# Patient Record
Sex: Male | Born: 1950 | Race: Black or African American | Hispanic: No | Marital: Married | Smoking: Former smoker
Health system: Southern US, Community
[De-identification: ages and names within clinical notes are randomized; demographics above are authoritative.]

## PROBLEM LIST (undated history)

## (undated) DIAGNOSIS — E119 Type 2 diabetes mellitus without complications: Secondary | ICD-10-CM

## (undated) DIAGNOSIS — T148XXA Other injury of unspecified body region, initial encounter: Secondary | ICD-10-CM

## (undated) DIAGNOSIS — D649 Anemia, unspecified: Secondary | ICD-10-CM

## (undated) DIAGNOSIS — E440 Moderate protein-calorie malnutrition: Secondary | ICD-10-CM

## (undated) DIAGNOSIS — N189 Chronic kidney disease, unspecified: Secondary | ICD-10-CM

## (undated) DIAGNOSIS — R197 Diarrhea, unspecified: Secondary | ICD-10-CM

## (undated) DIAGNOSIS — K30 Functional dyspepsia: Secondary | ICD-10-CM

## (undated) DIAGNOSIS — C9000 Multiple myeloma not having achieved remission: Secondary | ICD-10-CM

## (undated) DIAGNOSIS — F1910 Other psychoactive substance abuse, uncomplicated: Secondary | ICD-10-CM

## (undated) DIAGNOSIS — M719 Bursopathy, unspecified: Secondary | ICD-10-CM

## (undated) DIAGNOSIS — R569 Unspecified convulsions: Secondary | ICD-10-CM

## (undated) DIAGNOSIS — T68XXXA Hypothermia, initial encounter: Secondary | ICD-10-CM

## (undated) DIAGNOSIS — N179 Acute kidney failure, unspecified: Secondary | ICD-10-CM

## (undated) DIAGNOSIS — B351 Tinea unguium: Secondary | ICD-10-CM

## (undated) DIAGNOSIS — E1151 Type 2 diabetes mellitus with diabetic peripheral angiopathy without gangrene: Secondary | ICD-10-CM

## (undated) DIAGNOSIS — N183 Chronic kidney disease, stage 3 (moderate): Secondary | ICD-10-CM

## (undated) DIAGNOSIS — Z89519 Acquired absence of unspecified leg below knee: Secondary | ICD-10-CM

## (undated) DIAGNOSIS — C801 Malignant (primary) neoplasm, unspecified: Secondary | ICD-10-CM

## (undated) DIAGNOSIS — G9340 Encephalopathy, unspecified: Secondary | ICD-10-CM

## (undated) DIAGNOSIS — I509 Heart failure, unspecified: Secondary | ICD-10-CM

## (undated) DIAGNOSIS — Z8619 Personal history of other infectious and parasitic diseases: Secondary | ICD-10-CM

## (undated) DIAGNOSIS — I10 Essential (primary) hypertension: Secondary | ICD-10-CM

## (undated) DIAGNOSIS — R809 Proteinuria, unspecified: Secondary | ICD-10-CM

## (undated) DIAGNOSIS — K5792 Diverticulitis of intestine, part unspecified, without perforation or abscess without bleeding: Secondary | ICD-10-CM

## (undated) DIAGNOSIS — Z87442 Personal history of urinary calculi: Secondary | ICD-10-CM

## (undated) DIAGNOSIS — G546 Phantom limb syndrome with pain: Secondary | ICD-10-CM

## (undated) DIAGNOSIS — Q8901 Asplenia (congenital): Secondary | ICD-10-CM

## (undated) DIAGNOSIS — R7989 Other specified abnormal findings of blood chemistry: Secondary | ICD-10-CM

## (undated) DIAGNOSIS — L089 Local infection of the skin and subcutaneous tissue, unspecified: Secondary | ICD-10-CM

## (undated) DIAGNOSIS — M1990 Unspecified osteoarthritis, unspecified site: Secondary | ICD-10-CM

## (undated) HISTORY — DX: Acute kidney failure, unspecified: N17.9

## (undated) HISTORY — DX: Essential (primary) hypertension: I10

## (undated) HISTORY — DX: Local infection of the skin and subcutaneous tissue, unspecified: L08.9

## (undated) HISTORY — DX: Encephalopathy, unspecified: G93.40

## (undated) HISTORY — DX: Diverticulitis of intestine, part unspecified, without perforation or abscess without bleeding: K57.92

## (undated) HISTORY — DX: Type 2 diabetes mellitus without complications: E11.9

## (undated) HISTORY — DX: Chronic kidney disease, unspecified: N18.9

## (undated) HISTORY — DX: Chronic kidney disease, stage 3 (moderate): N18.3

## (undated) HISTORY — PX: COLONOSCOPY W/ POLYPECTOMY: SHX1380

## (undated) HISTORY — DX: Personal history of other infectious and parasitic diseases: Z86.19

## (undated) HISTORY — DX: Tinea unguium: B35.1

## (undated) HISTORY — DX: Phantom limb syndrome with pain: G54.6

## (undated) HISTORY — DX: Proteinuria, unspecified: R80.9

## (undated) HISTORY — DX: Other specified abnormal findings of blood chemistry: R79.89

## (undated) HISTORY — DX: Type 2 diabetes mellitus with diabetic peripheral angiopathy without gangrene: E11.51

## (undated) HISTORY — DX: Acquired absence of unspecified leg below knee: Z89.519

## (undated) HISTORY — DX: Heart failure, unspecified: I50.9

## (undated) HISTORY — DX: Other injury of unspecified body region, initial encounter: T14.8XXA

## (undated) HISTORY — DX: Moderate protein-calorie malnutrition: E44.0

## (undated) HISTORY — DX: Hypothermia, initial encounter: T68.XXXA

## (undated) HISTORY — PX: SPLENECTOMY: SUR1306

## (undated) HISTORY — DX: Other psychoactive substance abuse, uncomplicated: F19.10

## (undated) HISTORY — DX: Anemia, unspecified: D64.9

---

## 1987-08-31 HISTORY — PX: COLON SURGERY: SHX602

## 1998-04-03 ENCOUNTER — Emergency Department (HOSPITAL_COMMUNITY): Admission: EM | Admit: 1998-04-03 | Discharge: 1998-04-03 | Payer: Self-pay

## 1998-04-11 ENCOUNTER — Encounter: Admission: RE | Admit: 1998-04-11 | Discharge: 1998-07-10 | Payer: Self-pay | Admitting: Endocrinology

## 1999-07-25 ENCOUNTER — Encounter: Payer: Self-pay | Admitting: Emergency Medicine

## 1999-07-25 ENCOUNTER — Emergency Department (HOSPITAL_COMMUNITY): Admission: EM | Admit: 1999-07-25 | Discharge: 1999-07-25 | Payer: Self-pay | Admitting: Emergency Medicine

## 2002-08-13 ENCOUNTER — Emergency Department (HOSPITAL_COMMUNITY): Admission: EM | Admit: 2002-08-13 | Discharge: 2002-08-13 | Payer: Self-pay | Admitting: Emergency Medicine

## 2005-03-09 ENCOUNTER — Inpatient Hospital Stay (HOSPITAL_COMMUNITY): Admission: EM | Admit: 2005-03-09 | Discharge: 2005-03-11 | Payer: Self-pay | Admitting: Emergency Medicine

## 2013-08-30 DIAGNOSIS — R569 Unspecified convulsions: Secondary | ICD-10-CM

## 2013-08-30 HISTORY — DX: Unspecified convulsions: R56.9

## 2013-09-26 ENCOUNTER — Inpatient Hospital Stay (HOSPITAL_COMMUNITY)
Admission: AD | Admit: 2013-09-26 | Discharge: 2013-10-09 | DRG: 853 | Disposition: A | Discharge status: Rehabilitation Facility | Payer: Medicaid Other | Source: Ambulatory Visit | Attending: Internal Medicine | Admitting: Internal Medicine

## 2013-09-26 ENCOUNTER — Ambulatory Visit: Payer: Self-pay | Admitting: Family Medicine

## 2013-09-26 VITALS — BP 112/58 | HR 105 | Temp 102.9°F | Resp 16 | Ht 73.0 in | Wt 206.0 lb

## 2013-09-26 DIAGNOSIS — E1159 Type 2 diabetes mellitus with other circulatory complications: Secondary | ICD-10-CM | POA: Diagnosis present

## 2013-09-26 DIAGNOSIS — G5790 Unspecified mononeuropathy of unspecified lower limb: Secondary | ICD-10-CM | POA: Diagnosis present

## 2013-09-26 DIAGNOSIS — E1142 Type 2 diabetes mellitus with diabetic polyneuropathy: Secondary | ICD-10-CM | POA: Diagnosis present

## 2013-09-26 DIAGNOSIS — L0291 Cutaneous abscess, unspecified: Secondary | ICD-10-CM

## 2013-09-26 DIAGNOSIS — M795 Residual foreign body in soft tissue: Secondary | ICD-10-CM | POA: Diagnosis present

## 2013-09-26 DIAGNOSIS — F17200 Nicotine dependence, unspecified, uncomplicated: Secondary | ICD-10-CM | POA: Diagnosis present

## 2013-09-26 DIAGNOSIS — IMO0002 Reserved for concepts with insufficient information to code with codable children: Secondary | ICD-10-CM

## 2013-09-26 DIAGNOSIS — M726 Necrotizing fasciitis: Secondary | ICD-10-CM | POA: Diagnosis present

## 2013-09-26 DIAGNOSIS — L03119 Cellulitis of unspecified part of limb: Secondary | ICD-10-CM

## 2013-09-26 DIAGNOSIS — E1165 Type 2 diabetes mellitus with hyperglycemia: Secondary | ICD-10-CM

## 2013-09-26 DIAGNOSIS — E1149 Type 2 diabetes mellitus with other diabetic neurological complication: Secondary | ICD-10-CM | POA: Diagnosis present

## 2013-09-26 DIAGNOSIS — L0390 Cellulitis, unspecified: Secondary | ICD-10-CM

## 2013-09-26 DIAGNOSIS — D638 Anemia in other chronic diseases classified elsewhere: Secondary | ICD-10-CM | POA: Diagnosis present

## 2013-09-26 DIAGNOSIS — E876 Hypokalemia: Secondary | ICD-10-CM

## 2013-09-26 DIAGNOSIS — A419 Sepsis, unspecified organism: Principal | ICD-10-CM

## 2013-09-26 DIAGNOSIS — L02612 Cutaneous abscess of left foot: Secondary | ICD-10-CM

## 2013-09-26 DIAGNOSIS — R509 Fever, unspecified: Secondary | ICD-10-CM

## 2013-09-26 DIAGNOSIS — D72829 Elevated white blood cell count, unspecified: Secondary | ICD-10-CM

## 2013-09-26 DIAGNOSIS — L02619 Cutaneous abscess of unspecified foot: Secondary | ICD-10-CM

## 2013-09-26 DIAGNOSIS — I10 Essential (primary) hypertension: Secondary | ICD-10-CM | POA: Diagnosis present

## 2013-09-26 DIAGNOSIS — E46 Unspecified protein-calorie malnutrition: Secondary | ICD-10-CM | POA: Diagnosis present

## 2013-09-26 DIAGNOSIS — D649 Anemia, unspecified: Secondary | ICD-10-CM

## 2013-09-26 DIAGNOSIS — Z8719 Personal history of other diseases of the digestive system: Secondary | ICD-10-CM

## 2013-09-26 DIAGNOSIS — Z1810 Retained metal fragments, unspecified: Secondary | ICD-10-CM

## 2013-09-26 DIAGNOSIS — E119 Type 2 diabetes mellitus without complications: Secondary | ICD-10-CM

## 2013-09-26 DIAGNOSIS — IMO0001 Reserved for inherently not codable concepts without codable children: Secondary | ICD-10-CM | POA: Diagnosis present

## 2013-09-26 DIAGNOSIS — Z79899 Other long term (current) drug therapy: Secondary | ICD-10-CM

## 2013-09-26 DIAGNOSIS — G5690 Unspecified mononeuropathy of unspecified upper limb: Secondary | ICD-10-CM | POA: Diagnosis present

## 2013-09-26 DIAGNOSIS — E118 Type 2 diabetes mellitus with unspecified complications: Secondary | ICD-10-CM

## 2013-09-26 LAB — POCT CBC
Granulocyte percent: 88.6 %G — AB (ref 37–80)
HCT, POC: 34.4 % — AB (ref 43.5–53.7)
Hemoglobin: 10.4 g/dL — AB (ref 14.1–18.1)
Lymph, poc: 1.8 (ref 0.6–3.4)
MCH, POC: 29.5 pg (ref 27–31.2)
MCHC: 30.2 g/dL — AB (ref 31.8–35.4)
MCV: 97.5 fL — AB (ref 80–97)
MID (cbc): 1.3 — AB (ref 0–0.9)
MPV: 8.8 fL (ref 0–99.8)
POC Granulocyte: 23.7 — AB (ref 2–6.9)
POC LYMPH PERCENT: 6.6 %L — AB (ref 10–50)
POC MID %: 4.8 %M (ref 0–12)
Platelet Count, POC: 409 10*3/uL (ref 142–424)
RBC: 3.53 M/uL — AB (ref 4.69–6.13)
RDW, POC: 12.8 %
WBC: 26.8 10*3/uL — AB (ref 4.6–10.2)

## 2013-09-26 LAB — BASIC METABOLIC PANEL
BUN: 14 mg/dL (ref 6–23)
CO2: 25 mEq/L (ref 19–32)
Calcium: 8.5 mg/dL (ref 8.4–10.5)
Chloride: 92 mEq/L — ABNORMAL LOW (ref 96–112)
Creatinine, Ser: 0.81 mg/dL (ref 0.50–1.35)
GFR calc Af Amer: >90 mL/min (ref >90)
GFR calc non Af Amer: >90 mL/min (ref >90)
Glucose, Bld: 336 mg/dL — ABNORMAL HIGH (ref 70–99)
Potassium: 3.4 mEq/L — ABNORMAL LOW (ref 3.7–5.3)
Sodium: 134 mEq/L — ABNORMAL LOW (ref 137–147)

## 2013-09-26 LAB — CBC
HCT: 33.3 % — ABNORMAL LOW (ref 39.0–52.0)
Hemoglobin: 11.3 g/dL — ABNORMAL LOW (ref 13.0–17.0)
MCH: 31 pg (ref 26.0–34.0)
MCHC: 33.9 g/dL (ref 30.0–36.0)
MCV: 91.2 fL (ref 78.0–100.0)
Platelets: 463 10*3/uL — ABNORMAL HIGH (ref 150–400)
RBC: 3.65 MIL/uL — ABNORMAL LOW (ref 4.22–5.81)
RDW: 13 % (ref 11.5–15.5)
WBC: 34.5 10*3/uL — ABNORMAL HIGH (ref 4.0–10.5)

## 2013-09-26 LAB — PROTIME-INR
INR: 1.09 (ref 0.00–1.49)
Prothrombin Time: 13.9 seconds (ref 11.6–15.2)

## 2013-09-26 LAB — LACTIC ACID, PLASMA: Lactic Acid, Venous: 1.4 mmol/L (ref 0.5–2.2)

## 2013-09-26 LAB — GLUCOSE, CAPILLARY: Glucose-Capillary: 310 mg/dL — ABNORMAL HIGH (ref 70–99)

## 2013-09-26 LAB — GLUCOSE, POCT (MANUAL RESULT ENTRY): POC Glucose: 359 mg/dl — AB (ref 70–99)

## 2013-09-26 MED ORDER — INSULIN ASPART 100 UNIT/ML ~~LOC~~ SOLN
0.0000 [IU] | Freq: Three times a day (TID) | SUBCUTANEOUS | Status: DC
  Administered 2013-09-27: 5 [IU] via SUBCUTANEOUS
  Administered 2013-09-27: 3 [IU] via SUBCUTANEOUS
  Administered 2013-09-27: 8 [IU] via SUBCUTANEOUS
  Administered 2013-09-28: 3 [IU] via SUBCUTANEOUS
  Administered 2013-09-28: 5 [IU] via SUBCUTANEOUS
  Administered 2013-09-28: 2 [IU] via SUBCUTANEOUS
  Administered 2013-09-29: 3 [IU] via SUBCUTANEOUS
  Administered 2013-09-29 – 2013-09-30 (×3): 5 [IU] via SUBCUTANEOUS
  Administered 2013-09-30: 3 [IU] via SUBCUTANEOUS
  Administered 2013-09-30: 8 [IU] via SUBCUTANEOUS
  Administered 2013-10-01: 2 [IU] via SUBCUTANEOUS
  Administered 2013-10-01: 5 [IU] via SUBCUTANEOUS
  Administered 2013-10-01: 2 [IU] via SUBCUTANEOUS
  Administered 2013-10-02: 3 [IU] via SUBCUTANEOUS
  Administered 2013-10-03 – 2013-10-08 (×6): 2 [IU] via SUBCUTANEOUS
  Administered 2013-10-08 – 2013-10-09 (×3): 3 [IU] via SUBCUTANEOUS

## 2013-09-26 MED ORDER — ALUM & MAG HYDROXIDE-SIMETH 200-200-20 MG/5ML PO SUSP
30.0000 mL | Freq: Four times a day (QID) | ORAL | Status: DC | PRN
  Administered 2013-10-06 – 2013-10-08 (×4): 30 mL via ORAL
  Filled 2013-09-26 (×4): qty 30

## 2013-09-26 MED ORDER — INSULIN GLARGINE 100 UNIT/ML ~~LOC~~ SOLN
20.0000 [IU] | Freq: Every day | SUBCUTANEOUS | Status: DC
Start: 2013-09-26 — End: 2013-09-29
  Administered 2013-09-26 – 2013-09-28 (×3): 20 [IU] via SUBCUTANEOUS
  Filled 2013-09-26 (×4): qty 0.2

## 2013-09-26 MED ORDER — BISACODYL 10 MG RE SUPP: 10.0000 mg | Freq: Every day | RECTAL | Status: DC | PRN

## 2013-09-26 MED ORDER — SODIUM CHLORIDE 0.9 % IV SOLN
INTRAVENOUS | Status: DC
  Administered 2013-09-26 – 2013-09-29 (×4): via INTRAVENOUS

## 2013-09-26 MED ORDER — ONDANSETRON HCL 4 MG PO TABS: 4.0000 mg | ORAL_TABLET | Freq: Four times a day (QID) | ORAL | Status: DC | PRN

## 2013-09-26 MED ORDER — PIPERACILLIN-TAZOBACTAM 3.375 G IVPB
3.3750 g | Freq: Three times a day (TID) | INTRAVENOUS | Status: DC
  Administered 2013-09-27 (×2): 3.375 g via INTRAVENOUS
  Filled 2013-09-26 (×3): qty 50

## 2013-09-26 MED ORDER — OXYCODONE HCL 5 MG PO TABS
5.0000 mg | ORAL_TABLET | ORAL | Status: DC | PRN
  Administered 2013-09-28 – 2013-10-09 (×38): 5 mg via ORAL
  Filled 2013-09-26 (×37): qty 1

## 2013-09-26 MED ORDER — ONDANSETRON HCL 4 MG/2ML IJ SOLN
4.0000 mg | Freq: Four times a day (QID) | INTRAMUSCULAR | Status: DC | PRN
  Administered 2013-09-29 – 2013-10-04 (×2): 4 mg via INTRAVENOUS
  Filled 2013-09-26 (×2): qty 2

## 2013-09-26 MED ORDER — ACETAMINOPHEN 650 MG RE SUPP: 650.0000 mg | Freq: Four times a day (QID) | RECTAL | Status: DC | PRN

## 2013-09-26 MED ORDER — ACETAMINOPHEN 325 MG PO TABS
650.0000 mg | ORAL_TABLET | Freq: Four times a day (QID) | ORAL | Status: DC | PRN
  Administered 2013-09-26 – 2013-09-28 (×2): 650 mg via ORAL
  Filled 2013-09-26 (×2): qty 2

## 2013-09-26 MED ORDER — INSULIN ASPART 100 UNIT/ML ~~LOC~~ SOLN
0.0000 [IU] | Freq: Every day | SUBCUTANEOUS | Status: DC
  Administered 2013-09-26: 4 [IU] via SUBCUTANEOUS
  Administered 2013-09-28: 3 [IU] via SUBCUTANEOUS

## 2013-09-26 MED ORDER — MORPHINE SULFATE 2 MG/ML IJ SOLN
2.0000 mg | INTRAMUSCULAR | Status: DC | PRN
  Administered 2013-09-27 – 2013-10-09 (×21): 2 mg via INTRAVENOUS
  Filled 2013-09-26 (×21): qty 1

## 2013-09-26 MED ORDER — ENOXAPARIN SODIUM 40 MG/0.4ML ~~LOC~~ SOLN
40.0000 mg | SUBCUTANEOUS | Status: DC
Start: 2013-09-27 — End: 2013-09-27
  Filled 2013-09-26: qty 0.4

## 2013-09-26 MED ORDER — HYDROMORPHONE HCL PF 1 MG/ML IJ SOLN: 1.0000 mg | Freq: Once | INTRAMUSCULAR | Status: DC
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
## 2013-09-26 NOTE — Patient Instructions (Addendum)
Go directly to [facility] for admission with the [facility] (Dr. Coralyn Pear), room 1506, on [address]. Follow the signs to Admissions. There is free valet parking, and they will bring a wheelchair if you let them know he needs one. Go to the admissions desk and tell them he is there for direct admission.

## 2013-09-26 NOTE — Progress Notes (Signed)
Case discussed w/ Dellis Filbert - agree that pt needs hospitalization for sepsis from cellulitis and uncontrolled DM.  However, is clinically stable so agree w/ transfer by private vehicle for direct admission - accepted to med surg bed by [facility].  Reviewed documentation and agree w/ assessment and plan. Delman Cheadle, MD MPH
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
## 2013-09-27 ENCOUNTER — Encounter (HOSPITAL_COMMUNITY): Payer: Self-pay | Admitting: *Deleted

## 2013-09-27 ENCOUNTER — Encounter (HOSPITAL_COMMUNITY)
Admission: AD | Disposition: A | Discharge status: Rehabilitation Facility | Payer: Self-pay | Source: Ambulatory Visit | Attending: Internal Medicine

## 2013-09-27 ENCOUNTER — Inpatient Hospital Stay (HOSPITAL_COMMUNITY): Payer: Medicaid Other

## 2013-09-27 ENCOUNTER — Encounter (HOSPITAL_COMMUNITY): Payer: Medicaid Other | Admitting: Anesthesiology

## 2013-09-27 ENCOUNTER — Inpatient Hospital Stay (HOSPITAL_COMMUNITY): Payer: Medicaid Other | Admitting: Anesthesiology

## 2013-09-27 DIAGNOSIS — E876 Hypokalemia: Secondary | ICD-10-CM | POA: Diagnosis present

## 2013-09-27 HISTORY — PX: I & D EXTREMITY: SHX5045

## 2013-09-27 LAB — HEMOGLOBIN A1C
Hgb A1c MFr Bld: 13.7 % — ABNORMAL HIGH (ref <5.7)
Mean Plasma Glucose: 346 mg/dL — ABNORMAL HIGH (ref <117)

## 2013-09-27 LAB — COMPREHENSIVE METABOLIC PANEL
ALT: 26 U/L (ref 0–53)
AST: 22 U/L (ref 0–37)
Albumin: 2.1 g/dL — ABNORMAL LOW (ref 3.5–5.2)
Alkaline Phosphatase: 182 U/L — ABNORMAL HIGH (ref 39–117)
BUN: 15 mg/dL (ref 6–23)
CO2: 26 mEq/L (ref 19–32)
Calcium: 8.5 mg/dL (ref 8.4–10.5)
Chloride: 93 mEq/L — ABNORMAL LOW (ref 96–112)
Creatinine, Ser: 0.94 mg/dL (ref 0.50–1.35)
GFR calc Af Amer: >90 mL/min (ref >90)
GFR calc non Af Amer: 88 mL/min — ABNORMAL LOW (ref >90)
Glucose, Bld: 310 mg/dL — ABNORMAL HIGH (ref 70–99)
Potassium: 3.3 mEq/L — ABNORMAL LOW (ref 3.7–5.3)
Sodium: 134 mEq/L — ABNORMAL LOW (ref 137–147)
Total Bilirubin: 0.5 mg/dL (ref 0.3–1.2)
Total Protein: 6.6 g/dL (ref 6.0–8.3)

## 2013-09-27 LAB — GLUCOSE, CAPILLARY
Glucose-Capillary: 281 mg/dL — ABNORMAL HIGH (ref 70–99)
Glucose-Capillary: 188 mg/dL — ABNORMAL HIGH (ref 70–99)
Glucose-Capillary: 207 mg/dL — ABNORMAL HIGH (ref 70–99)
Glucose-Capillary: 150 mg/dL — ABNORMAL HIGH (ref 70–99)

## 2013-09-27 LAB — GRAM STAIN: Gram Stain: NONE SEEN

## 2013-09-27 LAB — CBC
HCT: 33 % — ABNORMAL LOW (ref 39.0–52.0)
Hemoglobin: 11.1 g/dL — ABNORMAL LOW (ref 13.0–17.0)
MCH: 30.7 pg (ref 26.0–34.0)
MCHC: 33.6 g/dL (ref 30.0–36.0)
MCV: 91.4 fL (ref 78.0–100.0)
Platelets: 471 10*3/uL — ABNORMAL HIGH (ref 150–400)
RBC: 3.61 MIL/uL — ABNORMAL LOW (ref 4.22–5.81)
RDW: 13 % (ref 11.5–15.5)
WBC: 37.3 10*3/uL — ABNORMAL HIGH (ref 4.0–10.5)

## 2013-09-27 LAB — TSH: TSH: 0.901 u[IU]/mL (ref 0.350–4.500)

## 2013-09-27 LAB — SURGICAL PCR SCREEN
MRSA, PCR: NEGATIVE
Staphylococcus aureus: POSITIVE — AB

## 2013-09-27 SURGERY — IRRIGATION AND DEBRIDEMENT EXTREMITY
Anesthesia: General | Site: Foot | Laterality: Left

## 2013-09-27 MED ORDER — FENTANYL CITRATE 0.05 MG/ML IJ SOLN
INTRAMUSCULAR | Status: AC
  Filled 2013-09-27: qty 2

## 2013-09-27 MED ORDER — FENTANYL CITRATE 0.05 MG/ML IJ SOLN
INTRAMUSCULAR | Status: DC | PRN
  Administered 2013-09-27 (×3): 50 ug via INTRAVENOUS

## 2013-09-27 MED ORDER — CEFAZOLIN SODIUM-DEXTROSE 2-3 GM-% IV SOLR
INTRAVENOUS | Status: DC | PRN
  Administered 2013-09-27: 2 g via INTRAVENOUS

## 2013-09-27 MED ORDER — LIDOCAINE HCL (CARDIAC) 20 MG/ML IV SOLN
INTRAVENOUS | Status: AC
  Filled 2013-09-27: qty 5

## 2013-09-27 MED ORDER — PROPOFOL 10 MG/ML IV BOLUS
INTRAVENOUS | Status: AC
  Filled 2013-09-27: qty 20

## 2013-09-27 MED ORDER — VANCOMYCIN HCL 10 G IV SOLR
1250.0000 mg | Freq: Two times a day (BID) | INTRAVENOUS | Status: DC
  Administered 2013-09-27 – 2013-09-29 (×6): 1250 mg via INTRAVENOUS
  Filled 2013-09-27 (×7): qty 1250

## 2013-09-27 MED ORDER — ONDANSETRON HCL 4 MG/2ML IJ SOLN
INTRAMUSCULAR | Status: DC | PRN
  Administered 2013-09-27: 4 mg via INTRAVENOUS

## 2013-09-27 MED ORDER — PHENYLEPHRINE HCL 10 MG/ML IJ SOLN
INTRAMUSCULAR | Status: DC | PRN
  Administered 2013-09-27: 80 ug via INTRAVENOUS
  Administered 2013-09-27 (×2): 40 ug via INTRAVENOUS

## 2013-09-27 MED ORDER — CEFAZOLIN SODIUM-DEXTROSE 2-3 GM-% IV SOLR
INTRAVENOUS | Status: AC
  Filled 2013-09-27: qty 50

## 2013-09-27 MED ORDER — SUCCINYLCHOLINE CHLORIDE 20 MG/ML IJ SOLN
INTRAMUSCULAR | Status: DC | PRN
  Administered 2013-09-27: 100 mg via INTRAVENOUS

## 2013-09-27 MED ORDER — MIDAZOLAM HCL 2 MG/2ML IJ SOLN
INTRAMUSCULAR | Status: AC
  Filled 2013-09-27: qty 2

## 2013-09-27 MED ORDER — PROPOFOL 10 MG/ML IV BOLUS
INTRAVENOUS | Status: DC | PRN
  Administered 2013-09-27: 180 mg via INTRAVENOUS

## 2013-09-27 MED ORDER — LIDOCAINE HCL (CARDIAC) 20 MG/ML IV SOLN
INTRAVENOUS | Status: DC | PRN
  Administered 2013-09-27: 50 mg via INTRAVENOUS

## 2013-09-27 MED ORDER — DEXTROSE 5 % IV SOLN
2.0000 g | Freq: Three times a day (TID) | INTRAVENOUS | Status: DC
  Administered 2013-09-27 – 2013-10-03 (×16): 2 g via INTRAVENOUS
  Filled 2013-09-27 (×19): qty 2

## 2013-09-27 MED ORDER — ONDANSETRON HCL 4 MG/2ML IJ SOLN
INTRAMUSCULAR | Status: AC
  Filled 2013-09-27: qty 2

## 2013-09-27 MED ORDER — MIDAZOLAM HCL 5 MG/5ML IJ SOLN
INTRAMUSCULAR | Status: DC | PRN
  Administered 2013-09-27: 2 mg via INTRAVENOUS

## 2013-09-27 MED ORDER — 0.9 % SODIUM CHLORIDE (POUR BTL) OPTIME
TOPICAL | Status: DC | PRN
  Administered 2013-09-27: 1000 mL

## 2013-09-27 MED ORDER — FENTANYL CITRATE 0.05 MG/ML IJ SOLN
25.0000 ug | INTRAMUSCULAR | Status: DC | PRN
Start: 2013-09-27 — End: 2013-10-02
  Administered 2013-09-27 (×2): 25 ug via INTRAVENOUS
  Administered 2013-09-27 – 2013-09-28 (×2): 50 ug via INTRAVENOUS
  Filled 2013-09-27 (×2): qty 2

## 2013-09-27 MED ORDER — POTASSIUM CHLORIDE CRYS ER 20 MEQ PO TBCR
40.0000 meq | EXTENDED_RELEASE_TABLET | Freq: Once | ORAL | Status: AC
  Administered 2013-09-27: 40 meq via ORAL
  Filled 2013-09-27: qty 2

## 2013-09-27 MED ORDER — GADOBENATE DIMEGLUMINE 529 MG/ML IV SOLN
20.0000 mL | Freq: Once | INTRAVENOUS | Status: AC | PRN
  Administered 2013-09-27: 19 mL via INTRAVENOUS

## 2013-09-27 MED ORDER — INSULIN ASPART 100 UNIT/ML ~~LOC~~ SOLN
SUBCUTANEOUS | Status: AC
  Filled 2013-09-27: qty 1

## 2013-09-27 MED ORDER — BUPIVACAINE HCL (PF) 0.5 % IJ SOLN
INTRAMUSCULAR | Status: AC
  Filled 2013-09-27: qty 30

## 2013-09-27 MED ORDER — SODIUM CHLORIDE 0.9 % IR SOLN
Status: DC | PRN
  Administered 2013-09-27: 9000 mL

## 2013-09-27 MED ORDER — PROMETHAZINE HCL 25 MG/ML IJ SOLN: 6.2500 mg | INTRAMUSCULAR | Status: DC | PRN

## 2013-09-27 MED ORDER — PHENYLEPHRINE 40 MCG/ML (10ML) SYRINGE FOR IV PUSH (FOR BLOOD PRESSURE SUPPORT)
PREFILLED_SYRINGE | INTRAVENOUS | Status: AC
Start: 2013-09-27 — End: 2013-09-27
  Filled 2013-09-27: qty 10

## 2013-09-27 MED ORDER — INSULIN ASPART 100 UNIT/ML ~~LOC~~ SOLN
SUBCUTANEOUS | Status: AC
Start: 2013-09-27 — End: 2013-09-27
  Filled 2013-09-27: qty 1

## 2013-09-27 SURGICAL SUPPLY — 47 items
ADH SKN CLS APL DERMABOND .7 (GAUZE/BANDAGES/DRESSINGS)
BAG SPEC THK2 15X12 ZIP CLS (MISCELLANEOUS) ×1
BAG ZIPLOCK 12X15 (MISCELLANEOUS) ×2 IMPLANT
BANDAGE ELASTIC 6 VELCRO ST LF (GAUZE/BANDAGES/DRESSINGS) ×1 IMPLANT
BANDAGE ESMARK 6X9 LF (GAUZE/BANDAGES/DRESSINGS) ×1 IMPLANT
BNDG CMPR 9X6 STRL LF SNTH (GAUZE/BANDAGES/DRESSINGS) ×1
BNDG COHESIVE 6X5 TAN STRL LF (GAUZE/BANDAGES/DRESSINGS) ×1 IMPLANT
BNDG ESMARK 6X9 LF (GAUZE/BANDAGES/DRESSINGS) ×2
BNDG GAUZE ELAST 4 BULKY (GAUZE/BANDAGES/DRESSINGS) ×3 IMPLANT
CONT SPECI 4OZ STER CLIK (MISCELLANEOUS) ×1 IMPLANT
CUFF TOURN SGL QUICK 34 (TOURNIQUET CUFF) ×2
CUFF TRNQT CYL 34X4X40X1 (TOURNIQUET CUFF) ×1 IMPLANT
DERMABOND ADVANCED (GAUZE/BANDAGES/DRESSINGS)
DERMABOND ADVANCED .7 DNX12 (GAUZE/BANDAGES/DRESSINGS) ×1 IMPLANT
DRAPE EXTREMITY T 121X128X90 (DRAPE) ×2 IMPLANT
DRSG ADAPTIC 3X8 NADH LF (GAUZE/BANDAGES/DRESSINGS) ×1 IMPLANT
DRSG AQUACEL AG ADV 3.5X10 (GAUZE/BANDAGES/DRESSINGS) ×1 IMPLANT
DRSG TEGADERM 4X4.75 (GAUZE/BANDAGES/DRESSINGS) ×1 IMPLANT
DURAPREP 26ML APPLICATOR (WOUND CARE) ×2 IMPLANT
ELECT REM PT RETURN 9FT ADLT (ELECTROSURGICAL) ×2
ELECTRODE REM PT RTRN 9FT ADLT (ELECTROSURGICAL) ×1 IMPLANT
EVACUATOR 1/8 PVC DRAIN (DRAIN) ×1 IMPLANT
GAUZE SPONGE 2X2 8PLY STRL LF (GAUZE/BANDAGES/DRESSINGS) ×1 IMPLANT
GLOVE BIO SURGEON STRL SZ7.5 (GLOVE) ×3 IMPLANT
GLOVE BIOGEL PI IND STRL 8 (GLOVE) ×2 IMPLANT
GLOVE BIOGEL PI INDICATOR 8 (GLOVE) ×2
GLOVE ECLIPSE 8.0 STRL XLNG CF (GLOVE) ×2 IMPLANT
GOWN SPEC L3 XXLG W/TWL (GOWN DISPOSABLE) ×4 IMPLANT
GOWN STRL REUS W/TWL LRG LVL3 (GOWN DISPOSABLE) ×2 IMPLANT
HANDPIECE INTERPULSE COAX TIP (DISPOSABLE) ×2
KIT BASIN OR (CUSTOM PROCEDURE TRAY) ×2 IMPLANT
MANIFOLD NEPTUNE II (INSTRUMENTS) ×2 IMPLANT
PACK TOTAL JOINT (CUSTOM PROCEDURE TRAY) ×2 IMPLANT
PAD ABD 8X10 STRL (GAUZE/BANDAGES/DRESSINGS) ×3 IMPLANT
PADDING CAST COTTON 6X4 STRL (CAST SUPPLIES) ×1 IMPLANT
POSITIONER SURGICAL ARM (MISCELLANEOUS) ×2 IMPLANT
SET HNDPC FAN SPRY TIP SCT (DISPOSABLE) ×1 IMPLANT
SPONGE GAUZE 2X2 STER 10/PKG (GAUZE/BANDAGES/DRESSINGS)
SPONGE GAUZE 4X4 12PLY (GAUZE/BANDAGES/DRESSINGS) ×1 IMPLANT
STAPLER VISISTAT 35W (STAPLE) ×1 IMPLANT
SUT MNCRL AB 4-0 PS2 18 (SUTURE) ×2 IMPLANT
SUT VIC AB 1 CT1 36 (SUTURE) ×4 IMPLANT
SUT VIC AB 2-0 CT1 27 (SUTURE) ×6
SUT VIC AB 2-0 CT1 TAPERPNT 27 (SUTURE) ×3 IMPLANT
SWAB COLLECTION DEVICE MRSA (MISCELLANEOUS) ×2 IMPLANT
TOWEL OR 17X26 10 PK STRL BLUE (TOWEL DISPOSABLE) ×3 IMPLANT
TUBE ANAEROBIC SPECIMEN COL (MISCELLANEOUS) ×2 IMPLANT
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
## 2013-09-27 NOTE — Anesthesia Preprocedure Evaluation (Addendum)
Anesthesia Evaluation  Patient identified by MRN, date of birth, ID band Patient awake    Reviewed: Allergy & Precautions, H&P , NPO status , Patient's Chart, lab work & pertinent test results  Airway Mallampati: II TM Distance: >3 FB Neck ROM: Full    Dental no notable dental hx.    Pulmonary Current Smoker,  breath sounds clear to auscultation  Pulmonary exam normal       Cardiovascular negative cardio ROS  Rhythm:Regular Rate:Normal     Neuro/Psych negative neurological ROS  negative psych ROS   GI/Hepatic negative GI ROS, Neg liver ROS,   Endo/Other  diabetes, Poorly Controlled, Type 2, Insulin Dependent  Renal/GU negative Renal ROS  negative genitourinary   Musculoskeletal negative musculoskeletal ROS (+)   Abdominal   Peds negative pediatric ROS (+)  Hematology negative hematology ROS (+)   Anesthesia Other Findings   Reproductive/Obstetrics negative OB ROS                          Anesthesia Physical Anesthesia Plan  ASA: III and emergent  Anesthesia Plan: General   Post-op Pain Management:    Induction: Intravenous  Airway Management Planned: Oral ETT  Additional Equipment:   Intra-op Plan:   Post-operative Plan: Extubation in OR  Informed Consent: I have reviewed the patients History and Physical, chart, labs and discussed the procedure including the risks, benefits and alternatives for the proposed anesthesia with the patient or authorized representative who has indicated his/her understanding and acceptance.   Dental advisory given  Plan Discussed with: CRNA  Anesthesia Plan Comments:         Anesthesia Quick Evaluation

## 2013-09-27 NOTE — Progress Notes (Addendum)
[facility] PROGRESS NOTE  Mark [name] [id number] DOB: Jan 08, 1951 DOA: 09/26/2013 PCP: No PCP Per Patient   Brief narrative 63 y.o. male with a past medical history of poorly controlled type 2 diabetes mellitus, medication nonadherence, presents as a direct admit from the urgent care Center. He complained of a two-week history of left foot pain, swelling, erythema which has significantly worsened in the last 2-3 days. He reports associated subjective fevers, chills, malaise, and feeling quite ill. He was found to be febrile at the urgent care center having a temperature 102.7 with lab work showed a white count of 26.8 and a glucose of 359. Patient admitted for sepsis due to cellulitis of left foot.  Assessment/Plan: Sepsis due to Cellulitis of foot with abscess and concern for necrotizing fascitis Afebrile this am but wbc worsened to 37 k. On empiric abx IV vanco and zosyn ( switched to cefepime this am). Xray of foot showed foreign body between 2nd and third toe and large amount of gas tracking along the foot concerning for necrotizing fascitis. An MRI of  the foot was done which again showed cellulitis with multiple small abscesses and concern for necrotizing fascitis.  orthopedics consulted for I&D. Will keep him NPO  -IV hydration and pain control -check peripheral pulse with doppler   Uncontrolled DM A1C of 13.7 . reports not on any meds due to inability to afford. CM consulted. Will need insulin upon d/c. [facility] Added lantus and SSI.  Tobacco abuse counseled on cessation    Code Status: full  Family Communication:none Disposition Plan:pending managment   Consultants:  ortho  Procedures:  none  Antibiotics:  IV vanco and zosyn / cefepime ( 1/28>>)  HPI/Subjective: Reports foot pain better on pain meds  Objective: Filed Vitals:   09/27/13 0559  BP: 114/65  Pulse: 81  Temp: 99.5 F (37.5 C)  Resp: 17     Intake/Output Summary (Last 24 hours) at 09/27/13 1144 Last data filed at 09/27/13 0930  Gross per 24 hour  Intake      0 ml  Output    500 ml  Net   -500 ml   Filed Weights   09/26/13 2359  Weight: 93.441 kg (206 lb)    Exam:   General:  Elderly male lying in bed sleepy   HEENT: no pallor, moist oral mucosa  Chest: clear b/l, no added sounds  Abd: soft, NT, ND, BS+  Ext: swollen foot up to lower tibia, tender over distal metatarsal area. No ulceration. Left  tibial pulses not palpable clinically  CNS: AAOX3 Data Reviewed: Basic Metabolic Panel:  Recent Labs Lab 09/26/13 2315 09/27/13 0508  NA 134* 134*  K 3.4* 3.3*  CL 92* 93*  CO2 25 26  GLUCOSE 336* 310*  BUN 14 15  CREATININE 0.81 0.94  CALCIUM 8.5 8.5   Liver Function Tests:  Recent Labs Lab 09/27/13 0508  AST 22  ALT 26  ALKPHOS 182*  BILITOT 0.5  PROT 6.6  ALBUMIN 2.1*   No results found for this basename: LIPASE, AMYLASE,  in the last 168 hours No results found for this basename: AMMONIA,  in the last 168 hours CBC:  Recent Labs Lab 09/26/13 2315 09/27/13 0508  WBC 34.5* 37.3*  HGB 11.3* 11.1*  HCT 33.3* 33.0*  MCV 91.2 91.4  PLT 463* 471*   Cardiac Enzymes: No results found for this basename: CKTOTAL, CKMB, CKMBINDEX, TROPONINI,  in the last 168  hours BNP (last 3 results) No results found for this basename: PROBNP,  in the last 8760 hours CBG:  Recent Labs Lab 09/26/13 2317 09/27/13 0729  GLUCAP 310* 281*    No results found for this or any previous visit (from the past 240 hour(s)).   Studies: Mr Foot Left W Wo Contrast  09/27/2013   CLINICAL DATA:  Cellulitis and gangrene of the left foot. Question necrotizing fasciitis  EXAM: MRI OF THE LEFT FOREFOOT WITHOUT AND WITH CONTRAST  TECHNIQUE: Multiplanar, multisequence MR imaging was performed both before and after administration of intravenous contrast.  CONTRAST:  19 mL MULTIHANCE GADOBENATE DIMEGLUMINE 529 MG/ML IV  SOLN  COMPARISON:  Plain films left foot 09/27/2013 at 8:12 a.m.  FINDINGS: There is extensive artifact about the distal foot secondary to the radiopaque foreign body between the heads of the second and third metatarsals seen on plain films. Artifact limits evaluation of the second and third metatarsals and proximal phalanges of the second third toes. Given this limitation, no bone marrow signal abnormality to suggest osteomyelitis is identified.  There is extensive edema and enhancement about the foot consistent with cellulitis. Innumerable locules of gas are seen tracking in the soft tissues of the foot, worse medially, as seen on plain films. There is a large fluid collection in the plantar soft tissues with multiple lobules of gas. The collection measures 6.2 cm transverse by 2.3 cm craniocaudal by approximately 11 cm long. A second fluid collection in the deeper soft tissues appears centered in the abductor hallucis muscle belly and measures 6.2 cm long by up to 2.1 cm transverse by 1.4 cm craniocaudal. More distally there is a fluid collection between the first and second metatarsals measuring 1.8 cm transverse by 1.4 cm craniocaudal by approximately 2.1 cm long.  IMPRESSION: Intense cellulitis of the left foot with multiple abscesses identified. Innumerable nodules of gas within soft tissue are worrisome for necrotizing fasciitis.   Electronically Signed   By: Inge Rise M.D.   On: 09/27/2013 11:15   Dg Foot 2 Views Left  09/27/2013   CLINICAL DATA:  Osteomyelitis.  EXAM: LEFT FOOT - 2 VIEW  COMPARISON:  None.  FINDINGS: There is a radiopaque fragment which may represent a needle fragment or nail which projects over the plantar aspect of the second third metatarsal heads. This measures 11 mm in length and almost 2 mm in width. There is no focal osteolysis to suggest osteomyelitis however there is florid soft tissue swelling and gas tracking through the soft tissues of the foot, greater on the medial  side than lateral. The edema tracks into the distal leg, with infiltration of Kager's fat pad. Hallux valgus and first MTP joint osteoarthritis is mild. Midfoot osteoarthritis is present, most pronounced at the first and second tarsometatarsal junction.  IMPRESSION: 1. Radiopaque foreign body measuring 12 mm in length in the plantar aspect of the foot over the second third metatarsal heads. 2. Large amount of gas tracking along the foot with diffuse soft tissue swelling. The findings are concerning for necrotizing fasciitis. Prompt surgical consultation is recommended. Critical Value/emergent results were called by telephone at the time of interpretation on 09/27/2013 at 9:14 AM to Dr. Clementeen Graham , who verbally acknowledged these results.   Electronically Signed   By: Dereck Ligas M.D.   On: 09/27/2013 09:16    Scheduled Meds: . ceFEPime (MAXIPIME) IV  2 g Intravenous Q8H  .  HYDROmorphone (DILAUDID) injection  1 mg Intravenous Once  . insulin  aspart  0-15 Units Subcutaneous TID WC  . insulin aspart  0-5 Units Subcutaneous QHS  . insulin glargine  20 Units Subcutaneous QHS  . vancomycin  1,250 mg Intravenous Q12H   Continuous Infusions: . sodium chloride 125 mL/hr at 09/26/13 2332      Time spent: [place], [place]  [facility] Pager [phone] If 7PM-7AM, please contact night-coverage at [email or web address], password [facility] 09/27/2013, 11:44 AM  LOS: 1 day

## 2013-09-27 NOTE — Care Management Note (Unsigned)
    Page 1 of 1   10/09/2013     1:36:11 PM   CARE MANAGEMENT NOTE 10/09/2013  Patient:  Mark [name], Mark [name]   Account Number:  [id number]  Date Initiated:  09/27/2013  Documentation initiated by:  [facility]  Subjective/Objective Assessment:   63 year old male admitted with possible osteomyelitis of foot.     Action/Plan:   Needs assistance with medications.   Anticipated DC Date:  10/09/2013   Anticipated DC Plan:  IP REHAB FACILITY  In-house referral  Clinical Social Worker      DC Forensic scientist  CM consult  [facility]  Medication Assistance      Choice offered to / List presented to:             Status of service:  In process, will continue to follow Medicare Important Message given?  NA - LOS <3 / Initial given by admissions (If response is "NO", the following Medicare IM given date fields will be blank) Date Medicare IM given:   Date Additional Medicare IM given:    Discharge Disposition:    Per UR Regulation:  Reviewed for med. necessity/level of care/duration of stay  If discussed at [place] of Stay Meetings, dates discussed:    Comments:  10/09/13 Allene Dillon RN BSN Pt will be admitted to CIR today.

## 2013-09-27 NOTE — Progress Notes (Signed)
Inpatient Diabetes Program Recommendations  AACE/ADA: New Consensus Statement on Inpatient Glycemic Control (2013)  Target Ranges:  Prepandial:   less than 140 mg/dL      Peak postprandial:   less than 180 mg/dL (1-2 hours)      Critically ill patients:  140 - 180 mg/dL   Reason for Visit: Uncontrolled DM  63 year old male admitted with possible osteomyelitis of foot.   Diabetes history: Type 2 DM Outpatient Diabetes medications: None Current orders for Inpatient glycemic control: Lantus 20 units QHS, Novolog moderate tidwc and hs   Inpatient Diabetes Program Recommendations Insulin - Basal: Increase Lantus to 25 units QHS Insulin - Meal Coverage: Add Novolog 4 units tidwc for meal coverage insulin. Titrate until CBGs 180mg /dL HgbA1C: 13.7% - uncontrolled Outpatient Referral: OP Diabetes Education for uncontrolled DM - will order Please change Novolog to Q4 while NPO, then tidwc and hs  Note: Will begin diabetes education with videos, Living Well Book and referral for OP diabetes education.  Needs PCP to manage DM. Will probably need to change insulin at discharge to more affordable option such as Humulin NPH and Regular or 70/30.  Will follow. Thank you. Lorenda Peck, RD, LDN, CDE Inpatient Diabetes Coordinator [phone]

## 2013-09-27 NOTE — Progress Notes (Signed)
ANTIBIOTIC CONSULT NOTE - INITIAL  Pharmacy Consult for Cefepime, Vancomycin Indication: Cellulitis, r/o osteomyelitis, r/o necrotizing fasciitis  No Known Allergies  Patient Measurements: Height: 6\' 1"  (185.4 cm) Weight: 206 lb (93.441 kg) IBW/kg (Calculated) : 79.9  Vital Signs: Temp: 99.5 F (37.5 C) (01/29 0559) Temp src: Oral (01/29 0559) BP: 114/65 mmHg (01/29 0559) Pulse Rate: 81 (01/29 0559) Intake/Output from previous day:   Intake/Output from this shift:    Labs:  Recent Labs  09/26/13 2315 09/27/13 0508  WBC 34.5* 37.3*  HGB 11.3* 11.1*  PLT 463* 471*  CREATININE 0.81 0.94   Estimated Creatinine Clearance: 93.3 ml/min (by C-G formula based on Cr of 0.94). No results found for this basename: VANCOTROUGH, VANCOPEAK, VANCORANDOM, GENTTROUGH, GENTPEAK, GENTRANDOM, TOBRATROUGH, TOBRAPEAK, TOBRARND, AMIKACINPEAK, AMIKACINTROU, AMIKACIN,  in the last 72 hours   Microbiology: No results found for this or any previous visit (from the past 720 hour(s)).  Medical History: Past Medical History  Diagnosis Date  . Diabetes mellitus without complication   . Diverticulitis    Medications:  Scheduled:  . ceFEPime (MAXIPIME) IV  2 g Intravenous Q8H  . enoxaparin (LOVENOX) injection  40 mg Subcutaneous Q24H  .  HYDROmorphone (DILAUDID) injection  1 mg Intravenous Once  . insulin aspart  0-15 Units Subcutaneous TID WC  . insulin aspart  0-5 Units Subcutaneous QHS  . insulin glargine  20 Units Subcutaneous QHS  . vancomycin  1,250 mg Intravenous Q12H   Anti-infectives   Start     Dose/Rate Route Frequency Ordered Stop   09/27/13 1000  ceFEPIme (MAXIPIME) 2 g in dextrose 5 % 50 mL IVPB     2 g 100 mL/hr over 30 Minutes Intravenous Every 8 hours 09/27/13 0929     09/27/13 0015  vancomycin (VANCOCIN) 1,250 mg in sodium chloride 0.9 % 250 mL IVPB     1,250 mg 166.7 mL/hr over 90 Minutes Intravenous Every 12 hours 09/27/13 0001     09/26/13 2359   piperacillin-tazobactam (ZOSYN) IVPB 3.375 g  Status:  Discontinued     3.375 g 12.5 mL/hr over 240 Minutes Intravenous Every 8 hours 09/26/13 2252 09/27/13 0854     Assessment: 63 yo with poorly controlled DM2, presented from urgent care with 2 week history of left foot pain, swelling, erythema. Pt with fevers, chills and malaise. Zosyn per MD-now changed to Cefepime per Rx with Vancomycin per Rx for cellulitis, /r/o osteo and sepsis, r/o necrotizing fasciitis.  Foot Xray: osteo not definitive, but cannot rule out. Needs surgical consult for foreign body noted.  Poor DM control, poor circulation. Dosing Cefepime at higher dose q8hr.  Goal of Therapy:  Vancomycin trough level 15-20 mcg/ml abx dosing/schedule appropriate for renal function/ organisms  Plan:   Continue Vancomycin 1250mg  q12  Begin Cefepime 2gm q8h  Monitor cultures, clinical course  Minda Ditto PharmD Pager [phone] 09/27/2013, 9:42 AM

## 2013-09-27 NOTE — Transfer of Care (Signed)
Immediate Anesthesia Transfer of Care Note  Patient: Mark [name]  Procedure(s) Performed: Procedure(s): IRRIGATION AND DEBRIDEMENT EXTREMITY (Left)  Patient Location: PACU  Anesthesia Type:General  Level of Consciousness: awake, alert  and oriented  Airway & Oxygen Therapy: Patient Spontanous Breathing and Patient connected to face mask oxygen  Post-op Assessment: Report given to PACU RN and Post -op Vital signs reviewed and stable  Post vital signs: Reviewed and stable  Complications: No apparent anesthesia complications

## 2013-09-27 NOTE — Progress Notes (Signed)
ANTIBIOTIC CONSULT NOTE - INITIAL  Pharmacy Consult for Vancomycin Indication: Cellultis/R/o osteo/Sepsis  No Known Allergies  Patient Measurements: Height: Mark\' 1"  (185.4 cm) Weight: 206 lb (93.441 kg) IBW/kg (Calculated) : 79.9   Vital Signs: Temp: 99.3 F (37.4 C) (01/28 2127) Temp src: Oral (01/28 2127) BP: 124/69 mmHg (01/28 2127) Pulse Rate: 99 (01/28 2127) Intake/Output from previous day:   Intake/Output from this shift:    Labs:  Recent Labs  09/26/13 1931 09/26/13 2315  WBC 26.8* 34.5*  HGB 10.4* 11.3*  PLT  --  463*  CREATININE  --  0.81   Estimated Creatinine Clearance: 108.2 ml/min (by C-G formula based on Cr of 0.81). No results found for this basename: VANCOTROUGH, VANCOPEAK, VANCORANDOM, GENTTROUGH, GENTPEAK, GENTRANDOM, TOBRATROUGH, TOBRAPEAK, TOBRARND, AMIKACINPEAK, AMIKACINTROU, AMIKACIN,  in the last 72 hours   Microbiology: No results found for this or any previous visit (from the past 720 hour(s)).  Medical History: Past Medical History  Diagnosis Date  . Diabetes mellitus without complication   . Diverticulitis     Medications:  Scheduled:  . enoxaparin (LOVENOX) injection  40 mg Subcutaneous Q24H  .  HYDROmorphone (DILAUDID) injection  1 mg Intravenous Once  . insulin aspart  0-15 Units Subcutaneous TID WC  . insulin aspart  0-5 Units Subcutaneous QHS  . insulin glargine  20 Units Subcutaneous QHS  . piperacillin-tazobactam (ZOSYN)  IV  3.375 g Intravenous Q8H  . vancomycin  1,250 mg Intravenous Q12H   Infusions:  . sodium chloride 125 mL/hr at 09/26/13 2332   Assessment: 63 [name] with poorly controlled DM2, presents from urgent care with 2 week history of left foot pain, swelling, erythema.  Pt with fevers, chills and malaise.  Zosyn per MD and Vancomycin per Rx for cellulitis/r/o osteo and sepsis.  Goal of Therapy:  Vancomycin trough level 15-20 mcg/ml  Plan:   Vancomycin 1250mg  IV q12h  F/u SCr/levels/cultures as  needed  Lawana Pai R 09/27/2013,12:44 AM

## 2013-09-27 NOTE — Anesthesia Postprocedure Evaluation (Signed)
  Anesthesia Post-op Note  Patient: Mark [name]  Procedure(s) Performed: Procedure(s) (LRB): IRRIGATION AND DEBRIDEMENT EXTREMITY (Left)  Patient Location: PACU  Anesthesia Type: General  Level of Consciousness: awake and alert   Airway and Oxygen Therapy: Patient Spontanous Breathing  Post-op Pain: mild  Post-op Assessment: Post-op Vital signs reviewed, Patient's Cardiovascular Status Stable, Respiratory Function Stable, Patent Airway and No signs of Nausea or vomiting  Last Vitals:  Filed Vitals:   09/27/13 2000  BP: 131/74  Pulse: 83  Temp: 37.1 C  Resp: 18    Post-op Vital Signs: stable   Complications: No apparent anesthesia complications

## 2013-09-27 NOTE — Consult Note (Signed)
I have seen and examined Mark [name] and agree with the above note.  The infection is quite extensive in his left foot and needs surgery this evening.  I have spoken to the patient about this as well.

## 2013-09-27 NOTE — Brief Op Note (Signed)
09/26/2013 - 09/27/2013  6:48 PM  PATIENT:  Mark [name]  63 y.o. male  PRE-OPERATIVE DIAGNOSIS:  infected left foot  POST-OPERATIVE DIAGNOSIS:  infected left foot  PROCEDURE:  Procedure(s): IRRIGATION AND DEBRIDEMENT EXTREMITY (Left)  SURGEON:  Surgeon(s) and Role:    * Mcarthur Rossetti, MD - Primary  PHYSICIAN ASSISTANT: Benita Stabile, PA-C  ANESTHESIA:   general  EBL:  Total I/O In: -  Out: 1200 [Urine:1200]  BLOOD ADMINISTERED:none  DRAINS: none   LOCAL MEDICATIONS USED:  NONE  SPECIMEN:  No Specimen  DISPOSITION OF SPECIMEN:  N/A  COUNTS:  YES  TOURNIQUET:   Total Tourniquet Time Documented: Thigh (Left) - 21 minutes Total: Thigh (Left) - 21 minutes   DICTATION: .Other Dictation: Dictation Number [id number]  PLAN OF CARE: Admit to inpatient   PATIENT DISPOSITION:  PACU - hemodynamically stable.   Delay start of Pharmacological VTE agent (>24hrs) due to surgical blood loss or risk of bleeding: no

## 2013-09-28 ENCOUNTER — Encounter (HOSPITAL_COMMUNITY): Payer: Self-pay | Admitting: Orthopaedic Surgery

## 2013-09-28 DIAGNOSIS — L02619 Cutaneous abscess of unspecified foot: Secondary | ICD-10-CM

## 2013-09-28 DIAGNOSIS — L03119 Cellulitis of unspecified part of limb: Secondary | ICD-10-CM

## 2013-09-28 DIAGNOSIS — L02612 Cutaneous abscess of left foot: Secondary | ICD-10-CM | POA: Diagnosis present

## 2013-09-28 LAB — CBC WITH DIFFERENTIAL/PLATELET
Basophils Absolute: 0 10*3/uL (ref 0.0–0.1)
Basophils Relative: 0 % (ref 0–1)
Eosinophils Absolute: 0 10*3/uL (ref 0.0–0.7)
Eosinophils Relative: 0 % (ref 0–5)
HCT: 30.7 % — ABNORMAL LOW (ref 39.0–52.0)
Hemoglobin: 10.2 g/dL — ABNORMAL LOW (ref 13.0–17.0)
Lymphocytes Relative: 5 % — ABNORMAL LOW (ref 12–46)
Lymphs Abs: 1.6 10*3/uL (ref 0.7–4.0)
MCH: 30.4 pg (ref 26.0–34.0)
MCHC: 33.2 g/dL (ref 30.0–36.0)
MCV: 91.4 fL (ref 78.0–100.0)
Monocytes Absolute: 2.9 10*3/uL — ABNORMAL HIGH (ref 0.1–1.0)
Monocytes Relative: 9 % (ref 3–12)
Neutro Abs: 28.2 10*3/uL — ABNORMAL HIGH (ref 1.7–7.7)
Neutrophils Relative %: 86 % — ABNORMAL HIGH (ref 43–77)
Platelets: 522 10*3/uL — ABNORMAL HIGH (ref 150–400)
RBC: 3.36 MIL/uL — ABNORMAL LOW (ref 4.22–5.81)
RDW: 13.3 % (ref 11.5–15.5)
WBC: 32.7 10*3/uL — ABNORMAL HIGH (ref 4.0–10.5)

## 2013-09-28 LAB — GLUCOSE, CAPILLARY
Glucose-Capillary: 153 mg/dL — ABNORMAL HIGH (ref 70–99)
Glucose-Capillary: 146 mg/dL — ABNORMAL HIGH (ref 70–99)
Glucose-Capillary: 192 mg/dL — ABNORMAL HIGH (ref 70–99)
Glucose-Capillary: 210 mg/dL — ABNORMAL HIGH (ref 70–99)

## 2013-09-28 LAB — BASIC METABOLIC PANEL
BUN: 13 mg/dL (ref 6–23)
CO2: 26 mEq/L (ref 19–32)
Calcium: 8.1 mg/dL — ABNORMAL LOW (ref 8.4–10.5)
Chloride: 100 mEq/L (ref 96–112)
Creatinine, Ser: 0.84 mg/dL (ref 0.50–1.35)
GFR calc Af Amer: >90 mL/min (ref >90)
GFR calc non Af Amer: >90 mL/min (ref >90)
Glucose, Bld: 167 mg/dL — ABNORMAL HIGH (ref 70–99)
Potassium: 3.1 mEq/L — ABNORMAL LOW (ref 3.7–5.3)
Sodium: 139 mEq/L (ref 137–147)

## 2013-09-28 MED ORDER — LIVING WELL WITH DIABETES BOOK
Freq: Once | Status: AC
  Administered 2013-09-28: 11:00:00
  Filled 2013-09-28: qty 1

## 2013-09-28 MED ORDER — POTASSIUM CHLORIDE CRYS ER 20 MEQ PO TBCR
40.0000 meq | EXTENDED_RELEASE_TABLET | Freq: Once | ORAL | Status: AC
  Administered 2013-09-28: 40 meq via ORAL
  Filled 2013-09-28: qty 2

## 2013-09-28 MED ORDER — BACLOFEN 10 MG PO TABS
10.0000 mg | ORAL_TABLET | Freq: Three times a day (TID) | ORAL | Status: DC | PRN
  Administered 2013-10-07: 10 mg via ORAL
  Filled 2013-09-28: qty 1

## 2013-09-28 MED ORDER — BD GETTING STARTED TAKE HOME KIT: 1/2ML X 30G SYRINGES
1.0000 | Freq: Once | Status: DC
  Filled 2013-09-28: qty 1

## 2013-09-28 MED ORDER — ACETAMINOPHEN 500 MG PO TABS: 500.0000 mg | ORAL_TABLET | ORAL | Status: DC

## 2013-09-28 MED ORDER — SODIUM CHLORIDE 0.9 % IV BOLUS (SEPSIS)
500.0000 mL | Freq: Once | INTRAVENOUS | Status: AC
  Administered 2013-09-28: 500 mL via INTRAVENOUS

## 2013-09-28 MED ORDER — "BD GETTING STARTED TAKE HOME KIT: 1ML X 30 G SYRINGES, "
1.0000 | Freq: Once | Status: AC
  Administered 2013-09-28: 1
  Filled 2013-09-28: qty 1

## 2013-09-28 MED ORDER — ACETAMINOPHEN 500 MG PO TABS: 500.0000 mg | ORAL_TABLET | Freq: Once | ORAL | Status: AC | PRN

## 2013-09-28 NOTE — Op Note (Signed)
NAMEHIDEO, Mark [name] NO.:  [id number]  MEDICAL RECORD NO.:  [id number]  LOCATION:  [place]                         FACILITY:  [facility]  PHYSICIAN:  Lind Guest. Ninfa Linden, M.D.DATE OF BIRTH:  22-Feb-1951  DATE OF PROCEDURE:  09/27/2013 DATE OF DISCHARGE:                              OPERATIVE REPORT   PREOPERATIVE DIAGNOSIS:  Overwhelming infection and abscess, left foot.  POSTOPERATIVE DIAGNOSIS:  Overwhelming infection and abscess, left foot.  PROCEDURES:  Extensive irrigation and debridement of left foot. Multiple bursal sites with debridement of infected skin, soft tissue, and muscle.  FINDINGS:  Gross purulence throughout the medial and plantar aspect of the foot and a retained small foreign metal object in the foot.  Gram stain and cultures pending.  SURGEON:  Mcarthur Rossetti, M.D.  ASSISTANT:  Erskine Emery, PA-C.  ANESTHESIA:  General.  ANTIBIOTICS:  2 g IV Ancef after cultures obtained.  TOURNIQUET TIME:  Less than 1 hour.  COMPLICATIONS:  None.  INDICATIONS:  Mark [name] is a poorly controlled 63 year old diabetic who has had a 2-week history of left foot pain and swelling.  He was admitted to the emergency room last night for cellulitis, but had a high white blood cell count of over 30,000.  An x-ray was finally obtained this morning which showed air extensively throughout the soft tissues of his foot consistent with necrotizing fasciitis.  He had not had any type of open wound, but we can see there was a small metal object between his second and third metatarsals that could be the source of his infection but it was uncertain.  We did recommend an urgent irrigation and debridement, but at least an MRI before this to ascertain if there was any other nidus of infection.  The MRI also showed fasciitis and cellulitis with abscess and air throughout the foot.  He understands the urgent need for surgery on his foot.  I believe, this is  certainly a life-threatening or even a limb-threatening condition.  I have talked to him and his family about this.  PROCEDURE DESCRIPTION:  After informed consent was obtained and appropriate left foot was marked, he was brought to the operating room and placed supine on the operating table where general anesthesia was then obtained.  A nonsterile tourniquet was placed around his upper left thigh.  His left knee, shin, foot and ankle were prepped and draped with DuraPrep and sterile drapes.  A time-out was called to identify the correct patient and correct left foot.  We then made an incision along the medial border between the dorsal and plantar surface of his foot and found extensive gross purulence tracking all underneath the foot in the whole plantar area and some dorsally.  We then made a separate incision between the second and third metatarsals on the plantar aspect of his foot.  I did find a small piece of metal in his foot and I dissected with incision down to the heel and found further gross purulence.  I then made 2 incisions on the dorsum of his foot and likewise found infection.  I then irrigated 9 L of normal saline solution using pulsatile lavage throughout all the incisions that we  made in the foot. I then used a rongeur to remove necrotic soft tissue and fascia as well as some muscle and plantar fascial fibers on the bottom of his foot.  We then packed all wounds with damp Kerlix sponge and then dry Kerlix and then ABDs on top of this, as well as a Coban dressing.  We did let the tourniquet down prior to this and hemostasis was easily obtained.  He was awakened, extubated, and taken to the recovery room in stable condition.  All final counts were correct.  There were no complications noted.  Postoperatively, he will continue high dose antibiotics IV wise and will likely need several return trips to the operating room to get this under control.  I will probably order  hydrotherapy tomorrow with knowing the possibility that he could still end up with a below-knee amputation.  We will follow his labs closely as well.     Lind Guest. Ninfa Linden, M.D.     CYB/MEDQ  D:  09/27/2013  T:  09/28/2013  Job:  [id number]

## 2013-09-28 NOTE — Progress Notes (Signed)
[facility] PROGRESS NOTE  Mark [name] [id number] DOB: 1950/10/26 DOA: 09/26/2013 PCP: No PCP Per Patient   Brief narrative 63 y.o. male with a past medical history of poorly controlled type 2 diabetes mellitus, medication nonadherence, presents as a direct admit from the urgent care Center. He complained of a two-week history of left foot pain, swelling, erythema which has significantly worsened in the last 2-3 days. He reports associated subjective fevers, chills, malaise, and feeling quite ill. He was found to be febrile at the urgent care center having a temperature 102.7 with lab work showed a white count of 26.8 and a glucose of 359. Patient admitted for sepsis due to cellulitis of left foot.  Assessment/Plan: Sepsis due to Cellulitis of foot with abscess and concern for necrotizing fascitis -Patient presented with marked leukocytosis and fever - Xray of foot showed foreign body between 2nd and third toe and large amount of gas tracking along the foot concerning for necrotizing fascitis. An MRI of  the foot was done which again showed cellulitis with multiple small abscesses and concern for necrotizing fascitis.  orthopedics consulted and patient taken to OR on 1/29 for I&D. As per ortho he had gross purulence throughout the left foot along plantar aspect and some dorsally. Recommend hydrotherapy and further surgery over the weekend to salvage the foot. Follow abscess culture.  -IV hydration and pain control   Uncontrolled DM A1C of 13.7 . reports not on any meds due to inability to afford. CM consulted. Will need insulin upon d/c. [facility] Added lantus and SSI. Will adjust dose. -Emphasized on medication compliance  Tobacco abuse counseled on cessation  Hypokalemia Replenished  Code Status: full  Family Communication:none Disposition Plan:pending   Consultants:  ortho  Procedures:  none  Antibiotics:  IV  vanco and zosyn / cefepime ( 1/28>>)  HPI/Subjective: Patient seen and examined this morning. Denies pain over left foot  Objective: Filed Vitals:   09/28/13 1246  BP: 143/80  Pulse: 72  Temp: 98.9 F (37.2 C)  Resp: 18    Intake/Output Summary (Last 24 hours) at 09/28/13 1301 Last data filed at 09/28/13 0700  Gross per 24 hour  Intake    700 ml  Output    750 ml  Net    -50 ml   Filed Weights   09/26/13 2359  Weight: 93.441 kg (206 lb)    Exam:   General:  Elderly male lying in bed sleepy   HEENT: no pallor, moist oral mucosa  Chest: clear b/l, no added sounds  Abd: soft, NT, ND, BS+  Ext: Warm, dressing over her left foot  CNS: AAOX3 Data Reviewed: Basic Metabolic Panel:  Recent Labs Lab 09/26/13 2315 09/27/13 0508 09/28/13 0454  NA 134* 134* 139  K 3.4* 3.3* 3.1*  CL 92* 93* 100  CO2 25 26 26   GLUCOSE 336* 310* 167*  BUN 14 15 13   CREATININE 0.81 0.94 0.84  CALCIUM 8.5 8.5 8.1*   Liver Function Tests:  Recent Labs Lab 09/27/13 0508  AST 22  ALT 26  ALKPHOS 182*  BILITOT 0.5  PROT 6.6  ALBUMIN 2.1*   No results found for this basename: LIPASE, AMYLASE,  in the last 168 hours No results found for this basename: AMMONIA,  in the last 168 hours CBC:  Recent Labs Lab 09/26/13 2315 09/27/13 0508 09/28/13 0454  WBC 34.5* 37.3* 32.7*  NEUTROABS  --   --  28.2*  HGB 11.3* 11.1* 10.2*  HCT 33.3* 33.0* 30.7*  MCV 91.2 91.4 91.4  PLT 463* 471* 522*   Cardiac Enzymes: No results found for this basename: CKTOTAL, CKMB, CKMBINDEX, TROPONINI,  in the last 168 hours BNP (last 3 results) No results found for this basename: PROBNP,  in the last 8760 hours CBG:  Recent Labs Lab 09/27/13 1729 09/27/13 1902 09/27/13 2233 09/28/13 0710 09/28/13 1111  GLUCAP 207* 150* 153* 146* 192*    Recent Results (from the past 240 hour(s))  CULTURE, BLOOD (ROUTINE X 2)     Status: None   Collection Time    09/26/13 11:15 PM      Result Value  Range Status   Specimen Description BLOOD RIGHT ANTECUBITAL   Final   Special Requests BOTTLES DRAWN AEROBIC AND ANAEROBIC 5CC   Final   Culture  Setup Time     Final   Value: 09/27/2013 03:35     Performed at [facility]   Culture     Final   Value:        BLOOD CULTURE RECEIVED NO GROWTH TO DATE CULTURE WILL BE HELD FOR 5 DAYS BEFORE ISSUING A FINAL NEGATIVE REPORT     Performed at [facility]   Report Status PENDING   Incomplete  CULTURE, BLOOD (ROUTINE X 2)     Status: None   Collection Time    09/26/13 11:20 PM      Result Value Range Status   Specimen Description BLOOD RIGHT HAND   Final   Special Requests BOTTLES DRAWN AEROBIC AND ANAEROBIC 5CC   Final   Culture  Setup Time     Final   Value: 09/27/2013 03:34     Performed at [facility]   Culture     Final   Value:        BLOOD CULTURE RECEIVED NO GROWTH TO DATE CULTURE WILL BE HELD FOR 5 DAYS BEFORE ISSUING A FINAL NEGATIVE REPORT     Performed at [facility]   Report Status PENDING   Incomplete  SURGICAL PCR SCREEN     Status: Abnormal   Collection Time    09/27/13  4:17 PM      Result Value Range Status   MRSA, PCR NEGATIVE  NEGATIVE Final   Staphylococcus aureus POSITIVE (*) NEGATIVE Final   Comment:            The Xpert SA Assay (FDA     approved for NASAL specimens     in patients over 44 years of age),     is one component of     a comprehensive surveillance     program.  Test performance has     been validated by [employer] for patients greater     than or equal to 37 year old.     It is not intended     to diagnose infection nor to     guide or monitor treatment.  CULTURE, ROUTINE-ABSCESS     Status: None   Collection Time    09/27/13  6:05 PM      Result Value Range Status   Specimen Description ABSCESS LEFT FOOT   Final   Special Requests NONE   Final   Gram Stain     Final   Value: NO WBC SEEN     NO ORGANISMS SEEN     Performed by [facility] Mellody Drown RN  AT 2031 ON 09/27/13 BY HAMER N     Performed at [facility]   Culture PENDING   Incomplete   Report Status PENDING   Incomplete  ANAEROBIC CULTURE     Status: None   Collection Time    09/27/13  6:05 PM      Result Value Range Status   Specimen Description ABSCESS LEFT FOOT   Final   Special Requests NONE   Final   Gram Stain     Final   Value: NO WBC SEEN     NO ORGANISMS SEEN     Performed by [facility] V RN 2031 ON 09/27/13 BY HAMER N     Performed at [facility]   Culture     Final   Value: NO ANAEROBES ISOLATED; CULTURE IN PROGRESS FOR 5 DAYS     Performed at [facility]   Report Status PENDING   Incomplete  GRAM STAIN     Status: None   Collection Time    09/27/13  6:05 PM      Result Value Range Status   Specimen Description ABSCESS   Final   Special Requests LEFT FOOT   Final   Gram Stain     Final   Value: NO WBC SEEN     NO ORGANISMS SEEN     Gram Stain Report Called to,Read Back By and Verified With: [facility] RN 2031 [id number] HAMER,N   Report Status 09/27/2013 FINAL   Final     Studies: Mr Foot Left W Wo Contrast  09/27/2013   CLINICAL DATA:  Cellulitis and gangrene of the left foot. Question necrotizing fasciitis  EXAM: MRI OF THE LEFT FOREFOOT WITHOUT AND WITH CONTRAST  TECHNIQUE: Multiplanar, multisequence MR imaging was performed both before and after administration of intravenous contrast.  CONTRAST:  19 mL MULTIHANCE GADOBENATE DIMEGLUMINE 529 MG/ML IV SOLN  COMPARISON:  Plain films left foot 09/27/2013 at 8:12 a.m.  FINDINGS: There is extensive artifact about the distal foot secondary to the radiopaque foreign body between the heads of the second and third metatarsals seen on plain films. Artifact limits evaluation of the second and third metatarsals and proximal phalanges of the second third toes. Given this limitation, no bone marrow signal abnormality to suggest osteomyelitis is identified.   There is extensive edema and enhancement about the foot consistent with cellulitis. Innumerable locules of gas are seen tracking in the soft tissues of the foot, worse medially, as seen on plain films. There is a large fluid collection in the plantar soft tissues with multiple lobules of gas. The collection measures 6.2 cm transverse by 2.3 cm craniocaudal by approximately 11 cm long. A second fluid collection in the deeper soft tissues appears centered in the abductor hallucis muscle belly and measures 6.2 cm long by up to 2.1 cm transverse by 1.4 cm craniocaudal. More distally there is a fluid collection between the first and second metatarsals measuring 1.8 cm transverse by 1.4 cm craniocaudal by approximately 2.1 cm long.  IMPRESSION: Intense cellulitis of the left foot with multiple abscesses identified. Innumerable nodules of gas within soft tissue are worrisome for necrotizing fasciitis.   Electronically Signed   By: Inge Rise M.D.   On: 09/27/2013 11:15   Dg Foot 2 Views Left  09/27/2013   CLINICAL DATA:  Osteomyelitis.  EXAM: LEFT FOOT - 2 VIEW  COMPARISON:  None.  FINDINGS: There is a radiopaque fragment which may represent a needle  fragment or nail which projects over the plantar aspect of the second third metatarsal heads. This measures 11 mm in length and almost 2 mm in width. There is no focal osteolysis to suggest osteomyelitis however there is florid soft tissue swelling and gas tracking through the soft tissues of the foot, greater on the medial side than lateral. The edema tracks into the distal leg, with infiltration of Kager's fat pad. Hallux valgus and first MTP joint osteoarthritis is mild. Midfoot osteoarthritis is present, most pronounced at the first and second tarsometatarsal junction.  IMPRESSION: 1. Radiopaque foreign body measuring 12 mm in length in the plantar aspect of the foot over the second third metatarsal heads. 2. Large amount of gas tracking along the foot with  diffuse soft tissue swelling. The findings are concerning for necrotizing fasciitis. Prompt surgical consultation is recommended. Critical Value/emergent results were called by telephone at the time of interpretation on 09/27/2013 at 9:14 AM to Dr. Clementeen Graham , who verbally acknowledged these results.   Electronically Signed   By: Dereck Ligas M.D.   On: 09/27/2013 09:16    Scheduled Meds: . ceFEPime (MAXIPIME) IV  2 g Intravenous Q8H  . insulin aspart  0-15 Units Subcutaneous TID WC  . insulin aspart  0-5 Units Subcutaneous QHS  . insulin glargine  20 Units Subcutaneous QHS  . vancomycin  1,250 mg Intravenous Q12H   Continuous Infusions: . sodium chloride 125 mL/hr at 09/28/13 1208      Time spent: [place], [place]  [facility] Pager [phone] If 7PM-7AM, please contact night-coverage at [email or web address], password [facility] 09/28/2013, 1:01 PM  LOS: 2 days

## 2013-09-28 NOTE — Progress Notes (Signed)
Patient ID: Mark [name], male   DOB: Jan 07, 1951, 63 y.o.   MRN: [id number] Hydrotherapy performed this afternoon by the PT wound service.  Patient reports that his foot feels better.  He will hopefully be able to have more hydrotherapy over the weekend, at least once tomorrow prior to having more surgery.  Again, this is an attempt to hopefully salvage the foot.  If hydrotherapy can be performed at the bedside again tomorrow 09/29/13, then I can hold off on another foot surgery until a later time.

## 2013-09-28 NOTE — Progress Notes (Signed)
09/28/13 1300  Subjective Assessment  Subjective Do i need to be put out?  Patient and Family Stated Goals save my foot  Date of Onset 09/18/13  Prior Treatments I and D  Evaluation and Treatment  Evaluation and Treatment Procedures Explained to Patient/Family Yes  Evaluation and Treatment Procedures agreed to  Wound 09/28/13 Other (Comment) Foot Left s/p I and D Left foot 09/27/13  Date First Assessed/Time First Assessed: 09/28/13 1254   Wound Type: Other (Comment)  Location: Foot  Location Orientation: Left  Wound Description (Comments): s/p I and D Left foot 09/27/13  Present on Admission: (c)   Site / Wound Assessment (see note for details )  WOUND MEASUREMENTS  #1  Anterolateral-dorsum L foot 9.0 x1.4 x 0.7         100% slough  #2  Anteromedial-dorsum L foot 4.5 x 1.2 x 1.5         100% slough  #3 Medial foot incision 11.0 x 1.5 x 3.0         40% slough/40% granulation/20% tendon  #4 Plantar surface incision 14.0 x 0.7 x 4.0          50% slough  50% granulation                Wound Therapy - Assess/Plan/Recommendations  Wound Therapy - Clinical Statement pt will benefit from hydrotherapy to facilitate wound healing through cleansing, debridement, dressing changes and education  Wound Therapy - Functional Problem List decr mobility  Factors Delaying/Impairing Wound Healing Altered sensation;Multiple medical problems;Infection - systemic/local;Diabetes Mellitus  Hydrotherapy Plan Debridement;Dressing change;Patient/family education;Pulsatile lavage with suction  Wound Therapy - Frequency 6X / week  Wound Therapy - Follow Up Recommendations Home health RN;Wound Hardy will follow 6x/ wk for hydrotherapy  Wound Therapy Goals - Improve the function of patient's integumentary system by progressing the wound(s) through the phases of wound healing by:  Decrease Necrotic Tissue to 50 (all wounds)  Decrease Necrotic Tissue - Progress Goal set today  Increase  Granulation Tissue to 50 (all wounds)  Increase Granulation Tissue - Progress Goal set today  Decrease Length/Width/Depth by (cm) 0/0/.2  Decrease Length/Width/Depth - Progress Goal set today  Goals/treatment plan/discharge plan were made with and agreed upon by patient/family Yes  Time For Goal Achievement 2 weeks  Wound Therapy - Potential for Goals Fair

## 2013-09-28 NOTE — Progress Notes (Signed)
Patient ID: CROCKETT RIDDER, male   DOB: 08-24-1951, 63 y.o.   MRN: [id number] Mr. Greenwalt's left foot had abundant gross purulence through out the plantar aspect and some dorsally.  His WBC is still high.  He will need hydrotherapy this afternoon by the PT wound service and further surgery this weekend to hopefully clear the infection and salvage his foot.  He still may end up needing a BKA.

## 2013-09-29 LAB — GLUCOSE, CAPILLARY
Glucose-Capillary: 266 mg/dL — ABNORMAL HIGH (ref 70–99)
Glucose-Capillary: 253 mg/dL — ABNORMAL HIGH (ref 70–99)
Glucose-Capillary: 233 mg/dL — ABNORMAL HIGH (ref 70–99)
Glucose-Capillary: 242 mg/dL — ABNORMAL HIGH (ref 70–99)
Glucose-Capillary: 207 mg/dL — ABNORMAL HIGH (ref 70–99)

## 2013-09-29 LAB — CBC WITH DIFFERENTIAL/PLATELET
Basophils Absolute: 0 10*3/uL (ref 0.0–0.1)
Basophils Relative: 0 % (ref 0–1)
Eosinophils Absolute: 0.2 10*3/uL (ref 0.0–0.7)
Eosinophils Relative: 1 % (ref 0–5)
HCT: 31.1 % — ABNORMAL LOW (ref 39.0–52.0)
Hemoglobin: 10.2 g/dL — ABNORMAL LOW (ref 13.0–17.0)
Lymphocytes Relative: 12 % (ref 12–46)
Lymphs Abs: 2.5 10*3/uL (ref 0.7–4.0)
MCH: 30.4 pg (ref 26.0–34.0)
MCHC: 32.8 g/dL (ref 30.0–36.0)
MCV: 92.6 fL (ref 78.0–100.0)
Monocytes Absolute: 2.1 10*3/uL — ABNORMAL HIGH (ref 0.1–1.0)
Monocytes Relative: 10 % (ref 3–12)
Neutro Abs: 16.3 10*3/uL — ABNORMAL HIGH (ref 1.7–7.7)
Neutrophils Relative %: 77 % (ref 43–77)
Platelets: 545 10*3/uL — ABNORMAL HIGH (ref 150–400)
RBC: 3.36 MIL/uL — ABNORMAL LOW (ref 4.22–5.81)
RDW: 13.4 % (ref 11.5–15.5)
WBC Morphology: INCREASED
WBC: 21.1 10*3/uL — ABNORMAL HIGH (ref 4.0–10.5)

## 2013-09-29 LAB — BASIC METABOLIC PANEL
BUN: 15 mg/dL (ref 6–23)
CO2: 26 mEq/L (ref 19–32)
Calcium: 8 mg/dL — ABNORMAL LOW (ref 8.4–10.5)
Chloride: 99 mEq/L (ref 96–112)
Creatinine, Ser: 0.88 mg/dL (ref 0.50–1.35)
GFR calc Af Amer: >90 mL/min (ref >90)
GFR calc non Af Amer: >90 mL/min (ref >90)
Glucose, Bld: 257 mg/dL — ABNORMAL HIGH (ref 70–99)
Potassium: 3.6 mEq/L — ABNORMAL LOW (ref 3.7–5.3)
Sodium: 134 mEq/L — ABNORMAL LOW (ref 137–147)

## 2013-09-29 LAB — VANCOMYCIN, TROUGH: Vancomycin Tr: 12.7 ug/mL (ref 10.0–20.0)

## 2013-09-29 MED ORDER — INSULIN GLARGINE 100 UNIT/ML ~~LOC~~ SOLN
25.0000 [IU] | Freq: Every day | SUBCUTANEOUS | Status: DC
  Administered 2013-09-29: 25 [IU] via SUBCUTANEOUS
  Filled 2013-09-29 (×2): qty 0.25

## 2013-09-29 MED ORDER — INSULIN ASPART 100 UNIT/ML ~~LOC~~ SOLN
4.0000 [IU] | Freq: Three times a day (TID) | SUBCUTANEOUS | Status: DC
  Administered 2013-09-29 – 2013-09-30 (×4): 4 [IU] via SUBCUTANEOUS

## 2013-09-29 MED ORDER — VANCOMYCIN HCL 10 G IV SOLR
1500.0000 mg | Freq: Two times a day (BID) | INTRAVENOUS | Status: DC
  Administered 2013-09-29 – 2013-10-07 (×16): 1500 mg via INTRAVENOUS
  Filled 2013-09-29 (×19): qty 1500

## 2013-09-29 MED ORDER — POTASSIUM CHLORIDE CRYS ER 20 MEQ PO TBCR
40.0000 meq | EXTENDED_RELEASE_TABLET | Freq: Once | ORAL | Status: AC
  Administered 2013-09-29: 40 meq via ORAL
  Filled 2013-09-29: qty 2

## 2013-09-29 NOTE — Progress Notes (Signed)
ANTIBIOTIC CONSULT NOTE -follow up  Pharmacy Consult for Cefepime, Vancomycin Indication: Cellulitis, r/o osteomyelitis, r/o necrotizing fasciitis  No Known Allergies  Patient Measurements: Height: 6\' 1"  ([id number] cm) Weight: 206 lb (93.441 kg) IBW/kg (Calculated) : 79.9  Vital Signs: Temp: 99.7 F (37.6 C) (01/31 0559) Temp src: Oral (01/31 0559) BP: 135/76 mmHg (01/31 0559) Pulse Rate: 72 (01/31 0559) Intake/Output from previous day: 01/30 0701 - 01/31 0700 In: 4427.2 [P.O.:240; I.V.:4137.2; IV Piggyback:50] Out: 300 [Urine:300] Intake/Output from this shift:    Labs:  Recent Labs  09/27/13 0508 09/28/13 0454 09/29/13 0540  WBC 37.3* 32.7* 21.1*  HGB 11.1* 10.2* 10.2*  PLT 471* 522* 545*  CREATININE 0.94 0.84 0.88   Estimated Creatinine Clearance: 99.6 ml/min (by C-G formula based on Cr of 0.88).  Recent Labs  09/29/13 1109  Ross 12.7    Microbiology: Recent Results (from the past 720 hour(s))  CULTURE, BLOOD (ROUTINE X 2)     Status: None   Collection Time    09/26/13 11:15 PM      Result Value Range Status   Specimen Description BLOOD RIGHT ANTECUBITAL   Final   Special Requests BOTTLES DRAWN AEROBIC AND ANAEROBIC 5CC   Final   Culture  Setup Time     Final   Value: 09/27/2013 03:35     Performed at [facility]   Culture     Final   Value:        BLOOD CULTURE RECEIVED NO GROWTH TO DATE CULTURE WILL BE HELD FOR 5 DAYS BEFORE ISSUING A FINAL NEGATIVE REPORT     Performed at [facility]   Report Status PENDING   Incomplete  CULTURE, BLOOD (ROUTINE X 2)     Status: None   Collection Time    09/26/13 11:20 PM      Result Value Range Status   Specimen Description BLOOD RIGHT HAND   Final   Special Requests BOTTLES DRAWN AEROBIC AND ANAEROBIC 5CC   Final   Culture  Setup Time     Final   Value: 09/27/2013 03:34     Performed at [facility]   Culture     Final   Value:        BLOOD CULTURE RECEIVED NO GROWTH TO DATE  CULTURE WILL BE HELD FOR 5 DAYS BEFORE ISSUING A FINAL NEGATIVE REPORT     Performed at [facility]   Report Status PENDING   Incomplete  SURGICAL PCR SCREEN     Status: Abnormal   Collection Time    09/27/13  4:17 PM      Result Value Range Status   MRSA, PCR NEGATIVE  NEGATIVE Final   Staphylococcus aureus POSITIVE (*) NEGATIVE Final   Comment:            The Xpert SA Assay (FDA     approved for NASAL specimens     in patients over 98 years of age),     is one component of     a comprehensive surveillance     program.  Test performance has     been validated by [employer] for patients greater     than or equal to 32 year old.     It is not intended     to diagnose infection nor to     guide or monitor treatment.  CULTURE, ROUTINE-ABSCESS     Status: None   Collection Time  09/27/13  6:05 PM      Result Value Range Status   Specimen Description ABSCESS LEFT FOOT   Final   Special Requests NONE   Final   Gram Stain     Final   Value: NO WBC SEEN     NO ORGANISMS SEEN     Performed by [facility] V RN AT 2031 ON 09/27/13 BY HAMER N     Performed at [facility]   Culture     Final   Value: MODERATE STAPHYLOCOCCUS SPECIES (COAGULASE NEGATIVE)     Performed at [facility]   Report Status PENDING   Incomplete  ANAEROBIC CULTURE     Status: None   Collection Time    09/27/13  6:05 PM      Result Value Range Status   Specimen Description ABSCESS LEFT FOOT   Final   Special Requests NONE   Final   Gram Stain     Final   Value: NO WBC SEEN     NO ORGANISMS SEEN     Performed by [facility] V RN 2031 ON 09/27/13 BY HAMER N     Performed at [facility]   Culture     Final   Value: NO ANAEROBES ISOLATED; CULTURE IN PROGRESS FOR 5 DAYS     Performed at [facility]   Report Status PENDING   Incomplete  GRAM STAIN     Status: None   Collection Time    09/27/13  6:05 PM      Result  Value Range Status   Specimen Description ABSCESS   Final   Special Requests LEFT FOOT   Final   Gram Stain     Final   Value: NO WBC SEEN     NO ORGANISMS SEEN     Gram Stain Report Called to,Read Back By and Verified With: [facility] RN 2031 [id number] HAMER,N   Report Status 09/27/2013 FINAL   Final   Anti-infectives   Start     Dose/Rate Route Frequency Ordered Stop   09/27/13 1000  ceFEPIme (MAXIPIME) 2 g in dextrose 5 % 50 mL IVPB     2 g 100 mL/hr over 30 Minutes Intravenous Every 8 hours 09/27/13 0929     09/27/13 0015  vancomycin (VANCOCIN) 1,250 mg in sodium chloride 0.9 % 250 mL IVPB     1,250 mg 166.7 mL/hr over 90 Minutes Intravenous Every 12 hours 09/27/13 0001     09/26/13 2359  piperacillin-tazobactam (ZOSYN) IVPB 3.375 g  Status:  Discontinued     3.375 g 12.5 mL/hr over 240 Minutes Intravenous Every 8 hours 09/26/13 2252 09/27/13 0854     Assessment: 63 yo with poorly controlled DM2, presented from urgent care with 2 week history of left foot pain, swelling, erythema. Pt with fevers, chills and malaise. Zosyn per MD-now changed to Cefepime per Rx with Vancomycin per Rx for cellulitis, r/o osteo and sepsis, r/o necrotizing fasciitis.  Foot Xray: osteo not definitive, but cannot rule out.   1/30 I&D of foot wound, foreign body removed. Plan excision of 3rd toe likely this week per ortho.  Poor DM control, poor circulation. Dosing Cefepime at higher dose q8hr.  Vancomycin trough today 12.7 on 1250mg  q12  Goal of Therapy:  Vancomycin trough level 15-20 mcg/ml abx dosing/schedule appropriate for renal function/ organisms  Plan:   Increase Vancomycin to 1500mg  q12  Continue Cefepime 2gm q8h  Monitor  cultures, clinical course  Minda Ditto PharmD Pager [phone] 09/29/2013, 1:39 PM

## 2013-09-29 NOTE — Progress Notes (Addendum)
PT HYDROTHERAPY  TREATMENT   NOTE                  09/29/13 1300  Subjective Assessment  Subjective "You are a terror"  Patient and Family Stated Goals save my foot  Date of Onset 09/18/13  Prior Treatments I and D 1.29/15  Evaluation and Treatment  Evaluation and Treatment Procedures Explained to Patient/Family Yes   Spoke to Dr.Blackman regarding pt/wound status and goals; Will see pt on Sunday as this is an attempt to salvage the limb; Pt with necrotic 3rd toe at this time (Dr. Ninfa Linden is aware) and plans are for more surgery likely next week; Pt continues to have copious amounts of drainage, erythema and edema however his white count is down today; Discussed need for adequate nutrition during session today and verbalizes understanding but would like to have more control of his food choices; Will discuss with RN;  Overall wounds look slightly cleaner today.  Evaluation and Treatment Procedures agreed to  Wound 09/28/13 Other (Comment) Foot Left s/p I and D Left foot 09/27/13  Date First Assessed/Time First Assessed: 09/28/13 1254   Wound Type: Other (Comment)  Location: Foot  Location Orientation: Left  Wound Description (Comments): s/p I and D Left foot 09/27/13  Present on Admission: (c)   Site / Wound Assessment Granulation tissue;Black;Purple;Red;Yellow  % Wound base Red or Granulating (see note)    WOUND MEASUREMENTS (per eval on 09/28/13)  #1 Anterolateral-dorsum L foot 9.0 x1.4 x 0.7  100% slough  #2 Anteromedial-dorsum L foot 4.5 x 1.2 x 1.5  100% slough  #3 Medial foot incision 11.0 x 1.5 x 3.0  40% slough/40% granulation/20% tendon  #4 Plantar surface incision 14.0 x 0.7 x 4.0  50% slough 50% granulation             Closure None  Drainage Amount Copious  Drainage Description No odor;Serosanguineous (mild odor old dressing)  Treatment Cleansed;Debridement (Selective);Hydrotherapy (Pulse lavage);Packing (Saline gauze)  Dressing Type Moist to dry;Gauze (Comment)   Dressing Changed Changed  Hydrotherapy  Pulsed Lavage with Suction (psi) 4 psi  Pulsed Lavage with Suction - Normal Saline Used 1000 mL  Pulsed Lavage Tip Tip with splash shield  Pulsed lavage therapy - wound location Left foot  Selective Debridement  Selective Debridement - Location Left foot wounds  Selective Debridement - Tools Used Forceps;Scissors  Selective Debridement - Tissue Removed necrotic tissue, yellow slough inside wounds, macerated, necrosing skin over outside of foot  Wound Therapy - Assess/Plan/Recommendations  Wound Therapy - Clinical Statement pt will benefit from hydrotherapy to facilitate wound healing through cleansing, debridement, dressing changes and education  Wound Therapy - Functional Problem List decr mobility  Factors Delaying/Impairing Wound Healing Altered sensation;Multiple medical problems;Infection - systemic/local;Diabetes Mellitus  Hydrotherapy Plan Debridement;Dressing change;Patient/family education;Pulsatile lavage with suction  Wound Therapy - Frequency 6X / week (will also see Sunday)  Wound Therapy - Current Recommendations PT  Wound Therapy - Follow Up Recommendations Home health RN;Wound Deltana will follow 6x/ wk for hydrotherapy  Wound Therapy Goals - Improve the function of patient's integumentary system by progressing the wound(s) through the phases of wound healing by:  Decrease Necrotic Tissue to 50  Decrease Necrotic Tissue - Progress Progressing toward goal  Increase Granulation Tissue to 50  Increase Granulation Tissue - Progress Progressing toward goal  Decrease Length/Width/Depth by (cm) 0/0/.2  Decrease Length/Width/Depth - Progress Progressing toward goal  Time For Goal Achievement 2 weeks  Wound Therapy -  Potential for Goals Fair

## 2013-09-29 NOTE — Progress Notes (Signed)
[facility] PROGRESS NOTE  Mark [name] [id number] DOB: 1951-08-18 DOA: 09/26/2013 PCP: No PCP Per Patient   Brief narrative 63 y.o. male with a past medical history of poorly controlled type 2 diabetes mellitus, medication nonadherence, presents as a direct admit from the urgent care Center. He complained of a two-week history of left foot pain, swelling, erythema which has significantly worsened in the last 2-3 days. He reports associated subjective fevers, chills, malaise, and feeling quite ill. He was found to be febrile at the urgent care center having a temperature 102.7 with lab work showed a white count of 26.8 and a glucose of 359. Patient admitted for sepsis due to cellulitis of left foot.  Assessment/Plan: Sepsis due to Cellulitis of foot with abscess and concern for necrotizing fascitis -Patient presented with marked leukocytosis and fever - Xray of foot showed foreign body between 2nd and third toe and large amount of gas tracking along the foot concerning for necrotizing fascitis. An MRI of  the foot was done which again showed cellulitis with multiple small abscesses and concern for necrotizing fascitis.  orthopedics consulted and patient taken to OR on 1/29 for I&D. As per ortho he had gross purulence throughout the left foot along plantar aspect and some dorsally. Recommend hydrotherapy which was started on 1/30. Follow abscess culture. Further I&D per ortho  -IV hydration and pain control   Uncontrolled DM A1C of 13.7 . reports not on any meds due to inability to afford. CM consulted. Will need insulin upon d/c.  FSG still elevated will increase lantus and meal coverage. [facility]. Will discharge on BID novolog.  -Emphasized on medication compliance  Tobacco abuse counseled on cessation  Hypokalemia Replenished  Code Status: full  Family Communication:none Disposition  Plan:pending   Consultants:  ortho  Procedures:  none  Antibiotics:  IV vanco and zosyn / cefepime ( 1/28>>)  HPI/Subjective: Patient seen and examined this morning. Informs feeling better .  Objective: Filed Vitals:   09/29/13 0559  BP: 135/76  Pulse: 72  Temp: 99.7 F (37.6 C)  Resp: 18    Intake/Output Summary (Last 24 hours) at 09/29/13 1046 Last data filed at 09/29/13 0655  Gross per 24 hour  Intake 4427.23 ml  Output    300 ml  Net 4127.23 ml   Filed Weights   09/26/13 2359  Weight: 93.441 kg (206 lb)    Exam:   General:  Elderly male lying in bed in no acute distress   HEENT: no pallor, moist oral mucosa  Chest: clear b/l, no added sounds  Abd: soft, NT, ND, BS+  Ext: Warm, dressing over her left foot  CNS: AAOX3 Data Reviewed: Basic Metabolic Panel:  Recent Labs Lab 09/26/13 2315 09/27/13 0508 09/28/13 0454 09/29/13 0540  NA 134* 134* 139 134*  K 3.4* 3.3* 3.1* 3.6*  CL 92* 93* 100 99  CO2 25 26 26 26   GLUCOSE 336* 310* 167* 257*  BUN 14 15 13 15   CREATININE 0.81 0.94 0.84 0.88  CALCIUM 8.5 8.5 8.1* 8.0*   Liver Function Tests:  Recent Labs Lab 09/27/13 0508  AST 22  ALT 26  ALKPHOS 182*  BILITOT 0.5  PROT 6.6  ALBUMIN 2.1*   No results found for this basename: LIPASE, AMYLASE,  in the last 168 hours No results found for this basename: AMMONIA,  in the last 168 hours CBC:  Recent Labs Lab 09/26/13 2315 09/27/13 0508 09/28/13 0454 09/29/13  0540  WBC 34.5* 37.3* 32.7* 21.1*  NEUTROABS  --   --  28.2* 16.3*  HGB 11.3* 11.1* 10.2* 10.2*  HCT 33.3* 33.0* 30.7* 31.1*  MCV 91.2 91.4 91.4 92.6  PLT 463* 471* 522* 545*   Cardiac Enzymes: No results found for this basename: CKTOTAL, CKMB, CKMBINDEX, TROPONINI,  in the last 168 hours BNP (last 3 results) No results found for this basename: PROBNP,  in the last 8760 hours CBG:  Recent Labs Lab 09/28/13 1111 09/28/13 1630 09/28/13 2133 09/29/13 0245  09/29/13 0738  GLUCAP 192* 210* 266* 253* 233*    Recent Results (from the past 240 hour(s))  CULTURE, BLOOD (ROUTINE X 2)     Status: None   Collection Time    09/26/13 11:15 PM      Result Value Range Status   Specimen Description BLOOD RIGHT ANTECUBITAL   Final   Special Requests BOTTLES DRAWN AEROBIC AND ANAEROBIC 5CC   Final   Culture  Setup Time     Final   Value: 09/27/2013 03:35     Performed at [facility]   Culture     Final   Value:        BLOOD CULTURE RECEIVED NO GROWTH TO DATE CULTURE WILL BE HELD FOR 5 DAYS BEFORE ISSUING A FINAL NEGATIVE REPORT     Performed at [facility]   Report Status PENDING   Incomplete  CULTURE, BLOOD (ROUTINE X 2)     Status: None   Collection Time    09/26/13 11:20 PM      Result Value Range Status   Specimen Description BLOOD RIGHT HAND   Final   Special Requests BOTTLES DRAWN AEROBIC AND ANAEROBIC 5CC   Final   Culture  Setup Time     Final   Value: 09/27/2013 03:34     Performed at [facility]   Culture     Final   Value:        BLOOD CULTURE RECEIVED NO GROWTH TO DATE CULTURE WILL BE HELD FOR 5 DAYS BEFORE ISSUING A FINAL NEGATIVE REPORT     Performed at [facility]   Report Status PENDING   Incomplete  SURGICAL PCR SCREEN     Status: Abnormal   Collection Time    09/27/13  4:17 PM      Result Value Range Status   MRSA, PCR NEGATIVE  NEGATIVE Final   Staphylococcus aureus POSITIVE (*) NEGATIVE Final   Comment:            The Xpert SA Assay (FDA     approved for NASAL specimens     in patients over 76 years of age),     is one component of     a comprehensive surveillance     program.  Test performance has     been validated by [employer] for patients greater     than or equal to 51 year old.     It is not intended     to diagnose infection nor to     guide or monitor treatment.  CULTURE, ROUTINE-ABSCESS     Status: None   Collection Time    09/27/13  6:05 PM      Result Value  Range Status   Specimen Description ABSCESS LEFT FOOT   Final   Special Requests NONE   Final   Gram Stain     Final   Value: NO  WBC SEEN     NO ORGANISMS SEEN     Performed by [facility] [place] V RN AT 2031 ON 09/27/13 BY [facility] N     Performed at [facility]   Culture     Final   Value: MODERATE STAPHYLOCOCCUS SPECIES (COAGULASE NEGATIVE)     Performed at [facility]   Report Status PENDING   Incomplete  ANAEROBIC CULTURE     Status: None   Collection Time    09/27/13  6:05 PM      Result Value Range Status   Specimen Description ABSCESS LEFT FOOT   Final   Special Requests NONE   Final   Gram Stain     Final   Value: NO WBC SEEN     NO ORGANISMS SEEN     Performed by [facility] V RN 2031 ON 09/27/13 BY HAMER N     Performed at [facility]   Culture     Final   Value: NO ANAEROBES ISOLATED; CULTURE IN PROGRESS FOR 5 DAYS     Performed at [facility]   Report Status PENDING   Incomplete  GRAM STAIN     Status: None   Collection Time    09/27/13  6:05 PM      Result Value Range Status   Specimen Description ABSCESS   Final   Special Requests LEFT FOOT   Final   Gram Stain     Final   Value: NO WBC SEEN     NO ORGANISMS SEEN     Gram Stain Report Called to,Read Back By and Verified With: [facility] RN 2031 [id number] HAMER,N   Report Status 09/27/2013 FINAL   Final     Studies: No results found.  Scheduled Meds: . ceFEPime (MAXIPIME) IV  2 g Intravenous Q8H  . insulin aspart  0-15 Units Subcutaneous TID WC  . insulin aspart  0-5 Units Subcutaneous QHS  . insulin glargine  20 Units Subcutaneous QHS  . vancomycin  1,250 mg Intravenous Q12H   Continuous Infusions: . sodium chloride 125 mL/hr at 09/29/13 0655      Time spent: [place], Nickola Lenig  [facility] Pager [phone] If 7PM-7AM, please contact night-coverage at [email or web address], password [facility] 09/29/2013, 10:46 AM  LOS: 3  days

## 2013-09-29 NOTE — Progress Notes (Signed)
Patient ID: Mark [name], male   DOB: Apr 29, 1951, 63 y.o.   MRN: [id number] I spoke with PT wound therapy and they do plan to see Mark [name] today and tomorrow since this is a limb salvage situation.  His WBC is coming down.  He will still likely need further surgery to, at a minimum, remove his third toe.  I will liekly do this early next week.

## 2013-09-30 LAB — CULTURE, ROUTINE-ABSCESS: Gram Stain: NONE SEEN

## 2013-09-30 LAB — CBC WITH DIFFERENTIAL/PLATELET
Basophils Absolute: 0 10*3/uL (ref 0.0–0.1)
Basophils Relative: 0 % (ref 0–1)
Eosinophils Absolute: 0.3 10*3/uL (ref 0.0–0.7)
Eosinophils Relative: 2 % (ref 0–5)
HCT: 30.8 % — ABNORMAL LOW (ref 39.0–52.0)
Hemoglobin: 10.3 g/dL — ABNORMAL LOW (ref 13.0–17.0)
Lymphocytes Relative: 15 % (ref 12–46)
Lymphs Abs: 2.6 10*3/uL (ref 0.7–4.0)
MCH: 30.7 pg (ref 26.0–34.0)
MCHC: 33.4 g/dL (ref 30.0–36.0)
MCV: 91.7 fL (ref 78.0–100.0)
Monocytes Absolute: 2.2 10*3/uL — ABNORMAL HIGH (ref 0.1–1.0)
Monocytes Relative: 13 % — ABNORMAL HIGH (ref 3–12)
Neutro Abs: 12 10*3/uL — ABNORMAL HIGH (ref 1.7–7.7)
Neutrophils Relative %: 70 % (ref 43–77)
Platelets: 644 10*3/uL — ABNORMAL HIGH (ref 150–400)
RBC: 3.36 MIL/uL — ABNORMAL LOW (ref 4.22–5.81)
RDW: 13.4 % (ref 11.5–15.5)
WBC: 17.1 10*3/uL — ABNORMAL HIGH (ref 4.0–10.5)

## 2013-09-30 LAB — GLUCOSE, CAPILLARY
Glucose-Capillary: 170 mg/dL — ABNORMAL HIGH (ref 70–99)
Glucose-Capillary: 282 mg/dL — ABNORMAL HIGH (ref 70–99)
Glucose-Capillary: 238 mg/dL — ABNORMAL HIGH (ref 70–99)
Glucose-Capillary: 188 mg/dL — ABNORMAL HIGH (ref 70–99)
Glucose-Capillary: 157 mg/dL — ABNORMAL HIGH (ref 70–99)

## 2013-09-30 MED ORDER — LISINOPRIL 10 MG PO TABS
10.0000 mg | ORAL_TABLET | Freq: Every day | ORAL | Status: DC
  Administered 2013-09-30 – 2013-10-01 (×2): 10 mg via ORAL
  Filled 2013-09-30 (×2): qty 1

## 2013-09-30 MED ORDER — INSULIN GLARGINE 100 UNIT/ML ~~LOC~~ SOLN
32.0000 [IU] | Freq: Every day | SUBCUTANEOUS | Status: DC
  Administered 2013-09-30: 32 [IU] via SUBCUTANEOUS
  Filled 2013-09-30 (×2): qty 0.32

## 2013-09-30 MED ORDER — INSULIN ASPART 100 UNIT/ML ~~LOC~~ SOLN
7.0000 [IU] | Freq: Three times a day (TID) | SUBCUTANEOUS | Status: DC
  Administered 2013-09-30 – 2013-10-01 (×3): 7 [IU] via SUBCUTANEOUS

## 2013-09-30 NOTE — Progress Notes (Signed)
Pt was assisted to bathroom by RN using a front wheel walker. Pt asked for privacy and RN instructed pt to call when finished. RN left room to respond to a bed alarm of another pt. Pt's family at bedside. Two RNs heard loud noise from outside pt's room and rushed in to find pt on bathroom floor. Pt was found sitting on the bathroom floor in front of the toilet. Pt denied pain or injury as a result of the fall. Pt's vitals were taken, RN assessed patient for injury and no changes in pt condition were found, MD notified. Fall safety huddle was completed with pt and family at bedside. Pt was given fall safety plan and signed to confirm agreement. Pt states no questions or concerns in relation to fall at this time. Will continue to monitor pt.

## 2013-09-30 NOTE — Progress Notes (Addendum)
09/30/13 1200  Subjective Assessment  Subjective i need to get back in bed,how does my foot look?  Patient and Family Stated Goals save my foot  Date of Onset 09/18/13  Prior Treatments I and D 1.29/15  Evaluation and Treatment  Evaluation and Treatment Procedures Explained to Patient/Family Yes  Evaluation and Treatment Procedures agreed to  Wound 09/28/13 Other (Comment) Foot Left s/p I and D Left foot 09/27/13  Date First Assessed/Time First Assessed: 09/28/13 1254   Wound Type: Other (Comment)  Location: Foot  Location Orientation: Left  Wound Description (Comments): s/p I and D Left foot 09/27/13  Present on Admission: (c)   Site / Wound Assessment Granulation tissue;Black;Purple;Red;Yellow  % Wound base Red or Granulating (*see note*)   WOUND MEASUREMENTS (per eval on 09/28/13) Tissue percentages updated this date   #1 Anterolateral-dorsum L foot 9.0 x1.4 x 0.7  80% slough/15% granulation/5% tendon  #2 Anteromedial-dorsum L foot 4.5 x 1.2 x 1.5  90% slough/10%granulation  #3 Medial foot incision 11.0 x 1.5 x 3.0  40% slough/50% granulation/10% tendon   #4 Plantar surface incision 14.0 x 0.7 x 4.0  45% slough 55% granulation             Closure None  Drainage Amount Moderate  Drainage Description No odor;Serosanguineous  Treatment Cleansed;Debridement (Selective);Hydrotherapy (Pulse lavage);Packing (Saline gauze)  Dressing Type Moist to dry;Gauze (Comment) (ABD, kerlix ACE)  Dressing Changed Changed  Hydrotherapy  Pulsed Lavage with Suction (psi) 8 psi  Pulsed Lavage with Suction - Normal Saline Used 1000 mL  Pulsed Lavage Tip Tip with splash shield  Pulsed lavage therapy - wound location Left foot  Selective Debridement  Selective Debridement - Location Left foot wounds  Selective Debridement - Tools Used Forceps;Scissors  Selective Debridement - Tissue Removed necrotic tissue, yellow slough inside wounds, macerated, necrosing skin over outside of foot   Wound Therapy - Assess/Plan/Recommendations  Wound Therapy - Clinical Statement pt will benefit from hydrotherapy to facilitate wound healing through cleansing, debridement, dressing changes and education  Wound Therapy - Functional Problem List decr mobility  Factors Delaying/Impairing Wound Healing Altered sensation;Multiple medical problems;Infection - systemic/local;Diabetes Mellitus  Hydrotherapy Plan Debridement;Dressing change;Patient/family education;Pulsatile lavage with suction  Wound Therapy - Frequency 6X / week  Wound Therapy - Current Recommendations PT  Wound Therapy - Follow Up Recommendations Home health RN;Wound [place] will follow 6x/ wk for hydrotherapy  Wound Therapy Goals - Improve the function of patient's integumentary system by progressing the wound(s) through the phases of wound healing by:  Decrease Necrotic Tissue to 50  Decrease Necrotic Tissue - Progress Progressing toward goal  Increase Granulation Tissue to 50  Increase Granulation Tissue - Progress Progressing toward goal  Decrease Length/Width/Depth by (cm) 0/0/.2  Decrease Length/Width/Depth - Progress Progressing toward goal  Time For Goal Achievement 2 weeks  Wound Therapy - Potential for Goals Fair

## 2013-09-30 NOTE — Progress Notes (Signed)
[facility] PROGRESS NOTE  Mark [name] [id number] DOB: [birth year] DOA: 09/26/2013 PCP: No PCP Per Patient  Brief narrative  63 y.o. male with a past medical history of poorly controlled type 2 diabetes mellitus, medication nonadherence, presents as a direct admit from the urgent care Center. He complained of a two-week history of left foot pain, swelling, erythema which has significantly worsened in the last 2-3 days. He reports associated subjective fevers, chills, malaise, and feeling quite ill. He was found to be febrile at the urgent care center having a temperature 102.7 with lab work showed a white count of 26.8 and a glucose of 359. Patient admitted for sepsis due to cellulitis of left foot.   Assessment/Plan:  Sepsis due to Cellulitis of foot with abscess and concern for necrotizing fascitis  -Patient presented with marked leukocytosis and fever  - Xray of foot showed foreign body between 2nd and third toe and large amount of gas tracking along the foot concerning for necrotizing fascitis. An MRI of the foot was done which again showed cellulitis with multiple small abscesses and concern for necrotizing fascitis.  orthopedics consulted and patient taken to OR on 1/29 for I&D. As per ortho he had gross purulence throughout the left foot along plantar aspect and some dorsally. Recommend hydrotherapy which was started on 1/30. Blood and abscess culture negative. Further I&D per ortho  -IV hydration and pain control   Uncontrolled DM  A1C of 13.7 . reports not on any meds due to inability to afford. CM consulted. Will need insulin upon d/c.  FSG still elevated despite adjustment of insulin dose yesterday. Will increase the dose of lantus and aspart  further. [facility]. Will discharge on BID novolog.  -Emphasized on medication compliance.  Hypertension Will add lisinopril  Tobacco abuse  counseled on cessation   Hypokalemia   Replenished   Code Status: full   Family Communication:none   Disposition Plan:pending   Consultants:  Ortho  Procedures:  None   Antibiotics:  IV vanco and zosyn / cefepime ( 1/28>>)   HPI/Subjective:  Patient seen and examined this morning. Informs feeling better . Participating with PT   Objective: Filed Vitals:   09/30/13 0543  BP: 133/67  Pulse: 78  Temp: 98.1 F (36.7 C)  Resp: 18    Intake/Output Summary (Last 24 hours) at 09/30/13 1358 Last data filed at 09/30/13 0830  Gross per 24 hour  Intake 2004.7 ml  Output      0 ml  Net 2004.7 ml   Filed Weights   09/26/13 2359  Weight: 93.441 kg (206 lb)    Exam:  General: Elderly male lying in bed in no acute distress  HEENT: no pallor, moist oral mucosa  Chest: clear b/l, no added sounds  Abd: soft, NT, ND, BS+  Ext: Warm, dressing over  left foot  CNS: AAOX3   Data Reviewed: Basic Metabolic Panel:  Recent Labs Lab 09/26/13 2315 09/27/13 0508 09/28/13 0454 09/29/13 0540  NA 134* 134* 139 134*  K 3.4* 3.3* 3.1* 3.6*  CL 92* 93* 100 99  CO2 25 26 26 26   GLUCOSE 336* 310* 167* 257*  BUN 14 15 13 15   CREATININE 0.81 0.94 0.84 0.88  CALCIUM 8.5 8.5 8.1* 8.0*   Liver Function Tests:  Recent Labs Lab 09/27/13 0508  AST 22  ALT 26  ALKPHOS 182*  BILITOT 0.5  PROT 6.6  ALBUMIN 2.1*   No results found  for this basename: LIPASE, AMYLASE,  in the last 168 hours No results found for this basename: AMMONIA,  in the last 168 hours CBC:  Recent Labs Lab 09/26/13 2315 09/27/13 0508 09/28/13 0454 09/29/13 0540 09/30/13 0516  WBC 34.5* 37.3* 32.7* 21.1* 17.1*  NEUTROABS  --   --  28.2* 16.3* 12.0*  HGB 11.3* 11.1* 10.2* 10.2* 10.3*  HCT 33.3* 33.0* 30.7* 31.1* 30.8*  MCV 91.2 91.4 91.4 92.6 91.7  PLT 463* 471* 522* 545* 644*   Cardiac Enzymes: No results found for this basename: CKTOTAL, CKMB, CKMBINDEX, TROPONINI,  in the last 168 hours BNP (last 3 results) No results found  for this basename: PROBNP,  in the last 8760 hours CBG:  Recent Labs Lab 09/29/13 1150 09/29/13 1719 09/29/13 2050 09/30/13 0742 09/30/13 1232  GLUCAP 242* 207* 170* 282* 238*    Recent Results (from the past 240 hour(s))  CULTURE, BLOOD (ROUTINE X 2)     Status: None   Collection Time    09/26/13 11:15 PM      Result Value Range Status   Specimen Description BLOOD RIGHT ANTECUBITAL   Final   Special Requests BOTTLES DRAWN AEROBIC AND ANAEROBIC 5CC   Final   Culture  Setup Time     Final   Value: 09/27/2013 03:35     Performed at [facility]   Culture     Final   Value:        BLOOD CULTURE RECEIVED NO GROWTH TO DATE CULTURE WILL BE HELD FOR 5 DAYS BEFORE ISSUING A FINAL NEGATIVE REPORT     Performed at [facility]   Report Status PENDING   Incomplete  CULTURE, BLOOD (ROUTINE X 2)     Status: None   Collection Time    09/26/13 11:20 PM      Result Value Range Status   Specimen Description BLOOD RIGHT HAND   Final   Special Requests BOTTLES DRAWN AEROBIC AND ANAEROBIC 5CC   Final   Culture  Setup Time     Final   Value: 09/27/2013 03:34     Performed at [facility]   Culture     Final   Value:        BLOOD CULTURE RECEIVED NO GROWTH TO DATE CULTURE WILL BE HELD FOR 5 DAYS BEFORE ISSUING A FINAL NEGATIVE REPORT     Performed at [facility]   Report Status PENDING   Incomplete  SURGICAL PCR SCREEN     Status: Abnormal   Collection Time    09/27/13  4:17 PM      Result Value Range Status   MRSA, PCR NEGATIVE  NEGATIVE Final   Staphylococcus aureus POSITIVE (*) NEGATIVE Final   Comment:            The Xpert SA Assay (FDA     approved for NASAL specimens     in patients over 65 years of age),     is one component of     a comprehensive surveillance     program.  Test performance has     been validated by [employer] for patients greater     than or equal to 61 year old.     It is not intended     to diagnose infection nor to      guide or monitor treatment.  CULTURE, ROUTINE-ABSCESS     Status: None   Collection Time  09/27/13  6:05 PM      Result Value Range Status   Specimen Description ABSCESS LEFT FOOT   Final   Special Requests NONE   Final   Gram Stain     Final   Value: NO WBC SEEN     NO ORGANISMS SEEN     Performed by [facility] V RN AT 2031 ON 09/27/13 BY [facility] N     Performed at [facility]   Culture     Final   Value: MODERATE STAPHYLOCOCCUS SPECIES (COAGULASE NEGATIVE)     Performed at [facility]   Report Status 09/30/2013 FINAL   Final  ANAEROBIC CULTURE     Status: None   Collection Time    09/27/13  6:05 PM      Result Value Range Status   Specimen Description ABSCESS LEFT FOOT   Final   Special Requests NONE   Final   Gram Stain     Final   Value: NO WBC SEEN     NO ORGANISMS SEEN     Performed by [facility] V RN 2031 ON 09/27/13 BY HAMER N     Performed at [facility]   Culture     Final   Value: NO ANAEROBES ISOLATED; CULTURE IN PROGRESS FOR 5 DAYS     Performed at [facility]   Report Status PENDING   Incomplete  GRAM STAIN     Status: None   Collection Time    09/27/13  6:05 PM      Result Value Range Status   Specimen Description ABSCESS   Final   Special Requests LEFT FOOT   Final   Gram Stain     Final   Value: NO WBC SEEN     NO ORGANISMS SEEN     Gram Stain Report Called to,Read Back By and Verified With: [facility] RN 2031 [id number] HAMER,N   Report Status 09/27/2013 FINAL   Final     Studies: No results found.  Scheduled Meds: . ceFEPime (MAXIPIME) IV  2 g Intravenous Q8H  . insulin aspart  0-15 Units Subcutaneous TID WC  . insulin aspart  0-5 Units Subcutaneous QHS  . insulin aspart  4 Units Subcutaneous TID WC  . insulin glargine  25 Units Subcutaneous QHS  . vancomycin  1,500 mg Intravenous Q12H   Continuous Infusions: . sodium chloride 125 mL/hr at 09/30/13 0648       Time spent: 25 minutes    Aslynn Brunetti  [facility] Pager [phone]. If 7PM-7AM, please contact night-coverage at [email or web address], password [place] 09/30/2013, 1:58 PM  LOS: 4 days

## 2013-10-01 DIAGNOSIS — D638 Anemia in other chronic diseases classified elsewhere: Secondary | ICD-10-CM | POA: Diagnosis present

## 2013-10-01 DIAGNOSIS — D649 Anemia, unspecified: Secondary | ICD-10-CM

## 2013-10-01 HISTORY — DX: Anemia, unspecified: D64.9

## 2013-10-01 LAB — GLUCOSE, CAPILLARY
Glucose-Capillary: 216 mg/dL — ABNORMAL HIGH (ref 70–99)
Glucose-Capillary: 142 mg/dL — ABNORMAL HIGH (ref 70–99)
Glucose-Capillary: 141 mg/dL — ABNORMAL HIGH (ref 70–99)
Glucose-Capillary: 128 mg/dL — ABNORMAL HIGH (ref 70–99)

## 2013-10-01 LAB — CBC WITH DIFFERENTIAL/PLATELET
Basophils Absolute: 0 10*3/uL (ref 0.0–0.1)
Basophils Relative: 0 % (ref 0–1)
Eosinophils Absolute: 0.3 10*3/uL (ref 0.0–0.7)
Eosinophils Relative: 2 % (ref 0–5)
HCT: 30.7 % — ABNORMAL LOW (ref 39.0–52.0)
Hemoglobin: 10.1 g/dL — ABNORMAL LOW (ref 13.0–17.0)
Lymphocytes Relative: 13 % (ref 12–46)
Lymphs Abs: 2.2 10*3/uL (ref 0.7–4.0)
MCH: 30.1 pg (ref 26.0–34.0)
MCHC: 32.9 g/dL (ref 30.0–36.0)
MCV: 91.4 fL (ref 78.0–100.0)
Monocytes Absolute: 1.7 10*3/uL — ABNORMAL HIGH (ref 0.1–1.0)
Monocytes Relative: 10 % (ref 3–12)
Neutro Abs: 12.6 10*3/uL — ABNORMAL HIGH (ref 1.7–7.7)
Neutrophils Relative %: 75 % (ref 43–77)
Platelets: 653 10*3/uL — ABNORMAL HIGH (ref 150–400)
RBC: 3.36 MIL/uL — ABNORMAL LOW (ref 4.22–5.81)
RDW: 13.2 % (ref 11.5–15.5)
WBC: 16.8 10*3/uL — ABNORMAL HIGH (ref 4.0–10.5)

## 2013-10-01 LAB — IRON AND TIBC
Iron: 10 ug/dL — ABNORMAL LOW (ref 42–135)
Saturation Ratios: 7 % — ABNORMAL LOW (ref 20–55)
TIBC: 134 ug/dL — ABNORMAL LOW (ref 215–435)
UIBC: 124 ug/dL — ABNORMAL LOW (ref 125–400)

## 2013-10-01 LAB — VITAMIN B12: Vitamin B-12: 854 pg/mL (ref 211–911)

## 2013-10-01 MED ORDER — LISINOPRIL 20 MG PO TABS
20.0000 mg | ORAL_TABLET | Freq: Every day | ORAL | Status: DC
  Administered 2013-10-02 – 2013-10-09 (×8): 20 mg via ORAL
  Filled 2013-10-01 (×8): qty 1

## 2013-10-01 MED ORDER — INSULIN ASPART 100 UNIT/ML ~~LOC~~ SOLN
9.0000 [IU] | Freq: Three times a day (TID) | SUBCUTANEOUS | Status: DC
  Administered 2013-10-01 – 2013-10-04 (×7): 9 [IU] via SUBCUTANEOUS

## 2013-10-01 MED ORDER — INSULIN GLARGINE 100 UNIT/ML ~~LOC~~ SOLN
36.0000 [IU] | Freq: Every day | SUBCUTANEOUS | Status: DC
  Administered 2013-10-01 – 2013-10-02 (×2): 36 [IU] via SUBCUTANEOUS
  Filled 2013-10-01 (×3): qty 0.36

## 2013-10-01 MED ORDER — HYDRALAZINE HCL 20 MG/ML IJ SOLN
10.0000 mg | Freq: Four times a day (QID) | INTRAMUSCULAR | Status: DC | PRN
  Filled 2013-10-01: qty 0.5

## 2013-10-01 NOTE — Progress Notes (Signed)
Subjective: 4 Days Post-Op Procedure(s) (LRB): IRRIGATION AND DEBRIDEMENT EXTREMITY (Left) Patient reports pain as mild.  States foot overall feels better.  Objective: Vital signs in last 24 hours: Temp:  [98.2 F (36.8 C)-100.3 F (37.9 C)] 99 F (37.2 C) (02/02 0352) Pulse Rate:  [74-86] 74 (02/02 0352) Resp:  [16-18] 16 (02/02 0352) BP: (127-165)/(72-89) 127/72 mmHg (02/02 0352) SpO2:  [91 %-96 %] 94 % (02/02 0352)  Intake/Output from previous day: 02/01 0701 - 02/02 0700 In: 3045 [P.O.:1320; I.V.:1725] Out: -  Intake/Output this shift:     Recent Labs  09/29/13 0540 09/30/13 0516 10/01/13 0505  HGB 10.2* 10.3* 10.1*    Recent Labs  09/30/13 0516 10/01/13 0505  WBC 17.1* 16.8*  RBC 3.36* 3.36*  HCT 30.8* 30.7*  PLT 644* 653*    Recent Labs  09/29/13 0540  NA 134*  K 3.6*  CL 99  CO2 26  BUN 15  CREATININE 0.88  GLUCOSE 257*  CALCIUM 8.0*   No results found for this basename: LABPT, INR,  in the last 72 hours  Incision: dressing C/D/I Compartment soft Third toe dark necrotic appearing  Assessment/Plan: 4 Days Post-Op Procedure(s) (LRB): IRRIGATION AND DEBRIDEMENT EXTREMITY (Left) Bedside hydro therapy today  Patient will need to return to OR tomorrow for repeat I&D left fooot and amputation of Left third toe.  Erskine Emery 10/01/2013, 7:56 AM

## 2013-10-01 NOTE — Progress Notes (Signed)
09/30/13 2017  What Happened  Was fall witnessed? No  Was patient injured? No  Patient found on floor;in bathroom  Found by Staff-comment (two RNs heard fall from outside pt door and walked in)  Stated prior activity other (comment) (Pt was brought to bathroom by RN, told to call when finished)  Follow Up  MD notified Yes  Time MD notified 2030  Family notified Yes-comment (Family in room at time of fall)  Time family notified 2020  Additional tests No  Simple treatment Other (comment) (Pt assessed and denied injury/need for any further treament)  Progress note created (see row info) Yes  Fall Risk Assessment  Risk Factor Category (scoring not indicated) Fall has occurred during this admission (document High fall risk)

## 2013-10-01 NOTE — Progress Notes (Signed)
10/01/13 1400-1435   Subjective Assessment  Subjective i need to get back in bed,how does my foot look?  Patient and Family Stated Goals save my foot  Date of Onset 09/18/13  Prior Treatments I and D 1.29/15  Evaluation and Treatment  Evaluation and Treatment Procedures Explained to Patient/Family Yes  Evaluation and Treatment Procedures agreed to  Wound 09/28/13 Other (Comment) Foot Left s/p I and D Left foot 09/27/13  Date First Assessed/Time First Assessed: 09/28/13 1254   Wound Type: Other (Comment)  Location: Foot  Location Orientation: Left  Wound Description (Comments): s/p I and D Left foot 09/27/13  Present on Admission: (c)   Site / Wound Assessment Granulation tissue;Black;Purple;Red;Yellow  % Wound base Red or Granulating (*see note*)  Closure None  Drainage Amount Moderate  Drainage Description Serosanguineous;Odor  Treatment Cleansed  Dressing Type Moist to dry;Gauze (Comment) (ABD, kerlix ACE)  Dressing Changed Changed  Hydrotherapy  Pulsed Lavage with Suction (psi) 8 psi  Pulsed Lavage with Suction - Normal Saline Used 1000 mL  Pulsed Lavage Tip Tip with splash shield  Pulsed lavage therapy - wound location Left foot  Selective Debridement  Selective Debridement - Location Left foot wounds  Wound Therapy - Assess/Plan/Recommendations  Wound Therapy - Clinical Statement Wounds have foul odor today. Pt to return to OR tomorrow for Middle toe amputation and I & D. will need MD to clarify continuation of PLS post op.  Wound Therapy - Functional Problem List decr mobility  Factors Delaying/Impairing Wound Healing Altered sensation;Multiple medical problems;Infection - systemic/local;Diabetes Mellitus  Hydrotherapy Plan Debridement;Dressing change;Patient/family education;Pulsatile lavage with suction  Wound Therapy - Frequency 6X / week  Wound Therapy - Current Recommendations PT  Wound Therapy - Follow Up Recommendations Home health RN;Wound Lillie  will follow 6x/ wk for hydrotherapy  Wound Therapy Goals - Improve the function of patient's integumentary system by progressing the wound(s) through the phases of wound healing by:  Decrease Necrotic Tissue to 50  Decrease Necrotic Tissue - Progress Progressing toward goal  Increase Granulation Tissue to 50  Increase Granulation Tissue - Progress Progressing toward goal  Decrease Length/Width/Depth by (cm) 0/0/.2  Decrease Length/Width/Depth - Progress Progressing toward goal  Time For Goal Achievement 2 weeks  Wound Therapy - Potential for Goals Apolonio Schneiders PT [phone]

## 2013-10-01 NOTE — Progress Notes (Signed)
[facility] PROGRESS NOTE  Mark [name] [id number] DOB: [birth year] DOA: 09/26/2013 PCP: No PCP Per Patient  Brief narrative  63 y.o. male with a past medical history of poorly controlled type 2 diabetes mellitus, medication nonadherence, presents as a direct admit from the urgent care Center. He complained of a two-week history of left foot pain, swelling, erythema which has significantly worsened in the last 2-3 days. He reports associated subjective fevers, chills, malaise, and feeling quite ill. He was found to be febrile at the urgent care center having a temperature 102.7 with lab work showed a white count of 26.8 and a glucose of 359. Patient admitted for sepsis due to cellulitis of left foot.   Assessment/Plan:  Sepsis due to Cellulitis of foot with abscess and concern for necrotizing fascitis  -Patient presented with marked leukocytosis and fever  - Xray of foot showed foreign body between 2nd and third toe and large amount of gas tracking along the foot concerning for necrotizing fascitis. An MRI of the foot was done which again showed cellulitis with multiple small abscesses and concern for necrotizing fascitis.  orthopedics consulted and patient taken to OR on 1/29 for I&D. As per ortho he had gross purulence throughout the left foot along plantar aspect and some dorsally.  -hydrotherapy started on 1/30. Blood and abscess culture negative.  -Further I&D and amputation of left third toe planned for  tomorrow. -pain control   Uncontrolled DM  A1C of 13.7 . reports not on any meds due to inability to afford. CM consulted. Will need insulin upon d/c.  FSG better after increasing the dose of lantus and aspart further. Will adjust further today.[facility] Will discharge on BID novolog.  -Emphasized on medication compliance.   Hypertension  added lisinopril . Will increase dose. Add prn hydralazine    Tobacco abuse  counseled  on cessation    Hypokalemia  Replenished   Malnutrition  nutrition consulted   Code Status: full    Family Communication:none    Disposition Plan:pending    Consultants:  Ortho    Procedures:  None  Antibiotics:  IV vanco and zosyn / cefepime ( 1/28>>)  HPI/Subjective:  Patient seen and examined this morning. Slipped in the bathroom last night but did not sustain any injury   Objective: Filed Vitals:   10/01/13 0946  BP: 145/76  Pulse: 81  Temp: 98.9 F (37.2 C)  Resp: 17    Intake/Output Summary (Last 24 hours) at 10/01/13 1148 Last data filed at 10/01/13 1136  Gross per 24 hour  Intake   2685 ml  Output   1150 ml  Net   1535 ml   Filed Weights   09/26/13 2359  Weight: 93.441 kg (206 lb)    Exam:  General: Elderly male lying in bed in no acute distress  HEENT: no pallor, moist oral mucosa  Chest: clear b/l, no added sounds  Abd: soft, NT, ND, BS+  Ext: Warm, dressing over left foot  CNS: AAOX3   Data Reviewed: Basic Metabolic Panel:  Recent Labs Lab 09/26/13 2315 09/27/13 0508 09/28/13 0454 09/29/13 0540  NA 134* 134* 139 134*  K 3.4* 3.3* 3.1* 3.6*  CL 92* 93* 100 99  CO2 25 26 26 26   GLUCOSE 336* 310* 167* 257*  BUN 14 15 13 15   CREATININE 0.81 0.94 0.84 0.88  CALCIUM 8.5 8.5 8.1* 8.0*   Liver Function Tests:  Recent Labs Lab 09/27/13 [id number]  AST 22  ALT 26  ALKPHOS 182*  BILITOT 0.5  PROT 6.6  ALBUMIN 2.1*   No results found for this basename: LIPASE, AMYLASE,  in the last 168 hours No results found for this basename: AMMONIA,  in the last 168 hours CBC:  Recent Labs Lab 09/27/13 0508 09/28/13 0454 09/29/13 0540 09/30/13 0516 10/01/13 0505  WBC 37.3* 32.7* 21.1* 17.1* 16.8*  NEUTROABS  --  28.2* 16.3* 12.0* 12.6*  HGB 11.1* 10.2* 10.2* 10.3* 10.1*  HCT 33.0* 30.7* 31.1* 30.8* 30.7*  MCV 91.4 91.4 92.6 91.7 91.4  PLT 471* 522* 545* 644* 653*   Cardiac Enzymes: No results found for this basename:  CKTOTAL, CKMB, CKMBINDEX, TROPONINI,  in the last 168 hours BNP (last 3 results) No results found for this basename: PROBNP,  in the last 8760 hours CBG:  Recent Labs Lab 09/30/13 1232 09/30/13 1651 09/30/13 2023 10/01/13 0720 10/01/13 1126  GLUCAP 238* 188* 157* 216* 142*    Recent Results (from the past 240 hour(s))  CULTURE, BLOOD (ROUTINE X 2)     Status: None   Collection Time    09/26/13 11:15 PM      Result Value Range Status   Specimen Description BLOOD RIGHT ANTECUBITAL   Final   Special Requests BOTTLES DRAWN AEROBIC AND ANAEROBIC 5CC   Final   Culture  Setup Time     Final   Value: 09/27/2013 03:35     Performed at [facility]   Culture     Final   Value:        BLOOD CULTURE RECEIVED NO GROWTH TO DATE CULTURE WILL BE HELD FOR 5 DAYS BEFORE ISSUING A FINAL NEGATIVE REPORT     Performed at [facility]   Report Status PENDING   Incomplete  CULTURE, BLOOD (ROUTINE X 2)     Status: None   Collection Time    09/26/13 11:20 PM      Result Value Range Status   Specimen Description BLOOD RIGHT HAND   Final   Special Requests BOTTLES DRAWN AEROBIC AND ANAEROBIC 5CC   Final   Culture  Setup Time     Final   Value: 09/27/2013 03:34     Performed at [facility]   Culture     Final   Value:        BLOOD CULTURE RECEIVED NO GROWTH TO DATE CULTURE WILL BE HELD FOR 5 DAYS BEFORE ISSUING A FINAL NEGATIVE REPORT     Performed at [facility]   Report Status PENDING   Incomplete  SURGICAL PCR SCREEN     Status: Abnormal   Collection Time    09/27/13  4:17 PM      Result Value Range Status   MRSA, PCR NEGATIVE  NEGATIVE Final   Staphylococcus aureus POSITIVE (*) NEGATIVE Final   Comment:            The Xpert SA Assay (FDA     approved for NASAL specimens     in patients over 50 years of age),     is one component of     a comprehensive surveillance     program.  Test performance has     been validated by [employer] for patients  greater     than or equal to 61 year old.     It is not intended     to diagnose infection nor to  guide or monitor treatment.  CULTURE, ROUTINE-ABSCESS     Status: None   Collection Time    09/27/13  6:05 PM      Result Value Range Status   Specimen Description ABSCESS LEFT FOOT   Final   Special Requests NONE   Final   Gram Stain     Final   Value: NO WBC SEEN     NO ORGANISMS SEEN     Performed by [facility] [place] V RN AT 2031 ON 09/27/13 BY HAMER N     Performed at [facility]   Culture     Final   Value: MODERATE STAPHYLOCOCCUS SPECIES (COAGULASE NEGATIVE)     Performed at [facility]   Report Status 09/30/2013 FINAL   Final  ANAEROBIC CULTURE     Status: None   Collection Time    09/27/13  6:05 PM      Result Value Range Status   Specimen Description ABSCESS LEFT FOOT   Final   Special Requests NONE   Final   Gram Stain     Final   Value: NO WBC SEEN     NO ORGANISMS SEEN     Performed by [facility] V RN 2031 ON 09/27/13 BY HAMER N     Performed at [facility]   Culture     Final   Value: NO ANAEROBES ISOLATED; CULTURE IN PROGRESS FOR 5 DAYS     Performed at [facility]   Report Status PENDING   Incomplete  GRAM STAIN     Status: None   Collection Time    09/27/13  6:05 PM      Result Value Range Status   Specimen Description ABSCESS   Final   Special Requests LEFT FOOT   Final   Gram Stain     Final   Value: NO WBC SEEN     NO ORGANISMS SEEN     Gram Stain Report Called to,Read Back By and Verified With: [facility] RN 2031 [id number] HAMER,N   Report Status 09/27/2013 FINAL   Final     Studies: No results found.  Scheduled Meds: . ceFEPime (MAXIPIME) IV  2 g Intravenous Q8H  . insulin aspart  0-15 Units Subcutaneous TID WC  . insulin aspart  0-5 Units Subcutaneous QHS  . insulin aspart  7 Units Subcutaneous TID WC  . insulin glargine  32 Units Subcutaneous QHS  . lisinopril  10 mg  Oral Daily  . vancomycin  1,500 mg Intravenous Q12H   Continuous Infusions:     Time spent: 25 minutes    Shalissa Easterwood  [facility] Pager [phone]. If 7PM-7AM, please contact night-coverage at [email or web address], password [facility] 10/01/2013, 11:48 AM  LOS: 5 days

## 2013-10-01 NOTE — Progress Notes (Signed)
Nutrition Brief Note  Received consult for assessment of nutritional status. Attempted to see pt however pt was asleep. Per discussion with RN, pt had recently received pain medicine. Will follow up with pt tomorrow. Diabetic diet handouts left for pt to review, RN aware.   Mikey College MS, [place], [place] Pager [phone] After Hours Pager

## 2013-10-02 ENCOUNTER — Encounter (HOSPITAL_COMMUNITY)
Admission: AD | Disposition: A | Discharge status: Rehabilitation Facility | Payer: Self-pay | Source: Ambulatory Visit | Attending: Internal Medicine

## 2013-10-02 ENCOUNTER — Inpatient Hospital Stay (HOSPITAL_COMMUNITY): Payer: Medicaid Other | Admitting: Anesthesiology

## 2013-10-02 ENCOUNTER — Encounter (HOSPITAL_COMMUNITY): Payer: Self-pay | Admitting: Certified Registered"

## 2013-10-02 ENCOUNTER — Encounter (HOSPITAL_COMMUNITY): Payer: Medicaid Other | Admitting: Anesthesiology

## 2013-10-02 HISTORY — PX: I & D EXTREMITY: SHX5045

## 2013-10-02 HISTORY — PX: AMPUTATION: SHX166

## 2013-10-02 LAB — GLUCOSE, CAPILLARY
Glucose-Capillary: 181 mg/dL — ABNORMAL HIGH (ref 70–99)
Glucose-Capillary: 116 mg/dL — ABNORMAL HIGH (ref 70–99)
Glucose-Capillary: 122 mg/dL — ABNORMAL HIGH (ref 70–99)
Glucose-Capillary: 125 mg/dL — ABNORMAL HIGH (ref 70–99)
Glucose-Capillary: 124 mg/dL — ABNORMAL HIGH (ref 70–99)

## 2013-10-02 LAB — ANAEROBIC CULTURE: Gram Stain: NONE SEEN

## 2013-10-02 LAB — CREATININE, SERUM
Creatinine, Ser: 0.76 mg/dL (ref 0.50–1.35)
GFR calc Af Amer: >90 mL/min (ref >90)
GFR calc non Af Amer: >90 mL/min (ref >90)

## 2013-10-02 LAB — CBC
HCT: 31.6 % — ABNORMAL LOW (ref 39.0–52.0)
Hemoglobin: 10.6 g/dL — ABNORMAL LOW (ref 13.0–17.0)
MCH: 30.7 pg (ref 26.0–34.0)
MCHC: 33.5 g/dL (ref 30.0–36.0)
MCV: 91.6 fL (ref 78.0–100.0)
Platelets: 718 10*3/uL — ABNORMAL HIGH (ref 150–400)
RBC: 3.45 MIL/uL — ABNORMAL LOW (ref 4.22–5.81)
RDW: 13.3 % (ref 11.5–15.5)
WBC: 16.7 10*3/uL — ABNORMAL HIGH (ref 4.0–10.5)

## 2013-10-02 LAB — CLOSTRIDIUM DIFFICILE BY PCR: Toxigenic C. Difficile by PCR: NEGATIVE

## 2013-10-02 LAB — VANCOMYCIN, TROUGH: Vancomycin Tr: 16.2 ug/mL (ref 10.0–20.0)

## 2013-10-02 SURGERY — AMPUTATION DIGIT
Anesthesia: General | Site: Foot | Laterality: Left

## 2013-10-02 MED ORDER — SUCCINYLCHOLINE CHLORIDE 20 MG/ML IJ SOLN
INTRAMUSCULAR | Status: DC | PRN
  Administered 2013-10-02: 100 mg via INTRAVENOUS

## 2013-10-02 MED ORDER — ONDANSETRON HCL 4 MG/2ML IJ SOLN
INTRAMUSCULAR | Status: DC | PRN
  Administered 2013-10-02: 4 mg via INTRAVENOUS

## 2013-10-02 MED ORDER — EPHEDRINE SULFATE 50 MG/ML IJ SOLN
INTRAMUSCULAR | Status: AC
  Filled 2013-10-02: qty 1

## 2013-10-02 MED ORDER — SODIUM CHLORIDE 0.9 % IV SOLN: INTRAVENOUS | Status: DC

## 2013-10-02 MED ORDER — MIDAZOLAM HCL 2 MG/2ML IJ SOLN
INTRAMUSCULAR | Status: AC
  Filled 2013-10-02: qty 2

## 2013-10-02 MED ORDER — ROCURONIUM BROMIDE 100 MG/10ML IV SOLN
INTRAVENOUS | Status: AC
  Filled 2013-10-02: qty 1

## 2013-10-02 MED ORDER — PROPOFOL 10 MG/ML IV BOLUS
INTRAVENOUS | Status: DC | PRN
  Administered 2013-10-02: 150 mg via INTRAVENOUS

## 2013-10-02 MED ORDER — FENTANYL CITRATE 0.05 MG/ML IJ SOLN
INTRAMUSCULAR | Status: DC | PRN
  Administered 2013-10-02: 50 ug via INTRAVENOUS
  Administered 2013-10-02: 100 ug via INTRAVENOUS
  Administered 2013-10-02 (×2): 50 ug via INTRAVENOUS

## 2013-10-02 MED ORDER — SODIUM CHLORIDE 0.9 % IV SOLN
INTRAVENOUS | Status: DC | PRN
  Administered 2013-10-02: 20:00:00 via INTRAVENOUS

## 2013-10-02 MED ORDER — ONDANSETRON HCL 4 MG/2ML IJ SOLN
INTRAMUSCULAR | Status: AC
  Filled 2013-10-02: qty 2

## 2013-10-02 MED ORDER — HYDROMORPHONE HCL PF 1 MG/ML IJ SOLN: 0.2500 mg | INTRAMUSCULAR | Status: DC | PRN

## 2013-10-02 MED ORDER — 0.9 % SODIUM CHLORIDE (POUR BTL) OPTIME
TOPICAL | Status: DC | PRN
  Administered 2013-10-02: 1000 mL

## 2013-10-02 MED ORDER — SODIUM CHLORIDE 0.9 % IR SOLN
Status: DC | PRN
  Administered 2013-10-02: 3000 mL

## 2013-10-02 MED ORDER — EPHEDRINE SULFATE 50 MG/ML IJ SOLN
INTRAMUSCULAR | Status: DC | PRN
  Administered 2013-10-02: 10 mg via INTRAVENOUS

## 2013-10-02 MED ORDER — BUPIVACAINE HCL (PF) 0.5 % IJ SOLN
INTRAMUSCULAR | Status: AC
  Filled 2013-10-02: qty 30

## 2013-10-02 MED ORDER — PROPOFOL 10 MG/ML IV BOLUS
INTRAVENOUS | Status: AC
  Filled 2013-10-02: qty 20

## 2013-10-02 MED ORDER — FENTANYL CITRATE 0.05 MG/ML IJ SOLN
INTRAMUSCULAR | Status: AC
  Filled 2013-10-02: qty 5

## 2013-10-02 MED ORDER — MIDAZOLAM HCL 5 MG/5ML IJ SOLN
INTRAMUSCULAR | Status: DC | PRN
  Administered 2013-10-02: 2 mg via INTRAVENOUS

## 2013-10-02 MED ORDER — SODIUM CHLORIDE 0.9 % IV SOLN: INTRAVENOUS | Status: DC | PRN

## 2013-10-02 MED ORDER — FERROUS GLUCONATE 324 (38 FE) MG PO TABS
324.0000 mg | ORAL_TABLET | Freq: Two times a day (BID) | ORAL | Status: DC
  Administered 2013-10-03 – 2013-10-09 (×12): 324 mg via ORAL
  Filled 2013-10-02 (×17): qty 1

## 2013-10-02 MED ORDER — PROMETHAZINE HCL 25 MG/ML IJ SOLN
6.2500 mg | INTRAMUSCULAR | Status: DC | PRN
Start: 2013-10-02 — End: 2013-10-02

## 2013-10-02 MED ORDER — INFLUENZA VAC SPLIT QUAD 0.5 ML IM SUSP
0.5000 mL | INTRAMUSCULAR | Status: DC
  Filled 2013-10-02 (×2): qty 0.5

## 2013-10-02 SURGICAL SUPPLY — 17 items
BLADE OSCILLATING/SAGITTAL (BLADE) ×2
BLADE SW THK.38XMED LNG THN (BLADE) IMPLANT
BNDG COHESIVE 6X5 TAN STRL LF (GAUZE/BANDAGES/DRESSINGS) ×1 IMPLANT
BNDG GAUZE ELAST 4 BULKY (GAUZE/BANDAGES/DRESSINGS) ×2 IMPLANT
GAUZE XEROFORM 5X9 LF (GAUZE/BANDAGES/DRESSINGS) ×1 IMPLANT
GLOVE BIO SURGEON STRL SZ7.5 (GLOVE) ×3 IMPLANT
GLOVE BIOGEL PI IND STRL 8 (GLOVE) ×1 IMPLANT
GLOVE BIOGEL PI INDICATOR 8 (GLOVE) ×2
GLOVE ECLIPSE 8.0 STRL XLNG CF (GLOVE) ×3 IMPLANT
GOWN STRL REUS W/TWL XL LVL3 (GOWN DISPOSABLE) ×3 IMPLANT
PAD ABD 8X10 STRL (GAUZE/BANDAGES/DRESSINGS) ×2 IMPLANT
POSITIONER SURGICAL ARM (MISCELLANEOUS) ×2 IMPLANT
SPONGE GAUZE 4X4 12PLY (GAUZE/BANDAGES/DRESSINGS) ×1 IMPLANT
SPONGE LAP 18X18 X RAY DECT (DISPOSABLE) ×1 IMPLANT
SUT ETHILON 2 0 PS N (SUTURE) ×4 IMPLANT
SUT ETHILON 2 0 PSLX (SUTURE) ×3 IMPLANT
TOWEL OR 17X26 10 PK STRL BLUE (TOWEL DISPOSABLE) ×4 IMPLANT

## 2013-10-02 NOTE — Progress Notes (Addendum)
MEDICATION RELATED CONSULT NOTE - DM  Recommendations for Diabetes Treatment and Management Indication: need for DM teaching  No Known Allergies  Patient Measurements: Height: 6\' 1"  (185.4 cm) Weight: 206 lb (93.441 kg) IBW/kg (Calculated) : 79.9    Intake/Output from previous day: 02/02 0701 - 02/03 0700 In: 240 [P.O.:240] Out: 2050 [Urine:2050] Intake/Output from this shift: Total I/O In: 240 [P.O.:240] Out: 400 [Urine:400]  Labs:  Recent Labs  09/30/13 0516 10/01/13 0505 10/02/13 0545 10/02/13 0929  WBC 17.1* 16.8* 16.7*  --   HGB 10.3* 10.1* 10.6*  --   HCT 30.8* 30.7* 31.6*  --   PLT 644* 653* 718*  --   CREATININE  --   --   --  0.76   Estimated Creatinine Clearance: 109.6 ml/min (by C-G formula based on Cr of 0.76).  Medical History: Past Medical History  Diagnosis Date  . Diabetes mellitus without complication   . Diverticulitis    Medications:  Scheduled:  . ceFEPime (MAXIPIME) IV  2 g Intravenous Q8H  . ferrous gluconate  324 mg Oral BID WC  . [START ON 10/03/2013] influenza vac split quadrivalent PF  0.5 mL Intramuscular Tomorrow-1000  . insulin aspart  0-15 Units Subcutaneous TID WC  . insulin aspart  0-5 Units Subcutaneous QHS  . insulin aspart  9 Units Subcutaneous TID WC  . insulin glargine  36 Units Subcutaneous QHS  . lisinopril  20 mg Oral Daily  . vancomycin  1,500 mg Intravenous Q12H    Assessment: T2DM, A1c13.7%, uncontrolled d/t nonadherence to DM medications. SCr 0.76 [facility]mitted to [facility] d/t OM of L foot, c/o pain X 2 weeks. (I&D, amputation 3rd toe likely). Foreign body removed.  Pt reports previous tx with insulin therapy but d/c d/t inability to afford medications. Hospital tx with novolog 9 units TID + SSI and SSI at HS. Lantus 36 units q day with fasting glucose (10/02/13) of 181. Pt also receiving lisinopril for renal protection/hypertension.  Plan:  Recommend pt begin ASA 81 mg PO q day and atorvastatin 40 mg q day for  cardiovascular risk mitigation; Metformin 500 mg BID X 1-2 weeks, increase 500 mg q week to 1000 mg BID. Due to inability to pay for previous insulin, recommend NPH or R for insulin requirements, adjusting based on glycemic needs. Can also recommend for medication assistance program through county if still unable to afford medications. Reinforce smoking cessation necessity. Recommend pneumonia vaccine (PPSV23) d/t smoking status. Recommend HepB d/t DM. Recommend TG/fasting lipid panel, microalbumin and monofilament neuropathic foot exam. Counsel patient on diet/exercise. Recommend a SMBG meter and educate on proper use. Recommend a yearly eye exam, assist in acquiring ophth. And reinforce pt check feet daily.  Pharmacy to counsel patient on new medications, SMBG, foot exams, diet/exercise before d/c.  [name], PharmD Candidate 10/02/2013,11:26 AM  Agree with above assessment and counseling plan by PharmD candidate.  Minda Ditto PharmD Pager [phone] 10/02/2013, 1:19 PM   10/05/13 Addendum:  Pt reported adverse events related to metformin IR. Recommend metformin XR, which is on [facility]'s $4 list. Also recommend pravastatin 40 mg, which is available at 90d supply for $12 at [facility]. Also recommend continue current dose of lisinopril, which is available at [facility] for $4. Recommended OTC 81 mg ASA. Spoke with patient about [employer] Card through [place] and printed information through [place] to get Lantus and/or Novolog direct from manufacturer if he is unable to afford 70/30 from [place]. Counseled patient on  diet/exercise/medication triangle of diabetes and the impact on other areas of his health. Will return to discuss self foot exams.  [name], PharmD Candidate 3:22 PM

## 2013-10-02 NOTE — Anesthesia Preprocedure Evaluation (Addendum)
Anesthesia Evaluation  Patient identified by MRN, date of birth, ID band Patient awake    Reviewed: Allergy & Precautions, H&P , NPO status , Patient's Chart, lab work & pertinent test results  Airway Mallampati: II TM Distance: >3 FB Neck ROM: Full    Dental no notable dental hx.    Pulmonary Current Smoker,  breath sounds clear to auscultation  Pulmonary exam normal       Cardiovascular negative cardio ROS  Rhythm:Regular Rate:Normal     Neuro/Psych negative neurological ROS  negative psych ROS   GI/Hepatic Neg liver ROS, GERD-  Poorly Controlled,  Endo/Other  diabetes, Poorly Controlled, Insulin Dependent  Renal/GU negative Renal ROS  negative genitourinary   Musculoskeletal negative musculoskeletal ROS (+)   Abdominal   Peds negative pediatric ROS (+)  Hematology  (+) anemia ,   Anesthesia Other Findings   Reproductive/Obstetrics negative OB ROS                          Anesthesia Physical Anesthesia Plan  ASA: III  Anesthesia Plan: General   Post-op Pain Management:    Induction: Intravenous  Airway Management Planned: Oral ETT  Additional Equipment:   Intra-op Plan:   Post-operative Plan: Extubation in OR  Informed Consent: I have reviewed the patients History and Physical, chart, labs and discussed the procedure including the risks, benefits and alternatives for the proposed anesthesia with the patient or authorized representative who has indicated his/her understanding and acceptance.   Dental advisory given  Plan Discussed with: CRNA and Surgeon  Anesthesia Plan Comments:        Anesthesia Quick Evaluation

## 2013-10-02 NOTE — Progress Notes (Signed)
ANTIBIOTIC CONSULT NOTE - FOLLOW UP  Pharmacy Consult for Vancomycin, Cefepime Indication: sepsis 2/2 cellulitis of foot with abscess and concern for necrotizing fascitis  No Known Allergies  Patient Measurements: Height: 6\' 1"  (185.4 cm) Weight: 206 lb (93.441 kg) IBW/kg (Calculated) : 79.9  Vital Signs:   Intake/Output from previous day: 02/02 0701 - 02/03 0700 In: 240 [P.O.:240] Out: 2050 [Urine:2050] Intake/Output from this shift: Total I/O In: 240 [P.O.:240] Out: 400 [Urine:400]  Labs:  Recent Labs  09/30/13 0516 10/01/13 0505 10/02/13 0545 10/02/13 0929  WBC 17.1* 16.8* 16.7*  --   HGB 10.3* 10.1* 10.6*  --   PLT 644* 653* 718*  --   CREATININE  --   --   --  0.76   Estimated Creatinine Clearance: 109.6 ml/min (by C-G formula based on Cr of 0.76).  Recent Labs  09/29/13 1109 10/02/13 0929  VANCOTROUGH 12.7 16.2     Assessment: 69 yoM with PMH of poorly controlled T2DM, medication nonadherence, presented 1/28 as a direct admit from the urgent care center. He complained of a two-week history of left foot pain, swelling, erythema which has significantly worsened. Pt with evidence of sepsis 2/2 cellulitis, Vanc and Zosyn started. Xray of foot showed foreign body between 2nd and third toe and large amount of gas tracking along the foot concerning for necrotizing fascitis. An MRI of the foot was done which again showed cellulitis with multiple small abscesses and concern for necrotizing fascitis.   1/28 >> zosyn >> 1/29 1/ 29>> vancomycin >>  1/29 >> cefepime >>  Tmax: afeb WBCs: slowly improving to 16.7K Renal: SCr 0.88, CG 100, N 90  1/28 blood >> NGTD  1/29 abscess >> CoNS 2/2 C.diff PCR >> ordered   Dose changes/drug level info:  1/31 VT= 12.7 on 1250 q12h, incr to 1500 mg q12h  Today is D#5 of Vancomycin and Cefepime. Pt underwent I&D on 1/29: removal metal foreign body. Plan return to OR today for repeat I&D of L foot and amputation of L 3rd toe.    Vancomycin trough therapeutic today at 16.2. Scr 0.76/stable.   Goal of Therapy:  Vancomycin trough level 15-20 mcg/ml  Plan:   Continue Vancomycin 1500 mg IV q12h  Continue Cefepime 2g IV q8h  MD, please re-assess if anaerobic coverage is needed   Vanessa Iroquois, PharmD, BCPS Pager: [phone] 10:31 AM Pharmacy #: [id number]

## 2013-10-02 NOTE — Progress Notes (Signed)
Inpatient Diabetes Program Recommendations  AACE/ADA: New Consensus Statement on Inpatient Glycemic Control (2013)  Target Ranges:  Prepandial:   less than 140 mg/dL      Peak postprandial:   less than 180 mg/dL (1-2 hours)      Critically ill patients:  140 - 180 mg/dL   Reason for Visit: Glycemic control   Results for ANGELITO, VERDEJO (MRN [id number]) as of 10/02/2013 14:50  Ref. Range 10/01/2013 11:26 10/01/2013 16:26 10/01/2013 20:52 10/02/2013 07:25 10/02/2013 11:37  Glucose-Capillary Latest Range: 70-99 mg/dL 142 (H) 141 (H) 128 (H) 181 (H) 116 (H)   Blood sugars much improved. Will need to be discharged on affordable insulin. Recommend Humulin 70/30 26 units bid. (Cost is $24.88 at [facility]) Will need f/u with PCP to manage DM. Will also need prescription for glucose meter and strips.  Pt has given insulin injections in the past.  Recommend RN to allow pt to give his own insulin until discharge.  Will continue to follow. Thank you. Lorenda Peck, RD, LDN, CDE Inpatient Diabetes Coordinator [phone]

## 2013-10-02 NOTE — Anesthesia Postprocedure Evaluation (Signed)
  Anesthesia Post-op Note  Patient: Mark [name]  Procedure(s) Performed: Procedure(s) (LRB): Repeat irrigation and debridement left foot, left 3rd toe amputation (Left) IRRIGATION AND DEBRIDEMENT EXTREMITY (Left)  Patient Location: PACU  Anesthesia Type: General  Level of Consciousness: awake and alert   Airway and Oxygen Therapy: Patient Spontanous Breathing  Post-op Pain: mild  Post-op Assessment: Post-op Vital signs reviewed, Patient's Cardiovascular Status Stable, Respiratory Function Stable, Patent Airway and No signs of Nausea or vomiting  Last Vitals:  Filed Vitals:   10/02/13 2145  BP: 141/68  Pulse: 81  Temp: 36.9 C  Resp: 17    Post-op Vital Signs: stable   Complications: No apparent anesthesia complications

## 2013-10-02 NOTE — Progress Notes (Signed)
Instructed patient's visitors regarding protective equipment to wear while visiting.  Both visitors declined to wear gown and gloves stating they did not need to wear

## 2013-10-02 NOTE — Brief Op Note (Signed)
09/26/2013 - 10/02/2013  9:03 PM  PATIENT:  Mark [name]  63 y.o. male  PRE-OPERATIVE DIAGNOSIS:  Infection/abscess left foot  POST-OPERATIVE DIAGNOSIS:  Infection/abscess left foot  PROCEDURE:  Procedure(s): Repeat irrigation and debridement left foot, left 3rd toe amputation (Left) IRRIGATION AND DEBRIDEMENT EXTREMITY (Left)  SURGEON:  Surgeon(s) and Role:    * Mcarthur Rossetti, MD - Primary  PHYSICIAN ASSISTANT: Benita Stabile, PA-C  ANESTHESIA:   general  EBL:   50 cc  BLOOD ADMINISTERED:none  DRAINS: none   LOCAL MEDICATIONS USED:  NONE  SPECIMEN:  No Specimen  DISPOSITION OF SPECIMEN:  N/A  COUNTS:  YES  TOURNIQUET:  * No tourniquets in log *  DICTATION: .Other Dictation: Dictation Number [id number]  PLAN OF CARE: Admit to inpatient   PATIENT DISPOSITION:  PACU - hemodynamically stable.   Delay start of Pharmacological VTE agent (>24hrs) due to surgical blood loss or risk of bleeding: no

## 2013-10-02 NOTE — Transfer of Care (Signed)
Immediate Anesthesia Transfer of Care Note  Patient: Mark [name]  Procedure(s) Performed: Procedure(s) (LRB): Repeat irrigation and debridement left foot, left 3rd toe amputation (Left) IRRIGATION AND DEBRIDEMENT EXTREMITY (Left)  Patient Location: PACU  Anesthesia Type: General  Level of Consciousness: sedated, patient cooperative and responds to stimulation  Airway & Oxygen Therapy: Patient Spontanous Breathing and Patient connected to face mask oxgen  Post-op Assessment: Report given to PACU RN and Post -op Vital signs reviewed and stable  Post vital signs: Reviewed and stable  Complications: No apparent anesthesia complications

## 2013-10-02 NOTE — Progress Notes (Signed)
[facility] PROGRESS NOTE  Mark [name] [id number] DOB: [birth year] DOA: 09/26/2013 PCP: No PCP Per Patient   Brief narrative  63 y.o. male with a past medical history of poorly controlled type 2 diabetes mellitus, medication nonadherence, presents as a direct admit from the urgent care Center. He complained of a two-week history of left foot pain, swelling, erythema which has significantly worsened in the last 2-3 days. He reports associated subjective fevers, chills, malaise, and feeling quite ill. He was found to be febrile at the urgent care center having a temperature 102.7 with lab work showed a white count of 26.8 and a glucose of 359. Patient admitted for sepsis due to cellulitis and abscess of left foot.   Assessment/Plan:  Sepsis due to Cellulitis of foot with abscess and concern for necrotizing fascitis  -Patient presented with marked leukocytosis and fever  - Xray of foot showed foreign body between 2nd and third toe and large amount of gas tracking along the foot concerning for necrotizing fascitis. An MRI of the foot was done which again showed cellulitis with multiple small abscesses and concern for necrotizing fascitis.  orthopedics consulted and patient taken to OR on 1/29 for I&D. As per ortho he had gross purulence throughout the left foot along plantar aspect and some dorsally.  -hydrotherapy started on 1/30. Blood and abscess culture negative.  -Further I&D and amputation of necrotic left third toe  planned for today. Hopefully will be able to salvage his foot. -pain control   Uncontrolled DM  A1C of 13.7 . reports not on any meds due to inability to afford. CM consulted. Will need insulin upon d/c.  FSG better after increasing the dose of lantus and aspart further.  [facility] Will discharge on BID novolog.  -Emphasized on medication compliance.   Hypertension  added lisinopril and  prn hydralazine   Tobacco abuse   counseled on cessation   Hypokalemia  Replenished   Malnutrition  Added supplements  Code Status: full   Family Communication: Called  wife and updated  Disposition Plan: skilled  nursing facility if no further surgery required  Consultants:  Ortho    Procedures:  None    Antibiotics:  IV vanco and zosyn / cefepime ( 1/28>>) , completes 7 day of vanco and cefepime today. Will need to narrow it to clindamycin tomorrow following I&D later today. Will need at least 2 weeks of antibiotics if no further  surgery planned .  HPI/Subjective:  Patient seen and examined this morning. No overnight issues    Objective: Filed Vitals:   10/01/13 2057  BP: 132/68  Pulse: 84  Temp: 99.5 F (37.5 C)  Resp: 20    Intake/Output Summary (Last 24 hours) at 10/02/13 1246 Last data filed at 10/02/13 0720  Gross per 24 hour  Intake    480 ml  Output   1300 ml  Net   -820 ml   Filed Weights   09/26/13 2359  Weight: 93.441 kg (206 lb)    Exam:  General: Elderly male lying in bed in no acute distress  HEENT: no pallor, moist oral mucosa  Chest: clear b/l, no added sounds  Abd: soft, NT, ND, BS+  Ext: Warm, dressing over left foot , trace edema of left leg. CNS: AAOX3   Data Reviewed: Basic Metabolic Panel:  Recent Labs Lab 09/26/13 2315 09/27/13 0508 09/28/13 0454 09/29/13 0540 10/02/13 0929  NA 134* 134* 139 134*  --  K 3.4* 3.3* 3.1* 3.6*  --   CL 92* 93* 100 99  --   CO2 25 26 26 26   --   GLUCOSE 336* 310* 167* 257*  --   BUN 14 15 13 15   --   CREATININE 0.81 0.94 0.84 0.88 0.76  CALCIUM 8.5 8.5 8.1* 8.0*  --    Liver Function Tests:  Recent Labs Lab 09/27/13 0508  AST 22  ALT 26  ALKPHOS 182*  BILITOT 0.5  PROT 6.6  ALBUMIN 2.1*   No results found for this basename: LIPASE, AMYLASE,  in the last 168 hours No results found for this basename: AMMONIA,  in the last 168 hours CBC:  Recent Labs Lab 09/27/13 0508 09/28/13 0454 09/29/13 0540  09/30/13 0516 10/01/13 0505 10/02/13 0545  WBC 37.3* 32.7* 21.1* 17.1* 16.8* 16.7*  NEUTROABS  --  28.2* 16.3* 12.0* 12.6*  --   HGB 11.1* 10.2* 10.2* 10.3* 10.1* 10.6*  HCT 33.0* 30.7* 31.1* 30.8* 30.7* 31.6*  MCV 91.4 91.4 92.6 91.7 91.4 91.6  PLT 471* 522* 545* 644* 653* 718*   Cardiac Enzymes: No results found for this basename: CKTOTAL, CKMB, CKMBINDEX, TROPONINI,  in the last 168 hours BNP (last 3 results) No results found for this basename: PROBNP,  in the last 8760 hours CBG:  Recent Labs Lab 10/01/13 1126 10/01/13 1626 10/01/13 2052 10/02/13 0725 10/02/13 1137  GLUCAP 142* 141* 128* 181* 116*    Recent Results (from the past 240 hour(s))  CULTURE, BLOOD (ROUTINE X 2)     Status: None   Collection Time    09/26/13 11:15 PM      Result Value Range Status   Specimen Description BLOOD RIGHT ANTECUBITAL   Final   Special Requests BOTTLES DRAWN AEROBIC AND ANAEROBIC 5CC   Final   Culture  Setup Time     Final   Value: 09/27/2013 03:35     Performed at [facility]   Culture     Final   Value:        BLOOD CULTURE RECEIVED NO GROWTH TO DATE CULTURE WILL BE HELD FOR 5 DAYS BEFORE ISSUING A FINAL NEGATIVE REPORT     Performed at [facility]   Report Status PENDING   Incomplete  CULTURE, BLOOD (ROUTINE X 2)     Status: None   Collection Time    09/26/13 11:20 PM      Result Value Range Status   Specimen Description BLOOD RIGHT HAND   Final   Special Requests BOTTLES DRAWN AEROBIC AND ANAEROBIC 5CC   Final   Culture  Setup Time     Final   Value: 09/27/2013 03:34     Performed at [facility]   Culture     Final   Value:        BLOOD CULTURE RECEIVED NO GROWTH TO DATE CULTURE WILL BE HELD FOR 5 DAYS BEFORE ISSUING A FINAL NEGATIVE REPORT     Performed at [facility]   Report Status PENDING   Incomplete  SURGICAL PCR SCREEN     Status: Abnormal   Collection Time    09/27/13  4:17 PM      Result Value Range Status   MRSA, PCR  NEGATIVE  NEGATIVE Final   Staphylococcus aureus POSITIVE (*) NEGATIVE Final   Comment:            The Xpert SA Assay (FDA     approved for NASAL specimens  in patients over 38 years of age),     is one component of     a comprehensive surveillance     program.  Test performance has     been validated by [employer] for patients greater     than or equal to 102 year old.     It is not intended     to diagnose infection nor to     guide or monitor treatment.  CULTURE, ROUTINE-ABSCESS     Status: None   Collection Time    09/27/13  6:05 PM      Result Value Range Status   Specimen Description ABSCESS LEFT FOOT   Final   Special Requests NONE   Final   Gram Stain     Final   Value: NO WBC SEEN     NO ORGANISMS SEEN     Performed by [facility] V RN AT 2031 ON 09/27/13 BY HAMER N     Performed at [facility]   Culture     Final   Value: MODERATE STAPHYLOCOCCUS SPECIES (COAGULASE NEGATIVE)     Performed at [facility]   Report Status 09/30/2013 FINAL   Final  ANAEROBIC CULTURE     Status: None   Collection Time    09/27/13  6:05 PM      Result Value Range Status   Specimen Description ABSCESS LEFT FOOT   Final   Special Requests NONE   Final   Gram Stain     Final   Value: NO WBC SEEN     NO ORGANISMS SEEN     Performed by [facility] V RN 2031 ON 09/27/13 BY HAMER N     Performed at [facility]   Culture     Final   Value: NO ANAEROBES ISOLATED; CULTURE IN PROGRESS FOR 5 DAYS     Performed at [facility]   Report Status PENDING   Incomplete  GRAM STAIN     Status: None   Collection Time    09/27/13  6:05 PM      Result Value Range Status   Specimen Description ABSCESS   Final   Special Requests LEFT FOOT   Final   Gram Stain     Final   Value: NO WBC SEEN     NO ORGANISMS SEEN     Gram Stain Report Called to,Read Back By and Verified With: [facility] RN 2031 [id number] HAMER,N   Report  Status 09/27/2013 FINAL   Final  CLOSTRIDIUM DIFFICILE BY PCR     Status: None   Collection Time    10/02/13  8:43 AM      Result Value Range Status   C difficile by pcr NEGATIVE  NEGATIVE Final   Comment: Performed at [facility]     Studies: No results found.  Scheduled Meds: . ceFEPime (MAXIPIME) IV  2 g Intravenous Q8H  . ferrous gluconate  324 mg Oral BID WC  . [START ON 10/03/2013] influenza vac split quadrivalent PF  0.5 mL Intramuscular Tomorrow-1000  . insulin aspart  0-15 Units Subcutaneous TID WC  . insulin aspart  0-5 Units Subcutaneous QHS  . insulin aspart  9 Units Subcutaneous TID WC  . insulin glargine  36 Units Subcutaneous QHS  . lisinopril  20 mg Oral Daily  . vancomycin  1,500 mg Intravenous Q12H   Continuous Infusions:  Time spent: 25 minutes    Louellen Molder  [facility] Pager [phone] If 7PM-7AM, please contact night-coverage at [email or web address], password [email or web address] 10/02/2013, 12:46 PM  LOS: 6 days

## 2013-10-02 NOTE — Preoperative (Signed)
Beta Blockers   Reason not to administer Beta Blockers:Not Applicable 

## 2013-10-02 NOTE — Progress Notes (Signed)
CSW to follow for possible snf placement following patients surgery.  Mark [name] C. [place] MSW, Mark [name]

## 2013-10-02 NOTE — Progress Notes (Signed)
Patient ID: Mark [name], male   DOB: [birth year], 63 y.o.   MRN: [id number] We plan on proceeding to the OR this evening for a repeat I&D of his left foot.  His left foot 3rd toe is necrotic and will need to be removed.  Hopefully, a good assessment can be made at that time as to the viability and potential for salvage of his foot.  He understands this fully.

## 2013-10-02 NOTE — Progress Notes (Signed)
Pt aaox3.  Pt has no complaints at this time.  Pt calm and cooperative.  No change in [atient assessment.
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
## 2013-10-03 ENCOUNTER — Encounter (HOSPITAL_COMMUNITY): Payer: Self-pay | Admitting: Orthopaedic Surgery

## 2013-10-03 DIAGNOSIS — D649 Anemia, unspecified: Secondary | ICD-10-CM

## 2013-10-03 LAB — CBC
HCT: 33.2 % — ABNORMAL LOW (ref 39.0–52.0)
Hemoglobin: 11 g/dL — ABNORMAL LOW (ref 13.0–17.0)
MCH: 30.7 pg (ref 26.0–34.0)
MCHC: 33.1 g/dL (ref 30.0–36.0)
MCV: 92.7 fL (ref 78.0–100.0)
Platelets: 742 10*3/uL — ABNORMAL HIGH (ref 150–400)
RBC: 3.58 MIL/uL — ABNORMAL LOW (ref 4.22–5.81)
RDW: 13.3 % (ref 11.5–15.5)
WBC: 20.1 10*3/uL — ABNORMAL HIGH (ref 4.0–10.5)

## 2013-10-03 LAB — CULTURE, BLOOD (ROUTINE X 2)
Culture: NO GROWTH
Culture: NO GROWTH

## 2013-10-03 LAB — GLUCOSE, CAPILLARY
Glucose-Capillary: 138 mg/dL — ABNORMAL HIGH (ref 70–99)
Glucose-Capillary: 108 mg/dL — ABNORMAL HIGH (ref 70–99)
Glucose-Capillary: 92 mg/dL (ref 70–99)
Glucose-Capillary: 96 mg/dL (ref 70–99)
Glucose-Capillary: 152 mg/dL — ABNORMAL HIGH (ref 70–99)
Glucose-Capillary: 159 mg/dL — ABNORMAL HIGH (ref 70–99)

## 2013-10-03 MED ORDER — PIPERACILLIN-TAZOBACTAM 3.375 G IVPB
3.3750 g | Freq: Three times a day (TID) | INTRAVENOUS | Status: DC
  Administered 2013-10-03 – 2013-10-09 (×19): 3.375 g via INTRAVENOUS
  Filled 2013-10-03 (×20): qty 50

## 2013-10-03 MED ORDER — POTASSIUM CHLORIDE CRYS ER 20 MEQ PO TBCR
40.0000 meq | EXTENDED_RELEASE_TABLET | Freq: Once | ORAL | Status: AC
  Administered 2013-10-03: 40 meq via ORAL
  Filled 2013-10-03: qty 2

## 2013-10-03 MED ORDER — ENOXAPARIN SODIUM 40 MG/0.4ML ~~LOC~~ SOLN
40.0000 mg | SUBCUTANEOUS | Status: DC
  Administered 2013-10-03 – 2013-10-08 (×6): 40 mg via SUBCUTANEOUS
  Filled 2013-10-03 (×7): qty 0.4

## 2013-10-03 MED ORDER — INSULIN GLARGINE 100 UNIT/ML ~~LOC~~ SOLN
40.0000 [IU] | Freq: Every day | SUBCUTANEOUS | Status: DC
  Administered 2013-10-03 – 2013-10-06 (×4): 40 [IU] via SUBCUTANEOUS
  Filled 2013-10-03 (×5): qty 0.4

## 2013-10-03 NOTE — Op Note (Signed)
NAMESHARIF, WOLVERTON NO.:  [id number]  MEDICAL RECORD NO.:  [id number]  LOCATION:  [place]                         FACILITY:  [facility]  PHYSICIAN:  Lind Guest. Ninfa Linden, M.D.DATE OF BIRTH:  Aug 12, 1951  DATE OF PROCEDURE:  10/02/2013 DATE OF DISCHARGE:                              OPERATIVE REPORT   PREOPERATIVE DIAGNOSIS:  Left foot infection with necrotizing fasciitis and necrotic third toe status post irrigation and debridement x1, status post multiple days of hydrotherapy.  POSTOPERATIVE DIAGNOSIS:  Left foot infection with necrotizing fasciitis and necrotic third toe status post irrigation and debridement x1, status post multiple days of hydrotherapy.  PROCEDURE: 1. Repeat irrigation and debridement of left foot including removal of     necrotic skin, soft tissue, fascia, and muscle. 2. 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  SURGEON:  Lind Guest. Ninfa Linden, M.D.  ASSISTANT:  Erskine Emery, P.A.  ANESTHESIA:  General.  BLOOD LOSS:  Less than 50 mL.  COMPLICATIONS:  None.  INDICATIONS:  [name] is a 63 year old gentleman with overwhelming infection involving his left foot and necrotizing fasciitis.  He is a diabetic who has had poor control.  We took him to the operating room last Thursday evening for extensive irrigation and debridement of multiple sites of his foot.  He has then had hydrotherapy every day since then.  His white cell count peripherally has been finally coming down.  He is presenting for his second irrigation and debridement with removal of the second due to the severe necrosis of that second toe.  We are still attempting a limb salvage to salvage this foot, and he wishes for that to and would definitely not consent to an amputation right now.  PROCEDURE DESCRIPTION:  After informed consent was obtained, appropriate left foot was marked.  He was brought to the operating room, placed supine on the  operative table, general anesthesia was then obtained. His left shin, foot, and ankle were prepped and draped with Betadine paint.  Time-out was called and he was identified as correct patient, correct left foot.  We then assessed the wounds and still found areas of necrosis.  I was able to use a knife and remove sharply the third toe and then used an oscillating saw to amputate the third ray through the mid metatarsal.  We then used a #10 blade to remove necrotic skin, fascia, and muscle throughout the plantar aspect and medial aspect in the arch of his foot.  We then used 3 L of normal saline solution to lavage to the foot wounds.  Next, we were able to actually close the dorsal wounds on his foot and the middle plantar wound, the medial wound still remains open, and an area of about 5 cm x 7 cm.  We were then able to clean this foot and place a Xeroform over this and well-padded sterile dressing.  He was awakened, extubated, and taken to the recovery room in stable condition.  All final counts were correct.  There were no complications noted.  Postoperatively, we will resume hydrotherapy in about 48 hours to his left foot with continued attempts to salvage with hopefully being able to place a  skin graft on his foot by the end of the week.     Lind Guest. Ninfa Linden, M.D.     CYB/MEDQ  D:  10/02/2013  T:  10/03/2013  Job:  [id number]

## 2013-10-03 NOTE — Progress Notes (Addendum)
Patient ID: Mark [name], male   DOB: 1950-09-14, 63 y.o.   MRN: [id number]  [facility] PROGRESS NOTE  Mark [name] [id number] DOB: 11-Jan-1951 DOA: 09/26/2013 PCP: No PCP Per Patient  Brief narrative: 63 y.o. male with a past medical history of poorly controlled type 2 diabetes mellitus, medication nonadherence, presents as a direct admit from the urgent care Center. He complained of a two-week history of left foot pain, swelling, erythema which has significantly worsened in the last 2-3 days prior to this admission. He reported associated subjective fevers, chills, malaise. He was found to be febrile at the urgent care center having a temperature 102.7 with lab work showed a white count of 26.8 and a glucose of 359. Patient admitted for sepsis due to cellulitis and abscess of left foot.   Assessment/Plan:  Sepsis due to Cellulitis of foot with abscess and concern for necrotizing fascitis  - Patient presented with marked leukocytosis and fever  - Xray of foot concerning for necrotizing fascitis, an MRI c/w cellulitis and small abscesses, concern for necrotizing fascitis.  - ortho following, pt is status post I&D (done 01/29), hydrotherapy started on 1/30. Blood and abscess culture negative.  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 reports not on any meds due to inability to afford. CM consulted. Will need insulin upon d/c.  - FSG better after increasing the dose of lantus and aspart further.  - [facility] Will discharge on BID novolog.  - Emphasize on medication  compliance.  Leukocytosis - secondary to cellulitis and now post op demargination and stress reaction - continue ABX as noted below and repeat CBC in AM Anemia of chronic disease - no signs of active bleeding - CBC in AM Hypertension  - added lisinopril and prn hydralazine  Tobacco abuse  - counseled on cessation  Hypokalemia  - still low, will continue to supplement and repeat BMP in AM Malnutrition  - Added supplements   Code Status: full  Family Communication: Pt at bedside  Disposition Plan: skilled nursing facility when medically ready   Consultants:  Ortho  Procedures:   02/03 --> Repeat irrigation and debridement of left foot including removal of necrotic skin, soft tissue, fascia, and muscle. 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 SURGEON: Dr. Lind Guest. Blackman Antibiotics:  IV vanco and zosyn / cefepime ( 1/28>>), completed 7 day of vanco and cefepime   Change Maxipime to Zosyn 02/04 -->   HPI/Subjective: No events overnight.   Objective: Filed Vitals:   10/02/13 2145 10/02/13 2200 10/03/13 0552 10/03/13 0917  BP: 141/68 148/80 158/68   Pulse: 81 83 84   Temp: 98.4 F (36.9 C) 98.3 F (36.8 C) 99.8 F (37.7 C)   TempSrc:   Oral   Resp: 17 18 18    Height:      Weight:      SpO2: 95% 94% 97% 95%    Intake/Output Summary (Last 24 hours) at 10/03/13 1233 Last data filed at 10/03/13 0841  Gross per 24 hour  Intake  960 ml  Output   2550 ml  Net  -1590 ml    Exam:   General:  Pt is alert, follows commands appropriately, not in acute distress  Cardiovascular: Regular rate and rhythm, S1/S2, no murmurs, no rubs, no gallops  Respiratory: Clear to auscultation bilaterally, no wheezing, no crackles, no rhonchi  Abdomen: Soft, non tender, non distended, bowel sounds present, no guarding  Neuro: Grossly nonfocal  Data Reviewed: Basic Metabolic Panel:  Recent Labs Lab 09/26/13 2315 09/27/13 0508 09/28/13 0454  09/29/13 0540 10/02/13 0929  NA 134* 134* 139 134*  --   K 3.4* 3.3* 3.1* 3.6*  --   CL 92* 93* 100 99  --   CO2 25 26 26 26   --   GLUCOSE 336* 310* 167* 257*  --   BUN 14 15 13 15   --   CREATININE 0.81 0.94 0.84 0.88 0.76  CALCIUM 8.5 8.5 8.1* 8.0*  --    Liver Function Tests:  Recent Labs Lab 09/27/13 0508  AST 22  ALT 26  ALKPHOS 182*  BILITOT 0.5  PROT 6.6  ALBUMIN 2.1*   CBC:  Recent Labs Lab 09/27/13 0508 09/28/13 0454 09/29/13 0540 09/30/13 0516 10/01/13 0505 10/02/13 0545 10/03/13 0520  WBC 37.3* 32.7* 21.1* 17.1* 16.8* 16.7* 20.1*  NEUTROABS  --  28.2* 16.3* 12.0* 12.6*  --   --   HGB 11.1* 10.2* 10.2* 10.3* 10.1* 10.6* 11.0*  HCT 33.0* 30.7* 31.1* 30.8* 30.7* 31.6* 33.2*  MCV 91.4 91.4 92.6 91.7 91.4 91.6 92.7  PLT 471* 522* 545* 644* 653* 718* 742*   CBG:  Recent Labs Lab 10/02/13 1639 10/02/13 2110 10/02/13 2229 10/03/13 0733 10/03/13 1125  GLUCAP 122* 125* 124* 138* 108*    Recent Results (from the past 240 hour(s))  CULTURE, BLOOD (ROUTINE X 2)     Status: None   Collection Time    09/26/13 11:15 PM      Result Value Range Status   Specimen Description BLOOD RIGHT ANTECUBITAL   Final   Special Requests BOTTLES DRAWN AEROBIC AND ANAEROBIC 5CC   Final   Culture  Setup Time     Final   Value: 09/27/2013 03:35     Performed at [place]     Final   Value: NO GROWTH 5 DAYS     Performed at [facility]   Report Status 10/03/2013 FINAL   Final  CULTURE, BLOOD (ROUTINE X 2)     Status: None   Collection Time    09/26/13 11:20 PM      Result Value Range Status   Specimen Description BLOOD RIGHT HAND   Final   Special Requests BOTTLES DRAWN AEROBIC AND ANAEROBIC 5CC   Final   Culture  Setup Time     Final   Value: 09/27/2013 03:34     Performed at [place]     Final   Value: NO GROWTH 5 DAYS     Performed at [facility]   Report Status 10/03/2013 FINAL   Final  SURGICAL  PCR SCREEN     Status: Abnormal   Collection Time    09/27/13  4:17 PM      Result Value Range Status   MRSA, PCR NEGATIVE  NEGATIVE Final   Staphylococcus aureus POSITIVE (*) NEGATIVE Final   Comment:            The Xpert SA Assay (FDA  approved for NASAL specimens     in patients over 54 years of age),     is one component of     a comprehensive surveillance     program.  Test performance has     been validated by [employer] for patients greater     than or equal to 70 year old.     It is not intended     to diagnose infection nor to     guide or monitor treatment.  CULTURE, ROUTINE-ABSCESS     Status: None   Collection Time    09/27/13  6:05 PM      Result Value Range Status   Specimen Description ABSCESS LEFT FOOT   Final   Special Requests NONE   Final   Gram Stain     Final   Value: NO WBC SEEN     NO ORGANISMS SEEN     Performed by [facility] V RN AT 2031 ON 09/27/13 BY HAMER N     Performed at [facility]   Culture     Final   Value: MODERATE STAPHYLOCOCCUS SPECIES (COAGULASE NEGATIVE)     Performed at [facility]   Report Status 09/30/2013 FINAL   Final  ANAEROBIC CULTURE     Status: None   Collection Time    09/27/13  6:05 PM      Result Value Range Status   Specimen Description ABSCESS LEFT FOOT   Final   Special Requests NONE   Final   Gram Stain     Final   Value: NO WBC SEEN     NO ORGANISMS SEEN     Performed by [facility] V RN 2031 ON 09/27/13 BY HAMER N     Performed at [facility]   Culture     Final   Value: NO ANAEROBES ISOLATED     Performed at [facility]   Report Status 10/02/2013 FINAL   Final  GRAM STAIN     Status: None   Collection Time    09/27/13  6:05 PM      Result Value Range Status   Specimen Description ABSCESS   Final   Special Requests LEFT FOOT   Final   Gram Stain     Final   Value: NO WBC SEEN     NO ORGANISMS SEEN     Gram Stain  Report Called to,Read Back By and Verified With: [facility] RN 2031 [id number] HAMER,N   Report Status 09/27/2013 FINAL   Final  CLOSTRIDIUM DIFFICILE BY PCR     Status: None   Collection Time    10/02/13  8:43 AM      Result Value Range Status   C difficile by pcr NEGATIVE  NEGATIVE Final   Comment: Performed at [facility]     Scheduled Meds: . ferrous gluconate  324 mg Oral BID WC  . influenza vac split quadrivalent PF  0.5 mL Intramuscular Tomorrow-1000  . insulin aspart  0-15 Units Subcutaneous TID WC  . insulin aspart  0-5 Units Subcutaneous QHS  . insulin aspart  9 Units Subcutaneous TID WC  . insulin glargine  36 Units Subcutaneous QHS  . lisinopril  20 mg Oral Daily  . piperacillin-tazobactam (ZOSYN)  IV  3.375 g Intravenous Q8H  . vancomycin  1,500 mg Intravenous Q12H   Continuous Infusions:    Faye Ramsay, MD  [place] Pager [phone]  If 7PM-7AM, please contact night-coverage [email or web address] Password [email or web address] 10/03/2013, 12:33 PM   LOS: 7 days

## 2013-10-03 NOTE — Progress Notes (Signed)
Patient ID: Mark [name], male   DOB: February 06, 1951, 63 y.o.   MRN: [id number] Was able to remove his necrotic 3rd toe last evening and repeat and extensive I&D on his left foot.  Will leave dressing on his foot today, then have hydrotherapy at the bedside tomorrow 2/5.  After that, I plan on taking him back to the OR this Friday for another I&D in a further attempt to salvage his foot.  Will possibly skin graft the medial foot wound at that point.

## 2013-10-04 LAB — GLUCOSE, CAPILLARY
Glucose-Capillary: 122 mg/dL — ABNORMAL HIGH (ref 70–99)
Glucose-Capillary: 99 mg/dL (ref 70–99)
Glucose-Capillary: 123 mg/dL — ABNORMAL HIGH (ref 70–99)
Glucose-Capillary: 151 mg/dL — ABNORMAL HIGH (ref 70–99)
Glucose-Capillary: 119 mg/dL — ABNORMAL HIGH (ref 70–99)

## 2013-10-04 LAB — BASIC METABOLIC PANEL
BUN: 12 mg/dL (ref 6–23)
CO2: 29 mEq/L (ref 19–32)
Calcium: 8.2 mg/dL — ABNORMAL LOW (ref 8.4–10.5)
Chloride: 100 mEq/L (ref 96–112)
Creatinine, Ser: 0.87 mg/dL (ref 0.50–1.35)
GFR calc Af Amer: >90 mL/min (ref >90)
GFR calc non Af Amer: >90 mL/min (ref >90)
Glucose, Bld: 166 mg/dL — ABNORMAL HIGH (ref 70–99)
Potassium: 4.1 mEq/L (ref 3.7–5.3)
Sodium: 137 mEq/L (ref 137–147)

## 2013-10-04 LAB — CBC
HCT: 28.8 % — ABNORMAL LOW (ref 39.0–52.0)
Hemoglobin: 9.5 g/dL — ABNORMAL LOW (ref 13.0–17.0)
MCH: 30.5 pg (ref 26.0–34.0)
MCHC: 33 g/dL (ref 30.0–36.0)
MCV: 92.6 fL (ref 78.0–100.0)
Platelets: 789 10*3/uL — ABNORMAL HIGH (ref 150–400)
RBC: 3.11 MIL/uL — ABNORMAL LOW (ref 4.22–5.81)
RDW: 13.6 % (ref 11.5–15.5)
WBC: 16.1 10*3/uL — ABNORMAL HIGH (ref 4.0–10.5)

## 2013-10-04 MED ORDER — CLINDAMYCIN HCL 300 MG PO CAPS
300.0000 mg | ORAL_CAPSULE | Freq: Four times a day (QID) | ORAL | Status: DC
  Administered 2013-10-04 – 2013-10-09 (×19): 300 mg via ORAL
  Filled 2013-10-04 (×23): qty 1

## 2013-10-04 NOTE — Progress Notes (Signed)
Patient ID: Mark [name], male   DOB: Dec 15, 1950, 63 y.o.   MRN: [id number]  [facility] PROGRESS NOTE  Mark [name] [id number] DOB: 05-18-1951 DOA: 09/26/2013 PCP: No PCP Per Patient  Brief narrative:  63 y.o. male with a past medical history of poorly controlled type 2 diabetes mellitus, medication nonadherence, presents as a direct admit from the urgent care Center. He complained of a two-week history of left foot pain, swelling, erythema which has significantly worsened in the last 2-3 days prior to this admission. He reported associated subjective fevers, chills, malaise. He was found to be febrile at the urgent care center having a temperature 102.7 with lab work showed a white count of 26.8 and a glucose of 359. Patient admitted for sepsis due to cellulitis and abscess of left foot.   Assessment/Plan:  Sepsis due to Cellulitis of foot with abscess and concern for necrotizing fascitis  - Patient presented with marked leukocytosis and fever  - Xray of foot concerning for necrotizing fascitis, an MRI c/w cellulitis and small abscesses, concern for necrotizing fascitis.  - ortho following, pt is status post I&D (done 01/29), hydrotherapy started on 1/30. 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 reports not on any meds due to inability to afford. CM consulted. Will need insulin upon d/c.  - FSG 90 - 110, reasonable control  - [facility] - Emphasize on medication compliance.  Leukocytosis  - secondary to cellulitis and  now post op demargination and stress reaction  - continue ABX as noted below and repeat CBC in AM  Anemia of chronic disease  - drop in Hg over the past 24 hours, no signs of active bleeding  - CBC in AM  Hypertension  - continue lisinopril and prn hydralazine  - may need scheduled hydralazine if SBP persistently > 150  Tobacco abuse  - counseled on cessation  Hypokalemia  - continue to supplement and repeat BMP in AM  Malnutrition  - Added supplements   Code Status: full  Family Communication: Pt at bedside  Disposition Plan: skilled nursing facility when medically ready   Consultants:  Ortho  Procedures:  02/03 --> Repeat irrigation and debridement of left foot including removal of necrotic skin, soft tissue, fascia, and muscle. 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 SURGEON: Dr. Lind Guest. Blackman Antibiotics:  IV vanco and zosyn / cefepime ( 1/28>>), completed 7 day of vanco and cefepime  Change Maxipime to Zosyn 02/04 -->  Clindamycin 02/05 -->  HPI/Subjective: No events overnight.   Objective: Filed Vitals:   10/03/13 1340 10/03/13 2130 10/04/13 0518 10/04/13 1357  BP: 129/63 112/61 121/70 169/77  Pulse: 80 81 68 82  Temp: 100.2 F (37.9 C) 99.1 F (37.3 C) 98.9 F (37.2 C) 98.2 F (36.8 C)  TempSrc: Oral Oral Oral Oral  Resp: 16 18 18 18   Height:      Weight:      SpO2: 93% 93% 97% 94%    Intake/Output Summary (Last 24  hours) at 10/04/13 1446 Last data filed at 10/04/13 1358  Gross per 24 hour  Intake    650 ml  Output   1251 ml  Net   -601 ml    Exam:   General:  Pt is alert, follows commands appropriately, not in acute distress  Cardiovascular: Regular rate and rhythm, S1/S2, no murmurs, no rubs, no gallops  Respiratory: Clear to auscultation bilaterally, no wheezing, no crackles, no rhonchi  Abdomen: Soft, non tender, non distended, bowel sounds present, no guarding  Neuro: Grossly nonfocal  Data  Reviewed: Basic Metabolic Panel:  Recent Labs Lab 09/28/13 0454 09/29/13 0540 10/02/13 0929 10/04/13 0510  NA 139 134*  --  137  K 3.1* 3.6*  --  4.1  CL 100 99  --  100  CO2 26 26  --  29  GLUCOSE 167* 257*  --  166*  BUN 13 15  --  12  CREATININE 0.84 0.88 0.76 0.87  CALCIUM 8.1* 8.0*  --  8.2*   CBC:  Recent Labs Lab 09/28/13 0454 09/29/13 0540 09/30/13 0516 10/01/13 0505 10/02/13 0545 10/03/13 0520 10/04/13 0510  WBC 32.7* 21.1* 17.1* 16.8* 16.7* 20.1* 16.1*  NEUTROABS 28.2* 16.3* 12.0* 12.6*  --   --   --   HGB 10.2* 10.2* 10.3* 10.1* 10.6* 11.0* 9.5*  HCT 30.7* 31.1* 30.8* 30.7* 31.6* 33.2* 28.8*  MCV 91.4 92.6 91.7 91.4 91.6 92.7 92.6  PLT 522* 545* 644* 653* 718* 742* 789*   CBG:  Recent Labs Lab 10/03/13 1637 10/03/13 1854 10/03/13 2119 10/04/13 0737 10/04/13 1111  GLUCAP 96 152* 159* 122* 99    Recent Results (from the past 240 hour(s))  CULTURE, BLOOD (ROUTINE X 2)     Status: None   Collection Time    09/26/13 11:15 PM      Result Value Range Status   Specimen Description BLOOD RIGHT ANTECUBITAL   Final   Special Requests BOTTLES DRAWN AEROBIC AND ANAEROBIC 5CC   Final   Culture  Setup Time     Final   Value: 09/27/2013 03:35     Performed at [place]     Final   Value: NO GROWTH 5 DAYS     Performed at [facility]   Report Status 10/03/2013 FINAL   Final  CULTURE, BLOOD (ROUTINE X 2)     Status: None   Collection Time    09/26/13 11:20 PM      Result Value Range Status   Specimen Description BLOOD RIGHT HAND   Final   Special Requests BOTTLES DRAWN AEROBIC AND ANAEROBIC 5CC   Final   Culture  Setup Time     Final   Value: 09/27/2013 03:34     Performed at [facility]   Culture     Final   Value: NO GROWTH 5 DAYS     Performed at [facility]   Report Status 10/03/2013 FINAL   Final  SURGICAL PCR SCREEN     Status: Abnormal   Collection Time    09/27/13  4:17 PM      Result Value  Range Status   MRSA, PCR NEGATIVE  NEGATIVE Final   Staphylococcus aureus POSITIVE (*) NEGATIVE Final   Comment:            The Xpert SA Assay (FDA     approved for NASAL specimens     in patients over 69 years of age),  is one component of     a comprehensive surveillance     program.  Test performance has     been validated by [facility] for patients greater     than or equal to 17 year old.     It is not intended     to diagnose infection nor to     guide or monitor treatment.  CULTURE, ROUTINE-ABSCESS     Status: None   Collection Time    09/27/13  6:05 PM      Result Value Range Status   Specimen Description ABSCESS LEFT FOOT   Final   Special Requests NONE   Final   Gram Stain     Final   Value: NO WBC SEEN     NO ORGANISMS SEEN     Performed by [facility] V RN AT 2031 ON 09/27/13 BY HAMER N     Performed at [facility]   Culture     Final   Value: MODERATE STAPHYLOCOCCUS SPECIES (COAGULASE NEGATIVE)     Performed at [facility]   Report Status 09/30/2013 FINAL   Final  ANAEROBIC CULTURE     Status: None   Collection Time    09/27/13  6:05 PM      Result Value Range Status   Specimen Description ABSCESS LEFT FOOT   Final   Special Requests NONE   Final   Gram Stain     Final   Value: NO WBC SEEN     NO ORGANISMS SEEN     Performed by [facility] V RN 2031 ON 09/27/13 BY HAMER N     Performed at [facility]   Culture     Final   Value: NO ANAEROBES ISOLATED     Performed at [facility]   Report Status 10/02/2013 FINAL   Final  GRAM STAIN     Status: None   Collection Time    09/27/13  6:05 PM      Result Value Range Status   Specimen Description ABSCESS   Final   Special Requests LEFT FOOT   Final   Gram Stain     Final   Value: NO WBC SEEN     NO ORGANISMS SEEN     Gram Stain Report Called to,Read Back By and Verified With: [facility] RN 2031 [id number] HAMER,N   Report  Status 09/27/2013 FINAL   Final  CLOSTRIDIUM DIFFICILE BY PCR     Status: None   Collection Time    10/02/13  8:43 AM      Result Value Range Status   C difficile by pcr NEGATIVE  NEGATIVE Final   Comment: Performed at [facility]     Scheduled Meds: . clindamycin  300 mg Oral Q6H  . enoxaparin (LOVENOX) injection  40 mg Subcutaneous Q24H  . ferrous gluconate  324 mg Oral BID WC  . influenza vac split quadrivalent PF  0.5 mL Intramuscular Tomorrow-1000  . insulin aspart  0-15 Units Subcutaneous TID WC  . insulin aspart  0-5 Units Subcutaneous QHS  . insulin aspart  9 Units Subcutaneous TID WC  . insulin glargine  40 Units Subcutaneous QHS  . lisinopril  20 mg Oral Daily  . piperacillin-tazobactam (ZOSYN)  IV  3.375 g Intravenous Q8H  . vancomycin  1,500 mg Intravenous Q12H   Continuous Infusions:   Faye Ramsay, MD  [facility]  Pager [phone]  If 7PM-7AM, please contact night-coverage [email or web address] Password [email or web address] 10/04/2013, 2:46 PM   LOS: 8 days

## 2013-10-04 NOTE — Progress Notes (Signed)
HYDROTHERAPY TREATMENT. 10/04/13 1421 [id number]  Subjective Assessment   Subjective i  AM GOUNG TO SURGERY TOMORROW.  Patient and Family Stated Goals save my foot  Prior Treatments repeat I/D, amputation of 3rd toe , closure of dorsal and proximal plantar wounds on 10/02/13 by Dr. Ninfa Linden  Evaluation and Treatment  Evaluation and Treatment Procedures Explained to Patient/Family Yes  Evaluation and Treatment Procedures agreed to  [REMOVED] Wound 09/28/13 Other (Comment) Foot Left s/p I and D Left foot 09/27/13  Final Assessment Date/Final Assessment Time: 10/03/13 0850  Date First Assessed/Time First Assessed: 09/28/13 1254   Wound Type: Other (Comment)  Location: Foot  Location Orientation: Left  Wound Description (Comments): s/p I and D Left foot 09/27/13  Pre  Site / Wound Assessment Friable;Granulation tissue;Pale  % Wound base Red or Granulating 25% (remaining open woun is plantar surface)  % Wound base Yellow 65%  % Wound base Black 10% (tendon)  Drainage Amount Moderate (dressing is 2 days from surgery)  Drainage Description Serosanguineous;Other (Comment) (Bloody)  Treatment Cleansed;Hydrotherapy (Pulse lavage);Packing (Saline gauze);Other (Comment)  Dressing Type Gauze (Comment);Impregnated gauze (petrolatum);Moist to moist (xeroform to all areas of sutures and friable periwound)  Dressing Changed New  Hydrotherapy  Pulsed Lavage with Suction (psi) 8 psi (8)  Pulsed Lavage with Suction - Normal Saline Used 500 mL  Pulsed Lavage Tip Tip with splash shield  Pulsed lavage therapy - wound location L foot  Wound Therapy - Assess/Plan/Recommendations  Wound Therapy - Clinical Statement Pt's wounds have changed after I and D on 10/02/13 with closure of dorsal wound , 3rd toe amputation and closure of plantar wounds , leaving one area on plantar surface with tendons exposed. Xeroform placed over sutures and exposed tendon, moist  gauze placed in remaining opened area.. Plans for Iand D  10/05/13 and possible skin graft. Will check back on 2/7 fro need for PLS to continue.  Wound Therapy - Functional Problem List decreased sensation  Wound Therapy - Frequency 6X / week  Wound Therapy - Current Recommendations PT  Wound Therapy - Follow Up Recommendations Home health RN (HHPT)  Wound Plan check on after next surgery.  Wound Therapy Goals - Improve the function of patient's integumentary system by progressing the wound(s) through the phases of wound healing by:  Decrease Necrotic Tissue to 50  Decrease Necrotic Tissue - Progress Progressing toward goal  Increase Granulation Tissue to 50  Increase Granulation Tissue - Progress Progressing toward goal  Decrease Length/Width/Depth by (cm) (NA as pt has been closed and may have a graft.)  Time For Goal Achievement 2 weeks  Wound Therapy - Potential for Goals Apolonio Schneiders PT [phone]

## 2013-10-04 NOTE — Progress Notes (Signed)
Patient ID: Mark [name], male   DOB: 28-Mar-1951, 63 y.o.   MRN: [id number] Will have PT wound service perform hydrotherapy on his left foot today.  I anticipate returning to the OR tomorrow afternoon for a repeat I&D and then possible skin grafting of his medial foot wound.

## 2013-10-05 ENCOUNTER — Encounter (HOSPITAL_COMMUNITY): Payer: Medicaid Other | Admitting: Registered Nurse

## 2013-10-05 ENCOUNTER — Encounter (HOSPITAL_COMMUNITY)
Admission: AD | Disposition: A | Discharge status: Rehabilitation Facility | Payer: Self-pay | Source: Ambulatory Visit | Attending: Internal Medicine

## 2013-10-05 ENCOUNTER — Encounter (HOSPITAL_COMMUNITY): Payer: Self-pay | Admitting: Registered Nurse

## 2013-10-05 ENCOUNTER — Inpatient Hospital Stay (HOSPITAL_COMMUNITY): Payer: Medicaid Other | Admitting: Registered Nurse

## 2013-10-05 HISTORY — PX: I & D EXTREMITY: SHX5045

## 2013-10-05 HISTORY — PX: SKIN SPLIT GRAFT: SHX444

## 2013-10-05 HISTORY — PX: APPLICATION OF WOUND VAC: SHX5189

## 2013-10-05 LAB — GLUCOSE, CAPILLARY
Glucose-Capillary: 68 mg/dL — ABNORMAL LOW (ref 70–99)
Glucose-Capillary: 95 mg/dL (ref 70–99)
Glucose-Capillary: 87 mg/dL (ref 70–99)
Glucose-Capillary: 63 mg/dL — ABNORMAL LOW (ref 70–99)
Glucose-Capillary: 79 mg/dL (ref 70–99)
Glucose-Capillary: 83 mg/dL (ref 70–99)
Glucose-Capillary: 84 mg/dL (ref 70–99)
Glucose-Capillary: 104 mg/dL — ABNORMAL HIGH (ref 70–99)
Glucose-Capillary: 90 mg/dL (ref 70–99)
Glucose-Capillary: 87 mg/dL (ref 70–99)
Glucose-Capillary: 154 mg/dL — ABNORMAL HIGH (ref 70–99)

## 2013-10-05 LAB — BASIC METABOLIC PANEL
BUN: 11 mg/dL (ref 6–23)
CO2: 30 mEq/L (ref 19–32)
Calcium: 8.4 mg/dL (ref 8.4–10.5)
Chloride: 100 mEq/L (ref 96–112)
Creatinine, Ser: 0.89 mg/dL (ref 0.50–1.35)
GFR calc Af Amer: >90 mL/min (ref >90)
GFR calc non Af Amer: 90 mL/min — ABNORMAL LOW (ref >90)
Glucose, Bld: 77 mg/dL (ref 70–99)
Potassium: 4 mEq/L (ref 3.7–5.3)
Sodium: 138 mEq/L (ref 137–147)

## 2013-10-05 LAB — CBC
HCT: 29.9 % — ABNORMAL LOW (ref 39.0–52.0)
Hemoglobin: 10 g/dL — ABNORMAL LOW (ref 13.0–17.0)
MCH: 30.7 pg (ref 26.0–34.0)
MCHC: 33.4 g/dL (ref 30.0–36.0)
MCV: 91.7 fL (ref 78.0–100.0)
Platelets: 853 10*3/uL — ABNORMAL HIGH (ref 150–400)
RBC: 3.26 MIL/uL — ABNORMAL LOW (ref 4.22–5.81)
RDW: 13.4 % (ref 11.5–15.5)
WBC: 13.4 10*3/uL — ABNORMAL HIGH (ref 4.0–10.5)

## 2013-10-05 SURGERY — IRRIGATION AND DEBRIDEMENT EXTREMITY
Anesthesia: General | Laterality: Left

## 2013-10-05 MED ORDER — SODIUM CHLORIDE 0.9 % IR SOLN
Status: DC | PRN
  Administered 2013-10-05: 1000 mL

## 2013-10-05 MED ORDER — PROPOFOL 10 MG/ML IV BOLUS
INTRAVENOUS | Status: AC
  Filled 2013-10-05: qty 20

## 2013-10-05 MED ORDER — FENTANYL CITRATE 0.05 MG/ML IJ SOLN
25.0000 ug | INTRAMUSCULAR | Status: DC | PRN
  Administered 2013-10-05: 25 ug via INTRAVENOUS
  Administered 2013-10-05: 50 ug via INTRAVENOUS
  Administered 2013-10-05: 25 ug via INTRAVENOUS

## 2013-10-05 MED ORDER — PROPOFOL 10 MG/ML IV BOLUS
INTRAVENOUS | Status: DC | PRN
  Administered 2013-10-05: 180 mg via INTRAVENOUS

## 2013-10-05 MED ORDER — ONDANSETRON HCL 4 MG/2ML IJ SOLN
INTRAMUSCULAR | Status: DC | PRN
  Administered 2013-10-05: 4 mg via INTRAVENOUS

## 2013-10-05 MED ORDER — MINERAL OIL LIGHT 100 % EX OIL
TOPICAL_OIL | CUTANEOUS | Status: AC
  Filled 2013-10-05: qty 25

## 2013-10-05 MED ORDER — MEPERIDINE HCL 50 MG/ML IJ SOLN
6.2500 mg | INTRAMUSCULAR | Status: DC | PRN
Start: 2013-10-05 — End: 2013-10-05

## 2013-10-05 MED ORDER — FENTANYL CITRATE 0.05 MG/ML IJ SOLN: 25.0000 ug | INTRAMUSCULAR | Status: DC | PRN

## 2013-10-05 MED ORDER — EPHEDRINE SULFATE 50 MG/ML IJ SOLN
INTRAMUSCULAR | Status: DC | PRN
  Administered 2013-10-05: 10 mg via INTRAVENOUS
  Administered 2013-10-05 (×3): 5 mg via INTRAVENOUS

## 2013-10-05 MED ORDER — LACTATED RINGERS IV SOLN: INTRAVENOUS | Status: DC

## 2013-10-05 MED ORDER — PHENYLEPHRINE HCL 10 MG/ML IJ SOLN
INTRAMUSCULAR | Status: DC | PRN
  Administered 2013-10-05 (×3): 40 ug via INTRAVENOUS

## 2013-10-05 MED ORDER — FENTANYL CITRATE 0.05 MG/ML IJ SOLN
INTRAMUSCULAR | Status: AC
  Filled 2013-10-05: qty 5

## 2013-10-05 MED ORDER — MIDAZOLAM HCL 5 MG/5ML IJ SOLN
INTRAMUSCULAR | Status: DC | PRN
  Administered 2013-10-05: 2 mg via INTRAVENOUS

## 2013-10-05 MED ORDER — PROMETHAZINE HCL 25 MG/ML IJ SOLN: 6.2500 mg | INTRAMUSCULAR | Status: DC | PRN

## 2013-10-05 MED ORDER — LIDOCAINE HCL (CARDIAC) 20 MG/ML IV SOLN
INTRAVENOUS | Status: AC
  Filled 2013-10-05: qty 5

## 2013-10-05 MED ORDER — EPINEPHRINE HCL 1 MG/ML IJ SOLN
INTRAMUSCULAR | Status: AC
  Filled 2013-10-05: qty 1

## 2013-10-05 MED ORDER — DEXTROSE 50 % IV SOLN
INTRAVENOUS | Status: AC
  Administered 2013-10-05: 06:00:00
  Filled 2013-10-05: qty 50

## 2013-10-05 MED ORDER — MEPERIDINE HCL 50 MG/ML IJ SOLN: 6.2500 mg | INTRAMUSCULAR | Status: DC | PRN

## 2013-10-05 MED ORDER — FENTANYL CITRATE 0.05 MG/ML IJ SOLN
INTRAMUSCULAR | Status: DC | PRN
  Administered 2013-10-05 (×2): 50 ug via INTRAVENOUS

## 2013-10-05 MED ORDER — LIDOCAINE HCL (CARDIAC) 20 MG/ML IV SOLN
INTRAVENOUS | Status: DC | PRN
  Administered 2013-10-05: 80 mg via INTRAVENOUS

## 2013-10-05 MED ORDER — LACTATED RINGERS IV SOLN
INTRAVENOUS | Status: DC
  Administered 2013-10-05: 12:00:00 via INTRAVENOUS

## 2013-10-05 MED ORDER — DEXTROSE 50 % IV SOLN
INTRAVENOUS | Status: AC
  Filled 2013-10-05: qty 50

## 2013-10-05 MED ORDER — DEXTROSE 50 % IV SOLN: 1.0000 | Freq: Once | INTRAVENOUS | Status: DC

## 2013-10-05 MED ORDER — FENTANYL CITRATE 0.05 MG/ML IJ SOLN
INTRAMUSCULAR | Status: AC
  Filled 2013-10-05: qty 2

## 2013-10-05 MED ORDER — PHENYLEPHRINE 40 MCG/ML (10ML) SYRINGE FOR IV PUSH (FOR BLOOD PRESSURE SUPPORT)
PREFILLED_SYRINGE | INTRAVENOUS | Status: AC
  Filled 2013-10-05: qty 10

## 2013-10-05 MED ORDER — ONDANSETRON HCL 4 MG/2ML IJ SOLN
INTRAMUSCULAR | Status: AC
Start: 2013-10-05 — End: 2013-10-05
  Filled 2013-10-05: qty 2

## 2013-10-05 MED ORDER — INSULIN ASPART 100 UNIT/ML ~~LOC~~ SOLN
3.0000 [IU] | Freq: Three times a day (TID) | SUBCUTANEOUS | Status: DC
  Administered 2013-10-06 – 2013-10-08 (×7): 3 [IU] via SUBCUTANEOUS
  Administered 2013-10-08: 18:00:00 via SUBCUTANEOUS
  Administered 2013-10-09 (×2): 3 [IU] via SUBCUTANEOUS

## 2013-10-05 MED ORDER — SUCCINYLCHOLINE CHLORIDE 20 MG/ML IJ SOLN
INTRAMUSCULAR | Status: DC | PRN
  Administered 2013-10-05: 100 mg via INTRAVENOUS

## 2013-10-05 MED ORDER — MIDAZOLAM HCL 2 MG/2ML IJ SOLN
INTRAMUSCULAR | Status: AC
Start: 2013-10-05 — End: 2013-10-05
  Filled 2013-10-05: qty 2

## 2013-10-05 MED ORDER — SODIUM CHLORIDE 0.9 % IR SOLN
Status: DC | PRN
  Administered 2013-10-05: 3000 mL

## 2013-10-05 MED ORDER — EPINEPHRINE HCL 1 MG/ML IJ SOLN
INTRAMUSCULAR | Status: DC | PRN
  Administered 2013-10-05: 2 mg

## 2013-10-05 MED ORDER — DEXTROSE 50 % IV SOLN
25.0000 mL | Freq: Once | INTRAVENOUS | Status: AC | PRN
  Administered 2013-10-05: 25 mL via INTRAVENOUS

## 2013-10-05 MED ORDER — MINERAL OIL LIGHT 100 % EX OIL
TOPICAL_OIL | CUTANEOUS | Status: DC | PRN
  Administered 2013-10-05 (×2): 1 via TOPICAL

## 2013-10-05 MED ORDER — DEXTROSE 50 % IV SOLN
INTRAVENOUS | Status: DC | PRN
  Administered 2013-10-05: 25 mL via INTRAVENOUS

## 2013-10-05 SURGICAL SUPPLY — 34 items
BANDAGE ELASTIC 4 VELCRO ST LF (GAUZE/BANDAGES/DRESSINGS) ×1 IMPLANT
BANDAGE ELASTIC 6 VELCRO ST LF (GAUZE/BANDAGES/DRESSINGS) ×1 IMPLANT
BLADE DERMATOME SS (BLADE) ×2 IMPLANT
BNDG GAUZE ELAST 4 BULKY (GAUZE/BANDAGES/DRESSINGS) ×2 IMPLANT
DEPRESSOR TONGUE BLADE STERILE (MISCELLANEOUS) ×4 IMPLANT
DERMACARRIERS GRAFT 1 TO 1.5 (DISPOSABLE)
DRSG ADAPTIC 3X8 NADH LF (GAUZE/BANDAGES/DRESSINGS) ×2 IMPLANT
DRSG VAC ATS MED SENSATRAC (GAUZE/BANDAGES/DRESSINGS) ×1 IMPLANT
ELECT REM PT RETURN 9FT ADLT (ELECTROSURGICAL)
ELECTRODE REM PT RTRN 9FT ADLT (ELECTROSURGICAL) IMPLANT
GAUZE XEROFORM 5X9 LF (GAUZE/BANDAGES/DRESSINGS) IMPLANT
GLOVE BIO SURGEON STRL SZ7.5 (GLOVE) ×2 IMPLANT
GLOVE BIOGEL PI IND STRL 8 (GLOVE) ×1 IMPLANT
GLOVE BIOGEL PI INDICATOR 8 (GLOVE) ×1
GLOVE ECLIPSE 8.0 STRL XLNG CF (GLOVE) ×2 IMPLANT
GOWN STRL REUS W/TWL XL LVL3 (GOWN DISPOSABLE) ×2 IMPLANT
GRAFT DERMACARRIERS 1 TO 1.5 (DISPOSABLE) IMPLANT
HANDPIECE INTERPULSE COAX TIP (DISPOSABLE)
KIT BASIN OR (CUSTOM PROCEDURE TRAY) ×2 IMPLANT
MANIFOLD NEPTUNE II (INSTRUMENTS) ×2 IMPLANT
PACK LOWER EXTREMITY WL (CUSTOM PROCEDURE TRAY) ×2 IMPLANT
PAD ABD 8X10 STRL (GAUZE/BANDAGES/DRESSINGS) ×2 IMPLANT
PADDING CAST COTTON 6X4 STRL (CAST SUPPLIES) IMPLANT
POSITIONER SURGICAL ARM (MISCELLANEOUS) ×2 IMPLANT
SET HNDPC FAN SPRY TIP SCT (DISPOSABLE) IMPLANT
SPONGE GAUZE 4X4 12PLY (GAUZE/BANDAGES/DRESSINGS) ×3 IMPLANT
SPONGE LAP 18X18 X RAY DECT (DISPOSABLE) ×6 IMPLANT
STAPLER VISISTAT 35W (STAPLE) ×2 IMPLANT
SUT ETHILON 2 0 PS N (SUTURE) ×1 IMPLANT
SUT MNCRL AB 4-0 PS2 18 (SUTURE) ×4 IMPLANT
TOWEL OR 17X26 10 PK STRL BLUE (TOWEL DISPOSABLE) ×6 IMPLANT
TUBING CONNECTING 10 (TUBING) IMPLANT
UNDERPAD 30X30 INCONTINENT (UNDERPADS AND DIAPERS) ×2 IMPLANT
YANKAUER SUCT BULB TIP NO VENT (SUCTIONS) IMPLANT

## 2013-10-05 NOTE — Progress Notes (Signed)
Patient ID: RYN SKUBAL, male   DOB: March 06, 1951, 63 y.o.   MRN: [id number]  [facility] PROGRESS NOTE  MERCEDES GOODLOW [id number] DOB: 14-Feb-1951 DOA: 09/26/2013 PCP: No PCP Per Patient  Brief narrative:  63 y.o. male with a past medical history of poorly controlled type 2 diabetes mellitus, medication nonadherence, presents as a direct admit from the urgent care Center. He complained of a two-week history of left foot pain, swelling, erythema which has significantly worsened in the last 2-3 days prior to this admission. He reported associated subjective fevers, chills, malaise. He was found to be febrile at the urgent care center having a temperature 102.7 with lab work showed a white count of 26.8 and a glucose of 359. Patient admitted for sepsis due to cellulitis and abscess of left foot.   Assessment/Plan:  Sepsis due to Cellulitis of foot with abscess and concern for necrotizing fascitis  - Patient presented with marked leukocytosis and fever  - Xray of foot concerning for necrotizing fascitis, an MRI c/w cellulitis and small abscesses, concern for necrotizing fascitis.  - ortho following, pt is status post I&D (done 01/29), hydrotherapy started on 1/30. 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 reports not on any meds due to inability to afford. CM consulted. Will need insulin upon d/c.  - FSG on soft side, resume lantus once pt start regular diet  - Emphasize on medication compliance.  Leukocytosis  - secondary to cellulitis and now post op demargination and  stress reaction  - continue ABX as noted below and repeat CBC in AM  Anemia of chronic disease  - drop in Hg over the past 24 hours, no signs of active bleeding  - CBC in AM  Hypertension  - continue lisinopril and prn hydralazine  - may need scheduled hydralazine if SBP persistently > 150  Tobacco abuse  - counseled on cessation  Hypokalemia  - continue to supplement and repeat BMP in AM  Malnutrition  - Added supplements   Code Status: full  Family Communication: Pt at bedside  Disposition Plan: skilled nursing facility when medically ready   Consultants:  Ortho  Procedures:  02/03 --> Repeat irrigation and debridement of left foot including removal of necrotic skin, soft tissue, fascia, and muscle. 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 SURGEON: Dr. Lind Guest. Blackman Antibiotics:  IV vanco and zosyn / cefepime ( 1/28>>), completed 7 day of vanco and cefepime  Change Maxipime to Zosyn 02/04 -->  Clindamycin 02/05 -->   HPI/Subjective: No events overnight.   Objective: Filed Vitals:   10/04/13 0518 10/04/13 1357 10/04/13 2129 10/05/13 0553  BP: 121/70 169/77 134/68 123/67  Pulse: 68 82 88 82  Temp: 98.9 F (37.2 C) 98.2 F (36.8 C) 98.7 F (37.1 C) 98.3 F (36.8 C)  TempSrc: Oral Oral Oral Oral  Resp: 18 18 20 18   Height:      Weight:      SpO2: 97% 94% 95% 92%    Intake/Output Summary (Last 24 hours) at 10/05/13  [id number] Last data filed at 10/05/13 0900  Gross per 24 hour  Intake    360 ml  Output   2811 ml  Net  -2451 ml    Exam:   General:  Pt is alert, follows commands appropriately, not in acute distress  Cardiovascular: Regular rate and rhythm, S1/S2, no murmurs, no rubs, no gallops  Respiratory: Clear to auscultation bilaterally, no wheezing, no crackles, no rhonchi  Abdomen: Soft, non tender, non distended, bowel sounds present, no guarding  Extremities: No edema, pulses DP and PT palpable bilaterally  Data  Reviewed: Basic Metabolic Panel:  Recent Labs Lab 09/29/13 0540 10/02/13 0929 10/04/13 0510 10/05/13 0455  NA 134*  --  137 138  K 3.6*  --  4.1 4.0  CL 99  --  100 100  CO2 26  --  29 30  GLUCOSE 257*  --  166* 77  BUN 15  --  12 11  CREATININE 0.88 0.76 0.87 0.89  CALCIUM 8.0*  --  8.2* 8.4   CBC:  Recent Labs Lab 09/29/13 0540 09/30/13 0516 10/01/13 0505 10/02/13 0545 10/03/13 0520 10/04/13 0510 10/05/13 0455  WBC 21.1* 17.1* 16.8* 16.7* 20.1* 16.1* 13.4*  NEUTROABS 16.3* 12.0* 12.6*  --   --   --   --   HGB 10.2* 10.3* 10.1* 10.6* 11.0* 9.5* 10.0*  HCT 31.1* 30.8* 30.7* 31.6* 33.2* 28.8* 29.9*  MCV 92.6 91.7 91.4 91.6 92.7 92.6 91.7  PLT 545* 644* 653* 718* 742* 789* 853*   CBG:  Recent Labs Lab 10/04/13 1926 10/04/13 2204 10/05/13 0530 10/05/13 0602 10/05/13 0737  GLUCAP 151* 119* 68* 95 87    Recent Results (from the past 240 hour(s))  CULTURE, BLOOD (ROUTINE X 2)     Status: None   Collection Time    09/26/13 11:15 PM      Result Value Range Status   Specimen Description BLOOD RIGHT ANTECUBITAL   Final   Special Requests BOTTLES DRAWN AEROBIC AND ANAEROBIC 5CC   Final   Culture  Setup Time     Final   Value: 09/27/2013 03:35     Performed at [place]     Final   Value: NO GROWTH 5 DAYS     Performed at [facility]   Report Status 10/03/2013 FINAL   Final  CULTURE, BLOOD (ROUTINE X 2)     Status: None   Collection Time    09/26/13 11:20 PM      Result Value Range Status   Specimen Description BLOOD RIGHT HAND   Final   Special Requests BOTTLES DRAWN AEROBIC AND ANAEROBIC 5CC   Final   Culture  Setup Time     Final   Value: 09/27/2013 03:34     Performed at [place]     Final   Value: NO GROWTH 5 DAYS     Performed at [facility]   Report Status 10/03/2013 FINAL   Final  SURGICAL PCR SCREEN     Status: Abnormal   Collection Time    09/27/13  4:17 PM      Result Value Range  Status   MRSA, PCR NEGATIVE  NEGATIVE Final   Staphylococcus aureus POSITIVE (*) NEGATIVE Final   Comment:            The Xpert SA Assay (FDA     approved for NASAL specimens     in patients over 21 years  of age),     is one component of     a comprehensive surveillance     program.  Test performance has     been validated by [employer] for patients greater     than or equal to 72 year old.     It is not intended     to diagnose infection nor to     guide or monitor treatment.  CULTURE, ROUTINE-ABSCESS     Status: None   Collection Time    09/27/13  6:05 PM      Result Value Range Status   Specimen Description ABSCESS LEFT FOOT   Final   Special Requests NONE   Final   Gram Stain     Final   Value: NO WBC SEEN     NO ORGANISMS SEEN     Performed by [facility] V RN AT 2031 ON 09/27/13 BY HAMER N     Performed at [facility]   Culture     Final   Value: MODERATE STAPHYLOCOCCUS SPECIES (COAGULASE NEGATIVE)     Performed at [facility]   Report Status 09/30/2013 FINAL   Final  ANAEROBIC CULTURE     Status: None   Collection Time    09/27/13  6:05 PM      Result Value Range Status   Specimen Description ABSCESS LEFT FOOT   Final   Special Requests NONE   Final   Gram Stain     Final   Value: NO WBC SEEN     NO ORGANISMS SEEN     Performed by [facility] V RN 2031 ON 09/27/13 BY HAMER N     Performed at [facility]   Culture     Final   Value: NO ANAEROBES ISOLATED     Performed at [facility]   Report Status 10/02/2013 FINAL   Final  GRAM STAIN     Status: None   Collection Time    09/27/13  6:05 PM      Result Value Range Status   Specimen Description ABSCESS   Final   Special Requests LEFT FOOT   Final   Gram Stain     Final   Value: NO WBC SEEN     NO ORGANISMS SEEN     Gram Stain Report Called to,Read Back By and Verified With: [facility] RN 2031 [id number] HAMER,N   Report Status  09/27/2013 FINAL   Final  CLOSTRIDIUM DIFFICILE BY PCR     Status: None   Collection Time    10/02/13  8:43 AM      Result Value Range Status   C difficile by pcr NEGATIVE  NEGATIVE Final   Comment: Performed at [facility]     Scheduled Meds: . clindamycin  300 mg Oral Q6H  . enoxaparin (LOVENOX) injection  40 mg Subcutaneous Q24H  . ferrous gluconate  324 mg Oral BID WC  . influenza vac split quadrivalent PF  0.5 mL Intramuscular Tomorrow-1000  . insulin aspart  0-15 Units Subcutaneous TID WC  . insulin aspart  0-5 Units Subcutaneous QHS  . insulin aspart  9 Units Subcutaneous TID WC  . insulin glargine  40 Units Subcutaneous QHS  . lisinopril  20 mg Oral Daily  . piperacillin-tazobactam (ZOSYN)  IV  3.375 g Intravenous Q8H  . vancomycin  1,500 mg Intravenous Q12H   Continuous Infusions:  Faye Ramsay, MD  [facility] Pager [phone]  If 7PM-7AM, please contact night-coverage [email or web address] Password [email or web address] 10/05/2013, 9:33 AM   LOS: 9 days

## 2013-10-05 NOTE — Progress Notes (Signed)
Pt arrived from PACU post debridement of left foot with skin graft, wound VAC to lt foot in place.  Alert and oriented, no distress , will continue with current plan of care.

## 2013-10-05 NOTE — Transfer of Care (Signed)
Immediate Anesthesia Transfer of Care Note  Patient: JARI VANSLOOTEN  Procedure(s) Performed: Procedure(s): REPEAT IRRIGATION AND DEBRIDEMENT LEFT FOOT, SPLIT THICKNESS SKIN GRAFT (Left) SKIN GRAFT SPLIT THICKNESS (Left)  Patient Location: PACU  Anesthesia Type:General  Level of Consciousness: awake, alert , oriented and patient cooperative  Airway & Oxygen Therapy: Patient Spontanous Breathing and Patient connected to face mask oxygen  Post-op Assessment: Report given to PACU RN, Post -op Vital signs reviewed and stable and Patient moving all extremities  Post vital signs: Reviewed and stable  Complications: No apparent anesthesia complications

## 2013-10-05 NOTE — Anesthesia Postprocedure Evaluation (Signed)
  Anesthesia Post-op Note  Patient: Mark [name]  Procedure(s) Performed: Procedure(s) (LRB): REPEAT IRRIGATION AND DEBRIDEMENT LEFT FOOT, SPLIT THICKNESS SKIN GRAFT (Left) SKIN GRAFT SPLIT THICKNESS (Left)  Patient Location: PACU  Anesthesia Type: General  Level of Consciousness: awake and alert   Airway and Oxygen Therapy: Patient Spontanous Breathing  Post-op Pain: mild  Post-op Assessment: Post-op Vital signs reviewed, Patient's Cardiovascular Status Stable, Respiratory Function Stable, Patent Airway and No signs of Nausea or vomiting  Last Vitals:  Filed Vitals:   10/05/13 1500  BP: 149/80  Pulse: 77  Temp:   Resp: 16    Post-op Vital Signs: stable   Complications: No apparent anesthesia complications

## 2013-10-05 NOTE — Preoperative (Signed)
Beta Blockers   Reason not to administer Beta Blockers:Not Applicable 

## 2013-10-05 NOTE — Progress Notes (Signed)
CSW continuing to follow for assessment after surgery.  Minyon Billiter C. [place] MSW, [place]

## 2013-10-05 NOTE — Progress Notes (Signed)
Hypoglycemic Event  CBG:62  Treatment: D50 IV 25 mL  Symptoms: None  Follow-up CBG: Time:1240 CBG Result:OR  Possible Reasons for Event: Inadequate meal intake  Comments/MD notified:[place]    Mark [name]  Remember to initiate Hypoglycemia Order Set & complete

## 2013-10-05 NOTE — Anesthesia Preprocedure Evaluation (Addendum)
Anesthesia Evaluation  Patient identified by MRN, date of birth, ID band Patient awake    Reviewed: Allergy & Precautions, H&P , NPO status , Patient's Chart, lab work & pertinent test results  Airway Mallampati: II TM Distance: >3 FB Neck ROM: Full    Dental no notable dental hx.    Pulmonary Current Smoker,  breath sounds clear to auscultation  Pulmonary exam normal       Cardiovascular negative cardio ROS  Rhythm:Regular Rate:Normal     Neuro/Psych negative neurological ROS  negative psych ROS   GI/Hepatic Neg liver ROS, GERD-  Poorly Controlled,  Endo/Other  diabetes, Poorly Controlled, Insulin Dependent  Renal/GU negative Renal ROS  negative genitourinary   Musculoskeletal negative musculoskeletal ROS (+)   Abdominal   Peds negative pediatric ROS (+)  Hematology  (+) anemia ,   Anesthesia Other Findings   Reproductive/Obstetrics negative OB ROS                          Anesthesia Physical  Anesthesia Plan  ASA: III  Anesthesia Plan: General   Post-op Pain Management:    Induction: Intravenous  Airway Management Planned: Oral ETT  Additional Equipment:   Intra-op Plan:   Post-operative Plan: Extubation in OR  Informed Consent: I have reviewed the patients History and Physical, chart, labs and discussed the procedure including the risks, benefits and alternatives for the proposed anesthesia with the patient or authorized representative who has indicated his/her understanding and acceptance.   Dental advisory given  Plan Discussed with: CRNA and Surgeon  Anesthesia Plan Comments:         Anesthesia Quick Evaluation

## 2013-10-05 NOTE — Progress Notes (Signed)
ANTIBIOTIC CONSULT NOTE - FOLLOW UP  Pharmacy Consult for vancomycin, Zosyn Indication: sepsis secondary to cellulitis of foot with abscess and concern for necrotizing fasciitis  No Known Allergies  Patient Measurements: Height: 6\' 1"  (185.4 cm) Weight: 206 lb (93.441 kg) IBW/kg (Calculated) : 79.9  Vital Signs: Temp: 98.3 F (36.8 C) (02/06 0553) Temp src: Oral (02/06 0553) BP: 123/67 mmHg (02/06 0553) Pulse Rate: 82 (02/06 0553) Intake/Output from previous day: 02/05 0701 - 02/06 0700 In: 720 [P.O.:720] Out: 2812 [Urine:2810; Stool:2] Intake/Output from this shift:    Labs:  Recent Labs  10/03/13 0520 10/04/13 0510 10/05/13 0455  WBC 20.1* 16.1* 13.4*  HGB 11.0* 9.5* 10.0*  PLT 742* 789* 853*  CREATININE  --  0.87 0.89   Estimated Creatinine Clearance: 97.3 ml/min (by C-G formula based on Cr of 0.89).   Assessment: 89 yoM with PMH of poorly controlled T2DM, medication nonadherence, presented 1/28 as a direct admit from the urgent care center. He complained of a two-week history of left foot pain, swelling, erythema which has significantly worsened. Pt with evidence of sepsis seondary cellulitis, empiric antibiotics started. Xray of foot showed foreign body between 2nd and 3rd toe and large amount of gas tracking along the foot concerning for necrotizing fascitis. An MRI of the foot was done which again showed cellulitis with multiple small abscesses and concern for necrotizing fascitis.   1/28 >> Zosyn >> 1/29 1/ 29>> vancomycin >>  1/29 >> cefepime >> 2/4 2/4 >>   Zosyn 2/5 >>   clindamycin  Cultures: 1/28 blood >> No growth, FINAL  1/29 abscess >> coagulase-negative Staph 2/2 C.diff PCR >> negative    Dose changes/drug level info:  1/31 Vancomycin trough = 12.7 on 1250mg  q12h, increased to 1500 mg q12h 2/3   Vancomycin trough = 16.2 on 1500 mg q12h  Now on D#7 vancomycin (1500mg  q12h), D#3 Zosyn (3.375 grams IV q8h, each dose over 4 hours), D#2  clindamycin 300 mg PO q6h.    Plans for I&D in OR this PM noted.  WBC improving SCr stable.  Goal of Therapy:  Vancomycin trough level 15-20 mcg/ml Appropriate dosing of antibiotics; eradication of infection  Plan:   Continue Vancomycin 1500 mg IV q12h  Continue Zosyn 3.375 grams IV q8h  Continue clindamycin as ordered by MD (300 mg PO q6h)  Will recheck vancomycin trough and serum creatinine on 10/08/13 unless clinical course requires checking sooner.  Clayburn Pert, PharmD, BCPS Pager: [phone] 10/05/2013  10:12 AM

## 2013-10-05 NOTE — Brief Op Note (Signed)
09/26/2013 - 10/05/2013  2:20 PM  PATIENT:  Mark [name]  63 y.o. male  PRE-OPERATIVE DIAGNOSIS:  Infection, abscess left foot  POST-OPERATIVE DIAGNOSIS:  Infection, abscess left foot  PROCEDURE:  Procedure(s): REPEAT IRRIGATION AND DEBRIDEMENT LEFT FOOT, SPLIT THICKNESS SKIN GRAFT (Left) SKIN GRAFT SPLIT THICKNESS (Left)  SURGEON:  Surgeon(s) and Role:    * Mcarthur Rossetti, MD - Primary  PHYSICIAN ASSISTANT: Benita Stabile, PA-c  ANESTHESIA:   general  EBL:  Total I/O In: 1000 [I.V.:1000] Out: 675 [Urine:675]  BLOOD ADMINISTERED:none  DRAINS: none   LOCAL MEDICATIONS USED:  NONE  SPECIMEN:  No Specimen  DISPOSITION OF SPECIMEN:  N/A  COUNTS:  YES  TOURNIQUET:  * No tourniquets in log *  DICTATION: .Other Dictation: Dictation Number [id number]  PLAN OF CARE: Admit to inpatient   PATIENT DISPOSITION:  PACU - hemodynamically stable.   Delay start of Pharmacological VTE agent (>24hrs) due to surgical blood loss or risk of bleeding: no

## 2013-10-05 NOTE — Progress Notes (Signed)
Blood sugar 68, 37ml 50% dextrose given, rechecked 15 minutes later blood sugar 95

## 2013-10-05 NOTE — Progress Notes (Signed)
Patient ID: Mark [name], male   DOB: 22-Dec-1950, 63 y.o.   MRN: [id number] Plan on proceeding to the OR today (early afternoon)  For a repeat I&D of his left foot and hopefully a skin grafting.

## 2013-10-05 NOTE — Progress Notes (Signed)
Inpatient Diabetes Program Recommendations  AACE/ADA: New Consensus Statement on Inpatient Glycemic Control (2013)  Target Ranges:  Prepandial:   less than 140 mg/dL      Peak postprandial:   less than 180 mg/dL (1-2 hours)      Critically ill patients:  140 - 180 mg/dL   Reason for Visit: Hypoglycemia  In surgery for I&D of L foot and skin graphing. Was NPO last night. Had hypoglycemia this am.  Diabetes history: Type 2 DM Outpatient Diabetes medications: None Current orders for Inpatient glycemic control: Lantus 40 QHS, Novolog 9 units tidwc and Novolog moderate tidwc and hs  Inpatient Diabetes Program Recommendations Insulin - Basal: Decrease Lantus to 32 units QHS Insulin - Meal Coverage: Add Novolog 4 units tidwc for meal coverage insulin HgbA1C: 13.7% - uncontrolled Outpatient Referral: OP Diabetes Education for uncontrolled DM - will order  Note: Will need to be discharged on affordable insulin.  Recommend Humulin 70/30 26 units bid. (Cost is $24.88 at [facility])  Will need f/u with PCP to manage DM.  Will also need prescription for glucose meter and strips.  Will continue to follow. Thank you. Lorenda Peck, RD, LDN, CDE Inpatient Diabetes Coordinator [phone]

## 2013-10-06 LAB — BASIC METABOLIC PANEL
BUN: 10 mg/dL (ref 6–23)
CO2: 31 mEq/L (ref 19–32)
Calcium: 8.3 mg/dL — ABNORMAL LOW (ref 8.4–10.5)
Chloride: 100 mEq/L (ref 96–112)
Creatinine, Ser: 1.05 mg/dL (ref 0.50–1.35)
GFR calc Af Amer: 86 mL/min — ABNORMAL LOW (ref >90)
GFR calc non Af Amer: 74 mL/min — ABNORMAL LOW (ref >90)
Glucose, Bld: 137 mg/dL — ABNORMAL HIGH (ref 70–99)
Potassium: 4.1 mEq/L (ref 3.7–5.3)
Sodium: 139 mEq/L (ref 137–147)

## 2013-10-06 LAB — CBC
HCT: 29.7 % — ABNORMAL LOW (ref 39.0–52.0)
Hemoglobin: 9.6 g/dL — ABNORMAL LOW (ref 13.0–17.0)
MCH: 30.3 pg (ref 26.0–34.0)
MCHC: 32.3 g/dL (ref 30.0–36.0)
MCV: 93.7 fL (ref 78.0–100.0)
Platelets: 871 10*3/uL — ABNORMAL HIGH (ref 150–400)
RBC: 3.17 MIL/uL — ABNORMAL LOW (ref 4.22–5.81)
RDW: 13.4 % (ref 11.5–15.5)
WBC: 15.6 10*3/uL — ABNORMAL HIGH (ref 4.0–10.5)

## 2013-10-06 LAB — GLUCOSE, CAPILLARY
Glucose-Capillary: 117 mg/dL — ABNORMAL HIGH (ref 70–99)
Glucose-Capillary: 102 mg/dL — ABNORMAL HIGH (ref 70–99)
Glucose-Capillary: 114 mg/dL — ABNORMAL HIGH (ref 70–99)
Glucose-Capillary: 104 mg/dL — ABNORMAL HIGH (ref 70–99)

## 2013-10-06 NOTE — Progress Notes (Signed)
Clinical Social Work Department BRIEF PSYCHOSOCIAL ASSESSMENT 10/06/2013  Patient:  Mark [name], Mark [name]     Account Number:  [id number]     Admit date:  09/26/2013  Clinical Social Worker:  Levie Heritage  Date/Time:  10/06/2013 01:15 PM  Referred by:  Physician  Date Referred:  10/06/2013 Referred for  SNF Placement   Other Referral:   Interview type:  Patient Other interview type:    PSYCHOSOCIAL DATA Living Status:  FAMILY Admitted from facility:   Level of care:   Primary support name:  Lige Lakeman Primary support relationship to patient:  SPOUSE Degree of support available:   strong    CURRENT CONCERNS Current Concerns  Post-Acute Placement   Other Concerns:    SOCIAL WORK ASSESSMENT / PLAN Met with Pt to discuss d/c plans.    Pt stated that he's unsure of his d/c plans, as he thought that he would need SNF but MD's note says home with [facility].    Pt stated that he is agreeable to SNF, should PT and MD feel it necessary.    Pt asked that CSW wait until Monday to begin SNF search, as he wants to see how his wound is doing and wants to confer with MD on best d/c plan.    Weekday CSW to meet with Pt on Monday to solidify d/c plans.    CSW and Pt discussed Pt applying for disability.  CSW informed Pt that he has to apply on-line or in-person at the [employer] on [place].  Pt voiced an understanding.  CSW thanked Pt for his time.   Assessment/plan status:  Psychosocial Support/Ongoing Assessment of Needs Other assessment/ plan:   Information/referral to community resources:    PATIENT'S/FAMILY'S RESPONSE TO PLAN OF CARE: Pt was calm, cooperative and very pleasant.  Pt voiced an understanding of his potential need for SNF and stated that he'd be agreeable to this if medical professionals felt it necessary.    Pt thanked CSW for time and assistance.   Bernita Raisin, [place] Work [phone]

## 2013-10-06 NOTE — Progress Notes (Signed)
Patient ID: Mark [name], male   DOB: Jun 06, 1951, 63 y.o.   MRN: [id number] [facility] PROGRESS NOTE  Mark [name] [id number] DOB: 09-12-1950 DOA: 09/26/2013 PCP: No PCP Per Patient  Brief narrative:  63 y.o. male with a past medical history of poorly controlled type 2 diabetes mellitus, medication nonadherence, presents as a direct admit from the urgent care Center. He complained of a two-week history of left foot pain, swelling, erythema which has significantly worsened in the last 2-3 days prior to this admission. He reported associated subjective fevers, chills, malaise. He was found to be febrile at the urgent care center having a temperature 102.7 with lab work showed a white count of 26.8 and a glucose of 359. Patient admitted for sepsis due to cellulitis and abscess of left foot.   Assessment/Plan:  Sepsis due to Cellulitis of foot with abscess and concern for necrotizing fascitis  - Patient presented with marked leukocytosis and fever  - Xray of foot concerning for necrotizing fascitis, an MRI c/w cellulitis and small abscesses, concern for necrotizing fascitis.  - ortho following, pt is status post I&D (done 01/29), hydrotherapy started on 1/30. 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 - continue analgesia and broad spectrum ABX  Uncontrolled DM with complications of neuropathy and anemia of chronic disease  - A1C of 13.7 . reports not on any meds due to inability to afford. CM consulted. Will need insulin upon d/c.  - FSG on soft side, resume lantus  once pt start regular diet  - Emphasize on medication compliance.  Leukocytosis  - secondary to cellulitis and now post op demargination and stress reaction  - continue ABX as noted below and repeat CBC in AM  Anemia of chronic disease  - drop in Hg over the past 24 hours, no signs of active bleeding  - CBC in AM  Hypertension  - continue lisinopril and prn hydralazine  - may need scheduled hydralazine if SBP persistently > 150  Tobacco abuse  - counseled on cessation  Hypokalemia  - continue to supplement and repeat BMP in AM  Malnutrition  - Added supplements   Code Status: full  Family Communication: Pt at bedside  Disposition Plan: skilled nursing facility when medically ready   Consultants:  Ortho  Procedures:  02/03 --> Repeat irrigation and debridement of left foot including removal of necrotic skin, soft tissue, fascia, and muscle. 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 SURGEON: Dr. Lind Guest. Blackman Antibiotics:  IV vanco and zosyn / cefepime ( 1/28>>), completed 7 day of vanco and cefepime  Change Maxipime to Zosyn 02/04 -->  Clindamycin 02/05 -->   HPI/Subjective: No events overnight.   Objective: Filed Vitals:   10/05/13 2126 10/06/13 0542 10/06/13 1127 10/06/13 1349  BP: 150/82 117/66 118/68 111/62  Pulse: 101 89  84  Temp: 99.9 F (37.7 C) 99.2 F (37.3 C)  99.4 F (37.4 C)  TempSrc: Oral Oral  Oral  Resp: 18 20  18   Height:      Weight:  SpO2: 93% 93%  94%    Intake/Output Summary (Last 24 hours) at 10/06/13 1400 Last data filed at 10/06/13 1200  Gross per 24 hour  Intake   1840 ml  Output   2360 ml  Net   -520 ml    Exam:   General:  Pt is alert, follows commands appropriately, not in acute distress  Cardiovascular: Regular rate and rhythm, S1/S2, no murmurs, no rubs, no gallops  Respiratory: Clear to auscultation bilaterally, no wheezing, no crackles, no rhonchi  Abdomen: Soft, non tender, non  distended, bowel sounds present, no guarding  Data Reviewed: Basic Metabolic Panel:  Recent Labs Lab 10/02/13 0929 10/04/13 0510 10/05/13 0455 10/06/13 0520  NA  --  137 138 139  K  --  4.1 4.0 4.1  CL  --  100 100 100  CO2  --  29 30 31   GLUCOSE  --  166* 77 137*  BUN  --  12 11 10   CREATININE 0.76 0.87 0.89 1.05  CALCIUM  --  8.2* 8.4 8.3*   CBC:  Recent Labs Lab 09/30/13 0516 10/01/13 0505 10/02/13 0545 10/03/13 0520 10/04/13 0510 10/05/13 0455 10/06/13 0520  WBC 17.1* 16.8* 16.7* 20.1* 16.1* 13.4* 15.6*  NEUTROABS 12.0* 12.6*  --   --   --   --   --   HGB 10.3* 10.1* 10.6* 11.0* 9.5* 10.0* 9.6*  HCT 30.8* 30.7* 31.6* 33.2* 28.8* 29.9* 29.7*  MCV 91.7 91.4 91.6 92.7 92.6 91.7 93.7  PLT 644* 653* 718* 742* 789* 853* 871*   CBG:  Recent Labs Lab 10/05/13 1425 10/05/13 1544 10/05/13 2148 10/06/13 0736 10/06/13 1123  GLUCAP 90 87 154* 117* 102*    Recent Results (from the past 240 hour(s))  CULTURE, BLOOD (ROUTINE X 2)     Status: None   Collection Time    09/26/13 11:15 PM      Result Value Range Status   Specimen Description BLOOD RIGHT ANTECUBITAL   Final   Special Requests BOTTLES DRAWN AEROBIC AND ANAEROBIC 5CC   Final   Culture  Setup Time     Final   Value: 09/27/2013 03:35     Performed at [facility]     Final   Value: NO GROWTH 5 DAYS     Performed at [facility]   Report Status 10/03/2013 FINAL   Final  CULTURE, BLOOD (ROUTINE X 2)     Status: None   Collection Time    09/26/13 11:20 PM      Result Value Range Status   Specimen Description BLOOD RIGHT HAND   Final   Special Requests BOTTLES DRAWN AEROBIC AND ANAEROBIC 5CC   Final   Culture  Setup Time     Final   Value: 09/27/2013 03:34     Performed at [facility]   Culture     Final   Value: NO GROWTH 5 DAYS     Performed at [facility]   Report Status 10/03/2013 FINAL   Final  SURGICAL PCR SCREEN     Status: Abnormal   Collection  Time    09/27/13  4:17 PM      Result Value Range Status   MRSA, PCR NEGATIVE  NEGATIVE Final   Staphylococcus aureus POSITIVE (*) NEGATIVE Final   Comment:            The Xpert SA Assay (FDA     approved for NASAL specimens  in patients over 49 years of age),     is one component of     a comprehensive surveillance     program.  Test performance has     been validated by [employer] for patients greater     than or equal to 71 year old.     It is not intended     to diagnose infection nor to     guide or monitor treatment.  CULTURE, ROUTINE-ABSCESS     Status: None   Collection Time    09/27/13  6:05 PM      Result Value Range Status   Specimen Description ABSCESS LEFT FOOT   Final   Special Requests NONE   Final   Gram Stain     Final   Value: NO WBC SEEN     NO ORGANISMS SEEN     Performed by [facility] V RN AT 2031 ON 09/27/13 BY HAMER N     Performed at [facility]   Culture     Final   Value: MODERATE STAPHYLOCOCCUS SPECIES (COAGULASE NEGATIVE)     Performed at [facility]   Report Status 09/30/2013 FINAL   Final  ANAEROBIC CULTURE     Status: None   Collection Time    09/27/13  6:05 PM      Result Value Range Status   Specimen Description ABSCESS LEFT FOOT   Final   Special Requests NONE   Final   Gram Stain     Final   Value: NO WBC SEEN     NO ORGANISMS SEEN     Performed by [facility] V RN 2031 ON 09/27/13 BY HAMER N     Performed at [facility]   Culture     Final   Value: NO ANAEROBES ISOLATED     Performed at [facility]   Report Status 10/02/2013 FINAL   Final  GRAM STAIN     Status: None   Collection Time    09/27/13  6:05 PM      Result Value Range Status   Specimen Description ABSCESS   Final   Special Requests LEFT FOOT   Final   Gram Stain     Final   Value: NO WBC SEEN     NO ORGANISMS SEEN     Gram Stain Report Called to,Read Back By and Verified With:  [facility] RN 2031 [id number] HAMER,N   Report Status 09/27/2013 FINAL   Final  CLOSTRIDIUM DIFFICILE BY PCR     Status: None   Collection Time    10/02/13  8:43 AM      Result Value Range Status   C difficile by pcr NEGATIVE  NEGATIVE Final   Comment: Performed at [facility]     Scheduled Meds: . clindamycin  300 mg Oral Q6H  . enoxaparin (LOVENOX) injection  40 mg Subcutaneous Q24H  . ferrous gluconate  324 mg Oral BID WC  . influenza vac split quadrivalent PF  0.5 mL Intramuscular Tomorrow-1000  . insulin aspart  0-15 Units Subcutaneous TID WC  . insulin aspart  0-5 Units Subcutaneous QHS  . insulin aspart  3 Units Subcutaneous TID WC  . insulin glargine  40 Units Subcutaneous QHS  . lisinopril  20 mg Oral Daily  . piperacillin-tazobactam (ZOSYN)  IV  3.375 g Intravenous Q8H  . vancomycin  1,500 mg Intravenous Q12H  Continuous Infusions:    Faye Ramsay, MD  [facility] Pager [phone]  If 7PM-7AM, please contact night-coverage [email or web address] Password [email or web address] 10/06/2013, 2:00 PM   LOS: 10 days

## 2013-10-06 NOTE — Progress Notes (Signed)
Patient ID: Mark [name], male   DOB: 11/09/1950, 63 y.o.   MRN: [id number]  Was able to place a skin graft on his left foot yesterday.  Will keep VAC on his left foot over the graft until Monday afternoon to allow the graft to mature.  I will remove the VAC myself at that point and can start dressing changes then with the goal of discharging to home by Tuesday on oral antibiotics, home health nursing from dressing supplies, and PT for mobility with non-weight bearing on his left foot for the next 4 weeks.

## 2013-10-07 LAB — BASIC METABOLIC PANEL
BUN: 10 mg/dL (ref 6–23)
CO2: 31 mEq/L (ref 19–32)
Calcium: 8.3 mg/dL — ABNORMAL LOW (ref 8.4–10.5)
Chloride: 100 mEq/L (ref 96–112)
Creatinine, Ser: 1.2 mg/dL (ref 0.50–1.35)
GFR calc Af Amer: 73 mL/min — ABNORMAL LOW (ref >90)
GFR calc non Af Amer: 63 mL/min — ABNORMAL LOW (ref >90)
Glucose, Bld: 149 mg/dL — ABNORMAL HIGH (ref 70–99)
Potassium: 4 mEq/L (ref 3.7–5.3)
Sodium: 140 mEq/L (ref 137–147)

## 2013-10-07 LAB — GLUCOSE, CAPILLARY
Glucose-Capillary: 67 mg/dL — ABNORMAL LOW (ref 70–99)
Glucose-Capillary: 70 mg/dL (ref 70–99)
Glucose-Capillary: 102 mg/dL — ABNORMAL HIGH (ref 70–99)
Glucose-Capillary: 139 mg/dL — ABNORMAL HIGH (ref 70–99)
Glucose-Capillary: 85 mg/dL (ref 70–99)
Glucose-Capillary: 128 mg/dL — ABNORMAL HIGH (ref 70–99)
Glucose-Capillary: 145 mg/dL — ABNORMAL HIGH (ref 70–99)

## 2013-10-07 LAB — CBC
HCT: 29.6 % — ABNORMAL LOW (ref 39.0–52.0)
Hemoglobin: 9.5 g/dL — ABNORMAL LOW (ref 13.0–17.0)
MCH: 30.1 pg (ref 26.0–34.0)
MCHC: 32.1 g/dL (ref 30.0–36.0)
MCV: 93.7 fL (ref 78.0–100.0)
Platelets: 950 10*3/uL (ref 150–400)
RBC: 3.16 MIL/uL — ABNORMAL LOW (ref 4.22–5.81)
RDW: 13.4 % (ref 11.5–15.5)
WBC: 15.9 10*3/uL — ABNORMAL HIGH (ref 4.0–10.5)

## 2013-10-07 LAB — VANCOMYCIN, TROUGH: Vancomycin Tr: 22.2 ug/mL — ABNORMAL HIGH (ref 10.0–20.0)

## 2013-10-07 MED ORDER — INSULIN GLARGINE 100 UNIT/ML ~~LOC~~ SOLN
15.0000 [IU] | Freq: Once | SUBCUTANEOUS | Status: AC
  Administered 2013-10-07: 15 [IU] via SUBCUTANEOUS
  Filled 2013-10-07: qty 0.15

## 2013-10-07 NOTE — Evaluation (Addendum)
Physical Therapy Evaluation Patient Details Name: Mark [name] MRN: [id number] DOB: 1951-05-22 Today's Date: 10/07/2013 Time: [id number] PT Time Calculation (min): 32 min  PT Assessment / Plan / Recommendation History of Present Illness  Pt admitted 09/26/13 with infection of L foot. Pt has H/O DM. Pt has had several  surgical I&D's, most recent  for L 3rd toe amputaion, wound closures, skin graft and wound VAC. for wound closure  Clinical Impression  Pt is very deconditioned. Much difficulty ambulating x 12',  R LEG is weak  And has potential to buckle. Pt could benefit from Post acute rehab at [facility] CIR vs SNF. If DC to home, pt may need to consider WC level until he gets strong enough for safe ambulation with AD and NWB. Pt will benefit from OT consult . Pt will benefit from PT to address problems listed to return to a functional independent level.    PT Assessment  Patient needs continued PT services    Follow Up Recommendations  CIR    Does the patient have the potential to tolerate intense rehabilitation      Barriers to Discharge Decreased caregiver support      Equipment Recommendations  Rolling walker with 5" wheels;Wheelchair (measurements PT) (Knee walker may be anoptio if pt has balance.)    Recommendations for Other Services OT consult;Rehab consult   Frequency Min 6X/week    Precautions / Restrictions Precautions Precautions: Fall Precaution Comments: wound VAC Restrictions LLE Weight Bearing: Non weight bearing   Pertinent Vitals/Pain C/o dizziness. BP after walking 155/77 HR 90 sats 99%a      Mobility  Bed Mobility Overal bed mobility: Needs Assistance Bed Mobility: Supine to Sit Supine to sit: Min assist General bed mobility comments: extra  time  for mobilizing to edge of bed, difficulty getting scooted to edge, noted edema of thighs and scrotum. Transfers Overall transfer level: Needs assistance Equipment used: Rolling walker (2 wheeled) Transfers:  Sit to/from Stand Sit to Stand: Mod assist;+2 safety/equipment;From elevated surface General transfer comment: cues fot UE use. safe NWB on LLE Ambulation/Gait Ambulation/Gait assistance: +2 safety/equipment;Mod assist Ambulation Distance (Feet): 12 Feet Assistive device: Rolling walker (2 wheeled) Gait Pattern/deviations: Step-to pattern General Gait Details: extra time rquired due to pt's deconditioned state. Pt stopped and stood to rest UE's. Pt was able to maintain NWB during ambulation  Discussed with pt about inspecting his feet daily with a mirror and wearing supportive shoes   Exercises     PT Diagnosis: Difficulty walking;Generalized weakness;Acute pain  PT Problem List: Decreased strength;Decreased activity tolerance;Decreased balance;Decreased mobility;Decreased knowledge of use of DME;Decreased knowledge of precautions;Decreased safety awareness;Impaired sensation PT Treatment Interventions: DME instruction;Gait training;Stair training;Functional mobility training;Therapeutic activities;Therapeutic exercise;Patient/family education;Wheelchair mobility training     PT Goals(Current goals can be found in the care plan section) Acute Rehab PT Goals Patient Stated Goal: I want to walk with crutches PT Goal Formulation: With patient Time For Goal Achievement: 10/21/13 Potential to Achieve Goals: Good  Visit Information  Last PT Received On: 10/07/13 Assistance Needed: +2 History of Present Illness: Pt admitted 09/26/13 with infection of L foot. Pt has H/O DM. Pt has had several  surgical I&D's, most recent  for L 3rd toe amputaion, wound closures, skin graft and wound VAC. for wound closure       Prior Functioning  Home Living Family/patient expects to be discharged to:: Inpatient rehab Living Arrangements: Spouse/significant other Available Help at Discharge: Family Type of Home: House Home Access: Stairs to enter  Entrance Stairs-Number of Steps: 1 Entrance  Stairs-Rails: None Home Layout: One level Home Equipment: None Prior Function Level of Independence: Independent Communication Communication: No difficulties    Cognition  Cognition Arousal/Alertness: Awake/alert Behavior During Therapy: WFL for tasks assessed/performed Overall Cognitive Status: Within Functional Limits for tasks assessed    Extremity/Trunk Assessment Upper Extremity Assessment Upper Extremity Assessment: Generalized weakness Lower Extremity Assessment Lower Extremity Assessment: Generalized weakness Cervical / Trunk Assessment Cervical / Trunk Assessment: Normal   Balance Balance Overall balance assessment: Needs assistance Sitting-balance support: Bilateral upper extremity supported;Feet supported Sitting balance-Leahy Scale: Fair Standing balance support: Bilateral upper extremity supported Standing balance-Leahy Scale: Fair Standing balance comment: at RW  End of Session PT - End of Session Equipment Utilized During Treatment: Gait belt Activity Tolerance: Patient limited by fatigue Patient left: in chair;with call bell/phone within reach Nurse Communication: Mobility status  GP     Claretha Cooper 10/07/2013, 12:11 PM

## 2013-10-07 NOTE — Progress Notes (Signed)
Patient ID: Mark [name], male   DOB: July 14, 1951, 63 y.o.   MRN: [id number]  [facility] PROGRESS NOTE  [name] [id number] DOB: [birth year] DOA: 09/26/2013 PCP: No PCP Per Patient  Brief narrative:  63 y.o. male with a past medical history of poorly controlled type 2 diabetes mellitus, medication nonadherence, presents as a direct admit from the urgent care Center. He complained of a two-week history of left foot pain, swelling, erythema which has significantly worsened in the last 2-3 days prior to this admission. He reported associated subjective fevers, chills, malaise. He was found to be febrile at the urgent care center having a temperature 102.7 with lab work showed a white count of 26.8 and a glucose of 359. Patient admitted for sepsis due to cellulitis and abscess of left foot.   Assessment/Plan:  Sepsis due to Cellulitis of foot with abscess and concern for necrotizing fascitis  - admission Xray of foot concerning for necrotizing fascitis, an MRI c/w cellulitis and small abscesses, concern for necrotizing fascitis.  - ortho following, pt is status post I&D (done 01/29), hydrotherapy started on 1/30. 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  - continue analgesia and broad spectrum ABX, Vancomycin day #9, Zosyn day #5, will ask ID for assistance with ABX management   Low grade fever - Tmax 99.5 F over the past 24 hours - possibly related to principal problem and post op - pt is on broad spectrum ABX already as noted above  - monitor vitals   Uncontrolled DM with complications of neuropathy and anemia of chronic disease  - A1C of 13.7 . reports not on any meds due to inability to afford. CM consulted. Will need insulin upon d/c.  - continue Lantus  - Emphasize on medication compliance.  Leukocytosis  - secondary to cellulitis and now post op demargination and stress reaction  - continue ABX as noted below and repeat CBC in AM  Anemia of chronic disease  - drop in Hg over the past 24 hours, no signs of active bleeding  - CBC in AM  Hypertension  - continue lisinopril and prn hydralazine  - reasonable BP control inpatient  Tobacco abuse  - counseled on cessation  Hypokalemia  - WNL this AM Malnutrition  - Added supplements   Code Status: full  Family Communication: Pt at bedside  Disposition Plan: skilled nursing facility when medically ready   Consultants:  Ortho  Procedures:  02/03 --> Repeat irrigation and debridement of left foot including removal of necrotic skin, soft tissue, fascia, and muscle. 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 SURGEON: Dr. Lind Guest. Blackman Antibiotics:  Vancomycin 01/31 --> Change Maxipime to Zosyn 02/04 -->  Clindamycin 02/05 -->   HPI/Subjective: No events overnight.   Objective: Filed Vitals:   10/06/13 1127 10/06/13 1349 10/06/13 2133 10/07/13 0555  BP: 118/68 111/62 138/78 131/73  Pulse:  84 91 89  Temp:  99.4 F (37.4 C) 99.7 F (37.6 C) 99.5 F (37.5 C)  TempSrc:  Oral Oral Oral  Resp:  18 18 18   Height:      Weight:      SpO2:  94% 92% 92%    Intake/Output Summary (Last 24 hours) at 10/07/13 1122 Last data filed at 10/07/13 [id number]  Gross per 24 hour  Intake    650 ml  Output   3850 ml  Net  -3200 ml    Exam:   General:  Pt is alert, follows commands appropriately, not in acute distress  Cardiovascular: Regular rate and rhythm, S1/S2, no murmurs, no rubs, no gallops  Respiratory: Clear to auscultation bilaterally, no  wheezing, no crackles, no rhonchi  Abdomen: Soft, non tender, non distended, bowel sounds present, no guarding  Data Reviewed: Basic Metabolic Panel:  Recent Labs Lab 10/02/13 0929 10/04/13 0510 10/05/13 0455 10/06/13 0520 10/07/13 0540  NA  --  137 138 139 140  K  --  4.1 4.0 4.1 4.0  CL  --  100 100 100 100  CO2  --  29 30 31 31   GLUCOSE  --  166* 77 137* 149*  BUN  --  12 11 10 10   CREATININE 0.76 0.87 0.89 1.05 1.20  CALCIUM  --  8.2* 8.4 8.3* 8.3*   CBC:  Recent Labs Lab 10/01/13 0505  10/03/13 0520 10/04/13 0510 10/05/13 0455 10/06/13 0520 10/07/13 0540  WBC 16.8*  < > 20.1* 16.1* 13.4* 15.6* 15.9*  NEUTROABS 12.6*  --   --   --   --   --   --   HGB 10.1*  < > 11.0* 9.5* 10.0* 9.6* 9.5*  HCT 30.7*  < > 33.2* 28.8* 29.9* 29.7* 29.6*  MCV 91.4  < > 92.7 92.6 91.7 93.7 93.7  PLT 653*  < > 742* 789* 853* 871* 950*  < > = values in this interval not displayed.  CBG:  Recent Labs Lab 10/06/13 2130 10/07/13 0250 10/07/13 0316 10/07/13 0350 10/07/13 0725  GLUCAP 104* 70 67* 102* 139*    Recent Results (from the past 240 hour(s))  SURGICAL PCR SCREEN     Status: Abnormal   Collection Time    09/27/13  4:17 PM      Result Value Range Status   MRSA, PCR NEGATIVE  NEGATIVE Final   Staphylococcus aureus POSITIVE (*) NEGATIVE Final   Comment:            The Xpert SA Assay (FDA     approved for NASAL specimens     in patients over 57 years of age),     is one component of     a comprehensive surveillance     program.  Test performance has     been validated by [employer] for patients greater     than or equal to 47 year old.     It is not intended     to diagnose infection nor to     guide or monitor treatment.  CULTURE, ROUTINE-ABSCESS     Status: None   Collection Time    09/27/13  6:05 PM      Result Value Range Status   Specimen Description ABSCESS LEFT FOOT   Final   Special Requests NONE   Final   Gram Stain     Final   Value: NO WBC  SEEN     NO ORGANISMS SEEN     Performed by [facility] V RN AT 2031 ON 09/27/13 BY HAMER N  Performed at [facility]     Final   Value: MODERATE STAPHYLOCOCCUS SPECIES (COAGULASE NEGATIVE)     Performed at [facility]   Report Status 09/30/2013 FINAL   Final  ANAEROBIC CULTURE     Status: None   Collection Time    09/27/13  6:05 PM      Result Value Range Status   Specimen Description ABSCESS LEFT FOOT   Final   Special Requests NONE   Final   Gram Stain     Final   Value: NO WBC SEEN     NO ORGANISMS SEEN     Performed by [facility] V RN 2031 ON 09/27/13 BY HAMER N     Performed at [facility]   Culture     Final   Value: NO ANAEROBES ISOLATED     Performed at [facility]   Report Status 10/02/2013 FINAL   Final  GRAM STAIN     Status: None   Collection Time    09/27/13  6:05 PM      Result Value Range Status   Specimen Description ABSCESS   Final   Special Requests LEFT FOOT   Final   Gram Stain     Final   Value: NO WBC SEEN     NO ORGANISMS SEEN     Gram Stain Report Called to,Read Back By and Verified With: [facility] RN 2031 [id number] HAMER,N   Report Status 09/27/2013 FINAL   Final  CLOSTRIDIUM DIFFICILE BY PCR     Status: None   Collection Time    10/02/13  8:43 AM      Result Value Range Status   C difficile by pcr NEGATIVE  NEGATIVE Final   Comment: Performed at [facility]     Scheduled Meds: . clindamycin  300 mg Oral Q6H  . enoxaparin (LOVENOX) injection  40 mg Subcutaneous Q24H  . ferrous gluconate  324 mg Oral BID WC  . influenza vac split quadrivalent PF  0.5 mL Intramuscular Tomorrow-1000  . insulin aspart  0-15 Units Subcutaneous TID WC  . insulin aspart  0-5 Units Subcutaneous QHS  . insulin aspart  3 Units Subcutaneous TID WC  . insulin glargine  40 Units Subcutaneous QHS  . lisinopril  20 mg Oral Daily  . piperacillin-tazobactam (ZOSYN)  IV  3.375  g Intravenous Q8H  . vancomycin  1,500 mg Intravenous Q12H   Continuous Infusions:    Faye Ramsay, MD  [facility] Pager [phone]  If 7PM-7AM, please contact night-coverage [email or web address] Password [email or web address] 10/07/2013, 11:22 AM   LOS: 11 days

## 2013-10-07 NOTE — Progress Notes (Signed)
ANTIBIOTIC CONSULT NOTE - FOLLOW UP  Pharmacy Consult for vancomycin, Zosyn Indication: sepsis secondary to cellulitis of foot with abscess and concern for necrotizing fasciitis  No Known Allergies  Patient Measurements: Height: 6\' 1"  (185.4 cm) Weight: 206 lb (93.441 kg) IBW/kg (Calculated) : 79.9  Vital Signs: Temp: 99.5 F (37.5 C) (02/08 0555) Temp src: Oral (02/08 0555) BP: 131/73 mmHg (02/08 0555) Pulse Rate: 89 (02/08 0555) Intake/Output from previous day: 02/07 0701 - 02/08 0700 In: 1030 [P.O.:480; IV Piggyback:550] Out: 3250 [Urine:3250] Intake/Output from this shift: Total I/O In: 120 [P.O.:120] Out: 600 [Urine:600]  Labs:  Recent Labs  10/05/13 0455 10/06/13 0520 10/07/13 0540  WBC 13.4* 15.6* 15.9*  HGB 10.0* 9.6* 9.5*  PLT 853* 871* 950*  CREATININE 0.89 1.05 1.20   Estimated Creatinine Clearance: 72.1 ml/min (by C-G formula based on Cr of 1.2).   Assessment: 52 yoM with PMH of poorly controlled T2DM, medication nonadherence, presented 1/28 as a direct admit from the urgent care center. He complained of a two-week history of left foot pain, swelling, erythema which has significantly worsened. Pt with evidence of sepsis seondary cellulitis, empiric antibiotics started. Xray of foot showed foreign body between 2nd and 3rd toe and large amount of gas tracking along the foot concerning for necrotizing fascitis. An MRI of the foot was done which again showed cellulitis with multiple small abscesses and concern for necrotizing fascitis. S/p I&D on 1/29, 2/3 and 2/6 with amputation of necrtoci 3rd toe on 2/3 and placement of split thickness skin graft 2/6  1/28 >> Zosyn >> 1/29 1/29 >> vancomycin >>  1/29 >> cefepime >> 2/4 2/4 >>   Zosyn >> 2/5 >>   Clindamycin >>  Cultures: 1/28 blood >> No growth, FINAL  1/29 abscess >> coagulase-negative Staph 2/2 C.diff PCR >> negative    Dose changes/drug level info:  1/31 Vancomycin trough = 12.7 on 1250mg  q12h,  increased to 1500 mg q12h 2/3   Vancomycin trough = 16.2 on 1500 mg q12h  Now on D#11 vancomycin (1500mg  q12h), D#5 Zosyn (3.375 grams IV q8h, each dose over 4 hours), D#4 clindamycin 300 mg PO q6h.     WBC holding steady at 15  SCr increasing last 2 days   Goal of Therapy:  Vancomycin trough level 15-20 mcg/ml Appropriate dosing of antibiotics; eradication of infection  Plan:  1) Continue vancomycin 1500mg  q12 for now 2) Will, however, check another vancomycin trough due to increasing SCr over last couple of days to evaluate for possible accumulation 3) Continue current Zosyn dosing   Adrian Saran, PharmD, BCPS Pager [phone] 10/07/2013 10:43 AM

## 2013-10-07 NOTE — Progress Notes (Signed)
Pt stable dressing dry - mobilizing more daily - pt has set own goals

## 2013-10-08 ENCOUNTER — Encounter (HOSPITAL_COMMUNITY): Payer: Self-pay | Admitting: Orthopaedic Surgery

## 2013-10-08 LAB — CBC
HCT: 29.4 % — ABNORMAL LOW (ref 39.0–52.0)
Hemoglobin: 9.4 g/dL — ABNORMAL LOW (ref 13.0–17.0)
MCH: 29.9 pg (ref 26.0–34.0)
MCHC: 32 g/dL (ref 30.0–36.0)
MCV: 93.6 fL (ref 78.0–100.0)
Platelets: 924 10*3/uL (ref 150–400)
RBC: 3.14 MIL/uL — ABNORMAL LOW (ref 4.22–5.81)
RDW: 13.4 % (ref 11.5–15.5)
WBC: 11.8 10*3/uL — ABNORMAL HIGH (ref 4.0–10.5)

## 2013-10-08 LAB — BASIC METABOLIC PANEL
BUN: 12 mg/dL (ref 6–23)
CO2: 31 mEq/L (ref 19–32)
Calcium: 8.2 mg/dL — ABNORMAL LOW (ref 8.4–10.5)
Chloride: 101 mEq/L (ref 96–112)
Creatinine, Ser: 1.21 mg/dL (ref 0.50–1.35)
GFR calc Af Amer: 72 mL/min — ABNORMAL LOW (ref >90)
GFR calc non Af Amer: 62 mL/min — ABNORMAL LOW (ref >90)
Glucose, Bld: 110 mg/dL — ABNORMAL HIGH (ref 70–99)
Potassium: 3.9 mEq/L (ref 3.7–5.3)
Sodium: 139 mEq/L (ref 137–147)

## 2013-10-08 LAB — GLUCOSE, CAPILLARY
Glucose-Capillary: 68 mg/dL — ABNORMAL LOW (ref 70–99)
Glucose-Capillary: 157 mg/dL — ABNORMAL HIGH (ref 70–99)
Glucose-Capillary: 148 mg/dL — ABNORMAL HIGH (ref 70–99)
Glucose-Capillary: 200 mg/dL — ABNORMAL HIGH (ref 70–99)

## 2013-10-08 LAB — PATHOLOGIST SMEAR REVIEW

## 2013-10-08 MED ORDER — VANCOMYCIN HCL 10 G IV SOLR
1250.0000 mg | Freq: Two times a day (BID) | INTRAVENOUS | Status: DC
  Administered 2013-10-08 – 2013-10-09 (×3): 1250 mg via INTRAVENOUS
  Filled 2013-10-08 (×4): qty 1250

## 2013-10-08 NOTE — Progress Notes (Signed)
Dressing changed to Lt. Thigh as ordered. Old ,dry bloody drainage noted on 4x4 over xeroform. Unable to remove without removing the xeroform and would have to moisten with normal saline to remove both, so left on. Sterile 4x4 applied over the old 4x4, ABD applied, then kerlex and ace bandage. Lt. Elevated on 2 pillows. Pt able to lift lt. Leg off the bed. Both dressing remain c/d.

## 2013-10-08 NOTE — Discharge Instructions (Signed)
No weight on your left foot until further notice. Do not get your foot wet. New xeroform and dry dressing daily over your left foot wounds and skin graft. Keep blood sugars under good control. No smoking. Keep xeroform in place over left thigh wound.

## 2013-10-08 NOTE — Op Note (Signed)
NAMEWILFORD, STRAYER NO.:  [id number]  MEDICAL RECORD NO.:  [id number]  LOCATION:                                 FACILITY:  PHYSICIAN:  Lind Guest. Ninfa Linden, M.D.DATE OF BIRTH:  [birth year]  DATE OF PROCEDURE:  10/05/2013 DATE OF DISCHARGE:                              OPERATIVE REPORT   PREOPERATIVE DIAGNOSIS:  Left foot wound status post multiple irrigation, debridements, and hydrotherapy status post overwhelming infection with necrotizing fasciitis.  POSTOPERATIVE DIAGNOSIS:  Left foot wound status post multiple irrigation, debridements, and hydrotherapy status post overwhelming infection with necrotizing fasciitis.  PROCEDURE: 1. Repeat irrigation and debridement of left foot with removal of     necrotic skin, soft tissue, fascia, and muscle. 2. Placement of split-thickness skin graft measuring 5 cm x 7 cm to     left foot wound from left thigh donor site. 3. Placement of decubitus vac sponge over split-thickness skin graft,     left foot.  SURGEON:  Lind Guest. Ninfa Linden, M.D.  ASSISTANT:  Erskine Emery, P.A.  ANESTHESIA:  General.  BLOOD LOSS:  Minimal.  COMPLICATIONS:  None.  INDICATIONS:  Mark [name] is a 63 year old gentleman who came into the hospital just over a week ago with an overwhelming infection involving his left foot.  He is a poorly-controlled diabetic as well and has a white blood cell count in the 37,000 range as well as very poorly- controlled diabetes.  He had area in the soft tissue of his foot suspicious for necrotizing fasciitis.  He was taken to the operating room, and multiple incisions were made around this foot dorsally and on the plantar aspect and we found gross infection all tracking to the foot consistent with necrotizing fasciitis.  We had to remove a large amount of skin on the arch of his foot and had hydrotherapy performed multiple times as well as another repeat irrigation, debridement in the  operative setting earlier this week.  He is now presenting having stabilized the infection and his white blood cell count has started to normalize, his blood glucose is under better control and I felt that this time is appropriate to try to salvage the foot and place a skin graft.  I have talked to him and his wife about this in detail.  He still has intact bony structure of his foot and we will have nonweightbearing, but he prefer this alternative prior to recommending a below-knee amputation.  PROCEDURE DESCRIPTION:  After informed consent was obtained, appropriate left foot was marked.  He was brought to the operating room, placed supine on the operative table.  General anesthesia was then obtained.  A bump was placed under his left hip to internally rotate the leg.  His leg was prepped and draped from the upper thigh down the foot with Hibiclens and sterile drapes.  Time-out was called and he was identified as correct patient, correct left foot.  We then used pulsatile lavage over the medial foot wound that measured 5 cm x 7 cm, it was also about 1 cm deep.  After we had lavaged the wound with 3 L normal saline solution, we used scissors, knife, and rongeur to remove skin,  soft tissue, fascia, and muscle that was necrotic.  Once we got a good bleeding base of tissue, we decided to proceed with a skin graft.  We then placed mineral oil on the left thigh and then used a dermatome with a 3-inch blade set at 0.14 mm thickness and I was able to harvest nicer skin graft from the thigh.  We placed an epinephrine-soaked sponge on the thigh wound.  We then took the split-thickness skin graft and ran it to a 1 to 1-1/2 mesher.  This then allowed [place] to place the skin graft over the plantar medial foot wound.  We then sewed this into place with interrupted 4-0 Monocryl sutures.  Once this was done, we placed Adaptic over the skin graft and then a VAC sponge.  We set this to suction and got a  good seal.  Following this, we took the epinephrine-soaked sponge off the donor site on the left thigh, placed Xeroform and a well-padded sterile dressing.  He was then awakened, extubated, and taken to the recovery room in stable condition.  All final counts were correct. There were no complications noted.  Of note, Carney Bern, PA-C assisted during the entire case and his assistance was crucial in harvesting and skin graft to help fix this.     Lind Guest. Ninfa Linden, M.D.     CYB/MEDQ  D:  10/05/2013  T:  10/06/2013  Job:  [id number]

## 2013-10-08 NOTE — Progress Notes (Signed)
Patient ID: Mark [name], male   DOB: 03/10/1951, 63 y.o.   MRN: [id number] [facility] PROGRESS NOTE  LEKEITH SHUTE [id number] DOB: 01-20-1951 DOA: 09/26/2013 PCP: No PCP Per Patient  Brief narrative:  63 y.o. male with a past medical history of poorly controlled type 2 diabetes mellitus, medication nonadherence, presents as a direct admit from the urgent care Center. He complained of a two-week history of left foot pain, swelling, erythema which has significantly worsened in the last 2-3 days prior to this admission. He reported associated subjective fevers, chills, malaise. He was found to be febrile at the urgent care center having a temperature 102.7 with lab work showed a white count of 26.8 and a glucose of 359. Patient admitted for sepsis due to cellulitis and abscess of left foot.   Assessment/Plan:  Sepsis due to Cellulitis of foot with abscess and concern for necrotizing fascitis  - admission Xray of foot concerning for necrotizing fascitis, an MRI c/w cellulitis and small abscesses, concern for necrotizing fascitis.  - ortho following, pt is status post I&D (done 01/29), hydrotherapy started on 1/30. Blood and abscess culture final report pending  - pt is s/p repeat I&D of left foot including removal of necrotic skin, soft tissue, fascia, and muscle, amputation of left foot third toe through proximal metatarsal (02/03)  - status post irrigation and debridement left foot, split thickness skin graft (10/05/2013)  - VAC removed 02/09 and wound looks good per ortho, plan on d/c to inpatient rehab, placement pending  - non-weight bearing on his left foot for the next 4 weeks.  - continue analgesia and broad spectrum ABX, Vancomycin day #10, Zosyn day #6 - will change to oral doxycycline on discharge  Low grade fever  - afebrile over 24 hours  - possibly related to principal problem and post op  - now resolved  Uncontrolled DM with complications of neuropathy and anemia of  chronic disease  - A1C of 13.7 . reports not on any meds due to inability to afford. CM consulted. Will need insulin upon d/c.  - continue Lantus  - Emphasize on medication compliance.  Leukocytosis  - secondary to cellulitis and now post op demargination and stress reaction  - continue ABX as noted below - WBC trending down  Anemia of chronic disease  - no signs of active bleeding, overall stable   - CBC in AM  Hypertension  - continue lisinopril and prn hydralazine  - reasonable BP control inpatient  Tobacco abuse  - counseled on cessation  Hypokalemia  - WNL this AM  Malnutrition  - Added supplements   Code Status: full  Family Communication: Pt at bedside  Disposition Plan: CIR requested   Consultants:  Ortho  Procedures:  02/03 --> Repeat irrigation and debridement of left foot including removal of necrotic skin, soft tissue, fascia, and muscle. 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 SURGEON: Dr. Lind Guest. Blackman Antibiotics:  Vancomycin 01/31 -->  Change Maxipime to Zosyn 02/04 -->  Clindamycin 02/05 -->   HPI/Subjective: No events overnight.   Objective: Filed Vitals:   10/07/13 1500 10/07/13 2143 10/08/13 0602 10/08/13 0959  BP: 138/78 121/68 145/81 145/81  Pulse: 71 96 77   Temp: 99.2 F (37.3 C) 99.2 F (37.3 C) 99.2 F (37.3 C)   TempSrc: Oral Oral Oral   Resp: 18 18 18    Height:      Weight:      SpO2: 97% 93% 94%  Intake/Output Summary (Last 24 hours) at 10/08/13 1410 Last data filed at 10/08/13 1250  Gross per 24 hour  Intake   1710 ml  Output   1440 ml  Net    270 ml    Exam:   General:  Pt is alert, follows commands appropriately, not in acute distress  Cardiovascular: Regular rate and rhythm, S1/S2, no murmurs, no rubs, no gallops  Respiratory: Clear to auscultation bilaterally, no wheezing, no crackles, no rhonchi  Abdomen: Soft, non tender, non distended, bowel sounds present, no  guarding   Data Reviewed: Basic Metabolic Panel:  Recent Labs Lab 10/04/13 0510 10/05/13 0455 10/06/13 0520 10/07/13 0540 10/08/13 0427  NA 137 138 139 140 139  K 4.1 4.0 4.1 4.0 3.9  CL 100 100 100 100 101  CO2 29 30 31 31 31   GLUCOSE 166* 77 137* 149* 110*  BUN 12 11 10 10 12   CREATININE 0.87 0.89 1.05 1.20 1.21  CALCIUM 8.2* 8.4 8.3* 8.3* 8.2*   CBC:  Recent Labs Lab 10/04/13 0510 10/05/13 0455 10/06/13 0520 10/07/13 0540 10/08/13 0427  WBC 16.1* 13.4* 15.6* 15.9* 11.8*  HGB 9.5* 10.0* 9.6* 9.5* 9.4*  HCT 28.8* 29.9* 29.7* 29.6* 29.4*  MCV 92.6 91.7 93.7 93.7 93.6  PLT 789* 853* 871* 950* 924*   CBG:  Recent Labs Lab 10/07/13 1152 10/07/13 1620 10/07/13 2140 10/08/13 0724 10/08/13 1140  GLUCAP 85 128* 145* 68* 157*    Recent Results (from the past 240 hour(s))  CLOSTRIDIUM DIFFICILE BY PCR     Status: None   Collection Time    10/02/13  8:43 AM      Result Value Range Status   C difficile by pcr NEGATIVE  NEGATIVE Final   Comment: Performed at [facility]     Scheduled Meds: . clindamycin  300 mg Oral Q6H  . enoxaparin (LOVENOX) injection  40 mg Subcutaneous Q24H  . ferrous gluconate  324 mg Oral BID WC  . insulin aspart  0-15 Units Subcutaneous TID WC  . insulin aspart  0-5 Units Subcutaneous QHS  . insulin aspart  3 Units Subcutaneous TID WC  . lisinopril  20 mg Oral Daily  . piperacillin-tazobactam (ZOSYN)  IV  3.375 g Intravenous Q8H  . vancomycin  1,250 mg Intravenous Q12H   Continuous Infusions:    Faye Ramsay, MD  [facility] Pager [phone]  If 7PM-7AM, please contact night-coverage [email or web address] Password [email or web address] 10/08/2013, 2:10 PM   LOS: 12 days

## 2013-10-08 NOTE — Progress Notes (Signed)
Physical Therapy Treatment Patient Details Name: MUKUL KRAHMER MRN: [id number] DOB: 1950/09/15 Today's Date: 10/08/2013 Time: [id number] PT Time Calculation (min): 30 min  PT Assessment / Plan / Recommendation  History of Present Illness Pt admitted 09/26/13 with infection of L foot. Pt has H/O DM. Pt has had several  surgical I&D's, most recent  for L 3rd toe amputaion, wound closures, skin graft and wound VAC. for wound closure   PT Comments   Assisted pt OOB to amb in hallway.  Follow Up Recommendations  CIR     Does the patient have the potential to tolerate intense rehabilitation     Barriers to Discharge        Equipment Recommendations  Rolling walker with 5" wheels;Wheelchair (measurements PT)    Recommendations for Other Services    Frequency Min 6X/week   Progress towards PT Goals Progress towards PT goals: Progressing toward goals  Plan      Precautions / Restrictions Precautions Precautions: Fall Precaution Comments: wound VAC Restrictions Weight Bearing Restrictions: Yes LLE Weight Bearing: Non weight bearing    Pertinent Vitals/Pain No c/o pain    Mobility  Bed Mobility Overal bed mobility: Needs Assistance Bed Mobility: Supine to Sit Supine to sit: Min assist General bed mobility comments: extra  time  for mobilizing to edge of bed, difficulty getting scooted to edge, noted edema of thighs and scrotum. Transfers Overall transfer level: Needs assistance Equipment used: Rolling walker (2 wheeled) Transfers: Sit to/from Stand Sit to Stand: Mod assist;+2 safety/equipment;From elevated surface General transfer comment: cues fot UE use. safe NWB on LLE Ambulation/Gait Ambulation/Gait assistance: +2 safety/equipment;+2 physical assistance Ambulation Distance (Feet): 45 Feet Assistive device: Rolling walker (2 wheeled) Gait Pattern/deviations: Step-to pattern General Gait Details: extra time rquired due to pt's deconditioned state. Pt stopped and stood to  rest UE's. Pt was able to maintain NWB during ambulation     PT Goals (current goals can now be found in the care plan section)    Visit Information  Last PT Received On: 10/08/13 History of Present Illness: Pt admitted 09/26/13 with infection of L foot. Pt has H/O DM. Pt has had several  surgical I&D's, most recent  for L 3rd toe amputaion, wound closures, skin graft and wound VAC. for wound closure    Subjective Data      Cognition       Balance     End of Session PT - End of Session Equipment Utilized During Treatment: Gait belt Activity Tolerance: Patient limited by fatigue Patient left: in chair;with call bell/phone within reach Nurse Communication: Mobility status   Rica Koyanagi  PTA [facility]  Acute  Rehab Pager      [phone]

## 2013-10-08 NOTE — Progress Notes (Signed)
ANTIBIOTIC CONSULT NOTE - FOLLOW UP  Pharmacy Consult for Vancomycin Indication: sepsis secondary to cellulitis of foot with abscess and concern for necrotizing fasciitis   No Known Allergies  Patient Measurements: Height: 6\' 1"  (185.4 cm) Weight: 206 lb (93.441 kg) IBW/kg (Calculated) : 79.9 Adjusted Body Weight:   Vital Signs: Temp: 99.2 F (37.3 C) (02/08 2143) Temp src: Oral (02/08 2143) BP: 121/68 mmHg (02/08 2143) Pulse Rate: 96 (02/08 2143) Intake/Output from previous day: 02/08 0701 - 02/09 0700 In: 600 [P.O.:600] Out: 1350 [Urine:1350] Intake/Output from this shift:    Labs:  Recent Labs  10/05/13 0455 10/06/13 0520 10/07/13 0540  WBC 13.4* 15.6* 15.9*  HGB 10.0* 9.6* 9.5*  PLT 853* 871* 950*  CREATININE 0.89 1.05 1.20   Estimated Creatinine Clearance: 72.1 ml/min (by C-G formula based on Cr of 1.2).  Recent Labs  10/07/13 2252  VANCOTROUGH 22.2*     Microbiology: Recent Results (from the past 720 hour(s))  CULTURE, BLOOD (ROUTINE X 2)     Status: None   Collection Time    09/26/13 11:15 PM      Result Value Range Status   Specimen Description BLOOD RIGHT ANTECUBITAL   Final   Special Requests BOTTLES DRAWN AEROBIC AND ANAEROBIC 5CC   Final   Culture  Setup Time     Final   Value: 09/27/2013 03:35     Performed at [facility]   Culture     Final   Value: NO GROWTH 5 DAYS     Performed at [facility]   Report Status 10/03/2013 FINAL   Final  CULTURE, BLOOD (ROUTINE X 2)     Status: None   Collection Time    09/26/13 11:20 PM      Result Value Range Status   Specimen Description BLOOD RIGHT HAND   Final   Special Requests BOTTLES DRAWN AEROBIC AND ANAEROBIC 5CC   Final   Culture  Setup Time     Final   Value: 09/27/2013 03:34     Performed at [facility]   Culture     Final   Value: NO GROWTH 5 DAYS     Performed at [facility]   Report Status 10/03/2013 FINAL   Final  SURGICAL PCR SCREEN     Status:  Abnormal   Collection Time    09/27/13  4:17 PM      Result Value Range Status   MRSA, PCR NEGATIVE  NEGATIVE Final   Staphylococcus aureus POSITIVE (*) NEGATIVE Final   Comment:            The Xpert SA Assay (FDA     approved for NASAL specimens     in patients over 10 years of age),     is one component of     a comprehensive surveillance     program.  Test performance has     been validated by [employer] for patients greater     than or equal to 28 year old.     It is not intended     to diagnose infection nor to     guide or monitor treatment.  CULTURE, ROUTINE-ABSCESS     Status: None   Collection Time    09/27/13  6:05 PM      Result Value Range Status   Specimen Description ABSCESS LEFT FOOT   Final   Special Requests NONE   Final   Gram  Stain     Final   Value: NO WBC SEEN     NO ORGANISMS SEEN     Performed by [facility] V RN AT 2031 ON 09/27/13 BY HAMER N     Performed at [facility]   Culture     Final   Value: MODERATE STAPHYLOCOCCUS SPECIES (COAGULASE NEGATIVE)     Performed at [facility]   Report Status 09/30/2013 FINAL   Final  ANAEROBIC CULTURE     Status: None   Collection Time    09/27/13  6:05 PM      Result Value Range Status   Specimen Description ABSCESS LEFT FOOT   Final   Special Requests NONE   Final   Gram Stain     Final   Value: NO WBC SEEN     NO ORGANISMS SEEN     Performed by [facility] V RN 2031 ON 09/27/13 BY HAMER N     Performed at [facility]   Culture     Final   Value: NO ANAEROBES ISOLATED     Performed at [facility]   Report Status 10/02/2013 FINAL   Final  GRAM STAIN     Status: None   Collection Time    09/27/13  6:05 PM      Result Value Range Status   Specimen Description ABSCESS   Final   Special Requests LEFT FOOT   Final   Gram Stain     Final   Value: NO WBC SEEN     NO ORGANISMS SEEN     Gram Stain Report Called to,Read Back  By and Verified With: [facility] RN 2031 [id number] HAMER,N   Report Status 09/27/2013 FINAL   Final  CLOSTRIDIUM DIFFICILE BY PCR     Status: None   Collection Time    10/02/13  8:43 AM      Result Value Range Status   C difficile by pcr NEGATIVE  NEGATIVE Final   Comment: Performed at [place]   Start     Dose/Rate Route Frequency Ordered Stop   10/08/13 0600  vancomycin (VANCOCIN) 1,250 mg in sodium chloride 0.9 % 250 mL IVPB     1,250 mg 166.7 mL/hr over 90 Minutes Intravenous Every 12 hours 10/08/13 0242     10/04/13 1600  clindamycin (CLEOCIN) capsule 300 mg     300 mg Oral 4 times per day 10/04/13 1433     10/03/13 1000  piperacillin-tazobactam (ZOSYN) IVPB 3.375 g     3.375 g 12.5 mL/hr over 240 Minutes Intravenous Every 8 hours 10/03/13 0929     09/29/13 2200  vancomycin (VANCOCIN) 1,500 mg in sodium chloride 0.9 % 500 mL IVPB  Status:  Discontinued     1,500 mg 250 mL/hr over 120 Minutes Intravenous Every 12 hours 09/29/13 1344 10/07/13 2334   09/27/13 1000  ceFEPIme (MAXIPIME) 2 g in dextrose 5 % 50 mL IVPB  Status:  Discontinued     2 g 100 mL/hr over 30 Minutes Intravenous Every 8 hours 09/27/13 0929 10/03/13 0928   09/27/13 0015  vancomycin (VANCOCIN) 1,250 mg in sodium chloride 0.9 % 250 mL IVPB  Status:  Discontinued     1,250 mg 166.7 mL/hr over 90 Minutes Intravenous Every 12 hours 09/27/13 0001 09/29/13 1344   09/26/13 2359  piperacillin-tazobactam (ZOSYN) IVPB 3.375 g  Status:  Discontinued  3.375 g 12.5 mL/hr over 240 Minutes Intravenous Every 8 hours 09/26/13 2252 09/27/13 0854      Assessment: Patient with vancomycin level above goal.    Goal of Therapy:  Vancomycin trough level 15-20 mcg/ml  Plan:  Measure antibiotic drug levels at steady state Follow up culture results Change vancomycin to 1250mg  iv q12hr, next dose at [place], Shea [facility] [place] 10/08/2013,2:48 AM

## 2013-10-08 NOTE — Progress Notes (Signed)
Patient ID: Mark [name], male   DOB: 04-Aug-1951, 63 y.o.   MRN: [id number] WBC continues to come down.  Skin graft looks good left foot.  VAC removed and new dressing placed.  Can now go home from ortho standpoint on oral doxycycline for 2 weeks as well as daily dressing changes.  He will remain non-weight bearing on his left foot.

## 2013-10-08 NOTE — Progress Notes (Signed)
Rehab Admissions Coordinator Note:  Patient was screened by Ardelia Wrede L for appropriateness for an Inpatient Acute Rehab Consult.  At this time, we are recommending Inpatient Rehab consult.  Ercil Cassis L 10/08/2013, 9:15 AM  I can be reached at [phone].

## 2013-10-09 ENCOUNTER — Inpatient Hospital Stay (HOSPITAL_COMMUNITY)
Admission: RE | Admit: 2013-10-09 | Discharge: 2013-10-17 | DRG: 945 | Disposition: A | Discharge status: Home Health | Payer: Medicaid Other | Source: Intra-hospital | Attending: Physical Medicine & Rehabilitation | Admitting: Physical Medicine & Rehabilitation

## 2013-10-09 DIAGNOSIS — B49 Unspecified mycosis: Secondary | ICD-10-CM | POA: Diagnosis present

## 2013-10-09 DIAGNOSIS — L98499 Non-pressure chronic ulcer of skin of other sites with unspecified severity: Secondary | ICD-10-CM

## 2013-10-09 DIAGNOSIS — S98139A Complete traumatic amputation of one unspecified lesser toe, initial encounter: Secondary | ICD-10-CM

## 2013-10-09 DIAGNOSIS — M726 Necrotizing fasciitis: Secondary | ICD-10-CM | POA: Diagnosis present

## 2013-10-09 DIAGNOSIS — I10 Essential (primary) hypertension: Secondary | ICD-10-CM | POA: Diagnosis present

## 2013-10-09 DIAGNOSIS — F17200 Nicotine dependence, unspecified, uncomplicated: Secondary | ICD-10-CM | POA: Diagnosis present

## 2013-10-09 DIAGNOSIS — E1142 Type 2 diabetes mellitus with diabetic polyneuropathy: Secondary | ICD-10-CM

## 2013-10-09 DIAGNOSIS — E1149 Type 2 diabetes mellitus with other diabetic neurological complication: Secondary | ICD-10-CM | POA: Diagnosis present

## 2013-10-09 DIAGNOSIS — Z5189 Encounter for other specified aftercare: Principal | ICD-10-CM

## 2013-10-09 DIAGNOSIS — I739 Peripheral vascular disease, unspecified: Secondary | ICD-10-CM

## 2013-10-09 DIAGNOSIS — D649 Anemia, unspecified: Secondary | ICD-10-CM | POA: Diagnosis present

## 2013-10-09 LAB — GLUCOSE, CAPILLARY
Glucose-Capillary: 156 mg/dL — ABNORMAL HIGH (ref 70–99)
Glucose-Capillary: 176 mg/dL — ABNORMAL HIGH (ref 70–99)
Glucose-Capillary: 185 mg/dL — ABNORMAL HIGH (ref 70–99)
Glucose-Capillary: 168 mg/dL — ABNORMAL HIGH (ref 70–99)

## 2013-10-09 LAB — CBC
HCT: 28.7 % — ABNORMAL LOW (ref 39.0–52.0)
HCT: 28.5 % — ABNORMAL LOW (ref 39.0–52.0)
Hemoglobin: 9.3 g/dL — ABNORMAL LOW (ref 13.0–17.0)
Hemoglobin: 9.4 g/dL — ABNORMAL LOW (ref 13.0–17.0)
MCH: 30.2 pg (ref 26.0–34.0)
MCH: 30.4 pg (ref 26.0–34.0)
MCHC: 32.4 g/dL (ref 30.0–36.0)
MCHC: 33 g/dL (ref 30.0–36.0)
MCV: 93.2 fL (ref 78.0–100.0)
MCV: 92.2 fL (ref 78.0–100.0)
Platelets: 944 10*3/uL (ref 150–400)
Platelets: 869 10*3/uL — ABNORMAL HIGH (ref 150–400)
RBC: 3.08 MIL/uL — ABNORMAL LOW (ref 4.22–5.81)
RBC: 3.09 MIL/uL — ABNORMAL LOW (ref 4.22–5.81)
RDW: 13.5 % (ref 11.5–15.5)
RDW: 13.3 % (ref 11.5–15.5)
WBC: 10.3 10*3/uL (ref 4.0–10.5)
WBC: 9.8 10*3/uL (ref 4.0–10.5)

## 2013-10-09 LAB — BASIC METABOLIC PANEL
BUN: 14 mg/dL (ref 6–23)
CO2: 29 mEq/L (ref 19–32)
Calcium: 8.3 mg/dL — ABNORMAL LOW (ref 8.4–10.5)
Chloride: 100 mEq/L (ref 96–112)
Creatinine, Ser: 1.27 mg/dL (ref 0.50–1.35)
GFR calc Af Amer: 68 mL/min — ABNORMAL LOW (ref >90)
GFR calc non Af Amer: 59 mL/min — ABNORMAL LOW (ref >90)
Glucose, Bld: 198 mg/dL — ABNORMAL HIGH (ref 70–99)
Potassium: 4.1 mEq/L (ref 3.7–5.3)
Sodium: 137 mEq/L (ref 137–147)

## 2013-10-09 LAB — CREATININE, SERUM
Creatinine, Ser: 1.38 mg/dL — ABNORMAL HIGH (ref 0.50–1.35)
GFR calc Af Amer: 62 mL/min — ABNORMAL LOW (ref >90)
GFR calc non Af Amer: 53 mL/min — ABNORMAL LOW (ref >90)

## 2013-10-09 MED ORDER — GLUCERNA SHAKE PO LIQD
237.0000 mL | Freq: Three times a day (TID) | ORAL | Status: DC
  Administered 2013-10-09: 237 mL via ORAL
  Filled 2013-10-09 (×2): qty 237

## 2013-10-09 MED ORDER — ACETAMINOPHEN 650 MG RE SUPP: 650.0000 mg | Freq: Four times a day (QID) | RECTAL | Status: DC | PRN

## 2013-10-09 MED ORDER — OXYCODONE HCL 5 MG PO TABS
5.0000 mg | ORAL_TABLET | ORAL | Status: DC | PRN
  Administered 2013-10-09 – 2013-10-17 (×19): 5 mg via ORAL
  Filled 2013-10-09 (×20): qty 1

## 2013-10-09 MED ORDER — ACETAMINOPHEN 325 MG PO TABS
650.0000 mg | ORAL_TABLET | Freq: Four times a day (QID) | ORAL | Status: DC | PRN
  Administered 2013-10-09: 650 mg via ORAL
  Filled 2013-10-09 (×2): qty 2

## 2013-10-09 MED ORDER — INSULIN ASPART 100 UNIT/ML ~~LOC~~ SOLN
0.0000 [IU] | Freq: Three times a day (TID) | SUBCUTANEOUS | Status: DC
  Administered 2013-10-09 – 2013-10-11 (×6): 3 [IU] via SUBCUTANEOUS
  Administered 2013-10-11 – 2013-10-12 (×3): 2 [IU] via SUBCUTANEOUS
  Administered 2013-10-12 – 2013-10-14 (×6): 3 [IU] via SUBCUTANEOUS
  Administered 2013-10-14: 2 [IU] via SUBCUTANEOUS
  Administered 2013-10-15 – 2013-10-17 (×5): 5 [IU] via SUBCUTANEOUS

## 2013-10-09 MED ORDER — PNEUMOCOCCAL VAC POLYVALENT 25 MCG/0.5ML IJ INJ
0.5000 mL | INJECTION | INTRAMUSCULAR | Status: AC
Start: 2013-10-10 — End: 2013-10-10
  Administered 2013-10-10: 0.5 mL via INTRAMUSCULAR
  Filled 2013-10-09: qty 0.5

## 2013-10-09 MED ORDER — BACLOFEN 10 MG PO TABS: 10.0000 mg | ORAL_TABLET | Freq: Three times a day (TID) | ORAL | Status: DC | PRN

## 2013-10-09 MED ORDER — INSULIN ASPART 100 UNIT/ML ~~LOC~~ SOLN
3.0000 [IU] | Freq: Three times a day (TID) | SUBCUTANEOUS | Status: DC
  Administered 2013-10-09 – 2013-10-17 (×24): 3 [IU] via SUBCUTANEOUS

## 2013-10-09 MED ORDER — FERROUS GLUCONATE 324 (38 FE) MG PO TABS: 324.0000 mg | ORAL_TABLET | Freq: Two times a day (BID) | ORAL | Status: DC

## 2013-10-09 MED ORDER — GLUCERNA SHAKE PO LIQD
237.0000 mL | Freq: Three times a day (TID) | ORAL | Status: DC
  Administered 2013-10-09 – 2013-10-15 (×11): 237 mL via ORAL

## 2013-10-09 MED ORDER — INSULIN ASPART 100 UNIT/ML ~~LOC~~ SOLN: 3.0000 [IU] | Freq: Three times a day (TID) | SUBCUTANEOUS | Status: DC

## 2013-10-09 MED ORDER — DOXYCYCLINE HYCLATE 100 MG PO TABS
100.0000 mg | ORAL_TABLET | Freq: Two times a day (BID) | ORAL | Status: DC
  Administered 2013-10-09 – 2013-10-17 (×16): 100 mg via ORAL
  Filled 2013-10-09 (×18): qty 1

## 2013-10-09 MED ORDER — BISACODYL 10 MG RE SUPP: 10.0000 mg | Freq: Every day | RECTAL | Status: DC | PRN

## 2013-10-09 MED ORDER — ONDANSETRON HCL 4 MG PO TABS: 4.0000 mg | ORAL_TABLET | Freq: Four times a day (QID) | ORAL | Status: DC | PRN

## 2013-10-09 MED ORDER — LISINOPRIL 20 MG PO TABS: 20.0000 mg | ORAL_TABLET | Freq: Every day | ORAL | Status: DC

## 2013-10-09 MED ORDER — INSULIN GLARGINE 100 UNIT/ML ~~LOC~~ SOLN: 10.0000 [IU] | Freq: Every day | SUBCUTANEOUS | Status: DC

## 2013-10-09 MED ORDER — BACLOFEN 10 MG PO TABS
10.0000 mg | ORAL_TABLET | Freq: Three times a day (TID) | ORAL | Status: DC | PRN
  Administered 2013-10-16: 10 mg via ORAL
  Filled 2013-10-09 (×2): qty 1

## 2013-10-09 MED ORDER — ALUM & MAG HYDROXIDE-SIMETH 200-200-20 MG/5ML PO SUSP: 30.0000 mL | Freq: Four times a day (QID) | ORAL | Status: DC | PRN

## 2013-10-09 MED ORDER — INFLUENZA VAC SPLIT QUAD 0.5 ML IM SUSP
0.5000 mL | INTRAMUSCULAR | Status: AC
  Administered 2013-10-10: 0.5 mL via INTRAMUSCULAR
  Filled 2013-10-09: qty 0.5

## 2013-10-09 MED ORDER — SORBITOL 70 % SOLN: 30.0000 mL | Freq: Every day | Status: DC | PRN

## 2013-10-09 MED ORDER — ENOXAPARIN SODIUM 40 MG/0.4ML ~~LOC~~ SOLN
40.0000 mg | SUBCUTANEOUS | Status: DC
Start: 2013-10-09 — End: 2013-10-09

## 2013-10-09 MED ORDER — FERROUS GLUCONATE 324 (38 FE) MG PO TABS
324.0000 mg | ORAL_TABLET | Freq: Two times a day (BID) | ORAL | Status: DC
  Administered 2013-10-10 – 2013-10-17 (×15): 324 mg via ORAL
  Filled 2013-10-09 (×19): qty 1

## 2013-10-09 MED ORDER — LISINOPRIL 20 MG PO TABS
20.0000 mg | ORAL_TABLET | Freq: Every day | ORAL | Status: DC
  Administered 2013-10-10 – 2013-10-17 (×8): 20 mg via ORAL
  Filled 2013-10-09 (×9): qty 1

## 2013-10-09 MED ORDER — DOXYCYCLINE HYCLATE 100 MG PO TABS: 100.0000 mg | ORAL_TABLET | Freq: Two times a day (BID) | ORAL | Status: DC

## 2013-10-09 MED ORDER — OXYCODONE HCL 5 MG PO TABS: 5.0000 mg | ORAL_TABLET | ORAL | Status: DC | PRN

## 2013-10-09 MED ORDER — ENOXAPARIN SODIUM 40 MG/0.4ML ~~LOC~~ SOLN
40.0000 mg | SUBCUTANEOUS | Status: DC
  Administered 2013-10-09 – 2013-10-16 (×8): 40 mg via SUBCUTANEOUS
  Filled 2013-10-09 (×9): qty 0.4

## 2013-10-09 MED ORDER — ONDANSETRON HCL 4 MG/2ML IJ SOLN: 4.0000 mg | Freq: Four times a day (QID) | INTRAMUSCULAR | Status: DC | PRN

## 2013-10-09 NOTE — Progress Notes (Signed)
Physical Therapy Treatment Patient Details Name: Mark [name] MRN: [id number] DOB: Jan 26, 1951 Today's Date: 10/09/2013 Time: [id number] PT Time Calculation (min): 29 min  PT Assessment / Plan / Recommendation  History of Present Illness Pt admitted 09/26/13 with infection of L foot. Pt has H/O DM. Pt has had several  surgical I&D's, most recent  for L 3rd toe amputaion, wound closures, skin graft and wound VAC. for wound closure   PT Comments   Assisted pt OOb to amb to BR for a BM then amb back to bed.  Pt c/o increased fatigue and pain.  Follow Up Recommendations  CIR     Does the patient have the potential to tolerate intense rehabilitation     Barriers to Discharge        Equipment Recommendations       Recommendations for Other Services    Frequency Min 6X/week   Progress towards PT Goals Progress towards PT goals: Progressing toward goals  Plan      Precautions / Restrictions Precautions Precautions: Fall Precaution Comments: wound VAC Restrictions Weight Bearing Restrictions: Yes LLE Weight Bearing: Non weight bearing   Pertinent Vitals/Pain    Mobility  Bed Mobility Overal bed mobility: Needs Assistance Bed Mobility: Supine to Sit;Sit to Supine Supine to sit: Min assist Sit to supine: Min assist General bed mobility comments: extra  time  for mobilizing to edge of bed, difficulty getting scooted to edge, noted edema of thighs and scrotum. Transfers Overall transfer level: Needs assistance Equipment used: Rolling walker (2 wheeled) Transfers: Sit to/from Stand Sit to Stand: Mod assist;+2 safety/equipment;From elevated surface General transfer comment: cues fot UE use. safe NWB on LLE.  Assisted on/off bed and on/off bed. Ambulation/Gait Ambulation/Gait assistance: +2 physical assistance;+2 safety/equipment Ambulation Distance (Feet): 24 Feet (to and from bathroom) Assistive device: Rolling walker (2 wheeled) Gait Pattern/deviations: Step-to pattern Gait  velocity: decreased General Gait Details: assisted to and bathroom. C/O increased weakness and fatigue.      PT Goals (current goals can now be found in the care plan section)    Visit Information  Last PT Received On: 10/09/13 Assistance Needed: +2 History of Present Illness: Pt admitted 09/26/13 with infection of L foot. Pt has H/O DM. Pt has had several  surgical I&D's, most recent  for L 3rd toe amputaion, wound closures, skin graft and wound VAC. for wound closure    Subjective Data      Cognition       Balance     End of Session PT - End of Session Equipment Utilized During Treatment: Gait belt Activity Tolerance: Patient limited by fatigue Patient left: in bed;with call bell/phone within reach   Rica Koyanagi  PTA [facility]  Acute  Rehab Pager      [phone]

## 2013-10-09 NOTE — Discharge Summary (Signed)
Physician Discharge Summary  Mark [name] [id number] DOB: 06/08/1951 DOA: 09/26/2013  PCP: No PCP Per Patient  Admit date: 09/26/2013 Discharge date: 10/09/2013  Recommendations for Outpatient Follow-up:  1. Pt will need to follow up with PCP in 2-3 weeks post discharge 2. Please obtain BMP to evaluate electrolytes and kidney function 3. Please also check CBC to evaluate Hg and Hct levels 4. Pt discharged on doxycycline to complete therapy for 2 more weeks post discharge   Discharge Diagnoses: Cellulitis and left foot abscess  Active Problems:   Cellulitis   Sepsis   Type II or unspecified type diabetes mellitus with unspecified complication, uncontrolled   Leukocytosis, unspecified   Hypokalemia   Foot abscess, left   Anemia  Discharge Condition: Stable  Diet recommendation: Heart healthy diet discussed in details   Brief narrative:  63 y.o. male with a past medical history of poorly controlled type 2 diabetes mellitus, medication nonadherence, presents as a direct admit from the urgent care Center. He complained of a two-week history of left foot pain, swelling, erythema which has significantly worsened in the last 2-3 days prior to this admission. He reported associated subjective fevers, chills, malaise. He was found to be febrile at the urgent care center having a temperature 102.7 with lab work showed a white count of 26.8 and a glucose of 359. Patient admitted for sepsis due to cellulitis and abscess of left foot.   Assessment/Plan:  Sepsis due to Cellulitis of foot with abscess and concern for necrotizing fascitis  - admission Xray of foot concerning for necrotizing fascitis, an MRI c/w cellulitis and small abscesses, concern for necrotizing fascitis.  - ortho following, pt is status post I&D (done 01/29), hydrotherapy started on 1/30. 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  - continue analgesia and broad spectrum ABX, Vancomycin day #11, Zosyn day #7 - will change to oral doxycycline on discharge and pt will need to complete 2 more weeks post discharge  Low grade fever  - afebrile over 24 hours  - possibly related to principal problem and post op  - now resolved  Uncontrolled DM with complications of neuropathy and anemia of chronic disease  - A1C of 13.7 . reports not on any meds due to inability to afford. CM consulted. Will need insulin upon d/c.  - continue Lantus with novolog meal coverage  - Emphasize on medication compliance.  Leukocytosis  - secondary to cellulitis and now post op demargination and stress reaction  - continue ABX as noted below  - WBC trending down and is WNL this AM Anemia of chronic disease  - no signs of active bleeding, overall stable  Hypertension  - continue lisinopril and prn hydralazine  - reasonable BP control inpatient  Tobacco abuse  - counseled on cessation  Hypokalemia  - WNL this AM  Malnutrition  - Added supplements inpatient   Code Status: full  Family Communication: Pt at bedside   Consultants:  Ortho  Procedures:  02/03 --> Repeat irrigation and debridement of left foot including removal of  necrotic skin, soft tissue, fascia, and muscle. 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 SURGEON: Dr. Lind Guest. [place] Antibiotics:  Vancomycin 01/31 --> 2/10  Change Maxipime to Zosyn 02/04 --> 2/10 Clindamycin 02/05 --> 2/10 Doxycycline 2/10 --> 2 more weeks post discharge    Discharge Exam: Filed Vitals:   10/09/13 1013  BP: 126/74   Pulse: 73  Temp: 98.8 F (37.1 C)  Resp: 16   Filed Vitals:   10/08/13 2107 10/09/13 0115 10/09/13 0609 10/09/13 1013  BP: 105/66 134/76 145/84 126/74  Pulse: 87 81 69 73  Temp: 98.7 F (37.1 C)  98.8 F (37.1 C) 98.8 F (37.1 C)  TempSrc: Oral  Oral Oral  Resp: 18  17 16   Height:      Weight:      SpO2: 95% 95% 94% 95%    General: Pt is alert, follows commands appropriately, not in acute distress Cardiovascular: Regular rate and rhythm, S1/S2 +, no murmurs, no rubs, no gallops Respiratory: Clear to auscultation bilaterally, no wheezing, no crackles, no rhonchi Abdominal: Soft, non tender, non distended, bowel sounds +, no guarding Extremities: no edema, no cyanosis, pulses palpable bilaterally DP and PT Neuro: Grossly nonfocal  Discharge Instructions  Discharge Orders   Future Orders Complete By Expires   Diet - low sodium heart healthy  As directed    Increase activity slowly  As directed        Medication List         acetaminophen 500 MG tablet  Commonly known as:  TYLENOL  Take 500 mg by mouth every 6 (six) hours as needed.     baclofen 10 MG tablet  Commonly known as:  LIORESAL  Take 1 tablet (10 mg total) by mouth 3 (three) times daily as needed for muscle spasms (hiccups).     bisacodyl 10 MG suppository  Commonly known as:  DULCOLAX  Place 1 suppository (10 mg total) rectally daily as needed for moderate constipation.     doxycycline 100 MG tablet  Commonly known as:  VIBRA-TABS  Take 1 tablet (100 mg total) by mouth 2 (two) times daily.     ferrous gluconate 324 MG tablet  Commonly known as:  FERGON  Take 1 tablet (324 mg total) by mouth 2 (two) times daily with a meal.     ibuprofen 200 MG tablet  Commonly known as:  ADVIL,MOTRIN  Take 200 mg by mouth every 6 (six) hours as needed.     insulin aspart 100 UNIT/ML injection  Commonly known as:  novoLOG  Inject 3 Units into the skin 3 (three) times daily with meals.     insulin glargine 100  UNIT/ML injection  Commonly known as:  LANTUS  Inject 0.1 mLs (10 Units total) into the skin at bedtime.     lisinopril 20 MG tablet  Commonly known as:  PRINIVIL,ZESTRIL  Take 1 tablet (20 mg total) by mouth daily.     oxyCODONE 5 MG immediate release tablet  Commonly known as:  Oxy IR/ROXICODONE  Take 1 tablet (5 mg total) by mouth every 4 (four) hours as needed for moderate pain.           Follow-up Information   Follow up with Mcarthur Rossetti, MD. Schedule an appointment as soon as possible for a visit in 2 weeks.   Specialty:  Orthopedic Surgery   Contact information:   [address] [place] [address] [phone]       Schedule  an appointment as soon as possible for a visit with Faye Ramsay, MD.   Specialty:  Internal Medicine   Contact information:   [address] [place] [address] [phone]        The results of significant diagnostics from this hospitalization (including imaging, microbiology, ancillary and laboratory) are listed below for reference.     Microbiology: Recent Results (from the past 240 hour(s))  CLOSTRIDIUM DIFFICILE BY PCR     Status: None   Collection Time    10/02/13  8:43 AM      Result Value Range Status   C difficile by pcr NEGATIVE  NEGATIVE Final   Comment: Performed at [facility]: Basic Metabolic Panel:  Recent Labs Lab 10/05/13 0455 10/06/13 0520 10/07/13 0540 10/08/13 0427 10/09/13 0510  NA 138 139 140 139 137  K 4.0 4.1 4.0 3.9 4.1  CL 100 100 100 101 100  CO2 30 31 31 31 29   GLUCOSE 77 137* 149* 110* 198*  BUN 11 10 10 12 14   CREATININE 0.89 1.05 1.20 1.21 1.27  CALCIUM 8.4 8.3* 8.3* 8.2* 8.3*   CBC:  Recent Labs Lab 10/05/13 0455 10/06/13 0520 10/07/13 0540 10/08/13 0427 10/09/13 0510  WBC 13.4* 15.6* 15.9* 11.8* 10.3  HGB 10.0* 9.6* 9.5* 9.4* 9.3*  HCT 29.9* 29.7* 29.6* 29.4* 28.7*  MCV 91.7 93.7 93.7 93.6 93.2  PLT 853* 871* 950* 924* 944*   CBG:  Recent Labs Lab 10/08/13 1140 10/08/13 1640 10/08/13 2037 10/09/13 0758 10/09/13 1223  GLUCAP 157* 148* 200* 156* 176*     SIGNED: Time coordinating discharge: Over 30 minutes  Faye Ramsay, MD  [facility] 10/09/2013, 1:52 PM Pager [phone]  If 7PM-7AM, please contact night-coverage [email or web address] Password [email or web address]
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
## 2013-10-09 NOTE — Progress Notes (Signed)
Rehab admissions - I met with patient.  Patient would like to admit to acute inpatient rehab today.  Bed available and can admit to acute inpatient rehab today.  Call me for questions.  #[phone]

## 2013-10-09 NOTE — PMR Pre-admission (Signed)
PMR Admission Coordinator Pre-Admission Assessment  Patient: Mark [name] is an 63 y.o., male MRN: [id number] DOB: Jan 27, 1951 Height: 6\' 1"  (185.4 cm) Weight: 93.441 kg (206 lb)              Insurance Information Self pay  Medicaid Application Date:        Case Manager:   Disability Application Date:        Case Worker:    Emergency Facilities manager Information   Name Relation Home Work [place] [phone]  [phone]     Current Medical History  Patient Admitting Diagnosis: Necrotizing fasciitis left foot/leg, s/p toe amps and STSG. Pt NWB LLE.   History of Present Illness: A 63 y.o. 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 Admitted 09/26/2013 with two-week history of left foot pain swelling and erythema. Patient reported low-grade fever 102.7 as well as chills. Noted white count 26,800 as well as glucose 359. MRI of left foot and 92 showed intense cellulitis of left foot with multiple abscesses identified. Innumerable nodules of gas within soft tissues worrisome for necrotizing fasciitis. Patient placed on broad-spectrum antibiotics. Orthopedic services Dr. Ninfa Linden consulted patient underwent extensive irrigation and debridement of left foot as well as amputation left foot third toe through the proximal metatarsal 10/03/2013 followed by a repeat irrigation and debridement with split thickness skin graft 10/05/2013.VAC placed later removed 10/08/2013 nonweightbearing left foot. Subcutaneous Lovenox for DVT prophylaxis. Pain management as directed. Presently maintained on Zosyn vancomycin with recommendations for orthopedic services to be discharged on oral doxycycline x2 weeks. Physical therapy evaluation completed 10/07/2013 with recommendations for physical medicine rehabilitation consult to consider inpatient rehabilitation services.    Past Medical History  Past  Medical History  Diagnosis Date  . Diabetes mellitus without complication   . Diverticulitis     Family History  family history is not on file.  Prior Rehab/Hospitalizations:  None   Current Medications  Current facility-administered medications:acetaminophen (TYLENOL) suppository 650 mg, 650 mg, Rectal, Q6H PRN, Kelvin Cellar, MD;  acetaminophen (TYLENOL) tablet 650 mg, 650 mg, Oral, Q6H PRN, Kelvin Cellar, MD, 650 mg at 09/28/13 0335;  alum & mag hydroxide-simeth (MAALOX/MYLANTA) 200-200-20 MG/5ML suspension 30 mL, 30 mL, Oral, Q6H PRN, Kelvin Cellar, MD, 30 mL at 10/08/13 [id number] baclofen (LIORESAL) tablet 10 mg, 10 mg, Oral, TID PRN, Ritta Slot, NP, 10 mg at 10/07/13 0818;  bisacodyl (DULCOLAX) suppository 10 mg, 10 mg, Rectal, Daily PRN, Kelvin Cellar, MD;  clindamycin (CLEOCIN) capsule 300 mg, 300 mg, Oral, Q6H, Theodis Blaze, MD, 300 mg at 10/09/13 1121;  enoxaparin (LOVENOX) injection 40 mg, 40 mg, Subcutaneous, Q24H, Theodis Blaze, MD, 40 mg at 10/08/13 1630 feeding supplement (GLUCERNA SHAKE) (GLUCERNA SHAKE) liquid 237 mL, 237 mL, Oral, TID BM, Christie Beckers, RD;  ferrous gluconate (FERGON) tablet 324 mg, 324 mg, Oral, BID WC, Nishant Dhungel, MD, 324 mg at 10/09/13 0811;  hydrALAZINE (APRESOLINE) injection 10 mg, 10 mg, Intravenous, Q6H PRN, Nishant Dhungel, MD;  insulin aspart (novoLOG) injection 0-15 Units, 0-15 Units, Subcutaneous, TID WC, Kelvin Cellar, MD, 3 Units at 10/09/13 1241 insulin aspart (novoLOG) injection 0-5 Units, 0-5 Units, Subcutaneous, QHS, Kelvin Cellar, MD, 3 Units at 09/28/13 2149;  insulin aspart (novoLOG) injection 3 Units, 3 Units, Subcutaneous, TID WC, Theodis Blaze, MD, 3 Units at 10/09/13 1242;  lisinopril (PRINIVIL,ZESTRIL) tablet 20 mg, 20 mg, Oral, Daily, Nishant Dhungel, MD, 20 mg at  10/09/13 1120 morphine 2 MG/ML injection 2 mg, 2 mg, Intravenous, Q4H PRN, Kelvin Cellar, MD, 2 mg at 10/09/13 0811;  ondansetron [facility]) injection 4 mg, 4 mg,  Intravenous, Q6H PRN, Kelvin Cellar, MD, 4 mg at 10/04/13 0213;  ondansetron (ZOFRAN) tablet 4 mg, 4 mg, Oral, Q6H PRN, Kelvin Cellar, MD;  oxyCODONE (Oxy IR/ROXICODONE) immediate release tablet 5 mg, 5 mg, Oral, Q4H PRN, Kelvin Cellar, MD, 5 mg at 10/09/13 0944 piperacillin-tazobactam (ZOSYN) IVPB 3.375 g, 3.375 g, Intravenous, Q8H, Theodis Blaze, MD, 3.375 g at 10/09/13 1120;  vancomycin (VANCOCIN) 1,250 mg in sodium chloride 0.9 % 250 mL IVPB, 1,250 mg, Intravenous, Q12H, Theodis Blaze, MD, 1,250 mg at 10/09/13 0534  Patients Current Diet: General  Precautions / Restrictions Precautions Precautions: Fall Precaution Comments: wound VAC Restrictions Weight Bearing Restrictions: Yes LLE Weight Bearing: Non weight bearing   Prior Activity Level Community (5-7x/wk): Went out daily.  Was driving.  He worked up until 10/14 when job was finished.  Home Assistive Devices / Equipment Home Assistive Devices/Equipment: None Home Equipment: None  Prior Functional Level Prior Function Level of Independence: Independent  Current Functional Level Cognition  Overall Cognitive Status: Within Functional Limits for tasks assessed Orientation Level: Oriented X4    Extremity Assessment (includes Sensation/Coordination)          ADLs       Mobility  Overal bed mobility: Needs Assistance Bed Mobility: Supine to Sit;Sit to Supine Supine to sit: Min assist Sit to supine: Min assist General bed mobility comments: extra  time  for mobilizing to edge of bed, difficulty getting scooted to edge, noted edema of thighs and scrotum.    Transfers  Overall transfer level: Needs assistance Equipment used: Rolling walker (2 wheeled) Transfers: Sit to/from Stand Sit to Stand: Mod assist;+2 safety/equipment;From elevated surface General transfer comment: cues fot UE use. safe NWB on LLE.  Assisted on/off bed and on/off bed.    Ambulation / Gait / Stairs / Wheelchair Mobility   Ambulation/Gait Ambulation Distance (Feet): 24 Feet (to and from bathroom) Gait velocity: decreased General Gait Details: assisted to and bathroom. C/O increased weakness and fatigue.      Posture / Balance      Special needs/care consideration BiPAP/CPAP No CPM No Continuous Drip IV No Dialysis No         Life Vest No Oxygen No Special Bed No Trach Size No Wound Vac (area) No       Skin Has dressings to left foot and left thigh areas.                             Bowel mgmt: Had BM 10/09/13 Bladder mgmt: Voiding in urinal Diabetic mgmt Yes, on insulin at home.    Previous Home Environment Living Arrangements: Spouse/significant other Available Help at Discharge: Family Type of Home: House Home Layout: One level Home Access: Stairs to enter Entrance Stairs-Rails: None Entrance Stairs-Number of Steps: 1 Home Care Services: No  Discharge Living Setting Plans for Discharge Living Setting: House;Lives with (comment) (Lives with wife.) Type of Home at Discharge: House Discharge Home Layout: One level Discharge Home Access: Stairs to enter Entrance Stairs-Number of Steps: 2 Does the patient have any problems obtaining your medications?: No  Social/Family/Support Systems Patient Roles: Spouse;Parent (Daughter locally, she works.) Sport and exercise psychologist Information: Aziz Cheairs - wife [phone] Anticipated Caregiver: self and wife Ability/Limitations of Caregiver: Wife currently not working. Caregiver Availability: Intermittent Discharge Plan  Discussed with Primary Caregiver: Yes Is Caregiver In Agreement with Plan?: Yes Does Caregiver/Family have Issues with Lodging/Transportation while Pt is in Rehab?: No  Goals/Additional Needs Patient/Family Goal for Rehab: PT/OT mod I goals Expected length of stay: 7 days Cultural Considerations: Christian Dietary Needs: Regular with thin liquids Equipment Needs: TBD Pt/Family Agrees to Admission and willing to participate: Yes Program  Orientation Provided & Reviewed with Pt/Caregiver Including Roles  & Responsibilities: Yes  Decrease burden of Care through IP rehab admission: N/A  Possible need for SNF placement upon discharge: Not planned  Patient Condition: This patient's condition remains as documented in the consult dated 10/09/13, in which the Rehabilitation Physician determined and documented that the patient's condition is appropriate for intensive rehabilitative care in an inpatient rehabilitation facility. Will admit to inpatient rehab today.  Preadmission Screen Completed By:  Retta Diones, 10/09/2013 2:03 PM ______________________________________________________________________   Discussed status with Dr. Naaman Plummer on 10/09/13 at 1411 and received telephone approval for admission today.  Admission Coordinator:  Retta Diones, time1411/Date02/10/15

## 2013-10-09 NOTE — Progress Notes (Signed)
NUTRITION FOLLOW UP  Intervention:   - Glucerna shakes TID - Recommend MD change diet to diabetic diet (on regular now) - Will continue to monitor   Nutrition Dx:   Inadequate oral intake related to inability to eat as evidenced by NPO - ongoing but related to poor appetite and gas pain as evidenced by pt report   Goal:   Diet advancement - met  New goal: Pt to consume >90% of meals/supplements  Monitor:   Weights, labs, intake  Assessment:   Pt with history of poorly controlled type 2 diabetes mellitus, medication nonadherence, presents as a direct admit from the urgent care Center. He complains of a two-week history of left foot pain, swelling, erythema which has significantly worsened in the last 2-3 days. He reports associated subjective fevers, chills, malaise, and feeling quite ill. He was found to be febrile at the urgent care center having a temperature 102.7 with lab work showed a white count of 26.8 and a glucose of 359. He has not been on antimicrobial therapy up to this point. He denies trauma to his foot. He has not noted purulence or fluctuant masses. Unfortunately he reports being unable to afford his medications, and reports being off of his insulin for quite some time. Found to have sepsis due to cellulitis of left foot with abscess and concern for necrotizing fascitis. Had irrigation and debridement of left foot 09/27/13.   2/3 - Met with pt who reports skipping meals PTA r/t not being able to buy food. States he has been a diabetic over 20 years and is very aware of the diabetic diet and reads nutrition labels with every food he buys. Able to identify sources of carbohydrates and correct portion sizes. Reports his elevated blood sugars on admission have been related to pt getting older. Denies any significant changes in weight.   2/10 - Reviewed events since last RD visit. 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 Had repeated irrigation and  debridement with split thickness skin graft on 2/6. Wound VAC applied to left foot, removed 2/9. Pt's recorded PO intake recently has been 50-100% of meals. Met with pt who reports poor appetite today due to having a lot of gas that started yesterday. Agreeable to getting Glucerna shakes.    Height: Ht Readings from Last 1 Encounters:  09/26/13 _0  (1.854 m)    Weight Status:   Wt Readings from Last 1 Encounters:  09/26/13 206 lb (93.441 kg)    Re-estimated needs:  Kcal: 2100-2300  Protein: 100-120g  Fluid: 2.1-2.3L/day   Skin: +1 RLE edema, left foot incision   Diet Order: General   Intake/Output Summary (Last 24 hours) at 10/09/13 1151 Last data filed at 10/09/13 0944  Gross per 24 hour  Intake   1260 ml  Output   3542 ml  Net  -2282 ml    Last BM: 2/9   Labs:   Recent Labs Lab 10/07/13 0540 10/08/13 0427 10/09/13 0510  NA 140 139 137  K 4.0 3.9 4.1  CL 100 101 100  CO2 _1 BUN _2 CREATININE 1.20 1.21 1.27  CALCIUM 8.3* 8.2* 8.3*  GLUCOSE 149* 110* 198*    CBG (last 3)   Recent Labs  10/08/13 1640 10/08/13 2037 10/09/13 0758  GLUCAP 148* 200* 156*    Scheduled Meds: . clindamycin  300 mg Oral Q6H  . enoxaparin (LOVENOX) injection  40 mg Subcutaneous Q24H  . ferrous  gluconate  324 mg Oral BID WC  . insulin aspart  0-15 Units Subcutaneous TID WC  . insulin aspart  0-5 Units Subcutaneous QHS  . insulin aspart  3 Units Subcutaneous TID WC  . lisinopril  20 mg Oral Daily  . piperacillin-tazobactam (ZOSYN)  IV  3.375 g Intravenous Q8H  . vancomycin  1,250 mg Intravenous Q12H    Mikey College MS, RD, LDN [phone] Pager [phone] After Hours Pager

## 2013-10-09 NOTE — Progress Notes (Signed)
Rehab admissions - I spoke with wife by phone.  I will plan to come to [facility] and meet with patient this am.  If patient is agreeable, could potentially admit to acute inpatient rehab today.  I will follow up soon.  Call me for questions.  [phone]

## 2013-10-09 NOTE — Progress Notes (Signed)
Gave report to Sharyn Lull, Therapist, sports at [facility]. Left number if had additional questions.

## 2013-10-10 ENCOUNTER — Inpatient Hospital Stay (HOSPITAL_COMMUNITY): Payer: Medicaid Other | Admitting: *Deleted

## 2013-10-10 ENCOUNTER — Inpatient Hospital Stay (HOSPITAL_COMMUNITY): Payer: Medicaid Other | Admitting: Occupational Therapy

## 2013-10-10 DIAGNOSIS — E1142 Type 2 diabetes mellitus with diabetic polyneuropathy: Secondary | ICD-10-CM

## 2013-10-10 DIAGNOSIS — S98139A Complete traumatic amputation of one unspecified lesser toe, initial encounter: Secondary | ICD-10-CM

## 2013-10-10 DIAGNOSIS — M726 Necrotizing fasciitis: Secondary | ICD-10-CM

## 2013-10-10 DIAGNOSIS — E1149 Type 2 diabetes mellitus with other diabetic neurological complication: Secondary | ICD-10-CM

## 2013-10-10 LAB — GLUCOSE, CAPILLARY
Glucose-Capillary: 162 mg/dL — ABNORMAL HIGH (ref 70–99)
Glucose-Capillary: 162 mg/dL — ABNORMAL HIGH (ref 70–99)
Glucose-Capillary: 150 mg/dL — ABNORMAL HIGH (ref 70–99)
Glucose-Capillary: 127 mg/dL — ABNORMAL HIGH (ref 70–99)

## 2013-10-10 MED ORDER — FLUCONAZOLE 100 MG PO TABS
100.0000 mg | ORAL_TABLET | Freq: Every day | ORAL | Status: DC
  Administered 2013-10-11 – 2013-10-16 (×6): 100 mg via ORAL
  Filled 2013-10-10 (×7): qty 1

## 2013-10-10 MED ORDER — FLUCONAZOLE 200 MG PO TABS
200.0000 mg | ORAL_TABLET | Freq: Once | ORAL | Status: AC
  Administered 2013-10-10: 200 mg via ORAL
  Filled 2013-10-10 (×2): qty 1

## 2013-10-10 MED ORDER — OXYCODONE HCL 5 MG PO TABS
10.0000 mg | ORAL_TABLET | Freq: Two times a day (BID) | ORAL | Status: DC
  Administered 2013-10-10 – 2013-10-17 (×15): 10 mg via ORAL
  Filled 2013-10-10 (×15): qty 2

## 2013-10-10 NOTE — Progress Notes (Signed)
Subjective/Complaints: Had some pain early in the morning. Scrotum hurts as much as anything. Left leg tender A 12 point review of systems has been performed and if not noted above is otherwise negative.   Objective: Vital Signs: Blood pressure 143/78, pulse 79, temperature 99 F (37.2 C), temperature source Oral, resp. rate 17, height 6\' 1"  (1.854 m), weight 102.1 kg (225 lb 1.4 oz), SpO2 97.00%. No results found.  Recent Labs  10/09/13 0510 10/09/13 1844  WBC 10.3 9.8  HGB 9.3* 9.4*  HCT 28.7* 28.5*  PLT 944* 869*    Recent Labs  10/08/13 0427 10/09/13 0510 10/09/13 1844  NA 139 137  --   K 3.9 4.1  --   CL 101 100  --   GLUCOSE 110* 198*  --   BUN 12 14  --   CREATININE 1.21 1.27 1.38*  CALCIUM 8.2* 8.3*  --    CBG (last 3)   Recent Labs  10/09/13 1223 10/09/13 1646 10/09/13 2146  GLUCAP 176* 185* 168*    Wt Readings from Last 3 Encounters:  10/09/13 102.1 kg (225 lb 1.4 oz)  09/26/13 93.441 kg (206 lb)  09/26/13 93.441 kg (206 lb)    Physical Exam:  Constitutional: He is oriented to person, place, and time.  HENT:  Head: Normocephalic.  Eyes: EOM are normal.  Neck: Normal range of motion. Neck supple. No thyromegaly present.  Cardiovascular: Normal rate and regular rhythm.  Respiratory: Effort normal and breath sounds normal. No respiratory distress.  GI: Soft. Bowel sounds are normal. He exhibits no distension.  Musculoskeletal:  2+ LE edema  Neurological: He is alert and oriented to person, place, and time.  UE's 5/5. RLE 4- HF, KE, Ankle. LLE is 1+ HF and 2- distally. Difficult to test. Distal sensory loss left greater than right legs.  Skin: Skin graft site clean and dry with Xeroform/dressing in place  Left foot with bulky dressing, graft site clean with some drainage. and appropriately tender. Right foot with some ischemic changes but with palpable pedal pulses  Uro: scrotum swollen and red. Redness between legs  also Psychiatric: He has a normal mood and affect. His behavior is normal. Judgment and thought content normal    Assessment/Plan: 1. Functional deficits secondary to necrotizing fasciitis left foot/leg which require 3+ hours per day of interdisciplinary therapy in a comprehensive inpatient rehab setting. Physiatrist is providing close team supervision and 24 hour management of active medical problems listed below. Physiatrist and rehab team continue to assess barriers to discharge/monitor patient progress toward functional and medical goals. FIM:                                  Medical Problem List and Plan:  1. Necrotizing fasciitis left foot/leg. Status post toe amputation/irrigation and debridement and split thickness skin graft  2. DVT Prophylaxis/Anticoagulation: Subcutaneous Lovenox. Monitor platelet counts and any signs of bleeding  3. Pain Management: Oxycodone as needed as well as baclofen 10 mg 3 times daily as needed muscle spasms hiccups. Monitor with increased mobility   -schedule oxycodone 10mg  bid as well 4. Neuropsych: This patient is capable of making decisions on his own behalf.  5. ID. Zosyn and vancomycin discontinued changed to oral doxycycline x2 weeks  -add diflucan for scrotal fungal infection  6. Diabetes mellitus with poor medical compliance. Hemoglobin A1c 13.7. NovoLog 3 units 3 times  a day. Check blood sugars a.c. and at bedtime. Consider addition Lantus insulin  7. Hypertension. Lisinopril 20 mg daily. Monitor with increased mobility  8. Acute on chronic anemia. Continue iron supplement. Followup CBC   LOS (Days) 1 A FACE TO FACE EVALUATION WAS PERFORMED  SWARTZ,ZACHARY T 10/10/2013 7:42 AM

## 2013-10-10 NOTE — Progress Notes (Signed)
Occupational Therapy Session Note  Patient Details  Name: Mark [name] MRN: [id number] Date of Birth: 1950/09/30  Today's Date: 10/10/2013 Time: 1400-1430 Time Calculation (min): 30 min  Short Term Goals: Week 1:  OT Short Term Goal 1 (Week 1): STG=LTG  Skilled Therapeutic Interventions/Progress Updates:  1:1 self care retraining: pt in recliner on arrival requesting to void bladder. Rn present to redress Lt thigh. Pt sit<>stand Mod (A) from recliner height. Pt static standing pulling down pants mod (A). Pt attempting to void without success. Pt static standing for Lt thigh dressing. Pt attempting void again with therapist holding urinal this attempt. NO void. Pt pulling up pants min (A). . Pt stand pivot to w/c using RW. Pt self propelling w/c to RN station. Pt pushed to laundry room due to time remaining in session. Ot obtained clean laundry. Pt pushed to 61M RN station and pt self propelled w/c back to room. Pt chose not to don underwear at this time due to friend's arrival. RN and PT Shelton Silvas notified of underwear present and clean to help with scrotal edema management. Pt with towel under scrotum in w/c for elevation at this time.  Therapy Documentation Precautions:  Precautions Precautions: Fall Precaution Comments: xeoform on Lt thigh at donor site to remain until falling off Restrictions Weight Bearing Restrictions: Yes LLE Weight Bearing: Non weight bearing Pain:   scrotal discomfort ADL: ADL ADL Comments: See FIM scores  See FIM for current functional status  Therapy/Group: Individual Therapy  Peri Maris 10/10/2013, 3:08 PM Pager: [phone]
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
## 2013-10-10 NOTE — Progress Notes (Signed)
Physical Therapy Session Note  Patient Details  Name: Mark [name] MRN: [id number] Date of Birth: 1950/11/26  Today's Date: 10/10/2013 Time: 1500-1550 Time Calculation (min): 50 min  Short Term Goals: Week 1:  PT Short Term Goal 1 (Week 1): STGs=LTGs secondary to LOS  Skilled Therapeutic Interventions/Progress Updates:    Patient received sitting in wheelchair. Session focused on wheelchair mobility, functional transfers, and activity tolerance. Patient propelled wheelchair 120' x2 with B UE and supervision. NuStep Level 5 with B UE and R LE x9' to increase activity tolerance. Patient transferred back to bed and assisted with doffing pants in supine via rolling to B sides and bridging while maintaining NWB status of L LE. Patient left supine in bed with all needs within reach and RN present.  Therapy Documentation Precautions:  Precautions Precautions: Fall Restrictions Weight Bearing Restrictions: Yes LLE Weight Bearing: Non weight bearing Pain: Pain Assessment Pain Assessment: 0-10 Pain Score: 5  Pain Type: Acute pain Pain Location: Scrotum Pain Descriptors / Indicators: Aching Pain Onset: On-going Pain Intervention(s): Repositioned;Ambulation/increased activity Multiple Pain Sites: No Locomotion : Ambulation Ambulation/Gait Assistance: Not tested (comment) Wheelchair Mobility Distance: 120   See FIM for current functional status  Therapy/Group: Individual Therapy  Lillia Abed. Kylah Maresh, PT, DPT  10/10/2013, 4:37 PM

## 2013-10-10 NOTE — Progress Notes (Signed)
Patient was discharged to CIR. There were not CSW needs at discharge.  Emila Steinhauser C. [place] MSW, [place]

## 2013-10-10 NOTE — Progress Notes (Signed)
Patient information reviewed and entered into [facility] system by Mateo Overbeck, RN, CRRN, PPS Coordinator.  Information including medical coding and functional independence measure will be reviewed and updated through discharge.    

## 2013-10-11 ENCOUNTER — Encounter (HOSPITAL_COMMUNITY): Payer: Self-pay | Admitting: Occupational Therapy

## 2013-10-11 ENCOUNTER — Inpatient Hospital Stay (HOSPITAL_COMMUNITY): Payer: Medicaid Other | Admitting: *Deleted

## 2013-10-11 ENCOUNTER — Inpatient Hospital Stay (HOSPITAL_COMMUNITY): Payer: Self-pay | Admitting: Occupational Therapy

## 2013-10-11 LAB — GLUCOSE, CAPILLARY
Glucose-Capillary: 158 mg/dL — ABNORMAL HIGH (ref 70–99)
Glucose-Capillary: 129 mg/dL — ABNORMAL HIGH (ref 70–99)
Glucose-Capillary: 179 mg/dL — ABNORMAL HIGH (ref 70–99)
Glucose-Capillary: 157 mg/dL — ABNORMAL HIGH (ref 70–99)

## 2013-10-11 NOTE — Progress Notes (Signed)
Physical Therapy Session Note  Patient Details  Name: [name] MRN: [id number] Date of Birth: [birth year]  Today's Date: 10/11/2013 Time: [id number] and 1515-1600 Time Calculation (min): 60 min and 45 min  Short Term Goals: Week 1:  PT Short Term Goal 1 (Week 1): STGs=LTGs secondary to LOS  Skilled Therapeutic Interventions/Progress Updates:   AM Session: Patient received semi-reclined in bed. Session focused on increasing activity tolerance with all functional mobility, functional transfers, and strengthening. Patient supine>sit with HOB elevated and bed rails with supervision, bed>wheelchair and wheelchair>NuStep with minA via squat pivot. Wheelchair mobility with B UE 120' x2 with supervision, wheelchair parts management with supervision/cues/demo. NuStep with B UE and R LE, Level 5 x10'. Gait training x21' with RW and minA, able to maintain NWB precautions on L LE. Wheelchair pushups x5, education provided for pressure relief and UE strengthening.  Patient left sitting in wheelchair with all needs within reach.  PM Session: Patient received sitting in wheelchair, c/o fatigue. Session focused on activity tolerance and UE/LE strengthening. Wheelchair mobility 120' x1 with supervision. Wheelchair pushups x10; B LAQ, B hip flex, B ankle pumps x20 each LE.  Patient returned to room and left supine in bed with all needs within reach.  Therapy Documentation Precautions:  Precautions Precautions: Fall Restrictions Weight Bearing Restrictions: Yes LLE Weight Bearing: Non weight bearing Pain: Pain Assessment Pain Assessment: 0-10 Pain Score: 2  Pain Type: Surgical pain Pain Location: Leg Pain Orientation: Left Pain Descriptors / Indicators: Aching;Sore Pain Onset: Gradual Pain Intervention(s): Ambulation/increased activity;Repositioned Multiple Pain Sites: No Locomotion : Ambulation Ambulation/Gait Assistance: 4: Min assist Wheelchair Mobility Distance: 120   See FIM for  current functional status  Therapy/Group: Individual Therapy  Lillia Abed. Micha Erck, PT, DPT 10/11/2013, 11:28 AM

## 2013-10-11 NOTE — Progress Notes (Signed)
Subjective/Complaints: No new issues. Scrotum perhaps a little better. Left leg still sore. Working with therapy. Motivated.  A 12 point review of systems has been performed and if not noted above is otherwise negative.   Objective: Vital Signs: Blood pressure 148/75, pulse 71, temperature 98.5 F (36.9 C), temperature source Oral, resp. rate 18, height 6\' 1"  (1.854 m), weight 102.1 kg (225 lb 1.4 oz), SpO2 95.00%. No results found.  Recent Labs  10/09/13 0510 10/09/13 1844  WBC 10.3 9.8  HGB 9.3* 9.4*  HCT 28.7* 28.5*  PLT 944* 869*    Recent Labs  10/09/13 0510 10/09/13 1844  NA 137  --   K 4.1  --   CL 100  --   GLUCOSE 198*  --   BUN 14  --   CREATININE 1.27 1.38*  CALCIUM 8.3*  --    CBG (last 3)   Recent Labs  10/10/13 1737 10/10/13 2113 10/11/13 0728  GLUCAP 150* 127* 158*    Wt Readings from Last 3 Encounters:  10/09/13 102.1 kg (225 lb 1.4 oz)  09/26/13 93.441 kg (206 lb)  09/26/13 93.441 kg (206 lb)    Physical Exam:  Constitutional: He is oriented to person, place, and time.  HENT:  Head: Normocephalic.  Eyes: EOM are normal.  Neck: Normal range of motion. Neck supple. No thyromegaly present.  Cardiovascular: Normal rate and regular rhythm.  Respiratory: Effort normal and breath sounds normal. No respiratory distress.  GI: Soft. Bowel sounds are normal. He exhibits no distension.  Musculoskeletal:  2+ LE edema  Neurological: He is alert and oriented to person, place, and time.  UE's 5/5. RLE 4- HF, KE, Ankle. LLE is 1+ HF and 2- distally. Difficult to test. Distal sensory loss left greater than right legs.  Skin: Skin graft site clean and dry with Xeroform/dressing in place  Left foot with bulky dressing, graft site clean with some drainage. and appropriately tender. Right foot with some ischemic changes but with palpable pedal pulses  Uro: scrotum swollen and red. Redness between legs also Psychiatric: He has a normal mood and  affect. His behavior is normal. Judgment and thought content normal    Assessment/Plan: 1. Functional deficits secondary to necrotizing fasciitis left foot/leg which require 3+ hours per day of interdisciplinary therapy in a comprehensive inpatient rehab setting. Physiatrist is providing close team supervision and 24 hour management of active medical problems listed below. Physiatrist and rehab team continue to assess barriers to discharge/monitor patient progress toward functional and medical goals. FIM: FIM - Bathing Bathing Steps Patient Completed: Chest;Right Arm;Left Arm;Abdomen;Front perineal area;Buttocks;Right upper leg;Right lower leg (including foot) (cant not wash Lt thigh or foot due to wounds) Bathing: 3: Mod-Patient completes 5-7 105f 10 parts or 50-74%  FIM - Upper Body Dressing/Undressing Upper body dressing/undressing steps patient completed: Thread/unthread right sleeve of pullover shirt/dresss;Thread/unthread left sleeve of pullover shirt/dress;Put head through opening of pull over shirt/dress;Pull shirt over trunk Upper body dressing/undressing: 5: Set-up assist to: Obtain clothing/put away FIM - Lower Body Dressing/Undressing Lower body dressing/undressing steps patient completed: Thread/unthread right pants leg;Thread/unthread left pants leg;Pull pants up/down;Don/Doff right sock Lower body dressing/undressing: 2: Max-Patient completed 25-49% of tasks     FIM - Radio producer Devices: Bedside commode Toilet Transfers: 4-To toilet/BSC: Min A (steadying Pt. > 75%);4-From toilet/BSC: Min A (steadying Pt. > 75%)  FIM - Bed/Chair Transfer Bed/Chair Transfer Assistive Devices: Arm rests;Bed rails Bed/Chair Transfer: 3: Chair  or W/C > Bed: Mod A (lift or lower assist)  FIM - Locomotion: Wheelchair Distance: 120 Locomotion: Wheelchair: 2: Travels 48 - 149 ft with supervision, cueing or coaxing FIM - Locomotion: Ambulation Locomotion:  Ambulation Assistive Devices: Administrator Ambulation/Gait Assistance: Not tested (comment) Locomotion: Ambulation: 0: Activity did not occur  Comprehension Comprehension Mode: Auditory Comprehension: 7-Follows complex conversation/direction: With no assist  Expression Expression Mode: Verbal Expression: 6-Expresses complex ideas: With extra time/assistive device  Social Interaction Social Interaction: 6-Interacts appropriately with others with medication or extra time (anti-anxiety, antidepressant).  Problem Solving Problem Solving: 5-Solves complex 90% of the time/cues < 10% of the time  Memory Memory: 7-Complete Independence: No helper  Medical Problem List and Plan:  1. Necrotizing fasciitis left foot/leg. Status post toe amputation/irrigation and debridement and split thickness skin graft  2. DVT Prophylaxis/Anticoagulation: Subcutaneous Lovenox. Monitor platelet counts and any signs of bleeding  3. Pain Management: Oxycodone as needed as well as baclofen 10 mg 3 times daily as needed muscle spasms hiccups. Monitor with increased mobility   -schedule oxycodone 10mg  bid as well 4. Neuropsych: This patient is capable of making decisions on his own behalf.  5. ID/wound. Zosyn and vancomycin discontinued changed to oral doxycycline x2 weeks  -added diflucan for scrotal fungal infection   -left ankle/foot wounds quite dry including graft. Will discuss with ortho---probably needs something which maintains some moisture or perhaps a silicone dressing. 6. Diabetes mellitus with poor medical compliance. Hemoglobin A1c 13.7. NovoLog 3 units 3 times a day. Check blood sugars a.c. and at bedtime. Consider addition Lantus insulin  7. Hypertension. Lisinopril 20 mg daily. Monitor with increased mobility  8. Acute on chronic anemia. Continue iron supplement. Followup CBC   LOS (Days) 2 A FACE TO FACE EVALUATION WAS PERFORMED  SWARTZ,ZACHARY T 10/11/2013 8:08 AM
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
## 2013-10-11 NOTE — IPOC Note (Signed)
Overall Plan of Care [facility]) Patient Details Name: Mark [name] MRN: [id number] DOB: 13-Aug-1951  Admitting Diagnosis: Necrotizing fascitis L leg/foot   Hospital Problems: Active Problems:   Necrotizing fasciitis     Functional Problem List: Nursing Pain;Edema;Safety;Endurance;Skin Integrity;Medication Management;Motor  PT Balance;Edema;Endurance;Motor;Pain;Safety;Sensory;Skin Integrity  OT Balance;Edema;Endurance;Pain  SLP    TR         Basic ADL's: OT Grooming;Bathing;Toileting;Dressing     Advanced  ADL's: OT Simple Meal Preparation;Laundry;Light Housekeeping     Transfers: PT Bed Mobility;Bed to Chair;Car;Furniture  OT Toilet;Tub/Shower     Locomotion: PT Ambulation;Wheelchair Mobility;Stairs     Additional Impairments: OT    SLP        TR      Anticipated Outcomes Item Anticipated Outcome  Self Feeding independent  Swallowing      Basic self-care  mod I  Toileting  mod I   Bathroom Transfers mod I  Bowel/Bladder  continent of bowel and bladder  Transfers  mod I  Locomotion  S household ambulation and mod I household and community w/c mobility  Communication     Cognition     Pain  Pain managed at or below 7 with prn medication  Safety/Judgment  n/a   Therapy Plan: PT Intensity: Minimum of 1-2 x/day ,45 to 90 minutes PT Frequency: 5 out of 7 days PT Duration Estimated Length of Stay: 7-10 days OT Intensity: Minimum of 1-2 x/day, 45 to 90 minutes OT Frequency: 5 out of 7 days OT Duration/Estimated Length of Stay: 7 days         Team Interventions: Nursing Interventions Patient/Family Education;Pain Management;Medication Management;Discharge Planning;Skin Care/Wound Management;Psychosocial Support;Disease Management/Prevention  PT interventions Ambulation/gait training;Balance/vestibular training;Community reintegration;Discharge planning;Neuromuscular re-education;Functional mobility training;DME/adaptive equipment instruction;Disease  management/prevention;Pain management;Patient/family education;Psychosocial support;Skin care/wound management;Splinting/orthotics;UE/LE Coordination activities;UE/LE Strength taining/ROM;Therapeutic Exercise;Therapeutic Activities;Wheelchair propulsion/positioning;Stair training  OT Interventions Balance/vestibular training;Community reintegration;Discharge planning;Disease mangement/prevention;DME/adaptive equipment instruction;Functional mobility training;Pain management;Patient/family education;Self Care/advanced ADL retraining;Skin care/wound managment;Therapeutic Activities;UE/LE Strength taining/ROM;UE/LE Coordination activities  SLP Interventions    TR Interventions    SW/CM Interventions      Team Discharge Planning: Destination: PT-Home ,OT- Home , SLP-  Projected Follow-up: PT-None (no f/u recommended at this time, recommendations TBD upon discharge), OT-  None, SLP-  Projected Equipment Needs: PT-Wheelchair (measurements);Wheelchair cushion (measurements);Rolling walker with 5" wheels;To be determined, OT- Rolling walker with 5" wheels;Wheelchair cushion (measurements);Wheelchair (measurements);3 in 1 bedside comode;Tub/shower bench, SLP-  Equipment Details: PT-Patient does not own any DME; recommendations TBD upon discharge, OT-  Patient/family involved in discharge planning: PT- Patient,  OT-Patient, SLP-   MD ELOS: 7 days Medical Rehab Prognosis:  Excellent Assessment: The patient has been admitted for CIR therapies. The team will be addressing, functional mobility, strength, stamina, balance, safety, adaptive techniques/equipment, self-care, bowel and bladder mgt, patient and caregiver education, pain mgt. Goals have been set at Duane Lope, MD, [facility]      See Team Conference Notes for weekly updates to the plan of care

## 2013-10-12 ENCOUNTER — Inpatient Hospital Stay (HOSPITAL_COMMUNITY): Payer: Medicaid Other | Admitting: *Deleted

## 2013-10-12 ENCOUNTER — Encounter (HOSPITAL_COMMUNITY): Payer: Self-pay | Admitting: Occupational Therapy

## 2013-10-12 ENCOUNTER — Inpatient Hospital Stay (HOSPITAL_COMMUNITY): Payer: Self-pay

## 2013-10-12 LAB — GLUCOSE, CAPILLARY
Glucose-Capillary: 173 mg/dL — ABNORMAL HIGH (ref 70–99)
Glucose-Capillary: 147 mg/dL — ABNORMAL HIGH (ref 70–99)
Glucose-Capillary: 135 mg/dL — ABNORMAL HIGH (ref 70–99)
Glucose-Capillary: 165 mg/dL — ABNORMAL HIGH (ref 70–99)

## 2013-10-12 NOTE — Progress Notes (Signed)
Physical Therapy Session Note  Patient Details  Name: Mark [name] MRN: [id number] Date of Birth: 1950-09-12  Today's Date: 10/12/2013 Time: [id number] and [id number] Time Calculation (min): 60 min and 33 min  Short Term Goals: Week 1:  PT Short Term Goal 1 (Week 1): STGs=LTGs secondary to LOS  Skilled Therapeutic Interventions/Progress Updates:    AM Session: Patient received sitting in wheelchair. Session focused on functional transfers, curb negotiation, and wheelchair mobility/parts management; see details below. Patient requiring to use bathroom, transfer wheelchair<>RTS with grab bars and supervision, requires assist for clothing management. Patient able to stand and perform hygiene with supervision. Block practice stand pivot transfers with RW, wheelchair<>mat with min guard progressing to close supervision. Side stepping with RW x5' with min guard. See details below for curb and ramp negotiation. Patient returned to room and left seated in recliner with all needs within reach.  PM Session: Patient received sitting in wheelchair. Session focused on gait training, bed mobility, and wheelchair mobility. Gait training in controlled environment x32' with RW and close supervision, min guard at end of trial secondary to increased fatigue. Emphasis on appropriate R step length. Bed mobility from flat surface without bedrails and supervision. Wheelchair mobility 150' x1 with B UE and supervision. Patient left seated in recliner with all needs within reach.  Therapy Documentation Precautions:  Precautions Precautions: Fall Precaution Comments: xeoform on Lt thigh at donor site to remain until falling off Restrictions Weight Bearing Restrictions: Yes LLE Weight Bearing: Non weight bearing General: Amount of Missed PT Time (min): 12 Minutes Missed Time Reason: Other (comment) (PT late from meeting) Pain: Pain Assessment Pain Assessment: 0-10 Pain Score: 6  Pain Type: Surgical pain Pain  Location: Leg Pain Orientation: Left Pain Descriptors / Indicators: Aching;Sore Pain Onset: On-going Pain Intervention(s): RN made aware;Repositioned;Ambulation/increased activity Multiple Pain Sites: No Locomotion : Stairs / Additional Locomotion Ramp: 5: Supervision (in w/c) Curb: 4: Min assist (with RW; ascends backwards, descends forwards) Product manager Mobility: Yes Wheelchair Assistance: 5: Investment banker, operational Details: Verbal cues for Information systems manager: Both upper extremities Wheelchair Parts Management: Supervision/cueing Distance: 150   See FIM for current functional status  Therapy/Group: Individual Therapy  Lillia Abed. Daneshia Tavano, PT, DPT 10/12/2013, 4:11 PM

## 2013-10-12 NOTE — Progress Notes (Signed)
Physical Therapy Session Note  Patient Details  Name: ISAIS SADLOWSKI MRN: [id number] Date of Birth: [birth year]  Today's Date: 10/12/2013 Time: 1300-1330 Time Calculation (min): 30 min   Skilled Therapeutic Interventions/Progress Updates:  1:1. Pt received sitting in recliner, requesting to use bathroom. Pt unable to t/f sit>stand from low height of recliner so performed scoot t/f recliner>w/c then able to amb 5' w/c<>toilet w/ RW and overall min gurad to min A. Pt req min A for management of clothing during toileting. Focus at end of session on w/c propulsion in community environment including ramp negotiation w/ overall (S). Pt sitting in therapy gym at end of session waiting for next physical therapist.   Therapy Documentation Precautions:  Precautions Precautions: Fall Precaution Comments: xeoform on Lt thigh at donor site to remain until falling off Restrictions Weight Bearing Restrictions: Yes LLE Weight Bearing: Non weight bearing  See FIM for current functional status  Therapy/Group: Individual Therapy  Gilmore Laroche 10/12/2013, 1:37 PM

## 2013-10-12 NOTE — Progress Notes (Signed)
Subjective/Complaints: Up with therapy cleaning up already. No new complaints. Pain under reasonable control. No fever.  A 12 point review of systems has been performed and if not noted above is otherwise negative.   Objective: Vital Signs: Blood pressure 130/82, pulse 73, temperature 98.5 F (36.9 C), temperature source Oral, resp. rate 19, height 6\' 1"  (1.854 m), weight 102.1 kg (225 lb 1.4 oz), SpO2 98.00%. No results found.  Recent Labs  10/09/13 1844  WBC 9.8  HGB 9.4*  HCT 28.5*  PLT 869*    Recent Labs  10/09/13 1844  CREATININE 1.38*   CBG (last 3)   Recent Labs  10/11/13 1130 10/11/13 1637 10/11/13 2108  GLUCAP 129* 179* 157*    Wt Readings from Last 3 Encounters:  10/09/13 102.1 kg (225 lb 1.4 oz)  09/26/13 93.441 kg (206 lb)  09/26/13 93.441 kg (206 lb)    Physical Exam:  Constitutional: He is oriented to person, place, and time.  HENT:  Head: Normocephalic.  Eyes: EOM are normal.  Neck: Normal range of motion. Neck supple. No thyromegaly present.  Cardiovascular: Normal rate and regular rhythm.  Respiratory: Effort normal and breath sounds normal. No respiratory distress.  GI: Soft. Bowel sounds are normal. He exhibits no distension.  Musculoskeletal:  2+ LE edema  Neurological: He is alert and oriented to person, place, and time.  UE's 5/5. RLE 4- HF, KE, Ankle. LLE is 1+ HF and 2- distally. Difficult to test. Distal sensory loss left greater than right legs.  Skin: Skin graft site clean and dry with Xeroform/dressing in place  Left foot with bulky dressing, graft site clean with some drainage. and appropriately tender. Right foot with some ischemic changes but with palpable pedal pulses  Uro: scrotum swollen and red. Redness between legs also Psychiatric: He has a normal mood and affect. His behavior is normal. Judgment and thought content normal    Assessment/Plan: 1. Functional deficits secondary to necrotizing fasciitis left  foot/leg which require 3+ hours per day of interdisciplinary therapy in a comprehensive inpatient rehab setting. Physiatrist is providing close team supervision and 24 hour management of active medical problems listed below. Physiatrist and rehab team continue to assess barriers to discharge/monitor patient progress toward functional and medical goals. FIM: FIM - Bathing Bathing Steps Patient Completed: Chest;Right Arm;Left Arm;Abdomen;Front perineal area;Buttocks;Right upper leg;Right lower leg (including foot) (cant not wash Lt thigh or foot due to wounds) Bathing: 3: Mod-Patient completes 5-7 6f 10 parts or 50-74%  FIM - Upper Body Dressing/Undressing Upper body dressing/undressing steps patient completed: Thread/unthread right sleeve of pullover shirt/dresss;Thread/unthread left sleeve of pullover shirt/dress;Put head through opening of pull over shirt/dress;Pull shirt over trunk Upper body dressing/undressing: 5: Set-up assist to: Obtain clothing/put away FIM - Lower Body Dressing/Undressing Lower body dressing/undressing steps patient completed: Thread/unthread right pants leg;Thread/unthread left pants leg;Pull pants up/down;Don/Doff right sock Lower body dressing/undressing: 2: Max-Patient completed 25-49% of tasks     FIM - Radio producer Devices: Bedside commode Toilet Transfers: 4-To toilet/BSC: Min A (steadying Pt. > 75%);4-From toilet/BSC: Min A (steadying Pt. > 75%)  FIM - Bed/Chair Transfer Bed/Chair Transfer Assistive Devices: Arm rests;Bed rails;HOB elevated Bed/Chair Transfer: 5: Supine > Sit: Supervision (verbal cues/safety issues);4: Bed > Chair or W/C: Min A (steadying Pt. > 75%);4: Chair or W/C > Bed: Min A (steadying Pt. > 75%)  FIM - Locomotion: Wheelchair Distance: 120 Locomotion: Wheelchair: 2: Travels 50 - 149 ft with supervision,  cueing or coaxing FIM - Locomotion: Ambulation Locomotion: Ambulation Assistive Devices: Walker -  Rolling Ambulation/Gait Assistance: 4: Min assist Locomotion: Ambulation: 1: Travels less than 50 ft with minimal assistance (Pt.>75%)  Comprehension Comprehension Mode: Auditory Comprehension: 7-Follows complex conversation/direction: With no assist  Expression Expression Mode: Verbal Expression: 6-Expresses complex ideas: With extra time/assistive device  Social Interaction Social Interaction: 7-Interacts appropriately with others - No medications needed.  Problem Solving Problem Solving: 6-Solves complex problems: With extra time  Memory Memory: 7-Complete Independence: No helper  Medical Problem List and Plan:  1. Necrotizing fasciitis left foot/leg. Status post toe amputation/irrigation and debridement and split thickness skin graft  2. DVT Prophylaxis/Anticoagulation: Subcutaneous Lovenox. Monitor platelet counts and any signs of bleeding  3. Pain Management: Oxycodone as needed as well as baclofen 10 mg 3 times daily as needed muscle spasms hiccups. Monitor with increased mobility   -scheduled oxycodone 10mg  bid with therapies in addition to prn 4. Neuropsych: This patient is capable of making decisions on his own behalf.  5. ID/wound. Zosyn and vancomycin discontinued changed to oral doxycycline x2 weeks  -diflucan for scrotal fungal infection   -left ankle/foot wounds quite dry including graft. Will discuss with ortho---probably needs something which maintains some moisture or perhaps a silicone dressing. 6. Diabetes mellitus with poor medical compliance. Hemoglobin A1c 13.7. NovoLog 3 units 3 times a day. Check blood sugars a.c. and at bedtime. Improved control as a whole 7. Hypertension. Lisinopril 20 mg daily. Monitor with increased mobility  8. Acute on chronic anemia. Continue iron supplement. Followup CBC   LOS (Days) 3 A FACE TO FACE EVALUATION WAS PERFORMED  Tiffay Pinette T 10/12/2013 7:47 AM

## 2013-10-12 NOTE — Progress Notes (Signed)
Spoke with Dr. Trevor Mace office and his PA Carney Bern in reference to graft site and advises to leave the Xeroform in place no moisturizer as needed this time and they would followup to check wound

## 2013-10-12 NOTE — Progress Notes (Signed)
Occupational Therapy Session Note  Patient Details  Name: Mark [name] MRN: [id number] Date of Birth: September 01, 1950  Today's Date: 10/12/2013 Time: 0700-0800 Time Calculation (min): 60 min  Short Term Goals: Week 1:  OT Short Term Goal 1 (Week 1): STG=LTG  Skilled Therapeutic Interventions/Progress Updates:    1:1 self care retraining/ tub transfer: pt supine on arrival and with [facility] flat completed bed mobility MOD I with incr time. Pt transferred x3 attempts sit<>stand with RW supervision level to w/c. Pt needed x3 attempts to complete task due not scooting close enough to the EOB to allow anterior weight shift / hip flexion. Pt sitting at sink for UB bathing and anterior peri care. Pt sit<>Stand with RW for peri care posterior and pulling up LB clothing. Pt prop RT LE on cabinet drawer and could simulate this with trash can at home in bathroom. Pt educated on parking w/c at bathroom entrance, use of RW over threshold and transfer onto tub bench. Pt completed transfer. Pt educated that due to wounds currently tub bench is not allowed. Pt educated on possible need to wrap LT LE to prevent getting wet with bag /tape method. Pt has Lt thigh graft site that is not allowed to get wet at this point and time. Pt transferring back to w/c and positioned in room with breakfast tray.  Therapy Documentation Precautions:  Precautions Precautions: Fall Precaution Comments: xeoform on Lt thigh at donor site to remain until falling off Restrictions Weight Bearing Restrictions: Yes LLE Weight Bearing: Non weight bearing General:   Vital Signs:   Pain: Pain Assessment No pain reported at this time Premedicated  ADL: ADL ADL Comments: See FIM scores  See FIM for current functional status  Therapy/Group: Individual Therapy  Peri Maris 10/12/2013, 3:06 PM Pager: [phone]

## 2013-10-12 NOTE — Plan of Care (Signed)
Problem: RH PAIN MANAGEMENT Goal: RH STG PAIN MANAGED AT OR BELOW PT'S PAIN GOAL Pain level at or below 4  Outcome: Not Progressing Rate pain at a 8/10
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
## 2013-10-13 ENCOUNTER — Inpatient Hospital Stay (HOSPITAL_COMMUNITY): Payer: Medicaid Other | Admitting: *Deleted

## 2013-10-13 DIAGNOSIS — E1142 Type 2 diabetes mellitus with diabetic polyneuropathy: Secondary | ICD-10-CM

## 2013-10-13 DIAGNOSIS — S98139A Complete traumatic amputation of one unspecified lesser toe, initial encounter: Secondary | ICD-10-CM

## 2013-10-13 DIAGNOSIS — M726 Necrotizing fasciitis: Secondary | ICD-10-CM

## 2013-10-13 DIAGNOSIS — E1149 Type 2 diabetes mellitus with other diabetic neurological complication: Secondary | ICD-10-CM

## 2013-10-13 LAB — GLUCOSE, CAPILLARY
Glucose-Capillary: 158 mg/dL — ABNORMAL HIGH (ref 70–99)
Glucose-Capillary: 155 mg/dL — ABNORMAL HIGH (ref 70–99)
Glucose-Capillary: 167 mg/dL — ABNORMAL HIGH (ref 70–99)
Glucose-Capillary: 170 mg/dL — ABNORMAL HIGH (ref 70–99)

## 2013-10-13 NOTE — Plan of Care (Signed)
Problem: RH PAIN MANAGEMENT Goal: RH STG PAIN MANAGED AT OR BELOW PT'S PAIN GOAL Pain level at or below 5  Outcome: Not Progressing Rate pain at 8/10

## 2013-10-13 NOTE — Progress Notes (Signed)
Occupationall Therapy Note  Patient Details  Name: CONNARD THORN MRN: [id number] Date of Birth: June 11, 1951 Today's Date: 10/13/2013  Time: 1025-1105  (40 min) Pain:  6/10 left foot Individual session  Focus of treatment was bed mobility, transfers,standing balance,, therapeutic activities, , postural control.  Pt. Went from sit to stand with SBA.  Transferred to wc from recliner with min gurard assist.  Pt. Did sit to stand with verbal cues to hold middle of walker for balance instead of side.  Pt. Stood for 1 minute x3 to wash or don pants with min guard for balance.  Returned to recliner with SBA and stand pivot transfer.       Lisa Roca 10/13/2013, 11:00 AM

## 2013-10-13 NOTE — Progress Notes (Signed)
Subjective/Complaints: Pt slept ok, accidentally put wt on left foot,  Had pain for a few minutes after that, blamed confusion from pain meds A 12 point review of systems has been performed and if not noted above is otherwise negative.   Objective: Vital Signs: Blood pressure 151/84, pulse 74, temperature 98.4 F (36.9 C), temperature source Oral, resp. rate 18, height 6\' 1"  (1.854 m), weight 102.1 kg (225 lb 1.4 oz), SpO2 96.00%. No results found. No results found for this basename: WBC, HGB, HCT, PLT,  in the last 72 hours No results found for this basename: NA, K, CL, CO, GLUCOSE, BUN, CREATININE, CALCIUM,  in the last 72 hours CBG (last 3)   Recent Labs  10/12/13 1629 10/12/13 2109 10/13/13 0701  GLUCAP 135* 165* 158*    Wt Readings from Last 3 Encounters:  10/09/13 102.1 kg (225 lb 1.4 oz)  09/26/13 93.441 kg (206 lb)  09/26/13 93.441 kg (206 lb)    Physical Exam:  Constitutional: He is oriented to person, place, and time.  HENT:  Head: Normocephalic.  Eyes: EOM are normal.  Neck: Normal range of motion. Neck supple. No thyromegaly present.  Cardiovascular: Normal rate and regular rhythm.  Respiratory: Effort normal and breath sounds normal. No respiratory distress.  GI: Soft. Bowel sounds are normal. He exhibits no distension.  Musculoskeletal:  2+ LE edema  Neurological: He is alert and oriented to person, place, and time.  UE's 5/5. RLE 4- HF, KE, Ankle. LLE is 1+ HF and 2- distally. Difficult to test. Distal sensory loss left greater than right legs.  Skin: Skin graft site clean and dry with Xeroform/dressing in place  Left foot with bulky dressing, graft site clean with some drainage. and appropriately tender. Right foot with some ischemic changes but with palpable pedal pulses  Uro: scrotum swollen and red. Redness between legs also Psychiatric: He has a normal mood and affect. His behavior is normal. Judgment and thought content normal     Assessment/Plan: 1. Functional deficits secondary to necrotizing fasciitis left foot/leg which require 3+ hours per day of interdisciplinary therapy in a comprehensive inpatient rehab setting. Physiatrist is providing close team supervision and 24 hour management of active medical problems listed below. Physiatrist and rehab team continue to assess barriers to discharge/monitor patient progress toward functional and medical goals. FIM: FIM - Bathing Bathing Steps Patient Completed: Chest;Right Arm;Left Arm;Abdomen;Front perineal area;Buttocks;Right upper leg;Right lower leg (including foot);Left upper leg Bathing: 5: Set-up assist to: Obtain items (sit<.stand sink level; omit Rt foot due to wound)  FIM - Upper Body Dressing/Undressing Upper body dressing/undressing steps patient completed: Thread/unthread right sleeve of pullover shirt/dresss;Thread/unthread left sleeve of pullover shirt/dress;Put head through opening of pull over shirt/dress;Pull shirt over trunk Upper body dressing/undressing: 5: Supervision: Safety issues/verbal cues FIM - Lower Body Dressing/Undressing Lower body dressing/undressing steps patient completed: Thread/unthread left underwear leg;Thread/unthread right underwear leg;Pull underwear up/down;Thread/unthread right pants leg;Thread/unthread left pants leg;Pull pants up/down;Don/Doff right sock Lower body dressing/undressing: 5: Set-up assist to: Obtain clothing (able to cross bil LE seated to thread pants)  FIM - Toileting Toileting steps completed by patient: Adjust clothing prior to toileting;Performs perineal hygiene;Adjust clothing after toileting Toileting: 4: Assist with fasteners  FIM - Radio producer Devices: Walker;Grab bars Toilet Transfers: 4-To toilet/BSC: Min A (steadying Pt. > 75%);4-From toilet/BSC: Min A (steadying Pt. > 75%)  FIM - Control and instrumentation engineer Devices: Walker;Arm  rests Bed/Chair Transfer: 5: Supine >  Sit: Supervision (verbal cues/safety issues);5: Sit > Supine: Supervision (verbal cues/safety issues);5: Chair or W/C > Bed: Supervision (verbal cues/safety issues);5: Bed > Chair or W/C: Supervision (verbal cues/safety issues)  FIM - Locomotion: Wheelchair Distance: 150 Locomotion: Wheelchair: 5: Travels 150 ft or more: maneuvers on rugs and over door sills with supervision, cueing or coaxing FIM - Locomotion: Ambulation Locomotion: Ambulation Assistive Devices: Administrator Ambulation/Gait Assistance: 4: Min guard Locomotion: Ambulation: 1: Travels less than 50 ft with minimal assistance (Pt.>75%)  Comprehension Comprehension Mode: Auditory Comprehension: 7-Follows complex conversation/direction: With no assist  Expression Expression Mode: Verbal Expression: 6-Expresses complex ideas: With extra time/assistive device  Social Interaction Social Interaction: 7-Interacts appropriately with others - No medications needed.  Problem Solving Problem Solving: 6-Solves complex problems: With extra time  Memory Memory: 7-Complete Independence: No helper  Medical Problem List and Plan:  1. Necrotizing fasciitis left foot/leg. 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 DVT Prophylaxis/Anticoagulation: Subcutaneous Lovenox. Monitor platelet counts and any signs of bleeding  3. Pain Management: Oxycodone as needed as well as baclofen 10 mg 3 times daily as needed muscle spasms hiccups. Monitor with increased mobility   -scheduled oxycodone 10mg  bid with therapies in addition to prn 4. Neuropsych: This patient is capable of making decisions on his own behalf.  5. ID/wound. Zosyn and vancomycin discontinued changed to oral doxycycline x2 weeks  -diflucan for scrotal fungal infection   -left ankle/foot wounds quite dry including graft. Will discuss with ortho---probably needs something which maintains  some moisture or perhaps a silicone dressing. 6. Diabetes mellitus with poor medical compliance. Hemoglobin A1c 13.7. NovoLog 3 units 3 times a day. Check blood sugars a.c. and at bedtime. Improved control as a whole 7. Hypertension. Lisinopril 20 mg daily. Monitor with increased mobility  8. Acute on chronic anemia. Continue iron supplement. Followup CBC   LOS (Days) 4 A FACE TO FACE EVALUATION WAS PERFORMED  Charlett Blake 10/13/2013 10:13 AM

## 2013-10-14 ENCOUNTER — Inpatient Hospital Stay (HOSPITAL_COMMUNITY): Payer: Medicaid Other | Admitting: Physical Therapy

## 2013-10-14 ENCOUNTER — Inpatient Hospital Stay (HOSPITAL_COMMUNITY): Payer: Medicaid Other | Admitting: *Deleted

## 2013-10-14 LAB — GLUCOSE, CAPILLARY
Glucose-Capillary: 189 mg/dL — ABNORMAL HIGH (ref 70–99)
Glucose-Capillary: 148 mg/dL — ABNORMAL HIGH (ref 70–99)
Glucose-Capillary: 153 mg/dL — ABNORMAL HIGH (ref 70–99)
Glucose-Capillary: 137 mg/dL — ABNORMAL HIGH (ref 70–99)

## 2013-10-14 NOTE — Plan of Care (Signed)
Problem: RH PAIN MANAGEMENT Goal: RH STG PAIN MANAGED AT OR BELOW PT'S PAIN GOAL Pain level at or below 5  Outcome: Not Progressing Consistently rates pain 7+/10

## 2013-10-14 NOTE — Progress Notes (Signed)
Physical Therapy Session Note  Patient Details  Name: Mark [name] MRN: [id number] Date of Birth: 05/19/1951  Today's Date: 10/14/2013 Time: 0800-0900 and 1130-1200 Time Calculation (min): 60 min and   Short Term Goals: Week 1:  PT Short Term Goal 1 (Week 1): STGs=LTGs secondary to LOS  Skilled Therapeutic Interventions/Progress Updates:    First session: Patient received semi-reclined in bed. Session focused on functional transfers, wheelchair mobility, gait training, and UE strengthening. Wheelchair mobility in room, negotiating obstacles in confined spaces 12' x1 and in controlled environment  175' x2 with B UE and mod I. Gait training in controlled environment 34' x1 with RW and supervision, no overt LOB. Patient mod I with all wheelchair parts management. Seated tricep pushups with blocks 2x10. Attempted negotiation of 2 consecutive steps, but patient with anxiety and declining to attempt. Extensive discussion about STE home and patient reporting it is one small step onto porch (which is deep enough to fit RW), then small threshold into home. Patient states he will not need to negotiate consecutive steps to enter home. Patient returned to room and left seated in wheelchair with all needs within reach.  Second session: Patient received sitting in recliner. Session focused on wheelchair mobility, functional transfers, and gait training with shoe donned on R LE. Wheelchair mobility 175' x2 with B UE and R LE. Gait training 18' x1 with RW and supervision (with R shoe donned). Patient reports increased stability and demonstrating improved L foot clearance due to shoe height on R foot. Patient returned to room and transferred back to recliner with all needs within reach.  Therapy Documentation Precautions:  Precautions Precautions: Fall Restrictions Weight Bearing Restrictions: Yes LLE Weight Bearing: Non weight bearing Pain: Pain Assessment Pain Assessment: 0-10 Pain Score: 7  Pain  Type: Surgical pain Pain Location: Leg Pain Orientation: Left Pain Descriptors / Indicators: Aching;Sore Pain Onset: Gradual Pain Intervention(s): RN made aware;Repositioned;Ambulation/increased activity Multiple Pain Sites: No  See FIM for current functional status  Therapy/Group: Individual Therapy  Lillia Abed. Talonda Artist, PT, DPT 10/14/2013, 8:55 AM

## 2013-10-14 NOTE — Progress Notes (Signed)
Physical Therapy Session Note  Patient Details  Name: Mark [name] MRN: [id number] Date of Birth: Jan 19, 1951  Today's Date: 10/14/2013 Time: [id number] Time Calculation (min): 55 min  Short Term Goals: Week 1:  PT Short Term Goal 1 (Week 1): STGs=LTGs secondary to LOS  Skilled Therapeutic Interventions/Progress Updates:  Pt was seen bedside in the pm. Pt transferred chair to w/c with rolling walker and S, NWB L LE. Pt propelled w/c to gym with B UEs and R LE I. Performed w/c push ups for UE strengthening, LE exercise for strengthening and flexibility. Nu-step x 10 mins at level 3 with B UEs and R LE. Pt returned to room pushing w/c with B UEs and R LE I. Pt transferred w/c to chair with rolling walker and S, NWB L LE.   Therapy Documentation Precautions:  Precautions Precautions: Fall Precaution Comments: xeoform on Lt thigh at donor site to remain until falling off Restrictions Weight Bearing Restrictions: Yes LLE Weight Bearing: Non weight bearing General:   Pain: Pt c/o pain L thigh donor site.  See FIM for current functional status  Therapy/Group: Individual Therapy  Dub Amis 10/14/2013, 3:16 PM

## 2013-10-14 NOTE — Progress Notes (Signed)
Subjective/Complaints: No c/os good appetite, BS elevated last noc, tolerating PT A 12 point review of systems has been performed and if not noted above is otherwise negative.   Objective: Vital Signs: Blood pressure 159/78, pulse 67, temperature 98.2 F (36.8 C), temperature source Oral, resp. rate 16, height 6\' 1"  (1.854 m), weight 102.1 kg (225 lb 1.4 oz), SpO2 98.00%. No results found. No results found for this basename: WBC, HGB, HCT, PLT,  in the last 72 hours No results found for this basename: NA, K, CL, CO, GLUCOSE, BUN, CREATININE, CALCIUM,  in the last 72 hours CBG (last 3)   Recent Labs  10/13/13 1620 10/13/13 2135 10/14/13 0736  GLUCAP 167* 170* 189*    Wt Readings from Last 3 Encounters:  10/09/13 102.1 kg (225 lb 1.4 oz)  09/26/13 93.441 kg (206 lb)  09/26/13 93.441 kg (206 lb)    Physical Exam:  Constitutional: He is oriented to person, place, and time.  HENT:  Head: Normocephalic.  Eyes: EOM are normal.  Neck: Normal range of motion. Neck supple. No thyromegaly present.  Cardiovascular: Normal rate and regular rhythm.  Respiratory: Effort normal and breath sounds normal. No respiratory distress.  GI: Soft. Bowel sounds are normal. He exhibits no distension.  Musculoskeletal:  2+ LE edema  Neurological: He is alert and oriented to person, place, and time.  UE's 5/5. RLE 4- HF, KE, Ankle. LLE is 1+ HF and 2- distally. Difficult to test. Distal sensory loss left greater than right legs.  Skin: Skin graft site clean and dry with Xeroform/dressing in place  Left foot with bulky dressing, graft site clean with some drainage. and appropriately tender. Right foot with some ischemic changes but with palpable pedal pulses  Uro: scrotum swollen and red. Redness between legs also Psychiatric: He has a normal mood and affect. His behavior is normal. Judgment and thought content normal    Assessment/Plan: 1. Functional deficits secondary to necrotizing  fasciitis left foot/leg which require 3+ hours per day of interdisciplinary therapy in a comprehensive inpatient rehab setting. Physiatrist is providing close team supervision and 24 hour management of active medical problems listed below. Physiatrist and rehab team continue to assess barriers to discharge/monitor patient progress toward functional and medical goals. FIM: FIM - Bathing Bathing Steps Patient Completed: Chest;Right Arm;Left Arm;Abdomen;Front perineal area;Buttocks;Right upper leg;Right lower leg (including foot);Left upper leg Bathing: 4: Steadying assist  FIM - Upper Body Dressing/Undressing Upper body dressing/undressing steps patient completed: Thread/unthread right sleeve of pullover shirt/dresss;Thread/unthread left sleeve of pullover shirt/dress;Put head through opening of pull over shirt/dress;Pull shirt over trunk Upper body dressing/undressing: 5: Supervision: Safety issues/verbal cues FIM - Lower Body Dressing/Undressing Lower body dressing/undressing steps patient completed: Thread/unthread left underwear leg;Thread/unthread right underwear leg;Pull underwear up/down;Thread/unthread right pants leg;Thread/unthread left pants leg;Pull pants up/down;Don/Doff right sock Lower body dressing/undressing: 4: Steadying Assist  FIM - Toileting Toileting steps completed by patient: Adjust clothing prior to toileting;Performs perineal hygiene;Adjust clothing after toileting Toileting: 4: Assist with fasteners  FIM - Radio producer Devices: Walker;Grab bars Toilet Transfers: 4-To toilet/BSC: Min A (steadying Pt. > 75%);4-From toilet/BSC: Min A (steadying Pt. > 75%)  FIM - Bed/Chair Transfer Bed/Chair Transfer Assistive Devices: Bed rails Bed/Chair Transfer: 5: Supine > Sit: Supervision (verbal cues/safety issues);5: Sit > Supine: Supervision (verbal cues/safety issues);5: Chair or W/C > Bed: Supervision (verbal cues/safety issues);5: Bed > Chair or  W/C: Supervision (verbal cues/safety issues)  FIM - Locomotion: Wheelchair Distance:  150 Locomotion: Wheelchair: 5: Travels 150 ft or more: maneuvers on rugs and over door sills with supervision, cueing or coaxing FIM - Locomotion: Ambulation Locomotion: Ambulation Assistive Devices: Administrator Ambulation/Gait Assistance: 4: Min guard Locomotion: Ambulation: 1: Travels less than 50 ft with minimal assistance (Pt.>75%)  Comprehension Comprehension Mode: Auditory Comprehension: 7-Follows complex conversation/direction: With no assist  Expression Expression Mode: Verbal Expression: 6-Expresses complex ideas: With extra time/assistive device  Social Interaction Social Interaction: 7-Interacts appropriately with others - No medications needed.  Problem Solving Problem Solving: 6-Solves complex problems: With extra time  Memory Memory: 7-Complete Independence: No helper  Medical Problem List and Plan:  1. Necrotizing fasciitis left foot/leg. 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 DVT Prophylaxis/Anticoagulation: Subcutaneous Lovenox. Monitor platelet counts and any signs of bleeding  3. Pain Management: Oxycodone as needed as well as baclofen 10 mg 3 times daily as needed muscle spasms hiccups. Monitor with increased mobility   -scheduled oxycodone 10mg  bid with therapies in addition to prn 4. Neuropsych: This patient is capable of making decisions on his own behalf.  5. ID/wound.  oral doxycycline x2 weeks  -diflucan for scrotal fungal infection   -left ankle/foot wounds quite dry including graft. Will discuss with ortho---probably needs something which maintains some moisture or perhaps a silicone dressing. 6. Diabetes mellitus with poor medical compliance. Hemoglobin A1c 13.7. NovoLog 3 units 3 times a day. Check blood sugars a.c. and at bedtime. Improved control as a whole 7. Hypertension. Lisinopril 20 mg daily. Monitor  with increased mobility  8. Acute on chronic anemia. Continue iron supplement. Followup CBC   LOS (Days) 5 A FACE TO FACE EVALUATION WAS PERFORMED  KIRSTEINS,ANDREW E 10/14/2013 8:08 AM

## 2013-10-14 NOTE — Progress Notes (Signed)
Occupational Therapy Session Note  Patient Details  Name: Mark [name] MRN: [id number] Date of Birth: 29-Aug-1951  Today's Date: 10/14/2013 Time: [id number] Time Calculation (min): 60 min  Short Term Goals: Week 1:  OT Short Term Goal 1 (Week 1): STG=LTG  Skilled Therapeutic Interventions/Progress Updates:    Pt. Sitting in wc upon OT arrival.  Propelled wc to sink to engage in bathing and dressing.  Addressed sit to stand,  Standing balance, transfers.  Pt. Ambulated with RW to bathroom with minimal assist and transferred to 3n1 over toilet.  Pt had BM.  Pt. Ambulated back to wc with minimal assist.  Pt reported he felt better balance when he held walker to side rather than middle when standing.  .   See FIM for levels.   Propelled wc to gym.  Did scifit for 5 min at 8 level at 30 rpm counterclockwise and then 3  min. Clockwise.  Pt. Propelled self back to rooom. Left pt in room with call bell in place.    Therapy Documentation Precautions:  Precautions Precautions: Fall Precaution Comments: xeoform on Lt thigh at donor site to remain until falling off Restrictions Weight Bearing Restrictions: Yes LLE Weight Bearing: Non weight bearing      Pain: Pain Assessment Pain Assessment: 0-10 Pain Score: 7  Pain Type: Surgical pain Pain Location: Leg Pain Orientation: Left Pain Descriptors / Indicators: Aching;Sore Pain Frequency: Intermittent Pain Onset: Gradual Pain Intervention(s): RN made aware;Repositioned;Ambulation/increased activity Multiple Pain Sites: No ADL: ADL ADL Comments: See FIM scores    :    See FIM for current functional status  Therapy/Group: Individual Therapy  Lisa Roca 10/14/2013, 9:48 AM

## 2013-10-15 ENCOUNTER — Inpatient Hospital Stay (HOSPITAL_COMMUNITY): Payer: Medicaid Other | Admitting: *Deleted

## 2013-10-15 ENCOUNTER — Inpatient Hospital Stay (HOSPITAL_COMMUNITY): Payer: Self-pay | Admitting: *Deleted

## 2013-10-15 ENCOUNTER — Inpatient Hospital Stay (HOSPITAL_COMMUNITY): Payer: Medicaid Other | Admitting: Occupational Therapy

## 2013-10-15 ENCOUNTER — Inpatient Hospital Stay (HOSPITAL_COMMUNITY): Payer: Self-pay

## 2013-10-15 LAB — GLUCOSE, CAPILLARY
Glucose-Capillary: 216 mg/dL — ABNORMAL HIGH (ref 70–99)
Glucose-Capillary: 94 mg/dL (ref 70–99)
Glucose-Capillary: 121 mg/dL — ABNORMAL HIGH (ref 70–99)
Glucose-Capillary: 204 mg/dL — ABNORMAL HIGH (ref 70–99)

## 2013-10-15 NOTE — Progress Notes (Signed)
Pt found sitting in chair  Reported feeling weak, CBG 94; gave glucerna and rice krispies with 2% milk. Assisted to BR with good results and back to bed. See flow sheet for vital signs. PT able to respond however not as bright and alert as earlier. Noted feels foggy. No other changes noted. Able to order lunch menu and carry conversation. Fed self drink and cereal. Skipped session for therapy for now and resting in bed with lft leg elevated. Margarito Liner

## 2013-10-15 NOTE — Progress Notes (Signed)
Subjective/Complaints: Swelling is better. Pain overall improved. Still "wants" to put foot down but knows he's not supposed to A 12 point review of systems has been performed and if not noted above is otherwise negative.   Objective: Vital Signs: Blood pressure 148/75, pulse 68, temperature 99.2 F (37.3 C), temperature source Oral, resp. rate 18, height 6\' 1"  (1.854 m), weight 102.1 kg (225 lb 1.4 oz), SpO2 98.00%. No results found. No results found for this basename: WBC, HGB, HCT, PLT,  in the last 72 hours No results found for this basename: NA, K, CL, CO, GLUCOSE, BUN, CREATININE, CALCIUM,  in the last 72 hours CBG (last 3)   Recent Labs  10/14/13 1655 10/14/13 2035 10/15/13 0732  GLUCAP 153* 137* 216*    Wt Readings from Last 3 Encounters:  10/09/13 102.1 kg (225 lb 1.4 oz)  09/26/13 93.441 kg (206 lb)  09/26/13 93.441 kg (206 lb)    Physical Exam:  Constitutional: He is oriented to person, place, and time.  HENT:  Head: Normocephalic.  Eyes: EOM are normal.  Neck: Normal range of motion. Neck supple. No thyromegaly present.  Cardiovascular: Normal rate and regular rhythm.  Respiratory: Effort normal and breath sounds normal. No respiratory distress.  GI: Soft. Bowel sounds are normal. He exhibits no distension.  Musculoskeletal:  2+ LE edema  Neurological: He is alert and oriented to person, place, and time.  UE's 5/5. RLE 4- HF, KE, Ankle. LLE is 1+ HF and 2- distally. Difficult to test. Distal sensory loss left greater than right legs.  Skin: Skin graft site clean and dry with Xeroform/dressing in place  Left foot with bulky dressing, graft site clean with some drainage. and appropriately tender. Right foot with some ischemic changes but with palpable pedal pulses  Uro: scrotum improving.  Psychiatric: He has a normal mood and affect. His behavior is normal. Judgment and thought content normal    Assessment/Plan: 1. Functional deficits secondary  to necrotizing fasciitis left foot/leg which require 3+ hours per day of interdisciplinary therapy in a comprehensive inpatient rehab setting. Physiatrist is providing close team supervision and 24 hour management of active medical problems listed below. Physiatrist and rehab team continue to assess barriers to discharge/monitor patient progress toward functional and medical goals. FIM: FIM - Bathing Bathing Steps Patient Completed: Chest;Right Arm;Left Arm;Abdomen;Front perineal area;Buttocks;Right upper leg;Right lower leg (including foot);Left upper leg Bathing: 4: Steadying assist  FIM - Upper Body Dressing/Undressing Upper body dressing/undressing steps patient completed: Thread/unthread right sleeve of pullover shirt/dresss;Thread/unthread left sleeve of pullover shirt/dress;Put head through opening of pull over shirt/dress;Pull shirt over trunk Upper body dressing/undressing: 5: Supervision: Safety issues/verbal cues FIM - Lower Body Dressing/Undressing Lower body dressing/undressing steps patient completed: Thread/unthread left underwear leg;Thread/unthread right underwear leg;Pull underwear up/down;Thread/unthread right pants leg;Thread/unthread left pants leg;Pull pants up/down;Don/Doff right sock Lower body dressing/undressing: 4: Steadying Assist  FIM - Toileting Toileting steps completed by patient: Adjust clothing prior to toileting;Performs perineal hygiene;Adjust clothing after toileting Toileting Assistive Devices: Grab bar or rail for support Toileting: 4: Steadying assist  FIM - Radio producer Devices: Mining engineer Transfers: 4-To toilet/BSC: Min A (steadying Pt. > 75%);4-From toilet/BSC: Min A (steadying Pt. > 75%)  FIM - Control and instrumentation engineer Devices: Walker;Arm rests Bed/Chair Transfer: 5: Chair or W/C > Bed: Supervision (verbal cues/safety issues);5: Bed > Chair or W/C: Supervision (verbal cues/safety  issues)  FIM - Locomotion: Wheelchair Distance: 175 Locomotion: Wheelchair:  6: Travels 150 ft or more, turns around, maneuvers to table, bed or toilet, negotiates 3% grade: maneuvers on rugs and over door sills independently FIM - Locomotion: Ambulation Locomotion: Ambulation Assistive Devices: Administrator Ambulation/Gait Assistance: 4: Min guard Locomotion: Ambulation: 1: Travels less than 50 ft with supervision/safety issues  Comprehension Comprehension Mode: Auditory Comprehension: 7-Follows complex conversation/direction: With no assist  Expression Expression Mode: Verbal Expression: 6-Expresses complex ideas: With extra time/assistive device  Social Interaction Social Interaction: 7-Interacts appropriately with others - No medications needed.  Problem Solving Problem Solving: 6-Solves complex problems: With extra time  Memory Memory: 7-Complete Independence: No helper  Medical Problem List and Plan:  1. Necrotizing fasciitis left foot/leg. Status post toe amputation/irrigation and debridement and split thickness skin graft  -ortho follow up for graft site  2. DVT Prophylaxis/Anticoagulation: Subcutaneous Lovenox. Monitor platelet counts and any signs of bleeding  3. Pain Management: Oxycodone as needed as well as baclofen 10 mg 3 times daily as needed muscle spasms hiccups. Monitor with increased mobility   -scheduled oxycodone 10mg  bid with therapies in addition to prn 4. Neuropsych: This patient is capable of making decisions on his own behalf.  5. ID/wound. Zosyn and vancomycin discontinued changed to oral doxycycline x2 weeks  -diflucan for scrotal fungal infection--continue  -left ankle/foot wounds quite dry including graft. Will discuss with ortho---probably needs something which maintains some moisture or perhaps a silicone dressing. 6. Diabetes mellitus with poor medical compliance. Hemoglobin A1c 13.7. NovoLog 3 units 3 times a day. Check blood sugars a.c. and  at bedtime. Improved control as a whole 7. Hypertension. Lisinopril 20 mg daily. Monitor with increased mobility  8. Acute on chronic anemia. Continue iron supplement. Followup CBC   LOS (Days) 6 A FACE TO FACE EVALUATION WAS PERFORMED  SWARTZ,ZACHARY T 10/15/2013 8:04 AM

## 2013-10-15 NOTE — Progress Notes (Signed)
Occupational Therapy Session Note  Patient Details  Name: Mark [name] MRN: [id number] Date of Birth: Jul 21, 1951  Today's Date: 10/15/2013 Time: 0700-0800 Time Calculation (min): 60 min  Short Term Goals: Week 1:  OT Short Term Goal 1 (Week 1): STG=LTG  Skilled Therapeutic Interventions/Progress Updates:    1:1 self care retraining/ home management/ dynamic standing/ w/c management: pt supine on arrival and complete bed mobility to EOB Mod I. Pt transferred with RW to sink level and w/c pushed in behind patient. MD Naaman Plummer arriving during session and verbalized okay to shower next session with LT LE foot covered to keep dry. Pt completed adl at sink level for this session. Pt without clean clothing at this time so x2 gowns provided. OT washing and drying clothes after session. Pt transferred in w/c to ADL kitchen. Pt retrieving food (dynamic standing balance) from placing in walker basket and transferring food to table height. Pt transferred to slow armless chair height. Pt need extended time to complete sit<>Stand. Pt self propelled w/c back to room. Pt with clothing returned to room around 12 noon.  Therapy Documentation Precautions:  Precautions Precautions: Fall Precaution Comments: xeoform on Lt thigh at donor site to remain until falling off Restrictions Weight Bearing Restrictions: Yes LLE Weight Bearing: Non weight bearing General: General Missed Time Reason: Patient ill (comment) (Low blood sugar; patient not feeling well) Vital Signs: Therapy Vitals Temp: 98 F (36.7 C) Temp src: Oral Pulse Rate: 74 BP: 124/75 mmHg Oxygen Therapy SpO2: 99 % O2 Device: None (Room air) Pain: Pain Assessment Reports "feels good to move" Pt states "I was cold last night couldn't get warm" ADL: ADL ADL Comments: See FIM scores  See FIM for current functional status  Therapy/Group: Individual Therapy  Peri Maris 10/15/2013, 12:18 PM Pager: [phone]

## 2013-10-15 NOTE — Progress Notes (Signed)
Physical Therapy Session Note  Patient Details  Name: Mark [name] MRN: [id number] Date of Birth: 1951-05-09  Today's Date: 10/15/2013 Time: 0830-0930 Time Calculation (min): 60 min  Short Term Goals: Week 1:  PT Short Term Goal 1 (Week 1): STGs=LTGs secondary to LOS  Skilled Therapeutic Interventions/Progress Updates:    Patient received sitting in recliner. Session focused on wheelchair mobility, functional transfers, gait training, furniture transfers, and curb negotiation (to simulate STE home). Wheelchair mobility 150' x2 with B UE in controlled and home environments (ADL apartment on carpet) with mod I. Furniture transfer from low, cushioned sofa (one arm rest only) with supervision (verbal cues for optimal hand placement). Gait training 1' x1 (10' in home environment on carpet) with RW and supervision. Curb negotiation x2 trials with RW and close supervision, ascends backwards, descends forwards. Patient demonstrates good safety and proper sequencing/technique. Patient reporting dizziness due to pain meds. Remainder of session, performed B LE strengthening exercises: LAQ with 3" hold, 2x10 each. Patient returned to room and left sitting in wheelchair with all needs within reach.  Therapy Documentation Precautions:  Precautions Precautions: Fall Restrictions Weight Bearing Restrictions: Yes LLE Weight Bearing: Non weight bearing Pain: Pain Assessment Pain Assessment: 0-10 Pain Score: 6  Pain Type: Surgical pain Pain Location: Leg Pain Orientation: Left Pain Descriptors / Indicators: Aching;Sore Pain Onset: On-going Pain Intervention(s): Repositioned;RN made aware;Ambulation/increased activity Multiple Pain Sites: No  See FIM for current functional status  Therapy/Group: Individual Therapy  Lillia Abed. Fiore Detjen, PT, DPT 10/15/2013, 10:19 AM

## 2013-10-15 NOTE — Progress Notes (Signed)
Physical Therapy Note  Patient Details  Name: Mark [name] MRN: [id number] Date of Birth: [birth year] Today's Date: 10/15/2013  Patient missed 45 minutes of skilled physical therapy this AM secondary to feeling ill due to "low blood sugar." Spoke with patient's RN and she reported that blood glucose level was 94, which is low for patient based on his baseline glucose levels. Patient reports not feeling well and feeling groggy. Patient left sitting in wheelchair consuming snack. Will follow up as able.   [place] Sierah Lacewell, PT, DPT 10/15/2013, 11:13 AM

## 2013-10-15 NOTE — Progress Notes (Signed)
Physical Therapy Session Note  Patient Details  Name: Mark [name] MRN: [id number] Date of Birth: January 31, 1951  Today's Date: 10/15/2013 Time: 1130-1200 Time Calculation (min): 30 min  Skilled Therapeutic Interventions/Progress Updates:  1:1. Pt received supine in bed, nurse tech present- CBG 121. Pt stated feeling better and willing to participate as able. Pt req overall (S) this session for bed mobility, SPT w/ RW bed>w/c<>car. Pt w/ difficulty t/f to lower anticipated height of car that he will d/c in, elevated for training purposes. W/c propulsion room<>ortho gym for emphasis on B UE strength/endurance. Pt req min A for management of leg rests on/off w/c. Pt sitting in recliner at end of session w/ all needs in reach.   Therapy Documentation Precautions:  Precautions Precautions: Fall Precaution Comments: xeoform on Lt thigh at donor site to remain until falling off Restrictions Weight Bearing Restrictions: Yes LLE Weight Bearing: Non weight bearing  See FIM for current functional status  Therapy/Group: Individual Therapy  Gilmore Laroche 10/15/2013, 12:04 PM

## 2013-10-16 ENCOUNTER — Inpatient Hospital Stay (HOSPITAL_COMMUNITY): Payer: Medicaid Other | Admitting: *Deleted

## 2013-10-16 ENCOUNTER — Inpatient Hospital Stay (HOSPITAL_COMMUNITY): Payer: Self-pay | Admitting: Occupational Therapy

## 2013-10-16 ENCOUNTER — Inpatient Hospital Stay (HOSPITAL_COMMUNITY): Payer: Medicaid Other | Admitting: Occupational Therapy

## 2013-10-16 LAB — GLUCOSE, CAPILLARY
Glucose-Capillary: 177 mg/dL — ABNORMAL HIGH (ref 70–99)
Glucose-Capillary: 226 mg/dL — ABNORMAL HIGH (ref 70–99)
Glucose-Capillary: 120 mg/dL — ABNORMAL HIGH (ref 70–99)
Glucose-Capillary: 203 mg/dL — ABNORMAL HIGH (ref 70–99)
Glucose-Capillary: 119 mg/dL — ABNORMAL HIGH (ref 70–99)

## 2013-10-16 LAB — CREATININE, SERUM
Creatinine, Ser: 1.17 mg/dL (ref 0.50–1.35)
GFR calc Af Amer: 75 mL/min — ABNORMAL LOW (ref >90)
GFR calc non Af Amer: 65 mL/min — ABNORMAL LOW (ref >90)

## 2013-10-16 NOTE — Progress Notes (Signed)
Physical Therapy Discharge Summary  Patient Details  Name: Mark [name] MRN: [id number] Date of Birth: 10-25-1950  Today's Date: 10/16/2013 Time: 9163-8466 and 5993-5701 Time Calculation (min): 56 min and 42 min  Patient has met 11 of 11 long term goals due to improved activity tolerance, improved balance, improved postural control, increased strength, decreased pain, ability to compensate for deficits, functional use of  right upper extremity, right lower extremity and left lower extremity and improved coordination.  Patient to discharge at a wheelchair level Modified Independent in the community, ambulatory level mod I for household mobility.   Patient's care partner not necessary to undergo family training/educaiton sessions secondary to patient discharging at mod I level and independent for direction of care at discharge.  Reasons goals not met: N/A, all LTGs met  Recommendation:  Patient will benefit from ongoing skilled PT services in home health setting to continue to advance safe functional mobility, address ongoing impairments in activity tolerance, balance, coordination, strength, gait, and minimize fall risk.  Equipment: 18x18 wheelchair with elevating leg rests and RW  Reasons for discharge: treatment goals met and discharge from hospital  Patient/family agrees with progress made and goals achieved: Yes  Skilled Interventions: Sessions today focused on functional mobility in preparation for discharge home tomorrow; see details below. Patient performs car transfer from/to wheelchair with RW and set-up assist for management of RW and wheelchair in/out of car. Patient performs furniture transfers wheelchair to/from low, cushioned surfaces with less than 2 arm rests with mod I. Patient made mod I in room, but encouraged to call if he needed assistance.  PT Discharge Precautions/Restrictions Precautions Precautions: Fall Restrictions Weight Bearing Restrictions: Yes LLE  Weight Bearing: Non weight bearing Pain Pain Assessment Pain Assessment: 0-10 Pain Score: 6  Pain Location: Leg Pain Orientation: Left Pain Onset: On-going Vision/Perception  Vision - History Baseline Vision: No visual deficits Patient Visual Report: No change from baseline Vision - Assessment Eye Alignment: Within Functional Limits Perception Perception: Within Functional Limits Praxis Praxis: Intact  Cognition Overall Cognitive Status: Within Functional Limits for tasks assessed Arousal/Alertness: Awake/alert Orientation Level: Oriented X4 Memory: Appears intact Awareness: Appears intact Problem Solving: Appears intact Safety/Judgment: Appears intact Sensation Sensation Light Touch: Appears Intact Proprioception: Appears Intact Additional Comments: Sensation intact, but patient does report intermittent numbness and tingling in B LEs. Coordination Gross Motor Movements are Fluid and Coordinated: Yes Fine Motor Movements are Fluid and Coordinated: Yes Motor  Motor Motor: Within Functional Limits Motor - Discharge Observations: improved strength overall  Mobility Bed Mobility Bed Mobility: Supine to Sit;Sit to Supine;Sitting - Scoot to Edge of Bed Supine to Sit: HOB flat;6: Modified independent (Device/Increase time) Sitting - Scoot to Edge of Bed: 6: Modified independent (Device/Increase time) Sit to Supine: 6: Modified independent (Device/Increase time);HOB flat Transfers Transfers: Yes Sit to Stand: 6: Modified independent (Device/Increase time);With armrests;With upper extremity assist;From bed;From chair/3-in-1 Stand to Sit: With upper extremity assist;To chair/3-in-1;6: Modified independent (Device/Increase time);With armrests;To bed Stand Pivot Transfers: 6: Modified independent (Device/Increase time);With armrests (w/ RW) Squat Pivot Transfers: 6: Modified independent (Device/Increase time);With upper extremity assistance;With armrests Locomotion   Ambulation Ambulation: Yes Ambulation/Gait Assistance: 6: Modified independent (Device/Increase time) Ambulation Distance (Feet): 51 Feet Assistive device: Rolling walker Ambulation/Gait Assistance Details: Functional ambulation 45' x1 in controlled environment with RW and mod I, 69' x1 in home environment (ADL apartment on carpet) with RW and mod I. Gait Gait: Yes Gait Pattern: Impaired Gait Pattern: Decreased stride length;Step-to pattern;Decreased step length - right Stairs /  Additional Locomotion Stairs: Yes Stairs Assistance: 5: Supervision Stairs Assistance Details (indicate cue type and reason): Patient negotiated curb x2 to simulate one step up onto porch then another step into house. Stair Management Technique: No rails;Backwards;Forwards;With walker (ascends backwards, descends forwards) Number of Stairs: 2 Height of Stairs: 6 Ramp: 6: Modified independent (in w/c; ascends backwards, descends forwards; uses B UE and R LE) Curb: 5: Supervision (w/ RW) Product manager Mobility: Yes Wheelchair Assistance: 6: Modified independent (Device/Increase time) Environmental health practitioner: Both upper extremities Wheelchair Parts Management: Independent Distance: 150 in controlled environment, 25' in home environment (in ADL apartment), and >300' in community environment (on/off elevators, in hospital lobby); Modified Independent Trunk/Postural Assessment  Cervical Assessment Cervical Assessment: Within Functional Limits Thoracic Assessment Thoracic Assessment: Within Functional Limits Lumbar Assessment Lumbar Assessment: Within Functional Limits Postural Control Postural Control: Within Functional Limits  Balance Balance Balance Assessed: Yes Static Sitting Balance Static Sitting - Balance Support: Feet supported;No upper extremity supported Static Sitting - Level of Assistance: 6: Modified independent (Device/Increase time) Dynamic Sitting Balance Dynamic Sitting -  Balance Support: Feet supported;During functional activity Dynamic Sitting - Level of Assistance: 6: Modified independent (Device/Increase time) Static Standing Balance Static Standing - Balance Support: Bilateral upper extremity supported;During functional activity Static Standing - Level of Assistance: 6: Modified independent (Device/Increase time) Extremity Assessment  RLE Assessment RLE Assessment: Within Functional Limits (Grossly 4+/5) LLE Assessment LLE Assessment: Exceptions to [facility] LLE Strength LLE Overall Strength: Deficits;Due to pain LLE Overall Strength Comments: Functionally 3/5  See FIM for current functional status  Deondray Ospina S Breyon Blass S. Kayli Beal, PT, DPT 10/16/2013, 9:30 AM

## 2013-10-16 NOTE — Discharge Summary (Signed)
Mark [name], CALMA NO.:  [id number]  MEDICAL RECORD NO.:  [id number]  LOCATION:  4M06C                        FACILITY:  [facility]  PHYSICIAN:  Lauraine Rinne, P.A.  DATE OF BIRTH:  Feb 24, 1951  DATE OF ADMISSION:  10/09/2013 DATE OF DISCHARGE:  10/17/2013                              DISCHARGE SUMMARY   DISCHARGE DIAGNOSES: 1. Necrotizing fasciitis left leg status post toe amputation with     irrigation, debridement, and split-thickness skin graft 2. Subcutaneous Lovenox for DVT prophylaxis, pain management, diabetes     mellitus, poor medical compliance, hypertension, acute on chronic     anemia.  HISTORY OF PRESENT ILLNESS:  This is a 63 year old right-handed male history of poorly-controlled diabetes mellitus, peripheral neuropathy and poor medical compliance, was admitted September 26, 2013 with 2-week history of left foot pain, swelling and erythema.  The patient reported low-grade fever of 102.7 as well as chills.  Noted white count of 26,800 and glucose of 359.  MRI of left foot showed intense cellulitis of left foot with multiple abscess identified.  Innumerable nodules of gas within soft tissue worrisome for necrotizing fasciitis.  Placed on broad- spectrum antibiotics.  Orthopedic Services, Dr. Ninfa Linden consulted, underwent extensive irrigation and debridement left foot as well as amputation of left third toe through the proximal metatarsal October 03, 2013, followed by irrigation and debridement, split-thickness skin graft October 05, 2013.  A VAC remained in place until October 08, 2013, nonweightbearing left foot.  Subcutaneous Lovenox for DVT prophylaxis. Pain management as directed, initially placed on vancomycin and Zosyn, changed to oral doxycycline x2 weeks.  Physical and occupational therapy ongoing.  The patient was admitted for comprehensive rehab program.  PAST MEDICAL HISTORY:  See discharge diagnoses.  SOCIAL HISTORY:  Lives with  spouse.  Functional history prior to admission independent.  Functional status upon admission to rehab services was ambulating 45 feet, requiring extra time due to deconditioning, needing some cues to maintain nonweightbearing status.  PHYSICAL EXAMINATION:  VITAL SIGNS:  Blood pressure 145/84, pulse 69, temperature 98, respirations 17. GENERAL:  This was an alert male, oriented x3. HEENT:  Pupils round and reactive to light. LUNGS:  Clear to auscultation. CARDIAC:  Regular rate and rhythm. ABDOMEN:  Soft, nontender.  Good bowel sounds. EXTREMITIES:  Left foot with a bulky dressing, appropriately tender right foot with some ischemic changes and good palpable pulses.  REHABILITATION HOSPITAL COURSE:  The patient was admitted to Inpatient Rehab Services with therapies initiated on a 3-hour daily basis consisting of physical therapy, occupational therapy, and rehabilitation nursing.  The following issues were addressed during the patient's rehabilitation stay.  Pertaining to Mr. Denice Bors necrotizing fasciitis of left foot and leg, he had undergone toe amputation, irrigation and debridement with split-thickness skin graft.  Surgical site healing nicely at recommendations of Orthopedic Services, Dr. Ninfa Linden.  He remained on subcutaneous Lovenox for DVT prophylaxis.  Pain management with the use of OxyContin sustained release 10 mg twice daily as well as oxycodone for breakthrough pain.  He remained on doxycycline x2 week duration after initially being placed on Zosyn and vancomycin remaining afebrile.  He did have a history of diabetes mellitus, peripheral neuropathy.  Hemoglobin A1c of 13.7, with poor medical compliance,  He received full diabetic education.  It was questionable if he would be compliant with his current diabetic restrictions.  Blood pressures remained controlled on lisinopril with no orthostatic changes.  Acute on chronic anemia with iron supplement as advised.  Latest  hemoglobin of 9.4.  The patient received weekly collaborative Interdisciplinary team conferences to discuss estimated length of stay, family teaching, and any barriers to discharge.  The patient is supine in bed, independent with bed mobility.  Straight point cane with rolling walker, bed to wheelchair to car, wheelchair propulsion, independent, required minimal assist for management of leg rests, transfer rolling walker to sink level for activities of daily living with modified independence. Completed activities of daily living at sink level for sessions.  Full family teaching was completed.  Plan was to be discharged to home.  DISCHARGE MEDICATIONS: 1. Baclofen 10 mg p.o. t.i.d. as needed muscle spasms. 2. Doxycycline 100 mg p.o. every 12 hours to complete a 2 week course. 3. Fergon 324 mg p.o. b.i.d. 4. Insulin NovoLog 3 units t.i.d. 5. Lisinopril 20 mg p.o. daily. 6. OxyContin immediate release 10 mg b.i.d. and 5 mg every 4 hours as     needed moderate pain, dispensed of 90 tablets.  DIET:  Diabetic diet.  SPECIAL INSTRUCTIONS:  Nonweightbearing.  Dressing changes Xeroform in place over left thigh donor site.  It would eventually fall off on its own as the donor site heals and follow up per Dr. Ninfa Linden.  Follow up Dr. Alger Simons at the outpatient rehab service office as directed. Case manager to make arrangements to follow up with PCP.     Lauraine Rinne, P.A.     DA/MEDQ  D:  10/16/2013  T:  10/16/2013  Job:  [id number]  cc:   Lind Guest. Ninfa Linden, M.D.

## 2013-10-16 NOTE — Discharge Summary (Signed)
Discharge summary job # [id number]

## 2013-10-16 NOTE — Progress Notes (Signed)
Occupational Therapy Discharge Summary  Patient Details  Name: Mark [name] MRN: [id number] Date of Birth: 06/04/1951  Today's Date: 10/16/2013 Time: 1130-1200 Time Calculation (min): 30 min  Patient has met 6 of 6 long term goals due to improved activity tolerance, improved balance and ability to compensate for deficits.  Patient to discharge at overall Modified Independent level.  Patient's care partner min guard  to provide the necessary physical assistance at discharge.      Recommendation:  Patient will benefit from ongoing skilled OT services in home health setting to continue to advance functional skills in the area of BADL. Home health RN to address Lt foot wound. Recommend tub bench, RW, w/c and 3n1 for d/c home. Pt agreeable to all equipment. Pt requires 3n1 for BIL UE to progress off standard commode. Pt educated 10/16/2013 On use of trash bags for covering LT foot wound to prevent from getting wet. Pt able to don x2 bags tape and removed at end of simulated tub bench transfer. Pt will have wife (A) if needed upon d/c.   Equipment: 3n1 Tub bench RW w/c with elevated LT Le rest  Reasons for discharge: treatment goals met and discharge from hospital  Patient/family agrees with progress made and goals achieved: Yes  First session 7:00-8:00- self care retraining/ use of w/c and RW for dynamic balance-  Pt completed bed mobility, gathered all adl items and clothing. Pt complete bath at sink level this AM due to initial plan at home is to sink level bath. Pt completed toilet transfer mod I and managed the door easily. Pt returning all adl items at end of session. Pt ending session in w/c and independent breakfast tray. Second session 11:30-12:00- tub bench / wound care management for showering: pt educated on the use of x2 bags to cover Lt LE to prevent getting wet. Pt demonstrates ability to cross BIL Le and hip flexion to tape bags. Pt completed tub bench transfer. Pt plans to sit  with bench facing shower head backwards due to no hand held shower head. Pt educated on placing shower curtain to prevent water in bathroom floor and prevent water on Lt foot. Pt transferred back to w/c. Pt self propelled w/c to and from room this session. Pt transferred from w/c to recliner for lunch at end of session. All education is complete and patient indicates understanding.   OT Discharge Precautions/Restrictions  Precautions Precautions: Fall Precaution Comments: xeoform on Lt thigh at donor site to remain until falling off Restrictions Weight Bearing Restrictions: Yes LLE Weight Bearing: Non weight bearing General   Vital Signs Therapy Vitals Pulse Rate: 64 BP: 116/64 mmHg Oxygen Therapy SpO2: 99 % Pain Pain Assessment Pain Assessment: 0-10 Pain Score: 6  Pain Location: Leg Pain Orientation: Left Pain Radiating Towards: foot Pain Intervention(s): Medication (See eMAR) ADL ADL ADL Comments: see FIM scores Vision/Perception  Vision - History Baseline Vision: No visual deficits Patient Visual Report: No change from baseline Vision - Assessment Eye Alignment: Within Functional Limits Perception Perception: Within Functional Limits Praxis Praxis: Intact  Cognition Overall Cognitive Status: Within Functional Limits for tasks assessed Arousal/Alertness: Awake/alert Orientation Level: Oriented X4 Memory: Appears intact Awareness: Appears intact Problem Solving: Appears intact Safety/Judgment: Appears intact Sensation Sensation Light Touch: Appears Intact Proprioception: Appears Intact Additional Comments: Sensation intact, but patient does report intermittent numbness and tingling in B LEs. Coordination Gross Motor Movements are Fluid and Coordinated: Yes Fine Motor Movements are Fluid and Coordinated: Yes Motor  Motor Motor: Within  Functional Limits Motor - Discharge Observations: improved strength overall Mobility  Bed Mobility Bed Mobility: Supine to  Sit;Sitting - Scoot to Edge of Bed Supine to Sit: 6: Modified independent (Device/Increase time);HOB flat Sitting - Scoot to Edge of Bed: 6: Modified independent (Device/Increase time) Sit to Supine: 6: Modified independent (Device/Increase time);HOB flat Transfers Sit to Stand: 6: Modified independent (Device/Increase time);From bed;From chair/3-in-1;From toilet;With upper extremity assist Stand to Sit: 6: Modified independent (Device/Increase time);With upper extremity assist;To bed;To chair/3-in-1;To toilet  Trunk/Postural Assessment  Cervical Assessment Cervical Assessment: Within Functional Limits Thoracic Assessment Thoracic Assessment: Within Functional Limits Lumbar Assessment Lumbar Assessment: Within Functional Limits Postural Control Postural Control: Within Functional Limits  Balance Balance Balance Assessed: Yes Static Sitting Balance Static Sitting - Balance Support: No upper extremity supported;Feet supported Static Sitting - Level of Assistance: 6: Modified independent (Device/Increase time) Dynamic Sitting Balance Dynamic Sitting - Balance Support: Feet supported;No upper extremity supported;During functional activity Dynamic Sitting - Level of Assistance: 6: Modified independent (Device/Increase time) Static Standing Balance Static Standing - Balance Support: Bilateral upper extremity supported;During functional activity Static Standing - Level of Assistance: 6: Modified independent (Device/Increase time) Extremity/Trunk Assessment RUE Assessment RUE Assessment: Within Functional Limits LUE Assessment LUE Assessment: Within Functional Limits  See FIM for current functional status  Peri Maris 10/16/2013, 12:05 PM Pager: [phone]

## 2013-10-16 NOTE — Progress Notes (Signed)
Subjective/Complaints: Mod I in room. Concerned about safety getting into house with snow. A 12 point review of systems has been performed and if not noted above is otherwise negative.   Objective: Vital Signs: Blood pressure 144/73, pulse 70, temperature 98.6 F (37 C), temperature source Oral, resp. rate 17, height 6\' 1"  (1.854 m), weight 102.1 kg (225 lb 1.4 oz), SpO2 97.00%. No results found. No results found for this basename: WBC, HGB, HCT, PLT,  in the last 72 hours  Recent Labs  10/16/13 0542  CREATININE 1.17   CBG (last 3)   Recent Labs  10/15/13 1700 10/15/13 2150 10/16/13 0755  GLUCAP 204* 177* 226*    Wt Readings from Last 3 Encounters:  10/09/13 102.1 kg (225 lb 1.4 oz)  09/26/13 93.441 kg (206 lb)  09/26/13 93.441 kg (206 lb)    Physical Exam:  Constitutional: He is oriented to person, place, and time.  HENT:  Head: Normocephalic.  Eyes: EOM are normal.  Neck: Normal range of motion. Neck supple. No thyromegaly present.  Cardiovascular: Normal rate and regular rhythm.  Respiratory: Effort normal and breath sounds normal. No respiratory distress.  GI: Soft. Bowel sounds are normal. He exhibits no distension.  Musculoskeletal:  2+ LE edema  Neurological: He is alert and oriented to person, place, and time.  UE's 5/5. RLE 4- HF, KE, Ankle. LLE is 1+ HF and 2- distally. Difficult to test. Distal sensory loss left greater than right legs.  Skin: Skin graft site clean and dry with Xeroform/dressing in place  Left foot with bulky dressing, graft site clean with some drainage. and appropriately tender. Right foot with some ischemic changes but with palpable pedal pulses  Uro: scrotum improving.  Psychiatric: He has a normal mood and affect. His behavior is normal. Judgment and thought content normal    Assessment/Plan: 1. Functional deficits secondary to necrotizing fasciitis left foot/leg which require 3+ hours per day of interdisciplinary  therapy in a comprehensive inpatient rehab setting. Physiatrist is providing close team supervision and 24 hour management of active medical problems listed below. Physiatrist and rehab team continue to assess barriers to discharge/monitor patient progress toward functional and medical goals. FIM: FIM - Bathing Bathing Steps Patient Completed: Chest;Right Arm;Left Arm;Abdomen;Front perineal area;Buttocks;Right upper leg;Left upper leg;Right lower leg (including foot) Bathing: 7: Complete Independence: No helper (omit LT foot due to wound/ surg site)  FIM - Upper Body Dressing/Undressing Upper body dressing/undressing steps patient completed: Thread/unthread right sleeve of pullover shirt/dresss;Thread/unthread left sleeve of pullover shirt/dress;Put head through opening of pull over shirt/dress;Pull shirt over trunk Upper body dressing/undressing: 5: Supervision: Safety issues/verbal cues FIM - Lower Body Dressing/Undressing Lower body dressing/undressing steps patient completed: Don/Doff right sock;Pull pants up/down;Thread/unthread left pants leg;Thread/unthread right pants leg;Pull underwear up/down;Thread/unthread left underwear leg;Thread/unthread right underwear leg Lower body dressing/undressing: 7: Complete Independence: No helper (using RW for sit<>Stand)  FIM - Toileting Toileting steps completed by patient: Adjust clothing prior to toileting;Performs perineal hygiene;Adjust clothing after toileting Toileting Assistive Devices: Grab bar or rail for support Toileting: 7: Independent: No helper, no device (3n1 over toilet)  FIM - Radio producer Devices:  (3n1 over toilet) Toilet Transfers: 7-Independent: No helper;6-Assistive device: No helper  FIM - Control and instrumentation engineer Devices: Walker;Arm rests Bed/Chair Transfer: 6: Assistive device: no helper;6: More than reasonable amt of time;6: Supine > Sit: No assist;6: Sit > Supine: No  assist;6: Chair or W/C > Bed: No assist;6:  Bed > Chair or W/C: No assist  FIM - Locomotion: Wheelchair Distance: 150 Locomotion: Wheelchair: 6: Travels 150 ft or more, turns around, maneuvers to table, bed or toilet, negotiates 3% grade: maneuvers on rugs and over door sills independently FIM - Locomotion: Ambulation Locomotion: Ambulation Assistive Devices: Administrator Ambulation/Gait Assistance: 6: Modified independent (Device/Increase time) Locomotion: Ambulation: 5: Household Independent - travels 62 - 149 ft independent or modified independent  Comprehension Comprehension Mode: Auditory Comprehension: 7-Follows complex conversation/direction: With no assist  Expression Expression Mode: Verbal Expression: 6-Expresses complex ideas: With extra time/assistive device  Social Interaction Social Interaction: 7-Interacts appropriately with others - No medications needed.  Problem Solving Problem Solving: 6-Solves complex problems: With extra time  Memory Memory: 7-Complete Independence: No helper  Medical Problem List and Plan:  1. Necrotizing fasciitis left foot/leg. Status post toe amputation/irrigation and debridement and split thickness skin graft  -ortho follow up for graft site  2. DVT Prophylaxis/Anticoagulation: Subcutaneous Lovenox. Monitor platelet counts and any signs of bleeding  3. Pain Management: Oxycodone as needed as well as baclofen 10 mg 3 times daily as needed muscle spasms hiccups. Monitor with increased mobility   -scheduled oxycodone 10mg  bid with therapies   -overall pain much improved 4. Neuropsych: This patient is capable of making decisions on his own behalf.  5. ID/wound. Zosyn and vancomycin discontinued changed to oral doxycycline x2 weeks  -diflucan for scrotal fungal infection--can dc  -left ankle/foot wounds quite dry including graft. Ortho wants same dressing for now---outpt follow up 6. Diabetes mellitus with poor medical compliance.  Hemoglobin A1c 13.7. NovoLog 3 units 3 times a day. Check blood sugars a.c. and at bedtime. Improved control as a whole 7. Hypertension. Lisinopril 20 mg daily. Monitor with increased mobility  8. Acute on chronic anemia. Continue iron supplement. Followup CBC   LOS (Days) 7 A FACE TO FACE EVALUATION WAS PERFORMED  SWARTZ,ZACHARY T 10/16/2013 10:14 AM

## 2013-10-17 LAB — GLUCOSE, CAPILLARY
Glucose-Capillary: 203 mg/dL — ABNORMAL HIGH (ref 70–99)
Glucose-Capillary: 120 mg/dL — ABNORMAL HIGH (ref 70–99)

## 2013-10-17 MED ORDER — OXYCODONE HCL 5 MG PO TABS: 5.0000 mg | ORAL_TABLET | ORAL | Status: DC | PRN

## 2013-10-17 MED ORDER — BACLOFEN 10 MG PO TABS: 10.0000 mg | ORAL_TABLET | Freq: Three times a day (TID) | ORAL | Status: DC | PRN

## 2013-10-17 MED ORDER — INSULIN ASPART 100 UNIT/ML ~~LOC~~ SOLN: 3.0000 [IU] | Freq: Three times a day (TID) | SUBCUTANEOUS | Status: DC

## 2013-10-17 MED ORDER — LISINOPRIL 20 MG PO TABS: 20.0000 mg | ORAL_TABLET | Freq: Every day | ORAL | Status: DC

## 2013-10-17 MED ORDER — FERROUS GLUCONATE 324 (38 FE) MG PO TABS: 324.0000 mg | ORAL_TABLET | Freq: Two times a day (BID) | ORAL | Status: DC

## 2013-10-17 NOTE — Progress Notes (Signed)
Patient discharged home.  Left floor via wheelchair, escorted by nursing staff and spouse.  All equipment delivered to room and taken with patient.  Patient and spouse verbalized understanding of discharge instructions.  Spouse verbalized understanding of wound care/ dressing changes.  All patient belongings taken with patient.  VSS.  Appears to be in no immediate distress at this time.  Brita Romp, RN

## 2013-10-17 NOTE — Discharge Instructions (Signed)
Inpatient Rehab Discharge Instructions  [facility] Discharge date and time: No discharge date for patient encounter.   Activities/Precautions/ Functional Status: Activity: Nonweightbearing left leg Diet: diabetic diet Wound Care: keep wound clean and dry Functional status:  ___ No restrictions     ___ Walk up steps independently _x__ 24/7 supervision/assistance   ___ Walk up steps with assistance ___ Intermittent supervision/assistance  ___ Bathe/dress independently ___ Walk with walker     ___ Bathe/dress with assistance ___ Walk Independently    ___ Shower independently ___ Walk with assistance    ___ Shower with assistance ___ No alcohol     ___ Return to work/school ________    COMMUNITY REFERRALS UPON DISCHARGE:    Home Health:   PT      RN   SW                Agency: [place] Phone: [phone]   Medical Equipment/Items Ordered: wheelchair, cushion, walker, 3n1 commode and tub bench                                                    Agency/Supplier: [facility]        Special Instructions:    My questions have been answered and I understand these instructions. I will adhere to these goals and the provided educational materials after my discharge from the hospital.  Patient/Caregiver Signature _______________________________ Date __________  Clinician Signature _______________________________________ Date __________  Please bring this form and your medication list with you to all your follow-up doctor's appointments.

## 2013-10-17 NOTE — Progress Notes (Signed)
Subjective/Complaints: Mod I in room. Up in wheelchair getting cleaned up A 12 point review of systems has been performed and if not noted above is otherwise negative.   Objective: Vital Signs: Blood pressure 145/78, pulse 71, temperature 98.3 F (36.8 C), temperature source Oral, resp. rate 18, height 6\' 1"  (1.854 m), weight 102.1 kg (225 lb 1.4 oz), SpO2 97.00%. No results found. No results found for this basename: WBC, HGB, HCT, PLT,  in the last 72 hours  Recent Labs  10/16/13 0542  CREATININE 1.17   CBG (last 3)   Recent Labs  10/16/13 1628 10/16/13 2116 10/17/13 0723  GLUCAP 203* 119* 203*    Wt Readings from Last 3 Encounters:  10/09/13 102.1 kg (225 lb 1.4 oz)  09/26/13 93.441 kg (206 lb)  09/26/13 93.441 kg (206 lb)    Physical Exam:  Constitutional: He is oriented to person, place, and time.  HENT:  Head: Normocephalic.  Eyes: EOM are normal.  Neck: Normal range of motion. Neck supple. No thyromegaly present.  Cardiovascular: Normal rate and regular rhythm.  Respiratory: Effort normal and breath sounds normal. No respiratory distress.  GI: Soft. Bowel sounds are normal. He exhibits no distension.  Musculoskeletal:  2+ LE edema  Neurological: He is alert and oriented to person, place, and time.  UE's 5/5. RLE 4- HF, KE, Ankle. LLE is 1+ HF and 2- distally. Difficult to test. Distal sensory loss left greater than right legs.  Skin: Skin graft site clean and dry with Xeroform/dressing in place  Left foot with bulky dressing, graft site clean with some drainage. and appropriately tender. Right foot with some ischemic changes but with palpable pedal pulses  Uro: scrotum improved Psychiatric: He has a normal mood and affect. His behavior is normal. Judgment and thought content normal    Assessment/Plan: 1. Functional deficits secondary to necrotizing fasciitis left foot/leg which require 3+ hours per day of interdisciplinary therapy in a  comprehensive inpatient rehab setting. Physiatrist is providing close team supervision and 24 hour management of active medical problems listed below. Physiatrist and rehab team continue to assess barriers to discharge/monitor patient progress toward functional and medical goals.  Dc home today  FIM: FIM - Bathing Bathing Steps Patient Completed: Chest;Right Arm;Left Arm;Abdomen;Front perineal area;Buttocks;Right upper leg;Left upper leg;Right lower leg (including foot) Bathing: 6: More than reasonable amount of time (omit LT foot due to wound/ surg site)  FIM - Upper Body Dressing/Undressing Upper body dressing/undressing steps patient completed: Thread/unthread right sleeve of pullover shirt/dresss;Thread/unthread left sleeve of pullover shirt/dress;Put head through opening of pull over shirt/dress;Pull shirt over trunk Upper body dressing/undressing: 6: More than reasonable amount of time FIM - Lower Body Dressing/Undressing Lower body dressing/undressing steps patient completed: Don/Doff right sock;Pull pants up/down;Thread/unthread left pants leg;Thread/unthread right pants leg;Pull underwear up/down;Thread/unthread left underwear leg;Thread/unthread right underwear leg Lower body dressing/undressing: 6: Assistive device (Comment) (using RW for sit<>Stand)  FIM - Toileting Toileting steps completed by patient: Adjust clothing prior to toileting;Performs perineal hygiene;Adjust clothing after toileting Toileting Assistive Devices: Grab bar or rail for support Toileting: 6: More than reasonable amount of time (3n1 over toilet)  FIM - Radio producer Devices:  (3n1 over toilet) Toilet Transfers: 6-Assistive device: No helper;6-More than reasonable amt of time  FIM - Control and instrumentation engineer Devices: Environmental consultant;Arm rests Bed/Chair Transfer: 6: Assistive device: no helper;6: More than reasonable amt of time  FIM - Locomotion:  Wheelchair Distance: 150 Locomotion:  Wheelchair: 6: Travels 150 ft or more, turns around, maneuvers to table, bed or toilet, negotiates 3% grade: maneuvers on rugs and over door sills independently FIM - Locomotion: Ambulation Locomotion: Ambulation Assistive Devices: Administrator Ambulation/Gait Assistance: 6: Modified independent (Device/Increase time) Locomotion: Ambulation: 5: Household Independent - travels 74 - 149 ft independent or modified independent  Comprehension Comprehension Mode: Auditory Comprehension: 7-Follows complex conversation/direction: With no assist  Expression Expression Mode: Verbal Expression: 6-Expresses complex ideas: With extra time/assistive device  Social Interaction Social Interaction: 7-Interacts appropriately with others - No medications needed.  Problem Solving Problem Solving: 6-Solves complex problems: With extra time  Memory Memory: 7-Complete Independence: No helper  Medical Problem List and Plan:  1. Necrotizing fasciitis left foot/leg. Status post toe amputation/irrigation and debridement and split thickness skin graft  -ortho follow up for graft site  2. DVT Prophylaxis/Anticoagulation: Subcutaneous Lovenox. Monitor platelet counts and any signs of bleeding  3. Pain Management: Oxycodone as needed as well as baclofen 10 mg 3 times daily as needed muscle spasms hiccups. Monitor with increased mobility   -scheduled oxycodone 10mg  bid with therapies   -overall pain much improved 4. Neuropsych: This patient is capable of making decisions on his own behalf.  5. ID/wound. Zosyn and vancomycin discontinued changed to oral doxycycline x2 weeks  -diflucan for scrotal fungal infection--can dc  -left ankle/foot wounds quite dry including graft. Ortho wants same dressing for now---outpt follow up 6. Diabetes mellitus with poor medical compliance. Hemoglobin A1c 13.7. NovoLog 3 units 3 times a day. Check blood sugars a.c. and at bedtime. Improved  control as a whole 7. Hypertension. Lisinopril 20 mg daily. Monitor with increased mobility  8. Acute on chronic anemia. Continue iron supplement. Followup CBC   LOS (Days) 8 A FACE TO FACE EVALUATION WAS PERFORMED  SWARTZ,ZACHARY T 10/17/2013 7:56 AM

## 2013-10-18 NOTE — Progress Notes (Signed)
Social Work  Discharge Note  The overall goal for the admission was met for:   Discharge location: Yes - home with wife to provide 24/7 assist  Length of Stay: Yes - 8 days  Discharge activity level: Yes - modified independent  Home/community participation: Yes  Services provided included: MD, RD, PT, OT, RN, TR, Pharmacy and SW  Financial Services: Other: NONE  Follow-up services arranged: Home Health: RN, PT via [place], DME: 18x18 lightweight w/c, cushion, rolling walker, 3n1 commode and tub bench via [place], Other: referred to [facility] program for first month medication assistance and Patient/Family has no preference for HH/DME agencies  Comments (or additional information):  Medicaid application able to be started just prior to pt's d/c with plan for [facility] to follow up with pt at home to begin [facility] application.  Have also referred to [facility] for ongoing medical and medication support.  Patient/Family verbalized understanding of follow-up arrangements: Yes  Individual responsible for coordination of the follow-up plan: patient  Confirmed correct DME delivered: Larna Capelle 10/18/2013    Leighanna Kirn

## 2013-10-18 NOTE — Patient Care Conference (Signed)
Inpatient RehabilitationTeam Conference and Plan of Care Update Date: 10/16/2013   Time: 3:15 PM    Patient Name: Mark [name]      Medical Record Number: [id number]  Date of Birth: 1951/01/27 Sex: Male         Room/Bed: 4M06C/4M06C-01 Payor Info: Payor: MEDICAID POTENTIAL / Plan: MEDICAID POTENTIAL / Product Type: *No Product type* /    Admitting Diagnosis: Necrotizing fascitis L leg/foot   Admit Date/Time:  10/09/2013  4:19 PM Admission Comments: No comment available   Primary Diagnosis:  <principal problem not specified> Principal Problem: <principal problem not specified>  Patient Active Problem List   Diagnosis Date Noted  . Necrotizing fasciitis 10/09/2013  . Anemia 10/01/2013  . Foot abscess, left 09/28/2013  . Hypokalemia 09/27/2013  . Cellulitis 09/26/2013  . Sepsis 09/26/2013  . Type II or unspecified type diabetes mellitus with unspecified complication, uncontrolled 09/26/2013  . Leukocytosis, unspecified 09/26/2013    Expected Discharge Date: Expected Discharge Date: 10/17/13  Team Members Present: Physician leading conference: Dr. Alger Simons Social Worker Present: Lennart Pall, LCSW Nurse Present: Other (comment) Salena Saner, RN) PT Present: Bridgett Ripa, Cottie Banda, PT OT Present: Roanna Epley, Bing Neighbors, OT;Other (comment) Jeri Modena, OT) SLP Present: Weston Anna, SLP PPS Coordinator present : Daiva Nakayama, RN, CRRN;Becky Alwyn Ren, PT     Current Status/Progress Goal Weekly Team Focus  Medical   left foot feeling better. wound stable--needs ortho follow up  stabilize medically for dc  pain control, wound care, abx    Bowel/Bladder   manage bladder with assist to empty urinal/bowel at MOD I using elevated commode  mange bladder /bladder with mod I (elevated commode and grab bar)  encourage pt to get up to toilet instead of using urinal during the day;urinal at night for now   Swallow/Nutrition/ Hydration             ADL's   MOD I for  adls   MOD I for adls  Pt pass met all goals for graduate day   Mobility   mod I  mod I, S car transfers and stair negotiation  safety, functional mobility, strengthening, balance   Communication             Safety/Cognition/ Behavioral Observations            Pain   Pain controlled with scheduled and prn medication at a level 7  pain managed with prn medication at or below level 5  monitor need for and effectiveness of medication prn pain   Skin   L foot amputation and skin graft site on L thight  no new skin breakdown  Assess skin q shift    Rehab Goals Patient on target to meet rehab goals: Yes *See Care Plan and progress notes for long and short-term goals.  Barriers to Discharge: wound, weight bearing    Possible Resolutions to Barriers:  training, pt/family education    Discharge Planning/Teaching Needs:  home with wife who can provide needed assistance  Pt will need family to learn dressing change on foot for discharge; pt unable to reach foot however can direct care of others. PT using insulin pen PTA   Team Discussion:  Ready for d/c tomorrow after education with wife on foot care  Revisions to Treatment Plan:  none   Continued Need for Acute Rehabilitation Level of Care: The patient requires daily medical management by a physician with specialized training in physical medicine and rehabilitation for the following conditions: Daily  direction of a multidisciplinary physical rehabilitation program to ensure safe treatment while eliciting the highest outcome that is of practical value to the patient.: Yes Daily medical management of patient stability for increased activity during participation in an intensive rehabilitation regime.: Yes Daily analysis of laboratory values and/or radiology reports with any subsequent need for medication adjustment of medical intervention for : Post surgical problems;Other  Charnice Zwilling, Cla[place] 10/16/2013, 3:15 PM

## 2013-10-26 ENCOUNTER — Encounter (HOSPITAL_COMMUNITY): Payer: Self-pay | Admitting: Pharmacy Technician

## 2013-10-26 ENCOUNTER — Other Ambulatory Visit (HOSPITAL_COMMUNITY): Payer: Self-pay | Admitting: Orthopaedic Surgery

## 2013-10-29 DIAGNOSIS — I10 Essential (primary) hypertension: Secondary | ICD-10-CM

## 2013-10-29 DIAGNOSIS — E1149 Type 2 diabetes mellitus with other diabetic neurological complication: Secondary | ICD-10-CM

## 2013-10-29 DIAGNOSIS — E1142 Type 2 diabetes mellitus with diabetic polyneuropathy: Secondary | ICD-10-CM

## 2013-10-29 DIAGNOSIS — Z4789 Encounter for other orthopedic aftercare: Secondary | ICD-10-CM

## 2013-10-29 DIAGNOSIS — S53449A Ulnar collateral ligament sprain of unspecified elbow, initial encounter: Secondary | ICD-10-CM

## 2013-10-30 ENCOUNTER — Other Ambulatory Visit (HOSPITAL_COMMUNITY): Payer: Self-pay

## 2013-11-01 ENCOUNTER — Encounter (HOSPITAL_COMMUNITY): Payer: Self-pay

## 2013-11-01 ENCOUNTER — Ambulatory Visit (HOSPITAL_COMMUNITY)
Admission: RE | Admit: 2013-11-01 | Discharge: 2013-11-01 | Disposition: A | Discharge status: Home / Self Care | Payer: Medicaid Other | Source: Ambulatory Visit | Attending: Orthopaedic Surgery | Admitting: Orthopaedic Surgery

## 2013-11-01 ENCOUNTER — Encounter (HOSPITAL_COMMUNITY)
Admission: RE | Admit: 2013-11-01 | Discharge: 2013-11-01 | Disposition: A | Discharge status: Home / Self Care | Payer: Medicaid Other | Source: Ambulatory Visit | Attending: Orthopaedic Surgery | Admitting: Orthopaedic Surgery

## 2013-11-01 DIAGNOSIS — Z01812 Encounter for preprocedural laboratory examination: Secondary | ICD-10-CM | POA: Insufficient documentation

## 2013-11-01 DIAGNOSIS — Z01810 Encounter for preprocedural cardiovascular examination: Secondary | ICD-10-CM | POA: Insufficient documentation

## 2013-11-01 DIAGNOSIS — Z01818 Encounter for other preprocedural examination: Secondary | ICD-10-CM | POA: Insufficient documentation

## 2013-11-01 HISTORY — DX: Asplenia (congenital): Q89.01

## 2013-11-01 HISTORY — DX: Essential (primary) hypertension: I10

## 2013-11-01 LAB — CBC
HCT: 34.1 % — ABNORMAL LOW (ref 39.0–52.0)
Hemoglobin: 11.4 g/dL — ABNORMAL LOW (ref 13.0–17.0)
MCH: 29.7 pg (ref 26.0–34.0)
MCHC: 33.4 g/dL (ref 30.0–36.0)
MCV: 88.8 fL (ref 78.0–100.0)
Platelets: 340 10*3/uL (ref 150–400)
RBC: 3.84 MIL/uL — ABNORMAL LOW (ref 4.22–5.81)
RDW: 14.2 % (ref 11.5–15.5)
WBC: 7.9 10*3/uL (ref 4.0–10.5)

## 2013-11-01 LAB — BASIC METABOLIC PANEL
BUN: 36 mg/dL — ABNORMAL HIGH (ref 6–23)
CO2: 25 mEq/L (ref 19–32)
Calcium: 10 mg/dL (ref 8.4–10.5)
Chloride: 102 mEq/L (ref 96–112)
Creatinine, Ser: 0.93 mg/dL (ref 0.50–1.35)
GFR calc Af Amer: >90 mL/min (ref >90)
GFR calc non Af Amer: 88 mL/min — ABNORMAL LOW (ref >90)
Glucose, Bld: 161 mg/dL — ABNORMAL HIGH (ref 70–99)
Potassium: 4.8 mEq/L (ref 3.7–5.3)
Sodium: 140 mEq/L (ref 137–147)

## 2013-11-01 NOTE — Pre-Procedure Instructions (Signed)
Mark [name]  11/01/2013   Your procedure is scheduled on:  11/06/13  Report to [place]  2 * 3 at 1030 AM.  Call this number if you have problems the morning of surgery: [phone]   Remember:   Do not eat food or drink liquids after midnight.   Take these medicines the morning of surgery with A SIP OF WATER: oxycodone   Do not wear jewelry, make-up or nail polish.  Do not wear lotions, powders, or perfumes. You may wear deodorant.  Do not shave 48 hours prior to surgery. Men may shave face and neck.  Do not bring valuables to the hospital.  [facility] is not responsible                  for any belongings or valuables.               Contacts, dentures or bridgework may not be worn into surgery.  Leave suitcase in the car. After surgery it may be brought to your room.  For patients admitted to the hospital, discharge time is determined by your                treatment team.               Patients discharged the day of surgery will not be allowed to drive  home.  Name and phone number of your driver: family  Special Instructions: Shower using CHG 2 nights before surgery and the night before surgery.  If you shower the day of surgery use CHG.  Use special wash - you have one bottle of CHG for all showers.  You should use approximately 1/3 of the bottle for each shower.   Please read over the following fact sheets that you were given: Pain Booklet, Coughing and Deep Breathing and Surgical Site Infection Prevention

## 2013-11-02 ENCOUNTER — Encounter: Payer: Self-pay | Admitting: Internal Medicine

## 2013-11-02 ENCOUNTER — Ambulatory Visit: Payer: Medicaid Other | Attending: Internal Medicine | Admitting: Internal Medicine

## 2013-11-02 VITALS — BP 129/81 | HR 83 | Temp 98.9°F | Resp 14

## 2013-11-02 DIAGNOSIS — S98132A Complete traumatic amputation of one left lesser toe, initial encounter: Secondary | ICD-10-CM

## 2013-11-02 DIAGNOSIS — E119 Type 2 diabetes mellitus without complications: Secondary | ICD-10-CM

## 2013-11-02 DIAGNOSIS — I10 Essential (primary) hypertension: Secondary | ICD-10-CM

## 2013-11-02 LAB — GLUCOSE, POCT (MANUAL RESULT ENTRY): POC Glucose: 211 mg/dl — AB (ref 70–99)

## 2013-11-02 MED ORDER — INSULIN GLARGINE 100 UNIT/ML SOLOSTAR PEN: 20.0000 [IU] | PEN_INJECTOR | Freq: Every day | SUBCUTANEOUS | Status: DC

## 2013-11-02 MED ORDER — INSULIN GLARGINE 100 UNIT/ML SOLOSTAR PEN: 15.0000 [IU] | PEN_INJECTOR | Freq: Every day | SUBCUTANEOUS | Status: DC

## 2013-11-02 NOTE — Progress Notes (Unsigned)
Mark [name] is diabetic. He has had his middle toe amputated
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
## 2013-11-05 MED ORDER — CEFAZOLIN SODIUM-DEXTROSE 2-3 GM-% IV SOLR
2.0000 g | INTRAVENOUS | Status: AC
  Administered 2013-11-06: 2 g via INTRAVENOUS

## 2013-11-05 NOTE — Progress Notes (Signed)
Left message with wife to arrive at 1100

## 2013-11-06 ENCOUNTER — Encounter (HOSPITAL_COMMUNITY): Payer: Self-pay | Admitting: Surgery

## 2013-11-06 ENCOUNTER — Inpatient Hospital Stay (HOSPITAL_COMMUNITY): Payer: Medicaid Other | Admitting: Anesthesiology

## 2013-11-06 ENCOUNTER — Observation Stay (HOSPITAL_COMMUNITY)
Admission: RE | Admit: 2013-11-06 | Discharge: 2013-11-08 | Disposition: A | Discharge status: Home / Self Care | Payer: Medicaid Other | Source: Ambulatory Visit | Attending: Orthopaedic Surgery | Admitting: Orthopaedic Surgery

## 2013-11-06 ENCOUNTER — Encounter (HOSPITAL_COMMUNITY)
Admission: RE | Disposition: A | Discharge status: Home / Self Care | Payer: Self-pay | Source: Ambulatory Visit | Attending: Orthopaedic Surgery

## 2013-11-06 ENCOUNTER — Encounter (HOSPITAL_COMMUNITY): Payer: Medicaid Other | Admitting: Anesthesiology

## 2013-11-06 ENCOUNTER — Other Ambulatory Visit: Payer: Self-pay | Admitting: *Deleted

## 2013-11-06 DIAGNOSIS — L97504 Non-pressure chronic ulcer of other part of unspecified foot with necrosis of bone: Secondary | ICD-10-CM

## 2013-11-06 DIAGNOSIS — Z01810 Encounter for preprocedural cardiovascular examination: Secondary | ICD-10-CM | POA: Insufficient documentation

## 2013-11-06 DIAGNOSIS — L97409 Non-pressure chronic ulcer of unspecified heel and midfoot with unspecified severity: Secondary | ICD-10-CM | POA: Insufficient documentation

## 2013-11-06 DIAGNOSIS — L97524 Non-pressure chronic ulcer of other part of left foot with necrosis of bone: Secondary | ICD-10-CM

## 2013-11-06 DIAGNOSIS — I10 Essential (primary) hypertension: Secondary | ICD-10-CM | POA: Insufficient documentation

## 2013-11-06 DIAGNOSIS — E1159 Type 2 diabetes mellitus with other circulatory complications: Secondary | ICD-10-CM | POA: Insufficient documentation

## 2013-11-06 DIAGNOSIS — Z9089 Acquired absence of other organs: Secondary | ICD-10-CM | POA: Insufficient documentation

## 2013-11-06 DIAGNOSIS — Z01818 Encounter for other preprocedural examination: Secondary | ICD-10-CM | POA: Insufficient documentation

## 2013-11-06 DIAGNOSIS — M8708 Idiopathic aseptic necrosis of bone, other site: Principal | ICD-10-CM | POA: Insufficient documentation

## 2013-11-06 DIAGNOSIS — Z87891 Personal history of nicotine dependence: Secondary | ICD-10-CM | POA: Insufficient documentation

## 2013-11-06 DIAGNOSIS — I798 Other disorders of arteries, arterioles and capillaries in diseases classified elsewhere: Secondary | ICD-10-CM | POA: Insufficient documentation

## 2013-11-06 DIAGNOSIS — Z01812 Encounter for preprocedural laboratory examination: Secondary | ICD-10-CM | POA: Insufficient documentation

## 2013-11-06 DIAGNOSIS — Z794 Long term (current) use of insulin: Secondary | ICD-10-CM | POA: Insufficient documentation

## 2013-11-06 HISTORY — PX: AMPUTATION: SHX166

## 2013-11-06 LAB — GLUCOSE, CAPILLARY
Glucose-Capillary: 183 mg/dL — ABNORMAL HIGH (ref 70–99)
Glucose-Capillary: 192 mg/dL — ABNORMAL HIGH (ref 70–99)
Glucose-Capillary: 169 mg/dL — ABNORMAL HIGH (ref 70–99)
Glucose-Capillary: 201 mg/dL — ABNORMAL HIGH (ref 70–99)

## 2013-11-06 SURGERY — AMPUTATION, FOOT, PARTIAL
Anesthesia: General | Site: Foot | Laterality: Left

## 2013-11-06 MED ORDER — INSULIN ASPART 100 UNIT/ML ~~LOC~~ SOLN
0.0000 [IU] | Freq: Three times a day (TID) | SUBCUTANEOUS | Status: DC
  Administered 2013-11-06 – 2013-11-07 (×2): 3 [IU] via SUBCUTANEOUS
  Administered 2013-11-07: 5 [IU] via SUBCUTANEOUS
  Administered 2013-11-07: 3 [IU] via SUBCUTANEOUS
  Administered 2013-11-08: 2 [IU] via SUBCUTANEOUS

## 2013-11-06 MED ORDER — HYDROCODONE-ACETAMINOPHEN 5-325 MG PO TABS
1.0000 | ORAL_TABLET | ORAL | Status: DC | PRN
  Administered 2013-11-06 – 2013-11-08 (×5): 2 via ORAL
  Filled 2013-11-06 (×5): qty 2

## 2013-11-06 MED ORDER — POLYETHYLENE GLYCOL 3350 17 G PO PACK
17.0000 g | PACK | Freq: Every day | ORAL | Status: DC | PRN
  Administered 2013-11-08: 17 g via ORAL
  Filled 2013-11-06: qty 1

## 2013-11-06 MED ORDER — PROPOFOL 10 MG/ML IV BOLUS
INTRAVENOUS | Status: DC | PRN
  Administered 2013-11-06: 140 mg via INTRAVENOUS

## 2013-11-06 MED ORDER — CEFAZOLIN SODIUM 1-5 GM-% IV SOLN
1.0000 g | Freq: Four times a day (QID) | INTRAVENOUS | Status: AC
  Administered 2013-11-06 – 2013-11-07 (×3): 1 g via INTRAVENOUS
  Filled 2013-11-06 (×3): qty 50

## 2013-11-06 MED ORDER — PHENYLEPHRINE HCL 10 MG/ML IJ SOLN
10.0000 mg | INTRAVENOUS | Status: DC | PRN
  Administered 2013-11-06: 50 ug/min via INTRAVENOUS

## 2013-11-06 MED ORDER — OXYCODONE HCL 5 MG PO TABS: 5.0000 mg | ORAL_TABLET | Freq: Once | ORAL | Status: DC | PRN

## 2013-11-06 MED ORDER — ONDANSETRON HCL 4 MG/2ML IJ SOLN
INTRAMUSCULAR | Status: AC
  Filled 2013-11-06: qty 2

## 2013-11-06 MED ORDER — LIDOCAINE HCL (CARDIAC) 10 MG/ML IV SOLN
INTRAVENOUS | Status: DC | PRN
  Administered 2013-11-06: 70 mg via INTRAVENOUS

## 2013-11-06 MED ORDER — METOCLOPRAMIDE HCL 10 MG PO TABS: 5.0000 mg | ORAL_TABLET | Freq: Three times a day (TID) | ORAL | Status: DC | PRN

## 2013-11-06 MED ORDER — CEFAZOLIN SODIUM-DEXTROSE 2-3 GM-% IV SOLR
INTRAVENOUS | Status: AC
  Administered 2013-11-06: 12:00:00
  Filled 2013-11-06: qty 50

## 2013-11-06 MED ORDER — OXYCODONE HCL 5 MG/5ML PO SOLN: 5.0000 mg | Freq: Once | ORAL | Status: DC | PRN

## 2013-11-06 MED ORDER — FERROUS GLUCONATE 324 (38 FE) MG PO TABS
324.0000 mg | ORAL_TABLET | Freq: Two times a day (BID) | ORAL | Status: DC
  Administered 2013-11-06 – 2013-11-07 (×3): 324 mg via ORAL
  Filled 2013-11-06 (×6): qty 1

## 2013-11-06 MED ORDER — LIDOCAINE HCL (CARDIAC) 20 MG/ML IV SOLN
INTRAVENOUS | Status: AC
  Filled 2013-11-06: qty 5

## 2013-11-06 MED ORDER — DIPHENHYDRAMINE HCL 12.5 MG/5ML PO ELIX: 12.5000 mg | ORAL_SOLUTION | ORAL | Status: DC | PRN

## 2013-11-06 MED ORDER — INSULIN GLARGINE 100 UNIT/ML ~~LOC~~ SOLN
15.0000 [IU] | Freq: Every day | SUBCUTANEOUS | Status: DC
  Administered 2013-11-06 – 2013-11-07 (×2): 15 [IU] via SUBCUTANEOUS
  Filled 2013-11-06 (×3): qty 0.15

## 2013-11-06 MED ORDER — DOCUSATE SODIUM 100 MG PO CAPS
100.0000 mg | ORAL_CAPSULE | Freq: Two times a day (BID) | ORAL | Status: DC
  Administered 2013-11-06 – 2013-11-08 (×3): 100 mg via ORAL
  Filled 2013-11-06 (×5): qty 1

## 2013-11-06 MED ORDER — MIDAZOLAM HCL 5 MG/5ML IJ SOLN
INTRAMUSCULAR | Status: DC | PRN
  Administered 2013-11-06: 1 mg via INTRAVENOUS

## 2013-11-06 MED ORDER — METHOCARBAMOL 500 MG PO TABS
500.0000 mg | ORAL_TABLET | Freq: Four times a day (QID) | ORAL | Status: DC | PRN
  Administered 2013-11-06 – 2013-11-07 (×3): 500 mg via ORAL
  Filled 2013-11-06 (×5): qty 1

## 2013-11-06 MED ORDER — METOCLOPRAMIDE HCL 5 MG/ML IJ SOLN
5.0000 mg | Freq: Three times a day (TID) | INTRAMUSCULAR | Status: DC | PRN
  Administered 2013-11-07: 10 mg via INTRAVENOUS
  Filled 2013-11-06: qty 2

## 2013-11-06 MED ORDER — HYDROMORPHONE HCL PF 1 MG/ML IJ SOLN: 0.2500 mg | INTRAMUSCULAR | Status: DC | PRN

## 2013-11-06 MED ORDER — FENTANYL CITRATE 0.05 MG/ML IJ SOLN
INTRAMUSCULAR | Status: AC
  Filled 2013-11-06: qty 5

## 2013-11-06 MED ORDER — FENTANYL CITRATE 0.05 MG/ML IJ SOLN
INTRAMUSCULAR | Status: DC | PRN
  Administered 2013-11-06: 100 ug via INTRAVENOUS
  Administered 2013-11-06: 50 ug via INTRAVENOUS

## 2013-11-06 MED ORDER — MORPHINE SULFATE 2 MG/ML IJ SOLN
1.0000 mg | INTRAMUSCULAR | Status: DC | PRN
  Administered 2013-11-06 – 2013-11-07 (×3): 1 mg via INTRAVENOUS
  Filled 2013-11-06 (×3): qty 1

## 2013-11-06 MED ORDER — METHOCARBAMOL 100 MG/ML IJ SOLN
500.0000 mg | Freq: Four times a day (QID) | INTRAMUSCULAR | Status: DC | PRN
  Filled 2013-11-06: qty 5

## 2013-11-06 MED ORDER — MIDAZOLAM HCL 2 MG/2ML IJ SOLN
INTRAMUSCULAR | Status: AC
  Filled 2013-11-06: qty 2

## 2013-11-06 MED ORDER — PROPOFOL 10 MG/ML IV BOLUS
INTRAVENOUS | Status: AC
  Filled 2013-11-06: qty 20

## 2013-11-06 MED ORDER — SODIUM CHLORIDE 0.9 % IV SOLN: INTRAVENOUS | Status: DC

## 2013-11-06 MED ORDER — BACLOFEN 10 MG PO TABS
10.0000 mg | ORAL_TABLET | Freq: Three times a day (TID) | ORAL | Status: DC | PRN
  Filled 2013-11-06: qty 1

## 2013-11-06 MED ORDER — BUPIVACAINE HCL (PF) 0.25 % IJ SOLN
INTRAMUSCULAR | Status: AC
  Filled 2013-11-06: qty 30

## 2013-11-06 MED ORDER — 0.9 % SODIUM CHLORIDE (POUR BTL) OPTIME
TOPICAL | Status: DC | PRN
  Administered 2013-11-06: 1000 mL

## 2013-11-06 MED ORDER — ONDANSETRON HCL 4 MG/2ML IJ SOLN
4.0000 mg | Freq: Four times a day (QID) | INTRAMUSCULAR | Status: DC | PRN
  Administered 2013-11-07: 4 mg via INTRAVENOUS
  Filled 2013-11-06: qty 2

## 2013-11-06 MED ORDER — BUPIVACAINE HCL (PF) 0.25 % IJ SOLN
INTRAMUSCULAR | Status: DC | PRN
  Administered 2013-11-06: 30 mL

## 2013-11-06 MED ORDER — LACTATED RINGERS IV SOLN
INTRAVENOUS | Status: DC
  Administered 2013-11-06 (×2): via INTRAVENOUS

## 2013-11-06 MED ORDER — INSULIN ASPART 100 UNIT/ML ~~LOC~~ SOLN
3.0000 [IU] | Freq: Three times a day (TID) | SUBCUTANEOUS | Status: DC
  Administered 2013-11-06 – 2013-11-08 (×3): 3 [IU] via SUBCUTANEOUS

## 2013-11-06 MED ORDER — OXYCODONE HCL 5 MG PO TABS
5.0000 mg | ORAL_TABLET | ORAL | Status: DC | PRN
  Administered 2013-11-06 – 2013-11-08 (×5): 10 mg via ORAL
  Filled 2013-11-06 (×5): qty 2

## 2013-11-06 MED ORDER — LISINOPRIL 20 MG PO TABS
20.0000 mg | ORAL_TABLET | Freq: Every day | ORAL | Status: DC
  Administered 2013-11-07 – 2013-11-08 (×2): 20 mg via ORAL
  Filled 2013-11-06 (×3): qty 1

## 2013-11-06 MED ORDER — ONDANSETRON HCL 4 MG PO TABS
4.0000 mg | ORAL_TABLET | Freq: Four times a day (QID) | ORAL | Status: DC | PRN
Start: 2013-11-06 — End: 2013-11-08

## 2013-11-06 SURGICAL SUPPLY — 44 items
BANDAGE ESMARK 6X9 LF (GAUZE/BANDAGES/DRESSINGS) IMPLANT
BANDAGE GAUZE ELAST BULKY 4 IN (GAUZE/BANDAGES/DRESSINGS) ×2 IMPLANT
BLADE SAW SGTL NAR THIN XSHT (BLADE) ×1 IMPLANT
BLADE SURG 10 STRL SS (BLADE) ×2 IMPLANT
BNDG CMPR 9X6 STRL LF SNTH (GAUZE/BANDAGES/DRESSINGS)
BNDG COHESIVE 4X5 TAN STRL (GAUZE/BANDAGES/DRESSINGS) ×2 IMPLANT
BNDG ESMARK 6X9 LF (GAUZE/BANDAGES/DRESSINGS)
CUFF TOURNIQUET SINGLE 34IN LL (TOURNIQUET CUFF) ×1 IMPLANT
CUFF TOURNIQUET SINGLE 44IN (TOURNIQUET CUFF) IMPLANT
DRAPE U-SHAPE 47X51 STRL (DRAPES) ×4 IMPLANT
DURAPREP 26ML APPLICATOR (WOUND CARE) ×2 IMPLANT
ELECT REM PT RETURN 9FT ADLT (ELECTROSURGICAL) ×2
ELECTRODE REM PT RTRN 9FT ADLT (ELECTROSURGICAL) ×1 IMPLANT
GAUZE XEROFORM 5X9 LF (GAUZE/BANDAGES/DRESSINGS) ×2 IMPLANT
GLOVE BIO SURGEON STRL SZ8 (GLOVE) ×2 IMPLANT
GLOVE BIOGEL PI IND STRL 6.5 (GLOVE) IMPLANT
GLOVE BIOGEL PI IND STRL 7.0 (GLOVE) IMPLANT
GLOVE BIOGEL PI IND STRL 8 (GLOVE) ×1 IMPLANT
GLOVE BIOGEL PI INDICATOR 6.5 (GLOVE) ×1
GLOVE BIOGEL PI INDICATOR 7.0 (GLOVE) ×1
GLOVE BIOGEL PI INDICATOR 8 (GLOVE) ×2
GLOVE ORTHO TXT STRL SZ7.5 (GLOVE) ×2 IMPLANT
GOWN STRL REUS W/ TWL LRG LVL3 (GOWN DISPOSABLE) ×1 IMPLANT
GOWN STRL REUS W/ TWL XL LVL3 (GOWN DISPOSABLE) ×4 IMPLANT
GOWN STRL REUS W/TWL LRG LVL3 (GOWN DISPOSABLE) ×2
GOWN STRL REUS W/TWL XL LVL3 (GOWN DISPOSABLE) ×4
KIT BASIN OR (CUSTOM PROCEDURE TRAY) ×2 IMPLANT
KIT ROOM TURNOVER OR (KITS) ×2 IMPLANT
NS IRRIG 1000ML POUR BTL (IV SOLUTION) ×2 IMPLANT
PACK ORTHO EXTREMITY (CUSTOM PROCEDURE TRAY) ×2 IMPLANT
PAD ABD 8X10 STRL (GAUZE/BANDAGES/DRESSINGS) ×1 IMPLANT
PAD ARMBOARD 7.5X6 YLW CONV (MISCELLANEOUS) ×4 IMPLANT
PAD CAST 4YDX4 CTTN HI CHSV (CAST SUPPLIES) ×1 IMPLANT
PADDING CAST COTTON 4X4 STRL (CAST SUPPLIES)
SPONGE GAUZE 4X4 12PLY (GAUZE/BANDAGES/DRESSINGS) ×2 IMPLANT
SPONGE LAP 18X18 X RAY DECT (DISPOSABLE) ×4 IMPLANT
STAPLER VISISTAT 35W (STAPLE) ×2 IMPLANT
STOCKINETTE IMPERVIOUS LG (DRAPES) IMPLANT
SUT ETHILON 2 0 PSLX (SUTURE) ×5 IMPLANT
TOWEL OR 17X24 6PK STRL BLUE (TOWEL DISPOSABLE) ×2 IMPLANT
TOWEL OR 17X26 10 PK STRL BLUE (TOWEL DISPOSABLE) ×2 IMPLANT
TUBE CONNECTING 12X1/4 (SUCTIONS) ×2 IMPLANT
WATER STERILE IRR 1000ML POUR (IV SOLUTION) ×1 IMPLANT
YANKAUER SUCT BULB TIP NO VENT (SUCTIONS) ×2 IMPLANT

## 2013-11-06 NOTE — Anesthesia Postprocedure Evaluation (Signed)
  Anesthesia Post-op Note  Patient: Mark [name]  Procedure(s) Performed: Procedure(s): LEFT FOOT TRANSMETATARSAL AMPUTATION  (Left)  Patient Location: PACU  Anesthesia Type:General  Level of Consciousness: awake and alert   Airway and Oxygen Therapy: Patient Spontanous Breathing  Post-op Pain: none  Post-op Assessment: Post-op Vital signs reviewed, Patient's Cardiovascular Status Stable and Respiratory Function Stable  Post-op Vital Signs: Reviewed  Filed Vitals:   11/06/13 1515  BP: 122/72  Pulse: 70  Temp: 36.6 C  Resp: 11    Complications: No apparent anesthesia complications

## 2013-11-06 NOTE — Transfer of Care (Signed)
Immediate Anesthesia Transfer of Care Note  Patient: Mark [name]  Procedure(s) Performed: Procedure(s): LEFT FOOT TRANSMETATARSAL AMPUTATION  (Left)  Patient Location: PACU  Anesthesia Type:General  Level of Consciousness: awake, alert , oriented and patient cooperative  Airway & Oxygen Therapy: Patient Spontanous Breathing  Post-op Assessment: Report given to PACU RN, Post -op Vital signs reviewed and stable and Patient moving all extremities  Post vital signs: Reviewed and stable  Complications: No apparent anesthesia complications

## 2013-11-06 NOTE — Anesthesia Preprocedure Evaluation (Signed)
Anesthesia Evaluation  Patient identified by MRN, date of birth, ID band Patient awake    Reviewed: Allergy & Precautions, H&P , NPO status , Patient's Chart, lab work & pertinent test results  Airway Mallampati: II TM Distance: >3 FB Neck ROM: Full    Dental no notable dental hx. (+) Partial Upper, Dental Advisory Given   Pulmonary neg pulmonary ROS, former smoker,  breath sounds clear to auscultation  Pulmonary exam normal       Cardiovascular hypertension, On Medications Rhythm:Regular Rate:Normal     Neuro/Psych negative neurological ROS  negative psych ROS   GI/Hepatic negative GI ROS, Neg liver ROS,   Endo/Other  diabetes, Type 1, Insulin Dependent  Renal/GU negative Renal ROS  negative genitourinary   Musculoskeletal   Abdominal   Peds  Hematology negative hematology ROS (+) anemia ,   Anesthesia Other Findings   Reproductive/Obstetrics negative OB ROS                           Anesthesia Physical Anesthesia Plan  ASA: III  Anesthesia Plan: General   Post-op Pain Management:    Induction: Intravenous  Airway Management Planned: LMA  Additional Equipment:   Intra-op Plan:   Post-operative Plan: Extubation in OR  Informed Consent: I have reviewed the patients History and Physical, chart, labs and discussed the procedure including the risks, benefits and alternatives for the proposed anesthesia with the patient or authorized representative who has indicated his/her understanding and acceptance.   Dental advisory given  Plan Discussed with: CRNA  Anesthesia Plan Comments:         Anesthesia Quick Evaluation

## 2013-11-06 NOTE — Brief Op Note (Signed)
11/06/2013  2:15 PM  PATIENT:  Mark [name]  63 y.o. male  PRE-OPERATIVE DIAGNOSIS:  Left foot wound, necrosis, dry gangrene  POST-OPERATIVE DIAGNOSIS:  Left foot wound, necrosis, dry gangrene  PROCEDURE:  Procedure(s): LEFT FOOT TRANSMETATARSAL AMPUTATION  (Left)  SURGEON:  Surgeon(s) and Role:    * Mcarthur Rossetti, MD - Primary  PHYSICIAN ASSISTANT: Benita Stabile, PA-C  ANESTHESIA:   local and general  EBL:    < 50 cc  BLOOD ADMINISTERED:none  DRAINS: none   LOCAL MEDICATIONS USED:  MARCAINE     SPECIMEN:  No Specimen  DISPOSITION OF SPECIMEN:  N/A  COUNTS:  YES  TOURNIQUET:   Total Tourniquet Time Documented: Thigh (Left) - 19 minutes Total: Thigh (Left) - 19 minutes   DICTATION: .Other Dictation: Dictation Number [id number]  PLAN OF CARE: Admit for overnight observation  PATIENT DISPOSITION:  PACU - hemodynamically stable.   Delay start of Pharmacological VTE agent (>24hrs) due to surgical blood loss or risk of bleeding: no
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
## 2013-11-07 LAB — GLUCOSE, CAPILLARY
Glucose-Capillary: 169 mg/dL — ABNORMAL HIGH (ref 70–99)
Glucose-Capillary: 154 mg/dL — ABNORMAL HIGH (ref 70–99)
Glucose-Capillary: 231 mg/dL — ABNORMAL HIGH (ref 70–99)
Glucose-Capillary: 165 mg/dL — ABNORMAL HIGH (ref 70–99)

## 2013-11-07 NOTE — Op Note (Signed)
NAMEDAMARKUS, [name] NO.:  [id number]  MEDICAL RECORD NO.:  [id number]  LOCATION:  5N24C                        FACILITY:  [facility]  PHYSICIAN:  Lind Guest. Ninfa Linden, M.D.DATE OF BIRTH:  September 18, 1950  DATE OF PROCEDURE:  11/06/2013 DATE OF DISCHARGE:                              OPERATIVE REPORT   PREOPERATIVE DIAGNOSIS:  Left forefoot necrosis and ischemic changes, status post multiple irrigation and debridements of an overwhelming infection involving the left foot.  POSTOPERATIVE DIAGNOSIS:  Left forefoot necrosis and ischemic changes, status post multiple irrigation and debridements of an overwhelming infection involving the left foot.  PROCEDURE:  Left transmetatarsal to midfoot amputation.  SURGEON:  Lind Guest. Ninfa Linden, M.D.  ASSISTANT:  Erskine Emery, PA-C  ANESTHESIA: 1. General. 2. Local with 0.25% plain Marcaine.  TOURNIQUET TIME:  Less than 1 hour.  BLOOD LOSS:  Less than 100 mL.  COMPLICATIONS:  None.  INDICATIONS:  Mr. Opalinski is a 63 year old gentleman well known to me. He presented over a month ago to the emergency room with necrotizing fasciitis involving his left foot.  Surprisingly, this was well contained, but remained all on the plantar surface and he had necrosis of his third toe.  He underwent a third ray resection and multiple irrigation and debridements of that left foot wound until we get the infection controlled.  He then eventually underwent split-thickness skin graft.  He is a diabetic under poor control and has peripheral vascular disease.  He has since gone on developed necrosis of his second and fourth toes as well as wound breakdown on the dorsum of his foot and some on the medial aspect.  At this point, he will likely end up needing a below-knee amputation.  He is now willing to undergo this just yet without getting a chance of the transmetatarsal amputation.  He still understands with transmetatarsal amputation  and also do have a medial foot wound that will have to treat at the [facility] or with other synthetic types of grafts to try to get this to heal and he is willing to try this before heading the below-knee amputation.  He understands this and certainly has the potential for not healing.  PROCEDURE DESCRIPTION:  After informed consent was obtained, appropriate left foot was marked.  He was brought to the operating room and placed supine on the operating table.  General anesthesia was then obtained.  A nonsterile tourniquet was placed around his upper left leg and his left foot, and ankle and leg were prepped and draped with Betadine scrub and paint.  A time-out was called to identify correct patient and correct left foot.  We then had the tourniquet elevated to 300 mm of pressure. I then made a wedge incision on the dorsum of the foot and carried this distal on the plantar aspect.  I was able to dissect down to the metatarsals of remaining and used an oscillating saw to stepwise cut the metatarsals from the first metatarsal to the fifth.  Then back to #10 blade, and was able to then remove the forefoot in its entirety.  We then irrigated the soft tissues with normal saline solution and I removed any kind of necrotic  tissue sharply with the knife or with the rongeur.  We then let the tourniquet down and we actually got some good bleeding tissue.  I then reapproximated the flap from plantar to dorsal with interrupted 2-0 nylon suture.  We were able to clean the medial foot wound as well and found no area of necrosis or malodorous area on the medial forefoot, but this will still require wound care.  We then placed a Marcaine all around the superficial and deep peroneal nerve, areas of the ankle with ankle joint itself and the forefoot.  Xeroform and well-padded sterile dressing were applied.  He was taken to the recovery room in stable condition.  All final counts were correct. There were  no complications noted.  Of note, Erskine Emery, PA-C assisted during the entire case and his assistance was integral in getting the case facilitated and completed.     Lind Guest. Ninfa Linden, M.D.     CYB/MEDQ  D:  11/06/2013  T:  11/07/2013  Job:  [id number]

## 2013-11-07 NOTE — Evaluation (Signed)
Physical Therapy Evaluation Patient Details Name: Mark [name] MRN: [id number] DOB: [birth year] Today's Date: 11/07/2013 Time: 1030-1100 PT Time Calculation (min): 30 min  PT Assessment / Plan / Recommendation History of Present Illness  Pt previously admitted 09/26/13 with infection of L foot. Pt has H/O DM. Pt has had several  surgical I&D's, most recent  for L 3rd toe amputaion, wound closures, skin graft and wound VAC. for wound closure. Pt is s/p Left Transmetatarsal amputation.  Clinical Impression  Pt presents with dependencies in mobility due to Left transmetatarsal amputation and resulting L NWB. Pt's main complaint is dizziness with gait limiting his mobility. Pt did excellent with maintaining NWB and was able to take some steps and transfer to a chair. Recommend d/c home when stable with HHPT and continued inpatient PT in the am for step training and gait prior to possible d/c home.    PT Assessment  Patient needs continued PT services    Follow Up Recommendations  Home health PT    Does the patient have the potential to tolerate intense rehabilitation      Barriers to Discharge        Equipment Recommendations  None recommended by PT    Recommendations for Other Services     Frequency Min 5X/week    Precautions / Restrictions Precautions Precautions: Fall Restrictions Weight Bearing Restrictions: Yes LLE Weight Bearing: Non weight bearing   Pertinent Vitals/Pain       Mobility  Bed Mobility Overal bed mobility: Modified Independent Transfers Overall transfer level: Needs assistance Equipment used: Rolling walker (2 wheeled) Transfers: Sit to/from [facility] Sit to Stand: Min guard Stand pivot transfers: Min guard General transfer comment: Pt did an excellent job with NWB. Pt required min guard assist due to c/o dizziness. VC needed for hand placement for increased safety. Ambulation/Gait Ambulation/Gait assistance: Min guard Ambulation  Distance (Feet): 8 Feet Assistive device: Rolling walker (2 wheeled) Gait velocity interpretation: Below normal speed for age/gender    Exercises     PT Diagnosis: Difficulty walking  PT Problem List: Decreased activity tolerance;Decreased balance;Decreased knowledge of use of DME;Decreased mobility PT Treatment Interventions: DME instruction;Gait training;Stair training;Therapeutic activities;Balance training;Patient/family education     PT Goals(Current goals can be found in the care plan section) Acute Rehab PT Goals Patient Stated Goal: To return home PT Goal Formulation: With patient Time For Goal Achievement: 11/14/13 Potential to Achieve Goals: Good  Visit Information  Last PT Received On: 11/07/13 Assistance Needed: +1 History of Present Illness: Pt previously admitted 09/26/13 with infection of L foot. Pt has H/O DM. Pt has had several  surgical I&D's, most recent  for L 3rd toe amputaion, wound closures, skin graft and wound VAC. for wound closure. Pt is s/p Left Transmetatarsal amputation.       Prior [place] expects to be discharged to:: Private residence Living Arrangements: Spouse/significant other Available Help at Discharge: Family;Available 24 hours/day Type of Home: House Home Access: Stairs to enter [employer] of Steps: 2 Entrance Stairs-Rails: None Home Layout: One level Home Equipment: Wheelchair - manual Prior Function Level of Independence: Independent Communication Communication: No difficulties    Cognition  Cognition Arousal/Alertness: Awake/alert Behavior During Therapy: WFL for tasks assessed/performed Overall Cognitive Status: Within Functional Limits for tasks assessed    Extremity/Trunk Assessment Upper Extremity Assessment Upper Extremity Assessment: Defer to OT evaluation Lower Extremity Assessment Lower Extremity Assessment: Overall WFL for tasks assessed   Balance Balance Overall balance  assessment:  Needs assistance Sitting balance-Leahy Scale: Normal Standing balance support: Bilateral upper extremity supported Standing balance-Leahy Scale: Fair  End of Session PT - End of Session Equipment Utilized During Treatment: Gait belt Activity Tolerance: Treatment limited secondary to medical complications (Comment) (dizziness) Patient left: in chair;with call bell/phone within reach Nurse Communication: Mobility status  GP Functional Assessment Tool Used: clinical judgement Functional Limitation: Mobility: Walking and moving around Mobility: Walking and Moving Around Current Status [id number]): At least 1 percent but less than 20 percent impaired, limited or restricted Mobility: Walking and Moving Around Goal Status [phone]): 0 percent impaired, limited or restricted   Lelon Mast 11/07/2013, 11:08 AM

## 2013-11-07 NOTE — Progress Notes (Signed)
UR completed 

## 2013-11-07 NOTE — Progress Notes (Signed)
Subjective: 1 Day Post-Op Procedure(s) (LRB): LEFT FOOT TRANSMETATARSAL AMPUTATION  (Left) Patient reports pain as moderate.    Objective: Vital signs in last 24 hours: Temp:  [97.4 F (36.3 C)-99.4 F (37.4 C)] 99.4 F (37.4 C) (03/10 2258) Pulse Rate:  [66-79] 79 (03/10 2258) Resp:  [10-18] 18 (03/10 2258) BP: (122-147)/(64-86) 123/64 mmHg (03/10 2258) SpO2:  [98 %-100 %] 100 % (03/10 2258)  Intake/Output from previous day: 03/10 0701 - 03/11 0700 In: 2080 [P.O.:480; I.V.:1600] Out: 1150 [Urine:1150] Intake/Output this shift:    No results found for this basename: HGB,  in the last 72 hours No results found for this basename: WBC, RBC, HCT, PLT,  in the last 72 hours No results found for this basename: NA, K, CL, CO2, BUN, CREATININE, GLUCOSE, CALCIUM,  in the last 72 hours No results found for this basename: LABPT, INR,  in the last 72 hours  Incision: dressing C/D/I  Assessment/Plan: 1 Day Post-Op Procedure(s) (LRB): LEFT FOOT TRANSMETATARSAL AMPUTATION  (Left) Up with therapy Plan for discharge tomorrow  Mcarthur Rossetti 11/07/2013, 7:18 AM

## 2013-11-08 ENCOUNTER — Encounter (HOSPITAL_COMMUNITY): Payer: Self-pay | Admitting: Orthopaedic Surgery

## 2013-11-08 LAB — GLUCOSE, CAPILLARY
Glucose-Capillary: 148 mg/dL — ABNORMAL HIGH (ref 70–99)
Glucose-Capillary: 141 mg/dL — ABNORMAL HIGH (ref 70–99)

## 2013-11-08 MED ORDER — OXYCODONE HCL 5 MG PO TABS: 5.0000 mg | ORAL_TABLET | ORAL | Status: DC | PRN

## 2013-11-08 NOTE — Discharge Summary (Signed)
Patient ID: Mark [name] MRN: [name] DOB/AGE: [birth year] 63 y.o.  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 Diabetes mellitus without complication   . Diverticulitis   . Spleen absent   . Hypertension     no pcp    Surgeries: Procedure(s): LEFT FOOT TRANSMETATARSAL AMPUTATION  on 11/06/2013   Consultants:    Discharged Condition: Improved  Hospital Course: Mark [name] is an 63 y.o. male who was admitted 11/06/2013 for operative treatment ofNon-healing ulcer of foot with necrosis of bone. Patient has severe unremitting pain that affects sleep, daily activities, and work/hobbies. After pre-op clearance the patient was taken to the operating room on 11/06/2013 and underwent  Procedure(s): LEFT FOOT TRANSMETATARSAL AMPUTATION .    Patient was given perioperative antibiotics: Anti-infectives   Start     Dose/Rate Route Frequency Ordered Stop   11/06/13 1800  ceFAZolin (ANCEF) IVPB 1 g/50 mL premix     1 g 100 mL/hr over 30 Minutes Intravenous Every 6 hours 11/06/13 1602 11/07/13 0711   11/06/13 1116  ceFAZolin (ANCEF) 2-3 GM-% IVPB SOLR    Comments:  Ara Kussmaul   : cabinet override      11/06/13 1116 11/06/13 1130   11/06/13 0600  ceFAZolin (ANCEF) IVPB 2 g/50 mL premix     2 g 100 mL/hr over 30 Minutes Intravenous On call to O.R. 11/05/13 1452 11/06/13 1320       Patient was given sequential compression devices, early ambulation, and chemoprophylaxis to prevent DVT.  Patient benefited maximally from hospital stay and there were no complications.    Recent vital signs: Patient Vitals for the past 24 hrs:  BP Temp Temp src Pulse Resp SpO2  11/08/13 0548 114/65 mmHg 98.8 F (37.1 C) Oral 80 18 100 %  11/08/13  0300 - 99 F (37.2 C) Oral - - -  11/08/13 0114 - 100.5 F (38.1 C) Oral - - -  11/07/13 2001 114/61 mmHg 100.2 F (37.9 C) Oral 96 18 99 %  11/07/13 1247 108/60 mmHg 99.1 F (37.3 C) - 85 18 99 %  11/07/13 0743 136/73 mmHg 100 F (37.8 C) Oral 86 18 99 %     Recent laboratory studies: No results found for this basename: WBC, HGB, HCT, PLT, NA, K, CL, CO2, BUN, CREATININE, GLUCOSE, PT, INR, CALCIUM, 2,  in the last 72 hours   Discharge Medications:     Medication List         acetaminophen 500 MG tablet  Commonly known as:  TYLENOL  Take 500 mg by mouth every 6 (six) hours as needed for mild pain.     baclofen 10 MG tablet  Commonly known as:  LIORESAL  Take 1 tablet (10 mg total) by mouth 3 (three) times daily as needed for muscle spasms (hiccups).     ferrous gluconate 324 MG tablet  Commonly known as:  FERGON  Take 1 tablet (324 mg total) by mouth 2 (two) times daily with a meal.     insulin aspart 100 UNIT/ML injection  Commonly known as:  novoLOG  Inject 3 Units into the skin 3 (three) times daily with meals.     Insulin Glargine 100 UNIT/ML Solostar Pen  Commonly known as:  LANTUS SOLOSTAR  Inject 15 Units into the skin daily at 10 pm.     lisinopril 20 MG tablet  Commonly known as:  PRINIVIL,ZESTRIL  Take 1 tablet (20 mg total) by mouth daily.     oxyCODONE 5 MG immediate release tablet  Commonly known as:  Oxy IR/ROXICODONE  Take 1 tablet (5 mg total) by mouth every 4 (four) hours as needed for moderate pain.        Diagnostic Studies: Dg Chest 2 View  11/01/2013   CLINICAL DATA:  htn, preop evaluation  EXAM: CHEST  2 VIEW  COMPARISON:  None.  FINDINGS: The heart size and mediastinal contours are within normal limits. Both lungs are clear. The visualized skeletal structures are unremarkable.  IMPRESSION: No active cardiopulmonary disease.   Electronically Signed   By: Margaree Mackintosh M.D.   On: 11/01/2013 15:26    Disposition: 06-Home-Health Care Svc       Discharge Orders   Future Appointments Provider Department Dept Phone   11/15/2013 2:00 PM [facility] [phone]   12/12/2013 10:20 AM Meredith Staggers, MD [facility] [phone]   Future Orders Complete By Expires   Call MD / Call 911  As directed    Comments:     If you experience chest pain or shortness of breath, CALL 911 and be transported to the hospital emergency room.  If you develope a fever above 101 F, pus (white drainage) or increased drainage or redness at the wound, or calf pain, call your surgeon's office.   Constipation Prevention  As directed    Comments:     Drink plenty of fluids.  Prune juice may be helpful.  You may use a stool softener, such as Colace (over the counter) 100 mg twice a day.  Use MiraLax (over the counter) for constipation as needed.   Diet - low sodium heart healthy  As directed    Discharge instructions  As directed    Comments:     Keep your left foot dressing clean and dry   Discharge patient  As directed    Increase activity slowly as tolerated  As directed       Follow-up Information   Follow up with Mcarthur Rossetti, MD. Schedule an appointment as soon as possible for a visit in 1 week.   Specialty:  Orthopedic Surgery   Contact information:   [place] [address] [phone]        Signed: Mcarthur Rossetti 11/08/2013, 6:26 AM

## 2013-11-08 NOTE — Care Management Note (Signed)
CARE MANAGEMENT NOTE 11/08/2013  Patient:  Mark [name], Mark [name]   Account Number:  [id number]  Date Initiated:  11/08/2013  Documentation initiated by:  Ricki Miller  Subjective/Objective Assessment:   63 yr old male s/p left midfoot amputation.     Action/Plan:   patient has all necessary DME. Active with [facility]. notified [facility], [place].   Anticipated DC Date:  11/08/2013   Anticipated DC Plan:  [place]  CM consult      [facility] Choice  NA  Resumption Of Svcs/PTA Provider   Choice offered to / List presented to:          [facility] arranged  HH-2 PT      [place].   Status of service:  Completed, signed off Medicare Important Message given?   (If response is "NO", the following Medicare IM given date fields will be blank) Date Medicare IM given:   Date Additional Medicare IM given:    Discharge Disposition:  [place]  Per UR Regulation:    If discussed at Long Length of Stay Meetings, dates discussed:    Comments:

## 2013-11-08 NOTE — Progress Notes (Signed)
Physical Therapy Treatment Patient Details Name: Mark [name] MRN: [id number] DOB: 12/31/1950 Today's Date: 11/08/2013 Time: [id number] PT Time Calculation (min): 27 min  PT Assessment / Plan / Recommendation  History of Present Illness Pt previously admitted 09/26/13 with infection of L foot. Pt has H/O DM. Pt has had several  surgical I&D's, most recent  for L 3rd toe amputaion, wound closures, skin graft and wound VAC. for wound closure. Pt is s/p Left Transmetatarsal amputation.   PT Comments   Pt. With good compliance for NWB status and is pretty well balanced on level surfaces.  Suggest wife supports him at min guard level on level surfaces and that his wife and daughter assist him in negotiating 2 steps.  He has been taught the correct technique to negotiate 2 steps with landing in between and how he should instruct his wife and daughter to assist.  He demnostrates good knowledge of technique.  He anticipates DC later today as he reports his wife doesn't get up until noon.    Follow Up Recommendations  Home health PT     Does the patient have the potential to tolerate intense rehabilitation     Barriers to Discharge        Equipment Recommendations  None recommended by PT    Recommendations for Other Services    Frequency Min 5X/week   Progress towards PT Goals Progress towards PT goals: Progressing toward goals  Plan Current plan remains appropriate    Precautions / Restrictions Restrictions Weight Bearing Restrictions: Yes LLE Weight Bearing: Non weight bearing Other Position/Activity Restrictions: Pt. very aware of NWB and maintaining well, thought he did feel as though he put the foot down once, not observed by therapist.     Pertinent Vitals/Pain See vitals tab Painful when L LE in dependent position but able to tolerate mobility in spite of pain    Mobility  Bed Mobility Overal bed mobility: Modified Independent Transfers Overall transfer level: Needs  assistance Equipment used: Rolling walker (2 wheeled) Transfers: Sit to/from Stand Sit to Stand: Min guard General transfer comment: Pt. appeared stable with sit <>stand and min guard assist for safety.  Educated pt. that until he is cleared by HHPT, suggest his wife is at his side for transitions to assure stability Ambulation/Gait Ambulation/Gait assistance: Min guard Ambulation Distance (Feet): 40 Feet Assistive device: Rolling walker (2 wheeled) Gait Pattern/deviations: Step-to pattern (single leg hop) Gait velocity: decreased Gait velocity interpretation: Below normal speed for age/gender General Gait Details: good technique and placement of RW Stairs: Yes Stairs assistance: Min assist Stair Management: No rails;Backwards;With walker Number of Stairs: 1 (pt. has 2 steps with landing in between) General stair comments: pt. with good technique and knowledge of pattern.  Needs balance assist to move RW up to  and down from step    Exercises     PT Diagnosis:    PT Problem List:   PT Treatment Interventions:     PT Goals (current goals can now be found in the care plan section)    Visit Information  Last PT Received On: 11/08/13 Assistance Needed: +1 History of Present Illness: Pt previously admitted 09/26/13 with infection of L foot. Pt has H/O DM. Pt has had several  surgical I&D's, most recent  for L 3rd toe amputaion, wound closures, skin graft and wound VAC. for wound closure. Pt is s/p Left Transmetatarsal amputation.    Subjective Data  Subjective: Pt. reports sharp and phantom pain when up with leg  in dependent position   Cognition  Cognition Arousal/Alertness: Awake/alert Behavior During Therapy: WFL for tasks assessed/performed Overall Cognitive Status: Within Functional Limits for tasks assessed    Balance  Balance Sitting balance-Leahy Scale: Normal  End of Session PT - End of Session Equipment Utilized During Treatment: Gait belt Activity Tolerance: Patient  tolerated treatment well (no dizziness today during session) Patient left: in chair;with call bell/phone within reach Nurse Communication: Mobility status   GP Functional Assessment Tool Used: clinical judgement Functional Limitation: Mobility: Walking and moving around Mobility: Walking and Moving Around Current Status [id number]): At least 1 percent but less than 20 percent impaired, limited or restricted Mobility: Walking and Moving Around Discharge Status [phone]): At least 1 percent but less than 20 percent impaired, limited or restricted   Ladona Ridgel 11/08/2013, 9:29 AM Gerlean Ren PT Acute Rehab Services [phone] [place] [phone]

## 2013-11-08 NOTE — Progress Notes (Signed)
Subjective: 2 Days Post-Op Procedure(s) (LRB): LEFT FOOT TRANSMETATARSAL AMPUTATION  (Left) Patient reports pain as moderate.    Objective: Vital signs in last 24 hours: Temp:  [98.8 F (37.1 C)-100.5 F (38.1 C)] 98.8 F (37.1 C) (03/12 0548) Pulse Rate:  [80-96] 80 (03/12 0548) Resp:  [18] 18 (03/12 0548) BP: (108-136)/(60-73) 114/65 mmHg (03/12 0548) SpO2:  [99 %-100 %] 100 % (03/12 0548)  Intake/Output from previous day: 03/11 0701 - 03/12 0700 In: 720 [P.O.:720] Out: 900 [Urine:900] Intake/Output this shift: Total I/O In: 240 [P.O.:240] Out: 400 [Urine:400]  No results found for this basename: HGB,  in the last 72 hours No results found for this basename: WBC, RBC, HCT, PLT,  in the last 72 hours No results found for this basename: NA, K, CL, CO2, BUN, CREATININE, GLUCOSE, CALCIUM,  in the last 72 hours No results found for this basename: LABPT, INR,  in the last 72 hours  Incision: dressing C/D/I  Assessment/Plan: 2 Days Post-Op Procedure(s) (LRB): LEFT FOOT TRANSMETATARSAL AMPUTATION  (Left) Discharge home with home health  Mark [name] 11/08/2013, 6:24 AM

## 2013-11-15 ENCOUNTER — Ambulatory Visit: Payer: Medicaid Other | Attending: Internal Medicine

## 2013-11-16 ENCOUNTER — Other Ambulatory Visit (HOSPITAL_COMMUNITY): Payer: Self-pay | Admitting: Orthopedic Surgery

## 2013-11-16 ENCOUNTER — Encounter (HOSPITAL_COMMUNITY): Payer: Self-pay

## 2013-11-20 ENCOUNTER — Encounter (HOSPITAL_COMMUNITY): Payer: Self-pay | Admitting: *Deleted

## 2013-11-20 MED ORDER — CEFAZOLIN SODIUM-DEXTROSE 2-3 GM-% IV SOLR: 2.0000 g | INTRAVENOUS | Status: DC

## 2013-11-21 ENCOUNTER — Inpatient Hospital Stay (HOSPITAL_COMMUNITY)
Admission: RE | Admit: 2013-11-21 | Discharge: 2013-11-23 | DRG: 617 | Disposition: A | Discharge status: Home Health | Payer: Medicaid Other | Source: Ambulatory Visit | Attending: Orthopedic Surgery | Admitting: Orthopedic Surgery

## 2013-11-21 ENCOUNTER — Encounter (HOSPITAL_COMMUNITY): Payer: Self-pay | Admitting: *Deleted

## 2013-11-21 ENCOUNTER — Inpatient Hospital Stay (HOSPITAL_COMMUNITY): Payer: Medicaid Other | Admitting: Certified Registered"

## 2013-11-21 ENCOUNTER — Encounter (HOSPITAL_COMMUNITY)
Admission: RE | Disposition: A | Discharge status: Home Health | Payer: Self-pay | Source: Ambulatory Visit | Attending: Orthopedic Surgery

## 2013-11-21 ENCOUNTER — Encounter (HOSPITAL_COMMUNITY): Payer: Medicaid Other | Admitting: Certified Registered"

## 2013-11-21 DIAGNOSIS — I70269 Atherosclerosis of native arteries of extremities with gangrene, unspecified extremity: Secondary | ICD-10-CM | POA: Diagnosis present

## 2013-11-21 DIAGNOSIS — E1169 Type 2 diabetes mellitus with other specified complication: Principal | ICD-10-CM | POA: Diagnosis present

## 2013-11-21 DIAGNOSIS — Z89519 Acquired absence of unspecified leg below knee: Secondary | ICD-10-CM

## 2013-11-21 DIAGNOSIS — Y92009 Unspecified place in unspecified non-institutional (private) residence as the place of occurrence of the external cause: Secondary | ICD-10-CM

## 2013-11-21 DIAGNOSIS — Z794 Long term (current) use of insulin: Secondary | ICD-10-CM

## 2013-11-21 DIAGNOSIS — T8789 Other complications of amputation stump: Secondary | ICD-10-CM | POA: Diagnosis present

## 2013-11-21 DIAGNOSIS — Z87891 Personal history of nicotine dependence: Secondary | ICD-10-CM

## 2013-11-21 DIAGNOSIS — D62 Acute posthemorrhagic anemia: Secondary | ICD-10-CM | POA: Diagnosis not present

## 2013-11-21 DIAGNOSIS — G547 Phantom limb syndrome without pain: Secondary | ICD-10-CM | POA: Diagnosis not present

## 2013-11-21 DIAGNOSIS — I10 Essential (primary) hypertension: Secondary | ICD-10-CM | POA: Diagnosis present

## 2013-11-21 DIAGNOSIS — Z79899 Other long term (current) drug therapy: Secondary | ICD-10-CM

## 2013-11-21 DIAGNOSIS — E1149 Type 2 diabetes mellitus with other diabetic neurological complication: Secondary | ICD-10-CM | POA: Diagnosis present

## 2013-11-21 DIAGNOSIS — M869 Osteomyelitis, unspecified: Secondary | ICD-10-CM | POA: Diagnosis present

## 2013-11-21 DIAGNOSIS — S98139A Complete traumatic amputation of one unspecified lesser toe, initial encounter: Secondary | ICD-10-CM

## 2013-11-21 DIAGNOSIS — L97509 Non-pressure chronic ulcer of other part of unspecified foot with unspecified severity: Secondary | ICD-10-CM | POA: Diagnosis present

## 2013-11-21 DIAGNOSIS — E1142 Type 2 diabetes mellitus with diabetic polyneuropathy: Secondary | ICD-10-CM | POA: Diagnosis present

## 2013-11-21 DIAGNOSIS — G8918 Other acute postprocedural pain: Secondary | ICD-10-CM | POA: Diagnosis not present

## 2013-11-21 DIAGNOSIS — Z833 Family history of diabetes mellitus: Secondary | ICD-10-CM

## 2013-11-21 DIAGNOSIS — E1159 Type 2 diabetes mellitus with other circulatory complications: Secondary | ICD-10-CM | POA: Diagnosis present

## 2013-11-21 DIAGNOSIS — M9080 Osteopathy in diseases classified elsewhere, unspecified site: Secondary | ICD-10-CM | POA: Diagnosis present

## 2013-11-21 DIAGNOSIS — Y835 Amputation of limb(s) as the cause of abnormal reaction of the patient, or of later complication, without mention of misadventure at the time of the procedure: Secondary | ICD-10-CM | POA: Diagnosis present

## 2013-11-21 HISTORY — DX: Acquired absence of unspecified leg below knee: Z89.519

## 2013-11-21 HISTORY — PX: AMPUTATION: SHX166

## 2013-11-21 LAB — COMPREHENSIVE METABOLIC PANEL
ALT: 14 U/L (ref 0–53)
AST: 14 U/L (ref 0–37)
Albumin: 2.4 g/dL — ABNORMAL LOW (ref 3.5–5.2)
Alkaline Phosphatase: 98 U/L (ref 39–117)
BUN: 34 mg/dL — ABNORMAL HIGH (ref 6–23)
CO2: 23 mEq/L (ref 19–32)
Calcium: 9.4 mg/dL (ref 8.4–10.5)
Chloride: 102 mEq/L (ref 96–112)
Creatinine, Ser: 1.04 mg/dL (ref 0.50–1.35)
GFR calc Af Amer: 87 mL/min — ABNORMAL LOW (ref >90)
GFR calc non Af Amer: 75 mL/min — ABNORMAL LOW (ref >90)
Glucose, Bld: 130 mg/dL — ABNORMAL HIGH (ref 70–99)
Potassium: 4.1 mEq/L (ref 3.7–5.3)
Sodium: 140 mEq/L (ref 137–147)
Total Bilirubin: <0.2 mg/dL — ABNORMAL LOW (ref 0.3–1.2)
Total Protein: 8.2 g/dL (ref 6.0–8.3)

## 2013-11-21 LAB — CBC
HCT: 26.1 % — ABNORMAL LOW (ref 39.0–52.0)
Hemoglobin: 8.5 g/dL — ABNORMAL LOW (ref 13.0–17.0)
MCH: 28.8 pg (ref 26.0–34.0)
MCHC: 32.6 g/dL (ref 30.0–36.0)
MCV: 88.5 fL (ref 78.0–100.0)
Platelets: 848 10*3/uL — ABNORMAL HIGH (ref 150–400)
RBC: 2.95 MIL/uL — ABNORMAL LOW (ref 4.22–5.81)
RDW: 15 % (ref 11.5–15.5)
WBC: 11.4 10*3/uL — ABNORMAL HIGH (ref 4.0–10.5)

## 2013-11-21 LAB — GLUCOSE, CAPILLARY
Glucose-Capillary: 133 mg/dL — ABNORMAL HIGH (ref 70–99)
Glucose-Capillary: 110 mg/dL — ABNORMAL HIGH (ref 70–99)
Glucose-Capillary: 133 mg/dL — ABNORMAL HIGH (ref 70–99)
Glucose-Capillary: 93 mg/dL (ref 70–99)

## 2013-11-21 LAB — PROTIME-INR
INR: 1.06 (ref 0.00–1.49)
Prothrombin Time: 13.6 seconds (ref 11.6–15.2)

## 2013-11-21 LAB — APTT: aPTT: 35 seconds (ref 24–37)

## 2013-11-21 LAB — ABO/RH: ABO/RH(D): O POS

## 2013-11-21 LAB — PREPARE RBC (CROSSMATCH)

## 2013-11-21 SURGERY — AMPUTATION BELOW KNEE
Anesthesia: General | Site: Leg Lower | Laterality: Left

## 2013-11-21 MED ORDER — OXYCODONE HCL 5 MG PO TABS
ORAL_TABLET | ORAL | Status: AC
  Administered 2013-11-21: 10 mg via ORAL
  Filled 2013-11-21: qty 2

## 2013-11-21 MED ORDER — FENTANYL CITRATE 0.05 MG/ML IJ SOLN
INTRAMUSCULAR | Status: DC | PRN
  Administered 2013-11-21 (×2): 50 ug via INTRAVENOUS
  Administered 2013-11-21: 100 ug via INTRAVENOUS

## 2013-11-21 MED ORDER — DEXTROSE 5 % IV SOLN
500.0000 mg | Freq: Four times a day (QID) | INTRAVENOUS | Status: DC | PRN
  Filled 2013-11-21: qty 5

## 2013-11-21 MED ORDER — ONDANSETRON HCL 4 MG/2ML IJ SOLN
4.0000 mg | Freq: Four times a day (QID) | INTRAMUSCULAR | Status: DC | PRN
  Administered 2013-11-22: 4 mg via INTRAVENOUS
  Filled 2013-11-21: qty 2

## 2013-11-21 MED ORDER — INSULIN ASPART 100 UNIT/ML ~~LOC~~ SOLN
4.0000 [IU] | Freq: Three times a day (TID) | SUBCUTANEOUS | Status: DC
  Administered 2013-11-21 – 2013-11-23 (×6): 4 [IU] via SUBCUTANEOUS

## 2013-11-21 MED ORDER — ONDANSETRON HCL 4 MG/2ML IJ SOLN
INTRAMUSCULAR | Status: AC
  Filled 2013-11-21: qty 2

## 2013-11-21 MED ORDER — HYDROMORPHONE HCL PF 1 MG/ML IJ SOLN
0.5000 mg | INTRAMUSCULAR | Status: AC | PRN
  Administered 2013-11-21 (×4): 0.5 mg via INTRAVENOUS

## 2013-11-21 MED ORDER — OXYCODONE-ACETAMINOPHEN 5-325 MG PO TABS
1.0000 | ORAL_TABLET | ORAL | Status: DC | PRN
  Administered 2013-11-21 – 2013-11-23 (×10): 2 via ORAL
  Filled 2013-11-21 (×10): qty 2

## 2013-11-21 MED ORDER — CEFAZOLIN SODIUM-DEXTROSE 2-3 GM-% IV SOLR
INTRAVENOUS | Status: AC
  Administered 2013-11-21: 2 g via INTRAVENOUS
  Filled 2013-11-21: qty 50

## 2013-11-21 MED ORDER — PROPOFOL 10 MG/ML IV BOLUS
INTRAVENOUS | Status: DC | PRN
  Administered 2013-11-21 (×2): 100 mg via INTRAVENOUS

## 2013-11-21 MED ORDER — HYDROMORPHONE HCL PF 1 MG/ML IJ SOLN
INTRAMUSCULAR | Status: AC
  Administered 2013-11-21: 0.5 mg via INTRAVENOUS
  Filled 2013-11-21: qty 1

## 2013-11-21 MED ORDER — FENTANYL CITRATE 0.05 MG/ML IJ SOLN
INTRAMUSCULAR | Status: AC
  Filled 2013-11-21: qty 5

## 2013-11-21 MED ORDER — HYDROMORPHONE HCL PF 1 MG/ML IJ SOLN: 0.5000 mg | INTRAMUSCULAR | Status: DC | PRN

## 2013-11-21 MED ORDER — MIDAZOLAM HCL 2 MG/2ML IJ SOLN
INTRAMUSCULAR | Status: AC
  Filled 2013-11-21: qty 2

## 2013-11-21 MED ORDER — INSULIN GLARGINE 100 UNIT/ML ~~LOC~~ SOLN
15.0000 [IU] | Freq: Every day | SUBCUTANEOUS | Status: DC
  Administered 2013-11-21 – 2013-11-22 (×2): 15 [IU] via SUBCUTANEOUS
  Filled 2013-11-21 (×3): qty 0.15

## 2013-11-21 MED ORDER — ONDANSETRON HCL 4 MG/2ML IJ SOLN
INTRAMUSCULAR | Status: DC | PRN
  Administered 2013-11-21: 4 mg via INTRAVENOUS

## 2013-11-21 MED ORDER — SODIUM CHLORIDE 0.9 % IV SOLN
INTRAVENOUS | Status: DC
  Administered 2013-11-21: 20 mL/h via INTRAVENOUS

## 2013-11-21 MED ORDER — HYDROMORPHONE HCL PF 1 MG/ML IJ SOLN
0.5000 mg | INTRAMUSCULAR | Status: DC | PRN
  Administered 2013-11-21 – 2013-11-22 (×6): 1 mg via INTRAVENOUS
  Filled 2013-11-21 (×6): qty 1

## 2013-11-21 MED ORDER — HYDROMORPHONE HCL PF 1 MG/ML IJ SOLN
0.2500 mg | INTRAMUSCULAR | Status: DC | PRN
  Administered 2013-11-21 (×4): 0.5 mg via INTRAVENOUS

## 2013-11-21 MED ORDER — LIDOCAINE HCL (CARDIAC) 20 MG/ML IV SOLN
INTRAVENOUS | Status: DC | PRN
  Administered 2013-11-21: 90 mg via INTRAVENOUS

## 2013-11-21 MED ORDER — ONDANSETRON HCL 4 MG/2ML IJ SOLN: 4.0000 mg | Freq: Once | INTRAMUSCULAR | Status: DC | PRN

## 2013-11-21 MED ORDER — HYDROMORPHONE HCL PF 1 MG/ML IJ SOLN
INTRAMUSCULAR | Status: AC
  Filled 2013-11-21: qty 1

## 2013-11-21 MED ORDER — CEFAZOLIN SODIUM 1-5 GM-% IV SOLN
1.0000 g | Freq: Four times a day (QID) | INTRAVENOUS | Status: AC
  Administered 2013-11-21 – 2013-11-22 (×3): 1 g via INTRAVENOUS
  Filled 2013-11-21 (×3): qty 50

## 2013-11-21 MED ORDER — FERROUS GLUCONATE 324 (38 FE) MG PO TABS
324.0000 mg | ORAL_TABLET | Freq: Two times a day (BID) | ORAL | Status: DC
  Administered 2013-11-21 – 2013-11-23 (×4): 324 mg via ORAL
  Filled 2013-11-21 (×6): qty 1

## 2013-11-21 MED ORDER — LACTATED RINGERS IV SOLN
INTRAVENOUS | Status: DC
Start: 2013-11-21 — End: 2013-11-21
  Administered 2013-11-21: 08:00:00 via INTRAVENOUS

## 2013-11-21 MED ORDER — ASPIRIN EC 325 MG PO TBEC
325.0000 mg | DELAYED_RELEASE_TABLET | Freq: Every day | ORAL | Status: DC
Start: 2013-11-21 — End: 2013-11-23
  Administered 2013-11-21 – 2013-11-23 (×3): 325 mg via ORAL
  Filled 2013-11-21 (×3): qty 1

## 2013-11-21 MED ORDER — OXYCODONE HCL 5 MG/5ML PO SOLN: 5.0000 mg | Freq: Once | ORAL | Status: AC | PRN

## 2013-11-21 MED ORDER — INSULIN GLARGINE 100 UNIT/ML SOLOSTAR PEN: 15.0000 [IU] | PEN_INJECTOR | Freq: Every day | SUBCUTANEOUS | Status: DC

## 2013-11-21 MED ORDER — METOCLOPRAMIDE HCL 5 MG/ML IJ SOLN
5.0000 mg | Freq: Three times a day (TID) | INTRAMUSCULAR | Status: DC | PRN
Start: 2013-11-21 — End: 2013-11-23

## 2013-11-21 MED ORDER — LISINOPRIL 20 MG PO TABS
20.0000 mg | ORAL_TABLET | Freq: Every day | ORAL | Status: DC
  Administered 2013-11-21 – 2013-11-23 (×3): 20 mg via ORAL
  Filled 2013-11-21 (×3): qty 1

## 2013-11-21 MED ORDER — METOCLOPRAMIDE HCL 5 MG PO TABS
5.0000 mg | ORAL_TABLET | Freq: Three times a day (TID) | ORAL | Status: DC | PRN
  Filled 2013-11-21: qty 2

## 2013-11-21 MED ORDER — INSULIN ASPART 100 UNIT/ML ~~LOC~~ SOLN
0.0000 [IU] | Freq: Three times a day (TID) | SUBCUTANEOUS | Status: DC
  Administered 2013-11-21: 2 [IU] via SUBCUTANEOUS
  Administered 2013-11-22 (×2): 3 [IU] via SUBCUTANEOUS
  Administered 2013-11-23: 2 [IU] via SUBCUTANEOUS

## 2013-11-21 MED ORDER — MIDAZOLAM HCL 5 MG/5ML IJ SOLN
INTRAMUSCULAR | Status: DC | PRN
  Administered 2013-11-21: 2 mg via INTRAVENOUS

## 2013-11-21 MED ORDER — BACLOFEN 10 MG PO TABS
10.0000 mg | ORAL_TABLET | Freq: Three times a day (TID) | ORAL | Status: DC | PRN
  Filled 2013-11-21: qty 1

## 2013-11-21 MED ORDER — LIDOCAINE HCL (CARDIAC) 20 MG/ML IV SOLN
INTRAVENOUS | Status: AC
  Filled 2013-11-21: qty 5

## 2013-11-21 MED ORDER — OXYCODONE HCL 5 MG PO TABS
5.0000 mg | ORAL_TABLET | Freq: Once | ORAL | Status: AC | PRN
  Administered 2013-11-21: 10 mg via ORAL

## 2013-11-21 MED ORDER — ONDANSETRON HCL 4 MG PO TABS: 4.0000 mg | ORAL_TABLET | Freq: Four times a day (QID) | ORAL | Status: DC | PRN

## 2013-11-21 MED ORDER — METHOCARBAMOL 500 MG PO TABS
ORAL_TABLET | ORAL | Status: AC
  Filled 2013-11-21: qty 1

## 2013-11-21 MED ORDER — 0.9 % SODIUM CHLORIDE (POUR BTL) OPTIME
TOPICAL | Status: DC | PRN
  Administered 2013-11-21: 1000 mL

## 2013-11-21 MED ORDER — METHOCARBAMOL 500 MG PO TABS
500.0000 mg | ORAL_TABLET | Freq: Four times a day (QID) | ORAL | Status: DC | PRN
  Administered 2013-11-21 – 2013-11-23 (×5): 500 mg via ORAL
  Filled 2013-11-21 (×4): qty 1

## 2013-11-21 MED ORDER — HYDROMORPHONE HCL PF 1 MG/ML IJ SOLN
1.0000 mg | INTRAMUSCULAR | Status: AC | PRN
  Administered 2013-11-21: 1 mg via INTRAVENOUS

## 2013-11-21 MED ORDER — PROPOFOL 10 MG/ML IV BOLUS
INTRAVENOUS | Status: AC
  Filled 2013-11-21: qty 20

## 2013-11-21 SURGICAL SUPPLY — 43 items
BANDAGE ESMARK 6X9 LF (GAUZE/BANDAGES/DRESSINGS) ×1 IMPLANT
BANDAGE GAUZE ELAST BULKY 4 IN (GAUZE/BANDAGES/DRESSINGS) ×3 IMPLANT
BLADE SAW RECIP 87.9 MT (BLADE) ×2 IMPLANT
BLADE SURG 21 STRL SS (BLADE) ×2 IMPLANT
BNDG CMPR 9X6 STRL LF SNTH (GAUZE/BANDAGES/DRESSINGS) ×1
BNDG COHESIVE 6X5 TAN STRL LF (GAUZE/BANDAGES/DRESSINGS) ×3 IMPLANT
BNDG ESMARK 6X9 LF (GAUZE/BANDAGES/DRESSINGS) ×2
COVER SURGICAL LIGHT HANDLE (MISCELLANEOUS) ×2 IMPLANT
CUFF TOURNIQUET SINGLE 34IN LL (TOURNIQUET CUFF) IMPLANT
CUFF TOURNIQUET SINGLE 44IN (TOURNIQUET CUFF) IMPLANT
DRAIN PENROSE 1/2X12 LTX STRL (WOUND CARE) IMPLANT
DRAPE EXTREMITY T 121X128X90 (DRAPE) ×2 IMPLANT
DRAPE PROXIMA HALF (DRAPES) ×4 IMPLANT
DRAPE U-SHAPE 47X51 STRL (DRAPES) ×4 IMPLANT
DRSG ADAPTIC 3X8 NADH LF (GAUZE/BANDAGES/DRESSINGS) ×2 IMPLANT
DRSG PAD ABDOMINAL 8X10 ST (GAUZE/BANDAGES/DRESSINGS) ×2 IMPLANT
DURAPREP 26ML APPLICATOR (WOUND CARE) ×2 IMPLANT
ELECT REM PT RETURN 9FT ADLT (ELECTROSURGICAL) ×2
ELECTRODE REM PT RTRN 9FT ADLT (ELECTROSURGICAL) ×1 IMPLANT
GLOVE BIOGEL PI IND STRL 9 (GLOVE) ×1 IMPLANT
GLOVE BIOGEL PI INDICATOR 9 (GLOVE) ×1
GLOVE SURG ORTHO 9.0 STRL STRW (GLOVE) ×2 IMPLANT
GOWN STRL REUS W/ TWL XL LVL3 (GOWN DISPOSABLE) ×2 IMPLANT
GOWN STRL REUS W/TWL XL LVL3 (GOWN DISPOSABLE) ×4
KIT BASIN OR (CUSTOM PROCEDURE TRAY) ×2 IMPLANT
KIT ROOM TURNOVER OR (KITS) ×2 IMPLANT
MANIFOLD NEPTUNE II (INSTRUMENTS) ×2 IMPLANT
NS IRRIG 1000ML POUR BTL (IV SOLUTION) ×2 IMPLANT
PACK GENERAL/GYN (CUSTOM PROCEDURE TRAY) ×2 IMPLANT
PAD ABD 8X10 STRL (GAUZE/BANDAGES/DRESSINGS) ×2 IMPLANT
PAD ARMBOARD 7.5X6 YLW CONV (MISCELLANEOUS) ×4 IMPLANT
SPONGE GAUZE 4X4 12PLY (GAUZE/BANDAGES/DRESSINGS) ×2 IMPLANT
SPONGE LAP 18X18 X RAY DECT (DISPOSABLE) IMPLANT
STAPLER VISISTAT 35W (STAPLE) IMPLANT
STOCKINETTE IMPERVIOUS LG (DRAPES) ×2 IMPLANT
SUT PDS AB 1 CT  36 (SUTURE)
SUT PDS AB 1 CT 36 (SUTURE) IMPLANT
SUT SILK 2 0 (SUTURE) ×2
SUT SILK 2-0 18XBRD TIE 12 (SUTURE) ×1 IMPLANT
TOWEL OR 17X24 6PK STRL BLUE (TOWEL DISPOSABLE) ×2 IMPLANT
TOWEL OR 17X26 10 PK STRL BLUE (TOWEL DISPOSABLE) ×2 IMPLANT
TUBE ANAEROBIC SPECIMEN COL (MISCELLANEOUS) IMPLANT
WATER STERILE IRR 1000ML POUR (IV SOLUTION) ×2 IMPLANT

## 2013-11-21 NOTE — Op Note (Signed)
OPERATIVE REPORT  DATE OF SURGERY: 11/21/2013  PATIENT:  Mark [name],  63 y.o. male  PRE-OPERATIVE DIAGNOSIS:  Left Foot Gangrene  POST-OPERATIVE DIAGNOSIS:  Left Foot Gangrene  PROCEDURE:  Procedure(s): AMPUTATION BELOW KNEE  SURGEON:  Surgeon(s): Newt Minion, MD  ANESTHESIA:   general  EBL:  min ML  SPECIMEN:  Source of Specimen:  Left leg  TOURNIQUET:   Total Tourniquet Time Documented: Thigh (Left) - 10 minutes Total: Thigh (Left) - 10 minutes   PROCEDURE DETAILS: Patient is a 63 year old gentleman who is status post foot salvage surgery on the left he has progressive dehiscence of the wound necrosis and does not have viable foot salvage options and presents at this time for transtibial amputation. Risks and benefits of surgery were discussed including infection nonhealing wound need for additional surgery. Hayden Pedro states he understands and wished to proceed at this time. Description of procedure patient was brought to the operating room and underwent a general anesthetic. After adequate levels of anesthesia were obtained left lower extremity was prepped using DuraPrep draped in a sterile field and the foot was draped out of sterile field with an impervious stockinette. A timeout was called. A transverse incision was made transverse 11 cm distal to the tibial tubercle this curved proximally and a large posterior flap was created. The tibia was transected and beveled just proximal to the skin incision the fibula was transected and beveled just proximal to the tibial incision. A knife was used to create a large posterior flap. The sciatic nerve was pulled cut and allowed to retract. The vascular bundles were suture ligated with 2-0 silk. The tourniquet was deflated hemostasis was obtained. The deep and superficial fascial layers were closed using #1 PDS. The skin was closed using staples. Wound was covered Adaptic orthopedic sponges AB dressing Kerlix and Coban. Patient was  extubated taken to the PACU in stable condition.  PLAN OF CARE: Admit to inpatient   PATIENT DISPOSITION:  PACU - hemodynamically stable.   Newt Minion, MD 11/21/2013 11:22 AM
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
## 2013-11-21 NOTE — Progress Notes (Signed)
Utilization review completed.  

## 2013-11-21 NOTE — Anesthesia Postprocedure Evaluation (Signed)
  Anesthesia Post-op Note  Patient: Mark [name]  Procedure(s) Performed: Procedure(s) with comments: AMPUTATION BELOW KNEE (Left) - Left Below Knee Amputation  Patient Location: PACU  Anesthesia Type:General  Level of Consciousness: awake, alert  and oriented  Airway and Oxygen Therapy: Patient Spontanous Breathing  Post-op Pain: moderate  Post-op Assessment: Post-op Vital signs reviewed  Post-op Vital Signs: Reviewed  Complications: No apparent anesthesia complications

## 2013-11-21 NOTE — Anesthesia Procedure Notes (Signed)
Procedure Name: LMA Insertion Date/Time: 11/21/2013 10:38 AM Performed by: Sampson Si E Pre-anesthesia Checklist: Patient identified, Emergency Drugs available, Suction available, Patient being monitored and Timeout performed Patient Re-evaluated:Patient Re-evaluated prior to inductionOxygen Delivery Method: Circle system utilized Preoxygenation: Pre-oxygenation with 100% oxygen Intubation Type: IV induction LMA: LMA inserted LMA Size: 5.0 Number of attempts: 1 Placement Confirmation: positive ETCO2 and breath sounds checked- equal and bilateral Tube secured with: Tape Dental Injury: Teeth and Oropharynx as per pre-operative assessment

## 2013-11-21 NOTE — Anesthesia Preprocedure Evaluation (Signed)
Anesthesia Evaluation  Patient identified by MRN, date of birth, ID band Patient awake    Reviewed: Allergy & Precautions, H&P , NPO status , Patient's Chart, lab work & pertinent test results  Airway Mallampati: II TM Distance: >3 FB Neck ROM: Full    Dental  (+) Partial Upper, Teeth Intact, Dental Advisory Given   Pulmonary former smoker,  breath sounds clear to auscultation        Cardiovascular hypertension, Pt. on medications Rhythm:Regular Rate:Normal     Neuro/Psych    GI/Hepatic   Endo/Other  diabetes, Well Controlled, Type 2, Insulin Dependent  Renal/GU      Musculoskeletal   Abdominal   Peds  Hematology   Anesthesia Other Findings   Reproductive/Obstetrics                           Anesthesia Physical Anesthesia Plan  ASA: III  Anesthesia Plan: General   Post-op Pain Management:    Induction: Intravenous  Airway Management Planned: LMA  Additional Equipment:   Intra-op Plan:   Post-operative Plan: Extubation in OR  Informed Consent: I have reviewed the patients History and Physical, chart, labs and discussed the procedure including the risks, benefits and alternatives for the proposed anesthesia with the patient or authorized representative who has indicated his/her understanding and acceptance.   Dental advisory given  Plan Discussed with: CRNA, Anesthesiologist and Surgeon  Anesthesia Plan Comments:         Anesthesia Quick Evaluation

## 2013-11-21 NOTE — Transfer of Care (Signed)
Immediate Anesthesia Transfer of Care Note  Patient: Mark [name]  Procedure(s) Performed: Procedure(s) with comments: AMPUTATION BELOW KNEE (Left) - Left Below Knee Amputation  Patient Location: PACU  Anesthesia Type:General  Level of Consciousness: awake  Airway & Oxygen Therapy: Patient Spontanous Breathing and Patient connected to nasal cannula oxygen  Post-op Assessment: Report given to PACU RN, Post -op Vital signs reviewed and stable and Patient moving all extremities  Post vital signs: Reviewed and stable  Complications: No apparent anesthesia complications

## 2013-11-22 DIAGNOSIS — L98499 Non-pressure chronic ulcer of skin of other sites with unspecified severity: Secondary | ICD-10-CM

## 2013-11-22 DIAGNOSIS — I739 Peripheral vascular disease, unspecified: Secondary | ICD-10-CM

## 2013-11-22 DIAGNOSIS — S88119A Complete traumatic amputation at level between knee and ankle, unspecified lower leg, initial encounter: Secondary | ICD-10-CM

## 2013-11-22 LAB — TYPE AND SCREEN
ABO/RH(D): O POS
Antibody Screen: NEGATIVE
Unit division: 0
Unit division: 0

## 2013-11-22 LAB — GLUCOSE, CAPILLARY
Glucose-Capillary: 170 mg/dL — ABNORMAL HIGH (ref 70–99)
Glucose-Capillary: 78 mg/dL (ref 70–99)
Glucose-Capillary: 108 mg/dL — ABNORMAL HIGH (ref 70–99)
Glucose-Capillary: 156 mg/dL — ABNORMAL HIGH (ref 70–99)
Glucose-Capillary: 142 mg/dL — ABNORMAL HIGH (ref 70–99)

## 2013-11-22 LAB — HEMOGLOBIN AND HEMATOCRIT, BLOOD
HCT: 25.8 % — ABNORMAL LOW (ref 39.0–52.0)
Hemoglobin: 8.4 g/dL — ABNORMAL LOW (ref 13.0–17.0)

## 2013-11-22 NOTE — Progress Notes (Signed)
Patient ID: [name] [name], male   DOB: 05-28-1951, 63 y.o.   MRN: [id number] Postoperative day 1 left transtibial amputation. Patient complains of phantom pain. Physical therapy progressive ambulation. Anticipate discharge to skilled nursing.

## 2013-11-22 NOTE — Plan of Care (Signed)
Problem: Phase I Progression Outcomes Goal: OOB as tolerated unless otherwise ordered Outcome: Progressing PT to eval in am
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
## 2013-11-22 NOTE — Evaluation (Signed)
Physical Therapy Evaluation Patient Details Name: [name] [name] MRN: [id number] DOB: 1951-06-01 Today's Date: 11/22/2013   History of Present Illness  Pt with previous L Transmetatarsal amputation on 3/11 for infection. 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   Clinical Impression  Patient is s/p left transtibial amputation surgery resulting in functional limitations due to the deficits listed below (see PT Problem List). Patient will benefit from skilled PT to increase their independence and safety with mobility to allow discharge to the venue listed below.  Pt states he will not be able to go home safely due to furniture getting in his way and requests further rehab from SNF before d/c home.     Follow Up Recommendations SNF;Supervision/Assistance - 24 hour    Equipment Recommendations  3in1 (PT)    Recommendations for Other Services OT consult     Precautions / Restrictions Precautions Precautions: Fall Restrictions Weight Bearing Restrictions: Yes LLE Weight Bearing: Non weight bearing      Mobility  Bed Mobility Overal bed mobility: Needs Assistance Bed Mobility: Supine to Sit     Supine to sit: Min guard;HOB elevated     General bed mobility comments: Pt did not require physical assist for supine>sit; verbal cues provided for technique; HOB elevated and used rail.  Transfers Overall transfer level: Needs assistance Equipment used: Rolling walker (2 wheeled) Transfers: Sit to/from Stand Sit to Stand: Min assist         General transfer comment: Min assist to steady RW, sit>stand from elevated bed suraface.  Ambulation/Gait Ambulation/Gait assistance: Min assist Ambulation Distance (Feet): 10 Feet (x 2 ) Assistive device: Rolling walker (2 wheeled) Gait Pattern/deviations:  ("hop-to pattern") Gait velocity: decreased   General Gait Details: Pt able to ambulate up to 10 feet including turns. Needed an immediate seated rest break onto commode and was not able to  safely control his descent requring min assist. verbal cues for sequencing of gait and min assist to steady rw.  Stairs            Wheelchair Mobility    Modified Rankin (Stroke Patients Only)       Balance Overall balance assessment: Needs assistance Sitting-balance support: No upper extremity supported Sitting balance-Leahy Scale: Fair Sitting balance - Comments: Sits EOB without support   Standing balance support: Bilateral upper extremity supported Standing balance-Leahy Scale: Poor Standing balance comment: Requires RW for support/balance                     Pertinent Vitals/Pain Pt reports he is not in any pain at the moment Pt repositioned in chair for comfort.    Home Living Family/patient expects to be discharged to:: Skilled nursing facility Living Arrangements: Spouse/significant other Available Help at Discharge: Family;Available 24 hours/day Type of Home: House Home Access: Stairs to enter Entrance Stairs-Rails: None Entrance Stairs-Number of Steps: 2 Home Layout: One level Home Equipment: Tub bench;Wheelchair - [employer] - 2 wheels      Prior Function Level of Independence: Independent with assistive device(s) (Rolling walker/ wheelchair)               Hand Dominance   Dominant Hand: Right    Extremity/Trunk Assessment   Upper Extremity Assessment: Defer to OT evaluation           Lower Extremity Assessment: LLE deficits/detail   LLE Deficits / Details: transtibial amputation     Communication   Communication: HOH  Cognition Arousal/Alertness: Awake/alert Behavior During Therapy: [facility]  for tasks assessed/performed Overall Cognitive Status: Within Functional Limits for tasks assessed                      General Comments General comments (skin integrity, edema, etc.): Pt had bowel movement in bed prior to amb, he was cleaned and requested to use bedside commode but no additional bowel movement    Exercises  Amputee Exercises Quad Sets: AROM;Left;10 reps;Seated Hip Extension: AROM;Left;10 reps;Standing Hip Flexion/Marching: AROM;Left;10 reps;Standing Knee Flexion: AROM;Left;10 reps;Seated Knee Extension: AROM;Left;10 reps;Seated      Assessment/Plan    PT Assessment Patient needs continued PT services  PT Diagnosis Difficulty walking;Abnormality of gait;Generalized weakness;Acute pain   PT Problem List Decreased strength;Decreased range of motion;Decreased activity tolerance;Decreased balance;Decreased mobility;Decreased knowledge of use of DME;Decreased knowledge of precautions;Pain  PT Treatment Interventions DME instruction;Gait training;Functional mobility training;Therapeutic activities;Therapeutic exercise;Stair training;Balance training;Neuromuscular re-education;Patient/family education;Modalities   PT Goals (Current goals can be found in the Care Plan section) Acute Rehab PT Goals Patient Stated Goal: To get more rehab at SNF before returning home PT Goal Formulation: With patient Time For Goal Achievement: 11/29/13 Potential to Achieve Goals: Good    Frequency Min 3X/week   Barriers to discharge Other (comment);Inaccessible home environment (Pt does not with to d/c home as he is afraid he will fall) Pt states he has too much furniture in his home to safely ambulate, and requests to d/c for further rehab    End of Session Equipment Utilized During Treatment: Gait belt Activity Tolerance: Patient tolerated treatment well Patient left: in chair;with call bell/phone within reach;with family/visitor present         Time: 1134-1205 PT Time Calculation (min): 31 min   Charges:   PT Evaluation $Initial PT Evaluation Tier I: 1 Procedure PT Treatments $Gait Training: 8-22 mins   PT G Codes:        Elayne Snare, [place]   Ellouise Newer 11/22/2013, 2:14 PM

## 2013-11-22 NOTE — Care Management Note (Signed)
CARE MANAGEMENT NOTE 11/22/2013  Patient:  [name] [name]   Account Number:  [id number]  Date Initiated:  11/22/2013  Documentation initiated by:  [name] [name]  Subjective/Objective Assessment:   63 yr old male admitted with Left foot gangrene, s/p left BKA.     Action/Plan:   Case manager spoke with patient concerning home health and DME needs at discharge.Patient is active with [facility]. Has wheelchair and walker at home. Lives with wife.   Anticipated DC Date:  11/23/2013   Anticipated DC Plan:  [place]  CM consult      [facility] Choice  Resumption Of Svcs/PTA Provider  HOME HEALTH   Choice offered to / List presented to:  [name]        [place] arranged  HH-2 PT      [place].   Status of service:  In process, will continue to follow Medicare Important Message given?   (If response is "NO", the following Medicare IM given date fields will be blank) Date Medicare IM given:   Date Additional Medicare IM given:    Discharge Disposition:    Per UR Regulation:    If discussed at Long Length of Stay Meetings, dates discussed:    Comments:

## 2013-11-22 NOTE — Progress Notes (Signed)
Occupational Therapy Evaluation Patient Details Name: CAFFREY HERDMAN MRN: [id number] DOB: [birth year] Today's Date: 11/22/2013    History of Present Illness Pt with previous L Transmetatarsal amputation on 3/11 for infection. 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    Clinical Impression   PTA pt lived at home with wife and was mod I for ADLs with RW. Education and training provided regarding precautions and compensatory techniques for LB ADLs. Pt was previously NWB on LLE and familiar with LB ADL techniques. Pt with support at home but would benefit from SNF for increased strength and independence with ADLs.     Follow Up Recommendations  SNF;Supervision/Assistance - 24 hour    Equipment Recommendations  None recommended by OT       Precautions / Restrictions Precautions Precautions: Fall Restrictions Weight Bearing Restrictions: Yes LLE Weight Bearing: Non weight bearing      Mobility Bed Mobility Overal bed mobility: Needs Assistance Bed Mobility: Supine to Sit     Supine to sit: Min guard;HOB elevated        Transfers Overall transfer level: Needs assistance Equipment used: Rolling walker (2 wheeled)   Sit to Stand: Min assist                   ADL Eating/Feeding: Independent;Sitting Grooming: Set up;Sitting   Upper Body Dressing : Set up;Sitting             General ADL Comments: Pt with previous NWB status on LLE; pt familiar with WB precautions and compensatory techniques for LB ADLs.                           Communication Communication Communication: No difficulties   Cognition Arousal/Alertness: Lethargic;Suspect due to medications (received pain medications about 5 minutes prior to OT visit) Behavior During Therapy: [facility] for tasks assessed/performed Overall Cognitive Status: Within Functional Limits for tasks assessed                        Exercises Exercises: General Upper Extremity (Pt provided with level 2 therabands,  tied to upper handrails of bed and demonstrated use for shoulder strengthening)    Home Living Family/patient expects to be discharged to:: Private residence Living Arrangements: Spouse/significant other Available Help at Discharge: Family;Available 24 hours/day Type of Home: House Home Access: Stairs to enter [employer] of Steps: 2 Entrance Stairs-Rails: None Home Layout: One level     Bathroom Shower/Tub: Tub/shower unit;Curtain Shower/tub characteristics: Architectural technologist: Standard     Home Equipment: Tub bench;Wheelchair - [employer] - 2 wheels          Prior Functioning/Environment Level of Independence: Independent with assistive device(s)             OT Diagnosis:  Generalized weakness, acute pain   OT Problem List: Decreased strength;Decreased range of motion;Decreased activity tolerance;Impaired balance (sitting and/or standing);Decreased safety awareness;Pain;Decreased knowledge of use of DME or AE;Decreased knowledge of precautions                    End of Session: Equipment Utilized During Treatment: Rolling walker  Activity Tolerance: Patient tolerated treatment well Patient left: in bed;with call bell/phone within reach;with family/visitor present   Time: 0950-1015 OT Time Calculation (min): 25 min Charges:  OT General Charges $OT Visit: 1 Procedure OT Evaluation $Initial OT Evaluation Tier I: 1 Procedure OT Treatments $Self Care/Home Management : 8-22 mins  Juluis Rainier [id number] 11/22/2013, 3:48 PM

## 2013-11-22 NOTE — Progress Notes (Signed)
Rehab admissions - Evaluated for possible admission.  Please see rehab consult done today recommending HH therapies.  Likely can go home with [facility] follow up when medically stable.  Call me for questions.  [phone]

## 2013-11-23 ENCOUNTER — Encounter (HOSPITAL_COMMUNITY): Payer: Self-pay | Admitting: Orthopedic Surgery

## 2013-11-23 LAB — GLUCOSE, CAPILLARY
Glucose-Capillary: 107 mg/dL — ABNORMAL HIGH (ref 70–99)
Glucose-Capillary: 131 mg/dL — ABNORMAL HIGH (ref 70–99)

## 2013-11-23 MED ORDER — OXYCODONE-ACETAMINOPHEN 5-325 MG PO TABS: 1.0000 | ORAL_TABLET | ORAL | Status: DC | PRN

## 2013-11-23 NOTE — Progress Notes (Signed)
Patient ID: Mark [name], male   DOB: [birth year], 63 y.o.   MRN: [id number] Therapy has recommended patient to be discharged to home with home health physical therapy. Patient states he's unstable and unable to ambulate with a walker at this time. Possible discharge to home this weekend restrictions on the chart for Percocet.

## 2013-11-23 NOTE — Progress Notes (Signed)
Physical Therapy Treatment Patient Details Name: Mark [name] MRN: [id number] DOB: 1951/05/24 Today's Date: 11/23/2013    History of Present Illness Pt with previous L Transmetatarsal amputation on 3/11 for infection. 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     PT Comments    Pt tolerated treatment well and has completed stair training similar to home environment. Pt will greatly benefit from continued PT in home setting to improve independence with functional mobility. Pt states he used a wheelchair for primary mobility with intermittent RW use prior to most recent hospital admission and PT agrees he is appropriate for d/c home with continued therapy. All education has been covered and pt has no questions for PT.  Follow Up Recommendations  Home health PT     Equipment Recommendations  3in1 (PT)    Recommendations for Other Services       Precautions / Restrictions Precautions Precautions: Fall Restrictions Weight Bearing Restrictions: Yes LLE Weight Bearing: Non weight bearing    Mobility  Bed Mobility Overal bed mobility: Needs Assistance Bed Mobility: Supine to Sit     Supine to sit: Supervision     General bed mobility comments: Supervision for safety, did not need physical assist. Cues for technique, and HOB flat no use of handrail  Transfers Overall transfer level: Needs assistance Equipment used: Rolling walker (2 wheeled) Transfers: Sit to/from Stand Sit to Stand: Min guard         General transfer comment: Sit>stand from lowest bed setting with min guard for safety, verbal cues for hand placement. Pt shows good control of RW without LOB  Ambulation/Gait Ambulation/Gait assistance: Min guard Ambulation Distance (Feet): 75 Feet Assistive device: Rolling walker (2 wheeled) Gait Pattern/deviations: Antalgic;Trunk flexed ("hop-to" pattern) Gait velocity: decreased   General Gait Details: Pt ambulates 15 feet further than required for home (carport to house)  and reports he feels comfortable doing so without assistance. PT recommended he use wheelchair from carport to house until [place] practices on various terrain; pt agrees. Verbal cues for upright posture with min guard for safety. Good control of RW.   Stairs Stairs: Yes Stairs assistance: Min guard Stair Management: No rails;Step to pattern;Backwards;With walker Number of Stairs: 1 (x2) General stair comments: demonstrates safe stair negotiation and states he is comfortable with this technique as he has been using the backwards approach NWB for quite some time. Min guard for safety, verbal cues for technique  Wheelchair Mobility    Modified Rankin (Stroke Patients Only)       Balance                                    Cognition Arousal/Alertness: Awake/alert Behavior During Therapy: WFL for tasks assessed/performed Overall Cognitive Status: Within Functional Limits for tasks assessed                      Exercises Amputee Exercises Quad Sets: AROM;Left;10 reps;Seated Hip Extension: AROM;Left;Standing;5 reps Hip Flexion/Marching: AROM;Left;Standing;5 reps Knee Extension: AROM;Left;Standing;5 reps    General Comments        Pertinent Vitals/Pain 8/10 pain Nurse notified and aware Pt repositioned for comfort in chair    Home Living                      Prior Function            PT Goals (current goals can  now be found in the care plan section) Acute Rehab PT Goals PT Goal Formulation: With patient Time For Goal Achievement: 11/29/13 Potential to Achieve Goals: Good Progress towards PT goals: Progressing toward goals    Frequency  Min 3X/week    PT Plan Current plan remains appropriate    End of Session Equipment Utilized During Treatment: Gait belt Activity Tolerance: Patient tolerated treatment well Patient left: in chair;with call bell/phone within reach     Time: 1038-1110 PT Time Calculation (min): 32 min  Charges:   $Gait Training: 8-22 mins $Therapeutic Exercise: 8-22 mins                    G Codes:      [employer], [place]  Mark [name] 11/23/2013, 1:11 PM

## 2013-11-23 NOTE — Discharge Summary (Signed)
Physician Discharge Summary  Patient ID: AKIM STEGER MRN: [id number] DOB/AGE: February 22, 1951 63 y.o.  Admit date: 11/21/2013 Discharge date: 11/23/2013  Admission Diagnoses: Left foot gangrene and osteomyelitis  Discharge Diagnoses: Same Active Problems:   S/P BKA (below knee amputation)   Discharged Condition: stable  Hospital Course: Patient's hospital course was essentially unremarkable he underwent a transtibial amputation. He progressed slowly with therapy prior to discharge  Consults: None  Significant Diagnostic Studies: labs: Routine labs  Treatments: surgery: See operative note  Discharge Exam: Blood pressure 119/67, pulse 78, temperature 99.1 F (37.3 C), temperature source Oral, resp. rate 18, height 6\' 1"  (1.854 m), weight 84.823 kg (187 lb), SpO2 97.00%. Incision/Wound: dressing clean dry and intact  Disposition: 01-Home or Self Care   Future Appointments Provider Department Dept Phone   12/12/2013 10:20 AM Meredith Staggers, MD [facility] [phone]       Medication List    ASK your doctor about these medications       acetaminophen 500 MG tablet  Commonly known as:  TYLENOL  Take 500 mg by mouth every 6 (six) hours as needed for mild pain.     baclofen 10 MG tablet  Commonly known as:  LIORESAL  Take 1 tablet (10 mg total) by mouth 3 (three) times daily as needed for muscle spasms (hiccups).     ferrous gluconate 324 MG tablet  Commonly known as:  FERGON  Take 1 tablet (324 mg total) by mouth 2 (two) times daily with a meal.     insulin aspart 100 UNIT/ML injection  Commonly known as:  novoLOG  Inject 3 Units into the skin 3 (three) times daily with meals.     Insulin Glargine 100 UNIT/ML Solostar Pen  Commonly known as:  LANTUS SOLOSTAR  Inject 15 Units into the skin daily at 10 pm.     lisinopril 20 MG tablet  Commonly known as:  PRINIVIL,ZESTRIL  Take 1 tablet (20 mg total) by mouth daily.     oxyCODONE 5 MG immediate release tablet  Commonly known as:  Oxy IR/ROXICODONE  Take 1 tablet (5 mg total) by mouth every 4 (four) hours as needed for moderate pain.           Follow-up Information   Follow up with DUDA,MARCUS V, MD In 1 week.   Specialty:  Orthopedic Surgery   Contact information:   [place] [address] [phone]       Signed: Newt Minion 11/23/2013, 6:43 AM

## 2013-11-27 ENCOUNTER — Other Ambulatory Visit: Payer: Self-pay | Admitting: Internal Medicine

## 2013-11-27 MED ORDER — INSULIN GLARGINE 100 UNIT/ML SOLOSTAR PEN: 15.0000 [IU] | PEN_INJECTOR | Freq: Every day | SUBCUTANEOUS | Status: DC

## 2013-11-27 MED ORDER — INSULIN ASPART 100 UNIT/ML ~~LOC~~ SOLN: 3.0000 [IU] | Freq: Three times a day (TID) | SUBCUTANEOUS | Status: DC

## 2013-12-11 ENCOUNTER — Encounter: Payer: Self-pay | Admitting: *Deleted

## 2013-12-11 DIAGNOSIS — Z0000 Encounter for general adult medical examination without abnormal findings: Secondary | ICD-10-CM

## 2013-12-11 NOTE — Progress Notes (Signed)
Pt called requesting a referral to podiatry. I put in the referral and also had him make another appointment.

## 2013-12-12 ENCOUNTER — Encounter: Payer: Self-pay | Admitting: Physical Medicine & Rehabilitation

## 2013-12-12 ENCOUNTER — Encounter: Payer: Medicaid Other | Attending: Physical Medicine & Rehabilitation | Admitting: Physical Medicine & Rehabilitation

## 2013-12-12 VITALS — BP 140/70 | HR 86 | Resp 14 | Ht 73.0 in | Wt 186.0 lb

## 2013-12-12 DIAGNOSIS — G547 Phantom limb syndrome without pain: Secondary | ICD-10-CM

## 2013-12-12 DIAGNOSIS — Z89519 Acquired absence of unspecified leg below knee: Secondary | ICD-10-CM

## 2013-12-12 DIAGNOSIS — IMO0002 Reserved for concepts with insufficient information to code with codable children: Secondary | ICD-10-CM

## 2013-12-12 DIAGNOSIS — E119 Type 2 diabetes mellitus without complications: Secondary | ICD-10-CM | POA: Insufficient documentation

## 2013-12-12 DIAGNOSIS — M62838 Other muscle spasm: Secondary | ICD-10-CM | POA: Insufficient documentation

## 2013-12-12 DIAGNOSIS — E118 Type 2 diabetes mellitus with unspecified complications: Secondary | ICD-10-CM

## 2013-12-12 DIAGNOSIS — E1165 Type 2 diabetes mellitus with hyperglycemia: Secondary | ICD-10-CM

## 2013-12-12 DIAGNOSIS — G546 Phantom limb syndrome with pain: Secondary | ICD-10-CM | POA: Insufficient documentation

## 2013-12-12 DIAGNOSIS — I10 Essential (primary) hypertension: Secondary | ICD-10-CM | POA: Insufficient documentation

## 2013-12-12 DIAGNOSIS — S88119A Complete traumatic amputation at level between knee and ankle, unspecified lower leg, initial encounter: Secondary | ICD-10-CM | POA: Insufficient documentation

## 2013-12-12 DIAGNOSIS — Z87891 Personal history of nicotine dependence: Secondary | ICD-10-CM | POA: Insufficient documentation

## 2013-12-12 DIAGNOSIS — Z9089 Acquired absence of other organs: Secondary | ICD-10-CM | POA: Insufficient documentation

## 2013-12-12 HISTORY — DX: Phantom limb syndrome with pain: G54.6

## 2013-12-12 MED ORDER — GABAPENTIN 100 MG PO CAPS: 100.0000 mg | ORAL_CAPSULE | ORAL | Status: DC
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
## 2013-12-12 NOTE — Patient Instructions (Addendum)
MAKE SURE YOUR LEG IS WELL HEALED BEFORE YOU BEGIN WEARING A PROSTHESIS  USE A 4X4 OVER THE OPEN AREAS ON YOUR WOUND---DON'T TAPE OVER THE WOUND ITSELF

## 2014-01-23 ENCOUNTER — Other Ambulatory Visit: Payer: Self-pay

## 2014-01-23 MED ORDER — GLUCOSE BLOOD VI STRP: ORAL_STRIP | Status: DC

## 2014-01-23 MED ORDER — ACCU-CHEK AVIVA PLUS W/DEVICE KIT: PACK | Status: DC

## 2014-01-23 MED ORDER — ACCU-CHEK SOFT TOUCH LANCETS MISC: Status: DC

## 2014-02-11 ENCOUNTER — Ambulatory Visit: Payer: Self-pay | Admitting: Physical Medicine & Rehabilitation

## 2014-02-14 ENCOUNTER — Encounter: Payer: Medicaid Other | Attending: Physical Medicine & Rehabilitation | Admitting: Registered Nurse

## 2014-02-14 ENCOUNTER — Encounter: Payer: Self-pay | Admitting: Registered Nurse

## 2014-02-14 VITALS — BP 170/95 | HR 89 | Resp 16

## 2014-02-14 DIAGNOSIS — I10 Essential (primary) hypertension: Secondary | ICD-10-CM | POA: Diagnosis present

## 2014-02-14 MED ORDER — LISINOPRIL 20 MG PO TABS: 20.0000 mg | ORAL_TABLET | Freq: Every day | ORAL | Status: DC
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
## 2014-02-15 ENCOUNTER — Telehealth: Payer: Self-pay

## 2014-02-15 LAB — BASIC METABOLIC PANEL WITH GFR
BUN: 11 mg/dL (ref 6–23)
CO2: 25 mEq/L (ref 19–32)
Calcium: 9.6 mg/dL (ref 8.4–10.5)
Chloride: 108 mEq/L (ref 96–112)
Creat: 0.99 mg/dL (ref 0.50–1.35)
GFR, Est African American: >89 mL/min
GFR, Est Non African American: 81 mL/min
Glucose, Bld: 20 mg/dL — CL (ref 70–99)
Potassium: 3.9 mEq/L (ref 3.5–5.3)
Sodium: 146 mEq/L — ABNORMAL HIGH (ref 135–145)

## 2014-02-15 NOTE — Telephone Encounter (Signed)
Ebony Hail with [facility] called with a critical lab result for patient. Patient's Glucose was 20. Ebony Hail states the test was repeated and verified, no visible hemolysis. Due to SST being un spun had prolong contact with red blood cells. Notified Eunice immediately. Zella Ball recommended contacting the patient to see how he was feeling. She also wanted to advise patient to FU with his PCP or go to the ER. Contacted patient. Patient states he felt feel yesterday and also today. He was going to check his glucose when we hung up. Advised patient to contact us if his levels were out of range. Also advised patient per Zella Ball she would like for him to FU with his PCP or go to ER. Patient verbalized understanding.

## 2014-03-12 ENCOUNTER — Ambulatory Visit: Payer: Medicaid Other | Attending: Orthopedic Surgery | Admitting: Physical Therapy

## 2014-03-12 DIAGNOSIS — S88119A Complete traumatic amputation at level between knee and ankle, unspecified lower leg, initial encounter: Secondary | ICD-10-CM | POA: Diagnosis not present

## 2014-03-12 DIAGNOSIS — Z4789 Encounter for other orthopedic aftercare: Secondary | ICD-10-CM | POA: Diagnosis present

## 2014-03-12 DIAGNOSIS — R269 Unspecified abnormalities of gait and mobility: Secondary | ICD-10-CM | POA: Insufficient documentation

## 2014-03-12 DIAGNOSIS — R5381 Other malaise: Secondary | ICD-10-CM | POA: Diagnosis not present

## 2014-03-21 ENCOUNTER — Ambulatory Visit: Payer: Medicaid Other | Admitting: Physical Therapy

## 2014-03-21 DIAGNOSIS — Z4789 Encounter for other orthopedic aftercare: Secondary | ICD-10-CM | POA: Diagnosis not present

## 2014-03-28 ENCOUNTER — Ambulatory Visit: Payer: Medicaid Other | Admitting: Physical Therapy

## 2014-03-28 DIAGNOSIS — Z4789 Encounter for other orthopedic aftercare: Secondary | ICD-10-CM | POA: Diagnosis not present

## 2014-04-04 ENCOUNTER — Ambulatory Visit: Payer: Medicaid Other | Attending: Orthopedic Surgery | Admitting: Physical Therapy

## 2014-04-04 DIAGNOSIS — R5381 Other malaise: Secondary | ICD-10-CM | POA: Insufficient documentation

## 2014-04-04 DIAGNOSIS — R269 Unspecified abnormalities of gait and mobility: Secondary | ICD-10-CM | POA: Insufficient documentation

## 2014-04-04 DIAGNOSIS — S88119A Complete traumatic amputation at level between knee and ankle, unspecified lower leg, initial encounter: Secondary | ICD-10-CM | POA: Diagnosis not present

## 2014-04-04 DIAGNOSIS — Z4789 Encounter for other orthopedic aftercare: Secondary | ICD-10-CM | POA: Diagnosis not present

## 2014-04-08 ENCOUNTER — Ambulatory Visit: Payer: Medicaid Other | Attending: Internal Medicine | Admitting: Internal Medicine

## 2014-04-08 ENCOUNTER — Encounter: Payer: Self-pay | Admitting: Internal Medicine

## 2014-04-08 VITALS — BP 124/71 | HR 87 | Temp 98.1°F | Resp 16 | Ht 73.0 in | Wt 180.0 lb

## 2014-04-08 DIAGNOSIS — S88119A Complete traumatic amputation at level between knee and ankle, unspecified lower leg, initial encounter: Secondary | ICD-10-CM | POA: Diagnosis not present

## 2014-04-08 DIAGNOSIS — I10 Essential (primary) hypertension: Secondary | ICD-10-CM

## 2014-04-08 DIAGNOSIS — Z794 Long term (current) use of insulin: Secondary | ICD-10-CM | POA: Diagnosis not present

## 2014-04-08 DIAGNOSIS — Z87891 Personal history of nicotine dependence: Secondary | ICD-10-CM | POA: Diagnosis not present

## 2014-04-08 DIAGNOSIS — E1169 Type 2 diabetes mellitus with other specified complication: Secondary | ICD-10-CM | POA: Insufficient documentation

## 2014-04-08 DIAGNOSIS — E118 Type 2 diabetes mellitus with unspecified complications: Secondary | ICD-10-CM

## 2014-04-08 DIAGNOSIS — E162 Hypoglycemia, unspecified: Secondary | ICD-10-CM

## 2014-04-08 DIAGNOSIS — Z9089 Acquired absence of other organs: Secondary | ICD-10-CM | POA: Insufficient documentation

## 2014-04-08 DIAGNOSIS — E1165 Type 2 diabetes mellitus with hyperglycemia: Secondary | ICD-10-CM

## 2014-04-08 DIAGNOSIS — IMO0002 Reserved for concepts with insufficient information to code with codable children: Secondary | ICD-10-CM

## 2014-04-08 HISTORY — DX: Essential (primary) hypertension: I10

## 2014-04-08 LAB — POCT GLYCOSYLATED HEMOGLOBIN (HGB A1C): Hemoglobin A1C: 6.2

## 2014-04-08 LAB — GLUCOSE, POCT (MANUAL RESULT ENTRY)
POC Glucose: 66 mg/dl — AB (ref 70–99)
POC Glucose: 127 mg/dl — AB (ref 70–99)

## 2014-04-08 MED ORDER — INSULIN GLARGINE 100 UNIT/ML SOLOSTAR PEN: 10.0000 [IU] | PEN_INJECTOR | Freq: Every day | SUBCUTANEOUS | Status: DC

## 2014-04-08 MED ORDER — METFORMIN HCL 500 MG PO TABS: 500.0000 mg | ORAL_TABLET | Freq: Two times a day (BID) | ORAL | Status: DC

## 2014-04-08 NOTE — Progress Notes (Signed)
Patient here to follow up on his diabetes.  He is feeling bad today due to low blood sugar.  He said he ate a piece of candy to help.
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
## 2014-04-11 ENCOUNTER — Ambulatory Visit: Payer: Medicaid Other | Admitting: Physical Therapy

## 2014-04-11 DIAGNOSIS — Z4789 Encounter for other orthopedic aftercare: Secondary | ICD-10-CM | POA: Diagnosis not present

## 2014-04-18 ENCOUNTER — Ambulatory Visit: Payer: Medicaid Other | Admitting: Physical Therapy

## 2014-04-18 DIAGNOSIS — Z4789 Encounter for other orthopedic aftercare: Secondary | ICD-10-CM | POA: Diagnosis not present

## 2014-04-23 ENCOUNTER — Encounter: Payer: Medicaid Other | Attending: Physical Medicine & Rehabilitation | Admitting: Physical Medicine & Rehabilitation

## 2014-04-23 ENCOUNTER — Encounter: Payer: Self-pay | Admitting: Physical Medicine & Rehabilitation

## 2014-04-23 VITALS — BP 159/81 | HR 84 | Resp 14 | Ht 74.0 in | Wt 190.0 lb

## 2014-04-23 DIAGNOSIS — S88119A Complete traumatic amputation at level between knee and ankle, unspecified lower leg, initial encounter: Secondary | ICD-10-CM | POA: Diagnosis present

## 2014-04-23 DIAGNOSIS — G547 Phantom limb syndrome without pain: Secondary | ICD-10-CM | POA: Diagnosis present

## 2014-04-23 DIAGNOSIS — Z89512 Acquired absence of left leg below knee: Secondary | ICD-10-CM

## 2014-04-23 DIAGNOSIS — G546 Phantom limb syndrome with pain: Secondary | ICD-10-CM

## 2014-04-23 MED ORDER — GABAPENTIN 300 MG PO CAPS: 300.0000 mg | ORAL_CAPSULE | Freq: Three times a day (TID) | ORAL | Status: DC
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
## 2014-04-23 NOTE — Patient Instructions (Signed)
TAKE GABAPENTIN (FOR PHANTOM PAIN) THIS WAY: ONE AT NIGHT FOR 4 DAYS THEN---- ONE TWICE DAILY FOR 4 DAYS, THEN--- ONE THREE X DAILY  PLEASE CALL ME WITH ANY PROBLEMS OR QUESTIONS [phone]).

## 2014-04-25 ENCOUNTER — Ambulatory Visit: Payer: Medicaid Other | Admitting: Physical Therapy

## 2014-04-25 DIAGNOSIS — Z4789 Encounter for other orthopedic aftercare: Secondary | ICD-10-CM | POA: Diagnosis not present

## 2014-05-02 ENCOUNTER — Ambulatory Visit: Payer: Medicaid Other | Attending: Orthopedic Surgery | Admitting: Physical Therapy

## 2014-05-02 DIAGNOSIS — S88119A Complete traumatic amputation at level between knee and ankle, unspecified lower leg, initial encounter: Secondary | ICD-10-CM | POA: Diagnosis not present

## 2014-05-02 DIAGNOSIS — R5381 Other malaise: Secondary | ICD-10-CM | POA: Diagnosis not present

## 2014-05-02 DIAGNOSIS — Z4789 Encounter for other orthopedic aftercare: Secondary | ICD-10-CM | POA: Diagnosis not present

## 2014-05-02 DIAGNOSIS — R269 Unspecified abnormalities of gait and mobility: Secondary | ICD-10-CM | POA: Insufficient documentation

## 2014-05-09 ENCOUNTER — Encounter: Payer: Medicaid Other | Admitting: Physical Therapy

## 2014-05-14 ENCOUNTER — Ambulatory Visit: Payer: Medicaid Other | Admitting: Physical Therapy

## 2014-05-14 DIAGNOSIS — Z4789 Encounter for other orthopedic aftercare: Secondary | ICD-10-CM | POA: Diagnosis not present

## 2014-05-16 ENCOUNTER — Encounter: Payer: Medicaid Other | Admitting: Physical Therapy

## 2014-05-21 ENCOUNTER — Encounter: Payer: Medicaid Other | Admitting: Physical Therapy

## 2014-06-24 ENCOUNTER — Encounter: Payer: Medicaid Other | Attending: Physical Medicine & Rehabilitation | Admitting: Physical Medicine & Rehabilitation

## 2014-06-24 ENCOUNTER — Encounter: Payer: Self-pay | Admitting: Physical Medicine & Rehabilitation

## 2014-06-24 VITALS — BP 190/100 | HR 88 | Resp 14 | Ht 74.0 in | Wt 198.2 lb

## 2014-06-24 DIAGNOSIS — I10 Essential (primary) hypertension: Secondary | ICD-10-CM | POA: Insufficient documentation

## 2014-06-24 DIAGNOSIS — L02612 Cutaneous abscess of left foot: Secondary | ICD-10-CM

## 2014-06-24 DIAGNOSIS — Z09 Encounter for follow-up examination after completed treatment for conditions other than malignant neoplasm: Secondary | ICD-10-CM | POA: Diagnosis not present

## 2014-06-24 DIAGNOSIS — Z9081 Acquired absence of spleen: Secondary | ICD-10-CM | POA: Insufficient documentation

## 2014-06-24 DIAGNOSIS — M726 Necrotizing fasciitis: Secondary | ICD-10-CM

## 2014-06-24 DIAGNOSIS — Z87891 Personal history of nicotine dependence: Secondary | ICD-10-CM | POA: Insufficient documentation

## 2014-06-24 DIAGNOSIS — G546 Phantom limb syndrome with pain: Secondary | ICD-10-CM | POA: Diagnosis not present

## 2014-06-24 DIAGNOSIS — E119 Type 2 diabetes mellitus without complications: Secondary | ICD-10-CM | POA: Insufficient documentation

## 2014-06-24 DIAGNOSIS — K5792 Diverticulitis of intestine, part unspecified, without perforation or abscess without bleeding: Secondary | ICD-10-CM | POA: Diagnosis not present

## 2014-06-24 DIAGNOSIS — Z89512 Acquired absence of left leg below knee: Secondary | ICD-10-CM

## 2014-06-24 MED ORDER — LISINOPRIL 20 MG PO TABS: 20.0000 mg | ORAL_TABLET | Freq: Every day | ORAL | Status: DC
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
## 2014-06-24 NOTE — Patient Instructions (Signed)
PLEASE CALL ME WITH ANY PROBLEMS OR QUESTIONS (#[phone]).      

## 2014-08-29 ENCOUNTER — Emergency Department (HOSPITAL_COMMUNITY): Payer: Medicaid Other

## 2014-08-29 ENCOUNTER — Emergency Department (HOSPITAL_COMMUNITY)
Admission: EM | Admit: 2014-08-29 | Discharge: 2014-08-29 | Disposition: A | Discharge status: Home / Self Care | Payer: Medicaid Other | Attending: Emergency Medicine | Admitting: Emergency Medicine

## 2014-08-29 ENCOUNTER — Encounter (HOSPITAL_COMMUNITY): Payer: Self-pay | Admitting: *Deleted

## 2014-08-29 DIAGNOSIS — Z8719 Personal history of other diseases of the digestive system: Secondary | ICD-10-CM | POA: Insufficient documentation

## 2014-08-29 DIAGNOSIS — I10 Essential (primary) hypertension: Secondary | ICD-10-CM | POA: Diagnosis not present

## 2014-08-29 DIAGNOSIS — S86011A Strain of right Achilles tendon, initial encounter: Secondary | ICD-10-CM | POA: Insufficient documentation

## 2014-08-29 DIAGNOSIS — Z79899 Other long term (current) drug therapy: Secondary | ICD-10-CM | POA: Diagnosis not present

## 2014-08-29 DIAGNOSIS — S99911A Unspecified injury of right ankle, initial encounter: Secondary | ICD-10-CM | POA: Diagnosis present

## 2014-08-29 DIAGNOSIS — X58XXXA Exposure to other specified factors, initial encounter: Secondary | ICD-10-CM | POA: Insufficient documentation

## 2014-08-29 DIAGNOSIS — E119 Type 2 diabetes mellitus without complications: Secondary | ICD-10-CM | POA: Diagnosis not present

## 2014-08-29 DIAGNOSIS — W19XXXA Unspecified fall, initial encounter: Secondary | ICD-10-CM

## 2014-08-29 DIAGNOSIS — Z87891 Personal history of nicotine dependence: Secondary | ICD-10-CM | POA: Insufficient documentation

## 2014-08-29 DIAGNOSIS — S96911A Strain of unspecified muscle and tendon at ankle and foot level, right foot, initial encounter: Secondary | ICD-10-CM

## 2014-08-29 DIAGNOSIS — Y9289 Other specified places as the place of occurrence of the external cause: Secondary | ICD-10-CM | POA: Diagnosis not present

## 2014-08-29 DIAGNOSIS — Z794 Long term (current) use of insulin: Secondary | ICD-10-CM | POA: Insufficient documentation

## 2014-08-29 DIAGNOSIS — W1839XA Other fall on same level, initial encounter: Secondary | ICD-10-CM | POA: Insufficient documentation

## 2014-08-29 DIAGNOSIS — Y9389 Activity, other specified: Secondary | ICD-10-CM | POA: Insufficient documentation

## 2014-08-29 DIAGNOSIS — Y998 Other external cause status: Secondary | ICD-10-CM | POA: Insufficient documentation

## 2014-08-29 DIAGNOSIS — Q8901 Asplenia (congenital): Secondary | ICD-10-CM | POA: Insufficient documentation
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
## 2014-08-29 NOTE — Discharge Instructions (Signed)
Recommend a cam walker for ankle joint stability as it is possible you may have ruptured a tendon in your ankle joint. Follow up with your orthopedic surgeon for further evaluation of your symptoms. Use a walker when walking for stability. You may take Tylenol as needed for pain control. Apply ice to the area 3+ times a day.  Tendon Injury Tendons are strong, cordlike structures that connect muscle to bone. Tendons are made up of woven fibers, like a rope. A tendon injury is a tear (rupture) of the tendon. The rupture may be partial (only a few of the fibers in your tendon rupture) or complete (your entire tendon ruptures). CAUSES  Tendon injuries can be caused by high-stress activities, such as sports. They also can be caused by a repetitive injury or by a single injury from an excessive, rapid force. SYMPTOMS  Symptoms of tendon injury include pain when you move the joint close to the tendon. Other symptoms are swelling, redness, and warmth. DIAGNOSIS  Tendon injuries often can be diagnosed by physical exam. However, sometimes an X-ray exam or advanced imaging, such as magnetic resonance imaging (MRI), is necessary to determine the extent of the injury. TREATMENT  Partial tendon ruptures often can be treated with immobilization. A splint, bandage, or removable brace usually is used to immobilize the injured tendon. Most injured tendons need to be immobilized for 1-2 months before they are completely healed. Complete tendon ruptures may require surgical reattachment. Document Released: 09/23/2004 Document Revised: 08/05/2011 Document Reviewed: 11/07/2011 [facility] Patient Information 2015 [place], [place]. This information is not intended to replace advice given to you by your health care provider. Make sure you discuss any questions you have with your health care provider.

## 2014-08-29 NOTE — ED Notes (Signed)
Pt brought in via pov with family. Pt states he was in [place] and tried to hop around the [place]. Pt has a left bka. While pt was hopping around BR he heard a pop and then started having right ankle pain. Pt states that he has some swelling noted to left ankle. Pt states this occurred around 0015 this am.

## 2014-10-16 ENCOUNTER — Encounter (HOSPITAL_COMMUNITY): Payer: Self-pay | Admitting: Emergency Medicine

## 2014-10-16 ENCOUNTER — Emergency Department (HOSPITAL_COMMUNITY): Payer: Medicaid Other

## 2014-10-16 ENCOUNTER — Inpatient Hospital Stay (HOSPITAL_COMMUNITY)
Admission: EM | Admit: 2014-10-16 | Discharge: 2014-10-18 | DRG: 292 | Disposition: A | Discharge status: Home / Self Care | Payer: Medicaid Other | Attending: Internal Medicine | Admitting: Internal Medicine

## 2014-10-16 DIAGNOSIS — R739 Hyperglycemia, unspecified: Secondary | ICD-10-CM

## 2014-10-16 DIAGNOSIS — N179 Acute kidney failure, unspecified: Secondary | ICD-10-CM | POA: Diagnosis present

## 2014-10-16 DIAGNOSIS — E876 Hypokalemia: Secondary | ICD-10-CM

## 2014-10-16 DIAGNOSIS — I10 Essential (primary) hypertension: Secondary | ICD-10-CM | POA: Diagnosis present

## 2014-10-16 DIAGNOSIS — Z89512 Acquired absence of left leg below knee: Secondary | ICD-10-CM

## 2014-10-16 DIAGNOSIS — R778 Other specified abnormalities of plasma proteins: Secondary | ICD-10-CM | POA: Diagnosis present

## 2014-10-16 DIAGNOSIS — I248 Other forms of acute ischemic heart disease: Secondary | ICD-10-CM | POA: Diagnosis present

## 2014-10-16 DIAGNOSIS — D649 Anemia, unspecified: Secondary | ICD-10-CM | POA: Diagnosis present

## 2014-10-16 DIAGNOSIS — Z87891 Personal history of nicotine dependence: Secondary | ICD-10-CM | POA: Diagnosis not present

## 2014-10-16 DIAGNOSIS — E1165 Type 2 diabetes mellitus with hyperglycemia: Secondary | ICD-10-CM | POA: Diagnosis present

## 2014-10-16 DIAGNOSIS — D509 Iron deficiency anemia, unspecified: Secondary | ICD-10-CM | POA: Diagnosis present

## 2014-10-16 DIAGNOSIS — I509 Heart failure, unspecified: Secondary | ICD-10-CM

## 2014-10-16 DIAGNOSIS — I5041 Acute combined systolic (congestive) and diastolic (congestive) heart failure: Principal | ICD-10-CM | POA: Diagnosis present

## 2014-10-16 DIAGNOSIS — E1142 Type 2 diabetes mellitus with diabetic polyneuropathy: Secondary | ICD-10-CM | POA: Diagnosis present

## 2014-10-16 DIAGNOSIS — R0600 Dyspnea, unspecified: Secondary | ICD-10-CM

## 2014-10-16 DIAGNOSIS — E1159 Type 2 diabetes mellitus with other circulatory complications: Secondary | ICD-10-CM | POA: Diagnosis present

## 2014-10-16 DIAGNOSIS — I5032 Chronic diastolic (congestive) heart failure: Secondary | ICD-10-CM | POA: Diagnosis present

## 2014-10-16 DIAGNOSIS — Z833 Family history of diabetes mellitus: Secondary | ICD-10-CM

## 2014-10-16 DIAGNOSIS — Z794 Long term (current) use of insulin: Secondary | ICD-10-CM

## 2014-10-16 DIAGNOSIS — Z79899 Other long term (current) drug therapy: Secondary | ICD-10-CM | POA: Diagnosis not present

## 2014-10-16 DIAGNOSIS — I5043 Acute on chronic combined systolic (congestive) and diastolic (congestive) heart failure: Secondary | ICD-10-CM

## 2014-10-16 DIAGNOSIS — R7989 Other specified abnormal findings of blood chemistry: Secondary | ICD-10-CM

## 2014-10-16 DIAGNOSIS — R0602 Shortness of breath: Secondary | ICD-10-CM | POA: Diagnosis not present

## 2014-10-16 DIAGNOSIS — D638 Anemia in other chronic diseases classified elsewhere: Secondary | ICD-10-CM | POA: Diagnosis present

## 2014-10-16 DIAGNOSIS — Z89519 Acquired absence of unspecified leg below knee: Secondary | ICD-10-CM

## 2014-10-16 HISTORY — DX: Other specified abnormalities of plasma proteins: R77.8

## 2014-10-16 HISTORY — DX: Other specified abnormal findings of blood chemistry: R79.89

## 2014-10-16 LAB — BASIC METABOLIC PANEL
Anion gap: 9 (ref 5–15)
BUN: 12 mg/dL (ref 6–23)
CO2: 28 mmol/L (ref 19–32)
Calcium: 8.8 mg/dL (ref 8.4–10.5)
Chloride: 108 mmol/L (ref 96–112)
Creatinine, Ser: 1.31 mg/dL (ref 0.50–1.35)
GFR calc Af Amer: 65 mL/min — ABNORMAL LOW (ref >90)
GFR calc non Af Amer: 56 mL/min — ABNORMAL LOW (ref >90)
Glucose, Bld: 188 mg/dL — ABNORMAL HIGH (ref 70–99)
Potassium: 3.1 mmol/L — ABNORMAL LOW (ref 3.5–5.1)
Sodium: 145 mmol/L (ref 135–145)

## 2014-10-16 LAB — CBC
HCT: 31.5 % — ABNORMAL LOW (ref 39.0–52.0)
Hemoglobin: 10.2 g/dL — ABNORMAL LOW (ref 13.0–17.0)
MCH: 29.5 pg (ref 26.0–34.0)
MCHC: 32.4 g/dL (ref 30.0–36.0)
MCV: 91 fL (ref 78.0–100.0)
Platelets: 462 10*3/uL — ABNORMAL HIGH (ref 150–400)
RBC: 3.46 MIL/uL — ABNORMAL LOW (ref 4.22–5.81)
RDW: 14.5 % (ref 11.5–15.5)
WBC: 7.2 10*3/uL (ref 4.0–10.5)

## 2014-10-16 LAB — TROPONIN I
Troponin I: 0.04 ng/mL — ABNORMAL HIGH (ref <0.031)
Troponin I: 0.03 ng/mL (ref <0.031)

## 2014-10-16 LAB — URINALYSIS, ROUTINE W REFLEX MICROSCOPIC
Bilirubin Urine: NEGATIVE
Glucose, UA: NEGATIVE mg/dL
Ketones, ur: NEGATIVE mg/dL
Leukocytes, UA: NEGATIVE
Nitrite: NEGATIVE
Protein, ur: 100 mg/dL — AB
Specific Gravity, Urine: 1.01 (ref 1.005–1.030)
Urobilinogen, UA: 0.2 mg/dL (ref 0.0–1.0)
pH: 6.5 (ref 5.0–8.0)

## 2014-10-16 LAB — BRAIN NATRIURETIC PEPTIDE: B Natriuretic Peptide: 1260.2 pg/mL — ABNORMAL HIGH (ref 0.0–100.0)

## 2014-10-16 LAB — GLUCOSE, CAPILLARY: Glucose-Capillary: 117 mg/dL — ABNORMAL HIGH (ref 70–99)

## 2014-10-16 LAB — URINE MICROSCOPIC-ADD ON

## 2014-10-16 LAB — D-DIMER, QUANTITATIVE: D-Dimer, Quant: 1.51 ug/mL-FEU — ABNORMAL HIGH (ref 0.00–0.48)

## 2014-10-16 MED ORDER — SODIUM CHLORIDE 0.9 % IV SOLN: 250.0000 mL | INTRAVENOUS | Status: DC | PRN

## 2014-10-16 MED ORDER — ENOXAPARIN SODIUM 40 MG/0.4ML ~~LOC~~ SOLN
40.0000 mg | Freq: Every day | SUBCUTANEOUS | Status: DC
  Administered 2014-10-16 – 2014-10-17 (×2): 40 mg via SUBCUTANEOUS
  Filled 2014-10-16 (×2): qty 0.4

## 2014-10-16 MED ORDER — ASPIRIN EC 81 MG PO TBEC
81.0000 mg | DELAYED_RELEASE_TABLET | Freq: Every day | ORAL | Status: DC
  Administered 2014-10-17 – 2014-10-18 (×2): 81 mg via ORAL
  Filled 2014-10-16 (×2): qty 1

## 2014-10-16 MED ORDER — SODIUM CHLORIDE 0.9 % IJ SOLN: 3.0000 mL | INTRAMUSCULAR | Status: DC | PRN

## 2014-10-16 MED ORDER — HYDROCODONE-ACETAMINOPHEN 5-325 MG PO TABS
1.0000 | ORAL_TABLET | Freq: Two times a day (BID) | ORAL | Status: DC | PRN
  Administered 2014-10-17 (×2): 1 via ORAL
  Filled 2014-10-16 (×2): qty 1

## 2014-10-16 MED ORDER — INFLUENZA VAC SPLIT QUAD 0.5 ML IM SUSY
0.5000 mL | PREFILLED_SYRINGE | INTRAMUSCULAR | Status: AC
  Administered 2014-10-17: 0.5 mL via INTRAMUSCULAR
  Filled 2014-10-16 (×2): qty 0.5

## 2014-10-16 MED ORDER — POTASSIUM CHLORIDE CRYS ER 20 MEQ PO TBCR
40.0000 meq | EXTENDED_RELEASE_TABLET | Freq: Once | ORAL | Status: AC
  Administered 2014-10-16: 40 meq via ORAL
  Filled 2014-10-16: qty 2

## 2014-10-16 MED ORDER — ONDANSETRON HCL 4 MG/2ML IJ SOLN: 4.0000 mg | Freq: Four times a day (QID) | INTRAMUSCULAR | Status: DC | PRN

## 2014-10-16 MED ORDER — IOHEXOL 350 MG/ML SOLN
100.0000 mL | Freq: Once | INTRAVENOUS | Status: AC | PRN
  Administered 2014-10-16: 100 mL via INTRAVENOUS

## 2014-10-16 MED ORDER — LISINOPRIL 20 MG PO TABS
20.0000 mg | ORAL_TABLET | Freq: Every day | ORAL | Status: DC
  Administered 2014-10-16 – 2014-10-18 (×3): 20 mg via ORAL
  Filled 2014-10-16 (×3): qty 1

## 2014-10-16 MED ORDER — SODIUM CHLORIDE 0.9 % IJ SOLN
3.0000 mL | Freq: Two times a day (BID) | INTRAMUSCULAR | Status: DC
  Administered 2014-10-16 – 2014-10-17 (×3): 3 mL via INTRAVENOUS

## 2014-10-16 MED ORDER — POTASSIUM CHLORIDE 10 MEQ/100ML IV SOLN
10.0000 meq | INTRAVENOUS | Status: AC
  Administered 2014-10-16 (×2): 10 meq via INTRAVENOUS
  Filled 2014-10-16 (×2): qty 100

## 2014-10-16 MED ORDER — INSULIN GLARGINE 100 UNIT/ML ~~LOC~~ SOLN
10.0000 [IU] | Freq: Every day | SUBCUTANEOUS | Status: DC
  Administered 2014-10-16 – 2014-10-17 (×2): 10 [IU] via SUBCUTANEOUS
  Filled 2014-10-16 (×2): qty 0.1

## 2014-10-16 MED ORDER — FUROSEMIDE 10 MG/ML IJ SOLN
40.0000 mg | Freq: Two times a day (BID) | INTRAMUSCULAR | Status: DC
  Administered 2014-10-17 – 2014-10-18 (×3): 40 mg via INTRAVENOUS
  Filled 2014-10-16 (×3): qty 4

## 2014-10-16 MED ORDER — ALBUTEROL SULFATE (2.5 MG/3ML) 0.083% IN NEBU
5.0000 mg | INHALATION_SOLUTION | Freq: Once | RESPIRATORY_TRACT | Status: AC
  Administered 2014-10-16: 5 mg via RESPIRATORY_TRACT
  Filled 2014-10-16: qty 6

## 2014-10-16 MED ORDER — POTASSIUM CHLORIDE 10 MEQ/100ML IV SOLN
10.0000 meq | Freq: Once | INTRAVENOUS | Status: AC
  Administered 2014-10-16: 10 meq via INTRAVENOUS
  Filled 2014-10-16: qty 100

## 2014-10-16 MED ORDER — ACETAMINOPHEN 325 MG PO TABS: 650.0000 mg | ORAL_TABLET | ORAL | Status: DC | PRN

## 2014-10-16 MED ORDER — FUROSEMIDE 10 MG/ML IJ SOLN
40.0000 mg | Freq: Once | INTRAMUSCULAR | Status: AC
  Administered 2014-10-16: 40 mg via INTRAVENOUS
  Filled 2014-10-16: qty 4

## 2014-10-16 MED ORDER — CARVEDILOL 3.125 MG PO TABS
3.1250 mg | ORAL_TABLET | Freq: Two times a day (BID) | ORAL | Status: DC
  Administered 2014-10-17 – 2014-10-18 (×3): 3.125 mg via ORAL
  Filled 2014-10-16 (×3): qty 1

## 2014-10-16 NOTE — ED Notes (Signed)
Pt c/o SHOB x 3 days. Pt states that he believes it if from taking Vicodin, for leg pain.  Pt has dry cough about same time frame as SHOB.
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
## 2014-10-16 NOTE — ED Notes (Signed)
Patient transported to CT 

## 2014-10-16 NOTE — ED Notes (Signed)
Pt returned from ct

## 2014-10-17 DIAGNOSIS — I509 Heart failure, unspecified: Secondary | ICD-10-CM

## 2014-10-17 DIAGNOSIS — E1151 Type 2 diabetes mellitus with diabetic peripheral angiopathy without gangrene: Secondary | ICD-10-CM

## 2014-10-17 DIAGNOSIS — E876 Hypokalemia: Secondary | ICD-10-CM

## 2014-10-17 DIAGNOSIS — R7989 Other specified abnormal findings of blood chemistry: Secondary | ICD-10-CM

## 2014-10-17 LAB — COMPREHENSIVE METABOLIC PANEL
ALT: 12 U/L (ref 0–53)
AST: 16 U/L (ref 0–37)
Albumin: 2.5 g/dL — ABNORMAL LOW (ref 3.5–5.2)
Alkaline Phosphatase: 66 U/L (ref 39–117)
Anion gap: 5 (ref 5–15)
BUN: 15 mg/dL (ref 6–23)
CO2: 30 mmol/L (ref 19–32)
Calcium: 8.3 mg/dL — ABNORMAL LOW (ref 8.4–10.5)
Chloride: 108 mmol/L (ref 96–112)
Creatinine, Ser: 1.46 mg/dL — ABNORMAL HIGH (ref 0.50–1.35)
GFR calc Af Amer: 57 mL/min — ABNORMAL LOW (ref >90)
GFR calc non Af Amer: 49 mL/min — ABNORMAL LOW (ref >90)
Glucose, Bld: 143 mg/dL — ABNORMAL HIGH (ref 70–99)
Potassium: 3.1 mmol/L — ABNORMAL LOW (ref 3.5–5.1)
Sodium: 143 mmol/L (ref 135–145)
Total Bilirubin: 0.4 mg/dL (ref 0.3–1.2)
Total Protein: 6.3 g/dL (ref 6.0–8.3)

## 2014-10-17 LAB — CBC WITH DIFFERENTIAL/PLATELET
Basophils Absolute: 0.1 10*3/uL (ref 0.0–0.1)
Basophils Relative: 1 % (ref 0–1)
Eosinophils Absolute: 0.4 10*3/uL (ref 0.0–0.7)
Eosinophils Relative: 5 % (ref 0–5)
HCT: 27.1 % — ABNORMAL LOW (ref 39.0–52.0)
Hemoglobin: 8.8 g/dL — ABNORMAL LOW (ref 13.0–17.0)
Lymphocytes Relative: 39 % (ref 12–46)
Lymphs Abs: 2.7 10*3/uL (ref 0.7–4.0)
MCH: 29.6 pg (ref 26.0–34.0)
MCHC: 32.5 g/dL (ref 30.0–36.0)
MCV: 91.2 fL (ref 78.0–100.0)
Monocytes Absolute: 0.7 10*3/uL (ref 0.1–1.0)
Monocytes Relative: 10 % (ref 3–12)
Neutro Abs: 3.1 10*3/uL (ref 1.7–7.7)
Neutrophils Relative %: 45 % (ref 43–77)
Platelets: 425 10*3/uL — ABNORMAL HIGH (ref 150–400)
RBC: 2.97 MIL/uL — ABNORMAL LOW (ref 4.22–5.81)
RDW: 14.4 % (ref 11.5–15.5)
WBC: 6.8 10*3/uL (ref 4.0–10.5)

## 2014-10-17 LAB — FERRITIN: Ferritin: 202 ng/mL (ref 22–322)

## 2014-10-17 LAB — BRAIN NATRIURETIC PEPTIDE: B Natriuretic Peptide: 1175.6 pg/mL — ABNORMAL HIGH (ref 0.0–100.0)

## 2014-10-17 LAB — GLUCOSE, CAPILLARY
Glucose-Capillary: 130 mg/dL — ABNORMAL HIGH (ref 70–99)
Glucose-Capillary: 142 mg/dL — ABNORMAL HIGH (ref 70–99)
Glucose-Capillary: 146 mg/dL — ABNORMAL HIGH (ref 70–99)
Glucose-Capillary: 156 mg/dL — ABNORMAL HIGH (ref 70–99)

## 2014-10-17 LAB — IRON AND TIBC
Iron: 24 ug/dL — ABNORMAL LOW (ref 42–165)
Saturation Ratios: 15 % — ABNORMAL LOW (ref 20–55)
TIBC: 162 ug/dL — ABNORMAL LOW (ref 215–435)
UIBC: 138 ug/dL (ref 125–400)

## 2014-10-17 LAB — TROPONIN I: Troponin I: 0.04 ng/mL — ABNORMAL HIGH (ref <0.031)

## 2014-10-17 LAB — RETICULOCYTES
RBC.: 2.97 MIL/uL — ABNORMAL LOW (ref 4.22–5.81)
Retic Count, Absolute: 62.4 10*3/uL (ref 19.0–186.0)
Retic Ct Pct: 2.1 % (ref 0.4–3.1)

## 2014-10-17 LAB — VITAMIN B12: Vitamin B-12: 345 pg/mL (ref 211–911)

## 2014-10-17 LAB — FOLATE: Folate: 7.2 ng/mL

## 2014-10-17 LAB — TSH: TSH: 0.968 u[IU]/mL (ref 0.350–4.500)

## 2014-10-17 MED ORDER — POTASSIUM CHLORIDE CRYS ER 20 MEQ PO TBCR
40.0000 meq | EXTENDED_RELEASE_TABLET | ORAL | Status: AC
  Administered 2014-10-17 (×2): 40 meq via ORAL
  Filled 2014-10-17 (×2): qty 2

## 2014-10-17 MED ORDER — FERROUS GLUCONATE 324 (38 FE) MG PO TABS
324.0000 mg | ORAL_TABLET | Freq: Two times a day (BID) | ORAL | Status: DC
  Administered 2014-10-17 – 2014-10-18 (×2): 324 mg via ORAL
  Filled 2014-10-17 (×2): qty 1

## 2014-10-17 MED ORDER — INSULIN ASPART 100 UNIT/ML ~~LOC~~ SOLN
0.0000 [IU] | Freq: Three times a day (TID) | SUBCUTANEOUS | Status: DC
  Administered 2014-10-17: 2 [IU] via SUBCUTANEOUS
  Administered 2014-10-18: 3 [IU] via SUBCUTANEOUS

## 2014-10-17 MED ORDER — GABAPENTIN 300 MG PO CAPS
300.0000 mg | ORAL_CAPSULE | Freq: Three times a day (TID) | ORAL | Status: DC
  Administered 2014-10-17 – 2014-10-18 (×3): 300 mg via ORAL
  Filled 2014-10-17 (×3): qty 1

## 2014-10-17 MED ORDER — INSULIN ASPART 100 UNIT/ML ~~LOC~~ SOLN: 0.0000 [IU] | Freq: Every day | SUBCUTANEOUS | Status: DC

## 2014-10-17 NOTE — Clinical Documentation Improvement (Signed)
Please clarify, if indicated, the type and acuity of CHF.  Marland Kitchen Document acuity --Acute --Chronic --Acute on Chronic  . Document type --Diastolic --Systolic --Combined systolic and diastolic  . Due to or associated with --Hypertension --Valvular disease --Rheumatic heart disease Endocarditis (valvitis) Pericarditis Myocarditis --Other (specify)  Supporting Information: HTN, BNP 1260 ED notes:"CT w no PE, however c/w chf.  Lasix iv. H&P:"CTA done that showed evidence of CHF and pleural effusions.  Hospitalist was called for admission for CHF exacerbation shortness of breath at rest. dyspnea on exertion. Lower extremities: 2+edema of right leg, CHF exacerbation- admit on telemetry, cycle cardiac enzymes, obtain serial ECG, to evaluate for ischemia as a cause of heart failure monitor daily weight diurese with IV lasix and monitor orthostatics and creatinine to avoid over diuresis. Order 2 D echogram to evaluate EF-pending report"  CT Angio Chest : IMPRESSION: 1. No pulmonary emboli. 2. Cardiomegaly with bilateral large pleural effusions and patchy alveolar pulmonary edema consistent with congestive heart failure.  Thank You,  Melvia Heaps, RN, BSN, CDI [phone] [email or web address]

## 2014-10-17 NOTE — Care Management Note (Addendum)
    Page 1 of 1   10/18/2014     2:29:06 PM CARE MANAGEMENT NOTE 10/18/2014  Patient:  Mark [name], Mark [name]   Account Number:  [id number]  Date Initiated:  10/17/2014  Documentation initiated by:  Dessa Phi  Subjective/Objective Assessment:   64 y/o m admitted w/CHF, DM.     Action/Plan:   From home.   Anticipated DC Date:  10/18/2014   Anticipated DC Plan:  [place]  CM consult      Choice offered to / List presented to:  [name]        [place] arranged  HH-1 RN      [place].   Status of service:  Completed, signed off Medicare Important Message given?   (If response is "NO", the following Medicare IM given date fields will be blank) Date Medicare IM given:   Medicare IM given by:   Date Additional Medicare IM given:   Additional Medicare IM given by:    Discharge Disposition:    Per UR Regulation:  Reviewed for med. necessity/level of care/duration of stay  If discussed at [place] of Stay Meetings, dates discussed:    Comments:  10/18/14 Dessa Phi RN BSN NCM [phone] [facility] chosen for HHRN-CHF protocal.Kristen rep aware of d/c & hhc orders.PCCN also following for community resources.No further d/c needs or orders.  10/17/14 Dessa Phi RN BSN NCM [id number] No anticipated d/c needs.

## 2014-10-17 NOTE — Progress Notes (Addendum)
[facility] PROGRESS NOTE  BABAJIDE RUFENACHT [id number] DOB: February 16, 1951 DOA: 10/16/2014 PCP: Lorayne Marek, MD   brief narrative 64 year old male with uncontrolled diabetes mellitus, hospitalized in February 2015 with sepsis secondary to necrotizing fasciitis, nonhealing ulcer of the foot with necrosis requiring left BKA ,hypertension, who presented with worsening dyspnea on exertion for one and half weeks associated with right leg swelling. In the ED he was found to have elevated d-dimer and given underlying shortness of breath a CT angiogram of chest was done which was negative for PE but showed findings of CHF. Patient admitted for further management.    Assessment/Plan: Acute CHF exacerbation Risk factors include hypertension and uncontrolled diabetes. Diuresis with IV Lasix. Check 2-D echo. Monitor on telemetry. Symptoms much improved clinically and patient feels close to his baseline. -Monitor strict I/O and daily weight. Added aspirin and BB . Continue lisinopril.  Uncontrolled type 2 diabetes mellitus with peripheral neuropathy Continue home dose Lantus and sliding scale insulin. Hold metformin. Check A1c. Continue neurontin.  Iron Deficiency anemia.  Continue iron supplements.  Hypokalemia Replenish potassium  Elevated troponin Likely secondary to demand ischemia. Patient denies chest pain with normal EKG.  DVT prophylaxis: Lovenox  Diet: Heart healthy/diabetic   Code Status: Full code Family Communication: None at bedside Disposition Plan: Home possibly tomorrow   Consultants:  None  Procedures:  2-D echo pending  Antibiotics:  None  HPI/Subjective: Patient seen and examined. He reports feeling much better with IV Lasix.  Objective: Filed Vitals:   10/17/14 1351  BP: 148/94  Pulse: 80  Temp: 98 F (36.7 C)  Resp: 18    Intake/Output Summary (Last 24 hours) at 10/17/14 1433 Last data filed at 10/17/14 1300  Gross per 24 hour   Intake   1080 ml  Output    625 ml  Net    455 ml   Filed Weights   10/16/14 2202 10/17/14 0422  Weight: 94.9 kg (209 lb 3.5 oz) 94.7 kg (208 lb 12.4 oz)    Exam:   General: Elderly male in no acute distress  HEENT: No pallor, moist oral mucosa, no JVD, neck supple  Cardiovascular: Normal S1 and S2, no murmurs rub or gallop  Respiratory: Diminished bibasilar breath sounds, no wheezing rhonchi or crackles  Abdomen: Soft, nondistended, nontender, bowel sounds present  Musculoskeletal: Warm, left BKA, trace right leg edema  CNS: Alert and oriented  Data Reviewed: Basic Metabolic Panel:  Recent Labs Lab 10/16/14 1446 10/17/14 0405  NA 145 143  K 3.1* 3.1*  CL 108 108  CO2 28 30  GLUCOSE 188* 143*  BUN 12 15  CREATININE 1.31 1.46*  CALCIUM 8.8 8.3*   Liver Function Tests:  Recent Labs Lab 10/17/14 0405  AST 16  ALT 12  ALKPHOS 66  BILITOT 0.4  PROT 6.3  ALBUMIN 2.5*   No results for input(s): LIPASE, AMYLASE in the last 168 hours. No results for input(s): AMMONIA in the last 168 hours. CBC:  Recent Labs Lab 10/16/14 1446 10/17/14 0405  WBC 7.2 6.8  NEUTROABS  --  3.1  HGB 10.2* 8.8*  HCT 31.5* 27.1*  MCV 91.0 91.2  PLT 462* 425*   Cardiac Enzymes:  Recent Labs Lab 10/16/14 1629 10/16/14 2156 10/17/14 0405 10/17/14 1008  TROPONINI 0.04* 0.03 0.04* TEST CANCELLED PER RN   BNP (last 3 results)  Recent Labs  10/16/14 1630 10/17/14 0405  BNP 1260.2* 1175.6*    ProBNP (last 3 results) No results for input(s): PROBNP  in the last 8760 hours.  CBG:  Recent Labs Lab 10/16/14 2207 10/17/14 0746 10/17/14 1155  GLUCAP 117* 130* 142*    No results found for this or any previous visit (from the past 240 hour(s)).   Studies: Dg Chest 2 View (if Patient Has Fever And/or Copd)  10/16/2014   CLINICAL DATA:  Shortness of breath and cough for 1 month  EXAM: CHEST  2 VIEW  COMPARISON:  11/01/2013  FINDINGS: Cardiac shadow is at the upper  limits of normal and enlarged from the prior exam. Bibasilar infiltrates with associated effusions are seen. Mild vascular congestion is also noted. No bony abnormality is seen.  IMPRESSION: Bibasilar infiltrates an associated effusions. Mild vascular congestion is present.   Electronically Signed   By: Inez Catalina M.D.   On: 10/16/2014 15:44   Ct Angio Chest Pe W/cm &/or Wo Cm  10/16/2014   CLINICAL DATA:  Progressive shortness of breath with productive cough.  EXAM: CT ANGIOGRAPHY CHEST WITH CONTRAST  TECHNIQUE: Multidetector CT imaging of the chest was performed using the standard protocol during bolus administration of intravenous contrast. Multiplanar CT image reconstructions and MIPs were obtained to evaluate the vascular anatomy.  CONTRAST:  143mL OMNIPAQUE IOHEXOL 350 MG/ML SOLN  COMPARISON:  Chest x-rays dated 10/16/2014 and 11/01/2013  FINDINGS: There are no pulmonary emboli. There are large bilateral pleural effusions with bilateral alveolar pulmonary edema. There is cardiomegaly. RV/LV ratio is normal. There is compressive atelectasis of both lungs due to the pleural effusions.  No osseous abnormality. No coronary artery calcification. Visualized portion of the upper abdomen is normal except for 13 mm and 17 mm cysts on the upper pole of the left kidney.  Review of the MIP images confirms the above findings.  IMPRESSION: 1. No pulmonary emboli. 2. Cardiomegaly with bilateral large pleural effusions and patchy alveolar pulmonary edema consistent with congestive heart failure.   Electronically Signed   By: Lorriane Shire M.D.   On: 10/16/2014 19:22    Scheduled Meds: . aspirin EC  81 mg Oral Daily  . carvedilol  3.125 mg Oral BID WC  . enoxaparin (LOVENOX) injection  40 mg Subcutaneous QHS  . furosemide  40 mg Intravenous BID  . insulin aspart  0-15 Units Subcutaneous TID WC  . insulin aspart  0-5 Units Subcutaneous QHS  . insulin glargine  10 Units Subcutaneous QHS  . lisinopril  20 mg Oral  Daily  . potassium chloride  40 mEq Oral Q4H  . sodium chloride  3 mL Intravenous Q12H   Continuous Infusions:      Time spent: 25 minutes    Tonesha Tsou, [place]  [facility] Pager [phone] If 7PM-7AM, please contact night-coverage at [email or web address], password [email or web address] 10/17/2014, 2:33 PM  LOS: 1 day

## 2014-10-17 NOTE — Progress Notes (Signed)
  Echocardiogram 2D Echocardiogram has been performed.  Odies Desa FRANCES 10/17/2014, 3:19 PM

## 2014-10-18 DIAGNOSIS — I5041 Acute combined systolic (congestive) and diastolic (congestive) heart failure: Principal | ICD-10-CM

## 2014-10-18 DIAGNOSIS — N179 Acute kidney failure, unspecified: Secondary | ICD-10-CM | POA: Diagnosis present

## 2014-10-18 DIAGNOSIS — I5032 Chronic diastolic (congestive) heart failure: Secondary | ICD-10-CM | POA: Diagnosis present

## 2014-10-18 LAB — BASIC METABOLIC PANEL
Anion gap: 6 (ref 5–15)
BUN: 18 mg/dL (ref 6–23)
CO2: 29 mmol/L (ref 19–32)
Calcium: 8.6 mg/dL (ref 8.4–10.5)
Chloride: 108 mmol/L (ref 96–112)
Creatinine, Ser: 1.47 mg/dL — ABNORMAL HIGH (ref 0.50–1.35)
GFR calc Af Amer: 57 mL/min — ABNORMAL LOW (ref >90)
GFR calc non Af Amer: 49 mL/min — ABNORMAL LOW (ref >90)
Glucose, Bld: 127 mg/dL — ABNORMAL HIGH (ref 70–99)
Potassium: 3.5 mmol/L (ref 3.5–5.1)
Sodium: 143 mmol/L (ref 135–145)

## 2014-10-18 LAB — CLOSTRIDIUM DIFFICILE BY PCR: Toxigenic C. Difficile by PCR: NEGATIVE

## 2014-10-18 LAB — GLUCOSE, CAPILLARY
Glucose-Capillary: 101 mg/dL — ABNORMAL HIGH (ref 70–99)
Glucose-Capillary: 191 mg/dL — ABNORMAL HIGH (ref 70–99)

## 2014-10-18 LAB — HIV ANTIBODY (ROUTINE TESTING W REFLEX): HIV Screen 4th Generation wRfx: NONREACTIVE

## 2014-10-18 MED ORDER — CARVEDILOL 3.125 MG PO TABS: 3.1250 mg | ORAL_TABLET | Freq: Two times a day (BID) | ORAL | Status: DC

## 2014-10-18 MED ORDER — FUROSEMIDE 40 MG PO TABS: 40.0000 mg | ORAL_TABLET | Freq: Every day | ORAL | Status: DC

## 2014-10-18 MED ORDER — ASPIRIN 81 MG PO TBEC: 81.0000 mg | DELAYED_RELEASE_TABLET | Freq: Every day | ORAL | Status: DC

## 2014-10-18 NOTE — Discharge Summary (Signed)
Physician Discharge Summary  Mark [name] JGG:[id number] DOB: [birth year] DOA: 10/16/2014  PCP: Lorayne Marek, MD  Admit date: 10/16/2014 Discharge date: 10/18/2014  Time spent: 35 minutes  Recommendations for Outpatient Follow-up:  1. Home with HHRN 2. Follow up with PCP early next week. Please check potassium and renal function. Resume metformin if renal function normal. 3. Referral to cardiology as outpatient. Office will call for appointment.  Discharge Diagnoses:  Principal Problem:   Acute combined systolic and diastolic congestive heart failure  Active Problems:   DM type 2 causing vascular disease   Hypokalemia   Anemia   S/P BKA (below knee amputation)   Essential hypertension   Elevated troponin   Acute kidney injury   Discharge Condition: Fair  Diet recommendation: Heart healthy/diabetic  Filed Weights   10/17/14 0422 10/18/14 0440 10/18/14 0814  Weight: 94.7 kg (208 lb 12.4 oz) 94 kg (207 lb 3.7 oz) 94.7 kg (208 lb 12.4 oz)    History of present illness:  64 year old male with diabetes mellitus, hospitalized in February 2015 with sepsis secondary to necrotizing fasciitis, nonhealing ulcer of the foot with necrosis requiring left BKA ,hypertension, who presented with worsening dyspnea on exertion for one and half weeks associated with right leg swelling. In the ED he was found to have elevated d-dimer and given underlying shortness of breath a CT angiogram of chest was done which was negative for PE but showed findings of CHF. Patient admitted for further management.  Hospital Course:  Acute mild systolic and diastolic CHF Risk factors include hypertension and diabetes. Patient admitted to telemetry and diuresed with IV Lasix 40 mg twice daily. Symptoms much improved with diuresis. 2-D echo done showed EF of 45-50% with diffuse kinesis and grade 2 diastolic dysfunction. Patient reports having a weighing scale at home and have instructed to monitor his weight  daily and call his PCP if noticed more than 3 pound weight gain in one day and more than 5 pound in 1 week with symptoms. -Instructed on diet monitoring and medication adherence. -have asked [facility] to arrange outpt appt. -We'll discharge patient on  oral Lasix 40 mg once daily.  Added baby aspirin and carvedilol . Continue lisinopril.   type 2 diabetes mellitus with peripheral neuropathy Resume home dose Lantus and Amaryl. Hold metformin given acute kidney injury. Follow-up A1c and renal function as outpatient and if stable can resume metformin. Continue neurontin.  Acute kidney injury Mild.  possibly worsened by IV Lasix. Will hold metformin until outpatient follow-up.  Iron Deficiency anemia.  Continue iron supplements.  Hypokalemia Replenished  Elevated troponin Likely secondary to demand ischemia. Patient denies chest pain and has normal EKG. subsequent troponin flattened out. However given new-onset CHF and feels hypokinesis seen on 2-D echo he needs cardiac workup as outpatient. I have called [facility] office who will arrange outpatient appointment for him.    Diet: Heart healthy/diabetic   Code Status: Full code Family Communication: None at bedside Disposition Plan: Home    Consultants:  None  Procedures:  2-D echo   Antibiotics:  None  Discharge Exam: Filed Vitals:   10/18/14 0956  BP: 133/80  Pulse: 81  Temp: 98.2 F (36.8 C)  Resp:      General: Elderly male in no acute distress  HEENT: No pallor, moist oral mucosa, no JVD, neck supple  Cardiovascular: Normal S1 and S2, no murmurs rub or gallop  Respiratory: improved bibasilar breath sounds, no wheezing rhonchi or crackles  Abdomen: Soft, nondistended, nontender, bowel sounds present  Musculoskeletal: Warm, left BKA, trace right leg edema  CNS: Alert and oriented  Discharge Instructions    Current Discharge Medication List    START taking these medications   Details   aspirin EC 81 MG EC tablet Take 1 tablet (81 mg total) by mouth daily. Qty: 30 tablet, Refills: 0    carvedilol (COREG) 3.125 MG tablet Take 1 tablet (3.125 mg total) by mouth 2 (two) times daily with a meal. Qty: 60 tablet, Refills: 0    furosemide (LASIX) 40 MG tablet Take 1 tablet (40 mg total) by mouth daily. Qty: 30 tablet, Refills: 0      CONTINUE these medications which have NOT CHANGED   Details  Blood Glucose Monitoring Suppl (ACCU-CHEK AVIVA PLUS) W/DEVICE KIT Use as prescribed TID before meals and QHS Qty: 1 kit, Refills: 0    glucose blood (ACCU-CHEK AVIVA) test strip Use as instructed Qty: 100 each, Refills: 12    HYDROcodone-acetaminophen (NORCO/VICODIN) 5-325 MG per tablet Take 1 tablet by mouth every 12 (twelve) hours as needed for moderate pain.    Insulin Glargine (LANTUS SOLOSTAR) 100 UNIT/ML Solostar Pen Inject 10 Units into the skin daily at 10 pm. Qty: 10 pen, Refills: 3   Associated Diagnoses: Type II or unspecified type diabetes mellitus with unspecified complication, uncontrolled    Lancets (ACCU-CHEK SOFT TOUCH) lancets Use as instructed Qty: 100 each, Refills: 12    lisinopril (PRINIVIL,ZESTRIL) 20 MG tablet Take 1 tablet (20 mg total) by mouth daily. Qty: 30 tablet, Refills: 3    ferrous gluconate (FERGON) 324 MG tablet Take 1 tablet (324 mg total) by mouth 2 (two) times daily with a meal. Qty: 60 tablet, Refills: 1    gabapentin (NEURONTIN) 300 MG capsule Take 1 capsule (300 mg total) by mouth 3 (three) times daily. Qty: 90 capsule, Refills: 3   Associated Diagnoses: Status post below knee amputation of left lower extremity; Phantom limb pain      STOP taking these medications     metFORMIN (GLUCOPHAGE) 500 MG tablet        No Known Allergies Follow-up Information    Follow up with Lorayne Marek, MD On 10/21/2014.   Specialty:  Internal Medicine   Contact information:   [address] [place] [id number] [phone]         The results of significant diagnostics from this hospitalization (including imaging, microbiology, ancillary and laboratory) are listed below for reference.    Significant Diagnostic Studies: Dg Chest 2 View (if Patient Has Fever And/or Copd)  10/16/2014   CLINICAL DATA:  Shortness of breath and cough for 1 month  EXAM: CHEST  2 VIEW  COMPARISON:  11/01/2013  FINDINGS: Cardiac shadow is at the upper limits of normal and enlarged from the prior exam. Bibasilar infiltrates with associated effusions are seen. Mild vascular congestion is also noted. No bony abnormality is seen.  IMPRESSION: Bibasilar infiltrates an associated effusions. Mild vascular congestion is present.   Electronically Signed   By: Inez Catalina M.D.   On: 10/16/2014 15:44   Ct Angio Chest Pe W/cm &/or Wo Cm  10/16/2014   CLINICAL DATA:  Progressive shortness of breath with productive cough.  EXAM: CT ANGIOGRAPHY CHEST WITH CONTRAST  TECHNIQUE: Multidetector CT imaging of the chest was performed using the standard protocol during bolus administration of intravenous contrast. Multiplanar CT image reconstructions and MIPs were obtained to evaluate the vascular anatomy.  CONTRAST:  173m  OMNIPAQUE IOHEXOL 350 MG/ML SOLN  COMPARISON:  Chest x-rays dated 10/16/2014 and 11/01/2013  FINDINGS: There are no pulmonary emboli. There are large bilateral pleural effusions with bilateral alveolar pulmonary edema. There is cardiomegaly. RV/LV ratio is normal. There is compressive atelectasis of both lungs due to the pleural effusions.  No osseous abnormality. No coronary artery calcification. Visualized portion of the upper abdomen is normal except for 13 mm and 17 mm cysts on the upper pole of the left kidney.  Review of the MIP images confirms the above findings.  IMPRESSION: 1. No pulmonary emboli. 2. Cardiomegaly with bilateral large pleural effusions and patchy alveolar pulmonary edema consistent with congestive heart failure.   Electronically  Signed   By: Lorriane Shire M.D.   On: 10/16/2014 19:22    Microbiology: Recent Results (from the past 240 hour(s))  Clostridium Difficile by PCR     Status: None   Collection Time: 10/18/14  6:24 AM  Result Value Ref Range Status   C difficile by pcr NEGATIVE NEGATIVE Final    Comment: Performed at [place]: Basic Metabolic Panel:  Recent Labs Lab 10/16/14 1446 10/17/14 0405 10/18/14 0515  NA 145 143 143  K 3.1* 3.1* 3.5  CL 108 108 108  CO2 _0 GLUCOSE 188* 143* 127*  BUN _1 CREATININE 1.31 1.46* 1.47*  CALCIUM 8.8 8.3* 8.6   Liver Function Tests:  Recent Labs Lab 10/17/14 0405  AST 16  ALT 12  ALKPHOS 66  BILITOT 0.4  PROT 6.3  ALBUMIN 2.5*   No results for input(s): LIPASE, AMYLASE in the last 168 hours. No results for input(s): AMMONIA in the last 168 hours. CBC:  Recent Labs Lab 10/16/14 1446 10/17/14 0405  WBC 7.2 6.8  NEUTROABS  --  3.1  HGB 10.2* 8.8*  HCT 31.5* 27.1*  MCV 91.0 91.2  PLT 462* 425*   Cardiac Enzymes:  Recent Labs Lab 10/16/14 1629 10/16/14 2156 10/17/14 0405 10/17/14 1008  TROPONINI 0.04* 0.03 0.04* TEST CANCELLED PER RN   BNP: BNP (last 3 results)  Recent Labs  10/16/14 1630 10/17/14 0405  BNP 1260.2* 1175.6*    ProBNP (last 3 results) No results for input(s): PROBNP in the last 8760 hours.  CBG:  Recent Labs Lab 10/16/14 2207 10/17/14 0746 10/17/14 1155 10/17/14 1724 10/17/14 2217  GLUCAP 117* 130* 142* 146* 156*       Signed:  Courtez Twaddle  [facility] 10/18/2014, 11:42 AM

## 2014-10-18 NOTE — Progress Notes (Signed)
Patient discharged home, discharge instructions given and explained to patient and he verbalized understanding, denies any pain/distress. Transported to the car by staff via wheelchair. SKin intact, no wound noted.

## 2014-10-18 NOTE — Discharge Instructions (Signed)

## 2014-10-21 ENCOUNTER — Ambulatory Visit: Payer: Medicaid Other | Attending: Internal Medicine | Admitting: Internal Medicine

## 2014-10-21 ENCOUNTER — Encounter: Payer: Self-pay | Admitting: Internal Medicine

## 2014-10-21 VITALS — BP 150/95 | HR 76 | Temp 98.0°F | Resp 16 | Wt 207.6 lb

## 2014-10-21 DIAGNOSIS — Z794 Long term (current) use of insulin: Secondary | ICD-10-CM | POA: Diagnosis not present

## 2014-10-21 DIAGNOSIS — Z7982 Long term (current) use of aspirin: Secondary | ICD-10-CM | POA: Insufficient documentation

## 2014-10-21 DIAGNOSIS — N179 Acute kidney failure, unspecified: Secondary | ICD-10-CM | POA: Diagnosis not present

## 2014-10-21 DIAGNOSIS — I5032 Chronic diastolic (congestive) heart failure: Secondary | ICD-10-CM | POA: Insufficient documentation

## 2014-10-21 DIAGNOSIS — I10 Essential (primary) hypertension: Secondary | ICD-10-CM | POA: Diagnosis not present

## 2014-10-21 DIAGNOSIS — Z87891 Personal history of nicotine dependence: Secondary | ICD-10-CM | POA: Insufficient documentation

## 2014-10-21 DIAGNOSIS — E139 Other specified diabetes mellitus without complications: Secondary | ICD-10-CM

## 2014-10-21 DIAGNOSIS — Z89512 Acquired absence of left leg below knee: Secondary | ICD-10-CM | POA: Diagnosis not present

## 2014-10-21 DIAGNOSIS — I509 Heart failure, unspecified: Secondary | ICD-10-CM

## 2014-10-21 DIAGNOSIS — E119 Type 2 diabetes mellitus without complications: Secondary | ICD-10-CM | POA: Diagnosis not present

## 2014-10-21 DIAGNOSIS — Z9081 Acquired absence of spleen: Secondary | ICD-10-CM | POA: Insufficient documentation

## 2014-10-21 LAB — COMPLETE METABOLIC PANEL WITH GFR
ALT: 13 U/L (ref 0–53)
AST: 14 U/L (ref 0–37)
Albumin: 3.1 g/dL — ABNORMAL LOW (ref 3.5–5.2)
Alkaline Phosphatase: 71 U/L (ref 39–117)
BUN: 24 mg/dL — ABNORMAL HIGH (ref 6–23)
CO2: 27 mEq/L (ref 19–32)
Calcium: 9.3 mg/dL (ref 8.4–10.5)
Chloride: 106 mEq/L (ref 96–112)
Creat: 1.46 mg/dL — ABNORMAL HIGH (ref 0.50–1.35)
GFR, Est African American: 58 mL/min — ABNORMAL LOW
GFR, Est Non African American: 50 mL/min — ABNORMAL LOW
Glucose, Bld: 208 mg/dL — ABNORMAL HIGH (ref 70–99)
Potassium: 4.7 mEq/L (ref 3.5–5.3)
Sodium: 143 mEq/L (ref 135–145)
Total Bilirubin: 0.3 mg/dL (ref 0.2–1.2)
Total Protein: 6.8 g/dL (ref 6.0–8.3)

## 2014-10-21 LAB — POCT GLYCOSYLATED HEMOGLOBIN (HGB A1C): Hemoglobin A1C: 7.3

## 2014-10-21 MED ORDER — CARVEDILOL 3.125 MG PO TABS
3.1250 mg | ORAL_TABLET | Freq: Two times a day (BID) | ORAL | Status: DC
Start: 2014-10-21 — End: 2014-11-20

## 2014-10-21 MED ORDER — LISINOPRIL 20 MG PO TABS: 20.0000 mg | ORAL_TABLET | Freq: Every day | ORAL | Status: DC

## 2014-10-21 NOTE — Progress Notes (Signed)
Patient here for follow up from hospital Was admitted for CHF Requesting refill on his lisinopril

## 2014-10-21 NOTE — Patient Instructions (Signed)
Diabetes Mellitus and Food It is important for you to manage your blood sugar (glucose) level. Your blood glucose level can be greatly affected by what you eat. Eating healthier foods in the appropriate amounts throughout the day at about the same time each day will help you control your blood glucose level. It can also help slow or prevent worsening of your diabetes mellitus. Healthy eating may even help you improve the level of your blood pressure and reach or maintain a healthy weight.  HOW CAN FOOD AFFECT ME? Carbohydrates Carbohydrates affect your blood glucose level more than any other type of food. Your dietitian will help you determine how many carbohydrates to eat at each meal and teach you how to count carbohydrates. Counting carbohydrates is important to keep your blood glucose at a healthy level, especially if you are using insulin or taking certain medicines for diabetes mellitus. Alcohol Alcohol can cause sudden decreases in blood glucose (hypoglycemia), especially if you use insulin or take certain medicines for diabetes mellitus. Hypoglycemia can be a life-threatening condition. Symptoms of hypoglycemia (sleepiness, dizziness, and disorientation) are similar to symptoms of having too much alcohol.  If your health care provider has given you approval to drink alcohol, do so in moderation and use the following guidelines:  Women should not have more than one drink per day, and men should not have more than two drinks per day. One drink is equal to:  12 oz of beer.  5 oz of wine.  1 oz of hard liquor.  Do not drink on an empty stomach.  Keep yourself hydrated. Have water, diet soda, or unsweetened iced tea.  Regular soda, juice, and other mixers might contain a lot of carbohydrates and should be counted. WHAT FOODS ARE NOT RECOMMENDED? As you make food choices, it is important to remember that all foods are not the same. Some foods have fewer nutrients per serving than other  foods, even though they might have the same number of calories or carbohydrates. It is difficult to get your body what it needs when you eat foods with fewer nutrients. Examples of foods that you should avoid that are high in calories and carbohydrates but low in nutrients include:  Trans fats (most processed foods list trans fats on the Nutrition Facts label).  Regular soda.  Juice.  Candy.  Sweets, such as cake, pie, doughnuts, and cookies.  Fried foods. WHAT FOODS CAN I EAT? Have nutrient-rich foods, which will nourish your body and keep you healthy. The food you should eat also will depend on several factors, including:  The calories you need.  The medicines you take.  Your weight.  Your blood glucose level.  Your blood pressure level.  Your cholesterol level. You also should eat a variety of foods, including:  Protein, such as meat, poultry, fish, tofu, nuts, and seeds (lean animal proteins are best).  Fruits.  Vegetables.  Dairy products, such as milk, cheese, and yogurt (low fat is best).  Breads, grains, pasta, cereal, rice, and beans.  Fats such as olive oil, trans fat-free margarine, canola oil, avocado, and olives. DOES EVERYONE WITH DIABETES MELLITUS HAVE THE SAME MEAL PLAN? Because every person with diabetes mellitus is different, there is not one meal plan that works for everyone. It is very important that you meet with a dietitian who will help you create a meal plan that is just right for you. Document Released: 05/13/2005 Document Revised: 08/21/2013 Document Reviewed: 07/13/2013 ExitCare Patient Information 2015 [employer]. This   information is not intended to replace advice given to you by your health care provider. Make sure you discuss any questions you have with your health care provider. DASH Eating Plan DASH stands for "Dietary Approaches to Stop Hypertension." The DASH eating plan is a healthy eating plan that has been shown to reduce high  blood pressure (hypertension). Additional health benefits may include reducing the risk of type 2 diabetes mellitus, heart disease, and stroke. The DASH eating plan may also help with weight loss. WHAT DO I NEED TO KNOW ABOUT THE DASH EATING PLAN? For the DASH eating plan, you will follow these general guidelines:  Choose foods with a percent daily value for sodium of less than 5% (as listed on the food label).  Use salt-free seasonings or herbs instead of table salt or sea salt.  Check with your health care provider or pharmacist before using salt substitutes.  Eat lower-sodium products, often labeled as "lower sodium" or "no salt added."  Eat fresh foods.  Eat more vegetables, fruits, and low-fat dairy products.  Choose whole grains. Look for the word "whole" as the first word in the ingredient list.  Choose fish and skinless chicken or turkey more often than red meat. Limit fish, poultry, and meat to 6 oz (170 g) each day.  Limit sweets, desserts, sugars, and sugary drinks.  Choose heart-healthy fats.  Limit cheese to 1 oz (28 g) per day.  Eat more home-cooked food and less restaurant, buffet, and fast food.  Limit fried foods.  Cook foods using methods other than frying.  Limit canned vegetables. If you do use them, rinse them well to decrease the sodium.  When eating at a restaurant, ask that your food be prepared with less salt, or no salt if possible. WHAT FOODS CAN I EAT? Seek help from a dietitian for individual calorie needs. Grains Whole grain or whole wheat bread. Brown rice. Whole grain or whole wheat pasta. Quinoa, bulgur, and whole grain cereals. Low-sodium cereals. Corn or whole wheat flour tortillas. Whole grain cornbread. Whole grain crackers. Low-sodium crackers. Vegetables Fresh or frozen vegetables (raw, steamed, roasted, or grilled). Low-sodium or reduced-sodium tomato and vegetable juices. Low-sodium or reduced-sodium tomato sauce and paste. Low-sodium  or reduced-sodium canned vegetables.  Fruits All fresh, canned (in natural juice), or frozen fruits. Meat and Other Protein Products Ground beef (85% or leaner), grass-fed beef, or beef trimmed of fat. Skinless chicken or turkey. Ground chicken or turkey. Pork trimmed of fat. All fish and seafood. Eggs. Dried beans, peas, or lentils. Unsalted nuts and seeds. Unsalted canned beans. Dairy Low-fat dairy products, such as skim or 1% milk, 2% or reduced-fat cheeses, low-fat ricotta or cottage cheese, or plain low-fat yogurt. Low-sodium or reduced-sodium cheeses. Fats and Oils Tub margarines without trans fats. Light or reduced-fat mayonnaise and salad dressings (reduced sodium). Avocado. Safflower, olive, or canola oils. Natural peanut or almond butter. Other Unsalted popcorn and pretzels. The items listed above may not be a complete list of recommended foods or beverages. Contact your dietitian for more options. WHAT FOODS ARE NOT RECOMMENDED? Grains White bread. White pasta. White rice. Refined cornbread. Bagels and croissants. Crackers that contain trans fat. Vegetables Creamed or fried vegetables. Vegetables in a cheese sauce. Regular canned vegetables. Regular canned tomato sauce and paste. Regular tomato and vegetable juices. Fruits Dried fruits. Canned fruit in light or heavy syrup. Fruit juice. Meat and Other Protein Products Fatty cuts of meat. Ribs, chicken wings, bacon, sausage, bologna, salami, chitterlings, fatback, hot   dogs, bratwurst, and packaged luncheon meats. Salted nuts and seeds. Canned beans with salt. Dairy Whole or 2% milk, cream, half-and-half, and cream cheese. Whole-fat or sweetened yogurt. Full-fat cheeses or blue cheese. Nondairy creamers and whipped toppings. Processed cheese, cheese spreads, or cheese curds. Condiments Onion and garlic salt, seasoned salt, table salt, and sea salt. Canned and packaged gravies. Worcestershire sauce. Tartar sauce. Barbecue sauce.  Teriyaki sauce. Soy sauce, including reduced sodium. Steak sauce. Fish sauce. Oyster sauce. Cocktail sauce. Horseradish. Ketchup and mustard. Meat flavorings and tenderizers. Bouillon cubes. Hot sauce. Tabasco sauce. Marinades. Taco seasonings. Relishes. Fats and Oils Butter, stick margarine, lard, shortening, ghee, and bacon fat. Coconut, palm kernel, or palm oils. Regular salad dressings. Other Pickles and olives. Salted popcorn and pretzels. The items listed above may not be a complete list of foods and beverages to avoid. Contact your dietitian for more information. WHERE CAN I FIND MORE INFORMATION? National Heart, Lung, and [employer] Institute: www.nhlbi.nih.gov/health/health-topics/topics/dash/ Document Released: 08/05/2011 Document Revised: 12/31/2013 Document Reviewed: 06/20/2013 ExitCare Patient Information 2015 [employer]. This information is not intended to replace advice given to you by your health care provider. Make sure you discuss any questions you have with your health care provider.  
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
## 2014-10-23 ENCOUNTER — Telehealth: Payer: Self-pay

## 2014-10-23 DIAGNOSIS — E139 Other specified diabetes mellitus without complications: Secondary | ICD-10-CM

## 2014-10-23 MED ORDER — GLIPIZIDE 5 MG PO TABS: 5.0000 mg | ORAL_TABLET | Freq: Two times a day (BID) | ORAL | Status: DC

## 2014-10-23 NOTE — Telephone Encounter (Signed)
-----   Message from Lorayne Marek, MD sent at 10/22/2014 10:05 AM EST ----- Call and let the patient know that his renal function is unchanged and creatinine is elevated, advise patient to discontinue metformin and start taking Glucotrol 5 mg twice a day, continue with Lantus, will repeat blood chemistry on the following visit.

## 2014-10-23 NOTE — Telephone Encounter (Signed)
Spoke with patient He is aware of his lab results and [facility]

## 2014-10-24 ENCOUNTER — Telehealth: Payer: Self-pay | Admitting: Internal Medicine

## 2014-10-24 NOTE — Telephone Encounter (Signed)
Nurse from [facility] called requesting social worker consult order for patient . Please contact nurse accordingly.

## 2014-10-30 ENCOUNTER — Ambulatory Visit: Payer: Medicaid Other | Admitting: Cardiology

## 2014-10-30 ENCOUNTER — Other Ambulatory Visit: Payer: Self-pay

## 2014-10-30 MED ORDER — "INSULIN SYRINGE 30G X 5/16"" 0.5 ML MISC": Status: AC

## 2014-11-06 ENCOUNTER — Ambulatory Visit: Payer: Medicaid Other | Admitting: Cardiology

## 2014-11-11 ENCOUNTER — Telehealth: Payer: Self-pay | Admitting: Internal Medicine

## 2014-11-11 NOTE — Telephone Encounter (Signed)
Please f/u with pt

## 2014-11-11 NOTE — Telephone Encounter (Signed)
Nurse from [facility] called to request orders for the patient, please f/u.

## 2014-11-14 ENCOUNTER — Telehealth: Payer: Self-pay | Admitting: Internal Medicine

## 2014-11-14 NOTE — Telephone Encounter (Signed)
Elenore Paddy from [facility] called checking the status of plan of care. Please f/u with nurse if any additional information is needed.

## 2014-11-20 ENCOUNTER — Ambulatory Visit: Payer: Medicaid Other | Attending: Cardiology | Admitting: Cardiology

## 2014-11-20 ENCOUNTER — Encounter: Payer: Self-pay | Admitting: Cardiology

## 2014-11-20 VITALS — BP 138/68 | HR 72 | Temp 98.5°F | Resp 18 | Ht 74.0 in | Wt 189.0 lb

## 2014-11-20 DIAGNOSIS — Z89512 Acquired absence of left leg below knee: Secondary | ICD-10-CM

## 2014-11-20 DIAGNOSIS — I350 Nonrheumatic aortic (valve) stenosis: Secondary | ICD-10-CM | POA: Insufficient documentation

## 2014-11-20 DIAGNOSIS — E1159 Type 2 diabetes mellitus with other circulatory complications: Secondary | ICD-10-CM

## 2014-11-20 DIAGNOSIS — J90 Pleural effusion, not elsewhere classified: Secondary | ICD-10-CM | POA: Insufficient documentation

## 2014-11-20 DIAGNOSIS — N189 Chronic kidney disease, unspecified: Secondary | ICD-10-CM | POA: Insufficient documentation

## 2014-11-20 DIAGNOSIS — I129 Hypertensive chronic kidney disease with stage 1 through stage 4 chronic kidney disease, or unspecified chronic kidney disease: Secondary | ICD-10-CM | POA: Diagnosis not present

## 2014-11-20 DIAGNOSIS — Z87891 Personal history of nicotine dependence: Secondary | ICD-10-CM | POA: Insufficient documentation

## 2014-11-20 DIAGNOSIS — I5042 Chronic combined systolic (congestive) and diastolic (congestive) heart failure: Secondary | ICD-10-CM | POA: Diagnosis not present

## 2014-11-20 DIAGNOSIS — I509 Heart failure, unspecified: Secondary | ICD-10-CM | POA: Diagnosis not present

## 2014-11-20 DIAGNOSIS — E1151 Type 2 diabetes mellitus with diabetic peripheral angiopathy without gangrene: Secondary | ICD-10-CM | POA: Diagnosis not present

## 2014-11-20 DIAGNOSIS — I340 Nonrheumatic mitral (valve) insufficiency: Secondary | ICD-10-CM | POA: Diagnosis not present

## 2014-11-20 DIAGNOSIS — Z794 Long term (current) use of insulin: Secondary | ICD-10-CM | POA: Diagnosis not present

## 2014-11-20 DIAGNOSIS — Z7982 Long term (current) use of aspirin: Secondary | ICD-10-CM | POA: Diagnosis not present

## 2014-11-20 MED ORDER — CARVEDILOL 3.125 MG PO TABS: 3.1250 mg | ORAL_TABLET | Freq: Two times a day (BID) | ORAL | Status: DC

## 2014-11-20 MED ORDER — FUROSEMIDE 40 MG PO TABS: 40.0000 mg | ORAL_TABLET | Freq: Every day | ORAL | Status: DC

## 2014-11-20 MED ORDER — ATORVASTATIN CALCIUM 20 MG PO TABS: 20.0000 mg | ORAL_TABLET | Freq: Every day | ORAL | Status: DC

## 2014-11-20 NOTE — Progress Notes (Signed)
HPI [name] is a 64 year old black male who comes in today per request of Dr.aDVANI for the evaluation and management of his combined systolic and diastolic congestive heart failure.  He was admitted to the hospital in February with progressive shortness of breath. Chest x-ray showed large bilateral pleural effusions. CT scan was negative for pulmonary embolus. EKG showed normal sinus rhythm with LVH and strain. Troponins were negative. He was diuresed and feels better. He is having difficulty weighing himself because of the inability to wear his left below the knee prosthesis. Echocardiogram during his hospitalization so global hypokinesia with an ejection fraction of 40-45%. He has moderate mitral regurgitation with left atrial enlargement. He also has aortic valve sclerosis without stenosis.  He denies orthopnea PND or increased edema. He has a history of type 2 diabetes mellitus with peripheral vascular disease and history of sepsis from an unhealing wound in February 2015. This resulted in amputation. He quit smoking at that time. He denies any claudication or symptoms in his right lower extremity. He does have a scab that field off when I ask him to remove his sock on his right tibia. There is no sign of infection.  He is currently on beta blocker, ACE inhibitor, aspirin, and Lasix. He just ran out of his Lasix.  Past Medical History  Diagnosis Date  . Diverticulitis   . Spleen absent   . Hypertension     no pcp  . CHF (congestive heart failure)   . Chronic kidney disease   . DM (diabetes mellitus), type 2 with peripheral vascular complications     Current Outpatient Prescriptions  Medication Sig Dispense Refill  . aspirin EC 81 MG EC tablet Take 1 tablet (81 mg total) by mouth daily. 30 tablet 0  . Blood Glucose Monitoring Suppl (ACCU-CHEK AVIVA PLUS) W/DEVICE KIT Use as prescribed TID before meals and QHS 1 kit 0  . carvedilol (COREG) 3.125 MG tablet Take 1 tablet (3.125 mg total)  by mouth 2 (two) times daily with a meal. 60 tablet 3  . ferrous gluconate (FERGON) 324 MG tablet Take 1 tablet (324 mg total) by mouth 2 (two) times daily with a meal. 60 tablet 1  . furosemide (LASIX) 40 MG tablet Take 1 tablet (40 mg total) by mouth daily. 30 tablet 3  . gabapentin (NEURONTIN) 300 MG capsule Take 1 capsule (300 mg total) by mouth 3 (three) times daily. 90 capsule 3  . glipiZIDE (GLUCOTROL) 5 MG tablet Take 1 tablet (5 mg total) by mouth 2 (two) times daily before a meal. 60 tablet 3  . glucose blood (ACCU-CHEK AVIVA) test strip Use as instructed (Patient taking differently: 1 each by Other route 2 (two) times daily. Use as instructed) 100 each 12  . HYDROcodone-acetaminophen (NORCO/VICODIN) 5-325 MG per tablet Take 1 tablet by mouth every 12 (twelve) hours as needed for moderate pain.    . Insulin Glargine (LANTUS SOLOSTAR) 100 UNIT/ML Solostar Pen Inject 10 Units into the skin daily at 10 pm. 10 pen 3  . Insulin Syringe-Needle U-100 (INSULIN SYRINGE .5CC/30GX5/16") 30G X 5/16" 0.5 ML MISC Check blood sugar TID & QHS 100 each 2  . Lancets (ACCU-CHEK SOFT TOUCH) lancets Use as instructed (Patient taking differently: 1 each by Other route 2 (two) times daily. Use as instructed) 100 each 12  . lisinopril (PRINIVIL,ZESTRIL) 20 MG tablet Take 1 tablet (20 mg total) by mouth daily. 30 tablet 3  . atorvastatin (LIPITOR) 20 MG tablet Take 1 tablet (20  mg total) by mouth daily. 30 tablet 3   No current facility-administered medications for this visit.    No Known Allergies  Family History  Problem Relation Age of Onset  . Diabetes Mother   . Cancer Father     History   Social History  . Marital Status: Married    Spouse Name: N/A  . Number of Children: 1  . Years of Education: N/A   Occupational History  . mows grass    Social History Main Topics  . Smoking status: Former Smoker    Quit date: 08/03/2013  . Smokeless tobacco: Never Used     Comment: 3 cigarettes/day   . Alcohol Use: No     Comment: has quit all alcohol  . Drug Use: No  . Sexual Activity: Not on file   Other Topics Concern  . Not on file   Social History Narrative   Lives with his wife.    ROS ALL NEGATIVE EXCEPT THOSE NOTED IN HPI  PE  General Appearance: well developed,  in no acute distress, disheveled, looks older than stated age. HEENT: symmetrical face, PERRLA,  Neck: no JVD, thyromegaly, or adenopathy, trachea midline Chest: symmetric without deformity Cardiac: PMI displaced inferolaterally., RRR, normal S1, S2, no gallop , soft systolic murmur left sternal border Lung: Clear on the right, decreased breath sounds left base consistent with effusion Vascular: Right lower extremity with minimal pulses   carotids full without bruits.Abdominal: nondistended, nontender, good bowel sounds,  Extremities: no cyanosis, clubbing,  chronic edematous changes of the right lower extremity, small noninfected area of the medial tibia on the right lower extremity. The scab fell off  when we removed his sock,.no sign of DVT, no varicosities  Skin: normal color, no rashes Neuro: alert and oriented x 3, non-focal Pysch: normal affect  EKG  BMET    Component Value Date/Time   NA 143 10/21/2014 1503   K 4.7 10/21/2014 1503   CL 106 10/21/2014 1503   CO2 27 10/21/2014 1503   GLUCOSE 208* 10/21/2014 1503   BUN 24* 10/21/2014 1503   CREATININE 1.46* 10/21/2014 1503   CREATININE 1.47* 10/18/2014 0515   CALCIUM 9.3 10/21/2014 1503   GFRNONAA 50* 10/21/2014 1503   GFRNONAA 49* 10/18/2014 0515   GFRAA 58* 10/21/2014 1503   GFRAA 57* 10/18/2014 0515    Lipid Panel  No results found for: CHOL, TRIG, HDL, CHOLHDL, VLDL, LDLCALC  CBC    Component Value Date/Time   WBC 6.8 10/17/2014 0405   WBC 26.8* 09/26/2013 1931   RBC 2.97* 10/17/2014 0405   RBC 2.97* 10/17/2014 0405   RBC 3.53* 09/26/2013 1931   HGB 8.8* 10/17/2014 0405   HGB 10.4* 09/26/2013 1931   HCT 27.1* 10/17/2014  0405   HCT 34.4* 09/26/2013 1931   PLT 425* 10/17/2014 0405   MCV 91.2 10/17/2014 0405   MCV 97.5* 09/26/2013 1931   MCH 29.6 10/17/2014 0405   MCH 29.5 09/26/2013 1931   MCHC 32.5 10/17/2014 0405   MCHC 30.2* 09/26/2013 1931   RDW 14.4 10/17/2014 0405   LYMPHSABS 2.7 10/17/2014 0405   MONOABS 0.7 10/17/2014 0405   EOSABS 0.4 10/17/2014 0405   BASOSABS 0.1 10/17/2014 0405

## 2014-11-20 NOTE — Assessment & Plan Note (Signed)
Stump l looks stable. He has a follow-up appointment with orthopedic surgery with Dr. Sharol Given next Tuesday.

## 2014-11-20 NOTE — Assessment & Plan Note (Addendum)
Hemoglobin A1c 7.3. At goal. I'm concerned about the area of his right lower extremity mid tibia. We have cleaned it today in the office and he will clean it twice a day with warm water and soap. If it does not heal or there are signs of reddening or worsening he will let us know here at the clinic. We need to try to avoid any recurrent complications as he experienced with his left lower extremity. This included sepsis and amputation. We made it very clear that he needs to be very proactive with this. He was also given money to buy some white socks that are clean. We'll see him back in 4 weeks.

## 2014-11-20 NOTE — Progress Notes (Signed)
Patient here for evaluation of congestive heart failure. Patient diagnosed with CHF 2/16 when hospitalized. Patient indicates he has been compliant with medications and denies shortness of breath, chest pain, lower extremity swelling, headaches, or dizziness. Patient indicates he was weighing himself daily until he was informed by physician to stop wearing prothesis due to BKA swelling.  Last weighed himself 3 days ago.

## 2014-11-20 NOTE — Patient Instructions (Signed)
It was great meeting you today. Carvedilol 3.125 mg twice daily and Lasix have been refilled and sent to [facility]. You have been started on Atorvastatin 20 mg daily at night.  Please return to clinic in six weeks for labwork. An appointment has been scheduled. Dr. Verl Blalock would like for you to have a follow-up Chest X-Ray. This can be done at [facility], Radiology.

## 2014-11-20 NOTE — Assessment & Plan Note (Signed)
This is relatively stable. By exam he has a small left pleural effusion. Follow-up chest x-ray will be obtained for documentation. We have renewed his medications. I will see him back in 4 weeks. He most likely has underlying ischemic heart disease considering his type 2 diabetes mellitus with peripheral vascular complications. He is asymptomatic. Continue aspirin, beta blocker, lisinopril and I will start a statin for secondary prevention. Follow-up lipids in 6 weeks with a comprehensive metabolic profile.

## 2014-11-26 ENCOUNTER — Telehealth: Payer: Self-pay | Admitting: Internal Medicine

## 2014-11-26 NOTE — Telephone Encounter (Signed)
Nurse from [facility] called stating  that the patient is refusing home care services. She states that he is unable to weigh because his prosthesis will not fit on his Left leg due to swelling, she also states that his fasting breakfast blood sugar levels have been between 91-167. She would like to speak to nurse in regards to his care. Please f/u

## 2014-12-18 ENCOUNTER — Ambulatory Visit: Payer: Medicaid Other | Admitting: Cardiology

## 2014-12-25 ENCOUNTER — Encounter: Payer: Self-pay | Admitting: Cardiology

## 2014-12-25 ENCOUNTER — Ambulatory Visit: Payer: Medicaid Other | Attending: Cardiology | Admitting: Cardiology

## 2014-12-25 VITALS — BP 170/99 | HR 86 | Temp 98.6°F | Resp 18 | Ht 74.0 in | Wt 197.8 lb

## 2014-12-25 DIAGNOSIS — I5042 Chronic combined systolic (congestive) and diastolic (congestive) heart failure: Secondary | ICD-10-CM | POA: Diagnosis not present

## 2014-12-25 DIAGNOSIS — I10 Essential (primary) hypertension: Secondary | ICD-10-CM | POA: Diagnosis not present

## 2014-12-25 MED ORDER — LISINOPRIL 20 MG PO TABS: 20.0000 mg | ORAL_TABLET | Freq: Every day | ORAL | Status: DC

## 2014-12-25 NOTE — Assessment & Plan Note (Signed)
Stable. Continue current medical program. We have made it possible for her to get his meds today. He will let us know if he begins to run out in the future. He will keep a close watch on his right lower extremity for any signs of infection. I will see him back in 6 months.
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
## 2014-12-25 NOTE — Patient Instructions (Signed)
Please pick up Coreg, Lasix, and Lisinopril today at Pharmacy and take as prescribed. Please notify pharmacy that refills needed 7 days prior to running out. Continue to clean wound on right leg daily and as needed.  Watch for signs of infection: redness, drainage, swelling, increased pain, fever. Call office immediately if you see signs of infection or wound worsens. Continue to weigh yourself daily.

## 2014-12-25 NOTE — Progress Notes (Signed)
Mark [name] returns today for follow-up of his chronic systolic and diastolic heart failure. From that standpoint, he is doing well. He did run out of his medicines 2 days ago. He denies orthopnea, PND and his edema has improved both in his right lower extremity and his stump.  His exam today is notable for ringing in no acute distress. Blood pressures high from not taking his medications. Lungs were clear to auscultation. He's got mild JVD. Heart reveals no gallop. He has a systolic murmur along the left sternal border and at the apex. On exam is soft and nondistended. Right lower extremity shows minimal edema. He has a bounding dorsalis pedis pulse. He does have an area where he scraped his shin that is not infected. We cleaned and redressed it.

## 2014-12-31 ENCOUNTER — Other Ambulatory Visit: Payer: Self-pay | Admitting: Family Medicine

## 2014-12-31 DIAGNOSIS — I5042 Chronic combined systolic (congestive) and diastolic (congestive) heart failure: Secondary | ICD-10-CM

## 2014-12-31 DIAGNOSIS — I509 Heart failure, unspecified: Secondary | ICD-10-CM

## 2014-12-31 MED ORDER — FUROSEMIDE 40 MG PO TABS: 40.0000 mg | ORAL_TABLET | Freq: Every day | ORAL | Status: DC

## 2015-01-01 ENCOUNTER — Other Ambulatory Visit: Payer: Medicaid Other

## 2015-02-24 ENCOUNTER — Other Ambulatory Visit: Payer: Self-pay

## 2015-02-24 ENCOUNTER — Other Ambulatory Visit: Payer: Self-pay | Admitting: Internal Medicine

## 2015-03-11 ENCOUNTER — Encounter: Payer: Self-pay | Admitting: Internal Medicine

## 2015-03-11 ENCOUNTER — Ambulatory Visit: Payer: Medicaid Other | Attending: Internal Medicine | Admitting: Internal Medicine

## 2015-03-11 VITALS — BP 130/70 | HR 79 | Temp 98.0°F | Resp 16 | Wt 205.6 lb

## 2015-03-11 DIAGNOSIS — Z87891 Personal history of nicotine dependence: Secondary | ICD-10-CM | POA: Diagnosis not present

## 2015-03-11 DIAGNOSIS — N289 Disorder of kidney and ureter, unspecified: Secondary | ICD-10-CM | POA: Diagnosis not present

## 2015-03-11 DIAGNOSIS — I10 Essential (primary) hypertension: Secondary | ICD-10-CM | POA: Insufficient documentation

## 2015-03-11 DIAGNOSIS — E139 Other specified diabetes mellitus without complications: Secondary | ICD-10-CM | POA: Diagnosis not present

## 2015-03-11 DIAGNOSIS — Z794 Long term (current) use of insulin: Secondary | ICD-10-CM | POA: Insufficient documentation

## 2015-03-11 DIAGNOSIS — I5042 Chronic combined systolic (congestive) and diastolic (congestive) heart failure: Secondary | ICD-10-CM

## 2015-03-11 LAB — COMPLETE METABOLIC PANEL WITH GFR
ALT: 22 U/L (ref 0–53)
AST: 21 U/L (ref 0–37)
Albumin: 2.9 g/dL — ABNORMAL LOW (ref 3.5–5.2)
Alkaline Phosphatase: 77 U/L (ref 39–117)
BUN: 22 mg/dL (ref 6–23)
CO2: 31 mEq/L (ref 19–32)
Calcium: 9.1 mg/dL (ref 8.4–10.5)
Chloride: 103 mEq/L (ref 96–112)
Creat: 1.46 mg/dL — ABNORMAL HIGH (ref 0.50–1.35)
GFR, Est African American: 58 mL/min — ABNORMAL LOW
GFR, Est Non African American: 50 mL/min — ABNORMAL LOW
Glucose, Bld: 201 mg/dL — ABNORMAL HIGH (ref 70–99)
Potassium: 4.4 mEq/L (ref 3.5–5.3)
Sodium: 144 mEq/L (ref 135–145)
Total Bilirubin: 0.2 mg/dL (ref 0.2–1.2)
Total Protein: 6.6 g/dL (ref 6.0–8.3)

## 2015-03-11 LAB — GLUCOSE, POCT (MANUAL RESULT ENTRY): POC Glucose: 212 mg/dl — AB (ref 70–99)

## 2015-03-11 LAB — POCT GLYCOSYLATED HEMOGLOBIN (HGB A1C): Hemoglobin A1C: 6.6

## 2015-03-11 MED ORDER — GLIPIZIDE 5 MG PO TABS: 5.0000 mg | ORAL_TABLET | Freq: Two times a day (BID) | ORAL | Status: DC

## 2015-03-11 MED ORDER — LISINOPRIL 20 MG PO TABS: 20.0000 mg | ORAL_TABLET | Freq: Every day | ORAL | Status: DC

## 2015-03-11 MED ORDER — ATORVASTATIN CALCIUM 20 MG PO TABS: 20.0000 mg | ORAL_TABLET | Freq: Every day | ORAL | Status: DC

## 2015-03-11 MED ORDER — CARVEDILOL 3.125 MG PO TABS: 3.1250 mg | ORAL_TABLET | Freq: Two times a day (BID) | ORAL | Status: DC

## 2015-03-11 NOTE — Patient Instructions (Signed)
Diabetes Mellitus and Food It is important for you to manage your blood sugar (glucose) level. Your blood glucose level can be greatly affected by what you eat. Eating healthier foods in the appropriate amounts throughout the day at about the same time each day will help you control your blood glucose level. It can also help slow or prevent worsening of your diabetes mellitus. Healthy eating may even help you improve the level of your blood pressure and reach or maintain a healthy weight.  HOW CAN FOOD AFFECT ME? Carbohydrates Carbohydrates affect your blood glucose level more than any other type of food. Your dietitian will help you determine how many carbohydrates to eat at each meal and teach you how to count carbohydrates. Counting carbohydrates is important to keep your blood glucose at a healthy level, especially if you are using insulin or taking certain medicines for diabetes mellitus. Alcohol Alcohol can cause sudden decreases in blood glucose (hypoglycemia), especially if you use insulin or take certain medicines for diabetes mellitus. Hypoglycemia can be a life-threatening condition. Symptoms of hypoglycemia (sleepiness, dizziness, and disorientation) are similar to symptoms of having too much alcohol.  If your health care provider has given you approval to drink alcohol, do so in moderation and use the following guidelines:  Women should not have more than one drink per day, and men should not have more than two drinks per day. One drink is equal to:  12 oz of beer.  5 oz of wine.  1 oz of hard liquor.  Do not drink on an empty stomach.  Keep yourself hydrated. Have water, diet soda, or unsweetened iced tea.  Regular soda, juice, and other mixers might contain a lot of carbohydrates and should be counted. WHAT FOODS ARE NOT RECOMMENDED? As you make food choices, it is important to remember that all foods are not the same. Some foods have fewer nutrients per serving than other  foods, even though they might have the same number of calories or carbohydrates. It is difficult to get your body what it needs when you eat foods with fewer nutrients. Examples of foods that you should avoid that are high in calories and carbohydrates but low in nutrients include:  Trans fats (most processed foods list trans fats on the Nutrition Facts label).  Regular soda.  Juice.  Candy.  Sweets, such as cake, pie, doughnuts, and cookies.  Fried foods. WHAT FOODS CAN I EAT? Have nutrient-rich foods, which will nourish your body and keep you healthy. The food you should eat also will depend on several factors, including:  The calories you need.  The medicines you take.  Your weight.  Your blood glucose level.  Your blood pressure level.  Your cholesterol level. You also should eat a variety of foods, including:  Protein, such as meat, poultry, fish, tofu, nuts, and seeds (lean animal proteins are best).  Fruits.  Vegetables.  Dairy products, such as milk, cheese, and yogurt (low fat is best).  Breads, grains, pasta, cereal, rice, and beans.  Fats such as olive oil, trans fat-free margarine, canola oil, avocado, and olives. DOES EVERYONE WITH DIABETES MELLITUS HAVE THE SAME MEAL PLAN? Because every person with diabetes mellitus is different, there is not one meal plan that works for everyone. It is very important that you meet with a dietitian who will help you create a meal plan that is just right for you. Document Released: 05/13/2005 Document Revised: 08/21/2013 Document Reviewed: 07/13/2013 ExitCare Patient Information 2015 [employer]. This   information is not intended to replace advice given to you by your health care provider. Make sure you discuss any questions you have with your health care provider. DASH Eating Plan DASH stands for "Dietary Approaches to Stop Hypertension." The DASH eating plan is a healthy eating plan that has been shown to reduce high  blood pressure (hypertension). Additional health benefits may include reducing the risk of type 2 diabetes mellitus, heart disease, and stroke. The DASH eating plan may also help with weight loss. WHAT DO I NEED TO KNOW ABOUT THE DASH EATING PLAN? For the DASH eating plan, you will follow these general guidelines:  Choose foods with a percent daily value for sodium of less than 5% (as listed on the food label).  Use salt-free seasonings or herbs instead of table salt or sea salt.  Check with your health care provider or pharmacist before using salt substitutes.  Eat lower-sodium products, often labeled as "lower sodium" or "no salt added."  Eat fresh foods.  Eat more vegetables, fruits, and low-fat dairy products.  Choose whole grains. Look for the word "whole" as the first word in the ingredient list.  Choose fish and skinless chicken or turkey more often than red meat. Limit fish, poultry, and meat to 6 oz (170 g) each day.  Limit sweets, desserts, sugars, and sugary drinks.  Choose heart-healthy fats.  Limit cheese to 1 oz (28 g) per day.  Eat more home-cooked food and less restaurant, buffet, and fast food.  Limit fried foods.  Cook foods using methods other than frying.  Limit canned vegetables. If you do use them, rinse them well to decrease the sodium.  When eating at a restaurant, ask that your food be prepared with less salt, or no salt if possible. WHAT FOODS CAN I EAT? Seek help from a dietitian for individual calorie needs. Grains Whole grain or whole wheat bread. Brown rice. Whole grain or whole wheat pasta. Quinoa, bulgur, and whole grain cereals. Low-sodium cereals. Corn or whole wheat flour tortillas. Whole grain cornbread. Whole grain crackers. Low-sodium crackers. Vegetables Fresh or frozen vegetables (raw, steamed, roasted, or grilled). Low-sodium or reduced-sodium tomato and vegetable juices. Low-sodium or reduced-sodium tomato sauce and paste. Low-sodium  or reduced-sodium canned vegetables.  Fruits All fresh, canned (in natural juice), or frozen fruits. Meat and Other Protein Products Ground beef (85% or leaner), grass-fed beef, or beef trimmed of fat. Skinless chicken or turkey. Ground chicken or turkey. Pork trimmed of fat. All fish and seafood. Eggs. Dried beans, peas, or lentils. Unsalted nuts and seeds. Unsalted canned beans. Dairy Low-fat dairy products, such as skim or 1% milk, 2% or reduced-fat cheeses, low-fat ricotta or cottage cheese, or plain low-fat yogurt. Low-sodium or reduced-sodium cheeses. Fats and Oils Tub margarines without trans fats. Light or reduced-fat mayonnaise and salad dressings (reduced sodium). Avocado. Safflower, olive, or canola oils. Natural peanut or almond butter. Other Unsalted popcorn and pretzels. The items listed above may not be a complete list of recommended foods or beverages. Contact your dietitian for more options. WHAT FOODS ARE NOT RECOMMENDED? Grains White bread. White pasta. White rice. Refined cornbread. Bagels and croissants. Crackers that contain trans fat. Vegetables Creamed or fried vegetables. Vegetables in a cheese sauce. Regular canned vegetables. Regular canned tomato sauce and paste. Regular tomato and vegetable juices. Fruits Dried fruits. Canned fruit in light or heavy syrup. Fruit juice. Meat and Other Protein Products Fatty cuts of meat. Ribs, chicken wings, bacon, sausage, bologna, salami, chitterlings, fatback, hot   dogs, bratwurst, and packaged luncheon meats. Salted nuts and seeds. Canned beans with salt. Dairy Whole or 2% milk, cream, half-and-half, and cream cheese. Whole-fat or sweetened yogurt. Full-fat cheeses or blue cheese. Nondairy creamers and whipped toppings. Processed cheese, cheese spreads, or cheese curds. Condiments Onion and garlic salt, seasoned salt, table salt, and sea salt. Canned and packaged gravies. Worcestershire sauce. Tartar sauce. Barbecue sauce.  Teriyaki sauce. Soy sauce, including reduced sodium. Steak sauce. Fish sauce. Oyster sauce. Cocktail sauce. Horseradish. Ketchup and mustard. Meat flavorings and tenderizers. Bouillon cubes. Hot sauce. Tabasco sauce. Marinades. Taco seasonings. Relishes. Fats and Oils Butter, stick margarine, lard, shortening, ghee, and bacon fat. Coconut, palm kernel, or palm oils. Regular salad dressings. Other Pickles and olives. Salted popcorn and pretzels. The items listed above may not be a complete list of foods and beverages to avoid. Contact your dietitian for more information. WHERE CAN I FIND MORE INFORMATION? National Heart, Lung, and [employer] Institute: www.nhlbi.nih.gov/health/health-topics/topics/dash/ Document Released: 08/05/2011 Document Revised: 12/31/2013 Document Reviewed: 06/20/2013 ExitCare Patient Information 2015 [employer]. This information is not intended to replace advice given to you by your health care provider. Make sure you discuss any questions you have with your health care provider.  

## 2015-03-11 NOTE — Progress Notes (Signed)
Patient here for follow up on his diabetes and hypertension Patient is also in need of prescription refills
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
## 2015-03-12 ENCOUNTER — Telehealth: Payer: Self-pay

## 2015-03-12 NOTE — Telephone Encounter (Signed)
-----   Message from Lorayne Marek, MD sent at 03/12/2015 10:43 AM EDT ----- Call and let the patient know that his kidney function is stable, advise patient to avoid any over-the-counter NSAIDs like ibuprofen or Aleve.

## 2015-03-12 NOTE — Telephone Encounter (Signed)
Patient not available Left message on voice mail to return our call 

## 2015-04-16 ENCOUNTER — Other Ambulatory Visit: Payer: Self-pay | Admitting: Internal Medicine

## 2015-04-16 ENCOUNTER — Inpatient Hospital Stay (HOSPITAL_COMMUNITY)
Admission: EM | Admit: 2015-04-16 | Discharge: 2015-04-17 | DRG: 683 | Discharge status: Against Medical Advice | Payer: Medicaid Other | Attending: Internal Medicine | Admitting: Internal Medicine

## 2015-04-16 ENCOUNTER — Emergency Department (HOSPITAL_COMMUNITY): Payer: Medicaid Other

## 2015-04-16 ENCOUNTER — Encounter (HOSPITAL_COMMUNITY): Payer: Self-pay

## 2015-04-16 ENCOUNTER — Inpatient Hospital Stay (HOSPITAL_COMMUNITY): Payer: Medicaid Other

## 2015-04-16 ENCOUNTER — Other Ambulatory Visit: Payer: Self-pay

## 2015-04-16 DIAGNOSIS — R200 Anesthesia of skin: Secondary | ICD-10-CM | POA: Diagnosis present

## 2015-04-16 DIAGNOSIS — Z89512 Acquired absence of left leg below knee: Secondary | ICD-10-CM

## 2015-04-16 DIAGNOSIS — E440 Moderate protein-calorie malnutrition: Secondary | ICD-10-CM | POA: Diagnosis present

## 2015-04-16 DIAGNOSIS — R55 Syncope and collapse: Secondary | ICD-10-CM | POA: Diagnosis present

## 2015-04-16 DIAGNOSIS — N19 Unspecified kidney failure: Secondary | ICD-10-CM | POA: Diagnosis present

## 2015-04-16 DIAGNOSIS — Z794 Long term (current) use of insulin: Secondary | ICD-10-CM | POA: Diagnosis not present

## 2015-04-16 DIAGNOSIS — L8990 Pressure ulcer of unspecified site, unspecified stage: Secondary | ICD-10-CM | POA: Diagnosis present

## 2015-04-16 DIAGNOSIS — N179 Acute kidney failure, unspecified: Secondary | ICD-10-CM

## 2015-04-16 DIAGNOSIS — E119 Type 2 diabetes mellitus without complications: Secondary | ICD-10-CM

## 2015-04-16 DIAGNOSIS — I5032 Chronic diastolic (congestive) heart failure: Secondary | ICD-10-CM | POA: Diagnosis present

## 2015-04-16 DIAGNOSIS — I129 Hypertensive chronic kidney disease with stage 1 through stage 4 chronic kidney disease, or unspecified chronic kidney disease: Secondary | ICD-10-CM | POA: Diagnosis present

## 2015-04-16 DIAGNOSIS — Z7982 Long term (current) use of aspirin: Secondary | ICD-10-CM

## 2015-04-16 DIAGNOSIS — E1122 Type 2 diabetes mellitus with diabetic chronic kidney disease: Secondary | ICD-10-CM | POA: Diagnosis present

## 2015-04-16 DIAGNOSIS — E785 Hyperlipidemia, unspecified: Secondary | ICD-10-CM | POA: Diagnosis present

## 2015-04-16 DIAGNOSIS — Z833 Family history of diabetes mellitus: Secondary | ICD-10-CM | POA: Diagnosis not present

## 2015-04-16 DIAGNOSIS — I5042 Chronic combined systolic (congestive) and diastolic (congestive) heart failure: Secondary | ICD-10-CM | POA: Diagnosis present

## 2015-04-16 DIAGNOSIS — E118 Type 2 diabetes mellitus with unspecified complications: Secondary | ICD-10-CM

## 2015-04-16 DIAGNOSIS — D638 Anemia in other chronic diseases classified elsewhere: Secondary | ICD-10-CM | POA: Diagnosis present

## 2015-04-16 DIAGNOSIS — D649 Anemia, unspecified: Secondary | ICD-10-CM | POA: Diagnosis present

## 2015-04-16 DIAGNOSIS — Z87891 Personal history of nicotine dependence: Secondary | ICD-10-CM | POA: Diagnosis not present

## 2015-04-16 DIAGNOSIS — N186 End stage renal disease: Secondary | ICD-10-CM

## 2015-04-16 DIAGNOSIS — N189 Chronic kidney disease, unspecified: Secondary | ICD-10-CM

## 2015-04-16 DIAGNOSIS — N184 Chronic kidney disease, stage 4 (severe): Secondary | ICD-10-CM | POA: Diagnosis present

## 2015-04-16 DIAGNOSIS — I10 Essential (primary) hypertension: Secondary | ICD-10-CM

## 2015-04-16 DIAGNOSIS — N183 Chronic kidney disease, stage 3 unspecified: Secondary | ICD-10-CM

## 2015-04-16 DIAGNOSIS — Z89519 Acquired absence of unspecified leg below knee: Secondary | ICD-10-CM

## 2015-04-16 DIAGNOSIS — Z79899 Other long term (current) drug therapy: Secondary | ICD-10-CM

## 2015-04-16 DIAGNOSIS — E860 Dehydration: Secondary | ICD-10-CM | POA: Diagnosis present

## 2015-04-16 HISTORY — DX: Chronic kidney disease, stage 3 unspecified: N18.30

## 2015-04-16 HISTORY — DX: Type 2 diabetes mellitus without complications: E11.9

## 2015-04-16 HISTORY — DX: Acute kidney failure, unspecified: N17.9

## 2015-04-16 LAB — URINALYSIS, ROUTINE W REFLEX MICROSCOPIC
Bilirubin Urine: NEGATIVE
Glucose, UA: NEGATIVE mg/dL
Hgb urine dipstick: NEGATIVE
Ketones, ur: NEGATIVE mg/dL
Leukocytes, UA: NEGATIVE
Nitrite: NEGATIVE
Protein, ur: 100 mg/dL — AB
Specific Gravity, Urine: 1.016 (ref 1.005–1.030)
Urobilinogen, UA: 0.2 mg/dL (ref 0.0–1.0)
pH: 5 (ref 5.0–8.0)

## 2015-04-16 LAB — CBC
HCT: 31.6 % — ABNORMAL LOW (ref 39.0–52.0)
Hemoglobin: 10.4 g/dL — ABNORMAL LOW (ref 13.0–17.0)
MCH: 29.2 pg (ref 26.0–34.0)
MCHC: 32.9 g/dL (ref 30.0–36.0)
MCV: 88.8 fL (ref 78.0–100.0)
Platelets: 479 10*3/uL — ABNORMAL HIGH (ref 150–400)
RBC: 3.56 MIL/uL — ABNORMAL LOW (ref 4.22–5.81)
RDW: 13.3 % (ref 11.5–15.5)
WBC: 7.4 10*3/uL (ref 4.0–10.5)

## 2015-04-16 LAB — BRAIN NATRIURETIC PEPTIDE: B Natriuretic Peptide: 17.5 pg/mL (ref 0.0–100.0)

## 2015-04-16 LAB — BASIC METABOLIC PANEL
Anion gap: 9 (ref 5–15)
BUN: 81 mg/dL — ABNORMAL HIGH (ref 6–20)
CO2: 20 mmol/L — ABNORMAL LOW (ref 22–32)
Calcium: 9.4 mg/dL (ref 8.9–10.3)
Chloride: 106 mmol/L (ref 101–111)
Creatinine, Ser: 4.43 mg/dL — ABNORMAL HIGH (ref 0.61–1.24)
GFR calc Af Amer: 15 mL/min — ABNORMAL LOW (ref >60)
GFR calc non Af Amer: 13 mL/min — ABNORMAL LOW (ref >60)
Glucose, Bld: 189 mg/dL — ABNORMAL HIGH (ref 65–99)
Potassium: 4.1 mmol/L (ref 3.5–5.1)
Sodium: 135 mmol/L (ref 135–145)

## 2015-04-16 LAB — URINE MICROSCOPIC-ADD ON

## 2015-04-16 LAB — CBG MONITORING, ED: Glucose-Capillary: 142 mg/dL — ABNORMAL HIGH (ref 65–99)

## 2015-04-16 LAB — I-STAT TROPONIN, ED: Troponin i, poc: 0 ng/mL (ref 0.00–0.08)

## 2015-04-16 MED ORDER — GLUCOSE BLOOD VI STRP: ORAL_STRIP | Status: DC

## 2015-04-16 MED ORDER — SODIUM CHLORIDE 0.9 % IV BOLUS (SEPSIS)
500.0000 mL | Freq: Once | INTRAVENOUS | Status: AC
  Administered 2015-04-16: 500 mL via INTRAVENOUS

## 2015-04-16 MED ORDER — ONDANSETRON HCL 4 MG/2ML IJ SOLN: 4.0000 mg | Freq: Four times a day (QID) | INTRAMUSCULAR | Status: DC | PRN

## 2015-04-16 MED ORDER — INSULIN ASPART 100 UNIT/ML ~~LOC~~ SOLN: 0.0000 [IU] | Freq: Every day | SUBCUTANEOUS | Status: DC

## 2015-04-16 MED ORDER — INSULIN ASPART 100 UNIT/ML ~~LOC~~ SOLN
0.0000 [IU] | Freq: Three times a day (TID) | SUBCUTANEOUS | Status: DC
  Administered 2015-04-17: 3 [IU] via SUBCUTANEOUS
  Administered 2015-04-17: 2 [IU] via SUBCUTANEOUS

## 2015-04-16 MED ORDER — SODIUM CHLORIDE 0.9 % IJ SOLN
3.0000 mL | Freq: Two times a day (BID) | INTRAMUSCULAR | Status: DC
Start: 2015-04-16 — End: 2015-04-18
  Administered 2015-04-16 (×2): 3 mL via INTRAVENOUS

## 2015-04-16 MED ORDER — ONDANSETRON HCL 4 MG PO TABS: 4.0000 mg | ORAL_TABLET | Freq: Four times a day (QID) | ORAL | Status: DC | PRN

## 2015-04-16 MED ORDER — SODIUM CHLORIDE 0.9 % IV SOLN: INTRAVENOUS | Status: DC

## 2015-04-16 MED ORDER — CARVEDILOL 3.125 MG PO TABS
3.1250 mg | ORAL_TABLET | Freq: Two times a day (BID) | ORAL | Status: DC
  Administered 2015-04-17: 3.125 mg via ORAL
  Filled 2015-04-16: qty 1

## 2015-04-16 MED ORDER — HEPARIN SODIUM (PORCINE) 5000 UNIT/ML IJ SOLN
5000.0000 [IU] | Freq: Three times a day (TID) | INTRAMUSCULAR | Status: DC
  Administered 2015-04-16 – 2015-04-17 (×3): 5000 [IU] via SUBCUTANEOUS
  Filled 2015-04-16 (×3): qty 1

## 2015-04-16 MED ORDER — ACCU-CHEK SOFT TOUCH LANCETS MISC: Status: DC

## 2015-04-16 MED ORDER — ATORVASTATIN CALCIUM 10 MG PO TABS
20.0000 mg | ORAL_TABLET | Freq: Every day | ORAL | Status: DC
  Administered 2015-04-16 – 2015-04-17 (×2): 20 mg via ORAL
  Filled 2015-04-16 (×2): qty 2

## 2015-04-16 MED ORDER — SODIUM CHLORIDE 0.9 % IV SOLN: Freq: Once | INTRAVENOUS | Status: DC

## 2015-04-16 MED ORDER — ACETAMINOPHEN 325 MG PO TABS
650.0000 mg | ORAL_TABLET | Freq: Four times a day (QID) | ORAL | Status: DC | PRN
  Administered 2015-04-17: 650 mg via ORAL
  Filled 2015-04-16: qty 2

## 2015-04-16 MED ORDER — SENNOSIDES-DOCUSATE SODIUM 8.6-50 MG PO TABS: 1.0000 | ORAL_TABLET | Freq: Every evening | ORAL | Status: DC | PRN

## 2015-04-16 MED ORDER — ACETAMINOPHEN 650 MG RE SUPP: 650.0000 mg | Freq: Four times a day (QID) | RECTAL | Status: DC | PRN

## 2015-04-16 MED ORDER — ASPIRIN EC 81 MG PO TBEC
81.0000 mg | DELAYED_RELEASE_TABLET | Freq: Every day | ORAL | Status: DC
  Administered 2015-04-16 – 2015-04-17 (×2): 81 mg via ORAL
  Filled 2015-04-16 (×2): qty 1

## 2015-04-16 MED ORDER — FERROUS GLUCONATE 324 (38 FE) MG PO TABS
324.0000 mg | ORAL_TABLET | Freq: Every day | ORAL | Status: DC
  Administered 2015-04-16 – 2015-04-17 (×2): 324 mg via ORAL
  Filled 2015-04-16 (×2): qty 1

## 2015-04-16 MED ORDER — SODIUM CHLORIDE 0.9 % IV SOLN
INTRAVENOUS | Status: DC
  Administered 2015-04-16: 22:00:00 via INTRAVENOUS

## 2015-04-16 MED ORDER — SODIUM CHLORIDE 0.9 % IV BOLUS (SEPSIS): 1000.0000 mL | Freq: Once | INTRAVENOUS | Status: DC

## 2015-04-16 NOTE — ED Notes (Signed)
Info pt of urine sample. Pt have urinal
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
## 2015-04-16 NOTE — ED Notes (Signed)
US at bedside

## 2015-04-16 NOTE — ED Notes (Signed)
Patient states he had heartburn 3 days ago and has had dizziness since a change in his medication. Patient states he does not know what the name of the medication is, but states a BP med.

## 2015-04-16 NOTE — ED Notes (Signed)
Pt has urinal, states he will continue to attempt a urine sample.

## 2015-04-17 DIAGNOSIS — L8990 Pressure ulcer of unspecified site, unspecified stage: Secondary | ICD-10-CM | POA: Insufficient documentation

## 2015-04-17 DIAGNOSIS — E440 Moderate protein-calorie malnutrition: Secondary | ICD-10-CM | POA: Insufficient documentation

## 2015-04-17 DIAGNOSIS — N184 Chronic kidney disease, stage 4 (severe): Secondary | ICD-10-CM

## 2015-04-17 HISTORY — DX: Moderate protein-calorie malnutrition: E44.0

## 2015-04-17 LAB — GLUCOSE, CAPILLARY
Glucose-Capillary: 122 mg/dL — ABNORMAL HIGH (ref 65–99)
Glucose-Capillary: 133 mg/dL — ABNORMAL HIGH (ref 65–99)
Glucose-Capillary: 181 mg/dL — ABNORMAL HIGH (ref 65–99)
Glucose-Capillary: 140 mg/dL — ABNORMAL HIGH (ref 65–99)
Glucose-Capillary: 175 mg/dL — ABNORMAL HIGH (ref 65–99)

## 2015-04-17 LAB — COMPREHENSIVE METABOLIC PANEL
ALT: 14 U/L — ABNORMAL LOW (ref 17–63)
AST: 14 U/L — ABNORMAL LOW (ref 15–41)
Albumin: 3.1 g/dL — ABNORMAL LOW (ref 3.5–5.0)
Alkaline Phosphatase: 70 U/L (ref 38–126)
Anion gap: 11 (ref 5–15)
BUN: 75 mg/dL — ABNORMAL HIGH (ref 6–20)
CO2: 16 mmol/L — ABNORMAL LOW (ref 22–32)
Calcium: 8.9 mg/dL (ref 8.9–10.3)
Chloride: 110 mmol/L (ref 101–111)
Creatinine, Ser: 3.28 mg/dL — ABNORMAL HIGH (ref 0.61–1.24)
GFR calc Af Amer: 22 mL/min — ABNORMAL LOW (ref >60)
GFR calc non Af Amer: 19 mL/min — ABNORMAL LOW (ref >60)
Glucose, Bld: 174 mg/dL — ABNORMAL HIGH (ref 65–99)
Potassium: 3.5 mmol/L (ref 3.5–5.1)
Sodium: 137 mmol/L (ref 135–145)
Total Bilirubin: 0.4 mg/dL (ref 0.3–1.2)
Total Protein: 7.5 g/dL (ref 6.5–8.1)

## 2015-04-17 LAB — CBC
HCT: 28.6 % — ABNORMAL LOW (ref 39.0–52.0)
Hemoglobin: 9.5 g/dL — ABNORMAL LOW (ref 13.0–17.0)
MCH: 29.4 pg (ref 26.0–34.0)
MCHC: 33.2 g/dL (ref 30.0–36.0)
MCV: 88.5 fL (ref 78.0–100.0)
Platelets: 427 10*3/uL — ABNORMAL HIGH (ref 150–400)
RBC: 3.23 MIL/uL — ABNORMAL LOW (ref 4.22–5.81)
RDW: 13.1 % (ref 11.5–15.5)
WBC: 7.5 10*3/uL (ref 4.0–10.5)

## 2015-04-17 MED ORDER — GLUCERNA SHAKE PO LIQD
237.0000 mL | Freq: Two times a day (BID) | ORAL | Status: DC
  Filled 2015-04-17: qty 237

## 2015-04-17 MED ORDER — COLLAGENASE 250 UNIT/GM EX OINT
TOPICAL_OINTMENT | Freq: Every day | CUTANEOUS | Status: DC
  Administered 2015-04-17: 11:00:00 via TOPICAL
  Filled 2015-04-17: qty 30

## 2015-04-17 MED ORDER — GLIPIZIDE 10 MG PO TABS: 5.0000 mg | ORAL_TABLET | Freq: Two times a day (BID) | ORAL | Status: DC

## 2015-04-17 MED ORDER — PRO-STAT SUGAR FREE PO LIQD
30.0000 mL | Freq: Two times a day (BID) | ORAL | Status: DC
  Administered 2015-04-17: 30 mL via ORAL
  Filled 2015-04-17: qty 30

## 2015-04-17 MED ORDER — INSULIN GLARGINE 100 UNIT/ML ~~LOC~~ SOLN
10.0000 [IU] | Freq: Every day | SUBCUTANEOUS | Status: DC
  Filled 2015-04-17: qty 0.1

## 2015-04-17 NOTE — Consult Note (Signed)
WOC wound consult note Reason for Consult: Pressure injury left stump Pt is followed by Dr. Sharol Given as well for this ulcer.  Pt has had prosthesis for some time, he actually has had a new prothesis made due to the issues his current one is causing.  Pressure injury related to device.  Wound type: Medical device related pressure injury (MDRPI) Pressure Ulcer POA: Yes Measurement: 1.0cm x 1.5cm x 0.3cm  Wound bed:20% pink base, 80% yellow slough  Drainage (amount, consistency, odor) minimal, serous Periwound: intact  Dressing procedure/placement/frequency: Add enzymatic debridement ointment 1/4" thick layer, cover with dry dressing. Secure with tape. Change daily.   Discussed POC with patient and bedside nurse.  Re consult if needed, will not follow at this time. Thanks  [place] Spees [place], [place] [phone])

## 2015-04-17 NOTE — Progress Notes (Signed)
Initial Nutrition Assessment  DOCUMENTATION CODES:   Non-severe (moderate) malnutrition in context of chronic illness  INTERVENTION:  - Will order Glucerna Shake BID, each supplement provides 220 kcal and 10 grams of protein - Will order Prostat BID, each supplement provides 100 kcal and 15 grams of protein - RD will continue to monitor for needs  NUTRITION DIAGNOSIS:   Increased nutrient needs related to wound healing as evidenced by estimated needs.  GOAL:   Patient will meet greater than or equal to 90% of their needs  MONITOR:   PO intake, Supplement acceptance, Weight trends, Labs, I & O's  REASON FOR ASSESSMENT:   Malnutrition Screening Tool  ASSESSMENT:   64 y.o. male with past medical history of CKD stage 4, diabetes type 2, dyslipidemia, chronic diastolic CHF, hypertension who presented to [facility] ED with dizziness which has been going on for some time and per patient especially after he takes lasix. On this admission he was found to have acute worsening renal function (baseline Cr 1.6 and on this admission 4.43). He was admitted for further evaluation and management.   Pt seen for MST. BMI indicates normal weight status. Pt ate 100% dinner last night and 100% of breakfast this AM. He states that for breakfast he had toast, grits, and eggs. Pt reports that PTA he would typically snack throughout the day rather than eating planned meals. He states snack items consisted of protein items such as tuna salad, chicken breast, and salami. Talked with pt about high sodium-containing foods and need to limit them in his diet; pt verbalizes understanding.   He was not drinking nutrition supplements PTA. Talked with him about Glucerna Shake and he states "I will have to read about it first, I read about everything before I take it."   Moderate fat and mild muscle wasting noted. Per weight hx review, weight has been fluctuating. Pt states that he was on and off Lasix PTA; this was likely  attributing to weight fluctuations. Pt unsure of a UBW.   Likely meeting minimal kcal needs but not protein needs at this time. Medications reviewed. Labs reviewed; CBGs: 122-142 mg/dL, BUN/creatinine elevated and trending up, GFR: 22.   Diet Order:  Diet renal/carb modified with fluid restriction Diet-HS Snack?: Nothing; Room service appropriate?: Yes; Fluid consistency:: Thin  Skin:  Wound (see comment) (Stage 3 ulcer to L BKA stump)  Last BM:  PTA  Height:   Ht Readings from Last 1 Encounters:  04/17/15 6\' 2"  (1.88 m)    Weight:   Wt Readings from Last 1 Encounters:  04/17/15 181 lb (82.1 kg)    Ideal Body Weight:  86.36 kg (kg)  BMI:  Body mass index is 23.23 kg/(m^2).  Estimated Nutritional Needs:   Kcal:  2050-2250  Protein:  100-110 grams  Fluid:  1.7-2.1 L/day  EDUCATION NEEDS:   No education needs identified at this time     Jarome Matin, RD, LDN Inpatient Clinical Dietitian Pager # [phone] After hours/weekend pager # [phone]

## 2015-04-17 NOTE — Progress Notes (Addendum)
Patient ID: Mark [name], male   DOB: November 28, 1950, 64 y.o.   MRN: [id number] [facility] PROGRESS NOTE  RAFEEQ BACH [id number] DOB: 11/19/1950 DOA: 04/16/2015 PCP: Lorayne Marek, MD  Brief narrative:    64 y.o. male with past medical history of CKD stage 4, diabetes type 2, dyslipidemia, chronic diastolic CHF, hypertension who presented to [facility] ED with dizziness which has been going on for some time and per patient especially after he takes lasix. On this admission he was found to have acute worsening renal function (baseline Cr 1.6 and on this admission 4.43). He was admitted for further evaluation and management.   Anticipated discharge: 8/19 if renal function better.  Assessment/Plan:    Principal Problem: Acute-on-chronic kidney injury / CKD stage 4 - Baseline creatinine 1.6 in 09/2014 and on this admission 4.43, likely from lasix and lisinopril and both meds placed on hold - Creatinine improving since admission, 4.43 --> 3.28 - Follow up BMP tomorrow am  Active Problems: Dizziness - Stable BP - Improved since admission  S/P BKA (below knee amputation) - Stable - Seen by WOC, appreciate their assessment   Essential hypertension - Lasix and lisinopril on hold due to renal insufficiency - BP stable   Chronic combined systolic and diastolic congestive heart failure - Compensated  - Last 2 D ECHO in 09/2014 with EF of 45% and grade 2 diastolic dysfunction  - Continue coreg 3.125 mg PO BID - Lasix on hold due to renal insufficiency    Dyslipidemia - Continue statin therapy  Diabetes mellitus, type 2 with renal maifestations, controlled - Last A1c 02/2015 - 6.6 - Resume glipizide and Lantus 10 units at bedtime - Started SSI as well  Anemia of chronic disease - Secondary to CKD - Hemoglobin stable - No current indications for transfusion  Malnutrition of moderate degree  - Nutrition consulted     DVT Prophylaxis  - Heparin subQ ordered    Code  Status: Full.  Family Communication:  plan of care discussed with the patient Disposition Plan: Home likely 8/19 if renal function better.    IV access:  Peripheral IV  Procedures and diagnostic studies:    Dg Chest 2 View 04/16/2015   No acute cardiopulmonary process seen.   Electronically Signed   By: Garald Balding M.D.   On: 04/16/2015 19:05   US Renal 04/16/2015  No evidence of hydronephrosis or other significant abnormality.   Electronically Signed   By: Earle Gell M.D.   On: 04/16/2015 20:35   Medical Consultants:  None   Other Consultants:  None   IAnti-Infectives:   None    Leisa Lenz, MD  [facility] Pager [phone]  Time spent in minutes: 25 minutes  If 7PM-7AM, please contact night-coverage [email or web address] Password [email or web address] 04/17/2015, 12:04 PM   LOS: 1 day    HPI/Subjective: No acute overnight events. Patient reports feeling better.   Objective: Filed Vitals:   04/16/15 2100 04/17/15 0032 04/17/15 0503 04/17/15 0817  BP: 141/75  115/69 110/56  Pulse: 73  65 62  Temp: 98.1 F (36.7 C)  98 F (36.7 C)   TempSrc: Oral  Oral   Resp: 16  18 16   Height:  6\' 2"  (1.88 m)    Weight: 83.4 kg (183 lb 13.8 oz) 82.1 kg (181 lb)    SpO2: 96%  99% 100%    Intake/Output Summary (Last 24 hours) at 04/17/15 1204 Last data filed at 04/17/15 0846  Gross per 24 hour  Intake  867.5 ml  Output    900 ml  Net  -32.5 ml    Exam:   General:  Pt is alert, follows commands appropriately, not in acute distress  Cardiovascular: Regular rate and rhythm, S1/S2 (+)   Respiratory: Clear to auscultation bilaterally, no wheezing, no crackles, no rhonchi  Abdomen: Soft, non tender, non distended, bowel sounds present  Extremities: left BKA, pulses palpable   Neuro: Grossly nonfocal  Data Reviewed: Basic Metabolic Panel:  Recent Labs Lab 04/16/15 1409 04/17/15 0432  NA 135 137  K 4.1 3.5  CL 106 110  CO2 20* 16*  GLUCOSE 189* 174*  BUN 81* 75*   CREATININE 4.43* 3.28*  CALCIUM 9.4 8.9   Liver Function Tests:  Recent Labs Lab 04/17/15 0432  AST 14*  ALT 14*  ALKPHOS 70  BILITOT 0.4  PROT 7.5  ALBUMIN 3.1*   No results for input(s): LIPASE, AMYLASE in the last 168 hours. No results for input(s): AMMONIA in the last 168 hours. CBC:  Recent Labs Lab 04/16/15 1409 04/17/15 0432  WBC 7.4 7.5  HGB 10.4* 9.5*  HCT 31.6* 28.6*  MCV 88.8 88.5  PLT 479* 427*   Cardiac Enzymes: No results for input(s): CKTOTAL, CKMB, CKMBINDEX, TROPONINI in the last 168 hours. BNP: Invalid input(s): POCBNP CBG:  Recent Labs Lab 04/16/15 1745 04/16/15 2050 04/17/15 0720  GLUCAP 142* 122* 133*    No results found for this or any previous visit (from the past 240 hour(s)).   Scheduled Meds: . aspirin EC  81 mg Oral Daily  . atorvastatin  20 mg Oral Daily  . carvedilol  3.125 mg Oral BID WC  . collagenase   Topical Daily  . ferrous gluconate  324 mg Oral Daily  . heparin  5,000 Units Subcutaneous 3 times per day  . insulin aspart  0-15 Units Subcutaneous TID WC  . insulin aspart  0-5 Units Subcutaneous QHS   Continuous Infusions: . sodium chloride 75 mL/hr at 04/16/15 2158

## 2015-04-17 NOTE — Progress Notes (Signed)
Noticed pt's call bell ringing, entered room. Upon entry, pt was upset, stating that he had been asking for help for "an hour" and no one had been in room. Pt wanted to leave. Informed pt of the risks of leaving AMA; pt verbalized risks and still wanted to leave. IV removed,tele d/c'd; pt assisted into his clothing, and room checked to ensure that pt belongings were in belonging bag carried by pt. Pt refused to read or sign AMA form; pt refused assistance via wheelchair. Pt left floor by himself with belongings, prosthesis on LLE, and using pt's own cane.

## 2015-04-17 NOTE — Discharge Instructions (Signed)
Acute Kidney Injury Acute kidney injury is a disease in which there is sudden (acute) damage to the kidneys. The kidneys are 2 organs that lie on either side of the spine between the middle of the back and the front of the abdomen. The kidneys:  Remove wastes and extra water from the blood.   Produce important hormones. These help keep bones strong, regulate blood pressure, and help create red blood cells.   Balance the fluids and chemicals in the blood and tissues. A small amount of kidney damage may not cause problems, but a large amount of damage may make it difficult or impossible for the kidneys to work the way they should. Acute kidney injury may develop into long-lasting (chronic) kidney disease. It may also develop into a life-threatening disease called end-stage kidney disease. Acute kidney injury can get worse very quickly, so it should be treated right away. Early treatment may prevent other kidney diseases from developing.  CAUSES   A problem with blood flow to the kidneys. This may be caused by:   Blood loss.   Heart disease.   Severe burns.   Liver disease.  Direct damage to the kidneys. This may be caused by:  Some medicines.   A kidney infection.   Poisoning or consuming toxic substances.   A surgical wound.   A blow to the kidney area.   A problem with urine flow. This may be caused by:   Cancer.   Kidney stones.   An enlarged prostate. SYMPTOMS   Swelling (edema) of the legs, ankles, or feet.   Tiredness (lethargy).   Nausea or vomiting.   Confusion.   Problems with urination, such as:   Painful or burning feeling during urination.   Decreased urine production.   Frequent accidents in children who are potty trained.   Bloody urine.   Muscle twitches and cramps.   Shortness of breath.   Seizures.   Chest pain or pressure. Sometimes, no symptoms are present. DIAGNOSIS Acute kidney injury may be detected  and diagnosed by tests, including blood, urine, imaging, or kidney biopsy tests.  TREATMENT Treatment of acute kidney injury varies depending on the cause and severity of the kidney damage. In mild cases, no treatment may be needed. The kidneys may heal on their own. If acute kidney injury is more severe, your caregiver will treat the cause of the kidney damage, help the kidneys heal, and prevent complications from occurring. Severe cases may require a procedure to remove toxic wastes from the body (dialysis) or surgery to repair kidney damage. Surgery may involve:   Repair of a torn kidney.   Removal of an obstruction. Most of the time, you will need to stay overnight at the hospital.  HOME CARE INSTRUCTIONS:  Follow your prescribed diet.  Only take over-the-counter or prescription medicines as directed by your caregiver.  Do not take any new medicines (prescription, over-the-counter, or nutritional supplements) unless approved by your caregiver. Many medicines can worsen your kidney damage or need to have the dose adjusted.   Keep all follow-up appointments as directed by your caregiver.  Observe your condition to make sure you are healing as expected. SEEK IMMEDIATE MEDICAL CARE IF:  You are feeling ill or have severe pain in the back or side.   Your symptoms return or you have new symptoms.  You have any symptoms of end-stage kidney disease. These include:   Persistent itchiness.   Loss of appetite.   Headaches.   Abnormally dark   or light skin.  Numbness in the hands or feet.   Easy bruising.   Frequent hiccups.   Menstruation stops.   You have a fever.  You have increased urine production.  You have pain or bleeding when urinating. MAKE SURE YOU:   Understand these instructions.  Will watch your condition.  Will get help right away if you are not doing well or get worse Document Released: 03/01/2011 Document Revised: 12/11/2012 Document  Reviewed: 04/14/2012 Exit[employer] Patient Information 2015 [employer]. This information is not intended to replace advice given to you by your health care provider. Make sure you discuss any questions you have with your health care provider.  

## 2015-04-18 ENCOUNTER — Encounter (HOSPITAL_COMMUNITY): Payer: Self-pay | Admitting: Emergency Medicine

## 2015-04-18 ENCOUNTER — Inpatient Hospital Stay (HOSPITAL_COMMUNITY)
Admission: EM | Admit: 2015-04-18 | Discharge: 2015-04-19 | DRG: 683 | Disposition: A | Discharge status: Home / Self Care | Payer: Medicaid Other | Attending: Internal Medicine | Admitting: Internal Medicine

## 2015-04-18 DIAGNOSIS — Z7982 Long term (current) use of aspirin: Secondary | ICD-10-CM | POA: Diagnosis not present

## 2015-04-18 DIAGNOSIS — I10 Essential (primary) hypertension: Secondary | ICD-10-CM

## 2015-04-18 DIAGNOSIS — E1151 Type 2 diabetes mellitus with diabetic peripheral angiopathy without gangrene: Secondary | ICD-10-CM

## 2015-04-18 DIAGNOSIS — N179 Acute kidney failure, unspecified: Secondary | ICD-10-CM | POA: Diagnosis not present

## 2015-04-18 DIAGNOSIS — E1159 Type 2 diabetes mellitus with other circulatory complications: Secondary | ICD-10-CM | POA: Diagnosis present

## 2015-04-18 DIAGNOSIS — I129 Hypertensive chronic kidney disease with stage 1 through stage 4 chronic kidney disease, or unspecified chronic kidney disease: Secondary | ICD-10-CM | POA: Diagnosis present

## 2015-04-18 DIAGNOSIS — I5032 Chronic diastolic (congestive) heart failure: Secondary | ICD-10-CM | POA: Diagnosis present

## 2015-04-18 DIAGNOSIS — I5042 Chronic combined systolic (congestive) and diastolic (congestive) heart failure: Secondary | ICD-10-CM | POA: Diagnosis present

## 2015-04-18 DIAGNOSIS — Z809 Family history of malignant neoplasm, unspecified: Secondary | ICD-10-CM | POA: Diagnosis not present

## 2015-04-18 DIAGNOSIS — Z87891 Personal history of nicotine dependence: Secondary | ICD-10-CM | POA: Diagnosis not present

## 2015-04-18 DIAGNOSIS — I951 Orthostatic hypotension: Secondary | ICD-10-CM | POA: Diagnosis present

## 2015-04-18 DIAGNOSIS — E118 Type 2 diabetes mellitus with unspecified complications: Secondary | ICD-10-CM | POA: Diagnosis present

## 2015-04-18 DIAGNOSIS — Z79899 Other long term (current) drug therapy: Secondary | ICD-10-CM | POA: Diagnosis not present

## 2015-04-18 DIAGNOSIS — Z86718 Personal history of other venous thrombosis and embolism: Secondary | ICD-10-CM | POA: Diagnosis not present

## 2015-04-18 DIAGNOSIS — N189 Chronic kidney disease, unspecified: Secondary | ICD-10-CM | POA: Diagnosis present

## 2015-04-18 DIAGNOSIS — E860 Dehydration: Secondary | ICD-10-CM | POA: Diagnosis present

## 2015-04-18 DIAGNOSIS — N19 Unspecified kidney failure: Secondary | ICD-10-CM | POA: Diagnosis not present

## 2015-04-18 DIAGNOSIS — Z794 Long term (current) use of insulin: Secondary | ICD-10-CM | POA: Diagnosis not present

## 2015-04-18 DIAGNOSIS — Z89519 Acquired absence of unspecified leg below knee: Secondary | ICD-10-CM

## 2015-04-18 DIAGNOSIS — E1122 Type 2 diabetes mellitus with diabetic chronic kidney disease: Secondary | ICD-10-CM | POA: Diagnosis present

## 2015-04-18 DIAGNOSIS — Z833 Family history of diabetes mellitus: Secondary | ICD-10-CM

## 2015-04-18 DIAGNOSIS — R197 Diarrhea, unspecified: Secondary | ICD-10-CM | POA: Diagnosis present

## 2015-04-18 LAB — URINALYSIS, ROUTINE W REFLEX MICROSCOPIC
Bilirubin Urine: NEGATIVE
Glucose, UA: NEGATIVE mg/dL
Hgb urine dipstick: NEGATIVE
Ketones, ur: NEGATIVE mg/dL
Leukocytes, UA: NEGATIVE
Nitrite: NEGATIVE
Protein, ur: 100 mg/dL — AB
Specific Gravity, Urine: 1.016 (ref 1.005–1.030)
Urobilinogen, UA: 0.2 mg/dL (ref 0.0–1.0)
pH: 5 (ref 5.0–8.0)

## 2015-04-18 LAB — CBC WITH DIFFERENTIAL/PLATELET
Basophils Absolute: 0.1 10*3/uL (ref 0.0–0.1)
Basophils Relative: 1 % (ref 0–1)
Eosinophils Absolute: 0.4 10*3/uL (ref 0.0–0.7)
Eosinophils Relative: 5 % (ref 0–5)
HCT: 31.8 % — ABNORMAL LOW (ref 39.0–52.0)
Hemoglobin: 10.6 g/dL — ABNORMAL LOW (ref 13.0–17.0)
Lymphocytes Relative: 43 % (ref 12–46)
Lymphs Abs: 3.1 10*3/uL (ref 0.7–4.0)
MCH: 30 pg (ref 26.0–34.0)
MCHC: 33.3 g/dL (ref 30.0–36.0)
MCV: 90.1 fL (ref 78.0–100.0)
Monocytes Absolute: 0.6 10*3/uL (ref 0.1–1.0)
Monocytes Relative: 8 % (ref 3–12)
Neutro Abs: 3.1 10*3/uL (ref 1.7–7.7)
Neutrophils Relative %: 43 % (ref 43–77)
Platelets: 511 10*3/uL — ABNORMAL HIGH (ref 150–400)
RBC: 3.53 MIL/uL — ABNORMAL LOW (ref 4.22–5.81)
RDW: 13.5 % (ref 11.5–15.5)
WBC: 7.2 10*3/uL (ref 4.0–10.5)

## 2015-04-18 LAB — BASIC METABOLIC PANEL
Anion gap: 9 (ref 5–15)
BUN: 59 mg/dL — ABNORMAL HIGH (ref 6–20)
CO2: 17 mmol/L — ABNORMAL LOW (ref 22–32)
Calcium: 9.3 mg/dL (ref 8.9–10.3)
Chloride: 115 mmol/L — ABNORMAL HIGH (ref 101–111)
Creatinine, Ser: 2.09 mg/dL — ABNORMAL HIGH (ref 0.61–1.24)
GFR calc Af Amer: 37 mL/min — ABNORMAL LOW (ref >60)
GFR calc non Af Amer: 32 mL/min — ABNORMAL LOW (ref >60)
Glucose, Bld: 226 mg/dL — ABNORMAL HIGH (ref 65–99)
Potassium: 4.1 mmol/L (ref 3.5–5.1)
Sodium: 141 mmol/L (ref 135–145)

## 2015-04-18 LAB — URINE MICROSCOPIC-ADD ON

## 2015-04-18 LAB — GLUCOSE, CAPILLARY: Glucose-Capillary: 169 mg/dL — ABNORMAL HIGH (ref 65–99)

## 2015-04-18 MED ORDER — INSULIN ASPART 100 UNIT/ML ~~LOC~~ SOLN
0.0000 [IU] | Freq: Three times a day (TID) | SUBCUTANEOUS | Status: DC
  Administered 2015-04-19: 3 [IU] via SUBCUTANEOUS
  Administered 2015-04-19: 2 [IU] via SUBCUTANEOUS

## 2015-04-18 MED ORDER — SODIUM CHLORIDE 0.9 % IV SOLN
INTRAVENOUS | Status: AC
  Administered 2015-04-19: 06:00:00 via INTRAVENOUS

## 2015-04-18 MED ORDER — ONDANSETRON HCL 4 MG/2ML IJ SOLN: 4.0000 mg | Freq: Four times a day (QID) | INTRAMUSCULAR | Status: DC | PRN

## 2015-04-18 MED ORDER — SODIUM CHLORIDE 0.9 % IV SOLN: INTRAVENOUS | Status: DC

## 2015-04-18 MED ORDER — MORPHINE SULFATE (PF) 2 MG/ML IV SOLN: 2.0000 mg | INTRAVENOUS | Status: DC | PRN

## 2015-04-18 MED ORDER — SODIUM CHLORIDE 0.9 % IV BOLUS (SEPSIS)
1000.0000 mL | Freq: Once | INTRAVENOUS | Status: AC
Start: 2015-04-18 — End: 2015-04-18
  Administered 2015-04-18: 1000 mL via INTRAVENOUS

## 2015-04-18 MED ORDER — CARVEDILOL 3.125 MG PO TABS
3.1250 mg | ORAL_TABLET | Freq: Two times a day (BID) | ORAL | Status: DC
  Administered 2015-04-19: 3.125 mg via ORAL
  Filled 2015-04-18 (×3): qty 1

## 2015-04-18 MED ORDER — ONDANSETRON HCL 4 MG PO TABS: 4.0000 mg | ORAL_TABLET | Freq: Four times a day (QID) | ORAL | Status: DC | PRN

## 2015-04-18 MED ORDER — ACETAMINOPHEN 650 MG RE SUPP: 650.0000 mg | Freq: Four times a day (QID) | RECTAL | Status: DC | PRN

## 2015-04-18 MED ORDER — INSULIN ASPART 100 UNIT/ML ~~LOC~~ SOLN: 0.0000 [IU] | Freq: Every day | SUBCUTANEOUS | Status: DC

## 2015-04-18 MED ORDER — HEPARIN SODIUM (PORCINE) 5000 UNIT/ML IJ SOLN
5000.0000 [IU] | Freq: Three times a day (TID) | INTRAMUSCULAR | Status: DC
  Administered 2015-04-18 – 2015-04-19 (×2): 5000 [IU] via SUBCUTANEOUS
  Filled 2015-04-18 (×5): qty 1

## 2015-04-18 MED ORDER — ASPIRIN EC 81 MG PO TBEC
81.0000 mg | DELAYED_RELEASE_TABLET | Freq: Every day | ORAL | Status: DC
  Administered 2015-04-19: 81 mg via ORAL
  Filled 2015-04-18: qty 1

## 2015-04-18 MED ORDER — ACETAMINOPHEN 325 MG PO TABS: 650.0000 mg | ORAL_TABLET | Freq: Four times a day (QID) | ORAL | Status: DC | PRN

## 2015-04-18 MED ORDER — INSULIN GLARGINE 100 UNIT/ML ~~LOC~~ SOLN
10.0000 [IU] | Freq: Every day | SUBCUTANEOUS | Status: DC
  Administered 2015-04-18: 10 [IU] via SUBCUTANEOUS
  Filled 2015-04-18 (×2): qty 0.1

## 2015-04-18 MED ORDER — SODIUM CHLORIDE 0.9 % IV SOLN
INTRAVENOUS | Status: DC
  Administered 2015-04-18: 17:00:00 via INTRAVENOUS

## 2015-04-18 MED ORDER — GLIPIZIDE 5 MG PO TABS
5.0000 mg | ORAL_TABLET | Freq: Two times a day (BID) | ORAL | Status: DC
  Administered 2015-04-19: 5 mg via ORAL
  Filled 2015-04-18 (×3): qty 1
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
## 2015-04-19 DIAGNOSIS — N19 Unspecified kidney failure: Secondary | ICD-10-CM

## 2015-04-19 LAB — COMPREHENSIVE METABOLIC PANEL
ALT: 16 U/L — ABNORMAL LOW (ref 17–63)
AST: 18 U/L (ref 15–41)
Albumin: 3.1 g/dL — ABNORMAL LOW (ref 3.5–5.0)
Alkaline Phosphatase: 68 U/L (ref 38–126)
Anion gap: 3 — ABNORMAL LOW (ref 5–15)
BUN: 48 mg/dL — ABNORMAL HIGH (ref 6–20)
CO2: 21 mmol/L — ABNORMAL LOW (ref 22–32)
Calcium: 8.6 mg/dL — ABNORMAL LOW (ref 8.9–10.3)
Chloride: 116 mmol/L — ABNORMAL HIGH (ref 101–111)
Creatinine, Ser: 1.91 mg/dL — ABNORMAL HIGH (ref 0.61–1.24)
GFR calc Af Amer: 41 mL/min — ABNORMAL LOW (ref >60)
GFR calc non Af Amer: 36 mL/min — ABNORMAL LOW (ref >60)
Glucose, Bld: 150 mg/dL — ABNORMAL HIGH (ref 65–99)
Potassium: 3.8 mmol/L (ref 3.5–5.1)
Sodium: 140 mmol/L (ref 135–145)
Total Bilirubin: 0.3 mg/dL (ref 0.3–1.2)
Total Protein: 7.1 g/dL (ref 6.5–8.1)

## 2015-04-19 LAB — GLUCOSE, CAPILLARY
Glucose-Capillary: 133 mg/dL — ABNORMAL HIGH (ref 65–99)
Glucose-Capillary: 157 mg/dL — ABNORMAL HIGH (ref 65–99)

## 2015-04-19 LAB — CBC
HCT: 28.9 % — ABNORMAL LOW (ref 39.0–52.0)
Hemoglobin: 9.3 g/dL — ABNORMAL LOW (ref 13.0–17.0)
MCH: 28.8 pg (ref 26.0–34.0)
MCHC: 32.2 g/dL (ref 30.0–36.0)
MCV: 89.5 fL (ref 78.0–100.0)
Platelets: 449 10*3/uL — ABNORMAL HIGH (ref 150–400)
RBC: 3.23 MIL/uL — ABNORMAL LOW (ref 4.22–5.81)
RDW: 13.3 % (ref 11.5–15.5)
WBC: 6.7 10*3/uL (ref 4.0–10.5)

## 2015-04-19 LAB — C DIFFICILE QUICK SCREEN W PCR REFLEX
C Diff antigen: NEGATIVE
C Diff interpretation: NEGATIVE
C Diff toxin: NEGATIVE

## 2015-04-19 MED ORDER — LISINOPRIL 5 MG PO TABS: 5.0000 mg | ORAL_TABLET | Freq: Every day | ORAL | Status: DC

## 2015-04-19 MED ORDER — LOPERAMIDE HCL 2 MG PO CAPS
2.0000 mg | ORAL_CAPSULE | ORAL | Status: DC | PRN
  Administered 2015-04-19: 2 mg via ORAL
  Filled 2015-04-19: qty 1

## 2015-04-19 MED ORDER — CALCIUM CARBONATE ANTACID 500 MG PO CHEW
1.0000 | CHEWABLE_TABLET | Freq: Four times a day (QID) | ORAL | Status: DC | PRN
  Administered 2015-04-19: 200 mg via ORAL
  Filled 2015-04-19: qty 1

## 2015-04-19 MED ORDER — LOPERAMIDE HCL 2 MG PO CAPS: 2.0000 mg | ORAL_CAPSULE | ORAL | Status: DC | PRN

## 2015-04-19 NOTE — Discharge Summary (Signed)
Physician Discharge Summary  Mark [name] [id number]:[id number] DOB: 12/21/1950 DOA: 04/18/2015  PCP: Lorayne Marek, MD  Admit date: 04/18/2015 Discharge date: 04/19/2015  Time spent: 20 minutes  Recommendations for Outpatient Follow-up:  1. Follow up with PCP in 1-2 weeks 2. Please repeat renal panel in one week  Discharge Diagnoses:  Principal Problem:   Acute kidney injury Active Problems:   DM type 2 causing vascular disease   S/P BKA (below knee amputation)   Essential hypertension   Chronic combined systolic and diastolic congestive heart failure   Renal failure   ARF (acute renal failure)   Discharge Condition: Improved  Diet recommendation: Diabetic, heart healthy  There were no vitals filed for this visit.  History of present illness:  Please see admit h and p from 8/18 for details. Briefly, pt presented to ED after leaving hospital AMA. Pt was recently admitted for ARF possibly secondary to combination of lasix with ACEI. Pt was admitted for continued work up.  Hospital Course:  1. ARF 1. Recent admission for ARF thought to be contributed by combination lasix and ACEI 2. No evidence of volume overload 3. Cr has shown improvement since initial admission 4. Baseline Cr 1.6 5. Patient was continued on gentle hydration overnight with continued improvement in Cr 6. ACEI was held during this admit 2. DM2 1. Will continue SSI coverage 2. Cont lantus and glipizide per home regimen 3. S/p BKA 1. stable 4. HTN 1. BP stable, currently controlled 2. Cont coreg 3. Hold ACEI per above 5. Chronic combined systolic and diastolic CHF 1. No signs of volume overload 2. EF 45% with grade 2 diastolic dysfunction 6. DVT prophylaxis 1. Heparin subq while inpatient 7. Diarrhea 1. Pt noted to have multiple bouts of diarrhea overnight 2. Patient found to be cdiff neg 3. 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 Will start PRN imodium  Discharge Exam: Filed Vitals:   04/18/15 2133 04/19/15 0611 04/19/15 0800 04/19/15 1527  BP: 168/74 156/75  152/73  Pulse: 69 73  67  Temp: 98 F (36.7 C) 98 F (36.7 C)  98.6 F (37 C)  TempSrc: Oral Oral  Oral  Resp: _0 Height:   _1  (1.88 m)   SpO2: 100% 100%  100%    General: Awake, in nad Cardiovascular: regular, s1, s2 Respiratory: normal resp effort  Discharge Instructions     Medication List    TAKE these medications        ACCU-CHEK AVIVA PLUS W/DEVICE Kit  Use as prescribed TID before meals and QHS     accu-chek soft touch lancets  Use as instructed     aspirin 81 MG EC tablet  Take 1 tablet (81 mg total) by mouth daily.     atorvastatin 20 MG tablet  Commonly known as:  LIPITOR  Take 1 tablet (20 mg total) by mouth daily.     carvedilol 3.125 MG tablet  Commonly known as:  COREG  Take 1 tablet (3.125 mg total) by mouth 2 (two) times daily with a meal.     ferrous gluconate 324 MG tablet  Commonly known as:  FERGON  Take 1 tablet (324 mg total) by mouth 2 (two) times daily with a meal.     glipiZIDE 5 MG tablet  Commonly known as:  GLUCOTROL  Take 1 tablet (5 mg total) by mouth 2 (two) times daily before a  meal.     glucose blood test strip  Commonly known as:  ACCU-CHEK AVIVA  Use as instructed     Insulin Glargine 100 UNIT/ML Solostar Pen  Commonly known as:  LANTUS SOLOSTAR  Inject 10 Units into the skin daily at 10 pm.     INSULIN SYRINGE .5CC/30GX5/16" 30G X 5/16" 0.5 ML Misc  Check blood sugar TID & QHS     lisinopril 5 MG tablet  Commonly known as:  PRINIVIL,ZESTRIL  Take 1 tablet (5 mg total) by mouth daily.     loperamide 2 MG capsule  Commonly known as:  IMODIUM  Take 1 capsule (2 mg total) by mouth as needed for diarrhea or loose stools.       No Known Allergies Follow-up Information    Follow up with Follow up with your PCP in 1-2 weeks.      Follow up with [facility]    . Schedule an appointment as soon as possible for a visit in 1 week.   Why:  Hospital follow up   Contact information:   [address] [place] [address] [phone]       The results of significant diagnostics from this hospitalization (including imaging, microbiology, ancillary and laboratory) are listed below for reference.    Significant Diagnostic Studies: Dg Chest 2 View  04/16/2015   CLINICAL DATA:  Acute onset of dizziness and epigastric pain. Initial encounter.  EXAM: CHEST  2 VIEW  COMPARISON:  Chest radiograph and CTA of the chest performed 10/16/2014  FINDINGS: The lungs are well-aerated and clear. There is slight elevation of the left hemidiaphragm. There is no evidence of focal opacification, pleural effusion or pneumothorax.  The heart is normal in size; the mediastinal contour is within normal limits. No acute osseous abnormalities are seen.  IMPRESSION: No acute cardiopulmonary process seen.   Electronically Signed   By: Garald Balding M.D.   On: 04/16/2015 19:05   US Renal  04/16/2015   CLINICAL DATA:  Acute kidney injury.  EXAM: RENAL / URINARY TRACT ULTRASOUND COMPLETE  COMPARISON:  None.  FINDINGS: Right Kidney:  Length: 12.6 cm. Echogenicity within normal limits. No mass or hydronephrosis visualized.  Left Kidney:  Length: 13.0 cm. Echogenicity within normal limits. Subcapsular cyst noted in upper pole measuring 2.4 cm. No mass or hydronephrosis visualized.  Bladder:  Appears normal for degree of bladder distention.  IMPRESSION: No evidence of hydronephrosis or other significant abnormality.   Electronically Signed   By: Earle Gell M.D.   On: 04/16/2015 20:35    Microbiology: Recent Results (from the past 240 hour(s))  C difficile quick scan w PCR reflex     Status: None   Collection Time: 04/19/15  8:30 AM  Result Value Ref Range Status   C Diff antigen NEGATIVE NEGATIVE Final   C Diff toxin NEGATIVE NEGATIVE  Final   C Diff interpretation Negative for toxigenic C. difficile  Final     Labs: Basic Metabolic Panel:  Recent Labs Lab 04/16/15 1409 04/17/15 0432 04/18/15 1553 04/19/15 0555  NA 135 137 141 140  K 4.1 3.5 4.1 3.8  CL 106 110 115* 116*  CO2 20* 16* 17* 21*  GLUCOSE 189* 174* 226* 150*  BUN 81* 75* 59* 48*  CREATININE 4.43* 3.28* 2.09* 1.91*  CALCIUM 9.4 8.9 9.3 8.6*   Liver Function Tests:  Recent Labs Lab 04/17/15 0432 04/19/15 0555  AST 14* 18  ALT 14*  16*  ALKPHOS 70 68  BILITOT 0.4 0.3  PROT 7.5 7.1  ALBUMIN 3.1* 3.1*   No results for input(s): LIPASE, AMYLASE in the last 168 hours. No results for input(s): AMMONIA in the last 168 hours. CBC:  Recent Labs Lab 04/16/15 1409 04/17/15 0432 04/18/15 1553 04/19/15 0555  WBC 7.4 7.5 7.2 6.7  NEUTROABS  --   --  3.1  --   HGB 10.4* 9.5* 10.6* 9.3*  HCT 31.6* 28.6* 31.8* 28.9*  MCV 88.8 88.5 90.1 89.5  PLT 479* 427* 511* 449*   Cardiac Enzymes: No results for input(s): CKTOTAL, CKMB, CKMBINDEX, TROPONINI in the last 168 hours. BNP: BNP (last 3 results)  Recent Labs  10/16/14 1630 10/17/14 0405 04/16/15 1409  BNP 1260.2* 1175.6* 17.5    ProBNP (last 3 results) No results for input(s): PROBNP in the last 8760 hours.  CBG:  Recent Labs Lab 04/17/15 1623 04/17/15 2124 04/18/15 2158 04/19/15 0735 04/19/15 1145  GLUCAP 140* 175* 169* 133* 157*    Signed:  Zerenity Bowron K  [facility] 04/19/2015, 3:49 PM

## 2015-04-22 NOTE — Discharge Summary (Addendum)
Physician Discharge Summary  Mark [name] [id number] DOB: 1950/12/23 DOA: 04/16/2015  PCP: Lorayne Marek, MD  Admit date: 04/16/2015 Discharge date: 04/22/2015  Recommendations for Outpatient Follow-up:  1. Pt left AMA  Discharge Diagnoses:  Principal Problem:   Acute-on-chronic kidney injury Active Problems:   Anemia   S/P BKA (below knee amputation)   Essential hypertension   Chronic combined systolic and diastolic congestive heart failure   Diabetes mellitus, type 2   Pressure ulcer   Malnutrition of moderate degree    Discharge Condition: left AMA  Diet recommendation: as tolerated   History of present illness:  64 y.o. male with past medical history of CKD stage 4, diabetes type 2, dyslipidemia, chronic diastolic CHF, hypertension who presented to [facility] ED with dizziness which has been going on for some time and per patient especially after he takes lasix. On this admission he was found to have acute worsening renal function (baseline Cr 1.6 and on this admission 4.43). He was admitted for further evaluation and management.    Hospital Course:  Assessment/Plan:    Principal Problem: Acute-on-chronic kidney injury / CKD stage 4 - Baseline creatinine 1.6 in 09/2014 and on this admission 4.43, likely from lasix and lisinopril and both meds placed on hold - Creatinine improved with IV fluids   Active Problems: Dizziness - Improved   S/P BKA (below knee amputation) - Stable - Seen by [facility], appreciate their assessment   Essential hypertension - Lasix and lisinopril on hold due to renal insufficiency - BP at goal  Chronic combined systolic and diastolic congestive heart failure - Compensated  - Last 2 D ECHO in 09/2014 with EF of 45% and grade 2 diastolic dysfunction  - Continue coreg 3.125 mg PO BID - Lasix was placed on hold due to renal insufficiency   Dyslipidemia - Continue statin therapy  Diabetes mellitus, type 2 with renal maifestations,  controlled - Last A1c 02/2015 - 6.6 - Resumed glipizide and Lantus 10 units at bedtime  Anemia of chronic disease - Secondary to CKD - Hemoglobin stable  Malnutrition of moderate degree  - Nutrition consulted     DVT Prophylaxis  - Heparin subQ ordered while pt in hospital    Code Status: Full.    IV access:  Peripheral IV  Procedures and diagnostic studies:   Dg Chest 2 View 04/16/2015 No acute cardiopulmonary process seen. Electronically Signed By: Garald Balding M.D. On: 04/16/2015 19:05   US Renal 04/16/2015 No evidence of hydronephrosis or other significant abnormality. Electronically Signed By: Earle Gell M.D. On: 04/16/2015 20:35   Medical Consultants:  None   Other Consultants:  None   IAnti-Infectives:   None       Signed:  Leisa Lenz, MD  [facility] 04/22/2015, 9:54 PM  Pager #: [phone]   Discharge Exam: Filed Vitals:   04/17/15 2120  BP: 152/81  Pulse: 64  Temp: 97.9 F (36.6 C)  Resp: 18   Filed Vitals:   04/17/15 0503 04/17/15 0817 04/17/15 1400 04/17/15 2120  BP: 115/69 110/56 116/68 152/81  Pulse: 65 62 65 64  Temp: 98 F (36.7 C)  98.2 F (36.8 C) 97.9 F (36.6 C)  TempSrc: Oral  Oral Oral  Resp: _0 Height:      Weight:      SpO2: 99% 100% 100% 100%    Physical exam: not done since pt left AMA  Discharge Instructions     Medication List    STOP taking  these medications        furosemide 40 MG tablet  Commonly known as:  LASIX      TAKE these medications        ACCU-CHEK AVIVA PLUS W/DEVICE Kit  Use as prescribed TID before meals and QHS     accu-chek soft touch lancets  Use as instructed     aspirin 81 MG EC tablet  Take 1 tablet (81 mg total) by mouth daily.     atorvastatin 20 MG tablet  Commonly known as:  LIPITOR  Take 1 tablet (20 mg total) by mouth daily.     carvedilol 3.125 MG tablet  Commonly known as:  COREG  Take 1 tablet (3.125 mg  total) by mouth 2 (two) times daily with a meal.     ferrous gluconate 324 MG tablet  Commonly known as:  FERGON  Take 1 tablet (324 mg total) by mouth 2 (two) times daily with a meal.     glipiZIDE 5 MG tablet  Commonly known as:  GLUCOTROL  Take 1 tablet (5 mg total) by mouth 2 (two) times daily before a meal.     glucose blood test strip  Commonly known as:  ACCU-CHEK AVIVA  Use as instructed     Insulin Glargine 100 UNIT/ML Solostar Pen  Commonly known as:  LANTUS SOLOSTAR  Inject 10 Units into the skin daily at 10 pm.     INSULIN SYRINGE .5CC/30GX5/16" 30G X 5/16" 0.5 ML Misc  Check blood sugar TID & QHS          The results of significant diagnostics from this hospitalization (including imaging, microbiology, ancillary and laboratory) are listed below for reference.    Significant Diagnostic Studies: Dg Chest 2 View  04/16/2015   CLINICAL DATA:  Acute onset of dizziness and epigastric pain. Initial encounter.  EXAM: CHEST  2 VIEW  COMPARISON:  Chest radiograph and CTA of the chest performed 10/16/2014  FINDINGS: The lungs are well-aerated and clear. There is slight elevation of the left hemidiaphragm. There is no evidence of focal opacification, pleural effusion or pneumothorax.  The heart is normal in size; the mediastinal contour is within normal limits. No acute osseous abnormalities are seen.  IMPRESSION: No acute cardiopulmonary process seen.   Electronically Signed   By: Garald Balding M.D.   On: 04/16/2015 19:05   US Renal  04/16/2015   CLINICAL DATA:  Acute kidney injury.  EXAM: RENAL / URINARY TRACT ULTRASOUND COMPLETE  COMPARISON:  None.  FINDINGS: Right Kidney:  Length: 12.6 cm. Echogenicity within normal limits. No mass or hydronephrosis visualized.  Left Kidney:  Length: 13.0 cm. Echogenicity within normal limits. Subcapsular cyst noted in upper pole measuring 2.4 cm. No mass or hydronephrosis visualized.  Bladder:  Appears normal for degree of bladder distention.   IMPRESSION: No evidence of hydronephrosis or other significant abnormality.   Electronically Signed   By: Earle Gell M.D.   On: 04/16/2015 20:35    Microbiology: Recent Results (from the past 240 hour(s))  C difficile quick scan w PCR reflex     Status: None   Collection Time: 04/19/15  8:30 AM  Result Value Ref Range Status   C Diff antigen NEGATIVE NEGATIVE Final   C Diff toxin NEGATIVE NEGATIVE Final   C Diff interpretation Negative for toxigenic C. difficile  Final     Labs: Basic Metabolic Panel:  Recent Labs Lab 04/16/15 1409 04/17/15 0432 04/18/15 1553 04/19/15 0555  NA 135 137  141 140  K 4.1 3.5 4.1 3.8  CL 106 110 115* 116*  CO2 20* 16* 17* 21*  GLUCOSE 189* 174* 226* 150*  BUN 81* 75* 59* 48*  CREATININE 4.43* 3.28* 2.09* 1.91*  CALCIUM 9.4 8.9 9.3 8.6*   Liver Function Tests:  Recent Labs Lab 04/17/15 0432 04/19/15 0555  AST 14* 18  ALT 14* 16*  ALKPHOS 70 68  BILITOT 0.4 0.3  PROT 7.5 7.1  ALBUMIN 3.1* 3.1*   No results for input(s): LIPASE, AMYLASE in the last 168 hours. No results for input(s): AMMONIA in the last 168 hours. CBC:  Recent Labs Lab 04/16/15 1409 04/17/15 0432 04/18/15 1553 04/19/15 0555  WBC 7.4 7.5 7.2 6.7  NEUTROABS  --   --  3.1  --   HGB 10.4* 9.5* 10.6* 9.3*  HCT 31.6* 28.6* 31.8* 28.9*  MCV 88.8 88.5 90.1 89.5  PLT 479* 427* 511* 449*   Cardiac Enzymes: No results for input(s): CKTOTAL, CKMB, CKMBINDEX, TROPONINI in the last 168 hours. BNP: BNP (last 3 results)  Recent Labs  10/16/14 1630 10/17/14 0405 04/16/15 1409  BNP 1260.2* 1175.6* 17.5    ProBNP (last 3 results) No results for input(s): PROBNP in the last 8760 hours.  CBG:  Recent Labs Lab 04/17/15 1623 04/17/15 2124 04/18/15 2158 04/19/15 0735 04/19/15 1145  GLUCAP 140* 175* 169* 133* 157*

## 2015-04-30 ENCOUNTER — Encounter: Payer: Self-pay | Admitting: Family Medicine

## 2015-04-30 ENCOUNTER — Ambulatory Visit: Payer: Medicaid Other | Attending: Family Medicine | Admitting: Family Medicine

## 2015-04-30 VITALS — BP 118/68 | HR 55 | Temp 98.7°F | Resp 16 | Ht 74.0 in | Wt 195.0 lb

## 2015-04-30 DIAGNOSIS — B351 Tinea unguium: Secondary | ICD-10-CM | POA: Diagnosis not present

## 2015-04-30 DIAGNOSIS — Z87891 Personal history of nicotine dependence: Secondary | ICD-10-CM | POA: Insufficient documentation

## 2015-04-30 DIAGNOSIS — Z89512 Acquired absence of left leg below knee: Secondary | ICD-10-CM | POA: Diagnosis not present

## 2015-04-30 DIAGNOSIS — B353 Tinea pedis: Secondary | ICD-10-CM | POA: Insufficient documentation

## 2015-04-30 DIAGNOSIS — I509 Heart failure, unspecified: Secondary | ICD-10-CM | POA: Insufficient documentation

## 2015-04-30 DIAGNOSIS — E1151 Type 2 diabetes mellitus with diabetic peripheral angiopathy without gangrene: Secondary | ICD-10-CM

## 2015-04-30 DIAGNOSIS — I10 Essential (primary) hypertension: Secondary | ICD-10-CM | POA: Insufficient documentation

## 2015-04-30 DIAGNOSIS — E1159 Type 2 diabetes mellitus with other circulatory complications: Secondary | ICD-10-CM | POA: Insufficient documentation

## 2015-04-30 DIAGNOSIS — K219 Gastro-esophageal reflux disease without esophagitis: Secondary | ICD-10-CM | POA: Diagnosis not present

## 2015-04-30 DIAGNOSIS — N189 Chronic kidney disease, unspecified: Secondary | ICD-10-CM

## 2015-04-30 DIAGNOSIS — N179 Acute kidney failure, unspecified: Secondary | ICD-10-CM | POA: Insufficient documentation

## 2015-04-30 HISTORY — DX: Tinea unguium: B35.1

## 2015-04-30 LAB — BASIC METABOLIC PANEL
BUN: 32 mg/dL — ABNORMAL HIGH (ref 7–25)
CO2: 20 mmol/L (ref 20–31)
Calcium: 9.1 mg/dL (ref 8.6–10.3)
Chloride: 110 mmol/L (ref 98–110)
Creat: 1.89 mg/dL — ABNORMAL HIGH (ref 0.70–1.25)
Glucose, Bld: 121 mg/dL — ABNORMAL HIGH (ref 65–99)
Potassium: 4.2 mmol/L (ref 3.5–5.3)
Sodium: 140 mmol/L (ref 135–146)

## 2015-04-30 LAB — GLUCOSE, POCT (MANUAL RESULT ENTRY): POC Glucose: 119 mg/dl — AB (ref 70–99)

## 2015-04-30 MED ORDER — TERBINAFINE HCL 250 MG PO TABS: 250.0000 mg | ORAL_TABLET | Freq: Every day | ORAL | Status: DC

## 2015-04-30 MED ORDER — OMEPRAZOLE 20 MG PO CPDR: 20.0000 mg | DELAYED_RELEASE_CAPSULE | Freq: Every day | ORAL | Status: DC

## 2015-04-30 NOTE — Patient Instructions (Addendum)
Mr. Hood, [place] was seen today for hospitalization follow-up and establish care.  Diagnoses and all orders for this visit:  DM type 2 causing vascular disease -     POCT glucose (manual entry)  Status post below knee amputation of left lower extremity  Gastroesophageal reflux disease, esophagitis presence not specified -     omeprazole (PRILOSEC) 20 MG capsule; Take 1 capsule (20 mg total) by mouth daily.  Tinea pedis of right foot  Onychomycosis of toenail -     terbinafine (LAMISIL) 250 MG tablet; Take 1 tablet (250 mg total) by mouth daily.  Acute-on-chronic kidney injury -     Basic Metabolic Panel   You will be called with lab results  F/u in September for flu shot F/u with me in 2 months for diabetes   Dr. Adrian Blackwater

## 2015-04-30 NOTE — Progress Notes (Signed)
Establish care with new PCP HFU kidney failure, state possible due to Lipitor  No hx tobacco

## 2015-04-30 NOTE — Progress Notes (Signed)
   Subjective:    Patient ID: Mark [name], male    DOB: Jul 04, 1951, 64 y.o.   MRN: [id number] CC: HFU kidney failure  HPI 64 yo M with DM and HTN, CHF    1.  AKI: due to lasix and ACEi. Patient holding ACEi. He feels dizzy when he takes ACEi. No swelling. No cough. No change in urination.   2. GERD: gets frequent heartburn. tums helps minimally. No DOE or CP with exertion. No weight loss.   Social History  Substance Use Topics  . Smoking status: Former Smoker    Quit date: 08/03/2013  . Smokeless tobacco: Never Used     Comment: 3 cigarettes/day  . Alcohol Use: No     Comment: has quit all alcohol   Review of Systems  Constitutional: Negative for fever, chills, fatigue and unexpected weight change.  Eyes: Negative for visual disturbance.  Respiratory: Negative for cough and shortness of breath.   Cardiovascular: Negative for chest pain, palpitations and leg swelling.  Gastrointestinal: Negative for nausea, vomiting, abdominal pain, diarrhea, constipation and blood in stool.  Endocrine: Negative for polydipsia, polyphagia and polyuria.  Musculoskeletal: Negative for myalgias, back pain, arthralgias, gait problem and neck pain.  Skin: Negative for rash.  Allergic/Immunologic: Negative for immunocompromised state.  Hematological: Negative for adenopathy. Does not bruise/bleed easily.  Psychiatric/Behavioral: Negative for suicidal ideas, sleep disturbance and dysphoric mood. The patient is not nervous/anxious.        Objective:   Physical Exam  Constitutional: He appears well-developed and well-nourished. No distress.  Neck: Normal range of motion. Neck supple.  Cardiovascular: Normal rate, regular rhythm, normal heart sounds and intact distal pulses.   Pulmonary/Chest: Effort normal and breath sounds normal.  Musculoskeletal: Normal range of motion. He exhibits no edema or tenderness.  L BKA  Neurological: He is alert.  Skin: Skin is warm and dry. No rash noted. No  erythema.     Psychiatric: He has a normal mood and affect.  BP 118/68 mmHg  Pulse 55  Temp(Src) 98.7 F (37.1 C) (Oral)  Resp 16  Ht 6\' 2"  (1.88 m)  Wt 195 lb (88.451 kg)  BMI 25.03 kg/m2  SpO2 100%  Wt Readings from Last 3 Encounters:  04/30/15 195 lb (88.451 kg)  04/17/15 181 lb (82.1 kg)  03/11/15 205 lb 9.6 oz (93.26 kg)    Lab Results  Component Value Date   CREATININE 1.91* 04/19/2015   CREATININE 2.09* 04/18/2015   CREATININE 3.28* 04/17/2015     Lab Results  Component Value Date   HGBA1C 6.60 03/11/2015   CBG 119    Assessment & Plan:  Mark [name] was seen today for hospitalization follow-up and establish care.  Diagnoses and all orders for this visit:  DM type 2 causing vascular disease -     POCT glucose (manual entry)  Status post below knee amputation of left lower extremity  Gastroesophageal reflux disease, esophagitis presence not specified -     omeprazole (PRILOSEC) 20 MG capsule; Take 1 capsule (20 mg total) by mouth daily.  Tinea pedis of right foot -     terbinafine (LAMISIL) 250 MG tablet; Take 1 tablet (250 mg total) by mouth daily.  Onychomycosis of toenail -     terbinafine (LAMISIL) 250 MG tablet; Take 1 tablet (250 mg total) by mouth daily.  Acute-on-chronic kidney injury -     Basic Metabolic Panel

## 2015-05-13 ENCOUNTER — Telehealth: Payer: Self-pay | Admitting: *Deleted

## 2015-05-13 NOTE — Telephone Encounter (Signed)
-----   Message from Boykin Nearing, MD sent at 05/01/2015  9:56 AM EDT ----- Improved BUN/Cr Continue current treatment plan Will repeat labs at f/u visit

## 2015-05-13 NOTE — Telephone Encounter (Signed)
Unable to contact pt  Voice mail not set up yet

## 2015-06-09 ENCOUNTER — Telehealth: Payer: Self-pay | Admitting: General Practice

## 2015-06-09 NOTE — Telephone Encounter (Signed)
Patient presents to clinic to request medication refill for the following medications:  Atorvastatin, Furosemide,  Carvedilol  Please assist.

## 2015-06-10 ENCOUNTER — Other Ambulatory Visit: Payer: Self-pay | Admitting: *Deleted

## 2015-06-10 DIAGNOSIS — I5042 Chronic combined systolic (congestive) and diastolic (congestive) heart failure: Secondary | ICD-10-CM

## 2015-06-10 DIAGNOSIS — E139 Other specified diabetes mellitus without complications: Secondary | ICD-10-CM

## 2015-06-10 MED ORDER — GLIPIZIDE 5 MG PO TABS: 5.0000 mg | ORAL_TABLET | Freq: Two times a day (BID) | ORAL | Status: DC

## 2015-06-10 MED ORDER — ATORVASTATIN CALCIUM 20 MG PO TABS: 20.0000 mg | ORAL_TABLET | Freq: Every day | ORAL | Status: DC

## 2015-06-10 MED ORDER — CARVEDILOL 3.125 MG PO TABS: 3.1250 mg | ORAL_TABLET | Freq: Two times a day (BID) | ORAL | Status: DC

## 2015-06-10 NOTE — Telephone Encounter (Signed)
Rx refills send to [facility]

## 2015-07-04 ENCOUNTER — Telehealth: Payer: Self-pay | Admitting: Family Medicine

## 2015-07-04 DIAGNOSIS — E119 Type 2 diabetes mellitus without complications: Secondary | ICD-10-CM

## 2015-07-04 NOTE — Telephone Encounter (Signed)
Novolog long acting

## 2015-07-04 NOTE — Telephone Encounter (Signed)
Patient is requesting a refill for Novolog long-acting. Please follow up with pt. Thank you.

## 2015-07-09 ENCOUNTER — Other Ambulatory Visit: Payer: Self-pay

## 2015-07-09 MED ORDER — LISINOPRIL 5 MG PO TABS: 5.0000 mg | ORAL_TABLET | Freq: Every day | ORAL | Status: DC

## 2015-07-15 NOTE — Telephone Encounter (Signed)
Please inform patient novolog is not on active med list Has he been using novolog, if so what dose? Any low sugars?  He is due for diabetes f/u and schedule OV.

## 2015-07-16 ENCOUNTER — Other Ambulatory Visit: Payer: Self-pay | Admitting: *Deleted

## 2015-07-16 DIAGNOSIS — E119 Type 2 diabetes mellitus without complications: Secondary | ICD-10-CM

## 2015-07-16 MED ORDER — INSULIN GLARGINE 100 UNIT/ML SOLOSTAR PEN: 10.0000 [IU] | PEN_INJECTOR | Freq: Every day | SUBCUTANEOUS | Status: DC

## 2015-07-16 NOTE — Telephone Encounter (Signed)
Patient clarified today that he was needing Insulin Glargine (LANTUS SOLOSTAR) 100 UNIT/ML Solostar Pen. I will go ahead and schedule his follow up appt. Please follow up with pt. Patient mentioned his sugars are high.

## 2015-07-16 NOTE — Telephone Encounter (Signed)
Rx refill

## 2015-07-30 ENCOUNTER — Other Ambulatory Visit: Payer: Self-pay | Admitting: Family Medicine

## 2015-08-01 ENCOUNTER — Other Ambulatory Visit: Payer: Self-pay | Admitting: Family Medicine

## 2015-08-04 ENCOUNTER — Telehealth: Payer: Self-pay

## 2015-08-04 NOTE — Telephone Encounter (Signed)
Received refill request for furosemide. Furosemide not on current medication list. Nurse cannot refill medication. Nurse sent message to provider.

## 2015-08-06 NOTE — Telephone Encounter (Signed)
Please inform patient that lasix was stopped due to acute kidney injury in 03/2015 attributed to volume depletion He will need OV to assess volume status prior to lasix refill as he is at risk for renal damage when lasix is combined with his lisinopril.

## 2015-08-12 NOTE — Telephone Encounter (Signed)
Nurse called patient, patient verified date of birth. Patient aware of lasix being stopped due to acute kidney injury in 03/2015 attributed to volume depletion. Patient has OV 04/15/15 at 11am and confirms he is coming to that appointment.  Patient does not want to restart lasix.  Patient aware of being at risk for kidney damage when lasix is combined with lisinopril.  Patient voices understanding and has no further questions at this time.

## 2015-08-15 ENCOUNTER — Encounter: Payer: Self-pay | Admitting: Family Medicine

## 2015-08-15 ENCOUNTER — Ambulatory Visit: Payer: Medicaid Other | Attending: Family Medicine | Admitting: Family Medicine

## 2015-08-15 VITALS — BP 178/91 | HR 78 | Temp 98.8°F | Resp 16 | Ht 74.0 in | Wt 220.0 lb

## 2015-08-15 DIAGNOSIS — Z87891 Personal history of nicotine dependence: Secondary | ICD-10-CM | POA: Insufficient documentation

## 2015-08-15 DIAGNOSIS — B351 Tinea unguium: Secondary | ICD-10-CM | POA: Insufficient documentation

## 2015-08-15 DIAGNOSIS — Z7982 Long term (current) use of aspirin: Secondary | ICD-10-CM | POA: Diagnosis not present

## 2015-08-15 DIAGNOSIS — Z79899 Other long term (current) drug therapy: Secondary | ICD-10-CM | POA: Insufficient documentation

## 2015-08-15 DIAGNOSIS — Z794 Long term (current) use of insulin: Secondary | ICD-10-CM | POA: Diagnosis not present

## 2015-08-15 DIAGNOSIS — E118 Type 2 diabetes mellitus with unspecified complications: Secondary | ICD-10-CM | POA: Insufficient documentation

## 2015-08-15 DIAGNOSIS — Z1159 Encounter for screening for other viral diseases: Secondary | ICD-10-CM | POA: Diagnosis not present

## 2015-08-15 DIAGNOSIS — I10 Essential (primary) hypertension: Secondary | ICD-10-CM

## 2015-08-15 DIAGNOSIS — Z0000 Encounter for general adult medical examination without abnormal findings: Secondary | ICD-10-CM | POA: Insufficient documentation

## 2015-08-15 DIAGNOSIS — R809 Proteinuria, unspecified: Secondary | ICD-10-CM | POA: Insufficient documentation

## 2015-08-15 DIAGNOSIS — N183 Chronic kidney disease, stage 3 unspecified: Secondary | ICD-10-CM

## 2015-08-15 LAB — COMPLETE METABOLIC PANEL WITH GFR
ALT: 14 U/L (ref 9–46)
AST: 17 U/L (ref 10–35)
Albumin: 3 g/dL — ABNORMAL LOW (ref 3.6–5.1)
Alkaline Phosphatase: 73 U/L (ref 40–115)
BUN: 20 mg/dL (ref 7–25)
CO2: 29 mmol/L (ref 20–31)
Calcium: 8.8 mg/dL (ref 8.6–10.3)
Chloride: 107 mmol/L (ref 98–110)
Creat: 1.44 mg/dL — ABNORMAL HIGH (ref 0.70–1.25)
GFR, Est African American: 59 mL/min — ABNORMAL LOW (ref >=60)
GFR, Est Non African American: 51 mL/min — ABNORMAL LOW (ref >=60)
Glucose, Bld: 144 mg/dL — ABNORMAL HIGH (ref 65–99)
Potassium: 3.8 mmol/L (ref 3.5–5.3)
Sodium: 144 mmol/L (ref 135–146)
Total Bilirubin: 0.3 mg/dL (ref 0.2–1.2)
Total Protein: 6.6 g/dL (ref 6.1–8.1)

## 2015-08-15 LAB — POCT GLYCOSYLATED HEMOGLOBIN (HGB A1C): Hemoglobin A1C: 7.9

## 2015-08-15 LAB — GLUCOSE, POCT (MANUAL RESULT ENTRY): POC Glucose: 151 mg/dl — AB (ref 70–99)

## 2015-08-15 MED ORDER — LISINOPRIL 5 MG PO TABS: 5.0000 mg | ORAL_TABLET | Freq: Every day | ORAL | Status: DC

## 2015-08-15 MED ORDER — INSULIN GLARGINE 100 UNIT/ML SOLOSTAR PEN: 10.0000 [IU] | PEN_INJECTOR | Freq: Every day | SUBCUTANEOUS | Status: DC

## 2015-08-15 MED ORDER — CARVEDILOL 3.125 MG PO TABS: 3.1250 mg | ORAL_TABLET | Freq: Two times a day (BID) | ORAL | Status: DC

## 2015-08-15 MED ORDER — GLIPIZIDE 10 MG PO TABS: 10.0000 mg | ORAL_TABLET | Freq: Two times a day (BID) | ORAL | Status: DC

## 2015-08-15 MED ORDER — TERBINAFINE HCL 250 MG PO TABS: 250.0000 mg | ORAL_TABLET | Freq: Every day | ORAL | Status: DC

## 2015-08-15 NOTE — Assessment & Plan Note (Signed)
A; elevated BP today, no meds today P: Continue current regimen Close f.u for recheck

## 2015-08-15 NOTE — Patient Instructions (Addendum)
Lan was seen today for diabetes.  Diagnoses and all orders for this visit:  Type 2 diabetes mellitus with complication, unspecified long term insulin use status (HCC) -     POCT glycosylated hemoglobin (Hb A1C) -     POCT glucose (manual entry) -     Microalbumin/Creatinine Ratio, Urine -     Insulin Glargine (LANTUS SOLOSTAR) 100 UNIT/ML Solostar Pen; Inject 10 Units into the skin daily at 10 pm. -     glipiZIDE (GLUCOTROL) 10 MG tablet; Take 1 tablet (10 mg total) by mouth 2 (two) times daily before a meal. -     Ambulatory referral to Ophthalmology -     COMPLETE METABOLIC PANEL WITH GFR  Healthcare maintenance -     Flu Vaccine QUAD 36+ mos IM -     Ambulatory referral to Gastroenterology  Need for hepatitis C screening test -     Hepatitis C antibody, reflex  Essential hypertension -     carvedilol (COREG) 3.125 MG tablet; Take 1 tablet (3.125 mg total) by mouth 2 (two) times daily with a meal. -     lisinopril (PRINIVIL,ZESTRIL) 5 MG tablet; Take 1 tablet (5 mg total) by mouth daily.  Onychomycosis of toenail -     terbinafine (LAMISIL) 250 MG tablet; Take 1 tablet (250 mg total) by mouth daily. For 12 weeks   F/u in 3 weeks for BP check with pharmacist  F/u in 3 months for diabetes and HTN with me    Dr. Adrian Blackwater

## 2015-08-15 NOTE — Assessment & Plan Note (Signed)
lamisil x 12 weeks Repeat CMP in 6 weeks

## 2015-08-15 NOTE — Progress Notes (Signed)
F/U DM  Stated no Inulin x 1 month  No pain today  No tobacco user  No suicide thought in the past two weeks

## 2015-08-15 NOTE — Progress Notes (Signed)
Patient ID: Mark [name], male   DOB: 07/01/1951, 64 y.o.   MRN: [id number]   Subjective:  Patient ID: Mark [name], male    DOB: 26-Jun-1951  Age: 64 y.o. MRN: [id number]  CC: Diabetes   HPI Mark [name] presents for    1. CHRONIC DIABETES  Disease Monitoring  Blood Sugar Ranges: 130-160, all < 200  Polyuria: no   Visual problems: no   Medication Compliance: yes. Also taking novolog 3 U in AM prn CBG > 160 Medication Side Effects  Hypoglycemia: no   Preventitive Health Care  Eye Exam: due   Foot Exam: done today   Diet pattern: eats low carb   Exercise: yes, walks for exercise   2. CHRONIC HYPERTENSION  Disease Monitoring  Blood pressure range: not checking   Chest pain: no   Dyspnea: no   Claudication: no   Medication compliance: yes, but did not take meds today   Medication Side Effects  Lightheadedness: no   Urinary frequency: no   Edema: no     Social History  Substance Use Topics  . Smoking status: Former Smoker    Quit date: 08/03/2013  . Smokeless tobacco: Never Used     Comment: 3 cigarettes/day  . Alcohol Use: No     Comment: has quit all alcohol   Outpatient Prescriptions Prior to Visit  Medication Sig Dispense Refill  . ACCU-CHEK AVIVA PLUS test strip USE AS INSTRUCTED 100 each 12  . aspirin EC 81 MG EC tablet Take 1 tablet (81 mg total) by mouth daily. 30 tablet 0  . atorvastatin (LIPITOR) 20 MG tablet Take 1 tablet (20 mg total) by mouth daily. 30 tablet 3  . Blood Glucose Monitoring Suppl (ACCU-CHEK AVIVA PLUS) W/DEVICE KIT Use as prescribed TID before meals and QHS 1 kit 0  . carvedilol (COREG) 3.125 MG tablet Take 1 tablet (3.125 mg total) by mouth 2 (two) times daily with a meal. 60 tablet 3  . ferrous gluconate (FERGON) 324 MG tablet Take 1 tablet (324 mg total) by mouth 2 (two) times daily with a meal. (Patient taking differently: Take 324 mg by mouth daily. ) 60 tablet 1  . glipiZIDE (GLUCOTROL) 5 MG tablet Take 1 tablet (5 mg  total) by mouth 2 (two) times daily before a meal. 60 tablet 3  . glucose blood (ACCU-CHEK AVIVA) test strip Use as instructed 100 each 12  . Insulin Glargine (LANTUS SOLOSTAR) 100 UNIT/ML Solostar Pen Inject 10 Units into the skin daily at 10 pm. 10 pen 3  . Insulin Syringe-Needle U-100 (INSULIN SYRINGE .5CC/30GX5/16") 30G X 5/16" 0.5 ML MISC Check blood sugar TID & QHS 100 each 2  . Lancets (ACCU-CHEK SOFT TOUCH) lancets Use as instructed 100 each 12  . lisinopril (PRINIVIL,ZESTRIL) 5 MG tablet Take 1 tablet (5 mg total) by mouth daily. 30 tablet 2  . loperamide (IMODIUM) 2 MG capsule Take 1 capsule (2 mg total) by mouth as needed for diarrhea or loose stools. 30 capsule 0  . omeprazole (PRILOSEC) 20 MG capsule Take 1 capsule (20 mg total) by mouth daily. 30 capsule 3  . terbinafine (LAMISIL) 250 MG tablet Take 1 tablet (250 mg total) by mouth daily. (Patient not taking: Reported on 08/15/2015) 30 tablet 2   No facility-administered medications prior to visit.    ROS Review of Systems  Constitutional: Negative for fever, chills, fatigue and unexpected weight change.  Eyes: Negative for visual disturbance.  Respiratory: Negative for cough and  shortness of breath.   Cardiovascular: Negative for chest pain, palpitations and leg swelling.  Gastrointestinal: Negative for nausea, vomiting, abdominal pain, diarrhea, constipation and blood in stool.  Endocrine: Negative for polydipsia, polyphagia and polyuria.  Musculoskeletal: Negative for myalgias, back pain, arthralgias, gait problem and neck pain.  Skin: Negative for rash.  Allergic/Immunologic: Negative for immunocompromised state.  Hematological: Negative for adenopathy. Does not bruise/bleed easily.  Psychiatric/Behavioral: Negative for suicidal ideas, sleep disturbance and dysphoric mood. The patient is not nervous/anxious.     Objective:  BP 178/91 mmHg  Pulse 78  Temp(Src) 98.8 F (37.1 C) (Oral)  Resp 16  Ht _0  (1.88 m)  Wt  220 lb (99.791 kg)  BMI 28.23 kg/m2  SpO2 100%  BP/Weight 08/15/2015 04/30/2015 7/74/1423  Systolic BP 953 202 334  Diastolic BP 91 68 73  Wt. (Lbs) 220 195 -  BMI 28.23 25.03 -  Some encounter information is confidential and restricted. Go to Review Flowsheets activity to see all data.   Physical Exam  Constitutional: He appears well-developed and well-nourished. No distress.  HENT:  Head: Normocephalic and atraumatic.  Neck: Normal range of motion. Neck supple.  Cardiovascular: Normal rate, regular rhythm, normal heart sounds and intact distal pulses.   Pulmonary/Chest: Effort normal and breath sounds normal.  Musculoskeletal: He exhibits no edema.  L BKA  Thickened and yellow nails R foot   Neurological: He is alert.  Skin: Skin is warm and dry. No rash noted. No erythema.  Psychiatric: He has a normal mood and affect.   Lab Results  Component Value Date   HGBA1C 7.90 08/15/2015   CBG 151  Assessment & Plan:   Problem List Items Addressed This Visit    Diabetes mellitus, type 2 ([place]) - Primary (Chronic)    Well controlled Continue current regimen of lantus 10 U Increase glipizide to 10 mg BID so patient will not need prn novolog       Relevant Medications   Insulin Glargine (LANTUS SOLOSTAR) 100 UNIT/ML Solostar Pen   glipiZIDE (GLUCOTROL) 10 MG tablet   lisinopril (PRINIVIL,ZESTRIL) 5 MG tablet   Other Relevant Orders   POCT glycosylated hemoglobin (Hb A1C) (Completed)   POCT glucose (manual entry) (Completed)   Microalbumin/Creatinine Ratio, Urine   Ambulatory referral to Ophthalmology   COMPLETE METABOLIC PANEL WITH GFR   Essential hypertension (Chronic)    A; elevated BP today, no meds today P: Continue current regimen Close f.u for recheck       Relevant Medications   carvedilol (COREG) 3.125 MG tablet   lisinopril (PRINIVIL,ZESTRIL) 5 MG tablet   Onychomycosis of toenail   Relevant Medications   terbinafine (LAMISIL) 250 MG tablet    Other Visit  Diagnoses    Healthcare maintenance        Relevant Orders    Flu Vaccine QUAD 36+ mos IM (Completed)    Ambulatory referral to Gastroenterology    Need for hepatitis C screening test        Relevant Orders    Hepatitis C antibody, reflex       No orders of the defined types were placed in this encounter.    Follow-up: No Follow-up on file.   Boykin Nearing MD

## 2015-08-15 NOTE — Assessment & Plan Note (Signed)
Well controlled Continue current regimen of lantus 10 U Increase glipizide to 10 mg BID so patient will not need prn novolog

## 2015-08-16 LAB — MICROALBUMIN / CREATININE URINE RATIO
Creatinine, Urine: 108 mg/dL (ref 20–370)
Microalb Creat Ratio: 4221 mcg/mg creat — ABNORMAL HIGH (ref <30)
Microalb, Ur: 455.9 mg/dL

## 2015-08-16 LAB — HEPATITIS C ANTIBODY: HCV Ab: NEGATIVE

## 2015-08-18 DIAGNOSIS — N183 Chronic kidney disease, stage 3 unspecified: Secondary | ICD-10-CM

## 2015-08-18 DIAGNOSIS — R809 Proteinuria, unspecified: Secondary | ICD-10-CM

## 2015-08-18 HISTORY — DX: Proteinuria, unspecified: R80.9

## 2015-08-18 HISTORY — DX: Chronic kidney disease, stage 3 unspecified: N18.30

## 2015-08-18 MED ORDER — LISINOPRIL 10 MG PO TABS: 10.0000 mg | ORAL_TABLET | Freq: Every day | ORAL | Status: DC

## 2015-08-18 NOTE — Addendum Note (Signed)
Addended by: Boykin Nearing on: 08/18/2015 10:36 AM   Modules accepted: Orders

## 2015-08-18 NOTE — Assessment & Plan Note (Signed)
Elevate urine microalbumin, please increase lisinopril to 10 mg daily from 5 mg daily

## 2015-08-18 NOTE — Assessment & Plan Note (Signed)
A: CKD with elevate urine microalbumin in controlled diabetes. Poorly controlled HTN.   P: increase lisinopril to 10 mg daily from 5 mg daily. Taper lisinopril to 40 as tolerated by BP and Cr.

## 2015-08-27 ENCOUNTER — Telehealth: Payer: Self-pay | Admitting: *Deleted

## 2015-08-27 NOTE — Telephone Encounter (Signed)
LVM to return call.

## 2015-08-27 NOTE — Telephone Encounter (Signed)
-----   Message from Boykin Nearing, MD sent at 08/18/2015 10:32 AM EST ----- Cr improved on CMP, glucose normal Screening hep C negative Elevate urine microalbumin, please increase lisinopril to 10 mg daily from 5 mg daily

## 2015-08-27 NOTE — Telephone Encounter (Signed)
Pt return call  Date birth verified by pt  Cr improved, normal glucose Hep C negative, elevated urine microalbumin Increased lisinopril from 10 mg to 5 mg  Pt verbalized understanding

## 2015-09-09 ENCOUNTER — Encounter: Payer: Medicaid Other | Admitting: Pharmacist

## 2015-09-10 ENCOUNTER — Other Ambulatory Visit: Payer: Self-pay | Admitting: Family Medicine

## 2015-09-10 MED FILL — TERBINAFINE HCL 250 MG TAB: 250 | 30 days supply | Qty: 30 | Fill #1

## 2015-09-10 MED FILL — ATORVASTATIN 20 MG TABLET: 20 | 30 days supply | Qty: 30 | Fill #2

## 2015-09-10 MED FILL — CARVEDILOL 3.125 MG TABLET: 3.125 | 30 days supply | Qty: 60 | Fill #2

## 2015-09-24 MED FILL — glipiZIDE 10 MG TABS: 10 | 30 days supply | Qty: 60 | Fill #1

## 2015-10-10 MED FILL — LISINOPRIL 10 MG TABLET: 10 | 30 days supply | Qty: 30 | Fill #1

## 2015-10-10 MED FILL — ATORVASTATIN 20 MG TABLET: 20 | 30 days supply | Qty: 30 | Fill #3

## 2015-10-10 MED FILL — CARVEDILOL 3.125 MG TABLET: 3.125 | 30 days supply | Qty: 60 | Fill #3

## 2015-10-28 ENCOUNTER — Other Ambulatory Visit: Payer: Self-pay | Admitting: Internal Medicine

## 2015-10-28 MED FILL — glipiZIDE 10 MG TABS: 10 | 30 days supply | Qty: 60 | Fill #2

## 2015-10-29 MED FILL — BD PEN NDL MINI 31GX5MM: 31G X 5 MM | 25 days supply | Qty: 100 | Fill #0

## 2015-11-13 MED FILL — CARVEDILOL 3.125 MG TABLET: 3.125 | 30 days supply | Qty: 60 | Fill #2

## 2015-11-13 MED FILL — LISINOPRIL 10 MG TABLET: 10 | 30 days supply | Qty: 30 | Fill #2

## 2015-11-14 ENCOUNTER — Other Ambulatory Visit: Payer: Self-pay | Admitting: Family Medicine

## 2015-11-19 ENCOUNTER — Telehealth: Payer: Self-pay | Admitting: *Deleted

## 2015-11-19 NOTE — Telephone Encounter (Signed)
Medical Assistant left message on patient's home and cell voicemail. Voicemail states to give a call back to Nubia with [facility] at [phone].  

## 2015-11-26 ENCOUNTER — Telehealth: Payer: Self-pay | Admitting: Family Medicine

## 2015-11-26 NOTE — Telephone Encounter (Signed)
Patient called back, please f/u with pt.

## 2015-11-27 NOTE — Telephone Encounter (Signed)
MA called to inquire about patient participation in the Diabetic Study.

## 2015-12-01 MED FILL — glipiZIDE 10 MG TABS: 10 | 30 days supply | Qty: 60 | Fill #3

## 2015-12-01 MED FILL — ATORVASTATIN 20 MG TABLET: 20 | 30 days supply | Qty: 30 | Fill #2

## 2015-12-18 MED FILL — CARVEDILOL 3.125 MG TABLET: 3.125 | 30 days supply | Qty: 60 | Fill #3

## 2015-12-18 MED FILL — LISINOPRIL 10 MG TABLET: 10 | 30 days supply | Qty: 30 | Fill #3

## 2016-01-01 ENCOUNTER — Emergency Department (HOSPITAL_COMMUNITY): Payer: Medicaid Other

## 2016-01-01 ENCOUNTER — Encounter (HOSPITAL_COMMUNITY): Payer: Self-pay | Admitting: Emergency Medicine

## 2016-01-01 ENCOUNTER — Inpatient Hospital Stay (HOSPITAL_COMMUNITY)
Admission: EM | Admit: 2016-01-01 | Discharge: 2016-01-08 | DRG: 637 | Disposition: A | Discharge status: Skilled Nursing Facility | Payer: Medicaid Other | Attending: Family Medicine | Admitting: Family Medicine

## 2016-01-01 DIAGNOSIS — T68XXXA Hypothermia, initial encounter: Secondary | ICD-10-CM

## 2016-01-01 DIAGNOSIS — L02416 Cutaneous abscess of left lower limb: Secondary | ICD-10-CM | POA: Diagnosis present

## 2016-01-01 DIAGNOSIS — G40909 Epilepsy, unspecified, not intractable, without status epilepticus: Secondary | ICD-10-CM | POA: Diagnosis present

## 2016-01-01 DIAGNOSIS — F1410 Cocaine abuse, uncomplicated: Secondary | ICD-10-CM | POA: Diagnosis present

## 2016-01-01 DIAGNOSIS — G9340 Encephalopathy, unspecified: Secondary | ICD-10-CM

## 2016-01-01 DIAGNOSIS — Z87891 Personal history of nicotine dependence: Secondary | ICD-10-CM

## 2016-01-01 DIAGNOSIS — Z6826 Body mass index (BMI) 26.0-26.9, adult: Secondary | ICD-10-CM

## 2016-01-01 DIAGNOSIS — Z833 Family history of diabetes mellitus: Secondary | ICD-10-CM

## 2016-01-01 DIAGNOSIS — Z89519 Acquired absence of unspecified leg below knee: Secondary | ICD-10-CM

## 2016-01-01 DIAGNOSIS — E663 Overweight: Secondary | ICD-10-CM | POA: Diagnosis present

## 2016-01-01 DIAGNOSIS — Z8619 Personal history of other infectious and parasitic diseases: Secondary | ICD-10-CM

## 2016-01-01 DIAGNOSIS — I5042 Chronic combined systolic (congestive) and diastolic (congestive) heart failure: Secondary | ICD-10-CM | POA: Diagnosis not present

## 2016-01-01 DIAGNOSIS — E118 Type 2 diabetes mellitus with unspecified complications: Secondary | ICD-10-CM

## 2016-01-01 DIAGNOSIS — R680 Hypothermia, not associated with low environmental temperature: Secondary | ICD-10-CM | POA: Diagnosis present

## 2016-01-01 DIAGNOSIS — E876 Hypokalemia: Secondary | ICD-10-CM | POA: Diagnosis present

## 2016-01-01 DIAGNOSIS — Z9081 Acquired absence of spleen: Secondary | ICD-10-CM

## 2016-01-01 DIAGNOSIS — E785 Hyperlipidemia, unspecified: Secondary | ICD-10-CM | POA: Diagnosis present

## 2016-01-01 DIAGNOSIS — E162 Hypoglycemia, unspecified: Secondary | ICD-10-CM

## 2016-01-01 DIAGNOSIS — N179 Acute kidney failure, unspecified: Secondary | ICD-10-CM | POA: Diagnosis present

## 2016-01-01 DIAGNOSIS — T148XXA Other injury of unspecified body region, initial encounter: Secondary | ICD-10-CM

## 2016-01-01 DIAGNOSIS — E11649 Type 2 diabetes mellitus with hypoglycemia without coma: Principal | ICD-10-CM | POA: Diagnosis present

## 2016-01-01 DIAGNOSIS — E11622 Type 2 diabetes mellitus with other skin ulcer: Secondary | ICD-10-CM | POA: Diagnosis present

## 2016-01-01 DIAGNOSIS — N186 End stage renal disease: Secondary | ICD-10-CM

## 2016-01-01 DIAGNOSIS — I5032 Chronic diastolic (congestive) heart failure: Secondary | ICD-10-CM | POA: Diagnosis present

## 2016-01-01 DIAGNOSIS — L97829 Non-pressure chronic ulcer of other part of left lower leg with unspecified severity: Secondary | ICD-10-CM | POA: Diagnosis present

## 2016-01-01 DIAGNOSIS — Z23 Encounter for immunization: Secondary | ICD-10-CM

## 2016-01-01 DIAGNOSIS — I10 Essential (primary) hypertension: Secondary | ICD-10-CM

## 2016-01-01 DIAGNOSIS — J189 Pneumonia, unspecified organism: Secondary | ICD-10-CM

## 2016-01-01 DIAGNOSIS — E440 Moderate protein-calorie malnutrition: Secondary | ICD-10-CM | POA: Diagnosis present

## 2016-01-01 DIAGNOSIS — E11628 Type 2 diabetes mellitus with other skin complications: Secondary | ICD-10-CM | POA: Diagnosis present

## 2016-01-01 DIAGNOSIS — R4182 Altered mental status, unspecified: Secondary | ICD-10-CM

## 2016-01-01 DIAGNOSIS — G9341 Metabolic encephalopathy: Secondary | ICD-10-CM | POA: Diagnosis present

## 2016-01-01 DIAGNOSIS — N183 Chronic kidney disease, stage 3 (moderate): Secondary | ICD-10-CM | POA: Diagnosis present

## 2016-01-01 DIAGNOSIS — R569 Unspecified convulsions: Secondary | ICD-10-CM | POA: Diagnosis not present

## 2016-01-01 DIAGNOSIS — Z89512 Acquired absence of left leg below knee: Secondary | ICD-10-CM

## 2016-01-01 DIAGNOSIS — Z8719 Personal history of other diseases of the digestive system: Secondary | ICD-10-CM

## 2016-01-01 DIAGNOSIS — E1122 Type 2 diabetes mellitus with diabetic chronic kidney disease: Secondary | ICD-10-CM

## 2016-01-01 DIAGNOSIS — I248 Other forms of acute ischemic heart disease: Secondary | ICD-10-CM | POA: Diagnosis present

## 2016-01-01 DIAGNOSIS — Z794 Long term (current) use of insulin: Secondary | ICD-10-CM

## 2016-01-01 DIAGNOSIS — Z7982 Long term (current) use of aspirin: Secondary | ICD-10-CM

## 2016-01-01 DIAGNOSIS — L089 Local infection of the skin and subcutaneous tissue, unspecified: Secondary | ICD-10-CM

## 2016-01-01 DIAGNOSIS — D6489 Other specified anemias: Secondary | ICD-10-CM | POA: Diagnosis present

## 2016-01-01 DIAGNOSIS — E1140 Type 2 diabetes mellitus with diabetic neuropathy, unspecified: Secondary | ICD-10-CM | POA: Diagnosis present

## 2016-01-01 DIAGNOSIS — F14129 Cocaine abuse with intoxication, unspecified: Secondary | ICD-10-CM | POA: Diagnosis present

## 2016-01-01 DIAGNOSIS — K219 Gastro-esophageal reflux disease without esophagitis: Secondary | ICD-10-CM | POA: Diagnosis present

## 2016-01-01 DIAGNOSIS — E860 Dehydration: Secondary | ICD-10-CM | POA: Diagnosis present

## 2016-01-01 DIAGNOSIS — I130 Hypertensive heart and chronic kidney disease with heart failure and stage 1 through stage 4 chronic kidney disease, or unspecified chronic kidney disease: Secondary | ICD-10-CM | POA: Diagnosis present

## 2016-01-01 DIAGNOSIS — E1165 Type 2 diabetes mellitus with hyperglycemia: Secondary | ICD-10-CM | POA: Diagnosis present

## 2016-01-01 HISTORY — DX: Personal history of other infectious and parasitic diseases: Z86.19

## 2016-01-01 HISTORY — DX: Encephalopathy, unspecified: G93.40

## 2016-01-01 HISTORY — DX: Hypothermia, initial encounter: T68.XXXA

## 2016-01-01 LAB — RAPID URINE DRUG SCREEN, HOSP PERFORMED
Amphetamines: NOT DETECTED
Barbiturates: NOT DETECTED
Benzodiazepines: NOT DETECTED
Cocaine: POSITIVE — AB
Opiates: NOT DETECTED
Tetrahydrocannabinol: NOT DETECTED

## 2016-01-01 LAB — CBG MONITORING, ED
Glucose-Capillary: 85 mg/dL (ref 65–99)
Glucose-Capillary: 113 mg/dL — ABNORMAL HIGH (ref 65–99)
Glucose-Capillary: 59 mg/dL — ABNORMAL LOW (ref 65–99)
Glucose-Capillary: 94 mg/dL (ref 65–99)
Glucose-Capillary: 61 mg/dL — ABNORMAL LOW (ref 65–99)
Glucose-Capillary: 75 mg/dL (ref 65–99)

## 2016-01-01 LAB — BASIC METABOLIC PANEL
Anion gap: 11 (ref 5–15)
BUN: 32 mg/dL — ABNORMAL HIGH (ref 6–20)
CO2: 21 mmol/L — ABNORMAL LOW (ref 22–32)
Calcium: 8.7 mg/dL — ABNORMAL LOW (ref 8.9–10.3)
Chloride: 110 mmol/L (ref 101–111)
Creatinine, Ser: 2.17 mg/dL — ABNORMAL HIGH (ref 0.61–1.24)
GFR calc Af Amer: 35 mL/min — ABNORMAL LOW (ref >60)
GFR calc non Af Amer: 30 mL/min — ABNORMAL LOW (ref >60)
Glucose, Bld: 69 mg/dL (ref 65–99)
Potassium: 3.3 mmol/L — ABNORMAL LOW (ref 3.5–5.1)
Sodium: 142 mmol/L (ref 135–145)

## 2016-01-01 LAB — CBC WITH DIFFERENTIAL/PLATELET
Basophils Absolute: 0.2 10*3/uL — ABNORMAL HIGH (ref 0.0–0.1)
Basophils Relative: 3 %
Eosinophils Absolute: 0 10*3/uL (ref 0.0–0.7)
Eosinophils Relative: 0 %
HCT: 34.8 % — ABNORMAL LOW (ref 39.0–52.0)
Hemoglobin: 11.7 g/dL — ABNORMAL LOW (ref 13.0–17.0)
Lymphocytes Relative: 35 %
Lymphs Abs: 2.7 10*3/uL (ref 0.7–4.0)
MCH: 29.5 pg (ref 26.0–34.0)
MCHC: 33.6 g/dL (ref 30.0–36.0)
MCV: 87.7 fL (ref 78.0–100.0)
Monocytes Absolute: 1.3 10*3/uL — ABNORMAL HIGH (ref 0.1–1.0)
Monocytes Relative: 17 %
Neutro Abs: 3.5 10*3/uL (ref 1.7–7.7)
Neutrophils Relative %: 45 %
Platelets: 182 10*3/uL (ref 150–400)
RBC: 3.97 MIL/uL — ABNORMAL LOW (ref 4.22–5.81)
RDW: 13.9 % (ref 11.5–15.5)
WBC: 7.7 10*3/uL (ref 4.0–10.5)

## 2016-01-01 LAB — URINALYSIS, ROUTINE W REFLEX MICROSCOPIC
Bilirubin Urine: NEGATIVE
Glucose, UA: NEGATIVE mg/dL
Ketones, ur: NEGATIVE mg/dL
Leukocytes, UA: NEGATIVE
Nitrite: NEGATIVE
Protein, ur: >300 mg/dL — AB
Specific Gravity, Urine: 1.02 (ref 1.005–1.030)
pH: 5 (ref 5.0–8.0)

## 2016-01-01 LAB — URINE MICROSCOPIC-ADD ON

## 2016-01-01 LAB — ETHANOL: Alcohol, Ethyl (B): <5 mg/dL (ref <5)

## 2016-01-01 MED ORDER — ACETAMINOPHEN 650 MG RE SUPP
650.0000 mg | Freq: Four times a day (QID) | RECTAL | Status: DC | PRN
  Administered 2016-01-01: 650 mg via RECTAL
  Filled 2016-01-01: qty 1

## 2016-01-01 MED ORDER — ONDANSETRON HCL 4 MG PO TABS: 4.0000 mg | ORAL_TABLET | Freq: Four times a day (QID) | ORAL | Status: DC | PRN

## 2016-01-01 MED ORDER — THIAMINE HCL 100 MG/ML IJ SOLN
100.0000 mg | Freq: Every day | INTRAMUSCULAR | Status: DC
  Administered 2016-01-01: 100 mg via INTRAVENOUS
  Filled 2016-01-01 (×3): qty 2

## 2016-01-01 MED ORDER — DEXTROSE-NACL 5-0.9 % IV SOLN
Freq: Once | INTRAVENOUS | Status: AC
  Administered 2016-01-01: 20:00:00 via INTRAVENOUS

## 2016-01-01 MED ORDER — LORAZEPAM 2 MG/ML IJ SOLN
0.0000 mg | Freq: Two times a day (BID) | INTRAMUSCULAR | Status: DC
  Administered 2016-01-04 (×2): 1 mg via INTRAVENOUS
  Filled 2016-01-01 (×3): qty 1

## 2016-01-01 MED ORDER — VANCOMYCIN HCL 10 G IV SOLR
2000.0000 mg | Freq: Once | INTRAVENOUS | Status: AC
  Administered 2016-01-02: 2000 mg via INTRAVENOUS
  Filled 2016-01-01: qty 2000

## 2016-01-01 MED ORDER — SODIUM CHLORIDE 0.9 % IV BOLUS (SEPSIS)
1000.0000 mL | Freq: Once | INTRAVENOUS | Status: AC
  Administered 2016-01-01: 1000 mL via INTRAVENOUS

## 2016-01-01 MED ORDER — PANTOPRAZOLE SODIUM 40 MG IV SOLR
40.0000 mg | INTRAVENOUS | Status: DC
  Administered 2016-01-01: 40 mg via INTRAVENOUS
  Filled 2016-01-01: qty 40

## 2016-01-01 MED ORDER — HYDRALAZINE HCL 20 MG/ML IJ SOLN
5.0000 mg | INTRAMUSCULAR | Status: DC | PRN
  Administered 2016-01-02 – 2016-01-08 (×6): 5 mg via INTRAVENOUS
  Filled 2016-01-01 (×7): qty 1

## 2016-01-01 MED ORDER — LORAZEPAM 2 MG/ML IJ SOLN
0.0000 mg | Freq: Four times a day (QID) | INTRAMUSCULAR | Status: AC
  Administered 2016-01-01: 4 mg via INTRAVENOUS
  Administered 2016-01-02: 1 mg via INTRAVENOUS
  Administered 2016-01-02: 2 mg via INTRAVENOUS
  Administered 2016-01-03: 1 mg via INTRAVENOUS
  Administered 2016-01-03: 2 mg via INTRAVENOUS
  Filled 2016-01-01: qty 1
  Filled 2016-01-01: qty 2
  Filled 2016-01-01 (×5): qty 1

## 2016-01-01 MED ORDER — LORAZEPAM 2 MG/ML IJ SOLN
INTRAMUSCULAR | Status: AC
  Administered 2016-01-01: 2 mg
  Filled 2016-01-01: qty 1

## 2016-01-01 MED ORDER — POTASSIUM CHLORIDE 10 MEQ/100ML IV SOLN
10.0000 meq | INTRAVENOUS | Status: AC
  Administered 2016-01-01 (×2): 10 meq via INTRAVENOUS
  Filled 2016-01-01 (×2): qty 100

## 2016-01-01 MED ORDER — ONDANSETRON HCL 4 MG/2ML IJ SOLN: 4.0000 mg | Freq: Four times a day (QID) | INTRAMUSCULAR | Status: DC | PRN

## 2016-01-01 MED ORDER — DEXTROSE-NACL 5-0.9 % IV SOLN
Freq: Once | INTRAVENOUS | Status: AC
  Administered 2016-01-01: 18:00:00 via INTRAVENOUS

## 2016-01-01 MED ORDER — PIPERACILLIN-TAZOBACTAM 3.375 G IVPB 30 MIN
3.3750 g | Freq: Once | INTRAVENOUS | Status: AC
  Administered 2016-01-02: 3.375 g via INTRAVENOUS
  Filled 2016-01-01: qty 50

## 2016-01-01 MED ORDER — DEXTROSE 50 % IV SOLN
25.0000 mL | Freq: Once | INTRAVENOUS | Status: AC
  Administered 2016-01-01: 25 mL via INTRAVENOUS
  Filled 2016-01-01: qty 50

## 2016-01-01 MED ORDER — VITAMIN B-1 100 MG PO TABS
100.0000 mg | ORAL_TABLET | Freq: Every day | ORAL | Status: DC
  Administered 2016-01-02 – 2016-01-08 (×6): 100 mg via ORAL
  Filled 2016-01-01 (×7): qty 1

## 2016-01-01 MED ORDER — ENOXAPARIN SODIUM 40 MG/0.4ML ~~LOC~~ SOLN
40.0000 mg | SUBCUTANEOUS | Status: DC
  Administered 2016-01-02 – 2016-01-04 (×3): 40 mg via SUBCUTANEOUS
  Filled 2016-01-01 (×4): qty 0.4

## 2016-01-01 MED ORDER — DEXTROSE 50 % IV SOLN: 25.0000 mL | INTRAVENOUS | Status: DC | PRN

## 2016-01-01 MED ORDER — SODIUM CHLORIDE 0.9% FLUSH
3.0000 mL | Freq: Two times a day (BID) | INTRAVENOUS | Status: DC
  Administered 2016-01-02 – 2016-01-08 (×10): 3 mL via INTRAVENOUS

## 2016-01-01 MED ORDER — PIPERACILLIN-TAZOBACTAM 3.375 G IVPB
3.3750 g | Freq: Three times a day (TID) | INTRAVENOUS | Status: DC
  Administered 2016-01-02: 3.375 g via INTRAVENOUS
  Filled 2016-01-01 (×3): qty 50

## 2016-01-01 MED ORDER — LORAZEPAM 2 MG/ML IJ SOLN
INTRAMUSCULAR | Status: AC
  Administered 2016-01-01: 2 mg via INTRAVENOUS
  Filled 2016-01-01: qty 1

## 2016-01-01 MED ORDER — ACETAMINOPHEN 325 MG PO TABS
650.0000 mg | ORAL_TABLET | Freq: Four times a day (QID) | ORAL | Status: DC | PRN
  Administered 2016-01-07: 650 mg via ORAL
  Filled 2016-01-01 (×2): qty 2

## 2016-01-01 MED ORDER — DEXTROSE-NACL 5-0.9 % IV SOLN: Freq: Once | INTRAVENOUS | Status: DC

## 2016-01-01 MED ORDER — DEXTROSE 50 % IV SOLN
25.0000 mL | Freq: Once | INTRAVENOUS | Status: AC
  Administered 2016-01-01: 12.5 mL via INTRAVENOUS

## 2016-01-01 MED ORDER — METOPROLOL TARTRATE 5 MG/5ML IV SOLN
2.5000 mg | Freq: Three times a day (TID) | INTRAVENOUS | Status: DC
  Administered 2016-01-01 – 2016-01-02 (×2): 2.5 mg via INTRAVENOUS
  Filled 2016-01-01 (×2): qty 5

## 2016-01-01 MED ORDER — LORAZEPAM 2 MG/ML IJ SOLN
2.0000 mg | Freq: Once | INTRAMUSCULAR | Status: AC
  Administered 2016-01-01: 2 mg via INTRAVENOUS

## 2016-01-01 MED ORDER — DEXTROSE 10 % IV SOLN
INTRAVENOUS | Status: DC
  Administered 2016-01-01: 22:00:00 via INTRAVENOUS

## 2016-01-01 MED ORDER — ASPIRIN 300 MG RE SUPP
300.0000 mg | Freq: Every day | RECTAL | Status: DC
  Administered 2016-01-01: 300 mg via RECTAL
  Filled 2016-01-01: qty 1

## 2016-01-01 NOTE — ED Notes (Signed)
CBG 94 

## 2016-01-01 NOTE — ED Notes (Signed)
Dr Blaine Hamper paged due to rectal temp of 100.9

## 2016-01-01 NOTE — ED Notes (Signed)
CBG 85
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
## 2016-01-01 NOTE — ED Notes (Signed)
This RN and Charm Rings back in room with pt.

## 2016-01-01 NOTE — ED Notes (Signed)
CBG 75. °

## 2016-01-01 NOTE — ED Notes (Signed)
Gave 16 oz orange juice, graham crackers and peanut butter. Patient alert and oriented x 4.. MD notified of CBG

## 2016-01-01 NOTE — ED Notes (Signed)
This RN and Charm Rings escorting pt to CT

## 2016-01-01 NOTE — ED Notes (Signed)
Patient disoriented x4 taking off leads and getting combative. MD at bedside. Per order to gave 1 mg ativan, no change, gave 1 more mg. Rectal temp 96.24f. Patient still disoriented x4. Shaking all over. Wife states he might be withdrawing from possible Cocaine.

## 2016-01-01 NOTE — ED Notes (Signed)
Clayton Bibles in room to talk with pt and family. Pt still disoriented x 4 and slightly agitated.

## 2016-01-01 NOTE — ED Notes (Signed)
CBG 61, MD notified. Admitting doctor ordered increase in dextrose fluids to 125 ml/hr and give 12.5 mg of D50.

## 2016-01-01 NOTE — ED Notes (Signed)
Portable in room.  

## 2016-01-01 NOTE — ED Notes (Signed)
Known diabetic, wife found unresponsive on couch, BG 53. Lethargic, responded to sternal rub. 180/102 BP on scene. Nonrebreather for O2. Oral suction several times en route. 20 L Fa, 25 grams D50, BG now 259. Ekg SR, 60. 1642 vitals 199/116, 99 Room Air. Patient arrived alert and oriented x4.

## 2016-01-01 NOTE — Progress Notes (Signed)
Pharmacy Antibiotic Note  Mark [name] is a 65 y.o. male admitted on 01/01/2016 with L BKA stump wound infection.  Pharmacy has been consulted for Vancomycin and Zosyn dosing.  Plan: Zosyn 3.375gm IV now over 30 min then 3.375gm IV q8h - subsequent doses over 4 hours Vancomycin 2gm IV now then 1gm IV q24h Will f/u micro data, renal function, and pt's clinical condition Vanc trough prn  Height: 6\' 2"  (188 cm) Weight: 210 lb (95.255 kg) IBW/kg (Calculated) : 82.2  Temp (24hrs), Avg:98.2 F (36.8 C), Min:96.5 F (35.8 C), Max:100.9 F (38.3 C)   Recent Labs Lab 01/01/16 1650  WBC 7.7  CREATININE 2.17*    Estimated Creatinine Clearance: 40 mL/min (by C-G formula based on Cr of 2.17).    No Known Allergies  Antimicrobials this admission: 5/5 Vanc >>  5/5 Zosyn >>   Dose adjustments this admission: n/a  Microbiology results: BCx x2:   Thank you for allowing pharmacy to be a part of this patient's care.  Sherlon Handing, PharmD, BCPS Clinical pharmacist, pager [phone] 01/01/2016 11:53 PM
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
## 2016-01-01 NOTE — ED Notes (Signed)
CBG 59. 

## 2016-01-01 NOTE — ED Notes (Signed)
Pt now much calmer and less combative.

## 2016-01-02 DIAGNOSIS — E1165 Type 2 diabetes mellitus with hyperglycemia: Secondary | ICD-10-CM | POA: Diagnosis present

## 2016-01-02 DIAGNOSIS — Z89512 Acquired absence of left leg below knee: Secondary | ICD-10-CM

## 2016-01-02 DIAGNOSIS — L089 Local infection of the skin and subcutaneous tissue, unspecified: Secondary | ICD-10-CM

## 2016-01-02 DIAGNOSIS — E860 Dehydration: Secondary | ICD-10-CM | POA: Diagnosis present

## 2016-01-02 DIAGNOSIS — F1410 Cocaine abuse, uncomplicated: Secondary | ICD-10-CM | POA: Diagnosis present

## 2016-01-02 DIAGNOSIS — I130 Hypertensive heart and chronic kidney disease with heart failure and stage 1 through stage 4 chronic kidney disease, or unspecified chronic kidney disease: Secondary | ICD-10-CM | POA: Diagnosis present

## 2016-01-02 DIAGNOSIS — G9341 Metabolic encephalopathy: Secondary | ICD-10-CM | POA: Diagnosis present

## 2016-01-02 DIAGNOSIS — E785 Hyperlipidemia, unspecified: Secondary | ICD-10-CM | POA: Diagnosis present

## 2016-01-02 DIAGNOSIS — E663 Overweight: Secondary | ICD-10-CM | POA: Diagnosis present

## 2016-01-02 DIAGNOSIS — Z9081 Acquired absence of spleen: Secondary | ICD-10-CM | POA: Diagnosis not present

## 2016-01-02 DIAGNOSIS — R4182 Altered mental status, unspecified: Secondary | ICD-10-CM | POA: Diagnosis not present

## 2016-01-02 DIAGNOSIS — K219 Gastro-esophageal reflux disease without esophagitis: Secondary | ICD-10-CM | POA: Diagnosis present

## 2016-01-02 DIAGNOSIS — E440 Moderate protein-calorie malnutrition: Secondary | ICD-10-CM | POA: Diagnosis present

## 2016-01-02 DIAGNOSIS — E1122 Type 2 diabetes mellitus with diabetic chronic kidney disease: Secondary | ICD-10-CM

## 2016-01-02 DIAGNOSIS — N179 Acute kidney failure, unspecified: Secondary | ICD-10-CM | POA: Diagnosis present

## 2016-01-02 DIAGNOSIS — L02416 Cutaneous abscess of left lower limb: Secondary | ICD-10-CM | POA: Diagnosis present

## 2016-01-02 DIAGNOSIS — I248 Other forms of acute ischemic heart disease: Secondary | ICD-10-CM | POA: Diagnosis present

## 2016-01-02 DIAGNOSIS — E11622 Type 2 diabetes mellitus with other skin ulcer: Secondary | ICD-10-CM | POA: Diagnosis present

## 2016-01-02 DIAGNOSIS — R680 Hypothermia, not associated with low environmental temperature: Secondary | ICD-10-CM | POA: Diagnosis present

## 2016-01-02 DIAGNOSIS — E876 Hypokalemia: Secondary | ICD-10-CM | POA: Diagnosis present

## 2016-01-02 DIAGNOSIS — Z794 Long term (current) use of insulin: Secondary | ICD-10-CM | POA: Diagnosis not present

## 2016-01-02 DIAGNOSIS — Z23 Encounter for immunization: Secondary | ICD-10-CM | POA: Diagnosis not present

## 2016-01-02 DIAGNOSIS — D6489 Other specified anemias: Secondary | ICD-10-CM | POA: Diagnosis present

## 2016-01-02 DIAGNOSIS — Z6826 Body mass index (BMI) 26.0-26.9, adult: Secondary | ICD-10-CM | POA: Diagnosis not present

## 2016-01-02 DIAGNOSIS — I5042 Chronic combined systolic (congestive) and diastolic (congestive) heart failure: Secondary | ICD-10-CM | POA: Diagnosis present

## 2016-01-02 DIAGNOSIS — L97829 Non-pressure chronic ulcer of other part of left lower leg with unspecified severity: Secondary | ICD-10-CM | POA: Diagnosis present

## 2016-01-02 DIAGNOSIS — Z8619 Personal history of other infectious and parasitic diseases: Secondary | ICD-10-CM | POA: Diagnosis not present

## 2016-01-02 DIAGNOSIS — E162 Hypoglycemia, unspecified: Secondary | ICD-10-CM | POA: Diagnosis not present

## 2016-01-02 DIAGNOSIS — I10 Essential (primary) hypertension: Secondary | ICD-10-CM | POA: Diagnosis not present

## 2016-01-02 DIAGNOSIS — E11649 Type 2 diabetes mellitus with hypoglycemia without coma: Secondary | ICD-10-CM | POA: Diagnosis not present

## 2016-01-02 DIAGNOSIS — Z7982 Long term (current) use of aspirin: Secondary | ICD-10-CM | POA: Diagnosis not present

## 2016-01-02 DIAGNOSIS — Z833 Family history of diabetes mellitus: Secondary | ICD-10-CM | POA: Diagnosis not present

## 2016-01-02 DIAGNOSIS — F14129 Cocaine abuse with intoxication, unspecified: Secondary | ICD-10-CM | POA: Diagnosis present

## 2016-01-02 DIAGNOSIS — G9340 Encephalopathy, unspecified: Secondary | ICD-10-CM | POA: Diagnosis not present

## 2016-01-02 DIAGNOSIS — Z87891 Personal history of nicotine dependence: Secondary | ICD-10-CM | POA: Diagnosis not present

## 2016-01-02 DIAGNOSIS — R569 Unspecified convulsions: Secondary | ICD-10-CM | POA: Diagnosis not present

## 2016-01-02 DIAGNOSIS — E1140 Type 2 diabetes mellitus with diabetic neuropathy, unspecified: Secondary | ICD-10-CM | POA: Diagnosis present

## 2016-01-02 DIAGNOSIS — N183 Chronic kidney disease, stage 3 (moderate): Secondary | ICD-10-CM | POA: Diagnosis present

## 2016-01-02 DIAGNOSIS — E11628 Type 2 diabetes mellitus with other skin complications: Secondary | ICD-10-CM | POA: Diagnosis present

## 2016-01-02 DIAGNOSIS — T148XXA Other injury of unspecified body region, initial encounter: Secondary | ICD-10-CM

## 2016-01-02 HISTORY — DX: Local infection of the skin and subcutaneous tissue, unspecified: L08.9

## 2016-01-02 LAB — TROPONIN I
Troponin I: 0.09 ng/mL — ABNORMAL HIGH (ref <0.031)
Troponin I: 0.09 ng/mL — ABNORMAL HIGH (ref <0.031)
Troponin I: 0.07 ng/mL — ABNORMAL HIGH (ref <0.031)

## 2016-01-02 LAB — LIPID PANEL
Cholesterol: 138 mg/dL (ref 0–200)
HDL: 21 mg/dL — ABNORMAL LOW (ref >40)
LDL Cholesterol: 82 mg/dL (ref 0–99)
Total CHOL/HDL Ratio: 6.6 RATIO
Triglycerides: 173 mg/dL — ABNORMAL HIGH (ref <150)
VLDL: 35 mg/dL (ref 0–40)

## 2016-01-02 LAB — CBC
HCT: 29.5 % — ABNORMAL LOW (ref 39.0–52.0)
Hemoglobin: 9.9 g/dL — ABNORMAL LOW (ref 13.0–17.0)
MCH: 29.8 pg (ref 26.0–34.0)
MCHC: 33.6 g/dL (ref 30.0–36.0)
MCV: 88.9 fL (ref 78.0–100.0)
Platelets: 205 10*3/uL (ref 150–400)
RBC: 3.32 MIL/uL — ABNORMAL LOW (ref 4.22–5.81)
RDW: 14.3 % (ref 11.5–15.5)
WBC: 15.5 10*3/uL — ABNORMAL HIGH (ref 4.0–10.5)

## 2016-01-02 LAB — BASIC METABOLIC PANEL
Anion gap: 9 (ref 5–15)
BUN: 28 mg/dL — ABNORMAL HIGH (ref 6–20)
CO2: 19 mmol/L — ABNORMAL LOW (ref 22–32)
Calcium: 7.9 mg/dL — ABNORMAL LOW (ref 8.9–10.3)
Chloride: 112 mmol/L — ABNORMAL HIGH (ref 101–111)
Creatinine, Ser: 2.02 mg/dL — ABNORMAL HIGH (ref 0.61–1.24)
GFR calc Af Amer: 38 mL/min — ABNORMAL LOW (ref >60)
GFR calc non Af Amer: 33 mL/min — ABNORMAL LOW (ref >60)
Glucose, Bld: 149 mg/dL — ABNORMAL HIGH (ref 65–99)
Potassium: 3.6 mmol/L (ref 3.5–5.1)
Sodium: 140 mmol/L (ref 135–145)

## 2016-01-02 LAB — TSH: TSH: 1.134 u[IU]/mL (ref 0.350–4.500)

## 2016-01-02 LAB — GLUCOSE, CAPILLARY
Glucose-Capillary: 104 mg/dL — ABNORMAL HIGH (ref 65–99)
Glucose-Capillary: 125 mg/dL — ABNORMAL HIGH (ref 65–99)
Glucose-Capillary: 137 mg/dL — ABNORMAL HIGH (ref 65–99)
Glucose-Capillary: 188 mg/dL — ABNORMAL HIGH (ref 65–99)
Glucose-Capillary: 281 mg/dL — ABNORMAL HIGH (ref 65–99)

## 2016-01-02 LAB — SODIUM, URINE, RANDOM: Sodium, Ur: 69 mmol/L

## 2016-01-02 LAB — LACTIC ACID, PLASMA
Lactic Acid, Venous: 1.1 mmol/L (ref 0.5–2.0)
Lactic Acid, Venous: 1 mmol/L (ref 0.5–2.0)

## 2016-01-02 LAB — APTT: aPTT: 30 seconds (ref 24–37)

## 2016-01-02 LAB — MAGNESIUM: Magnesium: 1.9 mg/dL (ref 1.7–2.4)

## 2016-01-02 LAB — CORTISOL-AM, BLOOD: Cortisol - AM: 10.7 ug/dL (ref 6.7–22.6)

## 2016-01-02 LAB — CREATININE, URINE, RANDOM: Creatinine, Urine: 101.63 mg/dL

## 2016-01-02 LAB — MRSA PCR SCREENING: MRSA by PCR: NEGATIVE

## 2016-01-02 LAB — BRAIN NATRIURETIC PEPTIDE: B Natriuretic Peptide: 496.3 pg/mL — ABNORMAL HIGH (ref 0.0–100.0)

## 2016-01-02 LAB — PROCALCITONIN: Procalcitonin: 0.82 ng/mL

## 2016-01-02 LAB — PROTIME-INR
INR: 1 (ref 0.00–1.49)
Prothrombin Time: 13.4 seconds (ref 11.6–15.2)

## 2016-01-02 LAB — CBG MONITORING, ED
Glucose-Capillary: 96 mg/dL (ref 65–99)
Glucose-Capillary: 94 mg/dL (ref 65–99)

## 2016-01-02 MED ORDER — INSULIN ASPART 100 UNIT/ML ~~LOC~~ SOLN
0.0000 [IU] | Freq: Every day | SUBCUTANEOUS | Status: DC
  Administered 2016-01-02: 3 [IU] via SUBCUTANEOUS
  Administered 2016-01-07: 2 [IU] via SUBCUTANEOUS

## 2016-01-02 MED ORDER — VANCOMYCIN HCL IN DEXTROSE 1-5 GM/200ML-% IV SOLN: 1000.0000 mg | INTRAVENOUS | Status: DC

## 2016-01-02 MED ORDER — ATORVASTATIN CALCIUM 20 MG PO TABS
20.0000 mg | ORAL_TABLET | Freq: Every day | ORAL | Status: DC
  Administered 2016-01-02 – 2016-01-07 (×4): 20 mg via ORAL
  Filled 2016-01-02 (×4): qty 1

## 2016-01-02 MED ORDER — CARVEDILOL 3.125 MG PO TABS
3.1250 mg | ORAL_TABLET | Freq: Two times a day (BID) | ORAL | Status: DC
  Administered 2016-01-02 – 2016-01-04 (×5): 3.125 mg via ORAL
  Filled 2016-01-02 (×5): qty 1

## 2016-01-02 MED ORDER — SODIUM CHLORIDE 0.9 % IV SOLN
INTRAVENOUS | Status: DC
  Administered 2016-01-02: 15:00:00 via INTRAVENOUS

## 2016-01-02 MED ORDER — INSULIN ASPART 100 UNIT/ML ~~LOC~~ SOLN
0.0000 [IU] | Freq: Three times a day (TID) | SUBCUTANEOUS | Status: DC
  Administered 2016-01-03: 3 [IU] via SUBCUTANEOUS
  Administered 2016-01-03 (×2): 2 [IU] via SUBCUTANEOUS

## 2016-01-02 MED ORDER — PANTOPRAZOLE SODIUM 40 MG PO TBEC
40.0000 mg | DELAYED_RELEASE_TABLET | Freq: Every day | ORAL | Status: DC
  Administered 2016-01-03 – 2016-01-08 (×5): 40 mg via ORAL
  Filled 2016-01-02 (×6): qty 1

## 2016-01-02 MED ORDER — ASPIRIN EC 81 MG PO TBEC
81.0000 mg | DELAYED_RELEASE_TABLET | Freq: Every day | ORAL | Status: DC
  Administered 2016-01-02 – 2016-01-08 (×6): 81 mg via ORAL
  Filled 2016-01-02 (×7): qty 1

## 2016-01-02 NOTE — Progress Notes (Signed)
Pt. Blood sugar 281 with no insulin coverage at this time.  Paged physician awaiting reply.  Will continue to monitor.
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
## 2016-01-02 NOTE — ED Notes (Signed)
CBG 96. 

## 2016-01-02 NOTE — Progress Notes (Signed)
Pt transferred from ED to 3s06.  Pt very lethargic, not answering questions.  Will continue to reassess and monitor.
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
## 2016-01-02 NOTE — Progress Notes (Signed)
Patient confused and removing lines and equipment while continually trying to get out of the bed.  Mittens applied to patients hands bed lowered and safety pads placed to the left and right of the bed.  Contacted MD and updated on patients behavior received order for safety sitter.  Request placed for sitter currently one unavailable.  Will continue safety measures as well as bed alarm.

## 2016-01-02 NOTE — Consult Note (Signed)
WOC wound consult note Reason for Consult: Consult requested for left BKA stump. Wound type: There is a skin crease at the bottom of the stump with a deep valley, but no open wound or drainage.  Left outer stump with dry calloused area; .8X.8cm.  No odor, drainage, or fluctuance. Dressing procedure/placement/frequency: Protect site from further injury with foam dressing. Please re-consult if further assistance is needed.  Thank-you,  Julien Girt MSN, [place], [place], [place], [place]

## 2016-01-03 ENCOUNTER — Inpatient Hospital Stay (HOSPITAL_COMMUNITY): Payer: Medicaid Other

## 2016-01-03 DIAGNOSIS — R4182 Altered mental status, unspecified: Secondary | ICD-10-CM

## 2016-01-03 DIAGNOSIS — L089 Local infection of the skin and subcutaneous tissue, unspecified: Secondary | ICD-10-CM

## 2016-01-03 DIAGNOSIS — T148 Other injury of unspecified body region: Secondary | ICD-10-CM

## 2016-01-03 LAB — BASIC METABOLIC PANEL
Anion gap: 9 (ref 5–15)
BUN: 28 mg/dL — ABNORMAL HIGH (ref 6–20)
CO2: 19 mmol/L — ABNORMAL LOW (ref 22–32)
Calcium: 7.6 mg/dL — ABNORMAL LOW (ref 8.9–10.3)
Chloride: 109 mmol/L (ref 101–111)
Creatinine, Ser: 1.99 mg/dL — ABNORMAL HIGH (ref 0.61–1.24)
GFR calc Af Amer: 39 mL/min — ABNORMAL LOW (ref >60)
GFR calc non Af Amer: 34 mL/min — ABNORMAL LOW (ref >60)
Glucose, Bld: 208 mg/dL — ABNORMAL HIGH (ref 65–99)
Potassium: 3.6 mmol/L (ref 3.5–5.1)
Sodium: 137 mmol/L (ref 135–145)

## 2016-01-03 LAB — GLUCOSE, CAPILLARY
Glucose-Capillary: 206 mg/dL — ABNORMAL HIGH (ref 65–99)
Glucose-Capillary: 172 mg/dL — ABNORMAL HIGH (ref 65–99)
Glucose-Capillary: 200 mg/dL — ABNORMAL HIGH (ref 65–99)
Glucose-Capillary: 179 mg/dL — ABNORMAL HIGH (ref 65–99)

## 2016-01-03 LAB — ECHOCARDIOGRAM COMPLETE
Height: 74 in
Weight: 3428.59 oz

## 2016-01-03 LAB — CBC
HCT: 25.4 % — ABNORMAL LOW (ref 39.0–52.0)
Hemoglobin: 8.7 g/dL — ABNORMAL LOW (ref 13.0–17.0)
MCH: 29.8 pg (ref 26.0–34.0)
MCHC: 34.3 g/dL (ref 30.0–36.0)
MCV: 87 fL (ref 78.0–100.0)
Platelets: 220 10*3/uL (ref 150–400)
RBC: 2.92 MIL/uL — ABNORMAL LOW (ref 4.22–5.81)
RDW: 14 % (ref 11.5–15.5)
WBC: 13.1 10*3/uL — ABNORMAL HIGH (ref 4.0–10.5)

## 2016-01-03 LAB — HEMOGLOBIN A1C
Hgb A1c MFr Bld: 8.9 % — ABNORMAL HIGH (ref 4.8–5.6)
Mean Plasma Glucose: 209 mg/dL

## 2016-01-03 MED ORDER — DOXYCYCLINE HYCLATE 100 MG PO TABS
100.0000 mg | ORAL_TABLET | Freq: Two times a day (BID) | ORAL | Status: DC
  Administered 2016-01-03 – 2016-01-08 (×10): 100 mg via ORAL
  Filled 2016-01-03 (×12): qty 1

## 2016-01-03 MED ORDER — PNEUMOCOCCAL VAC POLYVALENT 25 MCG/0.5ML IJ INJ
0.5000 mL | INJECTION | INTRAMUSCULAR | Status: AC
  Administered 2016-01-07: 0.5 mL via INTRAMUSCULAR
  Filled 2016-01-03 (×2): qty 0.5

## 2016-01-03 MED ORDER — ENSURE ENLIVE PO LIQD: 237.0000 mL | Freq: Two times a day (BID) | ORAL | Status: DC

## 2016-01-03 NOTE — Progress Notes (Signed)
Mark [name] [id number] Admission Data: 01/03/2016 6:54 PM Attending Provider: Modena Jansky, MD  [id number], Lennox Laity, MD Consults/ Treatment Team:    Mark [name] is a 65 y.o. male patient admitted from ED awake, alert  & orientated  X 3,  Full Code, VSS - Blood pressure 155/88, pulse 85, temperature 98.8 F (37.1 C), temperature source Oral, resp. rate 16, height 6\' 2"  (1.88 m), weight 97.2 kg (214 lb 4.6 oz), SpO2 99 %., no c/o shortness of breath, no c/o chest pain, no distress noted.  IV site WDL:  forearm left, condition patent and no redness with a transparent dsg that's clean dry and intact.  Allergies:  No Known Allergies   Past Medical History  Diagnosis Date  . Diverticulitis   . Spleen absent   . Hypertension     no pcp  . CHF (congestive heart failure) ([place])   . Chronic kidney disease   . DM (diabetes mellitus), type 2 with peripheral vascular complications ([place])     History:  obtained from unobtainable from patient due to mental status. Tobacco/alcohol: denied none  Pt orientation to unit, room and routine. Information packet given to patient/family and safety video watched.  Admission INP armband ID verified with patient/family, and in place. SR up x 2, fall risk assessment complete with Patient and family verbalizing understanding of risks associated with falls. Pt verbalizes an understanding of how to use the call bell and to call for help before getting out of bed.  Skin, clean-dry- intact without evidence of bruising, or skin tears.   Has L BKA with foam over wound reported from [facility] to be a blister    Will cont to monitor and assist as needed.  Analyse Angst Margaretha Sheffield, RN 01/03/2016 6:54 PM
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
## 2016-01-03 NOTE — Progress Notes (Signed)
  Echocardiogram 2D Echocardiogram has been performed.  Donata Clay 01/03/2016, 9:54 AM

## 2016-01-03 NOTE — Progress Notes (Signed)
Nutrition Brief Note  Patient identified on the Malnutrition Screening Tool (MST) Report. No significant weight loss noted per review of usual weights below.   Wt Readings from Last 15 Encounters:  01/02/16 214 lb 4.6 oz (97.2 kg)  08/15/15 220 lb (99.791 kg)  04/30/15 195 lb (88.451 kg)  04/17/15 181 lb (82.1 kg)  03/11/15 205 lb 9.6 oz (93.26 kg)  12/25/14 197 lb 12.8 oz (89.721 kg)  11/20/14 189 lb (85.73 kg)  10/21/14 207 lb 9.6 oz (94.167 kg)  10/18/14 208 lb 12.4 oz (94.7 kg)  06/24/14 198 lb 3.2 oz (89.903 kg)  04/23/14 190 lb (86.183 kg)  04/08/14 180 lb (81.647 kg)  12/12/13 186 lb (84.369 kg)  11/21/13 187 lb (84.823 kg)  11/01/13 187 lb 14.4 oz (85.231 kg)    Body mass index is 27.5 kg/(m^2). Patient meets criteria for overweight based on current BMI.   Current diet order is heart healthy CHO modified, patient is consuming approximately 95% of meals at this time. Labs and medications reviewed.   No nutrition interventions warranted at this time. If nutrition issues arise, please consult RD.   Molli Barrows, RD, LDN, [place] Pager [phone] After Hours Pager [phone]

## 2016-01-04 ENCOUNTER — Inpatient Hospital Stay (HOSPITAL_COMMUNITY): Payer: Medicaid Other

## 2016-01-04 DIAGNOSIS — G9340 Encephalopathy, unspecified: Secondary | ICD-10-CM

## 2016-01-04 LAB — CBC
HCT: 29.9 % — ABNORMAL LOW (ref 39.0–52.0)
Hemoglobin: 9.8 g/dL — ABNORMAL LOW (ref 13.0–17.0)
MCH: 28.5 pg (ref 26.0–34.0)
MCHC: 32.8 g/dL (ref 30.0–36.0)
MCV: 86.9 fL (ref 78.0–100.0)
Platelets: 336 10*3/uL (ref 150–400)
RBC: 3.44 MIL/uL — ABNORMAL LOW (ref 4.22–5.81)
RDW: 14.3 % (ref 11.5–15.5)
WBC: 13 10*3/uL — ABNORMAL HIGH (ref 4.0–10.5)

## 2016-01-04 LAB — BASIC METABOLIC PANEL
Anion gap: 13 (ref 5–15)
BUN: 24 mg/dL — ABNORMAL HIGH (ref 6–20)
CO2: 19 mmol/L — ABNORMAL LOW (ref 22–32)
Calcium: 8.2 mg/dL — ABNORMAL LOW (ref 8.9–10.3)
Chloride: 110 mmol/L (ref 101–111)
Creatinine, Ser: 1.89 mg/dL — ABNORMAL HIGH (ref 0.61–1.24)
GFR calc Af Amer: 42 mL/min — ABNORMAL LOW (ref >60)
GFR calc non Af Amer: 36 mL/min — ABNORMAL LOW (ref >60)
Glucose, Bld: 205 mg/dL — ABNORMAL HIGH (ref 65–99)
Potassium: 3.7 mmol/L (ref 3.5–5.1)
Sodium: 142 mmol/L (ref 135–145)

## 2016-01-04 LAB — GLUCOSE, CAPILLARY
Glucose-Capillary: 214 mg/dL — ABNORMAL HIGH (ref 65–99)
Glucose-Capillary: 134 mg/dL — ABNORMAL HIGH (ref 65–99)
Glucose-Capillary: 160 mg/dL — ABNORMAL HIGH (ref 65–99)

## 2016-01-04 LAB — VITAMIN B12: Vitamin B-12: 763 pg/mL (ref 180–914)

## 2016-01-04 MED ORDER — INSULIN ASPART 100 UNIT/ML ~~LOC~~ SOLN
0.0000 [IU] | Freq: Three times a day (TID) | SUBCUTANEOUS | Status: DC
  Administered 2016-01-04: 1 [IU] via SUBCUTANEOUS
  Administered 2016-01-04: 3 [IU] via SUBCUTANEOUS
  Administered 2016-01-05: 2 [IU] via SUBCUTANEOUS
  Administered 2016-01-05 – 2016-01-07 (×4): 1 [IU] via SUBCUTANEOUS
  Administered 2016-01-07 – 2016-01-08 (×4): 2 [IU] via SUBCUTANEOUS

## 2016-01-04 MED ORDER — CARVEDILOL 6.25 MG PO TABS
6.2500 mg | ORAL_TABLET | Freq: Two times a day (BID) | ORAL | Status: DC
  Administered 2016-01-06 – 2016-01-08 (×5): 6.25 mg via ORAL
  Filled 2016-01-04 (×6): qty 1

## 2016-01-04 MED ORDER — COLLAGENASE 250 UNIT/GM EX OINT
TOPICAL_OINTMENT | Freq: Every day | CUTANEOUS | Status: DC
  Administered 2016-01-04: 14:00:00 via TOPICAL
  Administered 2016-01-05: 1 via TOPICAL
  Administered 2016-01-06 – 2016-01-08 (×3): via TOPICAL
  Filled 2016-01-04: qty 30

## 2016-01-04 MED ORDER — LORAZEPAM 2 MG/ML IJ SOLN
1.0000 mg | Freq: Once | INTRAMUSCULAR | Status: AC
  Administered 2016-01-05: 1 mg via INTRAVENOUS

## 2016-01-04 MED ORDER — VALPROATE SODIUM 500 MG/5ML IV SOLN
1000.0000 mg | Freq: Once | INTRAVENOUS | Status: AC
  Administered 2016-01-05: 1000 mg via INTRAVENOUS
  Filled 2016-01-04: qty 10
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
## 2016-01-04 NOTE — Progress Notes (Signed)
Spoke with wife, patient is not at baseline. She states patient is normally like "we" are. Joslyn Hy, MSN, RN, [employer]
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
## 2016-01-05 ENCOUNTER — Inpatient Hospital Stay (HOSPITAL_COMMUNITY): Payer: Medicaid Other

## 2016-01-05 DIAGNOSIS — E162 Hypoglycemia, unspecified: Secondary | ICD-10-CM

## 2016-01-05 LAB — GLUCOSE, CAPILLARY
Glucose-Capillary: 154 mg/dL — ABNORMAL HIGH (ref 65–99)
Glucose-Capillary: 144 mg/dL — ABNORMAL HIGH (ref 65–99)
Glucose-Capillary: 165 mg/dL — ABNORMAL HIGH (ref 65–99)
Glucose-Capillary: 143 mg/dL — ABNORMAL HIGH (ref 65–99)
Glucose-Capillary: 122 mg/dL — ABNORMAL HIGH (ref 65–99)

## 2016-01-05 LAB — CSF CELL COUNT WITH DIFFERENTIAL
RBC Count, CSF: 14 /mm3 — ABNORMAL HIGH
Tube #: 3
WBC, CSF: 4 /mm3 (ref 0–5)

## 2016-01-05 LAB — BASIC METABOLIC PANEL
Anion gap: 10 (ref 5–15)
BUN: 33 mg/dL — ABNORMAL HIGH (ref 6–20)
CO2: 21 mmol/L — ABNORMAL LOW (ref 22–32)
Calcium: 8 mg/dL — ABNORMAL LOW (ref 8.9–10.3)
Chloride: 112 mmol/L — ABNORMAL HIGH (ref 101–111)
Creatinine, Ser: 1.9 mg/dL — ABNORMAL HIGH (ref 0.61–1.24)
GFR calc Af Amer: 41 mL/min — ABNORMAL LOW (ref >60)
GFR calc non Af Amer: 36 mL/min — ABNORMAL LOW (ref >60)
Glucose, Bld: 194 mg/dL — ABNORMAL HIGH (ref 65–99)
Potassium: 3.9 mmol/L (ref 3.5–5.1)
Sodium: 143 mmol/L (ref 135–145)

## 2016-01-05 LAB — PROTEIN AND GLUCOSE, CSF
Glucose, CSF: 106 mg/dL — ABNORMAL HIGH (ref 40–70)
Total  Protein, CSF: 73 mg/dL — ABNORMAL HIGH (ref 15–45)

## 2016-01-05 LAB — VALPROIC ACID LEVEL: Valproic Acid Lvl: 13 ug/mL — ABNORMAL LOW (ref 50.0–100.0)

## 2016-01-05 LAB — RPR: RPR Ser Ql: NONREACTIVE

## 2016-01-05 MED ORDER — LACOSAMIDE 200 MG/20ML IV SOLN
200.0000 mg | Freq: Two times a day (BID) | INTRAVENOUS | Status: DC
  Administered 2016-01-05 – 2016-01-06 (×4): 200 mg via INTRAVENOUS
  Filled 2016-01-05 (×9): qty 20

## 2016-01-05 MED ORDER — LORAZEPAM 2 MG/ML IJ SOLN: 1.0000 mg | INTRAMUSCULAR | Status: DC | PRN

## 2016-01-05 MED ORDER — VALPROATE SODIUM 500 MG/5ML IV SOLN
1500.0000 mg | Freq: Once | INTRAVENOUS | Status: AC
  Administered 2016-01-05: 1500 mg via INTRAVENOUS
  Filled 2016-01-05: qty 15

## 2016-01-05 MED ORDER — LORAZEPAM 2 MG/ML IJ SOLN
INTRAMUSCULAR | Status: AC
  Filled 2016-01-05: qty 1

## 2016-01-05 MED ORDER — LORAZEPAM 2 MG/ML IJ SOLN
2.0000 mg | Freq: Once | INTRAMUSCULAR | Status: AC
  Administered 2016-01-05: 2 mg via INTRAVENOUS
  Filled 2016-01-05: qty 1

## 2016-01-05 MED ORDER — LORAZEPAM 2 MG/ML IJ SOLN
1.0000 mg | Freq: Once | INTRAMUSCULAR | Status: AC
  Administered 2016-01-05: 1 mg via INTRAVENOUS
  Filled 2016-01-05: qty 1

## 2016-01-05 MED ORDER — VALPROATE SODIUM 500 MG/5ML IV SOLN
500.0000 mg | Freq: Three times a day (TID) | INTRAVENOUS | Status: DC
  Administered 2016-01-05 – 2016-01-07 (×6): 500 mg via INTRAVENOUS
  Filled 2016-01-05 (×8): qty 5

## 2016-01-05 MED ORDER — ENOXAPARIN SODIUM 40 MG/0.4ML ~~LOC~~ SOLN
40.0000 mg | SUBCUTANEOUS | Status: DC
  Administered 2016-01-06 – 2016-01-08 (×3): 40 mg via SUBCUTANEOUS
  Filled 2016-01-05 (×3): qty 0.4

## 2016-01-05 NOTE — Progress Notes (Signed)
OT Cancellation Note  Patient Details Name: Mark [name] MRN: [id number] DOB: April 09, 1951   Cancelled Treatment:    Reason Eval/Treat Not Completed: Medical issues which prohibited therapy. Per nsg, pt having seizure activity. Will attempt later or tomorrow.   [place], OTR/L  [id number] 01/05/2016 01/05/2016, 8:38 AM

## 2016-01-05 NOTE — Progress Notes (Signed)
vLTM EEG running. Event button tested. No skin breakdown. Educated nurse
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
## 2016-01-05 NOTE — Progress Notes (Signed)
Routine EEG completed, results pending. 

## 2016-01-05 NOTE — Progress Notes (Signed)
Subjective: Continues to be encephaloapthic, concern for seizure this morning  Exam: Filed Vitals:   01/05/16 0603 01/05/16 0918  BP: 121/57   Pulse: 84   Temp: 99.8 F (37.7 C) 98.8 F (37.1 C)  Resp: 17    Gen: In bed, NAD Resp: non-labored breathing, no acute distress Abd: soft, nt  Neuro: MS: eyes are open, does nto fixate or track. Does not follow commands or engage with examiner.  [id number], Left gaze preference, but does track across midline, blinks to threat from the left but not right.  Motor: moves left side more than right, localizes with left, withdraws with right.  Sensory:as above  Pertinent Labs:   Impression: 64 yo M with altered mental status following cocaine use and hypoglycemia. He was 53 on arrival, but I do wonder if he had been lower at some other period prior to it being unrecognized. The DWI change could represent hypoglycemic injury.  EEG shows PLEDs but no framk seizures, this pattern is assocaited with injury, but can also be present interictally with frequent seizures and therefore will continue monitoring for now. I will also treat more aggressively.   Recommendations: 1) Ativan 2mg  x1 2) Depacon 1.5 gm additional dose followed by 500mg  TID 3) continue vimpat 200mg  BID 4) will follow.   Roland Rack, MD [facility] [phone]  If 7pm- 7am, please page neurology on call as listed in [place].

## 2016-01-05 NOTE — Progress Notes (Signed)
Pt was having seizures at frequent intervals that last for couple of minutes, initially it was only the facial muscles that was twitching but with the subsequent episodes the entire body was twitching, physician on call was informed called me on phone to page neurologist on call who then call me after i paged him about the pt condition, gave me verbal orders on phone, all carried out will continue to monitor pt

## 2016-01-05 NOTE — Progress Notes (Signed)
Inpatient Diabetes Program Recommendations  AACE/ADA: New Consensus Statement on Inpatient Glycemic Control (2015)  Target Ranges:  Prepandial:   less than 140 mg/dL      Peak postprandial:   less than 180 mg/dL (1-2 hours)      Critically ill patients:  140 - 180 mg/dL  Results for PAM, SEATS (MRN [id number]) as of 01/05/2016 11:33  Ref. Range 01/03/2016 12:12 01/03/2016 17:32 01/03/2016 22:09 01/04/2016 08:08 01/04/2016 12:26 01/04/2016 23:27 01/05/2016 07:48 01/05/2016 09:12  Glucose-Capillary Latest Ref Range: 65-99 mg/dL 172 (H) 200 (H) 179 (H) 214 (H) 134 (H) 160 (H) 154 (H) 144 (H)  Results for SOLON, BELLMAN (MRN [id number]) as of 01/05/2016 11:33  Ref. Range 01/03/2016 12:12 01/03/2016 17:32 01/03/2016 22:09 01/04/2016 08:08 01/04/2016 12:26 01/04/2016 23:27 01/05/2016 07:48 01/05/2016 09:12  Glucose-Capillary Latest Ref Range: 65-99 mg/dL 172 (H) 200 (H) 179 (H) 214 (H) 134 (H) 160 (H) 154 (H) 144 (H)   Review of Glycemic Control  Diabetes history: DM Type 2 Outpatient Diabetes medications: Glipizide 10 mg q d + Lantus 10 units daily Current orders for Inpatient glycemic control: Novolog correction sensitive 0-9 units tid + 0-5 units q hs  Inpatient Diabetes Program Recommendations:  Note hypoglycemia. Will continue to monitor.  Thank you, Nani Gasser. Dawan Farney, RN, MSN, CDE Inpatient Glycemic Control Team Team Pager [phone] (8am-5pm) 01/05/2016 11:38 AM

## 2016-01-05 NOTE — Procedures (Signed)
History: 65 yo M with AMS  Sedation: Ativan given earlier  Technique: This is a 21 channel routine scalp EEG performed at the bedside with bipolar and monopolar montages arranged in accordance to the international 10/20 system of electrode placement. One channel was dedicated to EKG recording.    Background: There are bifrontally synchronus periodic discharges which vary from epileptiform appearing to delta or theta morphology. These do not have a triphasic morphology. There is no evolution in frequency, occurring at a rate of 0.5 - 1 Hz. There is during wakefullness a posterior rhythm of 7 Hz which is poorly sustained. There is also generalized irregulra theta and delta activity.   Photic stimulation: Physiologic driving is not performed  EEG Abnormalities: 1) Bifrontal periodic epileptiform discharges 2) generalized irregular delta activity.   Clinical Interpretation: This EEG is consistent with an area of cortical irritability and potential seizure focus in the frontal region. I supect that these represent generalized periodic epileptiform discharges(GPEDs), though periodic lateralized epileptiform discharges(PLEDs) with rapid spread through the anterior commissure rather than GPEDs are possible. These could be consistent with structural injury(e.g. Hypoglycemic injury), infection, or post-ictal finding. There is no clear evolution to suggest ongoing seizure.   Roland Rack, MD [facility] [phone]  If 7pm- 7am, please page neurology on call as listed in [place].

## 2016-01-05 NOTE — Procedures (Signed)
Indication: Abnormal MRI  Risks of the procedure were dicussed with the patient including post-LP headache, bleeding, infection, weakness/numbness of legs(radiculopathy), death.  The patient/patient's proxy agreed and written consent was obtained.   The patient was prepped and draped, and using sterile technique a 20 gauge quinke spinal needle was inserted in the L5-S1 space. Bony resistance was repeatedly met. The needle was then inserted at L4-5 with return of initially blood tinged CSF that quickly cleared. The opening pressure was 10 cm H2O. Approximately 8 cc of CSF were obtained and sent for analysis.    Roland Rack, MD [facility] [phone]  If 7pm- 7am, please page neurology on call as listed in [place].

## 2016-01-05 NOTE — Progress Notes (Signed)
PT Cancellation Note  Patient Details Name: Mark [name] MRN: [id number] DOB: 1951/02/11   Cancelled Treatment:    Reason Eval/Treat Not Completed: Medical issues which prohibited therapyPt with multiple seizures today and not medically stable for therapy today per RN. PT will continue to follow.    [place] April, PTA Pager: [phone]   01/05/2016, 2:15 PM

## 2016-01-06 DIAGNOSIS — E162 Hypoglycemia, unspecified: Secondary | ICD-10-CM | POA: Insufficient documentation

## 2016-01-06 LAB — VALPROIC ACID LEVEL: Valproic Acid Lvl: 21 ug/mL — ABNORMAL LOW (ref 50.0–100.0)

## 2016-01-06 LAB — GLUCOSE, CAPILLARY
Glucose-Capillary: 115 mg/dL — ABNORMAL HIGH (ref 65–99)
Glucose-Capillary: 134 mg/dL — ABNORMAL HIGH (ref 65–99)
Glucose-Capillary: 151 mg/dL — ABNORMAL HIGH (ref 65–99)
Glucose-Capillary: 96 mg/dL (ref 65–99)

## 2016-01-06 NOTE — Progress Notes (Signed)
PROGRESS NOTE  Mark [name]  [id number] DOB: May 10, 1951  DOA: 01/01/2016 PCP: Minerva Ends, MD  Outpatient Specialists:  Not known  Brief Narrative:  65 year old male patient with PMH of type II DM, HTN, HLD, GERD, stage III chronic kidney disease, chronic combined systolic and diastolic CHF, splenectomy, presented to [facility] ED on 01/01/16 after being found unresponsive and with hypoglycemia (unclear duration). EMS found CBG of 53. 2 episodes of hypoglycemia on day prior. In ED, UDS positive for cocaine, UA negative, worsened creatinine, chest x-ray negative, CT head negative, blood alcohol level <5. Admitted to stepdown for further management. Transferred to medical bed 5/6. Abnormal MRI brain 5/7. Seizure like activity noted 5/8. Neurology consulting and wonder if patient had more prolonged hypoglycemic event prior to being found. MRI brain could represent hypoglycemic injury or could represent sequelae of seizure activity. LP unremarkable. Started AEDs. Slightly improved.   Assessment & Plan:   Principal Problem:   Acute encephalopathy Active Problems:   S/P BKA (below knee amputation) (HCC)   Essential hypertension   Chronic combined systolic and diastolic congestive heart failure (HCC)   Acute renal failure superimposed on stage 3 chronic kidney disease (HCC)   Diabetes mellitus, type 2 (HCC)   Malnutrition of moderate degree (HCC)   GERD (gastroesophageal reflux disease)   Hypothermia   Hypokalemia   History of Clostridium difficile colitis   Wound infection ([place])   Acute encephalopathy/seizure like activity. - Etiology possibly multifactorial: Hypoglycemia related to poor oral intake/oral hypoglycemics and worsening renal functions, cocaine abuse. Low index of suspicion for infectious etiology. - CT head: No acute findings. Chest x-ray: No acute findings. UA: Not suggestive of UTI. - UDS: Positive for cocaine. - Treated underlying cause i.e. hypoglycemia and acute kidney  injury. - He denied all substance abuse. - TSH 1.134. HIV antibody nonreactive on 10/17/14. - Despite about treatment, mental status did not significantly improve. MRI brain abnormal with results as below.  - Developed seizure-like activity on 5/8. Abnormal EEG. Neurology consulted and started AEDs. - Neurology follow-up appreciated and wonder if patient had more prolonged hypoglycemic event prior to being found. MRI brain could represent hypoglycemic injury or could represent sequelae of seizure activity. LP unremarkable. - Mental status slightly better today.   Poorly controlled type II DM with renal complications and hypoglycemia -Hypoglycemia likely secondary to oral hypoglycemics complicating poor oral intake and worsening renal functions - Glipizide held. - Briefly treated with IV D10 infusion. Hypoglycemia resolved.. Diet resumed.  - Started NovoLog SSI. - Check hemoglobin A1c: 8.9. - As per pharmacy review, only on oral hypoglycemics at home and not on insulin's. Fluctuating and mildly uncontrolled.  Essential hypertension, uncontrolled - Resumed home dose of carvedilol. When necessary IV hydralazine. - Hold lisinopril secondary to acute kidney injury. - Increased dose of carvedilol.Blood pressure fluctuating.   Acute on stage III chronic kidney disease - Worsened by poor oral intake, dehydration and ACEI. Hold lisinopril temporarily. - Hydrated with IV fluids. Creatinine has plateaued in the 2 range. Discontinued IV fluids. - Last creatinine 08/15/15:1.44. Creatinine has improved and plateaued in the 1.8-1.9 range.   GERD - PPI.  Hyperlipidemia - Statins. LDL 82, HDL 21.  Chronic combined systolic and diastolic CHF - 2-D echo 99991111: LVEF 45-50 percent and grade 2 diastolic dysfunction. Treated for dehydration on admission. Follow 2-D echo results-as below. EF normal.  Elevated troponin - Likely demand ischemia related to hypoglycemia, dehydration, acute kidney injury and  cocaine abuse. - Continue aspirin. -  Flat troponin trend. No chest pain reported. EKG shows mild sinus tachycardia without acute changes. Baseline tremor artifact. - 2-D echo-unremarkable/normal EF-detailed report as below. - Continue carvedilol.  Overweight - As per dietitian input, meets criteria for overweight.  Hypokalemia - Replaced.   History of C. difficile colitis/questionable diarrhea - Monitor for now. No diarrhea reported.  Left BKA with small stump abscess - Wound consultation appreciated. No overt features suggestive of infection. Discontinue empirically started IV antibiotics. - Lactate normal. - On 5/6, noted on later aspect of BKA stump approximately 1.5 cm diameter fluctuant area with tiny opening draining white/purulent discharge. Suspicious for small abscess. Consulted Dr. Sharol Given: Ulcer over anterior lateral aspect of left leg, treat with Santyl. No surgical indications. Follow-up in office with Dr. Sharol Given . Complete 1 week of oral doxycycline.  Anemia - Stable in the 9 g per DL range.  Microscopic hematuria - Outpatient evaluation and follow-up as deemed necessary.  Cocaine abuse - Patient denies but UDS positive.   DVT prophylaxis: Lovenox  Code Status: Full  Family Communication: Discussed with patient's spouse 5/8. Updated care and answered questions. None at bedside today. Disposition Plan: Admitted to stepdown unit. Transferred to medical floor 5/6. Not medically stable for discharge.   Consultants:   [place]  [facility]   Procedures:   EEG 01/05/16: EEG Abnormalities: 1) Bifrontal periodic epileptiform discharges 2) generalized irregular delta activity.   Clinical Interpretation: This EEG is consistent with an area of cortical irritability and potential seizure focus in the frontal region. I supect that these represent generalized periodic epileptiform discharges(GPEDs), though periodic lateralized epileptiform discharges(PLEDs) with rapid  spread through the anterior commissure rather than GPEDs are possible. These could be consistent with structural injury(e.g. Hypoglycemic injury), infection, or post-ictal finding. There is no clear evolution to suggest ongoing seizure.    2-D echo 01/03/16: Study Conclusions  - Left ventricle: The cavity size was normal. Wall thickness was  increased in a pattern of moderate LVH. Systolic function was  normal. The estimated ejection fraction was in the range of 60%  to 65%. Wall motion was normal; there were no regional wall  motion abnormalities. Doppler parameters are consistent with  abnormal left ventricular relaxation (grade 1 diastolic  dysfunction). - Aortic valve: Nodular calcification of the non coronary cusp. - Left atrium: The atrium was mildly dilated. - Atrial septum: No defect or patent foramen ovale was identified.   Antimicrobials:   IV Zosyn 5/4 >discontinued  IV vancomycin 5/4 >discontinued   Doxycycline 5/6 >   Subjective: Not restless this morning. Nonverbal. However following simple instructions-raising hands, opening mouth, sticking out tongue  Objective:  Filed Vitals:   01/06/16 0344 01/06/16 0540 01/06/16 0541 01/06/16 1422  BP: 175/91  171/96 158/81  Pulse: 74  85 84  Temp: 98.3 F (36.8 C)  97.7 F (36.5 C) 97.6 F (36.4 C)  TempSrc: Oral  Oral Oral  Resp: 18  17 16   Height:      Weight:  94.121 kg (207 lb 8 oz)    SpO2: 96%  93% 96%    Intake/Output Summary (Last 24 hours) at 01/06/16 1450 Last data filed at 01/06/16 1319  Gross per 24 hour  Intake    630 ml  Output   1325 ml  Net   -695 ml   Filed Weights   01/04/16 0602 01/05/16 0603 01/06/16 0540  Weight: 98.5 kg (217 lb 2.5 oz) 93.7 kg (206 lb 9.1 oz) 94.121 kg (207 lb 8 oz)  Examination:  General exam: Pleasant middle-aged male lying supine in bed, Appears improved compared to 5/8. Respiratory system: Clear to auscultation. Respiratory effort normal. Cardiovascular  system: S1 & S2 heard, RRR.Marland Kitchen No JVD, murmurs, rubs, gallops or clicks. No pedal edema. Gastrointestinal system: Abdomen is nondistended, soft and nontender. No organomegaly or masses felt. Normal bowel sounds heard. Central nervous system: Alert, nonverbal but follow some instructions. No focal neurological deficits. Extremities: Symmetric 5 x 5 power.Left BKA stump: noted on lateral aspect of BKA stump approximately 1.5 cm diameter fluctuant area with tiny opening draining white/purulent discharge- better with decreased drainage. Skin: No rashes, lesions. Psychiatry: cannot be assessed at this time.     Data Reviewed: I have personally reviewed following labs and imaging studies  CBC:  Recent Labs Lab 01/01/16 1650 01/02/16 0835 01/03/16 0246 01/04/16 0543  WBC 7.7 15.5* 13.1* 13.0*  NEUTROABS 3.5  --   --   --   HGB 11.7* 9.9* 8.7* 9.8*  HCT 34.8* 29.5* 25.4* 29.9*  MCV 87.7 88.9 87.0 86.9  PLT 182 205 220 [id number]   Basic Metabolic Panel:  Recent Labs Lab 01/01/16 1650 01/02/16 0430 01/02/16 0835 01/03/16 0246 01/04/16 0543 01/05/16 0556  NA 142  --  140 137 142 143  K 3.3*  --  3.6 3.6 3.7 3.9  CL 110  --  112* 109 110 112*  CO2 21*  --  19* 19* 19* 21*  GLUCOSE 69  --  149* 208* 205* 194*  BUN 32*  --  28* 28* 24* 33*  CREATININE 2.17*  --  2.02* 1.99* 1.89* 1.90*  CALCIUM 8.7*  --  7.9* 7.6* 8.2* 8.0*  MG  --  1.9  --   --   --   --    GFR: Estimated Creatinine Clearance: 45.7 mL/min (by C-G formula based on Cr of 1.9). Liver Function Tests: No results for input(s): AST, ALT, ALKPHOS, BILITOT, PROT, ALBUMIN in the last 168 hours. No results for input(s): LIPASE, AMYLASE in the last 168 hours. No results for input(s): AMMONIA in the last 168 hours. Coagulation Profile:  Recent Labs Lab 01/02/16 0002  INR 1.00   Cardiac Enzymes:  Recent Labs Lab 01/02/16 0002 01/02/16 0430 01/02/16 0836  TROPONINI 0.09* 0.09* 0.07*   BNP (last 3 results) No results  for input(s): PROBNP in the last 8760 hours. HbA1C: No results for input(s): HGBA1C in the last 72 hours. CBG:  Recent Labs Lab 01/05/16 1202 01/05/16 1716 01/05/16 2159 01/06/16 0836 01/06/16 1126  GLUCAP 165* 143* 122* 134* 96   Lipid Profile: No results for input(s): CHOL, HDL, LDLCALC, TRIG, CHOLHDL, LDLDIRECT in the last 72 hours. Thyroid Function Tests: No results for input(s): TSH, T4TOTAL, FREET4, T3FREE, THYROIDAB in the last 72 hours. Anemia Panel:  Recent Labs  01/04/16 1413  VITAMINB12 763   Urine analysis:    Component Value Date/Time   COLORURINE YELLOW 01/01/2016 1650   APPEARANCEUR CLOUDY* 01/01/2016 1650   LABSPEC 1.020 01/01/2016 1650   PHURINE 5.0 01/01/2016 1650   GLUCOSEU NEGATIVE 01/01/2016 1650   HGBUR LARGE* 01/01/2016 1650   BILIRUBINUR NEGATIVE 01/01/2016 1650   KETONESUR NEGATIVE 01/01/2016 1650   PROTEINUR >300* 01/01/2016 1650   UROBILINOGEN 0.2 04/18/2015 1544   NITRITE NEGATIVE 01/01/2016 1650   LEUKOCYTESUR NEGATIVE 01/01/2016 1650   Sepsis Labs: @LABRCNTIP (procalcitonin:4,lacticidven:4)  ) Recent Results (from the past 240 hour(s))  Culture, blood (x 2)     Status: None (Preliminary result)   Collection Time: 01/02/16  12:02 AM  Result Value Ref Range Status   Specimen Description BLOOD LEFT HAND  Final   Special Requests BOTTLES DRAWN AEROBIC AND ANAEROBIC 5ML  Final   Culture NO GROWTH 3 DAYS  Final   Report Status PENDING  Incomplete  Culture, blood (x 2)     Status: None (Preliminary result)   Collection Time: 01/02/16 12:16 AM  Result Value Ref Range Status   Specimen Description BLOOD LEFT WRIST  Final   Special Requests BOTTLES DRAWN AEROBIC AND ANAEROBIC 5ML  Final   Culture NO GROWTH 3 DAYS  Final   Report Status PENDING  Incomplete  MRSA PCR Screening     Status: None   Collection Time: 01/02/16  3:45 AM  Result Value Ref Range Status   MRSA by PCR NEGATIVE NEGATIVE Final    Comment:        The GeneXpert MRSA  Assay (FDA approved for NASAL specimens only), is one component of a comprehensive MRSA colonization surveillance program. It is not intended to diagnose MRSA infection nor to guide or monitor treatment for MRSA infections.          Radiology Studies: Mr Brain Wo Contrast  01/04/2016  CLINICAL DATA:  Altered mental status. Found unresponsive on couch by wife this afternoon. EXAM: MRI HEAD WITHOUT CONTRAST TECHNIQUE: Multiplanar, multiecho pulse sequences of the brain and surrounding structures were obtained without intravenous contrast. COMPARISON:  Head CT from 3 days ago FINDINGS: Limited by motion, best obtainable with the available sedation Calvarium and upper cervical spine: No focal marrow signal abnormality. Orbits: Negative. Sinuses and Mastoids: Bilateral mastoid effusion. Brain: There is symmetric restricted diffusion of the bifrontal cortex. There is associated swelling and hyperintensity on FLAIR and T1 weighted imaging. There is notable sparing of the insula, posterior cingulate gyri, and temporal lobes. There is no subcortical diffusion abnormality including in the pulvinar region. The left parietal cortex is asymmetrically hyperintense on diffusion-weighted images compared to the right, although this is more subtle and could be related to field inhomogeneity. There is no definite swelling in this region on FLAIR and T1 weighted imaging. Aside from the cortical abnormality, brain appearance is typical for age with mild microvascular ischemic change in the cerebral white matter and normal cerebral volume. No evidence of major vessel occlusion. No hemorrhage, hydrocephalus, or shift. These results were called by telephone at the time of interpretation on 01/04/2016 at 7:07 pm to Dr. Rachelle Hora, who verbally acknowledged these results. IMPRESSION: 1. Bifrontal cortex abnormality with restricted diffusion and swelling. This is a nonspecific pattern and could be related to seizure phenomenon,  cerebritis (not typical pattern for herpes although CSF correlation should be considered depending on clinical features), or metabolic insult (history of recent hypoglycemia, although this would not be typical pattern). 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 2. Mastoid effusions. 3. Motion degraded study. Electronically Signed   By: Monte Fantasia M.D.   On: 01/04/2016 19:08        Scheduled Meds: . aspirin EC  81 mg Oral Daily  . atorvastatin  20 mg Oral q1800  . carvedilol  6.25 mg Oral BID WC  . collagenase   Topical Daily  . doxycycline  100 mg Oral Q12H  . enoxaparin (LOVENOX) injection  40 mg Subcutaneous Q24H  . insulin aspart  0-5 Units Subcutaneous QHS  . insulin aspart  0-9 Units Subcutaneous TID WC  . lacosamide (VIMPAT) IV  200 mg Intravenous Q12H  .  pantoprazole  40 mg Oral Daily  . pneumococcal 23 valent vaccine  0.5 mL Intramuscular Tomorrow-1000  . sodium chloride flush  3 mL Intravenous Q12H  . thiamine  100 mg Oral Daily   Or  . thiamine  100 mg Intravenous Daily  . valproate sodium  500 mg Intravenous Q8H   Continuous Infusions:     LOS: 4 days    Time spent: 20 minutes.    [facility], MD [facility] Pager [phone]  If 7PM-7AM, please contact night-coverage [email or web address] Password [email or web address] 01/06/2016, 2:50 PM

## 2016-01-06 NOTE — Procedures (Signed)
  Electroencephalogram report- LTM   Ordering Physician :  DR Leonel Ramsay  EEG number: [id number]  Data acquisition: 10-20 electrode placement.  Additional T1, T2, and EKG electrodes; 26 channel digital referential acquisition reformatted to 18 channel/7 channel coronal bipolar   Beginning time:  01/05/16 at 08 44 32 am  Ending time: 01/06/16 at 09 3323 am  Day of study: day 1    This 24 hours of intensive EEG monitoring with simultaneous video monitoring was performed for this patient with decreased responsiveness and encephalopathy as a part of ongoing series to rule out subclinical electrographic seizures to explain his symptoms.    Medications: Insulin thiamine Depakote lorazepam  There was no pushbutton activations events during this recording.    Background activities throughout the recording were marked by predominantly low amplitude delta slowing with admixed faster frequencies in this area and sometimes are full range.  In addition anterior dominant beta frequencies at times present.  Superimposed there is a periodic generalized sharply contoured delta slowing present with frontal dominance with periodicity every 1-2 seconds however without evolving features to suggest ongoing seizures.  This pattern however consistent with generalized periodic epileptiform discharges or GPEDs and typically suggestive of cortical irritability.   EEG was reactive to external stimuli with external stimulation and patient spontaneously and around EEG become faster attenuated with less prominent and generalized periodic epileptiform discharges.  Clinical interpretation: This 24 hours of intensive EEG monitoring with simultaneous monitoring is abnormal due to reactive background activity slowing consistent with a moderate encephalopathy.  In addition presence of generalized periodic epileptiform discharges GPEDs suggestive of cortical irritability, particularly involving frontal cortex.

## 2016-01-06 NOTE — Progress Notes (Signed)
Subjective: LP unremarkable, appears somewhat better today.   Exam: Filed Vitals:   01/06/16 0344 01/06/16 0541  BP: 175/91 171/96  Pulse: 74 85  Temp: 98.3 F (36.8 C) 97.7 F (36.5 C)  Resp: 18 17   Gen: In bed, NAD Resp: non-labored breathing, no acute distress Abd: soft, nt  Neuro: MS: eyes are open, fixates and tracks follows simple commands to wiggle toes, stick out tongue, squeeze hands.  [id number], eyes midline today, blinks to threat bilaterally.  Motor: moves left side more than right, localizes with left, withdraws with right.  Sensory:as above  24 hour EEG, formal read pending but I did not see sz on my spot checks. LP no signs of infectious process.    Impression: 65 yo M with altered mental status following cocaine use and hypoglycemia. He was 53 on arrival, but had not been seen since the day prior, and I wonder if he had a more prolonged hypoglycemic event prior to being found.  The DWI change could represent hypoglycemic injury, or could represent sequelae of seizure activity.   Either way, he is slightly better today. Either way, at this point, I think care will be predominantly supportive and anti-epileptic drugs.   Prognosis is unclear, but he has likely had some injury.   Recommendations: 1) continue depakote 500mg  tid 3) continue vimpat 200mg  BID 4) will follow.   Roland Rack, MD [facility] [phone]  If 7pm- 7am, please page neurology on call as listed in [place].

## 2016-01-07 LAB — COMPREHENSIVE METABOLIC PANEL
ALT: 62 U/L (ref 17–63)
AST: 29 U/L (ref 15–41)
Albumin: 2.4 g/dL — ABNORMAL LOW (ref 3.5–5.0)
Alkaline Phosphatase: 114 U/L (ref 38–126)
Anion gap: 11 (ref 5–15)
BUN: 32 mg/dL — ABNORMAL HIGH (ref 6–20)
CO2: 22 mmol/L (ref 22–32)
Calcium: 8.4 mg/dL — ABNORMAL LOW (ref 8.9–10.3)
Chloride: 113 mmol/L — ABNORMAL HIGH (ref 101–111)
Creatinine, Ser: 1.81 mg/dL — ABNORMAL HIGH (ref 0.61–1.24)
GFR calc Af Amer: 44 mL/min — ABNORMAL LOW (ref >60)
GFR calc non Af Amer: 38 mL/min — ABNORMAL LOW (ref >60)
Glucose, Bld: 159 mg/dL — ABNORMAL HIGH (ref 65–99)
Potassium: 4.1 mmol/L (ref 3.5–5.1)
Sodium: 146 mmol/L — ABNORMAL HIGH (ref 135–145)
Total Bilirubin: 0.7 mg/dL (ref 0.3–1.2)
Total Protein: 6.7 g/dL (ref 6.5–8.1)

## 2016-01-07 LAB — CBC WITH DIFFERENTIAL/PLATELET
Basophils Absolute: 0 10*3/uL (ref 0.0–0.1)
Basophils Relative: 0 %
Eosinophils Absolute: 0.2 10*3/uL (ref 0.0–0.7)
Eosinophils Relative: 2 %
HCT: 30.7 % — ABNORMAL LOW (ref 39.0–52.0)
Hemoglobin: 10 g/dL — ABNORMAL LOW (ref 13.0–17.0)
Lymphocytes Relative: 34 %
Lymphs Abs: 3.6 10*3/uL (ref 0.7–4.0)
MCH: 28.7 pg (ref 26.0–34.0)
MCHC: 32.6 g/dL (ref 30.0–36.0)
MCV: 88 fL (ref 78.0–100.0)
Monocytes Absolute: 0.9 10*3/uL (ref 0.1–1.0)
Monocytes Relative: 8 %
Neutro Abs: 6 10*3/uL (ref 1.7–7.7)
Neutrophils Relative %: 56 %
Platelets: 571 10*3/uL — ABNORMAL HIGH (ref 150–400)
RBC: 3.49 MIL/uL — ABNORMAL LOW (ref 4.22–5.81)
RDW: 14.3 % (ref 11.5–15.5)
WBC: 10.7 10*3/uL — ABNORMAL HIGH (ref 4.0–10.5)

## 2016-01-07 LAB — CULTURE, BLOOD (ROUTINE X 2)
Culture: NO GROWTH
Culture: NO GROWTH

## 2016-01-07 LAB — GLUCOSE, CAPILLARY
Glucose-Capillary: 147 mg/dL — ABNORMAL HIGH (ref 65–99)
Glucose-Capillary: 175 mg/dL — ABNORMAL HIGH (ref 65–99)
Glucose-Capillary: 177 mg/dL — ABNORMAL HIGH (ref 65–99)
Glucose-Capillary: 231 mg/dL — ABNORMAL HIGH (ref 65–99)

## 2016-01-07 LAB — HERPES SIMPLEX VIRUS(HSV) DNA BY PCR
HSV 1 DNA: NEGATIVE
HSV 2 DNA: NEGATIVE

## 2016-01-07 MED ORDER — DIVALPROEX SODIUM 125 MG PO CSDR
500.0000 mg | DELAYED_RELEASE_CAPSULE | Freq: Three times a day (TID) | ORAL | Status: DC
  Administered 2016-01-07 – 2016-01-08 (×4): 500 mg via ORAL
  Filled 2016-01-07 (×4): qty 4

## 2016-01-07 MED ORDER — LORAZEPAM 2 MG/ML IJ SOLN: 1.0000 mg | Freq: Four times a day (QID) | INTRAMUSCULAR | Status: DC | PRN

## 2016-01-07 MED ORDER — LACOSAMIDE 200 MG PO TABS
200.0000 mg | ORAL_TABLET | Freq: Two times a day (BID) | ORAL | Status: DC
  Administered 2016-01-07 – 2016-01-08 (×3): 200 mg via ORAL
  Filled 2016-01-07 (×3): qty 1

## 2016-01-07 NOTE — NC FL2 (Signed)
[place] MEDICAID FL2 LEVEL OF CARE SCREENING TOOL     IDENTIFICATION  Patient Name: Mark [name] Birthdate: 1950-11-03 Sex: male Admission Date (Current Location): 01/01/2016  [facility] [place] Number:  Herbalist and Address:  [facility] [place], [place] [address], [place], [place] [address]      Provider Number: [id number]  Attending Physician Name and Address:  Nita Sells, MD  Relative Name and Phone Number:       Current Level of Care: Hospital Recommended Level of Care: [place] Prior Approval Number:    Date Approved/Denied:   PASRR Number: [id number]  Discharge Plan: SNF    Current Diagnoses: Patient Active Problem List   Diagnosis Date Noted  . Hypoglycemia   . Wound infection ([place]) 01/02/2016  . Acute encephalopathy 01/01/2016  . Hypothermia 01/01/2016  . Hypokalemia 01/01/2016  . History of Clostridium difficile colitis 01/01/2016  . Positive for microalbuminuria 08/18/2015  . CKD (chronic kidney disease) stage 3, GFR 30-59 ml/min 08/18/2015  . GERD (gastroesophageal reflux disease) 04/30/2015  . Tinea pedis 04/30/2015  . Onychomycosis of toenail 04/30/2015  . Malnutrition of moderate degree ([place]) 04/17/2015  . Acute renal failure superimposed on stage 3 chronic kidney disease ([place]) 04/16/2015  . Diabetes mellitus, type 2 ([place]) 04/16/2015  . Chronic combined systolic and diastolic congestive heart failure ([place]) 10/18/2014  . Elevated troponin 10/16/2014  . Essential hypertension 04/08/2014  . Phantom limb pain ([place]) 12/12/2013  . S/P BKA (below knee amputation) ([place]) 11/21/2013  . Anemia 10/01/2013  . DM type 2 causing vascular disease ([place]) 09/26/2013    Orientation RESPIRATION BLADDER Height & Weight      (mainly unresponsive at this time)  Normal Incontinent, External catheter Weight: 207 lb 8 oz (94.121 kg) Height:  6\' 2"  (188 cm)  BEHAVIORAL SYMPTOMS/MOOD NEUROLOGICAL BOWEL NUTRITION  STATUS      Incontinent Diet (carb modified)  AMBULATORY STATUS COMMUNICATION OF NEEDS Skin   Extensive Assist Verbally (attempts to communicate verbally) Other (Comment) (left leg blister)                       Personal Care Assistance Level of Assistance  Bathing, Dressing, Feeding Bathing Assistance: Maximum assistance Feeding assistance: Maximum assistance Dressing Assistance: Maximum assistance     Functional Limitations Info  Speech (currently non-verbal)     Speech Info: Impaired (speaks with difficulty)    SPECIAL CARE FACTORS FREQUENCY  PT (By licensed PT), OT (By licensed OT)                    Contractures      Additional Factors Info  Code Status, Allergies, Insulin Sliding Scale Code Status Info: FULL Allergies Info: NKA   Insulin Sliding Scale Info: 4/day       Current Medications (01/07/2016):  This is the current hospital active medication list Current Facility-Administered Medications  Medication Dose Route Frequency Provider Last Rate Last Dose  . acetaminophen (TYLENOL) tablet 650 mg  650 mg Oral Q6H PRN Ivor Costa, MD       Or  . acetaminophen (TYLENOL) suppository 650 mg  650 mg Rectal Q6H PRN Ivor Costa, MD   650 mg at 01/01/16 2337  . aspirin EC tablet 81 mg  81 mg Oral Daily Modena Jansky, MD   81 mg at 01/07/16 1023  . atorvastatin (LIPITOR) tablet 20 mg  20 mg Oral q1800 Modena Jansky, MD  20 mg at 01/06/16 1834  . carvedilol (COREG) tablet 6.25 mg  6.25 mg Oral BID WC Modena Jansky, MD   6.25 mg at 01/07/16 1023  . collagenase (SANTYL) ointment   Topical Daily Meridee Score V, MD      . dextrose 50 % solution 25 mL  25 mL Intravenous PRN Ivor Costa, MD      . divalproex (DEPAKOTE SPRINKLE) capsule 500 mg  500 mg Oral Q8H Jai-Gurmukh Samtani, MD      . doxycycline (VIBRA-TABS) tablet 100 mg  100 mg Oral Q12H Modena Jansky, MD   100 mg at 01/07/16 1023  . enoxaparin (LOVENOX) injection 40 mg  40 mg Subcutaneous Q24H Greta Doom, MD   40 mg at 01/07/16 1023  . hydrALAZINE (APRESOLINE) injection 5 mg  5 mg Intravenous Q2H PRN Ivor Costa, MD   5 mg at 01/04/16 2328  . insulin aspart (novoLOG) injection 0-5 Units  0-5 Units Subcutaneous QHS Modena Jansky, MD   3 Units at 01/02/16 2040  . insulin aspart (novoLOG) injection 0-9 Units  0-9 Units Subcutaneous TID WC Modena Jansky, MD   1 Units at 01/07/16 1026  . lacosamide (VIMPAT) tablet 200 mg  200 mg Oral BID Nita Sells, MD      . LORazepam (ATIVAN) injection 1 mg  1 mg Intravenous Q6H PRN Nita Sells, MD      . ondansetron (ZOFRAN) tablet 4 mg  4 mg Oral Q6H PRN Ivor Costa, MD       Or  . ondansetron [facility]) injection 4 mg  4 mg Intravenous Q6H PRN Ivor Costa, MD      . pantoprazole (PROTONIX) EC tablet 40 mg  40 mg Oral Daily Modena Jansky, MD   40 mg at 01/07/16 1023  . pneumococcal 23 valent vaccine (PNU-IMMUNE) injection 0.5 mL  0.5 mL Intramuscular Tomorrow-1000 Modena Jansky, MD      . sodium chloride flush (NS) 0.9 % injection 3 mL  3 mL Intravenous Q12H Ivor Costa, MD   3 mL at 01/07/16 1000  . thiamine (VITAMIN B-1) tablet 100 mg  100 mg Oral Daily Sherwood Gambler, MD   100 mg at 01/07/16 1022   Or  . thiamine (B-1) injection 100 mg  100 mg Intravenous Daily Sherwood Gambler, MD   100 mg at 01/01/16 2115     Discharge Medications: Please see discharge summary for a list of discharge medications.  Relevant Imaging Results:  Relevant Lab Results:   Additional Information SS#: [id number]  [place] Mon, [place]

## 2016-01-07 NOTE — Care Management Note (Signed)
Case Management Note  Patient Details  Name: CAPTAIN TASHJIAN MRN: [id number] Date of Birth: [birth year]  Subjective/Objective:                 Patient admitted with acute encephalopathy, possible seizure. IV anti epileptics x 2. Weak, Pt rec SNF.   Action/Plan:  Anticipate DC to SNF.  Expected Discharge Date:                  Expected Discharge Plan:  Skilled Nursing Facility  In-House Referral:  Clinical Social Work  Discharge planning Services  CM Consult  Post Acute Care Choice:    Choice offered to:     DME Arranged:    DME Agency:     HH Arranged:    [place] Agency:     Status of Service:  In process, will continue to follow  Medicare Important Message Given:    Date Medicare IM Given:    Medicare IM give by:    Date Additional Medicare IM Given:    Additional Medicare Important Message give by:     If discussed at [place] of Stay Meetings, dates discussed:    Additional Comments:  Carles Collet, RN 01/07/2016, 4:17 PM
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
## 2016-01-07 NOTE — Progress Notes (Signed)
Subjective: Awake and able to count fingers and follow some simple commands.   Exam: Filed Vitals:   01/07/16 0516 01/07/16 0519  BP:  174/85  Pulse:  69  Temp: 97.8 F (36.6 C) 97.8 F (36.6 C)  Resp:       Gen: In bed, NAD MS: alert, able to follow simple commands but takes a long time to initiate movements and answer. fixates and tracks follows simple commands to wiggle toes, stick out tongue, squeeze hands.  [id number], eyes midline today, blinks to threat bilaterally and able to count fingers.   Motor: moves all extremities wekk Sensory:as above   Etta Quill PA-C [facility] [phone]  Tells me his name today, and answers simple quesitons.   Impression: 65 yo M with altered mental status following cocaine use and hypoglycemia. He was 53 on arrival, but had not been seen since the day prior, and I wonder if he had a more prolonged hypoglycemic event prior to being found. The DWI change could represent hypoglycemic injury, or could represent sequelae of seizure activity.   At this point, he is continuing to improve. I would continue two antiepileptic drugs for now given how refratory the seizures initially were, but may not need two drugs long term.   Recommendations: 1) f/u with outpatient neurology for repeat MRI, EEG prior to consideration of consolidating AED regimen.  2) continue vimpat, depakote 3) no further recommendations at this time, but we are available if needed. Please call with further questions or concerns.   Roland Rack, MD [facility] [phone]  If 7pm- 7am, please page neurology on call as listed in [place].   01/07/2016, 11:25 AM

## 2016-01-07 NOTE — Progress Notes (Signed)
PROGRESS NOTE  Mark [name]  [id number] DOB: Jul 01, 1951  DOA: 01/01/2016 PCP: Mark Ends, MD  Outpatient Specialists:  Not known  Brief Narrative:  42-year-? Ty II DM,  HTN,  HLD,  GERD,  stage III chronic kidney disease,  chronic combined systolic and diastolic CHF, s Plenectomy,  presented 01/01/16 after being found unresponsive and with hypoglycemia (unclear duration).  EMS found CBG of 53. 2 episodes of hypoglycemia on day prior.  In ED, UDS positive for cocaine, UA negative, worsened creatinine, chest x-ray negative, CT head negative, blood alcohol level <5.  Admitted to stepdown for further management.  Transferred to medical bed 5/6.  Abnormal MRI brain 5/7.  Seizure like activity noted 5/8. Neurology consulting .  MRI brain could represent hypoglycemic injury or could represent sequelae of seizure activity. LP unremarkable.  Started AEDs. Slightly improved   Assessment & Plan:   Principal Problem:   Acute encephalopathy Active Problems:   S/P BKA (below knee amputation) (HCC)   Essential hypertension   Chronic combined systolic and diastolic congestive heart failure (HCC)   Acute renal failure superimposed on stage 3 chronic kidney disease (HCC)   Diabetes mellitus, type 2 (HCC)   Malnutrition of moderate degree (HCC)   GERD (gastroesophageal reflux disease)   Hypothermia   Hypokalemia   History of Clostridium difficile colitis   Wound infection ([place])   Hypoglycemia   Acute encephalopathy/seizure like activity. - Etiology possibly multifactorial: Hypoglycemia related to poor oral intake/oral hypoglycemics and worsening renal functions, cocaine abuse. Low index of suspicion for infectious etiology. - CT head: No acute findings. Chest x-ray: No acute findings. UA: Not suggestive of UTI. - UDS: Positive for cocaine. - Treated underlying cause i.e. hypoglycemia and acute kidney injury. - He denied all substance abuse - TSH 1.134. HIV antibody  nonreactive on 10/17/14. - Despite about treatment, mental status did not significantly improve. MRI brain abnormal with results as below.  - Developed seizure-like activity on 5/8. Abnormal EEG. Neurology consulted and started AEDs. -showing more purposeful movement 5/10--moving UE's and said "yes and no" to s-i-law 5/9 - Neurology follow-up appreciated  LP unremarkable. -? 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 - Briefly treated with IV D10 infusion. Hypoglycemia resolved - Diet resumed-Feeder-RN made aware  - Started NovoLog SSI-ranging 147-159 - Check hemoglobin A1c: 8.9. - Only on oral hypoglycemics at home and not on insulin's.   Essential hypertension, uncontrolled - Resumed carvedilol 3.125 [home dose]--> 6.25 bid. When necessary IV hydralazine. - Hold lisinopril secondary to acute kidney injury.  Acute on stage III chronic kidney disease, component cocaine nephropathy as well - Worsened by poor oral intake, dehydration and ACEI. Hold lisinopril temporarily. - Hydrated with IV fluids. Creatinine has plateaued in the 2 range. Discontinued IV fluids. - Last creatinine 08/15/15:1.44. Creatinine has improved and plateaued in the 1.8-1.9 range.   GERD - PPI.  Hyperlipidemia - Statins. LDL 82, HDL 21.  Chronic combined systolic and diastolic CHF - 2-D echo 99991111: LVEF 45-50 percent and grade 2 diastolic dysfunction. - Treated for dehydration on admission.  - 2-D echo results EF normal.  Elevated troponin - Likely demand ischemia related to hypoglycemia, dehydration, acute  kidney injury and cocaine abuse. - Continue aspirin. - Flat troponin  trend. No chest pain reported. EKG shows mild sinus tachycardia without acute changes. Baseline tremor artifact. - 2-D echo-unremarkable/normal EF-detailed report as below. - Continue carvedilol.  Overweight, Body mass index is 26.63 kg/(m^2). - As per dietitian input, meets criteria for overweight.  Hypokalemia - Replaced.   History of C. difficile colitis/questionable diarrhea - Monitor for now. No diarrhea reported.   Left BKA with small stump abscess - Wound consultation appreciated. No overt features suggestive of infection. Discontinue empirically started IV antibiotics. - Lactate normal. - On 5/6, noted on later aspect of BKA stump approximately 1.5 cm diameter fluctuant area with tiny opening draining white/purulent discharge. Suspicious for small abscess.  -Consulted Dr. Sharol [name]: Ulcer over anterior lateral aspect of left leg, treat with Santyl.  -No surgical indications. - Follow-up in office with Dr. Sharol [name] . Complete 1 week of oral doxycycline on 01/09/16.  Anemia - Stable in the 9 g per DL range.  Microscopic hematuria - Outpatient evaluation and follow-up as deemed necessary.  Cocaine abuse - Patient denies but UDS positive.   DVT prophylaxis: Lovenox  Code Status: Full  Family Communication: no family +, Wife is Mark [name] [phone] Disposition Plan:  stepdown unit--> medical floor 5/6. Not medically stable for discharge.   Consultants:   [facility]   Procedures:   EEG 01/05/16: EEG Abnormalities: 1) Bifrontal periodic epileptiform discharges 2) generalized irregular delta activity.   Clinical Interpretation: This EEG is consistent with an area of cortical irritability and potential seizure focus in the frontal region. I supect that these represent generalized periodic epileptiform discharges(GPEDs), though periodic lateralized epileptiform discharges(PLEDs) with rapid spread through the anterior commissure rather than GPEDs are possible.  These could be consistent with structural injury(e.g. Hypoglycemic injury), infection, or post-ictal finding. There is no clear evolution to suggest ongoing seizure.    2-D echo 01/03/16: Study Conclusions  - Left ventricle: The cavity size was normal. Wall thickness was  increased in a pattern of moderate LVH. Systolic function was  normal. The estimated ejection fraction was in the range of 60%  to 65%. Wall motion was normal; there were no regional wall  motion abnormalities. Doppler parameters are consistent with  abnormal left ventricular relaxation (grade 1 diastolic  dysfunction). - Aortic valve: Nodular calcification of the non coronary cusp. - Left atrium: The atrium was mildly dilated. - Atrial septum: No defect or patent foramen ovale was identified.   Antimicrobials:   IV Zosyn 5/4 >discontinued  IV vancomycin 5/4 >discontinued   Doxycycline 5/6 >   Subjective: Nonverbal.  However following simple instructions-raising hands, opening mouth, sticking out tongue Spoke last pm to son-in-law  Objective:  Filed Vitals:   01/07/16 0210 01/07/16 0509 01/07/16 0516 01/07/16 0519  BP: 174/93   174/85  Pulse: 74   69  Temp: 98.2 F (36.8 C)  97.8 F (36.6 C) 97.8 F (36.6 C)  TempSrc: Oral   Axillary  Resp: 16     Height:      Weight:  94.121 kg (207 lb 8 oz)    SpO2: 96%   97%    Intake/Output Summary (Last 24 hours) at 01/07/16 1044 Last data filed at 01/07/16 0630  Gross per 24 hour  Intake    814 ml  Output   1200 ml  Net   -386 ml   Filed Weights   01/05/16 0603 01/06/16 0540 01/07/16 0509  Weight: 93.7 kg (206  lb 9.1 oz) 94.121 kg (207 lb 8 oz) 94.121 kg (207 lb 8 oz)    Examination:  General exam: Pleasant middle-aged male lying supine in bed-some lip smacking Respiratory system: Clear to auscultation. Respiratory effort normal. Cardiovascular system: S1 & S2 heard, RRR.Marland Kitchen No JVD, murmurs, rubs, gallops or clicks. No pedal  edema. Gastrointestinal system: Abdomen is nondistended, soft and nontender. No organomegaly or masses felt. Normal bowel sounds heard. Central nervous system: Alert, nonverbal-follow some instructions. No focal neurological deficits. Extremities: Symmetric 5 x 5 power.Left BKA stump not visualized 5/10 Skin: No rashes, lesions. Psychiatry: cannot be assessed at this time.     Data Reviewed: I have personally reviewed following labs and imaging studies  CBC:  Recent Labs Lab 01/01/16 1650 01/02/16 0835 01/03/16 0246 01/04/16 0543 01/07/16 0803  WBC 7.7 15.5* 13.1* 13.0* 10.7*  NEUTROABS 3.5  --   --   --  PENDING  HGB 11.7* 9.9* 8.7* 9.8* 10.0*  HCT 34.8* 29.5* 25.4* 29.9* 30.7*  MCV 87.7 88.9 87.0 86.9 88.0  PLT 182 205 220 336 [id number]*   Basic Metabolic Panel:  Recent Labs Lab 01/02/16 0430 01/02/16 0835 01/03/16 0246 01/04/16 0543 01/05/16 0556 01/07/16 0803  NA  --  140 137 142 143 146*  K  --  3.6 3.6 3.7 3.9 4.1  CL  --  112* 109 110 112* 113*  CO2  --  19* 19* 19* 21* 22  GLUCOSE  --  149* 208* 205* 194* 159*  BUN  --  28* 28* 24* 33* 32*  CREATININE  --  2.02* 1.99* 1.89* 1.90* 1.81*  CALCIUM  --  7.9* 7.6* 8.2* 8.0* 8.4*  MG 1.9  --   --   --   --   --    GFR: Estimated Creatinine Clearance: 47.9 mL/min (by C-G formula based on Cr of 1.81). Liver Function Tests:  Recent Labs Lab 01/07/16 0803  AST 29  ALT 62  ALKPHOS 114  BILITOT 0.7  PROT 6.7  ALBUMIN 2.4*   No results for input(s): LIPASE, AMYLASE in the last 168 hours. No results for input(s): AMMONIA in the last 168 hours. Coagulation Profile:  Recent Labs Lab 01/02/16 0002  INR 1.00   Cardiac Enzymes:  Recent Labs Lab 01/02/16 0002 01/02/16 0430 01/02/16 0836  TROPONINI 0.09* 0.09* 0.07*   BNP (last 3 results) No results for input(s): PROBNP in the last 8760 hours. HbA1C: No results for input(s): HGBA1C in the last 72 hours. CBG:  Recent Labs Lab 01/06/16 0836  01/06/16 1126 01/06/16 1648 01/06/16 2355 01/07/16 0753  GLUCAP 134* 96 115* 151* 147*   Lipid Profile: No results for input(s): CHOL, HDL, LDLCALC, TRIG, CHOLHDL, LDLDIRECT in the last 72 hours. Thyroid Function Tests: No results for input(s): TSH, T4TOTAL, FREET4, T3FREE, THYROIDAB in the last 72 hours. Anemia Panel:  Recent Labs  01/04/16 1413  VITAMINB12 763   Urine analysis:    Component Value Date/Time   COLORURINE YELLOW 01/01/2016 1650   APPEARANCEUR CLOUDY* 01/01/2016 1650   LABSPEC 1.020 01/01/2016 1650   PHURINE 5.0 01/01/2016 1650   GLUCOSEU NEGATIVE 01/01/2016 1650   HGBUR LARGE* 01/01/2016 1650   BILIRUBINUR NEGATIVE 01/01/2016 1650   KETONESUR NEGATIVE 01/01/2016 1650   PROTEINUR >300* 01/01/2016 1650   UROBILINOGEN 0.2 04/18/2015 1544   NITRITE NEGATIVE 01/01/2016 1650   LEUKOCYTESUR NEGATIVE 01/01/2016 1650   Sepsis Labs: @LABRCNTIP (procalcitonin:4,lacticidven:4)  ) Recent Results (from the past 240 hour(s))  Culture, blood (x 2)  Status: None (Preliminary result)   Collection Time: 01/02/16 12:02 AM  Result Value Ref Range Status   Specimen Description BLOOD LEFT HAND  Final   Special Requests BOTTLES DRAWN AEROBIC AND ANAEROBIC 5ML  Final   Culture NO GROWTH 4 DAYS  Final   Report Status PENDING  Incomplete  Culture, blood (x 2)     Status: None (Preliminary result)   Collection Time: 01/02/16 12:16 AM  Result Value Ref Range Status   Specimen Description BLOOD LEFT WRIST  Final   Special Requests BOTTLES DRAWN AEROBIC AND ANAEROBIC 5ML  Final   Culture NO GROWTH 4 DAYS  Final   Report Status PENDING  Incomplete  MRSA PCR Screening     Status: None   Collection Time: 01/02/16  3:45 AM  Result Value Ref Range Status   MRSA by PCR NEGATIVE NEGATIVE Final    Comment:        The GeneXpert MRSA Assay (FDA approved for NASAL specimens only), is one component of a comprehensive MRSA colonization surveillance program. It is not intended to  diagnose MRSA infection nor to guide or monitor treatment for MRSA infections.          Radiology Studies: No results found.      Scheduled Meds: . aspirin EC  81 mg Oral Daily  . atorvastatin  20 mg Oral q1800  . carvedilol  6.25 mg Oral BID WC  . collagenase   Topical Daily  . doxycycline  100 mg Oral Q12H  . enoxaparin (LOVENOX) injection  40 mg Subcutaneous Q24H  . insulin aspart  0-5 Units Subcutaneous QHS  . insulin aspart  0-9 Units Subcutaneous TID WC  . lacosamide (VIMPAT) IV  200 mg Intravenous Q12H  . pantoprazole  40 mg Oral Daily  . pneumococcal 23 valent vaccine  0.5 mL Intramuscular Tomorrow-1000  . sodium chloride flush  3 mL Intravenous Q12H  . thiamine  100 mg Oral Daily   Or  . thiamine  100 mg Intravenous Daily  . valproate sodium  500 mg Intravenous Q8H   Continuous Infusions:     LOS: 5 days    Time spent: 35 minutes. Discussed care with family, Dr. Leonel Ramsay Neuro   Mark Griffes, MD [phone]  If 7PM-7AM, please contact night-coverage [email or web address] Password [email or web address] 01/07/2016, 10:44 AM

## 2016-01-07 NOTE — Clinical Social Work Note (Signed)
Clinical Social Work Assessment  Patient Details  Name: Mark [name] MRN: [id number] Date of Birth: October 17, 1950  Date of referral:  01/07/16               Reason for consult:  Facility Placement                Permission sought to share information with:  Family Supports Permission granted to share information::  Yes, Verbal Permission Granted  Name::     Patent attorney::  SNFs  Relationship::  wife  Contact Information:     Housing/Transportation Living arrangements for the past 2 months:  Single Family Home Source of Information:  Spouse Patient Interpreter Needed:  None Criminal Activity/Legal Involvement Pertinent to Current Situation/Hospitalization:    Significant Relationships:  Spouse Lives with:  Spouse Do you feel safe going back to the place where you live?  No Need for family participation in patient care:  Yes (Comment) (decision making)  Care giving concerns:  Pt lives at home with wife who works during the day- would not have sufficient support to return home at this time.   Social Worker assessment / plan:  CSW spoke with pt wife concerning plan for pt at time of DC.  CSW discussed PT recommendation for SNF stay with goal of helping pt with recovering function.     Employment status:    Insurance information:  Medicaid In [place] PT Recommendations:  [place] / Referral to community resources:  [place]  Patient/Family's Response to care:  Pt wife is agreeable to SNF placement and acknowledges there would be no way to take pt home the way he is functioning at this time.  Patient/Family's Understanding of and Emotional Response to Diagnosis, Current Treatment, and Prognosis:  Unclear at this time- MDs still working pt up and pt wife is hopeful that they will have a clearer picture of his prognosis soon.  Emotional Assessment Appearance:  Appears stated age Attitude/Demeanor/Rapport:  Unable to Assess Affect (typically  observed):  Unable to Assess Orientation:   (DOx4) Alcohol / Substance use:  Illicit Drugs Psych involvement (Current and /or in the community):  No (Comment)  Discharge Needs  Concerns to be addressed:  Care Coordination, Discharge Planning Concerns Readmission within the last 30 days:  No Current discharge risk:  Physical Impairment, Cognitively Impaired Barriers to Discharge:  Continued Medical Work up, Inadequate or no insurance   [place], [place], [place] 01/07/2016, 12:45 PM

## 2016-01-08 ENCOUNTER — Inpatient Hospital Stay (HOSPITAL_COMMUNITY): Payer: Medicaid Other

## 2016-01-08 LAB — CBC
HCT: 30 % — ABNORMAL LOW (ref 39.0–52.0)
Hemoglobin: 10 g/dL — ABNORMAL LOW (ref 13.0–17.0)
MCH: 29.7 pg (ref 26.0–34.0)
MCHC: 33.3 g/dL (ref 30.0–36.0)
MCV: 89 fL (ref 78.0–100.0)
Platelets: 610 10*3/uL — ABNORMAL HIGH (ref 150–400)
RBC: 3.37 MIL/uL — ABNORMAL LOW (ref 4.22–5.81)
RDW: 14.2 % (ref 11.5–15.5)
WBC: 11.8 10*3/uL — ABNORMAL HIGH (ref 4.0–10.5)

## 2016-01-08 LAB — GLUCOSE, CAPILLARY
Glucose-Capillary: 195 mg/dL — ABNORMAL HIGH (ref 65–99)
Glucose-Capillary: 176 mg/dL — ABNORMAL HIGH (ref 65–99)

## 2016-01-08 LAB — BASIC METABOLIC PANEL
Anion gap: 11 (ref 5–15)
BUN: 28 mg/dL — ABNORMAL HIGH (ref 6–20)
CO2: 24 mmol/L (ref 22–32)
Calcium: 8.7 mg/dL — ABNORMAL LOW (ref 8.9–10.3)
Chloride: 109 mmol/L (ref 101–111)
Creatinine, Ser: 1.67 mg/dL — ABNORMAL HIGH (ref 0.61–1.24)
GFR calc Af Amer: 48 mL/min — ABNORMAL LOW (ref >60)
GFR calc non Af Amer: 42 mL/min — ABNORMAL LOW (ref >60)
Glucose, Bld: 193 mg/dL — ABNORMAL HIGH (ref 65–99)
Potassium: 3.8 mmol/L (ref 3.5–5.1)
Sodium: 144 mmol/L (ref 135–145)

## 2016-01-08 MED ORDER — LACOSAMIDE 200 MG PO TABS: 200.0000 mg | ORAL_TABLET | Freq: Two times a day (BID) | ORAL | Status: DC

## 2016-01-08 MED ORDER — DIVALPROEX SODIUM 125 MG PO CSDR: 500.0000 mg | DELAYED_RELEASE_CAPSULE | Freq: Three times a day (TID) | ORAL | Status: DC

## 2016-01-08 MED ORDER — CARVEDILOL 6.25 MG PO TABS: 6.2500 mg | ORAL_TABLET | Freq: Two times a day (BID) | ORAL | Status: DC

## 2016-01-08 NOTE — Clinical Social Work Placement (Signed)
   CLINICAL SOCIAL WORK PLACEMENT  NOTE  Date:  01/08/2016  Patient Details  Name: Mark [name] MRN: [id number] Date of Birth: [birth year]  Clinical Social Work is seeking post-discharge placement for this patient at the [place] level of care (*CSW will initial, date and re-position this form in  chart as items are completed):  Yes   Patient/family provided with [facility] Work Department's list of facilities offering this level of care within the geographic area requested by the patient (or if unable, by the patient's family).  Yes   Patient/family informed of their freedom to choose among providers that offer the needed level of care, that participate in Medicare, Medicaid or managed care program needed by the patient, have an available bed and are willing to accept the patient.  Yes   Patient/family informed of [facility]'s ownership interest in [facility] and [facility], as well as of the fact that they are under no obligation to receive care at these facilities.  PASRR submitted to EDS on 01/07/16     PASRR number received on 01/07/16     Existing PASRR number confirmed on       FL2 transmitted to all facilities in geographic area requested by pt/family on 01/07/16     FL2 transmitted to all facilities within larger geographic area on       Patient informed that his/her managed care company has contracts with or will negotiate with certain facilities, including the following:        Yes   Patient/family informed of bed offers received.  Patient [facility] bed [facility]     Physician recommends and patient chooses bed at      Patient to be transferred to [place] on 01/08/16.  Patient to be transferred to facility by ptar     Patient family notified on 01/08/16 of transfer.  Name of family member notified:  [name]     PHYSICIAN Please sign FL2     Additional Comment:     _______________________________________________ [place]ford Mon, LCSW 01/08/2016, 1:39 PM
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
## 2016-01-08 NOTE — Discharge Summary (Signed)
Physician Discharge Summary  Mark [name] [id number] DOB: Jan 23, 1951 DOA: 01/01/2016  PCP: Minerva Ends, MD  Admit date: 01/01/2016 Discharge date: 01/08/2016  Time spent: 35 minutes  Recommendations for Outpatient Follow-up:   1. -Patient to get Depakote and Vimpat at doses prescribed below 2. -Patient requires assistance with feeding 24 7 3. -would not administer hypoglycemic oral agents unless patient eats more than 50% of her meal.  Continue to use SSI and Lantus 4. -Patient will need complete metabolic panel in about one week 5. -Monitor patient and should participate with therapy services 6. -Patient needs outpatient follow-up with neurology for repeat MRI EEG prior to consolidating AED regimen  Discharge Diagnoses:  Principal Problem:   Acute encephalopathy Active Problems:   S/P BKA (below knee amputation) ([place])   Essential hypertension   Chronic combined systolic and diastolic congestive heart failure (HCC)   Acute renal failure superimposed on stage 3 chronic kidney disease (HCC)   Diabetes mellitus, type 2 (HCC)   Malnutrition of moderate degree (HCC)   GERD (gastroesophageal reflux disease)   Hypothermia   Hypokalemia   History of Clostridium difficile colitis   Wound infection ([place])   Hypoglycemia   Discharge Condition: Fair to guarded  Diet recommendation: Dysphagia 1  Filed Weights   01/06/16 0540 01/07/16 0509 01/08/16 0421  Weight: 94.121 kg (207 lb 8 oz) 94.121 kg (207 lb 8 oz) 95.1 kg (209 lb 10.5 oz)    History of present illness:  65-year-? Ty II DM,  HTN,  HLD,  GERD,  stage III chronic kidney disease,  chronic combined systolic and diastolic CHF, s Plenectomy, presented 01/01/16 after being found unresponsive and with hypoglycemia (unclear duration).  EMS found CBG of 53. 2 episodes of hypoglycemia on day prior.  In ED, UDS positive for cocaine, UA negative, worsened creatinine, chest x-ray negative, CT head negative, blood  alcohol level <5.  Admitted to stepdown for further management.  Transferred to medical bed 5/6. Abnormal MRI brain 5/7.  Seizure like activity noted 5/8. Neurology consulting .  MRI brain could represent hypoglycemic injury or could represent sequelae of seizure activity. LP unremarkable.  Started AEDs. Slightly improved  Hospital Course:   Acute encephalopathy/seizure like activity. - Etiology possibly multifactorial: Hypoglycemia related to poor oral intake/oral hypoglycemics and worsening renal functions, cocaine abuse. Low index of suspicion for infectious etiology. - CT head: No acute findings. Chest x-ray: No acute findings. UA: Not suggestive of UTI. - UDS: Positive for cocaine. - Treated underlying cause i.e. hypoglycemia and acute kidney injury. - He denied all substance abuse - TSH 1.134. HIV antibody nonreactive on 10/17/14. - Despite about treatment, mental status did not significantly improve. MRI brain abnormal with results as below.  - Developed seizure-like activity on 5/8. Abnormal EEG. Neurology consulted and started AEDs. -showing more purposeful movement 5/10--moving UE's and said "yes and no" to s-i-law 5/9 - Neurology follow-up appreciated LP unremarkable. -And discharge AED was changed to 200 q12, Depakote --?consolidate dose to tid 500-->1.5 gm per Neuro as an outpatient after MRI, EEG and further workup has been performed -No overt seizure-space out ativan to q6 prn sz --- Ativan IV was discontinued on discharge   Poorly controlled type II DM with renal complications and hypoglycemia -Hypoglycemia likely secondary to oral hypoglycemics complicating poor oral intake and worsening renal functions - Glipizide held because of poor ability to take po - Briefly treated with IV D10 infusion. Hypoglycemia resolved - Diet resumed-Feeder-RN made aware   -  Started NovoLog SSI-ranging 147-159 - Check hemoglobin A1c: 8.9. - Only on oral hypoglycemics at home and  not on insulin's.  -We'll resume oral hypoglycemics at home and would recommend only use of oral hypoglycemics if eating more than 50% of meals  Essential hypertension, uncontrolled -Resumed carvedilol 3.125 [home dose]--> 6.25 bid.  -When necessary IV hydralazine was given as prn on admit -Hold lisinopril secondary to acute kidney injury.  Acute on stage III chronic kidney disease, component cocaine nephropathy as well - Worsened by poor oral intake, dehydration and ACEI. Hold lisinopril temporarily. - Hydrated with IV fluids. Creatinine has plateaued in the 2 range. Discontinued IV fluids. - Last creatinine 08/15/15:1.44. Creatinine has improved and plateaued in the 1.8-1.9 range.   GERD - PPI.  Hyperlipidemia - Statins. LDL 82, HDL 21.  Chronic combined systolic and diastolic CHF - 2-D echo 4/40/34: LVEF 45-50 percent and grade 2 diastolic dysfunction. - Treated for dehydration on admission.  - 2-D echo results EF normal.  Elevated troponin - Likely demand ischemia related to hypoglycemia, dehydration, acute kidney injury and cocaine abuse. - Continue aspirin. - Flat troponin trend. No chest pain reported. EKG shows mild sinus tachycardia without acute changes. Baseline tremor artifact. - 2-D echo-unremarkable/normal EF-detailed report as below. - Continue carvedilol.  Overweight, Body mass index is 26.63 kg/(m^2). - As per dietitian input, meets criteria for overweight.  Hypokalemia - Replaced.   History of C. difficile colitis/questionable diarrhea - Monitor for now. No diarrhea reported.   Left BKA with small stump abscess - Wound consultation appreciated. No overt features suggestive of infection. Discontinue empirically started IV antibiotics. - Lactate normal. - On 5/6, noted on later aspect of BKA stump approximately 1.5 cm diameter fluctuant area with tiny opening draining white/purulent discharge. Suspicious for small abscess.  -Consulted Dr. Sharol Given: Ulcer over  anterior lateral aspect of left leg, treat with Santyl.  -No surgical indications. - Follow-up in office with Dr. Sharol Given . Completed 1 week of oral doxycycline on 01/08/16. -wound re-examined 5/11 and no s/symptoms of purulence  Anemia - Stable in the 9 g per DL range.  Microscopic hematuria - Outpatient evaluation and follow-up as deemed necessary.  Cocaine abuse - Patient denies but UDS positive.    DVT prophylaxis: Lovenox  Code Status: Full  Family Communication: no family +, Wife is Sohil Timko [phone] on 01/08/16 Disposition Plan: stepdown unit--> medical floor 5/6.   Consultants:   [facility]  Procedures:   EEG 01/05/16: EEG Abnormalities: 1) Bifrontal periodic epileptiform discharges 2) generalized irregular delta activity.   Clinical Interpretation: This EEG is consistent with an area of cortical irritability and potential seizure focus in the frontal region. I supect that these represent generalized periodic epileptiform discharges(GPEDs), though periodic lateralized epileptiform discharges(PLEDs) with rapid spread through the anterior commissure rather than GPEDs are possible. These could be consistent with structural injury(e.g. Hypoglycemic injury), infection, or post-ictal finding. There is no clear evolution to suggest ongoing seizure.    2-D echo 01/03/16: Study Conclusions  - Left ventricle: The cavity size was normal. Wall thickness was  increased in a pattern of moderate LVH. Systolic function was  normal. The estimated ejection fraction was in the range of 60%  to 65%. Wall motion was normal; there were no regional wall  motion abnormalities. Doppler parameters are consistent with  abnormal left ventricular relaxation (grade 1 diastolic  dysfunction). - Aortic valve: Nodular calcification of the non coronary cusp. - Left atrium: The atrium was mildly dilated. -  Atrial septum: No defect or patent foramen ovale was  identified.   Antimicrobials:   IV Zosyn 5/4 >discontinued  IV vancomycin 5/4 >discontinued   Doxycycline 5/6 >   Discharge Exam: Filed Vitals:   01/08/16 0251 01/08/16 0421  BP: 157/78 157/97  Pulse: 81 81  Temp:  98.9 F (37.2 C)  Resp:  18    General: sleepy but rousable.  Non verbal but pleasant Cardiovascular: s1 s2 no m/r/g Respiratory: clear no added sound  Discharge Instructions    Current Discharge Medication List    START taking these medications   Details  divalproex (DEPAKOTE SPRINKLE) 125 MG capsule Take 4 capsules (500 mg total) by mouth every 8 (eight) hours. Qty: 90 capsule, Refills: 0    lacosamide (VIMPAT) 200 MG TABS tablet Take 1 tablet (200 mg total) by mouth 2 (two) times daily. Qty: 60 tablet, Refills: 0      CONTINUE these medications which have CHANGED   Details  carvedilol (COREG) 6.25 MG tablet Take 1 tablet (6.25 mg total) by mouth 2 (two) times daily with a meal. Qty: 60 tablet, Refills: 0      CONTINUE these medications which have NOT CHANGED   Details  atorvastatin (LIPITOR) 20 MG tablet Take 1 tablet (20 mg total) by mouth daily. Qty: 30 tablet, Refills: 3   Associated Diagnoses: Chronic combined systolic and diastolic congestive heart failure (HCC)    lisinopril (PRINIVIL,ZESTRIL) 10 MG tablet Take 1 tablet (10 mg total) by mouth daily. Qty: 30 tablet, Refills: 5   Associated Diagnoses: Essential hypertension    !! ACCU-CHEK AVIVA PLUS test strip USE AS INSTRUCTED Qty: 100 each, Refills: 12    aspirin EC 81 MG EC tablet Take 1 tablet (81 mg total) by mouth daily. Qty: 30 tablet, Refills: 0    B-D UF III MINI PEN NEEDLES 31G X 5 MM MISC USE TO INJECT INSULIN AS DIRECTED BY PHYSICIAN Qty: 100 each, Refills: 12    Blood Glucose Monitoring Suppl (ACCU-CHEK AVIVA PLUS) W/DEVICE KIT Use as prescribed TID before meals and QHS Qty: 1 kit, Refills: 0    ferrous gluconate (FERGON) 324 MG tablet Take 1 tablet (324 mg total)  by mouth 2 (two) times daily with a meal. Qty: 60 tablet, Refills: 1    !! glucose blood (ACCU-CHEK AVIVA) test strip Use as instructed Qty: 100 each, Refills: 12    Insulin Glargine (LANTUS SOLOSTAR) 100 UNIT/ML Solostar Pen Inject 10 Units into the skin daily at 10 pm. Qty: 10 pen, Refills: 3   Associated Diagnoses: Type 2 diabetes mellitus with complication, unspecified long term insulin use status (HCC)    Insulin Syringe-Needle U-100 (INSULIN SYRINGE .5CC/30GX5/16") 30G X 5/16" 0.5 ML MISC Check blood sugar TID & QHS Qty: 100 each, Refills: 2    Lancets (ACCU-CHEK SOFT TOUCH) lancets Use as instructed Qty: 100 each, Refills: 12     !! - Potential duplicate medications found. Please discuss with provider.    STOP taking these medications     glipiZIDE (GLUCOTROL) 10 MG tablet        No Known Allergies Follow-up Information    Follow up with DUDA,MARCUS V, MD In 2 weeks.   Specialty:  Orthopedic Surgery   Contact information:   [address] [place] [address] [phone]        The results of significant diagnostics from this hospitalization (including imaging, microbiology, ancillary and laboratory) are listed below for reference.    Significant Diagnostic Studies:  Ct Head Wo Contrast  01/01/2016  CLINICAL DATA:  Hypoglycemia.  History of diabetes and hypertension. EXAM: CT HEAD WITHOUT CONTRAST TECHNIQUE: Contiguous axial images were obtained from the base of the skull through the vertex without intravenous contrast. COMPARISON:  None. FINDINGS: Examination is minimally degraded secondary to patient motion. Brain: Gray-white differentiation is maintained. No CT evidence of acute large territory infarct. No intraparenchymal or extra-axial mass or hemorrhage. Normal size and configuration of the ventricles and basilar cisterns. No midline shift. Vascular: No hyperdense vessel or unexpected calcification. Skull: Negative for fracture or focal lesion.  Sinuses/Orbits: There is underpneumatization of the right mastoid air cells. The remaining paranasal sinuses mm left mastoid air cells are normally aerated. No air-fluid levels. Other: Regional soft tissues appear normal IMPRESSION: Negative noncontrast CT. Electronically Signed   By: Sandi Mariscal M.D.   On: 01/01/2016 19:37   Mr Brain Wo Contrast  01/04/2016  CLINICAL DATA:  Altered mental status. Found unresponsive on couch by wife this afternoon. EXAM: MRI HEAD WITHOUT CONTRAST TECHNIQUE: Multiplanar, multiecho pulse sequences of the brain and surrounding structures were obtained without intravenous contrast. COMPARISON:  Head CT from 3 days ago FINDINGS: Limited by motion, best obtainable with the available sedation Calvarium and upper cervical spine: No focal marrow signal abnormality. Orbits: Negative. Sinuses and Mastoids: Bilateral mastoid effusion. Brain: There is symmetric restricted diffusion of the bifrontal cortex. There is associated swelling and hyperintensity on FLAIR and T1 weighted imaging. There is notable sparing of the insula, posterior cingulate gyri, and temporal lobes. There is no subcortical diffusion abnormality including in the pulvinar region. The left parietal cortex is asymmetrically hyperintense on diffusion-weighted images compared to the right, although this is more subtle and could be related to field inhomogeneity. There is no definite swelling in this region on FLAIR and T1 weighted imaging. Aside from the cortical abnormality, brain appearance is typical for age with mild microvascular ischemic change in the cerebral white matter and normal cerebral volume. No evidence of major vessel occlusion. No hemorrhage, hydrocephalus, or shift. These results were called by telephone at the time of interpretation on 01/04/2016 at 7:07 pm to Dr. Rachelle Hora, who verbally acknowledged these results. IMPRESSION: 1. Bifrontal cortex abnormality with restricted diffusion and swelling. This is a  nonspecific pattern and could be related to seizure phenomenon, cerebritis (not typical pattern for herpes although CSF correlation should be considered depending on clinical features), or metabolic insult (history of recent hypoglycemia, although this would not be typical pattern). 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 2. Mastoid effusions. 3. Motion degraded study. Electronically Signed   By: Monte Fantasia M.D.   On: 01/04/2016 19:08   Dg Chest Portable 1 View  01/01/2016  CLINICAL DATA:  Altered mental status EXAM: PORTABLE CHEST 1 VIEW COMPARISON:  04/16/2015 FINDINGS: Cardiomediastinal silhouette is stable. Limited study by poor inspiration. No acute infiltrate or pleural effusion. No pulmonary edema. Mild degenerative change thoracic spine. IMPRESSION: No active disease. Electronically Signed   By: Lahoma Crocker M.D.   On: 01/01/2016 19:42    Microbiology: Recent Results (from the past 240 hour(s))  Culture, blood (x 2)     Status: None   Collection Time: 01/02/16 12:02 AM  Result Value Ref Range Status   Specimen Description BLOOD LEFT HAND  Final   Special Requests BOTTLES DRAWN AEROBIC AND ANAEROBIC 5ML  Final   Culture NO GROWTH 5 DAYS  Final   Report Status 01/07/2016 FINAL  Final  Culture, blood (x 2)     Status: None   Collection Time: 01/02/16 12:16 AM  Result Value Ref Range Status   Specimen Description BLOOD LEFT WRIST  Final   Special Requests BOTTLES DRAWN AEROBIC AND ANAEROBIC 5ML  Final   Culture NO GROWTH 5 DAYS  Final   Report Status 01/07/2016 FINAL  Final  MRSA PCR Screening     Status: None   Collection Time: 01/02/16  3:45 AM  Result Value Ref Range Status   MRSA by PCR NEGATIVE NEGATIVE Final    Comment:        The GeneXpert MRSA Assay (FDA approved for NASAL specimens only), is one component of a comprehensive MRSA colonization surveillance program. It is not intended to diagnose MRSA infection nor to guide  or monitor treatment for MRSA infections.      Labs: Basic Metabolic Panel:  Recent Labs Lab 01/02/16 0430  01/03/16 0246 01/04/16 0543 01/05/16 7564 01/07/16 0803 01/08/16 0722  NA  --   < > 137 142 143 146* 144  K  --   < > 3.6 3.7 3.9 4.1 3.8  CL  --   < > 109 110 112* 113* 109  CO2  --   < > 19* 19* 21* 22 24  GLUCOSE  --   < > 208* 205* 194* 159* 193*  BUN  --   < > 28* 24* 33* 32* 28*  CREATININE  --   < > 1.99* 1.89* 1.90* 1.81* 1.67*  CALCIUM  --   < > 7.6* 8.2* 8.0* 8.4* 8.7*  MG 1.9  --   --   --   --   --   --   < > = values in this interval not displayed. Liver Function Tests:  Recent Labs Lab 01/07/16 0803  AST 29  ALT 62  ALKPHOS 114  BILITOT 0.7  PROT 6.7  ALBUMIN 2.4*   No results for input(s): LIPASE, AMYLASE in the last 168 hours. No results for input(s): AMMONIA in the last 168 hours. CBC:  Recent Labs Lab 01/01/16 1650 01/02/16 0835 01/03/16 0246 01/04/16 0543 01/07/16 0803 01/08/16 0722  WBC 7.7 15.5* 13.1* 13.0* 10.7* 11.8*  NEUTROABS 3.5  --   --   --  6.0  --   HGB 11.7* 9.9* 8.7* 9.8* 10.0* 10.0*  HCT 34.8* 29.5* 25.4* 29.9* 30.7* 30.0*  MCV 87.7 88.9 87.0 86.9 88.0 89.0  PLT 182 205 220 336 571* 610*   Cardiac Enzymes:  Recent Labs Lab 01/02/16 0002 01/02/16 0430 01/02/16 0836  TROPONINI 0.09* 0.09* 0.07*   BNP: BNP (last 3 results)  Recent Labs  04/16/15 1409 01/02/16 0430  BNP 17.5 496.3*    ProBNP (last 3 results) No results for input(s): PROBNP in the last 8760 hours.  CBG:  Recent Labs Lab 01/07/16 0753 01/07/16 1232 01/07/16 1628 01/07/16 2152 01/08/16 0828  GLUCAP 147* 175* 177* 231* 195*       Signed:  Nita Sells MD   [facility] 01/08/2016, 10:39 AM

## 2016-01-08 NOTE — Progress Notes (Signed)
Patient will discharge to [facility] Anticipated discharge date: 5/11 Family notified: pt wife Transportation by [employer]- scheduled for 2:15pm  CSW signing off.  Domenica Reamer, [place] Social Worker [phone]

## 2016-01-08 NOTE — Care Management Note (Signed)
Case Management Note  Patient Details  Name: GEREMIAH SMALLS MRN: [id number] Date of Birth: 1951/04/07  Subjective/Objective:                    Action/Plan:  Will DC to SNF today as facilitated by CSW.   Expected Discharge Date:                  Expected Discharge Plan:  Skilled Nursing Facility  In-House Referral:  Clinical Social Work  Discharge planning Services  CM Consult  Post Acute Care Choice:    Choice offered to:     DME Arranged:    DME Agency:     HH Arranged:    [place] Agency:     Status of Service:  Completed, signed off  Medicare Important Message Given:    Date Medicare IM Given:    Medicare IM give by:    Date Additional Medicare IM Given:    Additional Medicare Important Message give by:     If discussed at [place] of Stay Meetings, dates discussed:    Additional Comments:  Carles Collet, RN 01/08/2016, 12:10 PM

## 2016-01-09 ENCOUNTER — Encounter (HOSPITAL_COMMUNITY): Payer: Self-pay | Admitting: Emergency Medicine

## 2016-01-09 ENCOUNTER — Emergency Department (HOSPITAL_COMMUNITY)
Admission: EM | Admit: 2016-01-09 | Discharge: 2016-01-09 | Disposition: A | Discharge status: Home / Self Care | Payer: Medicaid Other | Attending: Emergency Medicine | Admitting: Emergency Medicine

## 2016-01-09 DIAGNOSIS — I129 Hypertensive chronic kidney disease with stage 1 through stage 4 chronic kidney disease, or unspecified chronic kidney disease: Secondary | ICD-10-CM | POA: Diagnosis not present

## 2016-01-09 DIAGNOSIS — I509 Heart failure, unspecified: Secondary | ICD-10-CM | POA: Diagnosis not present

## 2016-01-09 DIAGNOSIS — D649 Anemia, unspecified: Secondary | ICD-10-CM

## 2016-01-09 DIAGNOSIS — Z8719 Personal history of other diseases of the digestive system: Secondary | ICD-10-CM | POA: Diagnosis not present

## 2016-01-09 DIAGNOSIS — Z87891 Personal history of nicotine dependence: Secondary | ICD-10-CM | POA: Diagnosis not present

## 2016-01-09 DIAGNOSIS — Z79899 Other long term (current) drug therapy: Secondary | ICD-10-CM | POA: Insufficient documentation

## 2016-01-09 DIAGNOSIS — Q8901 Asplenia (congenital): Secondary | ICD-10-CM | POA: Insufficient documentation

## 2016-01-09 DIAGNOSIS — Z7982 Long term (current) use of aspirin: Secondary | ICD-10-CM | POA: Diagnosis not present

## 2016-01-09 DIAGNOSIS — N189 Chronic kidney disease, unspecified: Secondary | ICD-10-CM | POA: Diagnosis not present

## 2016-01-09 DIAGNOSIS — Z794 Long term (current) use of insulin: Secondary | ICD-10-CM | POA: Insufficient documentation

## 2016-01-09 DIAGNOSIS — E1122 Type 2 diabetes mellitus with diabetic chronic kidney disease: Secondary | ICD-10-CM | POA: Diagnosis not present

## 2016-01-09 DIAGNOSIS — Z89512 Acquired absence of left leg below knee: Secondary | ICD-10-CM | POA: Diagnosis not present

## 2016-01-09 DIAGNOSIS — G9340 Encephalopathy, unspecified: Secondary | ICD-10-CM

## 2016-01-09 DIAGNOSIS — I10 Essential (primary) hypertension: Secondary | ICD-10-CM

## 2016-01-09 LAB — BASIC METABOLIC PANEL
Anion gap: 11 (ref 5–15)
BUN: 32 mg/dL — ABNORMAL HIGH (ref 6–20)
CO2: 22 mmol/L (ref 22–32)
Calcium: 8.6 mg/dL — ABNORMAL LOW (ref 8.9–10.3)
Chloride: 110 mmol/L (ref 101–111)
Creatinine, Ser: 1.75 mg/dL — ABNORMAL HIGH (ref 0.61–1.24)
GFR calc Af Amer: 46 mL/min — ABNORMAL LOW (ref >60)
GFR calc non Af Amer: 39 mL/min — ABNORMAL LOW (ref >60)
Glucose, Bld: 203 mg/dL — ABNORMAL HIGH (ref 65–99)
Potassium: 4.8 mmol/L (ref 3.5–5.1)
Sodium: 143 mmol/L (ref 135–145)

## 2016-01-09 LAB — I-STAT TROPONIN, ED: Troponin i, poc: 0.02 ng/mL (ref 0.00–0.08)

## 2016-01-09 LAB — CBC
HCT: 30.5 % — ABNORMAL LOW (ref 39.0–52.0)
Hemoglobin: 9.7 g/dL — ABNORMAL LOW (ref 13.0–17.0)
MCH: 28.2 pg (ref 26.0–34.0)
MCHC: 31.8 g/dL (ref 30.0–36.0)
MCV: 88.7 fL (ref 78.0–100.0)
Platelets: 567 10*3/uL — ABNORMAL HIGH (ref 150–400)
RBC: 3.44 MIL/uL — ABNORMAL LOW (ref 4.22–5.81)
RDW: 14.6 % (ref 11.5–15.5)
WBC: 12.4 10*3/uL — ABNORMAL HIGH (ref 4.0–10.5)

## 2016-01-09 LAB — CBG MONITORING, ED: Glucose-Capillary: 211 mg/dL — ABNORMAL HIGH (ref 65–99)

## 2016-01-09 MED ORDER — LISINOPRIL 10 MG PO TABS
10.0000 mg | ORAL_TABLET | Freq: Once | ORAL | Status: AC
  Administered 2016-01-09: 10 mg via ORAL
  Filled 2016-01-09: qty 1

## 2016-01-09 MED ORDER — CLONIDINE HCL 0.1 MG PO TABS
0.1000 mg | ORAL_TABLET | Freq: Once | ORAL | Status: AC
  Administered 2016-01-09: 0.1 mg via ORAL
  Filled 2016-01-09: qty 1

## 2016-01-09 MED ORDER — ASPIRIN 81 MG PO CHEW
81.0000 mg | CHEWABLE_TABLET | Freq: Every day | ORAL | Status: DC
  Administered 2016-01-09: 81 mg via ORAL
  Filled 2016-01-09: qty 1

## 2016-01-09 MED ORDER — LACOSAMIDE 200 MG PO TABS
200.0000 mg | ORAL_TABLET | Freq: Two times a day (BID) | ORAL | Status: DC
  Administered 2016-01-09: 200 mg via ORAL
  Filled 2016-01-09: qty 1

## 2016-01-09 MED ORDER — DIVALPROEX SODIUM 125 MG PO CSDR
125.0000 mg | DELAYED_RELEASE_CAPSULE | Freq: Three times a day (TID) | ORAL | Status: DC
  Administered 2016-01-09: 125 mg via ORAL
  Filled 2016-01-09: qty 1

## 2016-01-09 MED ORDER — HYDRALAZINE HCL 20 MG/ML IJ SOLN
10.0000 mg | INTRAMUSCULAR | Status: AC
  Administered 2016-01-09: 10 mg via INTRAVENOUS
  Filled 2016-01-09: qty 1

## 2016-01-09 MED ORDER — ATORVASTATIN CALCIUM 10 MG PO TABS
20.0000 mg | ORAL_TABLET | Freq: Every day | ORAL | Status: DC
  Administered 2016-01-09: 20 mg via ORAL
  Filled 2016-01-09: qty 2

## 2016-01-09 MED ORDER — LISINOPRIL 10 MG PO TABS: 10.0000 mg | ORAL_TABLET | Freq: Once | ORAL | Status: DC

## 2016-01-09 MED ORDER — CARVEDILOL 12.5 MG PO TABS
6.2500 mg | ORAL_TABLET | Freq: Two times a day (BID) | ORAL | Status: DC
  Administered 2016-01-09: 6.25 mg via ORAL
  Filled 2016-01-09: qty 1
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
## 2016-01-09 NOTE — ED Notes (Signed)
Pt arrives via [facility] via EMS for uncontrolled HTN, transferred to facility yesterday and hasn't had BP meds since. Per EMS, has not received any meds. BP in 2200s per EMS.

## 2016-01-09 NOTE — ED Notes (Signed)
Report called to [place], Therapist, sports at [facility].

## 2016-01-09 NOTE — Discharge Instructions (Signed)
1. Medications: usual home medications, escalating doses of Clonidine as needed for HTN at the facility 2. Treatment: rest, drink plenty of fluids,  3. Follow Up: Please followup with your primary doctor in 2 days for discussion of your diagnoses and further evaluation after today's visit; if you do not have a primary care doctor use the resource guide provided to find one; Please return to the ER for worsening symptoms

## 2016-01-09 NOTE — ED Notes (Signed)
[facility] called for pickup and transportation to [facility]. ETA given was "it won't be long."

## 2016-01-14 LAB — CBC AND DIFFERENTIAL
HCT: 29 % — AB (ref 41–53)
Hemoglobin: 9 g/dL — AB (ref 13.5–17.5)
Platelets: 418 10*3/uL — AB (ref 150–399)
WBC: 11.4 10^3/mL

## 2016-01-14 LAB — BASIC METABOLIC PANEL
BUN: 23 mg/dL — AB (ref 4–21)
Creatinine: 1.4 mg/dL — AB (ref 0.6–1.3)
Glucose: 175 mg/dL
Potassium: 4.3 mmol/L (ref 3.4–5.3)
Sodium: 141 mmol/L (ref 137–147)

## 2016-01-14 LAB — HEPATIC FUNCTION PANEL
ALT: 14 U/L (ref 10–40)
AST: 17 U/L (ref 14–40)
Alkaline Phosphatase: 90 U/L (ref 25–125)
Bilirubin, Total: 0.3 mg/dL

## 2016-01-15 ENCOUNTER — Encounter: Payer: Self-pay | Admitting: Internal Medicine

## 2016-01-15 NOTE — Progress Notes (Signed)
A user error has taken place: encounter opened in error, closed for administrative reasons.

## 2016-02-24 ENCOUNTER — Other Ambulatory Visit: Payer: Self-pay | Admitting: Family Medicine

## 2016-02-27 ENCOUNTER — Other Ambulatory Visit: Payer: Self-pay | Admitting: Family Medicine

## 2016-03-08 ENCOUNTER — Encounter: Payer: Self-pay | Admitting: Family Medicine

## 2016-03-08 ENCOUNTER — Ambulatory Visit: Payer: Medicaid Other | Attending: Family Medicine | Admitting: Family Medicine

## 2016-03-08 VITALS — BP 141/71 | HR 93 | Temp 99.0°F | Resp 18 | Ht 74.0 in | Wt 190.6 lb

## 2016-03-08 DIAGNOSIS — I5042 Chronic combined systolic (congestive) and diastolic (congestive) heart failure: Secondary | ICD-10-CM

## 2016-03-08 DIAGNOSIS — E118 Type 2 diabetes mellitus with unspecified complications: Secondary | ICD-10-CM

## 2016-03-08 DIAGNOSIS — N183 Chronic kidney disease, stage 3 unspecified: Secondary | ICD-10-CM

## 2016-03-08 DIAGNOSIS — D509 Iron deficiency anemia, unspecified: Secondary | ICD-10-CM

## 2016-03-08 DIAGNOSIS — I10 Essential (primary) hypertension: Secondary | ICD-10-CM

## 2016-03-08 DIAGNOSIS — Z794 Long term (current) use of insulin: Secondary | ICD-10-CM

## 2016-03-08 DIAGNOSIS — R569 Unspecified convulsions: Secondary | ICD-10-CM

## 2016-03-08 DIAGNOSIS — E1122 Type 2 diabetes mellitus with diabetic chronic kidney disease: Secondary | ICD-10-CM

## 2016-03-08 DIAGNOSIS — E875 Hyperkalemia: Secondary | ICD-10-CM

## 2016-03-08 LAB — CBC
HCT: 29.1 % — ABNORMAL LOW (ref 38.5–50.0)
Hemoglobin: 9.5 g/dL — ABNORMAL LOW (ref 13.2–17.1)
MCH: 29.4 pg (ref 27.0–33.0)
MCHC: 32.6 g/dL (ref 32.0–36.0)
MCV: 90.1 fL (ref 80.0–100.0)
MPV: 10.3 fL (ref 7.5–12.5)
Platelets: 327 10*3/uL (ref 140–400)
RBC: 3.23 MIL/uL — ABNORMAL LOW (ref 4.20–5.80)
RDW: 15.3 % — ABNORMAL HIGH (ref 11.0–15.0)
WBC: 6.4 10*3/uL (ref 3.8–10.8)

## 2016-03-08 LAB — GLUCOSE, POCT (MANUAL RESULT ENTRY): POC Glucose: 147 mg/dl — AB (ref 70–99)

## 2016-03-08 LAB — POCT GLYCOSYLATED HEMOGLOBIN (HGB A1C): Hemoglobin A1C: 7.8

## 2016-03-08 MED ORDER — CARVEDILOL 6.25 MG PO TABS: 6.2500 mg | ORAL_TABLET | Freq: Two times a day (BID) | ORAL | Status: DC

## 2016-03-08 MED ORDER — ASPIRIN 81 MG PO TBEC: 81.0000 mg | DELAYED_RELEASE_TABLET | Freq: Every day | ORAL | Status: DC

## 2016-03-08 MED ORDER — ATORVASTATIN CALCIUM 20 MG PO TABS: 20.0000 mg | ORAL_TABLET | Freq: Every day | ORAL | Status: DC

## 2016-03-08 MED ORDER — DIVALPROEX SODIUM 125 MG PO CSDR: 500.0000 mg | DELAYED_RELEASE_CAPSULE | Freq: Three times a day (TID) | ORAL | Status: DC

## 2016-03-08 MED ORDER — INSULIN GLARGINE 100 UNIT/ML SOLOSTAR PEN: 10.0000 [IU] | PEN_INJECTOR | Freq: Every day | SUBCUTANEOUS | Status: DC

## 2016-03-08 MED ORDER — LISINOPRIL 10 MG PO TABS: 10.0000 mg | ORAL_TABLET | Freq: Every day | ORAL | Status: DC

## 2016-03-08 MED ORDER — FERROUS GLUCONATE 324 (38 FE) MG PO TABS: 324.0000 mg | ORAL_TABLET | Freq: Two times a day (BID) | ORAL | Status: DC

## 2016-03-08 MED ORDER — LACOSAMIDE 200 MG PO TABS: 200.0000 mg | ORAL_TABLET | Freq: Two times a day (BID) | ORAL | Status: DC

## 2016-03-08 NOTE — Assessment & Plan Note (Signed)
Suspected new onset seizures causing altered state in 12/2015 Continue vimpat and Depakote Neurology referral for EEG

## 2016-03-08 NOTE — Patient Instructions (Addendum)
Mark [name] was seen today for follow-up.  Diagnoses and all orders for this visit:  Type 2 diabetes mellitus with stage 3 chronic kidney disease, with long-term current use of insulin (HCC) -     HgB A1c -     Glucose (CBG) -     aspirin 81 MG EC tablet; Take 1 tablet (81 mg total) by mouth daily.  Chronic combined systolic and diastolic congestive heart failure (HCC) -     atorvastatin (LIPITOR) 20 MG tablet; Take 1 tablet (20 mg total) by mouth daily. -     carvedilol (COREG) 6.25 MG tablet; Take 1 tablet (6.25 mg total) by mouth 2 (two) times daily with a meal.  Type 2 diabetes mellitus with complication, unspecified long term insulin use status (HCC) -     Insulin Glargine (LANTUS SOLOSTAR) 100 UNIT/ML Solostar Pen; Inject 10 Units into the skin daily at 10 pm. -     aspirin 81 MG EC tablet; Take 1 tablet (81 mg total) by mouth daily.  Essential hypertension -     carvedilol (COREG) 6.25 MG tablet; Take 1 tablet (6.25 mg total) by mouth 2 (two) times daily with a meal. -     lisinopril (PRINIVIL,ZESTRIL) 10 MG tablet; Take 1 tablet (10 mg total) by mouth daily. -     BASIC METABOLIC PANEL WITH GFR  Seizures (HCC) -     divalproex (DEPAKOTE SPRINKLE) 125 MG capsule; Take 4 capsules (500 mg total) by mouth every 8 (eight) hours. -     lacosamide (VIMPAT) 200 MG TABS tablet; Take 1 tablet (200 mg total) by mouth 2 (two) times daily. -     Ambulatory referral to Neurology  IDA (iron deficiency anemia) -     ferrous gluconate (FERGON) 324 MG tablet; Take 1 tablet (324 mg total) by mouth 2 (two) times daily with a meal. -     CBC  CKD (chronic kidney disease) stage 3, GFR 30-59 ml/min -     BASIC METABOLIC PANEL WITH GFR   I have referred you to neurology for out patient follow up. You saw the neurologist in the hospital  All of your refills have been sent to [facility] on [address]   Please drop off the Rx for Vimpat at [facility]   F/u in 3 months for diabetes and HTN   Dr. Adrian Blackwater

## 2016-03-08 NOTE — Assessment & Plan Note (Signed)
Remains well controlled No recurrent hypoglycemia Continue lantus

## 2016-03-08 NOTE — Progress Notes (Signed)
Patient ID: MAKAIL WATLING, male   DOB: October 09, 1950, 65 y.o.   MRN: [id number]   Subjective:  Patient ID: YAZEN ROSKO, male    DOB: February 15, 1951  Age: 65 y.o. MRN: [id number]  CC: Follow-up   HPI FILIPPO PULS presents for    1. CHRONIC DIABETES  Disease Monitoring  Blood Sugar Ranges: all < 200  Polyuria: no   Visual problems: no   Medication Compliance: yes.  Medication Side Effects  Hypoglycemia: no   Preventitive Health Care  Eye Exam: due   Foot Exam: done today   Diet pattern: eats low carb   Exercise: yes, walks for exercise   2. CHRONIC HYPERTENSION  Disease Monitoring  Blood pressure range: not checking   Chest pain: no   Dyspnea: no   Claudication: no   Medication compliance: yes, but did not take meds today   Medication Side Effects  Lightheadedness: no   Urinary frequency: no   Edema: no   3. HFU: he was hospitalized from 5/4-5/11/17 for acute encephalopathy in setting of hypoglycemia. Admission CT head was negative. F/u  MRI done on 01/04/16  revealed: bifrontal cortex abnormality with restricted diffusion and swelling. This is a nonspecific pattern and could be related to seizure phenomenon, cerebritis (not typical pattern for herpes although CSF correlation should be considered depending on clinical features), or metabolic insult (history of recent hypoglycemia, although this would not be typical pattern). 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  Neurology was consulted. He was started on vimpat and depakote for suspected seizures. He was discharged to [facility] for 60 days of rehab. He has been home for one week. He denies recurrent altered mental status, seizure activity, hypoglycemia, weakness or numbness.   Social History  Substance Use Topics  . Smoking status: Current Some Day Smoker    Last Attempt to Quit: 08/03/2013  . Smokeless tobacco: Never Used     Comment: 3 cigarettes/day   . Alcohol Use: 4.2 oz/week    7 Cans of beer per week     Comment: has quit all alcohol   Outpatient Prescriptions Prior to Visit  Medication Sig Dispense Refill  . ACCU-CHEK AVIVA PLUS test strip USE AS INSTRUCTED 100 each 12  . aspirin EC 81 MG EC tablet Take 1 tablet (81 mg total) by mouth daily. 30 tablet 0  . atorvastatin (LIPITOR) 20 MG tablet Take 1 tablet (20 mg total) by mouth daily. 30 tablet 3  . B-D UF III MINI PEN NEEDLES 31G X 5 MM MISC USE TO INJECT INSULIN AS DIRECTED BY PHYSICIAN 100 each 12  . Blood Glucose Monitoring Suppl (ACCU-CHEK AVIVA PLUS) W/DEVICE KIT Use as prescribed TID before meals and QHS 1 kit 0  . carvedilol (COREG) 6.25 MG tablet Take 1 tablet (6.25 mg total) by mouth 2 (two) times daily with a meal. 60 tablet 0  . divalproex (DEPAKOTE SPRINKLE) 125 MG capsule Take 4 capsules (500 mg total) by mouth every 8 (eight) hours. 90 capsule 0  . ferrous gluconate (FERGON) 324 MG tablet Take 1 tablet (324 mg total) by mouth 2 (two) times daily with a meal. (Patient taking differently: Take 324 mg by mouth daily. ) 60 tablet 1  . glucose blood (ACCU-CHEK AVIVA) test strip Use as instructed 100 each 12  . Insulin Glargine (LANTUS SOLOSTAR) 100 UNIT/ML Solostar Pen Inject 10 Units into the skin daily at 10 pm. 10 pen 3  .  Insulin Syringe-Needle U-100 (INSULIN SYRINGE .5CC/30GX5/16") 30G X 5/16" 0.5 ML MISC Check blood sugar TID & QHS 100 each 2  . lacosamide (VIMPAT) 200 MG TABS tablet Take 1 tablet (200 mg total) by mouth 2 (two) times daily. 60 tablet 0  . Lancets (ACCU-CHEK SOFT TOUCH) lancets Use as instructed 100 each 12  . lisinopril (PRINIVIL,ZESTRIL) 10 MG tablet Take 1 tablet (10 mg total) by mouth daily. 30 tablet 5   No facility-administered medications prior to visit.    ROS Review of Systems  Constitutional: Negative for fever, chills, fatigue and unexpected weight change.  Eyes: Negative for visual disturbance.  Respiratory: Negative for cough and  shortness of breath.   Cardiovascular: Negative for chest pain, palpitations and leg swelling.  Gastrointestinal: Negative for nausea, vomiting, abdominal pain, diarrhea, constipation and blood in stool.  Endocrine: Negative for polydipsia, polyphagia and polyuria.  Musculoskeletal: Negative for myalgias, back pain, arthralgias, gait problem and neck pain.  Skin: Negative for rash.  Allergic/Immunologic: Negative for immunocompromised state.  Neurological: Positive for seizures (12/2015).  Hematological: Negative for adenopathy. Does not bruise/bleed easily.  Psychiatric/Behavioral: Negative for suicidal ideas, sleep disturbance and dysphoric mood. The patient is not nervous/anxious.     Objective:  BP 141/71 mmHg  Pulse 93  Temp(Src) 99 F (37.2 C) (Oral)  Resp 18  Ht 6' 2"  (1.88 m)  Wt 190 lb 9.6 oz (86.456 kg)  BMI 24.46 kg/m2  SpO2 98%  BP/Weight 03/08/2016 01/15/2016 1/61/0960  Systolic BP 454 098 119  Diastolic BP 71 62 85  Wt. (Lbs) 190.6 211 -  BMI 24.46 31.15 -   Physical Exam  Constitutional: He appears well-developed and well-nourished. No distress.  HENT:  Head: Normocephalic and atraumatic.  Neck: Normal range of motion. Neck supple.  Cardiovascular: Normal rate, regular rhythm, normal heart sounds and intact distal pulses.   Pulmonary/Chest: Effort normal and breath sounds normal.  Musculoskeletal: He exhibits no edema.  L BKA  Thickened and yellow nails R foot   Neurological: He is alert.  Skin: Skin is warm and dry. No rash noted. No erythema.  Psychiatric: He has a normal mood and affect.   Lab Results  Component Value Date   HGBA1C 7.8 03/08/2016   CBG 147  Assessment & Plan:   Problem List Items Addressed This Visit    Seizures (HCC) (Chronic)   Relevant Medications   divalproex (DEPAKOTE SPRINKLE) 125 MG capsule   lacosamide (VIMPAT) 200 MG TABS tablet   Other Relevant Orders   Ambulatory referral to Neurology   IDA (iron deficiency anemia)  (Chronic)   Relevant Medications   ferrous gluconate (FERGON) 324 MG tablet   Other Relevant Orders   CBC   Essential hypertension (Chronic)   Relevant Medications   atorvastatin (LIPITOR) 20 MG tablet   carvedilol (COREG) 6.25 MG tablet   lisinopril (PRINIVIL,ZESTRIL) 10 MG tablet   aspirin 81 MG EC tablet   Other Relevant Orders   BASIC METABOLIC PANEL WITH GFR   Diabetes mellitus, type 2 (HCC) - Primary (Chronic)   Relevant Medications   atorvastatin (LIPITOR) 20 MG tablet   Insulin Glargine (LANTUS SOLOSTAR) 100 UNIT/ML Solostar Pen   lisinopril (PRINIVIL,ZESTRIL) 10 MG tablet   aspirin 81 MG EC tablet   Other Relevant Orders   HgB A1c (Completed)   Glucose (CBG) (Completed)   CKD (chronic kidney disease) stage 3, GFR 30-59 ml/min (Chronic)   Relevant Orders   BASIC METABOLIC PANEL WITH GFR   Chronic combined  systolic and diastolic congestive heart failure (HCC) (Chronic)   Relevant Medications   atorvastatin (LIPITOR) 20 MG tablet   carvedilol (COREG) 6.25 MG tablet   lisinopril (PRINIVIL,ZESTRIL) 10 MG tablet   aspirin 81 MG EC tablet      No orders of the defined types were placed in this encounter.    Follow-up: Return in about 3 months (around 06/08/2016) for DM2 and HTN .   Boykin Nearing MD

## 2016-03-09 ENCOUNTER — Telehealth: Payer: Self-pay | Admitting: Family Medicine

## 2016-03-09 DIAGNOSIS — E118 Type 2 diabetes mellitus with unspecified complications: Secondary | ICD-10-CM

## 2016-03-09 LAB — BASIC METABOLIC PANEL WITH GFR
BUN: 53 mg/dL — ABNORMAL HIGH (ref 7–25)
CO2: 18 mmol/L — ABNORMAL LOW (ref 20–31)
Calcium: 9.1 mg/dL (ref 8.6–10.3)
Chloride: 110 mmol/L (ref 98–110)
Creat: 2.44 mg/dL — ABNORMAL HIGH (ref 0.70–1.25)
GFR, Est African American: 31 mL/min — ABNORMAL LOW (ref >=60)
GFR, Est Non African American: 27 mL/min — ABNORMAL LOW (ref >=60)
Glucose, Bld: 163 mg/dL — ABNORMAL HIGH (ref 65–99)
Potassium: 5.8 mmol/L — ABNORMAL HIGH (ref 3.5–5.3)
Sodium: 139 mmol/L (ref 135–146)

## 2016-03-09 NOTE — Telephone Encounter (Signed)
RN advised patient per Dr. Adrian Blackwater: Please inform patient that glipizide was discontinued after his hospitalization in May 2017 due to poor po intake and low sugars. He is advised to no longer take glipizide.  RN explained so he would need to take nothing during the day.  Patient is still concerned.  Wants to know what is he suppose to take during the day time?  He is taking the long acting insulin at night so what else is he suppose to take?  He needs something else.  RN advised patient will send questions to provider for review.

## 2016-03-09 NOTE — Telephone Encounter (Signed)
Please inform patient that glipizide was discontinued after his hospitalization in May 2017 due to poor po intake and low sugars. He is advised to no longer take glipizide.

## 2016-03-09 NOTE — Telephone Encounter (Signed)
Pt. Came into facility requesting to speak with a nurse b/c he states that he was not prescribed DM medication.  Pt. Would like to know why he was not prescribed any pills if he takes the pills in the day time and takes insulin every night.  Pt. Was told that the nurse was with pt. And would not be able to speak with him.  Pt. Stated he would stay until he speak with the nurse. Please f/u.

## 2016-03-09 NOTE — Telephone Encounter (Signed)
Patient called and reported that a sugar of 343... Patient wants to be back on glipizde and has questions about the medication. Please follow up.

## 2016-03-11 MED ORDER — AMLODIPINE BESYLATE 5 MG PO TABS: 5.0000 mg | ORAL_TABLET | Freq: Every day | ORAL | Status: DC

## 2016-03-11 MED ORDER — INSULIN GLARGINE 100 UNIT/ML SOLOSTAR PEN: 15.0000 [IU] | PEN_INJECTOR | Freq: Every day | SUBCUTANEOUS | Status: DC

## 2016-03-11 NOTE — Telephone Encounter (Signed)
Attempted to advise:Please explain to patient that lantus works for 24 hrs Just because it is dosed at night does not mean it is not working during the day.  His lantus dose will be raised if he has high sugars. If he has high sugars he should call.  Diabetes blood sugar goals  Fasting (in AM before breakfast, 8 hrs of no eating or drinking (except water or unsweetened coffee or tea): 90-110 2 hrs after meals: < 160,  No low sugars: nothing < 70

## 2016-03-11 NOTE — Telephone Encounter (Signed)
Please explain to patient that lantus works for 24 hrs Just because it is dosed at night does not mean it is not working during the day.  His lantus dose will be raised if he has high sugars. If he has high sugars he should call.  Diabetes blood sugar goals  Fasting (in AM before breakfast, 8 hrs of no eating or drinking (except water or unsweetened coffee or tea): 90-110 2 hrs after meals: < 160,   No low sugars: nothing < 70

## 2016-03-11 NOTE — Telephone Encounter (Signed)
Patient states his blood sugar in the am is never below 160 mg/dl. This morning it was 163 mg/dl and this is the lowest its ever been.  On other days it has been: 175, 288, 343, up to 350 mg/dl. Rn advised will notify Dr Adrian Blackwater.  Patient verbalized understanding.

## 2016-03-11 NOTE — Addendum Note (Signed)
Addended by: Boykin Nearing on: 03/11/2016 12:21 PM   Modules accepted: Orders

## 2016-03-11 NOTE — Addendum Note (Signed)
Addended by: Boykin Nearing on: 03/11/2016 04:35 PM   Modules accepted: Orders

## 2016-03-11 NOTE — Telephone Encounter (Signed)
Increase lantus to 15 U nightly Call with update in blood sugars in 1 week, if still elevated will increase to 20 U

## 2016-03-11 NOTE — Assessment & Plan Note (Signed)
Correction Potassium and Cr elevated GFR is low Renal function has declined since last check this can be due to HTN.  Stop lisinopril Add norvasc 5 mg daily for BP control And repeat labs in 1 week If labs remain abnormal will need renal ultrasound and referral to nephrologist

## 2016-03-12 ENCOUNTER — Telehealth: Payer: Self-pay

## 2016-03-12 NOTE — Telephone Encounter (Signed)
Contacted patient to go over lab work patient is aware of labs. Patient is aware of the changes to his medication. Pt states he will try and come by to get his medication so he can start it today if not today then he will come get it on Monday.

## 2016-03-12 NOTE — Telephone Encounter (Signed)
Rn advised patient per Dr. Adrian Blackwater:  Increase lantus to 15 U nightly Call with update in blood sugars in 1 week, if still elevated will increase to 20 U            Patient verbalized understanding.

## 2016-04-02 ENCOUNTER — Ambulatory Visit (INDEPENDENT_AMBULATORY_CARE_PROVIDER_SITE_OTHER): Payer: Medicaid Other | Admitting: Neurology

## 2016-04-02 ENCOUNTER — Encounter: Payer: Self-pay | Admitting: Neurology

## 2016-04-02 VITALS — BP 157/86 | HR 72 | Ht 74.0 in | Wt 193.0 lb

## 2016-04-02 DIAGNOSIS — F1910 Other psychoactive substance abuse, uncomplicated: Secondary | ICD-10-CM | POA: Insufficient documentation

## 2016-04-02 DIAGNOSIS — R569 Unspecified convulsions: Secondary | ICD-10-CM | POA: Diagnosis not present

## 2016-04-02 HISTORY — DX: Other psychoactive substance abuse, uncomplicated: F19.10

## 2016-04-02 MED ORDER — DIVALPROEX SODIUM ER 500 MG PO TB24: ORAL_TABLET | ORAL | 1 refills | Status: DC

## 2016-04-02 MED FILL — DIVALPROEX SOD 500 MG TAB D: 500 | 30 days supply | Qty: 60 | Fill #0

## 2016-04-02 NOTE — Patient Instructions (Addendum)
We will taper off of the Depakote slowly over the next 6 weeks, and you are to stay on the Vimpat 200 mg tablet.   Epilepsy Epilepsy is a disorder in which a person has repeated seizures over time. A seizure is a release of abnormal electrical activity in the brain. Seizures can cause a change in attention, behavior, or the ability to remain awake and alert (altered mental status). Seizures often involve uncontrollable shaking (convulsions).  Most people with epilepsy lead normal lives. However, people with epilepsy are at an increased risk of falls, accidents, and injuries. Therefore, it is important to begin treatment right away. CAUSES  Epilepsy has many possible causes. Anything that disturbs the normal pattern of brain cell activity can lead to seizures. This may include:   Head injury.  Birth trauma.  High fever as a child.  Stroke.  Bleeding into or around the brain.  Certain drugs.  Prolonged low oxygen, such as what occurs after CPR efforts.  Abnormal brain development.  Certain illnesses, such as meningitis, encephalitis (brain infection), malaria, and other infections.  An imbalance of nerve signaling chemicals (neurotransmitters).  SIGNS AND SYMPTOMS  The symptoms of a seizure can vary greatly from one person to another. Right before a seizure, you may have a warning (aura) that a seizure is about to occur. An aura may include the following symptoms:  Fear or anxiety.  Nausea.  Feeling like the room is spinning (vertigo).  Vision changes, such as seeing flashing lights or spots. Common symptoms during a seizure include:  Abnormal sensations, such as an abnormal smell or a bitter taste in the mouth.   Sudden, general body stiffness.   Convulsions that involve rhythmic jerking of the face, arm, or leg on one or both sides.   Sudden change in consciousness.   Appearing to be awake but not responding.   Appearing to be asleep but cannot be awakened.    Grimacing, chewing, lip smacking, drooling, tongue biting, or loss of bowel or bladder control. After a seizure, you may feel sleepy for a while. DIAGNOSIS  Your health care provider will ask about your symptoms and take a medical history. Descriptions from any witnesses to your seizures will be very helpful in the diagnosis. A physical exam, including a detailed neurological exam, is necessary. Various tests may be done, such as:   An electroencephalogram (EEG). This is a painless test of your brain waves. In this test, a diagram is created of your brain waves. These diagrams can be interpreted by a specialist.  An MRI of the brain.   A CT scan of the brain.   A spinal tap (lumbar puncture, LP).  Blood tests to check for signs of infection or abnormal blood chemistry. TREATMENT  There is no cure for epilepsy, but it is generally treatable. Once epilepsy is diagnosed, it is important to begin treatment as soon as possible. For most people with epilepsy, seizures can be controlled with medicines. The following may also be used:  A pacemaker for the brain (vagus nerve stimulator) can be used for people with seizures that are not well controlled by medicine.  Surgery on the brain. For some people, epilepsy eventually goes away. HOME CARE INSTRUCTIONS   Follow your health care provider's recommendations on driving and safety in normal activities.  Get enough rest. Lack of sleep can cause seizures.  Only take over-the-counter or prescription medicines as directed by your health care provider. Take any prescribed medicine exactly as directed.  Avoid any known triggers of your seizures.  Keep a seizure diary. Record what you recall about any seizure, especially any possible trigger.   Make sure the people you live and work with know that you are prone to seizures. They should receive instructions on how to help you. In general, a witness to a seizure should:   Cushion your head  and body.   Turn you on your side.   Avoid unnecessarily restraining you.   Not place anything inside your mouth.   Call for emergency medical help if there is any question about what has occurred.   Follow up with your health care provider as directed. You may need regular blood tests to monitor the levels of your medicine.  SEEK MEDICAL CARE IF:   You develop signs of infection or other illness. This might increase the risk of a seizure.   You seem to be having more frequent seizures.   Your seizure pattern is changing.  SEEK IMMEDIATE MEDICAL CARE IF:   You have a seizure that does not stop after a few moments.   You have a seizure that causes any difficulty in breathing.   You have a seizure that results in a very severe headache.   You have a seizure that leaves you with the inability to speak or use a part of your body.    This information is not intended to replace advice given to you by your health care provider. Make sure you discuss any questions you have with your health care provider.   Document Released: 08/16/2005 Document Revised: 06/06/2013 Document Reviewed: 03/28/2013 Elsevier Interactive Patient Education [employer].
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
## 2016-04-05 MED FILL — ACCU-CHEK AVIVA PLUS TEST S: 25 days supply | Qty: 100 | Fill #0

## 2016-04-14 ENCOUNTER — Encounter (HOSPITAL_COMMUNITY): Payer: Self-pay | Admitting: Emergency Medicine

## 2016-04-14 ENCOUNTER — Emergency Department (HOSPITAL_COMMUNITY)
Admission: EM | Admit: 2016-04-14 | Discharge: 2016-04-14 | Disposition: A | Discharge status: Home / Self Care | Payer: Medicaid Other | Attending: Emergency Medicine | Admitting: Emergency Medicine

## 2016-04-14 ENCOUNTER — Emergency Department (HOSPITAL_COMMUNITY): Payer: Medicaid Other

## 2016-04-14 DIAGNOSIS — Z87891 Personal history of nicotine dependence: Secondary | ICD-10-CM | POA: Insufficient documentation

## 2016-04-14 DIAGNOSIS — I1310 Hypertensive heart and chronic kidney disease without heart failure, with stage 1 through stage 4 chronic kidney disease, or unspecified chronic kidney disease: Secondary | ICD-10-CM | POA: Diagnosis not present

## 2016-04-14 DIAGNOSIS — E1122 Type 2 diabetes mellitus with diabetic chronic kidney disease: Secondary | ICD-10-CM | POA: Diagnosis not present

## 2016-04-14 DIAGNOSIS — N183 Chronic kidney disease, stage 3 (moderate): Secondary | ICD-10-CM | POA: Insufficient documentation

## 2016-04-14 DIAGNOSIS — I509 Heart failure, unspecified: Secondary | ICD-10-CM | POA: Diagnosis not present

## 2016-04-14 DIAGNOSIS — Z79899 Other long term (current) drug therapy: Secondary | ICD-10-CM | POA: Diagnosis not present

## 2016-04-14 DIAGNOSIS — S99921A Unspecified injury of right foot, initial encounter: Secondary | ICD-10-CM | POA: Insufficient documentation

## 2016-04-14 DIAGNOSIS — Y999 Unspecified external cause status: Secondary | ICD-10-CM | POA: Insufficient documentation

## 2016-04-14 DIAGNOSIS — Y929 Unspecified place or not applicable: Secondary | ICD-10-CM | POA: Diagnosis not present

## 2016-04-14 DIAGNOSIS — Y939 Activity, unspecified: Secondary | ICD-10-CM | POA: Insufficient documentation

## 2016-04-14 DIAGNOSIS — Z794 Long term (current) use of insulin: Secondary | ICD-10-CM | POA: Diagnosis not present

## 2016-04-14 DIAGNOSIS — Z23 Encounter for immunization: Secondary | ICD-10-CM | POA: Diagnosis not present

## 2016-04-14 DIAGNOSIS — W1789XA Other fall from one level to another, initial encounter: Secondary | ICD-10-CM | POA: Diagnosis not present

## 2016-04-14 DIAGNOSIS — Z7982 Long term (current) use of aspirin: Secondary | ICD-10-CM | POA: Diagnosis not present

## 2016-04-14 MED ORDER — TETANUS-DIPHTH-ACELL PERTUSSIS 5-2.5-18.5 LF-MCG/0.5 IM SUSP
0.5000 mL | Freq: Once | INTRAMUSCULAR | Status: AC
  Administered 2016-04-14: 0.5 mL via INTRAMUSCULAR
  Filled 2016-04-14: qty 0.5

## 2016-04-14 NOTE — ED Triage Notes (Signed)
Pt c/o R great toe pain after a fence fell on his foot and pt sts a nail went all the way through his toe. Pt sts the nail is no longer in there. Pt unsure when last tetanus shot was. Bleeding controlled at this time. A&Ox4 and ambulatory.

## 2016-04-14 NOTE — ED Notes (Signed)
Patient transported to X-ray 
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
## 2016-04-15 ENCOUNTER — Ambulatory Visit: Payer: Medicaid Other | Attending: Family Medicine | Admitting: Family Medicine

## 2016-04-15 ENCOUNTER — Encounter: Payer: Self-pay | Admitting: Family Medicine

## 2016-04-15 VITALS — BP 131/65 | HR 67 | Temp 99.0°F | Ht 74.0 in | Wt 200.2 lb

## 2016-04-15 DIAGNOSIS — E1122 Type 2 diabetes mellitus with diabetic chronic kidney disease: Secondary | ICD-10-CM | POA: Diagnosis not present

## 2016-04-15 DIAGNOSIS — Z79899 Other long term (current) drug therapy: Secondary | ICD-10-CM | POA: Insufficient documentation

## 2016-04-15 DIAGNOSIS — B351 Tinea unguium: Secondary | ICD-10-CM | POA: Diagnosis not present

## 2016-04-15 DIAGNOSIS — N183 Chronic kidney disease, stage 3 unspecified: Secondary | ICD-10-CM

## 2016-04-15 DIAGNOSIS — Z7982 Long term (current) use of aspirin: Secondary | ICD-10-CM | POA: Diagnosis not present

## 2016-04-15 DIAGNOSIS — S91131D Puncture wound without foreign body of right great toe without damage to nail, subsequent encounter: Secondary | ICD-10-CM

## 2016-04-15 DIAGNOSIS — Z87891 Personal history of nicotine dependence: Secondary | ICD-10-CM | POA: Diagnosis not present

## 2016-04-15 DIAGNOSIS — Z794 Long term (current) use of insulin: Secondary | ICD-10-CM

## 2016-04-15 DIAGNOSIS — Z0000 Encounter for general adult medical examination without abnormal findings: Secondary | ICD-10-CM

## 2016-04-15 DIAGNOSIS — X58XXXA Exposure to other specified factors, initial encounter: Secondary | ICD-10-CM | POA: Insufficient documentation

## 2016-04-15 DIAGNOSIS — S91131A Puncture wound without foreign body of right great toe without damage to nail, initial encounter: Secondary | ICD-10-CM | POA: Insufficient documentation

## 2016-04-15 DIAGNOSIS — Z23 Encounter for immunization: Secondary | ICD-10-CM | POA: Diagnosis not present

## 2016-04-15 LAB — GLUCOSE, POCT (MANUAL RESULT ENTRY): POC Glucose: 177 mg/dl — AB (ref 70–99)

## 2016-04-15 MED ORDER — ZOSTER VACCINE LIVE 19400 UNT/0.65ML ~~LOC~~ SUSR: 0.6500 mL | Freq: Once | SUBCUTANEOUS | 0 refills | Status: AC

## 2016-04-15 NOTE — Progress Notes (Signed)
Subjective:  Patient ID: Mark [name], male    DOB: 1951-05-09  Age: 65 y.o. MRN: [id number]  CC: Foot Pain   HPI Mark [name] has HTN, Diabetes, s/p L BKA, new onset seizure in setting of hypoglycemia he presents for    1. R foot pain: started yesterday when he was building a privacy fence and a piece fell on his R great toe. He reports a nail wen through his toe. He wen to the ED yesterday. He received an updated tetanus shot. DG foot was negative for foreign body and fracture. There was arthritic changes and possible epidermoid inclusion cyst of hte tuft of the distal phalanx of the second toe.   Social History  Substance Use Topics  . Smoking status: Former Smoker    Quit date: 08/03/2013  . Smokeless tobacco: Never Used  . Alcohol use No     Comment: has quit all alcohol   Outpatient Medications Prior to Visit  Medication Sig Dispense Refill  . ACCU-CHEK AVIVA PLUS test strip USE AS INSTRUCTED 100 each 12  . amLODipine (NORVASC) 5 MG tablet Take 1 tablet (5 mg total) by mouth daily. 30 tablet 3  . aspirin 81 MG EC tablet Take 1 tablet (81 mg total) by mouth daily. 90 tablet 3  . atorvastatin (LIPITOR) 20 MG tablet Take 1 tablet (20 mg total) by mouth daily. 90 tablet 3  . B-D UF III MINI PEN NEEDLES 31G X 5 MM MISC USE TO INJECT INSULIN AS DIRECTED BY PHYSICIAN 100 each 12  . Blood Glucose Monitoring Suppl (ACCU-CHEK AVIVA PLUS) W/DEVICE KIT Use as prescribed TID before meals and QHS 1 kit 0  . carvedilol (COREG) 6.25 MG tablet Take 1 tablet (6.25 mg total) by mouth 2 (two) times daily with a meal. 180 tablet 3  . divalproex (DEPAKOTE ER) 500 MG 24 hr tablet Take one tablet twice a day for 3 weeks and then take one tablet daily for 3 weeks, then stop 60 tablet 1  . ferrous gluconate (FERGON) 324 MG tablet Take 1 tablet (324 mg total) by mouth 2 (two) times daily with a meal. 60 tablet 2  . glucose blood (ACCU-CHEK AVIVA) test strip Use as instructed 100 each 12  . Insulin  Glargine (LANTUS SOLOSTAR) 100 UNIT/ML Solostar Pen Inject 15 Units into the skin daily at 10 pm. 10 pen 3  . Insulin Syringe-Needle U-100 (INSULIN SYRINGE .5CC/30GX5/16") 30G X 5/16" 0.5 ML MISC Check blood sugar TID & QHS 100 each 2  . lacosamide (VIMPAT) 200 MG TABS tablet Take 1 tablet (200 mg total) by mouth 2 (two) times daily. 60 tablet 2  . Lancets (ACCU-CHEK SOFT TOUCH) lancets Use as instructed 100 each 12   No facility-administered medications prior to visit.     ROS Review of Systems  Constitutional: Negative for chills, fatigue, fever and unexpected weight change.  Eyes: Negative for visual disturbance.  Respiratory: Negative for cough and shortness of breath.   Cardiovascular: Negative for chest pain, palpitations and leg swelling.  Gastrointestinal: Negative for abdominal pain, blood in stool, constipation, diarrhea, nausea and vomiting.  Endocrine: Negative for polydipsia, polyphagia and polyuria.  Musculoskeletal: Negative for arthralgias, back pain, gait problem, myalgias and neck pain.  Skin: Positive for wound. Negative for rash.  Allergic/Immunologic: Negative for immunocompromised state.  Neurological: Positive for seizures (12/2015).  Hematological: Negative for adenopathy. Does not bruise/bleed easily.  Psychiatric/Behavioral: Negative for dysphoric mood, sleep disturbance and suicidal ideas. The patient is  not nervous/anxious.     Objective:  BP 131/65 (BP Location: Right Arm, Patient Position: Sitting, Cuff Size: Large)   Pulse 67   Temp 99 F (37.2 C) (Oral)   Ht 6' 2"  (1.88 m)   Wt 200 lb 3.2 oz (90.8 kg)   SpO2 98%   BMI 25.70 kg/m  Physical Exam  Constitutional: He appears well-developed and well-nourished. No distress.  HENT:  Head: Normocephalic and atraumatic.  Neck: Normal range of motion. Neck supple.  Cardiovascular: Normal rate, regular rhythm, normal heart sounds and intact distal pulses.   Pulses:      Dorsalis pedis pulses are 2+ on the  right side.  Pulmonary/Chest: Effort normal and breath sounds normal.  Musculoskeletal: He exhibits no edema.       Feet:  L BKA  Thickened and yellow nails R foot   Neurological: He is alert.  Skin: Skin is warm and dry. No rash noted. No erythema.  Psychiatric: He has a normal mood and affect.   Lab Results  Component Value Date   HGBA1C 7.8 03/08/2016   CBG 177  Assessment & Plan:  Mark [name] was seen today for foot pain.  Diagnoses and all orders for this visit:  Healthcare maintenance -     Zoster Vaccine Live, PF, (ZOSTAVAX) 49753 UNT/0.65ML injection; Inject 19,400 Units into the skin once. -     Flu Vaccine QUAD 36+ mos IM  Type 2 diabetes mellitus with stage 3 chronic kidney disease, with long-term current use of insulin (HCC) -     Glucose (CBG)  Puncture wound of great toe of right foot, subsequent encounter  Onychomycosis of toenail -     Ambulatory referral to Podiatry  Need for shingles vaccine -     Zoster Vaccine Live, PF, (ZOSTAVAX) 00511 UNT/0.65ML injection; Inject 19,400 Units into the skin once.   There are no diagnoses linked to this encounter.  No orders of the defined types were placed in this encounter.   Follow-up: Return in 5 days (on 04/20/2016) for wound check .   Boykin Nearing MD

## 2016-04-15 NOTE — Patient Instructions (Addendum)
Mark [name] was seen today for foot pain.  Diagnoses and all orders for this visit:  Type 2 diabetes mellitus with stage 3 chronic kidney disease, with long-term current use of insulin (HCC) -     Glucose (CBG)  Puncture wound of great toe of right foot, subsequent encounter  Onychomycosis of toenail -     Ambulatory referral to [facility] maintenance -     Zoster Vaccine Live, PF, (ZOSTAVAX) 91478 UNT/0.65ML injection; Inject 19,400 Units into the skin once.  Need for shingles vaccine -     Zoster Vaccine Live, PF, (ZOSTAVAX) 29562 UNT/0.65ML injection; Inject 19,400 Units into the skin once.    Keep toe wound clean and dry with dressing changes 2-3 times a day Elevated foot when sitting or lying down Ice for for 20 minutes 2-3 times a day to reduce swelling.  If you develop worsening swelling, redness, drainage or pain in foot present here or urgent care right away  F/u on next Tuesday 04/20/16 at 9 AM for wound check, ok to double book  Dr. Adrian Blackwater

## 2016-04-15 NOTE — Progress Notes (Signed)
C/C: right foot pain

## 2016-04-20 ENCOUNTER — Encounter: Payer: Self-pay | Admitting: Family Medicine

## 2016-04-20 ENCOUNTER — Ambulatory Visit: Payer: Medicaid Other | Attending: Family Medicine | Admitting: Family Medicine

## 2016-04-20 VITALS — BP 164/88 | HR 67 | Temp 98.0°F | Ht 74.0 in | Wt 205.0 lb

## 2016-04-20 DIAGNOSIS — Z87891 Personal history of nicotine dependence: Secondary | ICD-10-CM | POA: Insufficient documentation

## 2016-04-20 DIAGNOSIS — N183 Chronic kidney disease, stage 3 unspecified: Secondary | ICD-10-CM

## 2016-04-20 DIAGNOSIS — X58XXXD Exposure to other specified factors, subsequent encounter: Secondary | ICD-10-CM | POA: Insufficient documentation

## 2016-04-20 DIAGNOSIS — Z794 Long term (current) use of insulin: Secondary | ICD-10-CM | POA: Insufficient documentation

## 2016-04-20 DIAGNOSIS — M7989 Other specified soft tissue disorders: Secondary | ICD-10-CM | POA: Diagnosis not present

## 2016-04-20 DIAGNOSIS — Z7982 Long term (current) use of aspirin: Secondary | ICD-10-CM | POA: Insufficient documentation

## 2016-04-20 DIAGNOSIS — E875 Hyperkalemia: Secondary | ICD-10-CM | POA: Diagnosis not present

## 2016-04-20 DIAGNOSIS — Z79899 Other long term (current) drug therapy: Secondary | ICD-10-CM | POA: Diagnosis not present

## 2016-04-20 DIAGNOSIS — E1122 Type 2 diabetes mellitus with diabetic chronic kidney disease: Secondary | ICD-10-CM | POA: Diagnosis not present

## 2016-04-20 DIAGNOSIS — S91131D Puncture wound without foreign body of right great toe without damage to nail, subsequent encounter: Secondary | ICD-10-CM | POA: Diagnosis not present

## 2016-04-20 LAB — CBC
HCT: 25.8 % — ABNORMAL LOW (ref 38.5–50.0)
Hemoglobin: 8.2 g/dL — ABNORMAL LOW (ref 13.2–17.1)
MCH: 28.6 pg (ref 27.0–33.0)
MCHC: 31.8 g/dL — ABNORMAL LOW (ref 32.0–36.0)
MCV: 89.9 fL (ref 80.0–100.0)
MPV: 10.2 fL (ref 7.5–12.5)
Platelets: 365 10*3/uL (ref 140–400)
RBC: 2.87 MIL/uL — ABNORMAL LOW (ref 4.20–5.80)
RDW: 14.3 % (ref 11.0–15.0)
WBC: 7.7 10*3/uL (ref 3.8–10.8)

## 2016-04-20 LAB — BASIC METABOLIC PANEL WITH GFR
BUN: 24 mg/dL (ref 7–25)
CO2: 25 mmol/L (ref 20–31)
Calcium: 8.3 mg/dL — ABNORMAL LOW (ref 8.6–10.3)
Chloride: 109 mmol/L (ref 98–110)
Creat: 1.53 mg/dL — ABNORMAL HIGH (ref 0.70–1.25)
GFR, Est African American: 55 mL/min — ABNORMAL LOW (ref >=60)
GFR, Est Non African American: 47 mL/min — ABNORMAL LOW (ref >=60)
Glucose, Bld: 123 mg/dL — ABNORMAL HIGH (ref 65–99)
Potassium: 4.1 mmol/L (ref 3.5–5.3)
Sodium: 142 mmol/L (ref 135–146)

## 2016-04-20 LAB — D-DIMER, QUANTITATIVE: D-Dimer, Quant: 0.44 mcg/mL FEU (ref <0.50)

## 2016-04-20 LAB — GLUCOSE, POCT (MANUAL RESULT ENTRY): POC Glucose: 131 mg/dl — AB (ref 70–99)

## 2016-04-20 MED ORDER — FUROSEMIDE 40 MG PO TABS: 40.0000 mg | ORAL_TABLET | Freq: Every day | ORAL | 0 refills | Status: DC

## 2016-04-20 MED ORDER — DOXYCYCLINE HYCLATE 100 MG PO TABS: 100.0000 mg | ORAL_TABLET | Freq: Two times a day (BID) | ORAL | 0 refills | Status: DC

## 2016-04-20 MED ORDER — CLINDAMYCIN HCL 300 MG PO CAPS: 300.0000 mg | ORAL_CAPSULE | Freq: Four times a day (QID) | ORAL | 0 refills | Status: DC

## 2016-04-20 MED FILL — BD PEN NDL MINI 31GX5MM: 31G X 5 MM | 25 days supply | Qty: 100 | Fill #1

## 2016-04-20 MED FILL — FUROSEMIDE 20 MG TABLET: 20 | 30 days supply | Qty: 60 | Fill #0

## 2016-04-20 MED FILL — CLINDAMYCIN HCL 300 MG CAP: 300 | 10 days supply | Qty: 40 | Fill #0

## 2016-04-20 MED FILL — DOXYCYCLINE 100 MG TABLET: 100 | 10 days supply | Qty: 20 | Fill #0

## 2016-04-20 NOTE — Progress Notes (Signed)
Subjective:  Patient ID: Mark [name], male    DOB: 1951/03/06  Age: 65 y.o. MRN: [id number]  CC: No chief complaint on file.   HPI Mark [name] has HTN, Diabetes, CKD,  s/p L BKA, new onset seizure in setting of hypoglycemia he presents for    1. R foot pain: started yesterday on 04/14/16 when he was building a privacy fence and a piece fell on his R great toe. He reports a nail went through his toe. He went to the ED on day of injury. He received an updated tetanus shot. DG foot was negative for foreign body and fracture. There was arthritic changes and possible epidermoid inclusion cyst of hte tuft of the distal phalanx of the second toe. He had swelling and redness of R great toe with serous drainage from the wound.   Today, he reports redness and improved and drainage has stopped. He has worsening swelling of R leg up to below knee over past 4 days. No pain or fever.   Of note, last month his ARB was stopped and replaced with norvasc due to hyperkalemia in setting of elevated Cr. He reports some cough. No CP or SOB.   Social History  Substance Use Topics  . Smoking status: Former Smoker    Quit date: 08/03/2013  . Smokeless tobacco: Never Used  . Alcohol use No     Comment: has quit all alcohol   Outpatient Medications Prior to Visit  Medication Sig Dispense Refill  . ACCU-CHEK AVIVA PLUS test strip USE AS INSTRUCTED 100 each 12  . amLODipine (NORVASC) 5 MG tablet Take 1 tablet (5 mg total) by mouth daily. 30 tablet 3  . aspirin 81 MG EC tablet Take 1 tablet (81 mg total) by mouth daily. 90 tablet 3  . atorvastatin (LIPITOR) 20 MG tablet Take 1 tablet (20 mg total) by mouth daily. 90 tablet 3  . B-D UF III MINI PEN NEEDLES 31G X 5 MM MISC USE TO INJECT INSULIN AS DIRECTED BY PHYSICIAN 100 each 12  . Blood Glucose Monitoring Suppl (ACCU-CHEK AVIVA PLUS) W/DEVICE KIT Use as prescribed TID before meals and QHS 1 kit 0  . carvedilol (COREG) 6.25 MG tablet Take 1 tablet (6.25  mg total) by mouth 2 (two) times daily with a meal. 180 tablet 3  . divalproex (DEPAKOTE ER) 500 MG 24 hr tablet Take one tablet twice a day for 3 weeks and then take one tablet daily for 3 weeks, then stop 60 tablet 1  . ferrous gluconate (FERGON) 324 MG tablet Take 1 tablet (324 mg total) by mouth 2 (two) times daily with a meal. 60 tablet 2  . glucose blood (ACCU-CHEK AVIVA) test strip Use as instructed 100 each 12  . Insulin Glargine (LANTUS SOLOSTAR) 100 UNIT/ML Solostar Pen Inject 15 Units into the skin daily at 10 pm. 10 pen 3  . Insulin Syringe-Needle U-100 (INSULIN SYRINGE .5CC/30GX5/16") 30G X 5/16" 0.5 ML MISC Check blood sugar TID & QHS 100 each 2  . lacosamide (VIMPAT) 200 MG TABS tablet Take 1 tablet (200 mg total) by mouth 2 (two) times daily. 60 tablet 2  . Lancets (ACCU-CHEK SOFT TOUCH) lancets Use as instructed 100 each 12   No facility-administered medications prior to visit.     ROS Review of Systems  Constitutional: Negative for chills, fatigue, fever and unexpected weight change.  Eyes: Negative for visual disturbance.  Respiratory: Negative for cough and shortness of breath.   Cardiovascular:  Positive for leg swelling. Negative for chest pain and palpitations.  Gastrointestinal: Negative for abdominal pain, blood in stool, constipation, diarrhea, nausea and vomiting.  Endocrine: Negative for polydipsia, polyphagia and polyuria.  Musculoskeletal: Negative for arthralgias, back pain, gait problem, myalgias and neck pain.  Skin: Positive for wound. Negative for rash.  Allergic/Immunologic: Negative for immunocompromised state.  Neurological: Positive for seizures (12/2015).  Hematological: Negative for adenopathy. Does not bruise/bleed easily.  Psychiatric/Behavioral: Negative for dysphoric mood, sleep disturbance and suicidal ideas. The patient is not nervous/anxious.     Objective:  BP (!) 164/88 (BP Location: Right Arm, Patient Position: Sitting, Cuff Size: Large)    Pulse 67   Temp 98 F (36.7 C) (Oral)   Ht 6' 2"  (1.88 m)   Wt 205 lb (93 kg)   SpO2 100%   BMI 26.32 kg/m   BP Readings from Last 3 Encounters:  04/20/16 (!) 164/88  04/15/16 131/65  04/14/16 176/87    Physical Exam  Constitutional: He appears well-developed and well-nourished. No distress.  HENT:  Head: Normocephalic and atraumatic.  Neck: Normal range of motion. Neck supple.  Cardiovascular: Normal rate, regular rhythm, normal heart sounds and intact distal pulses.   Pulses:      Dorsalis pedis pulses are 2+ on the right side.  Pulmonary/Chest: Effort normal and breath sounds normal.  Musculoskeletal: He exhibits edema (2 + pitting edema in R leg ).       Legs:      Feet:  L BKA  Thickened and yellow nails R foot   Neurological: He is alert.  Skin: Skin is warm and dry. No rash noted. No erythema.  Psychiatric: He has a normal mood and affect.   Lab Results  Component Value Date   HGBA1C 7.8 03/08/2016   CBG 131   Chemistry      Component Value Date/Time   NA 139 03/08/2016 1228   NA 141 01/14/2016   K 5.8 (H) 03/08/2016 1228   CL 110 03/08/2016 1228   CO2 18 (L) 03/08/2016 1228   BUN 53 (H) 03/08/2016 1228   BUN 23 (A) 01/14/2016   CREATININE 2.44 (H) 03/08/2016 1228   GLU 175 01/14/2016      Component Value Date/Time   CALCIUM 9.1 03/08/2016 1228   ALKPHOS 90 01/14/2016   AST 17 01/14/2016   ALT 14 01/14/2016   BILITOT 0.7 01/07/2016 0803      Assessment & Plan:  Tarun was seen today for follow-up.  Diagnoses and all orders for this visit:  Right leg swelling -     CBC -     BASIC METABOLIC PANEL WITH GFR -     D-dimer, quantitative (not at [facility]) -     furosemide (LASIX) 40 MG tablet; Take 1 tablet (40 mg total) by mouth daily. -     clindamycin (CLEOCIN) 300 MG capsule; Take 1 capsule (300 mg total) by mouth 4 (four) times daily. -     doxycycline (VIBRA-TABS) 100 MG tablet; Take 1 tablet (100 mg total) by mouth 2 (two) times  daily.  Type 2 diabetes mellitus with stage 3 chronic kidney disease, with long-term current use of insulin (HCC) -     Glucose (CBG)  Puncture wound of great toe of right foot, subsequent encounter -     CBC -     BASIC METABOLIC PANEL WITH GFR -     D-dimer, quantitative (not at [facility]) -     furosemide (LASIX) 40 MG  tablet; Take 1 tablet (40 mg total) by mouth daily. -     clindamycin (CLEOCIN) 300 MG capsule; Take 1 capsule (300 mg total) by mouth 4 (four) times daily. -     doxycycline (VIBRA-TABS) 100 MG tablet; Take 1 tablet (100 mg total) by mouth 2 (two) times daily.  Hyperkalemia -     BASIC METABOLIC PANEL WITH GFR  CKD (chronic kidney disease) stage 3, GFR 30-59 ml/min -     BASIC METABOLIC PANEL WITH GFR   There are no diagnoses linked to this encounter.  No orders of the defined types were placed in this encounter.   Follow-up: No Follow-up on file.   Boykin Nearing MD

## 2016-04-20 NOTE — Assessment & Plan Note (Signed)
R leg swelling in diabetic patient on week after puncture wound to R foot Although exam is most consistent with swelling likely related to fluid retention in setting of CKD patient is high risk for cellulitis  Plan: Doxy and clinda x 10 days CBC D dimer BMP with GFR Reviewed s/s to prompt patient to seek immediate medical attention

## 2016-04-20 NOTE — Patient Instructions (Addendum)
Bomani was seen today for follow-up.  Diagnoses and all orders for this visit:  Right leg swelling -     CBC -     BASIC METABOLIC PANEL WITH GFR -     D-dimer, quantitative (not at [facility]) -     furosemide (LASIX) 40 MG tablet; Take 1 tablet (40 mg total) by mouth daily. -     clindamycin (CLEOCIN) 300 MG capsule; Take 1 capsule (300 mg total) by mouth 4 (four) times daily. -     doxycycline (VIBRA-TABS) 100 MG tablet; Take 1 tablet (100 mg total) by mouth 2 (two) times daily.  Type 2 diabetes mellitus with stage 3 chronic kidney disease, with long-term current use of insulin (HCC) -     Glucose (CBG)  Puncture wound of great toe of right foot, subsequent encounter -     CBC -     BASIC METABOLIC PANEL WITH GFR -     D-dimer, quantitative (not at [facility]) -     furosemide (LASIX) 40 MG tablet; Take 1 tablet (40 mg total) by mouth daily. -     clindamycin (CLEOCIN) 300 MG capsule; Take 1 capsule (300 mg total) by mouth 4 (four) times daily. -     doxycycline (VIBRA-TABS) 100 MG tablet; Take 1 tablet (100 mg total) by mouth 2 (two) times daily.  Hyperkalemia -     BASIC METABOLIC PANEL WITH GFR  CKD (chronic kidney disease) stage 3, GFR 30-59 ml/min -     BASIC METABOLIC PANEL WITH GFR   Go to ED if you develop pain, redness in legs or fever   F/u in 7-10 days for leg swelling check  Dr. Adrian Blackwater

## 2016-04-20 NOTE — Assessment & Plan Note (Signed)
CKD with recent elevated Cr and K+. Now with leg swelling in R that is not convincingly inflammatory  Plan: Lasix Repeat BMP with GFR today

## 2016-04-22 ENCOUNTER — Telehealth: Payer: Self-pay

## 2016-04-22 ENCOUNTER — Other Ambulatory Visit: Payer: Self-pay | Admitting: Family Medicine

## 2016-04-22 DIAGNOSIS — N183 Chronic kidney disease, stage 3 unspecified: Secondary | ICD-10-CM

## 2016-04-22 NOTE — Telephone Encounter (Signed)
Patient was informed of his results and his appointment was made for */28 @9 :45 for his lab work.

## 2016-04-26 ENCOUNTER — Ambulatory Visit: Payer: Medicaid Other | Attending: Family Medicine

## 2016-04-26 DIAGNOSIS — N183 Chronic kidney disease, stage 3 unspecified: Secondary | ICD-10-CM

## 2016-04-26 LAB — BASIC METABOLIC PANEL WITH GFR
BUN: 29 mg/dL — ABNORMAL HIGH (ref 7–25)
CO2: 24 mmol/L (ref 20–31)
Calcium: 8.8 mg/dL (ref 8.6–10.3)
Chloride: 109 mmol/L (ref 98–110)
Creat: 1.88 mg/dL — ABNORMAL HIGH (ref 0.70–1.25)
GFR, Est African American: 43 mL/min — ABNORMAL LOW (ref >=60)
GFR, Est Non African American: 37 mL/min — ABNORMAL LOW (ref >=60)
Glucose, Bld: 156 mg/dL — ABNORMAL HIGH (ref 65–99)
Potassium: 4.3 mmol/L (ref 3.5–5.3)
Sodium: 145 mmol/L (ref 135–146)

## 2016-04-26 NOTE — Progress Notes (Signed)
Patient states no allergies to rubbing alcohol or latex.

## 2016-04-26 NOTE — Patient Instructions (Signed)
Patient is aware of receiving a FU call regarding results. 

## 2016-04-27 ENCOUNTER — Encounter: Payer: Self-pay | Admitting: Podiatry

## 2016-04-27 ENCOUNTER — Ambulatory Visit: Payer: Medicaid Other | Admitting: Podiatry

## 2016-04-27 ENCOUNTER — Ambulatory Visit (INDEPENDENT_AMBULATORY_CARE_PROVIDER_SITE_OTHER): Payer: Medicaid Other | Admitting: Podiatry

## 2016-04-27 VITALS — BP 195/96 | HR 65 | Temp 96.4°F | Resp 14 | Ht >= 80 in | Wt 205.0 lb

## 2016-04-27 DIAGNOSIS — M79674 Pain in right toe(s): Secondary | ICD-10-CM

## 2016-04-27 DIAGNOSIS — B351 Tinea unguium: Secondary | ICD-10-CM | POA: Diagnosis not present

## 2016-04-27 DIAGNOSIS — M79675 Pain in left toe(s): Secondary | ICD-10-CM

## 2016-04-27 NOTE — Patient Instructions (Signed)
Diabetes and Foot Care Diabetes may cause you to have problems because of poor blood supply (circulation) to your feet and legs. This may cause the skin on your feet to become thinner, break easier, and heal more slowly. Your skin may become dry, and the skin may peel and crack. You may also have nerve damage in your legs and feet causing decreased feeling in them. You may not notice minor injuries to your feet that could lead to infections or more serious problems. Taking care of your feet is one of the most important things you can do for yourself.  HOME CARE INSTRUCTIONS  Wear shoes at all times, even in the house. Do not go barefoot. Bare feet are easily injured.  Check your feet daily for blisters, cuts, and redness. If you cannot see the bottom of your feet, use a mirror or ask someone for help.  Wash your feet with warm water (do not use hot water) and mild soap. Then pat your feet and the areas between your toes until they are completely dry. Do not soak your feet as this can dry your skin.  Apply a moisturizing lotion or petroleum jelly (that does not contain alcohol and is unscented) to the skin on your feet and to dry, brittle toenails. Do not apply lotion between your toes.  Trim your toenails straight across. Do not dig under them or around the cuticle. File the edges of your nails with an emery board or nail file.  Do not cut corns or calluses or try to remove them with medicine.  Wear clean socks or stockings every day. Make sure they are not too tight. Do not wear knee-high stockings since they may decrease blood flow to your legs.  Wear shoes that fit properly and have enough cushioning. To break in new shoes, wear them for just a few hours a day. This prevents you from injuring your feet. Always look in your shoes before you put them on to be sure there are no objects inside.  Do not cross your legs. This may decrease the blood flow to your feet.  If you find a minor scrape,  cut, or break in the skin on your feet, keep it and the skin around it clean and dry. These areas may be cleansed with mild soap and water. Do not cleanse the area with peroxide, alcohol, or iodine.  When you remove an adhesive bandage, be sure not to damage the skin around it.  If you have a wound, look at it several times a day to make sure it is healing.  Do not use heating pads or hot water bottles. They may burn your skin. If you have lost feeling in your feet or legs, you may not know it is happening until it is too late.  Make sure your health care provider performs a complete foot exam at least annually or more often if you have foot problems. Report any cuts, sores, or bruises to your health care provider immediately. SEEK MEDICAL CARE IF:   You have an injury that is not healing.  You have cuts or breaks in the skin.  You have an ingrown nail.  You notice redness on your legs or feet.  You feel burning or tingling in your legs or feet.  You have pain or cramps in your legs and feet.  Your legs or feet are numb.  Your feet always feel cold. SEEK IMMEDIATE MEDICAL CARE IF:   There is increasing redness,   swelling, or pain in or around a wound.  There is a red line that goes up your leg.  Pus is coming from a wound.  You develop a fever or as directed by your health care provider.  You notice a bad smell coming from an ulcer or wound.   This information is not intended to replace advice given to you by your health care provider. Make sure you discuss any questions you have with your health care provider.   Document Released: 08/13/2000 Document Revised: 04/18/2013 Document Reviewed: 01/23/2013 Elsevier Interactive Patient Education 2016 [employer]  

## 2016-04-27 NOTE — Progress Notes (Signed)
   Subjective:    Patient ID: Mark [name], male    DOB: [birth year], 65 y.o.   MRN: [id number]  N-none L- right foot x 5 toes D-1 month O-gradual C- worse A-right foot; 3rd toe has grown the other toe T-none  his patient presents today complaining of thickened toenails on the right foot that have been deformed for a considerable period of time but it gradually worsened over the last month which make shoe wearing walking uncomfortable. Patient is attempting trim the toenails, however, was unable to do so and request toenail debridement. Patient relates history of nail puncture on the right hallux treated in the emergency room with antibiotics which she is currently taking. Patient states that he has a follow-up visit with the treating doctor for the puncture in the right hallux in one week. He says the right great toe looks considerably better.  Patient is diabetic with a history of BK amputation left 2014   Review of Systems  All other systems reviewed and are negative.      Review of Systems  All other systems reviewed and are negative.      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 Toenails 1-5 are debrided mechanically and electrically without a bleeding  Reappoint 3 months

## 2016-05-04 ENCOUNTER — Ambulatory Visit: Payer: Medicaid Other | Attending: Family Medicine | Admitting: Family Medicine

## 2016-05-04 ENCOUNTER — Other Ambulatory Visit: Payer: Self-pay | Admitting: Internal Medicine

## 2016-05-04 ENCOUNTER — Encounter: Payer: Self-pay | Admitting: Family Medicine

## 2016-05-04 VITALS — BP 156/80 | HR 82 | Temp 98.5°F | Wt 204.0 lb

## 2016-05-04 DIAGNOSIS — Z87891 Personal history of nicotine dependence: Secondary | ICD-10-CM | POA: Insufficient documentation

## 2016-05-04 DIAGNOSIS — Z7982 Long term (current) use of aspirin: Secondary | ICD-10-CM | POA: Diagnosis not present

## 2016-05-04 DIAGNOSIS — E1122 Type 2 diabetes mellitus with diabetic chronic kidney disease: Secondary | ICD-10-CM | POA: Diagnosis not present

## 2016-05-04 DIAGNOSIS — I5042 Chronic combined systolic (congestive) and diastolic (congestive) heart failure: Secondary | ICD-10-CM | POA: Diagnosis not present

## 2016-05-04 DIAGNOSIS — I130 Hypertensive heart and chronic kidney disease with heart failure and stage 1 through stage 4 chronic kidney disease, or unspecified chronic kidney disease: Secondary | ICD-10-CM | POA: Insufficient documentation

## 2016-05-04 DIAGNOSIS — I10 Essential (primary) hypertension: Secondary | ICD-10-CM | POA: Diagnosis not present

## 2016-05-04 DIAGNOSIS — M7989 Other specified soft tissue disorders: Secondary | ICD-10-CM | POA: Diagnosis not present

## 2016-05-04 DIAGNOSIS — Z794 Long term (current) use of insulin: Secondary | ICD-10-CM | POA: Insufficient documentation

## 2016-05-04 DIAGNOSIS — X58XXXD Exposure to other specified factors, subsequent encounter: Secondary | ICD-10-CM | POA: Diagnosis not present

## 2016-05-04 DIAGNOSIS — S91131D Puncture wound without foreign body of right great toe without damage to nail, subsequent encounter: Secondary | ICD-10-CM | POA: Diagnosis not present

## 2016-05-04 DIAGNOSIS — N189 Chronic kidney disease, unspecified: Secondary | ICD-10-CM | POA: Diagnosis not present

## 2016-05-04 DIAGNOSIS — Z96652 Presence of left artificial knee joint: Secondary | ICD-10-CM | POA: Insufficient documentation

## 2016-05-04 DIAGNOSIS — R569 Unspecified convulsions: Secondary | ICD-10-CM | POA: Insufficient documentation

## 2016-05-04 MED ORDER — CARVEDILOL 12.5 MG PO TABS: 12.5000 mg | ORAL_TABLET | Freq: Two times a day (BID) | ORAL | 3 refills | Status: DC

## 2016-05-04 MED FILL — CARVEDILOL 12.5 MG TABLET: 12.5 | 30 days supply | Qty: 60 | Fill #0

## 2016-05-04 NOTE — Patient Instructions (Addendum)
Mark [name] was seen today for foot swelling.  Diagnoses and all orders for this visit:  Essential hypertension -     carvedilol (COREG) 12.5 MG tablet; Take 1 tablet (12.5 mg total) by mouth 2 (two) times daily with a meal.  Puncture wound of great toe of right foot, subsequent encounter  Chronic combined systolic and diastolic congestive heart failure (HCC) -     carvedilol (COREG) 12.5 MG tablet; Take 1 tablet (12.5 mg total) by mouth 2 (two) times daily with a meal.   Continue once daily lasix   F/u in  6 weeks for HTN and CKD, repeat labs  Dr. Adrian Blackwater

## 2016-05-04 NOTE — Progress Notes (Signed)
Subjective:  Patient ID: Mark [name], male    DOB: October 20, 1950  Age: 65 y.o. MRN: [id number]  CC: Foot Swelling   HPI JARI DIPASQUALE has HTN, Diabetes, CKD,  s/p L BKA, new onset seizure in setting of hypoglycemia he presents for    1. R foot pain: started on 04/14/16 when he was building a privacy fence and a piece fell on his R great toe. He reports a nail went through his toe. He went to the ED on day of injury. He received an updated tetanus shot. DG foot was negative for foreign body and fracture. There was arthritic changes and possible epidermoid inclusion cyst of hte tuft of the distal phalanx of the second toe. He had swelling and redness of R great toe with serous drainage from the wound.  He has completed course of doxycycline and clindamycin. He denies foot pain, redness, swelling or drainange from wound. Leg swelling has improved with addition of lasix.   2. CKD: he is taking lasix. Leg swelling has improved. No CP or SOB. He does have fatigue.   Social History  Substance Use Topics  . Smoking status: Former Smoker    Quit date: 08/03/2013  . Smokeless tobacco: Never Used  . Alcohol use No     Comment: has quit all alcohol   Outpatient Medications Prior to Visit  Medication Sig Dispense Refill  . ACCU-CHEK AVIVA PLUS test strip USE AS INSTRUCTED 100 each 12  . ACCU-CHEK SOFTCLIX LANCETS lancets USE AS INSTRUCTED 100 each 12  . amLODipine (NORVASC) 5 MG tablet Take 1 tablet (5 mg total) by mouth daily. 30 tablet 3  . aspirin 81 MG EC tablet Take 1 tablet (81 mg total) by mouth daily. 90 tablet 3  . atorvastatin (LIPITOR) 20 MG tablet Take 1 tablet (20 mg total) by mouth daily. 90 tablet 3  . B-D UF III MINI PEN NEEDLES 31G X 5 MM MISC USE TO INJECT INSULIN AS DIRECTED BY PHYSICIAN 100 each 12  . Blood Glucose Monitoring Suppl (ACCU-CHEK AVIVA PLUS) W/DEVICE KIT Use as prescribed TID before meals and QHS 1 kit 0  . carvedilol (COREG) 6.25 MG tablet Take 1 tablet (6.25  mg total) by mouth 2 (two) times daily with a meal. 180 tablet 3  . clindamycin (CLEOCIN) 300 MG capsule Take 1 capsule (300 mg total) by mouth 4 (four) times daily. 40 capsule 0  . divalproex (DEPAKOTE ER) 500 MG 24 hr tablet Take one tablet twice a day for 3 weeks and then take one tablet daily for 3 weeks, then stop 60 tablet 1  . doxycycline (VIBRA-TABS) 100 MG tablet Take 1 tablet (100 mg total) by mouth 2 (two) times daily. 20 tablet 0  . ferrous gluconate (FERGON) 324 MG tablet Take 1 tablet (324 mg total) by mouth 2 (two) times daily with a meal. 60 tablet 2  . furosemide (LASIX) 40 MG tablet Take 1 tablet (40 mg total) by mouth daily. 30 tablet 0  . glucose blood (ACCU-CHEK AVIVA) test strip Use as instructed 100 each 12  . Insulin Glargine (LANTUS SOLOSTAR) 100 UNIT/ML Solostar Pen Inject 15 Units into the skin daily at 10 pm. 10 pen 3  . Insulin Syringe-Needle U-100 (INSULIN SYRINGE .5CC/30GX5/16") 30G X 5/16" 0.5 ML MISC Check blood sugar TID & QHS 100 each 2  . lacosamide (VIMPAT) 200 MG TABS tablet Take 1 tablet (200 mg total) by mouth 2 (two) times daily. 60 tablet 2  No facility-administered medications prior to visit.     ROS Review of Systems  Constitutional: Positive for fatigue. Negative for chills, fever and unexpected weight change.  Eyes: Negative for visual disturbance.  Respiratory: Negative for cough and shortness of breath.   Cardiovascular: Negative for chest pain, palpitations and leg swelling.  Gastrointestinal: Negative for abdominal pain, blood in stool, constipation, diarrhea, nausea and vomiting.  Endocrine: Negative for polydipsia, polyphagia and polyuria.  Musculoskeletal: Negative for arthralgias, back pain, gait problem, myalgias and neck pain.  Skin: Negative for rash and wound.  Allergic/Immunologic: Negative for immunocompromised state.  Neurological: Positive for seizures (12/2015).  Hematological: Negative for adenopathy. Does not bruise/bleed  easily.  Psychiatric/Behavioral: Negative for dysphoric mood, sleep disturbance and suicidal ideas. The patient is not nervous/anxious.     Objective:  BP (!) 156/80 (BP Location: Left Arm, Patient Position: Sitting, Cuff Size: Small)   Pulse 82   Temp 98.5 F (36.9 C) (Oral)   Wt 204 lb (92.5 kg)   BMI 19.21 kg/m   BP Readings from Last 3 Encounters:  05/04/16 (!) 156/80  04/27/16 (!) 195/96  04/20/16 (!) 164/88    Physical Exam  Constitutional: He appears well-developed and well-nourished. No distress.  HENT:  Head: Normocephalic and atraumatic.  Neck: Normal range of motion. Neck supple.  Cardiovascular: Normal rate, regular rhythm, normal heart sounds and intact distal pulses.   Pulses:      Dorsalis pedis pulses are 2+ on the right side.  Pulmonary/Chest: Effort normal and breath sounds normal.  Musculoskeletal: He exhibits no edema.       Legs:      Feet:  L BKA  Thickened and yellow nails R foot   Neurological: He is alert.  Skin: Skin is warm and dry. No rash noted. No erythema.  Psychiatric: He has a normal mood and affect.   Lab Results  Component Value Date   HGBA1C 7.8 03/08/2016   CBG 131   Chemistry      Component Value Date/Time   NA 145 04/26/2016 0915   NA 141 01/14/2016   K 4.3 04/26/2016 0915   CL 109 04/26/2016 0915   CO2 24 04/26/2016 0915   BUN 29 (H) 04/26/2016 0915   BUN 23 (A) 01/14/2016   CREATININE 1.88 (H) 04/26/2016 0915   GLU 175 01/14/2016      Component Value Date/Time   CALCIUM 8.8 04/26/2016 0915   ALKPHOS 90 01/14/2016   AST 17 01/14/2016   ALT 14 01/14/2016   BILITOT 0.7 01/07/2016 0803      Assessment & Plan:  Carrson was seen today for foot swelling.  Diagnoses and all orders for this visit:  Essential hypertension -     carvedilol (COREG) 12.5 MG tablet; Take 1 tablet (12.5 mg total) by mouth 2 (two) times daily with a meal.  Puncture wound of great toe of right foot, subsequent encounter  Chronic  combined systolic and diastolic congestive heart failure (HCC) -     carvedilol (COREG) 12.5 MG tablet; Take 1 tablet (12.5 mg total) by mouth 2 (two) times daily with a meal.   There are no diagnoses linked to this encounter.  No orders of the defined types were placed in this encounter.   Follow-up: Return in about 6 weeks (around 06/15/2016) for HTN, CKD .   Boykin Nearing MD

## 2016-05-05 MED FILL — ACCU-CHEK SOFTCLIX LANCETS: 30 days supply | Qty: 100 | Fill #0

## 2016-05-07 ENCOUNTER — Encounter: Payer: Self-pay | Admitting: Family Medicine

## 2016-05-07 ENCOUNTER — Ambulatory Visit: Payer: Medicaid Other | Admitting: Family Medicine

## 2016-05-07 MED ORDER — FUROSEMIDE 20 MG PO TABS: 20.0000 mg | ORAL_TABLET | Freq: Every day | ORAL | 2 refills | Status: DC

## 2016-05-07 NOTE — Assessment & Plan Note (Addendum)
A: HTN and CKD, elevated BP P: Add coreg  Continue lasix but decreased to 20 mg daily for 40 mg daily

## 2016-05-07 NOTE — Assessment & Plan Note (Signed)
Healed puncture wound of R great toe in diabetic

## 2016-05-10 ENCOUNTER — Emergency Department (HOSPITAL_COMMUNITY): Payer: Medicaid Other

## 2016-05-10 ENCOUNTER — Emergency Department (HOSPITAL_COMMUNITY)
Admission: EM | Admit: 2016-05-10 | Discharge: 2016-05-10 | Disposition: A | Discharge status: Home / Self Care | Payer: Medicaid Other | Attending: Emergency Medicine | Admitting: Emergency Medicine

## 2016-05-10 DIAGNOSIS — I509 Heart failure, unspecified: Secondary | ICD-10-CM | POA: Diagnosis not present

## 2016-05-10 DIAGNOSIS — N183 Chronic kidney disease, stage 3 (moderate): Secondary | ICD-10-CM | POA: Insufficient documentation

## 2016-05-10 DIAGNOSIS — Z87891 Personal history of nicotine dependence: Secondary | ICD-10-CM | POA: Diagnosis not present

## 2016-05-10 DIAGNOSIS — Z79899 Other long term (current) drug therapy: Secondary | ICD-10-CM | POA: Insufficient documentation

## 2016-05-10 DIAGNOSIS — E119 Type 2 diabetes mellitus without complications: Secondary | ICD-10-CM | POA: Diagnosis not present

## 2016-05-10 DIAGNOSIS — M25511 Pain in right shoulder: Secondary | ICD-10-CM

## 2016-05-10 DIAGNOSIS — Y999 Unspecified external cause status: Secondary | ICD-10-CM | POA: Insufficient documentation

## 2016-05-10 DIAGNOSIS — Y929 Unspecified place or not applicable: Secondary | ICD-10-CM | POA: Diagnosis not present

## 2016-05-10 DIAGNOSIS — W228XXA Striking against or struck by other objects, initial encounter: Secondary | ICD-10-CM | POA: Diagnosis not present

## 2016-05-10 DIAGNOSIS — Z794 Long term (current) use of insulin: Secondary | ICD-10-CM | POA: Insufficient documentation

## 2016-05-10 DIAGNOSIS — I130 Hypertensive heart and chronic kidney disease with heart failure and stage 1 through stage 4 chronic kidney disease, or unspecified chronic kidney disease: Secondary | ICD-10-CM | POA: Insufficient documentation

## 2016-05-10 DIAGNOSIS — Z7982 Long term (current) use of aspirin: Secondary | ICD-10-CM | POA: Insufficient documentation

## 2016-05-10 DIAGNOSIS — Y9389 Activity, other specified: Secondary | ICD-10-CM | POA: Diagnosis not present

## 2016-05-10 MED ORDER — ACETAMINOPHEN 325 MG PO TABS
650.0000 mg | ORAL_TABLET | Freq: Once | ORAL | Status: AC
  Administered 2016-05-10: 650 mg via ORAL
  Filled 2016-05-10: qty 2

## 2016-05-10 MED ORDER — ACETAMINOPHEN 500 MG PO TABS: 500.0000 mg | ORAL_TABLET | Freq: Four times a day (QID) | ORAL | 0 refills | Status: DC | PRN

## 2016-05-10 MED FILL — FUROSEMIDE 20 MG TABLET: 20 | 30 days supply | Qty: 30 | Fill #0

## 2016-05-10 NOTE — ED Triage Notes (Signed)
Pt reports falling out of a vehicle when his prosthetic leg became undone. Pt reports landing on right shoulder and having pain 9/10. Pt has limited movement with affected extremity.
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
## 2016-05-28 MED FILL — ACCU-CHEK AVIVA PLUS TEST S: 25 days supply | Qty: 100 | Fill #1

## 2016-06-03 ENCOUNTER — Encounter (HOSPITAL_COMMUNITY): Payer: Self-pay

## 2016-06-03 ENCOUNTER — Ambulatory Visit (INDEPENDENT_AMBULATORY_CARE_PROVIDER_SITE_OTHER): Payer: Medicaid Other | Admitting: Orthopedic Surgery

## 2016-06-03 ENCOUNTER — Emergency Department (HOSPITAL_COMMUNITY)
Admission: EM | Admit: 2016-06-03 | Discharge: 2016-06-03 | Disposition: A | Discharge status: Home / Self Care | Payer: Medicaid Other | Attending: Emergency Medicine | Admitting: Emergency Medicine

## 2016-06-03 DIAGNOSIS — N183 Chronic kidney disease, stage 3 (moderate): Secondary | ICD-10-CM | POA: Diagnosis not present

## 2016-06-03 DIAGNOSIS — Z794 Long term (current) use of insulin: Secondary | ICD-10-CM | POA: Diagnosis not present

## 2016-06-03 DIAGNOSIS — I130 Hypertensive heart and chronic kidney disease with heart failure and stage 1 through stage 4 chronic kidney disease, or unspecified chronic kidney disease: Secondary | ICD-10-CM | POA: Insufficient documentation

## 2016-06-03 DIAGNOSIS — R55 Syncope and collapse: Secondary | ICD-10-CM | POA: Diagnosis not present

## 2016-06-03 DIAGNOSIS — Z89512 Acquired absence of left leg below knee: Secondary | ICD-10-CM | POA: Insufficient documentation

## 2016-06-03 DIAGNOSIS — Z87891 Personal history of nicotine dependence: Secondary | ICD-10-CM | POA: Insufficient documentation

## 2016-06-03 DIAGNOSIS — Z7982 Long term (current) use of aspirin: Secondary | ICD-10-CM | POA: Insufficient documentation

## 2016-06-03 DIAGNOSIS — E1122 Type 2 diabetes mellitus with diabetic chronic kidney disease: Secondary | ICD-10-CM | POA: Insufficient documentation

## 2016-06-03 DIAGNOSIS — I5042 Chronic combined systolic (congestive) and diastolic (congestive) heart failure: Secondary | ICD-10-CM | POA: Diagnosis not present

## 2016-06-03 LAB — I-STAT CHEM 8, ED
BUN: 43 mg/dL — ABNORMAL HIGH (ref 6–20)
Calcium, Ion: 1.19 mmol/L (ref 1.15–1.40)
Chloride: 115 mmol/L — ABNORMAL HIGH (ref 101–111)
Creatinine, Ser: 1.9 mg/dL — ABNORMAL HIGH (ref 0.61–1.24)
Glucose, Bld: 142 mg/dL — ABNORMAL HIGH (ref 65–99)
HCT: 27 % — ABNORMAL LOW (ref 39.0–52.0)
Hemoglobin: 9.2 g/dL — ABNORMAL LOW (ref 13.0–17.0)
Potassium: 4.3 mmol/L (ref 3.5–5.1)
Sodium: 144 mmol/L (ref 135–145)
TCO2: 19 mmol/L (ref 0–100)

## 2016-06-03 LAB — CBC WITH DIFFERENTIAL/PLATELET
Basophils Absolute: 0 10*3/uL (ref 0.0–0.1)
Basophils Relative: 1 %
Eosinophils Absolute: 0.4 10*3/uL (ref 0.0–0.7)
Eosinophils Relative: 6 %
HCT: 27.2 % — ABNORMAL LOW (ref 39.0–52.0)
Hemoglobin: 8.6 g/dL — ABNORMAL LOW (ref 13.0–17.0)
Lymphocytes Relative: 31 %
Lymphs Abs: 2 10*3/uL (ref 0.7–4.0)
MCH: 28.8 pg (ref 26.0–34.0)
MCHC: 31.6 g/dL (ref 30.0–36.0)
MCV: 91 fL (ref 78.0–100.0)
Monocytes Absolute: 0.4 10*3/uL (ref 0.1–1.0)
Monocytes Relative: 7 %
Neutro Abs: 3.5 10*3/uL (ref 1.7–7.7)
Neutrophils Relative %: 55 %
Platelets: 340 10*3/uL (ref 150–400)
RBC: 2.99 MIL/uL — ABNORMAL LOW (ref 4.22–5.81)
RDW: 14.6 % (ref 11.5–15.5)
WBC: 6.3 10*3/uL (ref 4.0–10.5)

## 2016-06-03 LAB — I-STAT TROPONIN, ED
Troponin i, poc: 0.01 ng/mL (ref 0.00–0.08)
Troponin i, poc: 0.02 ng/mL (ref 0.00–0.08)

## 2016-06-03 LAB — VALPROIC ACID LEVEL: Valproic Acid Lvl: <10 ug/mL — ABNORMAL LOW (ref 50.0–100.0)

## 2016-06-03 LAB — CBG MONITORING, ED: Glucose-Capillary: 146 mg/dL — ABNORMAL HIGH (ref 65–99)

## 2016-06-03 NOTE — Discharge Instructions (Signed)
Call the [facility] tomorrow to schedule the next available office visit as follow-up to your visit here today. Return if concern for any reason
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
## 2016-06-23 ENCOUNTER — Encounter (HOSPITAL_COMMUNITY): Payer: Self-pay

## 2016-06-23 ENCOUNTER — Emergency Department (HOSPITAL_COMMUNITY): Payer: Medicaid Other

## 2016-06-23 ENCOUNTER — Emergency Department (HOSPITAL_COMMUNITY)
Admission: EM | Admit: 2016-06-23 | Discharge: 2016-06-23 | Disposition: A | Discharge status: Home / Self Care | Payer: Medicaid Other | Attending: Emergency Medicine | Admitting: Emergency Medicine

## 2016-06-23 DIAGNOSIS — K460 Unspecified abdominal hernia with obstruction, without gangrene: Secondary | ICD-10-CM | POA: Diagnosis not present

## 2016-06-23 DIAGNOSIS — N183 Chronic kidney disease, stage 3 (moderate): Secondary | ICD-10-CM | POA: Insufficient documentation

## 2016-06-23 DIAGNOSIS — I130 Hypertensive heart and chronic kidney disease with heart failure and stage 1 through stage 4 chronic kidney disease, or unspecified chronic kidney disease: Secondary | ICD-10-CM | POA: Diagnosis not present

## 2016-06-23 DIAGNOSIS — K56699 Other intestinal obstruction unspecified as to partial versus complete obstruction: Secondary | ICD-10-CM

## 2016-06-23 DIAGNOSIS — Z87891 Personal history of nicotine dependence: Secondary | ICD-10-CM | POA: Insufficient documentation

## 2016-06-23 DIAGNOSIS — Z7982 Long term (current) use of aspirin: Secondary | ICD-10-CM | POA: Diagnosis not present

## 2016-06-23 DIAGNOSIS — I509 Heart failure, unspecified: Secondary | ICD-10-CM | POA: Diagnosis not present

## 2016-06-23 DIAGNOSIS — Z794 Long term (current) use of insulin: Secondary | ICD-10-CM | POA: Insufficient documentation

## 2016-06-23 DIAGNOSIS — R109 Unspecified abdominal pain: Secondary | ICD-10-CM | POA: Diagnosis present

## 2016-06-23 DIAGNOSIS — E1122 Type 2 diabetes mellitus with diabetic chronic kidney disease: Secondary | ICD-10-CM | POA: Diagnosis not present

## 2016-06-23 DIAGNOSIS — Z79899 Other long term (current) drug therapy: Secondary | ICD-10-CM | POA: Diagnosis not present

## 2016-06-23 LAB — CBC
HCT: 29.1 % — ABNORMAL LOW (ref 39.0–52.0)
Hemoglobin: 9.6 g/dL — ABNORMAL LOW (ref 13.0–17.0)
MCH: 28.9 pg (ref 26.0–34.0)
MCHC: 33 g/dL (ref 30.0–36.0)
MCV: 87.7 fL (ref 78.0–100.0)
Platelets: 371 10*3/uL (ref 150–400)
RBC: 3.32 MIL/uL — ABNORMAL LOW (ref 4.22–5.81)
RDW: 14.4 % (ref 11.5–15.5)
WBC: 8.2 10*3/uL (ref 4.0–10.5)

## 2016-06-23 LAB — COMPREHENSIVE METABOLIC PANEL
ALT: 28 U/L (ref 17–63)
AST: 20 U/L (ref 15–41)
Albumin: 3.8 g/dL (ref 3.5–5.0)
Alkaline Phosphatase: 73 U/L (ref 38–126)
Anion gap: 6 (ref 5–15)
BUN: 36 mg/dL — ABNORMAL HIGH (ref 6–20)
CO2: 18 mmol/L — ABNORMAL LOW (ref 22–32)
Calcium: 9.4 mg/dL (ref 8.9–10.3)
Chloride: 116 mmol/L — ABNORMAL HIGH (ref 101–111)
Creatinine, Ser: 1.59 mg/dL — ABNORMAL HIGH (ref 0.61–1.24)
GFR calc Af Amer: 51 mL/min — ABNORMAL LOW (ref >60)
GFR calc non Af Amer: 44 mL/min — ABNORMAL LOW (ref >60)
Glucose, Bld: 179 mg/dL — ABNORMAL HIGH (ref 65–99)
Potassium: 4.4 mmol/L (ref 3.5–5.1)
Sodium: 140 mmol/L (ref 135–145)
Total Bilirubin: 0.5 mg/dL (ref 0.3–1.2)
Total Protein: 8 g/dL (ref 6.5–8.1)

## 2016-06-23 LAB — URINALYSIS, ROUTINE W REFLEX MICROSCOPIC
Bilirubin Urine: NEGATIVE
Glucose, UA: NEGATIVE mg/dL
Ketones, ur: NEGATIVE mg/dL
Leukocytes, UA: NEGATIVE
Nitrite: NEGATIVE
Protein, ur: >300 mg/dL — AB
Specific Gravity, Urine: 1.025 (ref 1.005–1.030)
pH: 5.5 (ref 5.0–8.0)

## 2016-06-23 LAB — URINE MICROSCOPIC-ADD ON
Squamous Epithelial / LPF: NONE SEEN
WBC, UA: NONE SEEN WBC/hpf (ref 0–5)

## 2016-06-23 LAB — LIPASE, BLOOD: Lipase: 15 U/L (ref 11–51)

## 2016-06-23 LAB — CBG MONITORING, ED: Glucose-Capillary: 150 mg/dL — ABNORMAL HIGH (ref 65–99)

## 2016-06-23 MED ORDER — ONDANSETRON HCL 4 MG/2ML IJ SOLN
4.0000 mg | Freq: Once | INTRAMUSCULAR | Status: AC
  Administered 2016-06-23: 4 mg via INTRAVENOUS
  Filled 2016-06-23: qty 2

## 2016-06-23 MED ORDER — SODIUM CHLORIDE 0.9 % IV BOLUS (SEPSIS)
1000.0000 mL | Freq: Once | INTRAVENOUS | Status: AC
  Administered 2016-06-23: 1000 mL via INTRAVENOUS

## 2016-06-23 MED ORDER — IOPAMIDOL (ISOVUE-300) INJECTION 61%
15.0000 mL | Freq: Once | INTRAVENOUS | Status: DC | PRN
Start: 2016-06-23 — End: 2016-06-23

## 2016-06-23 MED ORDER — HYDROMORPHONE HCL 1 MG/ML IJ SOLN
1.0000 mg | Freq: Once | INTRAMUSCULAR | Status: AC
  Administered 2016-06-23: 1 mg via INTRAVENOUS
  Filled 2016-06-23: qty 1

## 2016-06-23 MED ORDER — ONDANSETRON 4 MG PO TBDP
4.0000 mg | ORAL_TABLET | Freq: Once | ORAL | Status: AC | PRN
Start: 2016-06-23 — End: 2016-06-23
  Administered 2016-06-23: 4 mg via ORAL
  Filled 2016-06-23: qty 1

## 2016-06-23 MED ORDER — IOPAMIDOL (ISOVUE-300) INJECTION 61%
100.0000 mL | Freq: Once | INTRAVENOUS | Status: AC | PRN
  Administered 2016-06-23: 100 mL via INTRAVENOUS

## 2016-06-23 NOTE — ED Notes (Signed)
Patient had a BM in his pants, was not able to get to bathroom in time. Provided him with wash cloths and a towel. Patient refused my assistance in cleaning up.

## 2016-06-23 NOTE — Progress Notes (Signed)
Patient noted to have been seen 5 times in the ED.  Patient listed as having Medicaid insurance.  Pcp listed on patient's Medicaid card is located at the [facility]

## 2016-06-23 NOTE — ED Triage Notes (Signed)
PT C/O LOWER ABDOMINAL PAN WITH N/V SINCE 4 AM. PT STS THE PAIN COMES AND GOES. DENIES FEVER OR DIARRHEA.
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
## 2016-06-23 NOTE — ED Notes (Signed)
Patient transported to CT 

## 2016-06-30 ENCOUNTER — Other Ambulatory Visit: Payer: Self-pay | Admitting: Family Medicine

## 2016-06-30 DIAGNOSIS — R569 Unspecified convulsions: Secondary | ICD-10-CM

## 2016-07-01 ENCOUNTER — Other Ambulatory Visit: Payer: Self-pay | Admitting: Family Medicine

## 2016-07-01 DIAGNOSIS — R569 Unspecified convulsions: Secondary | ICD-10-CM

## 2016-07-01 MED ORDER — LACOSAMIDE 200 MG PO TABS: 200.0000 mg | ORAL_TABLET | Freq: Two times a day (BID) | ORAL | 2 refills | Status: DC

## 2016-07-01 NOTE — Telephone Encounter (Signed)
Phone in vimpat refill

## 2016-07-02 ENCOUNTER — Telehealth: Payer: Self-pay

## 2016-07-02 ENCOUNTER — Ambulatory Visit: Payer: Self-pay | Admitting: General Surgery

## 2016-07-02 NOTE — Telephone Encounter (Signed)
Pt was called on 11/03 and no answer, no Vm pick up, pt script will be in log book up front.

## 2016-07-06 ENCOUNTER — Ambulatory Visit: Payer: Self-pay | Admitting: General Surgery

## 2016-07-08 ENCOUNTER — Other Ambulatory Visit: Payer: Self-pay | Admitting: Family Medicine

## 2016-07-08 DIAGNOSIS — I10 Essential (primary) hypertension: Secondary | ICD-10-CM

## 2016-07-13 ENCOUNTER — Encounter (HOSPITAL_COMMUNITY)
Admission: RE | Admit: 2016-07-13 | Discharge: 2016-07-13 | Disposition: A | Discharge status: Home / Self Care | Payer: Medicaid Other | Source: Ambulatory Visit | Attending: General Surgery | Admitting: General Surgery

## 2016-07-13 ENCOUNTER — Encounter (HOSPITAL_COMMUNITY): Payer: Self-pay

## 2016-07-13 DIAGNOSIS — Z01818 Encounter for other preprocedural examination: Secondary | ICD-10-CM | POA: Diagnosis present

## 2016-07-13 DIAGNOSIS — K432 Incisional hernia without obstruction or gangrene: Secondary | ICD-10-CM | POA: Insufficient documentation

## 2016-07-13 HISTORY — DX: Unspecified convulsions: R56.9

## 2016-07-13 HISTORY — DX: Functional dyspepsia: K30

## 2016-07-13 LAB — BASIC METABOLIC PANEL
Anion gap: 6 (ref 5–15)
BUN: 34 mg/dL — ABNORMAL HIGH (ref 6–20)
CO2: 18 mmol/L — ABNORMAL LOW (ref 22–32)
Calcium: 9.1 mg/dL (ref 8.9–10.3)
Chloride: 116 mmol/L — ABNORMAL HIGH (ref 101–111)
Creatinine, Ser: 2.06 mg/dL — ABNORMAL HIGH (ref 0.61–1.24)
GFR calc Af Amer: 38 mL/min — ABNORMAL LOW (ref >60)
GFR calc non Af Amer: 32 mL/min — ABNORMAL LOW (ref >60)
Glucose, Bld: 163 mg/dL — ABNORMAL HIGH (ref 65–99)
Potassium: 4.1 mmol/L (ref 3.5–5.1)
Sodium: 140 mmol/L (ref 135–145)

## 2016-07-13 LAB — CBC WITH DIFFERENTIAL/PLATELET
Basophils Absolute: 0 10*3/uL (ref 0.0–0.1)
Basophils Relative: 1 %
Eosinophils Absolute: 0.5 10*3/uL (ref 0.0–0.7)
Eosinophils Relative: 8 %
HCT: 28.3 % — ABNORMAL LOW (ref 39.0–52.0)
Hemoglobin: 9.2 g/dL — ABNORMAL LOW (ref 13.0–17.0)
Lymphocytes Relative: 36 %
Lymphs Abs: 2.5 10*3/uL (ref 0.7–4.0)
MCH: 29 pg (ref 26.0–34.0)
MCHC: 32.5 g/dL (ref 30.0–36.0)
MCV: 89.3 fL (ref 78.0–100.0)
Monocytes Absolute: 0.4 10*3/uL (ref 0.1–1.0)
Monocytes Relative: 6 %
Neutro Abs: 3.4 10*3/uL (ref 1.7–7.7)
Neutrophils Relative %: 49 %
Platelets: 370 10*3/uL (ref 150–400)
RBC: 3.17 MIL/uL — ABNORMAL LOW (ref 4.22–5.81)
RDW: 13.9 % (ref 11.5–15.5)
WBC: 6.8 10*3/uL (ref 4.0–10.5)

## 2016-07-13 LAB — GLUCOSE, CAPILLARY: Glucose-Capillary: 128 mg/dL — ABNORMAL HIGH (ref 65–99)

## 2016-07-13 NOTE — Progress Notes (Signed)
Anesthesia Chart Review: Patient is a 65 year old male scheduled for laparoscopic incisional hernia repair on 07/14/16 by Dr. Kieth Brightly. PAT was earlier today. Called after 4 PM to review chart.  History includes former smoker (quit 08/03/13), HTN, CKD (hospitalized for acute on chronic KD with Cr 4.43 03/2015), DM2, CHF, C. Difficile colitis 01/01/16, anemia, seizures, cocaine use (+UDS 01/01/16), elevated troponin (0.09 01/02/16, 0.04 10/16/14), left BKA 11/21/13, splenectomy, colon surgery '89. - 06/26/16 ED: Incarcerated abdominal hernia s/p reduction in ED.  - 06/03/16 ED: LOC/near sycnope ? Due to hypoglycemia.  - 05/10/16 ED: Right shoulder pain after fall. - 04/14/16 ED: Toe injury. - 01/01/16-01/08/16 admission for acute encephalapathy possibly multifactorial: Hypoglycemia related to poor oral intake/oral hypoglycemics and worsening renal functions, cocaine abuse. He developed seizure like activity on 01/05/16 and was started on medication. LP unremarkable. Had elevated troponin (peak 0.09) felt due to demand ischemia related to hypoglycemia, dehydration, AKI, and cocaine abuse. EF and wall motion abnormality improved. Neurology and ortho consulted that admission.  - 04/16/15-04/22/15 admission for acute on chronic kidney disease (baseline Cr 1.6 with increase to 4.43), likely from ACEI and diuretic therapy.    - 10/16/14 - 10/18/14 admission for SOB and acute combined systolic and diastolic CHF. CTA negative for PE. Mildly elevated troponin felt likely due to demand ischemia. Out-patient cardiology referral recommended. (Saw Dr. Jenell Milliner. Although his note felt patient likely had underlying ischemic heart disease considering his DM and PVD history, he did not order a stress test and was treated medically. Last visit seen was on 12/25/14.)  Meds include amlodipine, ASA 81 mg, Lipitor, Coreg, Depakote, Fergon, Lasix, Lantus, lacosamide.   PCP is with [facility], last visit  with Dr. Adrian Blackwater 05/04/16..  BP (!) 159/88   Pulse 62   Temp 36.9 C   Resp 18   Ht 6\' 2"  (1.88 m)   Wt 185 lb 14.4 oz (84.3 kg)   SpO2 100%   BMI 23.87 kg/m    06/03/16 EKG: SR, minimal ST elevation anterior leads.  01/03/16 Echo: Study Conclusions - Left ventricle: The cavity size was normal. Wall thickness was   increased in a pattern of moderate LVH. Systolic function was   normal. The estimated ejection fraction was in the range of 60%   to 65%. Wall motion was normal; there were no regional wall   motion abnormalities. Doppler parameters are consistent with   abnormal left ventricular relaxation (grade 1 diastolic   dysfunction). - Aortic valve: Nodular calcification of the non coronary cusp. - Left atrium: The atrium was mildly dilated. - Atrial septum: No defect or patent foramen ovale was identified. (Comparison echo 10/17/14: LVEF 45-50% mild diffuse hypokinesis, grade 2 diastolic dysfunction, aortic root 38 mm, mildly dilated ascending aorta.)  01/08/16 1V CXR: FINDINGS: Cardiac shadow is at the upper limits of normal in size. The lungs are well aerated bilaterally. Minimal right basilar atelectasis is seen. No sizable effusion is noted. No bony abnormality is seen. IMPRESSION: Minimal right basilar atelectasis.  06/23/16 CT abd/pelvis: IMPRESSION: Small bowel obstruction due to left lower quadrant ventral hernia containing a small bowel loop. Midline supraumbilical ventral hernia containing fat. Bilateral renal cysts.  Left lower pole nephrolithiasis. Aortoiliac atherosclerosis.  01/06/16 EEG:  Clinical interpretation: This 24 hours of intensive EEG monitoring with simultaneous monitoring is abnormal due to reactive background activity slowing consistent with a moderate encephalopathy.  In addition presence of generalized periodic epileptiform discharges GPEDs suggestive of  cortical irritability, particularly involving frontal cortex.   Preoperative labs noted. BUN  34, Cr 2.06 (up from 1.59 on 06/23/16 and 1.90 on 06/03/16; range of 1.44-2.44 since 03/2015). Glucose 163. H/H 9.2/28.3 (HGB range 8.2-9.6 since 02/2016).   Reviewed above with anesthesiologist Dr. Ermalene Postin. Patient with multiple co-morbidities as outlined above,, but with recent incarcerated hernia reduced in ED that now needs repair. He has not had a stress test that I see, but follow-up echo this year showed normalization of his EF since 2016. His H/H appear stable--defer decision for T&S to surgeon and/or anesthesiologist. His Cr has been variable--baseline was 1.6 in 03/2015, but more recently has been primarily around the 1.9 range. He reported last cocaine was several years ago, but + UDS earlier this year. He will need a UDS on arrival. Definitive anesthesia plan following review of UDS and evaluation of patient.  George Hugh [facility] Short Stay Center/Anesthesiology Phone [phone] 07/13/2016 5:40 PM

## 2016-07-13 NOTE — Pre-Procedure Instructions (Signed)
Mark [name]  07/13/2016      [facility] [place] ([place]), [place] [address] [place] ([place] [address] Phone: [phone] Fax: [place], [place] [address] [place] [address] Phone: [phone] Fax: [phone]    Your procedure is scheduled on    Wednesday  07/14/16  Report to [facility] Admitting at 1230 P.M.  Call this number if you have problems the morning of surgery:  [phone]   Remember:  Do not eat food or drink liquids after midnight.  Take these medicines the morning of surgery with A SIP OF WATER  AMLODIPINE (NORVASC), CARVEDILOL (COREG)    (STOP ASPIRIN OR ASPIRIN PRODUCTS, IBUPROFEN/ ADVIL/ MOTRIN/ ALEVE, GOODY POWDERS/ BC'S, HERBAL MEDICINES)   DRINK 8 OZ BOTTLE WATER AT 1030 AM !!!!      How to Manage Your Diabetes Before and After Surgery  Why is it important to control my blood sugar before and after surgery? . Improving blood sugar levels before and after surgery helps healing and can limit problems. . A way of improving blood sugar control is eating a healthy diet by: o  Eating less sugar and carbohydrates o  Increasing activity/exercise o  Talking with your doctor about reaching your blood sugar goals . High blood sugars (greater than 180 mg/dL) can raise your risk of infections and slow your recovery, so you will need to focus on controlling your diabetes during the weeks before surgery. . Make sure that the doctor who takes care of your diabetes knows about your planned surgery including the date and location.  How do I manage my blood sugar before surgery? . Check your blood sugar at least 4 times a day, starting 2 days before surgery, to make sure that the level is not too high or low. o Check your blood sugar the morning of your surgery when you wake up and every 2 hours until you get to the Short Stay  unit. . If your blood sugar is less than 70 mg/dL, you will need to treat for low blood sugar: o Do not take insulin. o Treat a low blood sugar (less than 70 mg/dL) with  cup of clear juice (cranberry or apple), 4 glucose tablets, OR glucose gel. o Recheck blood sugar in 15 minutes after treatment (to make sure it is greater than 70 mg/dL). If your blood sugar is not greater than 70 mg/dL on recheck, call [phone] for further instructions. . Report your blood sugar to the short stay nurse when you get to Short Stay.  . If you are admitted to the hospital after surgery: o Your blood sugar will be checked by the staff and you will probably be given insulin after surgery (instead of oral diabetes medicines) to make sure you have good blood sugar levels. o The goal for blood sugar control after surgery is 80-180 mg/dL.              WHAT DO I DO ABOUT MY DIABETES MEDICATION?   Marland Kitchen Do not take oral diabetes medicines (pills) the morning of surgery.  . THE NIGHT BEFORE SURGERY, take _____7______ units of ____LANTUS_______insulin.       Marland Kitchen HE MORNING OF SURGERY, take ________NONE_____ units of ______0____insulin.  . The day of surgery, do not take other diabetes injectables, including Byetta (exenatide), Bydureon (exenatide ER), Victoza (liraglutide), or Trulicity (dulaglutide).  Marland Kitchen  If your CBG is greater than 220 mg/dL, you may take  of your sliding scale (correction) dose of insulin.  Other Instructions:          Patient Signature:  Date:   Nurse Signature:  Date:   Reviewed and Endorsed by [facility] Patient Education Committee, August 2015   Do not wear jewelry, make-up or nail polish.  Do not wear lotions, powders, or perfumes, or deoderant.  Do not shave 48 hours prior to surgery.  Men may shave face and neck.  Do not bring valuables to the hospital.  [facility] is not responsible for any belongings or valuables.  Contacts, dentures or bridgework may not be  worn into surgery.  Leave your suitcase in the car.  After surgery it may be brought to your room.  For patients admitted to the hospital, discharge time will be determined by your treatment team.  Patients discharged the day of surgery will not be allowed to drive home.   Name and phone number of your driver:    Special instructions:  SEE PREPARING FOR SURGERY  Please read over the following fact sheets that you were given. Surgical Site Infection Prevention

## 2016-07-13 NOTE — Progress Notes (Signed)
NOTIFIES Mark [name] OF ABNORMAL LABS (HGB 9.2, HCT 28.3, CREAT  2.06)

## 2016-07-14 ENCOUNTER — Ambulatory Visit (HOSPITAL_COMMUNITY): Payer: Medicaid Other | Admitting: Vascular Surgery

## 2016-07-14 ENCOUNTER — Encounter (HOSPITAL_COMMUNITY)
Admission: RE | Disposition: A | Discharge status: Home / Self Care | Payer: Self-pay | Source: Ambulatory Visit | Attending: General Surgery

## 2016-07-14 ENCOUNTER — Observation Stay (HOSPITAL_COMMUNITY)
Admission: RE | Admit: 2016-07-14 | Discharge: 2016-07-15 | Disposition: A | Discharge status: Home / Self Care | Payer: Medicaid Other | Source: Ambulatory Visit | Attending: General Surgery | Admitting: General Surgery

## 2016-07-14 ENCOUNTER — Encounter (HOSPITAL_COMMUNITY): Payer: Self-pay | Admitting: *Deleted

## 2016-07-14 DIAGNOSIS — D631 Anemia in chronic kidney disease: Secondary | ICD-10-CM | POA: Diagnosis not present

## 2016-07-14 DIAGNOSIS — I509 Heart failure, unspecified: Secondary | ICD-10-CM | POA: Insufficient documentation

## 2016-07-14 DIAGNOSIS — E1151 Type 2 diabetes mellitus with diabetic peripheral angiopathy without gangrene: Secondary | ICD-10-CM | POA: Insufficient documentation

## 2016-07-14 DIAGNOSIS — E1122 Type 2 diabetes mellitus with diabetic chronic kidney disease: Secondary | ICD-10-CM | POA: Diagnosis not present

## 2016-07-14 DIAGNOSIS — Z809 Family history of malignant neoplasm, unspecified: Secondary | ICD-10-CM | POA: Diagnosis not present

## 2016-07-14 DIAGNOSIS — Z89412 Acquired absence of left great toe: Secondary | ICD-10-CM | POA: Insufficient documentation

## 2016-07-14 DIAGNOSIS — I130 Hypertensive heart and chronic kidney disease with heart failure and stage 1 through stage 4 chronic kidney disease, or unspecified chronic kidney disease: Secondary | ICD-10-CM | POA: Insufficient documentation

## 2016-07-14 DIAGNOSIS — Z89512 Acquired absence of left leg below knee: Secondary | ICD-10-CM | POA: Insufficient documentation

## 2016-07-14 DIAGNOSIS — Z87891 Personal history of nicotine dependence: Secondary | ICD-10-CM | POA: Insufficient documentation

## 2016-07-14 DIAGNOSIS — Z7982 Long term (current) use of aspirin: Secondary | ICD-10-CM | POA: Diagnosis not present

## 2016-07-14 DIAGNOSIS — K432 Incisional hernia without obstruction or gangrene: Principal | ICD-10-CM | POA: Insufficient documentation

## 2016-07-14 DIAGNOSIS — K219 Gastro-esophageal reflux disease without esophagitis: Secondary | ICD-10-CM | POA: Diagnosis not present

## 2016-07-14 DIAGNOSIS — Z833 Family history of diabetes mellitus: Secondary | ICD-10-CM | POA: Insufficient documentation

## 2016-07-14 DIAGNOSIS — Z89422 Acquired absence of other left toe(s): Secondary | ICD-10-CM | POA: Diagnosis not present

## 2016-07-14 DIAGNOSIS — Z794 Long term (current) use of insulin: Secondary | ICD-10-CM | POA: Insufficient documentation

## 2016-07-14 DIAGNOSIS — Z8619 Personal history of other infectious and parasitic diseases: Secondary | ICD-10-CM | POA: Diagnosis not present

## 2016-07-14 DIAGNOSIS — N183 Chronic kidney disease, stage 3 (moderate): Secondary | ICD-10-CM | POA: Insufficient documentation

## 2016-07-14 HISTORY — PX: INSERTION OF MESH: SHX5868

## 2016-07-14 HISTORY — PX: INCISIONAL HERNIA REPAIR: SHX193

## 2016-07-14 LAB — CREATININE, SERUM
Creatinine, Ser: 1.87 mg/dL — ABNORMAL HIGH (ref 0.61–1.24)
GFR calc Af Amer: 42 mL/min — ABNORMAL LOW (ref >60)
GFR calc non Af Amer: 36 mL/min — ABNORMAL LOW (ref >60)

## 2016-07-14 LAB — RAPID URINE DRUG SCREEN, HOSP PERFORMED
Amphetamines: NOT DETECTED
Barbiturates: NOT DETECTED
Benzodiazepines: NOT DETECTED
Cocaine: NOT DETECTED
Opiates: NOT DETECTED
Tetrahydrocannabinol: NOT DETECTED

## 2016-07-14 LAB — GLUCOSE, CAPILLARY
Glucose-Capillary: 147 mg/dL — ABNORMAL HIGH (ref 65–99)
Glucose-Capillary: 134 mg/dL — ABNORMAL HIGH (ref 65–99)
Glucose-Capillary: 143 mg/dL — ABNORMAL HIGH (ref 65–99)
Glucose-Capillary: 122 mg/dL — ABNORMAL HIGH (ref 65–99)

## 2016-07-14 LAB — CBC
HCT: 26.8 % — ABNORMAL LOW (ref 39.0–52.0)
Hemoglobin: 8.7 g/dL — ABNORMAL LOW (ref 13.0–17.0)
MCH: 29.1 pg (ref 26.0–34.0)
MCHC: 32.5 g/dL (ref 30.0–36.0)
MCV: 89.6 fL (ref 78.0–100.0)
Platelets: 356 10*3/uL (ref 150–400)
RBC: 2.99 MIL/uL — ABNORMAL LOW (ref 4.22–5.81)
RDW: 13.8 % (ref 11.5–15.5)
WBC: 10.6 10*3/uL — ABNORMAL HIGH (ref 4.0–10.5)

## 2016-07-14 LAB — HEMOGLOBIN A1C
Hgb A1c MFr Bld: 7 % — ABNORMAL HIGH (ref 4.8–5.6)
Mean Plasma Glucose: 154 mg/dL

## 2016-07-14 SURGERY — REPAIR, HERNIA, INCISIONAL, LAPAROSCOPIC
Anesthesia: General

## 2016-07-14 MED ORDER — CELECOXIB 200 MG PO CAPS
400.0000 mg | ORAL_CAPSULE | ORAL | Status: AC
  Administered 2016-07-14: 400 mg via ORAL
  Filled 2016-07-14: qty 2

## 2016-07-14 MED ORDER — LACTATED RINGERS IV SOLN
INTRAVENOUS | Status: DC | PRN
  Administered 2016-07-14 (×3): via INTRAVENOUS

## 2016-07-14 MED ORDER — MORPHINE SULFATE (PF) 2 MG/ML IV SOLN: 2.0000 mg | INTRAVENOUS | Status: DC | PRN

## 2016-07-14 MED ORDER — HYDROMORPHONE HCL 1 MG/ML IJ SOLN: 0.2500 mg | INTRAMUSCULAR | Status: DC | PRN

## 2016-07-14 MED ORDER — SODIUM CHLORIDE 0.9 % IV SOLN
INTRAVENOUS | Status: DC
  Administered 2016-07-14: 22:00:00 via INTRAVENOUS

## 2016-07-14 MED ORDER — PROPOFOL 10 MG/ML IV BOLUS
INTRAVENOUS | Status: DC | PRN
  Administered 2016-07-14: 140 mg via INTRAVENOUS

## 2016-07-14 MED ORDER — AMLODIPINE BESYLATE 5 MG PO TABS
5.0000 mg | ORAL_TABLET | Freq: Every day | ORAL | Status: DC
  Administered 2016-07-15: 5 mg via ORAL
  Filled 2016-07-14: qty 1

## 2016-07-14 MED ORDER — CHLORHEXIDINE GLUCONATE CLOTH 2 % EX PADS: 6.0000 | MEDICATED_PAD | Freq: Once | CUTANEOUS | Status: DC

## 2016-07-14 MED ORDER — ROCURONIUM BROMIDE 100 MG/10ML IV SOLN
INTRAVENOUS | Status: DC | PRN
Start: 2016-07-14 — End: 2016-07-14
  Administered 2016-07-14: 50 mg via INTRAVENOUS
  Administered 2016-07-14: 10 mg via INTRAVENOUS

## 2016-07-14 MED ORDER — FENTANYL CITRATE (PF) 100 MCG/2ML IJ SOLN
INTRAMUSCULAR | Status: DC | PRN
  Administered 2016-07-14 (×4): 50 ug via INTRAVENOUS

## 2016-07-14 MED ORDER — ONDANSETRON 4 MG PO TBDP: 4.0000 mg | ORAL_TABLET | Freq: Four times a day (QID) | ORAL | Status: DC | PRN

## 2016-07-14 MED ORDER — EPHEDRINE SULFATE-NACL 50-0.9 MG/10ML-% IV SOSY
PREFILLED_SYRINGE | INTRAVENOUS | Status: DC | PRN
  Administered 2016-07-14: 10 mg via INTRAVENOUS

## 2016-07-14 MED ORDER — ATORVASTATIN CALCIUM 20 MG PO TABS
20.0000 mg | ORAL_TABLET | Freq: Every day | ORAL | Status: DC
  Administered 2016-07-15: 20 mg via ORAL
  Filled 2016-07-14: qty 1

## 2016-07-14 MED ORDER — HEPARIN SODIUM (PORCINE) 5000 UNIT/ML IJ SOLN
5000.0000 [IU] | Freq: Three times a day (TID) | INTRAMUSCULAR | Status: DC
  Administered 2016-07-14 – 2016-07-15 (×3): 5000 [IU] via SUBCUTANEOUS
  Filled 2016-07-14 (×3): qty 1

## 2016-07-14 MED ORDER — INSULIN ASPART 100 UNIT/ML ~~LOC~~ SOLN
4.0000 [IU] | Freq: Three times a day (TID) | SUBCUTANEOUS | Status: DC
  Administered 2016-07-15: 4 [IU] via SUBCUTANEOUS

## 2016-07-14 MED ORDER — MIDAZOLAM HCL 5 MG/5ML IJ SOLN
INTRAMUSCULAR | Status: DC | PRN
Start: 2016-07-14 — End: 2016-07-14
  Administered 2016-07-14: 1 mg via INTRAVENOUS

## 2016-07-14 MED ORDER — ONDANSETRON HCL 4 MG/2ML IJ SOLN
INTRAMUSCULAR | Status: AC
  Filled 2016-07-14: qty 2

## 2016-07-14 MED ORDER — CEFAZOLIN SODIUM-DEXTROSE 2-4 GM/100ML-% IV SOLN
2.0000 g | INTRAVENOUS | Status: AC
  Administered 2016-07-14: 2 g via INTRAVENOUS
  Filled 2016-07-14: qty 100

## 2016-07-14 MED ORDER — MIDAZOLAM HCL 2 MG/2ML IJ SOLN
INTRAMUSCULAR | Status: AC
  Filled 2016-07-14: qty 2

## 2016-07-14 MED ORDER — BUPIVACAINE HCL (PF) 0.25 % IJ SOLN
INTRAMUSCULAR | Status: AC
  Filled 2016-07-14: qty 30

## 2016-07-14 MED ORDER — GABAPENTIN 300 MG PO CAPS
300.0000 mg | ORAL_CAPSULE | ORAL | Status: AC
  Administered 2016-07-14: 300 mg via ORAL
  Filled 2016-07-14: qty 1

## 2016-07-14 MED ORDER — FENTANYL CITRATE (PF) 100 MCG/2ML IJ SOLN
INTRAMUSCULAR | Status: AC
  Filled 2016-07-14: qty 2

## 2016-07-14 MED ORDER — OXYCODONE HCL 5 MG/5ML PO SOLN: 5.0000 mg | Freq: Once | ORAL | Status: DC | PRN

## 2016-07-14 MED ORDER — LIDOCAINE 2% (20 MG/ML) 5 ML SYRINGE
INTRAMUSCULAR | Status: AC
  Filled 2016-07-14: qty 5

## 2016-07-14 MED ORDER — BUPIVACAINE-EPINEPHRINE 0.25% -1:200000 IJ SOLN
INTRAMUSCULAR | Status: DC | PRN
  Administered 2016-07-14: 30 mL

## 2016-07-14 MED ORDER — INSULIN GLARGINE 100 UNIT/ML ~~LOC~~ SOLN
15.0000 [IU] | Freq: Every day | SUBCUTANEOUS | Status: DC
  Administered 2016-07-14: 15 [IU] via SUBCUTANEOUS
  Filled 2016-07-14 (×3): qty 0.15

## 2016-07-14 MED ORDER — PROPOFOL 10 MG/ML IV BOLUS
INTRAVENOUS | Status: AC
  Filled 2016-07-14: qty 20

## 2016-07-14 MED ORDER — SIMETHICONE 80 MG PO CHEW: 40.0000 mg | CHEWABLE_TABLET | Freq: Four times a day (QID) | ORAL | Status: DC | PRN

## 2016-07-14 MED ORDER — SUGAMMADEX SODIUM 200 MG/2ML IV SOLN
INTRAVENOUS | Status: DC | PRN
  Administered 2016-07-14: 200 mg via INTRAVENOUS

## 2016-07-14 MED ORDER — SUGAMMADEX SODIUM 200 MG/2ML IV SOLN
INTRAVENOUS | Status: AC
  Filled 2016-07-14: qty 2

## 2016-07-14 MED ORDER — ACETAMINOPHEN 500 MG PO TABS: 500.0000 mg | ORAL_TABLET | Freq: Four times a day (QID) | ORAL | Status: DC | PRN

## 2016-07-14 MED ORDER — ONDANSETRON HCL 4 MG/2ML IJ SOLN: 4.0000 mg | Freq: Four times a day (QID) | INTRAMUSCULAR | Status: DC | PRN

## 2016-07-14 MED ORDER — ACETAMINOPHEN 325 MG PO TABS: 325.0000 mg | ORAL_TABLET | ORAL | Status: DC | PRN

## 2016-07-14 MED ORDER — DIPHENHYDRAMINE HCL 50 MG/ML IJ SOLN: 25.0000 mg | Freq: Four times a day (QID) | INTRAMUSCULAR | Status: DC | PRN

## 2016-07-14 MED ORDER — DIPHENHYDRAMINE HCL 25 MG PO CAPS: 25.0000 mg | ORAL_CAPSULE | Freq: Four times a day (QID) | ORAL | Status: DC | PRN

## 2016-07-14 MED ORDER — CARVEDILOL 12.5 MG PO TABS
12.5000 mg | ORAL_TABLET | Freq: Two times a day (BID) | ORAL | Status: DC
  Administered 2016-07-15: 12.5 mg via ORAL
  Filled 2016-07-14: qty 1

## 2016-07-14 MED ORDER — 0.9 % SODIUM CHLORIDE (POUR BTL) OPTIME
TOPICAL | Status: DC | PRN
  Administered 2016-07-14: 1000 mL

## 2016-07-14 MED ORDER — OXYCODONE HCL 5 MG PO TABS
5.0000 mg | ORAL_TABLET | ORAL | Status: DC | PRN
  Administered 2016-07-15 (×2): 10 mg via ORAL
  Filled 2016-07-14 (×2): qty 2

## 2016-07-14 MED ORDER — ACETAMINOPHEN 160 MG/5ML PO SOLN: 325.0000 mg | ORAL | Status: DC | PRN

## 2016-07-14 MED ORDER — ONDANSETRON HCL 4 MG/2ML IJ SOLN
INTRAMUSCULAR | Status: DC | PRN
  Administered 2016-07-14: 4 mg via INTRAVENOUS

## 2016-07-14 MED ORDER — ACETAMINOPHEN 500 MG PO TABS
1000.0000 mg | ORAL_TABLET | ORAL | Status: AC
  Administered 2016-07-14: 1000 mg via ORAL
  Filled 2016-07-14: qty 2

## 2016-07-14 MED ORDER — SODIUM CHLORIDE 0.9 % IV SOLN
INTRAVENOUS | Status: DC
  Administered 2016-07-14: 12:00:00 via INTRAVENOUS

## 2016-07-14 MED ORDER — OXYCODONE HCL 5 MG PO TABS: 5.0000 mg | ORAL_TABLET | Freq: Once | ORAL | Status: DC | PRN

## 2016-07-14 MED ORDER — FUROSEMIDE 20 MG PO TABS
20.0000 mg | ORAL_TABLET | Freq: Every day | ORAL | Status: DC
  Administered 2016-07-15: 20 mg via ORAL
  Filled 2016-07-14: qty 1

## 2016-07-14 MED ORDER — ROCURONIUM BROMIDE 10 MG/ML (PF) SYRINGE
PREFILLED_SYRINGE | INTRAVENOUS | Status: AC
  Filled 2016-07-14: qty 10

## 2016-07-14 MED ORDER — LIDOCAINE HCL (CARDIAC) 20 MG/ML IV SOLN
INTRAVENOUS | Status: DC | PRN
  Administered 2016-07-14: 60 mg via INTRAVENOUS

## 2016-07-14 MED ORDER — INSULIN ASPART 100 UNIT/ML ~~LOC~~ SOLN
0.0000 [IU] | Freq: Three times a day (TID) | SUBCUTANEOUS | Status: DC
  Administered 2016-07-15: 3 [IU] via SUBCUTANEOUS

## 2016-07-14 MED ORDER — DIVALPROEX SODIUM ER 500 MG PO TB24
500.0000 mg | ORAL_TABLET | Freq: Every day | ORAL | Status: DC
  Administered 2016-07-15: 500 mg via ORAL
  Filled 2016-07-14: qty 1

## 2016-07-14 SURGICAL SUPPLY — 51 items
ADH SKN CLS APL DERMABOND .7 (GAUZE/BANDAGES/DRESSINGS) ×3
APL SKNCLS STERI-STRIP NONHPOA (GAUZE/BANDAGES/DRESSINGS) ×1
APPLIER CLIP 5 13 M/L LIGAMAX5 (MISCELLANEOUS)
APR CLP MED LRG 5 ANG JAW (MISCELLANEOUS)
BANDAGE ADH SHEER 1  50/CT (GAUZE/BANDAGES/DRESSINGS) IMPLANT
BENZOIN TINCTURE PRP APPL 2/3 (GAUZE/BANDAGES/DRESSINGS) ×2 IMPLANT
BLADE SURG ROTATE 9660 (MISCELLANEOUS) IMPLANT
CANISTER SUCTION 2500CC (MISCELLANEOUS) IMPLANT
CHLORAPREP W/TINT 26ML (MISCELLANEOUS) ×2 IMPLANT
CLIP APPLIE 5 13 M/L LIGAMAX5 (MISCELLANEOUS) IMPLANT
COVER SURGICAL LIGHT HANDLE (MISCELLANEOUS) ×2 IMPLANT
DERMABOND ADVANCED (GAUZE/BANDAGES/DRESSINGS) ×3
DERMABOND ADVANCED .7 DNX12 (GAUZE/BANDAGES/DRESSINGS) ×1 IMPLANT
DEVICE SECURE STRAP 25 ABSORB (INSTRUMENTS) ×4 IMPLANT
ELECT REM PT RETURN 9FT ADLT (ELECTROSURGICAL) ×2
ELECTRODE REM PT RTRN 9FT ADLT (ELECTROSURGICAL) ×1 IMPLANT
GLOVE BIO SURGEON STRL SZ7.5 (GLOVE) ×1 IMPLANT
GLOVE BIOGEL PI IND STRL 7.0 (GLOVE) ×1 IMPLANT
GLOVE BIOGEL PI IND STRL 8 (GLOVE) IMPLANT
GLOVE BIOGEL PI INDICATOR 7.0 (GLOVE) ×1
GLOVE BIOGEL PI INDICATOR 8 (GLOVE) ×2
GLOVE ECLIPSE 7.5 STRL STRAW (GLOVE) ×1 IMPLANT
GLOVE SURG SS PI 7.0 STRL IVOR (GLOVE) ×3 IMPLANT
GOWN STRL REUS W/ TWL LRG LVL3 (GOWN DISPOSABLE) ×3 IMPLANT
GOWN STRL REUS W/ TWL XL LVL3 (GOWN DISPOSABLE) IMPLANT
GOWN STRL REUS W/TWL LRG LVL3 (GOWN DISPOSABLE) ×6
GOWN STRL REUS W/TWL XL LVL3 (GOWN DISPOSABLE) ×2
GRASPER SUT TROCAR 14GX15 (MISCELLANEOUS) ×2 IMPLANT
KIT BASIN OR (CUSTOM PROCEDURE TRAY) ×2 IMPLANT
KIT ROOM TURNOVER OR (KITS) ×2 IMPLANT
MESH VENTRALIGHT ST 8X10 (Mesh General) ×1 IMPLANT
NDL SPNL 20GX3.5 QUINCKE YW (NEEDLE) ×1 IMPLANT
NEEDLE SPNL 20GX3.5 QUINCKE YW (NEEDLE) ×2 IMPLANT
NS IRRIG 1000ML POUR BTL (IV SOLUTION) ×2 IMPLANT
PAD ARMBOARD 7.5X6 YLW CONV (MISCELLANEOUS) ×4 IMPLANT
SCALPEL HARMONIC ACE (MISCELLANEOUS) IMPLANT
SCISSORS LAP 5X35 DISP (ENDOMECHANICALS) ×2 IMPLANT
SET IRRIG TUBING LAPAROSCOPIC (IRRIGATION / IRRIGATOR) IMPLANT
SLEEVE ENDOPATH XCEL 5M (ENDOMECHANICALS) ×2 IMPLANT
STRIP CLOSURE SKIN 1/2X4 (GAUZE/BANDAGES/DRESSINGS) ×2 IMPLANT
SUT MNCRL AB 4-0 PS2 18 (SUTURE) ×2 IMPLANT
SUT NOVA NAB DX-16 0-1 5-0 T12 (SUTURE) ×2 IMPLANT
SUT NOVAFIL 0 T 12 (SUTURE) ×1 IMPLANT
SUT PDS 2 0 (SUTURE) ×2 IMPLANT
SUT PDS AB 0 CT 36 (SUTURE) ×1 IMPLANT
TOWEL OR 17X24 6PK STRL BLUE (TOWEL DISPOSABLE) ×2 IMPLANT
TOWEL OR 17X26 10 PK STRL BLUE (TOWEL DISPOSABLE) ×2 IMPLANT
TRAY LAPAROSCOPIC MC (CUSTOM PROCEDURE TRAY) ×2 IMPLANT
TROCAR XCEL NON-BLD 11X100MML (ENDOMECHANICALS) IMPLANT
TROCAR XCEL NON-BLD 5MMX100MML (ENDOMECHANICALS) ×2 IMPLANT
TUBING INSUFFLATION (TUBING) ×2 IMPLANT

## 2016-07-14 NOTE — Progress Notes (Signed)
Pt states he is unable to void at this time. Instructed pt to call out later so we can get UA. Voices understanding.

## 2016-07-14 NOTE — Progress Notes (Signed)
Urine sample collected and taken to lab.

## 2016-07-14 NOTE — Op Note (Signed)
Preoperative diagnosis: incisional hernia without obstruction or gangrene  Postoperative diagnosis: Same   Procedure: laparoscopic incisional hernia repair with mesh  Surgeon: Gurney Maxin, M.D.  Asst: none  Anesthesia: Gen.   Indications for procedure: Mark [name] is a 65 y.o. male with symptoms of abdominal pain and hernia reducible on exam. He had been in the ER with small bowel obstruction and reduced in the ER.  Description of procedure: The patient was brought into the operative suite, placed supine. Anesthesia was administered with endotracheal tube. Patient was strapped in place and foot board was secured. Both arms were tucked. All pressure points were offloaded by foam padding. Foley was placed in sterile fashion. The patient was prepped and draped in the usual sterile fashion.  A small incision was made over the rightsubcostal area and 23mm trocar was placed with optical entry. Pneumoperitoneum was applied with high flow low pressure. The abdominal cavity was inspected and there were a large amount of adhesions of bowel to the abdominal wall. 1 29mm trocar was placed in the right lower abdomen and an additional 30mm trocars 1 in the right midabdomen. All trocar sites were first anesthestized with 0.25% marcaine and trocars were placed under direct vision.  Sharp dissection was used to remove most of the filmy adhesions with occasional blunt dissection. Cautery was used to provide hemostasis. The hernia was identified and was filled with small bowel. It was slowly dissected free and reduced. The defect was about 3 cm in diameter. Further dissection of the abdominal wall showed 3 more hernias over the umbilical area and epigastric area all about 3cm in diameter. 0 PDS was used in interrupted fashion to appose the fascia of the left side hernia and epigastric hernia. The remainder of marcaine was infused in the preperitoneal area around all hernia sites. A 20x25cm ventralight mesh was  inserted and used to the repair the mesh. 6 transfascial 0 novafil sutures were used to secure the mesh in place and absorbable tackers were used to appose the mesh against the abdominal wall in all areas.  The abdominal contents were again inspected and hemostasis was intact.  0 vicryl was used to close the fascial defect of the 2mm trocar site using suture passer. Pneumoperitoneum was removed, all trocar were removed. All incisions were closed with 4-0 monocryl subcuticular stitch. The patient woke from anesthesia and was brought to PACU in stable condition.  Findings: 4 3cm incisional hernias  Specimen: none  Blood loss: <30 ml  Local anesthesia: 57ml 1.10% marcaine  Complications: none  Implant: 20x25cm ventralight ST mesh  Gurney Maxin, M.D. General, Bariatric, & Minimally Invasive Surgery [facility]
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
## 2016-07-14 NOTE — Transfer of Care (Signed)
Immediate Anesthesia Transfer of Care Note  Patient: Mark [name]  Procedure(s) Performed: Procedure(s): LAPAROSCOPIC INCISIONAL HERNIA (N/A) INSERTION OF MESH (N/A)  Patient Location: PACU  Anesthesia Type:General  Level of Consciousness: awake, alert  and oriented  Airway & Oxygen Therapy: Patient Spontanous Breathing and Patient connected to nasal cannula oxygen  Post-op Assessment: Report given to RN, Post -op Vital signs reviewed and stable and Patient moving all extremities X 4  Post vital signs: Reviewed and stable  Last Vitals:  Vitals:   07/14/16 1212  BP: (!) 158/81  Pulse: (!) 58  Resp: 18  Temp: 36.9 C    Last Pain:  Vitals:   07/14/16 1212  TempSrc: Oral         Complications: No apparent anesthesia complications

## 2016-07-14 NOTE — Anesthesia Preprocedure Evaluation (Addendum)
Anesthesia Evaluation  Patient identified by MRN, date of birth, ID band Patient awake    Reviewed: Allergy & Precautions, NPO status , Patient's Chart, lab work & pertinent test results, reviewed documented beta blocker date and time   History of Anesthesia Complications Negative for: history of anesthetic complications  Airway Mallampati: II  TM Distance: >3 FB Neck ROM: Full    Dental  (+) Edentulous Upper, Edentulous Lower   Pulmonary neg pulmonary ROS, former smoker,    breath sounds clear to auscultation       Cardiovascular hypertension, Pt. on medications and Pt. on home beta blockers + Peripheral Vascular Disease and +CHF   Rhythm:Regular     Neuro/Psych Seizures -,  PSYCHIATRIC DISORDERS Allegedly due to low bld gluc  Neuromuscular disease    GI/Hepatic Neg liver ROS, GERD  Medicated and Controlled,  Endo/Other  diabetes, Poorly Controlled, Type 2, Insulin DependentHx hypoglycemia and syncope.  Might not be using Lantus properly.  Renal/GU Renal InsufficiencyRenal disease     Musculoskeletal   Abdominal   Peds  Hematology  (+) anemia ,   Anesthesia Other Findings History includes former smoker (quit 08/03/13), HTN, CKD (hospitalized for acute on chronic KD with Cr 4.43 03/2015), DM2, CHF, C. Difficile colitis 01/01/16, anemia, seizures, cocaine use (+UDS 01/01/16), elevated troponin (0.09 01/02/16, 0.04 10/16/14), left BKA 11/21/13, splenectomy, colon surgery '89. - 06/26/16 ED: Incarcerated abdominal hernia s/p reduction in ED.  - 06/03/16 ED: LOC/near sycnope ? Due to hypoglycemia.  - 05/10/16 ED: Right shoulder pain after fall. - 04/14/16 ED: Toe injury. - 01/01/16-01/08/16 admission for acute encephalapathy possibly multifactorial: Hypoglycemia related to poor oral intake/oral hypoglycemics and worsening renal functions, cocaine abuse. He developed seizure like activity on 01/05/16 and was started on medication. LP  unremarkable. Had elevated troponin (peak 0.09) felt due to demand ischemia related to hypoglycemia, dehydration, AKI, and cocaine abuse. EF and wall motion abnormality improved. Neurology and ortho consulted that admission.  - 04/16/15-04/22/15 admission for acute on chronic kidney disease (baseline Cr 1.6 with increase to 4.43), likely from ACEI and diuretic therapy.    - 10/16/14 - 10/18/14 admission for SOB and acute combined systolic and diastolic CHF. CTA negative for PE. Mildly elevated troponin felt likely due to demand ischemia. Out-patient cardiology referral recommended. (Saw Dr. Jenell Milliner. Although his note felt patient likely had underlying ischemic heart disease considering his DM and PVD history, he did not order a stress test and was treated medically. Last visit seen was on 12/25/14.)  Meds include amlodipine, ASA 81 mg, Lipitor, Coreg, Depakote, Fergon, Lasix, Lantus, lacosamide.   PCP is with [facility], last visit with Dr. Adrian Blackwater 05/04/16..  BP (!) 159/88   Pulse 62   Temp 36.9 C   Resp 18   Ht 6\' 2"  (1.88 m)   Wt 185 lb 14.4 oz (84.3 kg)   SpO2 100%   BMI 23.87 kg/m    06/03/16 EKG: SR, minimal ST elevation anterior leads.  01/03/16 Echo: Study Conclusions - Left ventricle: The cavity size was normal. Wall thickness was increased in a pattern of moderate LVH. Systolic function was normal. The estimated ejection fraction was in the range of 60% to 65%. Wall motion was normal; there were no regional wall motion abnormalities. Doppler parameters are consistent with abnormal left ventricular relaxation (grade 1 diastolic dysfunction). - Aortic valve: Nodular calcification of the non coronary cusp. - Left atrium: The atrium was mildly dilated. - Atrial septum:  No defect or patent foramen ovale was identified. (Comparison echo 10/17/14: LVEF 45-50% mild diffuse hypokinesis, grade 2 diastolic dysfunction, aortic root 38 mm,  mildly dilated ascending aorta.)  01/08/16 1V CXR: FINDINGS: Cardiac shadow is at the upper limits of normal in size. The lungs are well aerated bilaterally. Minimal right basilar atelectasis is seen. No sizable effusion is noted. No bony abnormality is seen. IMPRESSION: Minimal right basilar atelectasis.  06/23/16 CT abd/pelvis: IMPRESSION: Small bowel obstruction due to left lower quadrant ventral hernia containing a small bowel loop. Midline supraumbilical ventral hernia containing fat. Bilateral renal cysts. Left lower pole nephrolithiasis. Aortoiliac atherosclerosis.  01/06/16 EEG:  Clinical interpretation: This 24 hours of intensive EEG monitoring with simultaneous monitoring is abnormal due to reactive background activity slowing consistent with a moderate encephalopathy. In addition presence of generalized periodic epileptiform discharges GPEDs suggestive of cortical irritability, particularly involving frontal cortex.   Preoperative labs noted. BUN 34, Cr 2.06 (up from 1.59 on 06/23/16 and 1.90 on 06/03/16; range of 1.44-2.44 since 03/2015). Glucose 163. H/H 9.2/28.3 (HGB range 8.2-9.6 since 02/2016).   Reviewed above with anesthesiologist Dr. Ermalene Postin. Patient with multiple co-morbidities as outlined above,, but with recent incarcerated hernia reduced in ED that now needs repair. He has not had a stress test that I see, but follow-up echo this year showed normalization of his EF since 2016. His H/H appear stable--defer decision for T&S to surgeon and/or anesthesiologist. His Cr has been variable--baseline was 1.6 in 03/2015, but more recently has been primarily around the 1.9 range. He reported last cocaine was several years ago, but + UDS earlier this year. He will need a UDS on arrival. Definitive anesthesia plan following review of UDS and evaluation of patient.   Reproductive/Obstetrics                            Anesthesia Physical Anesthesia  Plan  ASA: III  Anesthesia Plan: General   Post-op Pain Management:    Induction: Intravenous  Airway Management Planned: Oral ETT  Additional Equipment: None  Intra-op Plan:   Post-operative Plan: Extubation in OR  Informed Consent: I have reviewed the patients History and Physical, chart, labs and discussed the procedure including the risks, benefits and alternatives for the proposed anesthesia with the patient or authorized representative who has indicated his/her understanding and acceptance.   Dental advisory given  Plan Discussed with: CRNA and Surgeon  Anesthesia Plan Comments:         Anesthesia Quick Evaluation

## 2016-07-14 NOTE — Anesthesia Postprocedure Evaluation (Signed)
Anesthesia Post Note  Patient: Mark [name]  Procedure(s) Performed: Procedure(s) (LRB): LAPAROSCOPIC INCISIONAL HERNIA (N/A) INSERTION OF MESH (N/A)  Patient location during evaluation: PACU Anesthesia Type: General Level of consciousness: awake, awake and alert, oriented and patient cooperative Pain management: pain level controlled Vital Signs Assessment: post-procedure vital signs reviewed and stable Respiratory status: spontaneous breathing, nonlabored ventilation and respiratory function stable Cardiovascular status: blood pressure returned to baseline Anesthetic complications: no    Last Vitals:  Vitals:   07/14/16 1933 07/14/16 1948  BP: (!) 147/80 (!) 157/80  Pulse: (!) 53 68  Resp: 11   Temp:  36.5 C    Last Pain:  Vitals:   07/14/16 1948  TempSrc: Oral  PainSc:                  Mark [name]

## 2016-07-14 NOTE — Anesthesia Procedure Notes (Signed)
Procedure Name: Intubation Date/Time: 07/14/2016 3:52 PM Performed by: Williemae Area B Pre-anesthesia Checklist: Patient identified, Emergency Drugs available, Suction available and Patient being monitored Patient Re-evaluated:Patient Re-evaluated prior to inductionOxygen Delivery Method: Circle system utilized Preoxygenation: Pre-oxygenation with 100% oxygen Intubation Type: IV induction Ventilation: Mask ventilation without difficulty Laryngoscope Size: Mac and 3 Grade View: Grade I Tube type: Oral Tube size: 7.5 mm Number of attempts: 1 Airway Equipment and Method: Stylet Placement Confirmation: ETT inserted through vocal cords under direct vision,  positive ETCO2 and breath sounds checked- equal and bilateral Secured at: 22 (cm at upper gum) cm Tube secured with: Tape Dental Injury: Teeth and Oropharynx as per pre-operative assessment

## 2016-07-15 ENCOUNTER — Encounter (HOSPITAL_COMMUNITY): Payer: Self-pay | Admitting: General Surgery

## 2016-07-15 DIAGNOSIS — K432 Incisional hernia without obstruction or gangrene: Secondary | ICD-10-CM | POA: Diagnosis not present

## 2016-07-15 LAB — BASIC METABOLIC PANEL
Anion gap: 5 (ref 5–15)
BUN: 31 mg/dL — ABNORMAL HIGH (ref 6–20)
CO2: 18 mmol/L — ABNORMAL LOW (ref 22–32)
Calcium: 8.6 mg/dL — ABNORMAL LOW (ref 8.9–10.3)
Chloride: 118 mmol/L — ABNORMAL HIGH (ref 101–111)
Creatinine, Ser: 1.71 mg/dL — ABNORMAL HIGH (ref 0.61–1.24)
GFR calc Af Amer: 47 mL/min — ABNORMAL LOW (ref >60)
GFR calc non Af Amer: 41 mL/min — ABNORMAL LOW (ref >60)
Glucose, Bld: 80 mg/dL (ref 65–99)
Potassium: 3.6 mmol/L (ref 3.5–5.1)
Sodium: 141 mmol/L (ref 135–145)

## 2016-07-15 LAB — GLUCOSE, CAPILLARY
Glucose-Capillary: 79 mg/dL (ref 65–99)
Glucose-Capillary: 162 mg/dL — ABNORMAL HIGH (ref 65–99)

## 2016-07-15 LAB — CBC
HCT: 24.9 % — ABNORMAL LOW (ref 39.0–52.0)
Hemoglobin: 8.1 g/dL — ABNORMAL LOW (ref 13.0–17.0)
MCH: 29.1 pg (ref 26.0–34.0)
MCHC: 32.5 g/dL (ref 30.0–36.0)
MCV: 89.6 fL (ref 78.0–100.0)
Platelets: 332 10*3/uL (ref 150–400)
RBC: 2.78 MIL/uL — ABNORMAL LOW (ref 4.22–5.81)
RDW: 14 % (ref 11.5–15.5)
WBC: 8.3 10*3/uL (ref 4.0–10.5)

## 2016-07-15 MED ORDER — OXYCODONE HCL 5 MG PO TABS: 5.0000 mg | ORAL_TABLET | ORAL | 0 refills | Status: DC | PRN

## 2016-07-15 NOTE — Progress Notes (Signed)
Pt discharged to home.  Discharge instructions explained to pt. PT has no questions at the time of discharge.  IV removed.  Pt states he has all belongings.  Pt taken off unit via wheelchair by staff.

## 2016-07-15 NOTE — Discharge Summary (Signed)
Physician Discharge Summary  Patient ID: Mark [name] MRN: [id number] DOB/AGE: [birth year] 64 y.o.  Admit date: 07/14/2016 Discharge date: 07/15/2016  Admission Diagnoses:  Discharge Diagnoses:  Active Problems:   Incisional hernia   Discharged Condition: good  Hospital Course: 65 yo male underwent lap incisional hernia repair. Post op he was observed overnight. POD 1 his pain was well controlled, he tolerated a diet and was discharged home  Consults: None  Significant Diagnostic Studies: labs:  CBC    Component Value Date/Time   WBC 8.3 07/15/2016 0658   RBC 2.78 (L) 07/15/2016 0658   HGB 8.1 (L) 07/15/2016 0658   HCT 24.9 (L) 07/15/2016 0658   PLT 332 07/15/2016 0658   MCV 89.6 07/15/2016 0658   MCV 97.5 (A) 09/26/2013 1931   MCH 29.1 07/15/2016 0658   MCHC 32.5 07/15/2016 0658   RDW 14.0 07/15/2016 0658   LYMPHSABS 2.5 07/13/2016 1028   MONOABS 0.4 07/13/2016 1028   EOSABS 0.5 07/13/2016 1028   BASOSABS 0.0 07/13/2016 1028     Treatments: surgery lap incisional hernia repair with mesh  Discharge Exam: Blood pressure 119/71, pulse 60, temperature 97.8 F (36.6 C), temperature source Oral, resp. rate 11, height 6' 2"  (1.88 m), weight 83.9 kg (185 lb), SpO2 100 %. General appearance: alert and cooperative Head: Normocephalic, without obvious abnormality, atraumatic Resp: clear to auscultation bilaterally Cardio: regular rate and rhythm, S1, S2 normal, no murmur, click, rub or gallop GI: incisions c/d/i, appropriate pain to palpation  Disposition: 01-Home or Self Care     Medication List    TAKE these medications   ACCU-CHEK AVIVA PLUS w/Device Kit Use as prescribed TID before meals and QHS   acetaminophen 500 MG tablet Commonly known as:  TYLENOL Take 1 tablet (500 mg total) by mouth every 6 (six) hours as needed.   amLODipine 5 MG tablet Commonly known as:  NORVASC TAKE ONE TABLET BY MOUTH ONCE DAILY   aspirin 81 MG EC tablet Take 1 tablet (81  mg total) by mouth daily.   atorvastatin 20 MG tablet Commonly known as:  LIPITOR Take 1 tablet (20 mg total) by mouth daily.   B-D UF III MINI PEN NEEDLES 31G X 5 MM Misc Generic drug:  Insulin Pen Needle USE TO INJECT INSULIN AS DIRECTED BY PHYSICIAN   carvedilol 12.5 MG tablet Commonly known as:  COREG Take 1 tablet (12.5 mg total) by mouth 2 (two) times daily with a meal.   divalproex 500 MG 24 hr tablet Commonly known as:  DEPAKOTE ER Take one tablet twice a day for 3 weeks and then take one tablet daily for 3 weeks, then stop   ferrous gluconate 324 MG tablet Commonly known as:  FERGON Take 1 tablet (324 mg total) by mouth 2 (two) times daily with a meal.   furosemide 20 MG tablet Commonly known as:  LASIX Take 1 tablet (20 mg total) by mouth daily.   glucose blood test strip Commonly known as:  ACCU-CHEK AVIVA Use as instructed   Insulin Glargine 100 UNIT/ML Solostar Pen Commonly known as:  LANTUS SOLOSTAR Inject 15 Units into the skin daily at 10 pm.   INSULIN SYRINGE .5CC/30GX5/16" 30G X 5/16" 0.5 ML Misc Check blood sugar TID & QHS   lacosamide 200 MG Tabs tablet Commonly known as:  VIMPAT Take 1 tablet (200 mg total) by mouth 2 (two) times daily.   oxyCODONE 5 MG immediate release tablet Commonly known as:  Oxy IR/ROXICODONE Take 1-2  tablets (5-10 mg total) by mouth every 4 (four) hours as needed for moderate pain.      Follow-up Information    Mickeal Skinner, MD Follow up.   Specialty:  General Surgery Contact information: [address] [place] [address] [phone]           Signed: Arta Bruce [name] 07/15/2016, 12:31 PM

## 2016-07-26 ENCOUNTER — Telehealth (INDEPENDENT_AMBULATORY_CARE_PROVIDER_SITE_OTHER): Payer: Self-pay | Admitting: Orthopedic Surgery

## 2016-07-26 MED FILL — ACCU-CHEK AVIVA PLUS TEST S: 25 days supply | Qty: 100 | Fill #2

## 2016-07-26 MED FILL — FUROSEMIDE 20 MG TABLET: 20 | 30 days supply | Qty: 30 | Fill #0

## 2016-07-26 MED FILL — ACCU-CHEK SOFTCLIX LANCETS: 30 days supply | Qty: 100 | Fill #1

## 2016-07-26 NOTE — Telephone Encounter (Signed)
Pt request a new handicap form competed

## 2016-07-26 NOTE — Telephone Encounter (Signed)
I called to advise handicap placard is signed and at front desk for him to pick up when he is ready.

## 2016-07-28 ENCOUNTER — Ambulatory Visit: Payer: Medicaid Other | Admitting: Podiatry

## 2016-08-04 ENCOUNTER — Ambulatory Visit (INDEPENDENT_AMBULATORY_CARE_PROVIDER_SITE_OTHER): Payer: Medicaid Other | Admitting: Podiatry

## 2016-08-04 ENCOUNTER — Encounter: Payer: Self-pay | Admitting: Podiatry

## 2016-08-04 VITALS — BP 137/71 | HR 67 | Resp 18

## 2016-08-04 DIAGNOSIS — B351 Tinea unguium: Secondary | ICD-10-CM

## 2016-08-04 DIAGNOSIS — M79674 Pain in right toe(s): Secondary | ICD-10-CM

## 2016-08-04 NOTE — Patient Instructions (Signed)

## 2016-08-05 NOTE — Progress Notes (Signed)
Patient ID: Mark [name], male   DOB: [birth year], 65 y.o.   MRN: [id number]    Subjective: This diabetic patient presents today for a scheduled visit for debridement of toenails on the right foot Is a diabetic with a history of BK amputation left and 2014  Objective:  Orientated 3  Vascular: DP and PT right 2/4 Capillary reflex right immediate  Neurological: Sensation to 10 g monofilament wire intact 3/5 right Vibratory sensation nonreactive right Ankle reflexe reactive right  Dermatological: No open skin lesions on the right Toenails 1-5 are extremely hypertrophic, elongated, deformed and tender to direct palpation The right hallux is low-grade edema on the dorsal aspect without any open lesions, warmth or drainage  Musculoskeletal: 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

## 2016-09-05 ENCOUNTER — Emergency Department (HOSPITAL_COMMUNITY)
Admission: EM | Admit: 2016-09-05 | Discharge: 2016-09-05 | Disposition: A | Discharge status: Home / Self Care | Payer: Medicaid Other | Attending: Emergency Medicine | Admitting: Emergency Medicine

## 2016-09-05 ENCOUNTER — Encounter (HOSPITAL_COMMUNITY): Payer: Self-pay

## 2016-09-05 DIAGNOSIS — N183 Chronic kidney disease, stage 3 (moderate): Secondary | ICD-10-CM | POA: Diagnosis not present

## 2016-09-05 DIAGNOSIS — Y939 Activity, unspecified: Secondary | ICD-10-CM | POA: Diagnosis not present

## 2016-09-05 DIAGNOSIS — Y929 Unspecified place or not applicable: Secondary | ICD-10-CM | POA: Insufficient documentation

## 2016-09-05 DIAGNOSIS — T24131A Burn of first degree of right lower leg, initial encounter: Secondary | ICD-10-CM | POA: Diagnosis not present

## 2016-09-05 DIAGNOSIS — Z87891 Personal history of nicotine dependence: Secondary | ICD-10-CM | POA: Insufficient documentation

## 2016-09-05 DIAGNOSIS — T24031A Burn of unspecified degree of right lower leg, initial encounter: Secondary | ICD-10-CM | POA: Diagnosis present

## 2016-09-05 DIAGNOSIS — Y999 Unspecified external cause status: Secondary | ICD-10-CM | POA: Diagnosis not present

## 2016-09-05 DIAGNOSIS — E1122 Type 2 diabetes mellitus with diabetic chronic kidney disease: Secondary | ICD-10-CM | POA: Insufficient documentation

## 2016-09-05 DIAGNOSIS — I5042 Chronic combined systolic (congestive) and diastolic (congestive) heart failure: Secondary | ICD-10-CM | POA: Diagnosis not present

## 2016-09-05 DIAGNOSIS — X16XXXA Contact with hot heating appliances, radiators and pipes, initial encounter: Secondary | ICD-10-CM | POA: Insufficient documentation

## 2016-09-05 DIAGNOSIS — I130 Hypertensive heart and chronic kidney disease with heart failure and stage 1 through stage 4 chronic kidney disease, or unspecified chronic kidney disease: Secondary | ICD-10-CM | POA: Diagnosis not present

## 2016-09-05 DIAGNOSIS — T24001A Burn of unspecified degree of unspecified site of right lower limb, except ankle and foot, initial encounter: Secondary | ICD-10-CM

## 2016-09-05 DIAGNOSIS — Z794 Long term (current) use of insulin: Secondary | ICD-10-CM | POA: Diagnosis not present

## 2016-09-05 DIAGNOSIS — Z7982 Long term (current) use of aspirin: Secondary | ICD-10-CM | POA: Diagnosis not present

## 2016-09-05 DIAGNOSIS — Z79899 Other long term (current) drug therapy: Secondary | ICD-10-CM | POA: Insufficient documentation

## 2016-09-05 MED ORDER — SILVER SULFADIAZINE 1 % EX CREA
TOPICAL_CREAM | Freq: Once | CUTANEOUS | Status: AC
  Administered 2016-09-05: 11:00:00 via TOPICAL
  Filled 2016-09-05: qty 50

## 2016-09-05 MED ORDER — CEPHALEXIN 500 MG PO CAPS: ORAL_CAPSULE | ORAL | 0 refills | Status: DC

## 2016-09-05 MED ORDER — SILVER SULFADIAZINE 1 % EX CREA: 1.0000 "application " | TOPICAL_CREAM | Freq: Two times a day (BID) | CUTANEOUS | 0 refills | Status: DC
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
## 2016-09-05 NOTE — Discharge Instructions (Signed)
Keep wound clean and dry. Apply warm compresses to affected area throughout the day. Take antibiotic until it is finished. Use silvadene cream as directed, using it twice daily with each dressing change. Use tylenol or motrin as needed for pain. Followup with [facility]/Primary Care doctor in 2 days for wound recheck and with the wound care center in 1 week for ongoing burn management. Monitor area for signs of infection to include, but not limited to: increasing pain, spreading redness, drainage/pus, worsening swelling, or fevers. Return to emergency department for emergent changing or worsening symptoms.

## 2016-09-05 NOTE — ED Notes (Signed)
Bed: WTR7 Expected date:  Expected time:  Means of arrival:  Comments: 

## 2016-09-14 ENCOUNTER — Inpatient Hospital Stay: Payer: Medicaid Other | Admitting: Family Medicine

## 2016-09-17 ENCOUNTER — Ambulatory Visit: Payer: Medicaid Other | Attending: Family Medicine | Admitting: Physician Assistant

## 2016-09-17 VITALS — BP 175/77 | HR 74 | Temp 98.6°F | Resp 16 | Wt 217.4 lb

## 2016-09-17 DIAGNOSIS — I130 Hypertensive heart and chronic kidney disease with heart failure and stage 1 through stage 4 chronic kidney disease, or unspecified chronic kidney disease: Secondary | ICD-10-CM | POA: Diagnosis not present

## 2016-09-17 DIAGNOSIS — M7989 Other specified soft tissue disorders: Secondary | ICD-10-CM | POA: Diagnosis not present

## 2016-09-17 DIAGNOSIS — Z89512 Acquired absence of left leg below knee: Secondary | ICD-10-CM | POA: Diagnosis not present

## 2016-09-17 DIAGNOSIS — K219 Gastro-esophageal reflux disease without esophagitis: Secondary | ICD-10-CM | POA: Diagnosis not present

## 2016-09-17 DIAGNOSIS — Z7982 Long term (current) use of aspirin: Secondary | ICD-10-CM | POA: Diagnosis not present

## 2016-09-17 DIAGNOSIS — R569 Unspecified convulsions: Secondary | ICD-10-CM | POA: Diagnosis not present

## 2016-09-17 DIAGNOSIS — I10 Essential (primary) hypertension: Secondary | ICD-10-CM

## 2016-09-17 DIAGNOSIS — T3110 Burns involving 10-19% of body surface with 0% to 9% third degree burns: Secondary | ICD-10-CM | POA: Diagnosis not present

## 2016-09-17 DIAGNOSIS — S91131D Puncture wound without foreign body of right great toe without damage to nail, subsequent encounter: Secondary | ICD-10-CM

## 2016-09-17 DIAGNOSIS — Z794 Long term (current) use of insulin: Secondary | ICD-10-CM | POA: Diagnosis not present

## 2016-09-17 DIAGNOSIS — I509 Heart failure, unspecified: Secondary | ICD-10-CM | POA: Insufficient documentation

## 2016-09-17 DIAGNOSIS — Z8619 Personal history of other infectious and parasitic diseases: Secondary | ICD-10-CM | POA: Diagnosis not present

## 2016-09-17 DIAGNOSIS — N183 Chronic kidney disease, stage 3 (moderate): Secondary | ICD-10-CM | POA: Diagnosis not present

## 2016-09-17 DIAGNOSIS — I5042 Chronic combined systolic (congestive) and diastolic (congestive) heart failure: Secondary | ICD-10-CM

## 2016-09-17 DIAGNOSIS — X19XXXA Contact with other heat and hot substances, initial encounter: Secondary | ICD-10-CM | POA: Insufficient documentation

## 2016-09-17 DIAGNOSIS — G546 Phantom limb syndrome with pain: Secondary | ICD-10-CM | POA: Diagnosis not present

## 2016-09-17 DIAGNOSIS — E1122 Type 2 diabetes mellitus with diabetic chronic kidney disease: Secondary | ICD-10-CM | POA: Diagnosis not present

## 2016-09-17 DIAGNOSIS — E1159 Type 2 diabetes mellitus with other circulatory complications: Secondary | ICD-10-CM | POA: Diagnosis not present

## 2016-09-17 DIAGNOSIS — D509 Iron deficiency anemia, unspecified: Secondary | ICD-10-CM | POA: Insufficient documentation

## 2016-09-17 MED ORDER — FUROSEMIDE 20 MG PO TABS: 20.0000 mg | ORAL_TABLET | Freq: Every day | ORAL | 2 refills | Status: DC

## 2016-09-17 MED FILL — FUROSEMIDE 20 MG TABLET: 20 | 30 days supply | Qty: 30 | Fill #0

## 2016-09-17 NOTE — Progress Notes (Signed)
Pt is in the office today for ed follow up Pt states his pain level today in the office is a 5 Pt states his pain is coming from his leg
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
## 2016-09-21 ENCOUNTER — Encounter (HOSPITAL_BASED_OUTPATIENT_CLINIC_OR_DEPARTMENT_OTHER): Payer: Medicaid Other | Attending: Surgery

## 2016-09-21 DIAGNOSIS — X58XXXA Exposure to other specified factors, initial encounter: Secondary | ICD-10-CM | POA: Insufficient documentation

## 2016-09-21 DIAGNOSIS — T24231A Burn of second degree of right lower leg, initial encounter: Secondary | ICD-10-CM | POA: Diagnosis not present

## 2016-09-21 DIAGNOSIS — I959 Hypotension, unspecified: Secondary | ICD-10-CM | POA: Insufficient documentation

## 2016-09-21 DIAGNOSIS — Z89512 Acquired absence of left leg below knee: Secondary | ICD-10-CM | POA: Diagnosis not present

## 2016-09-21 DIAGNOSIS — I509 Heart failure, unspecified: Secondary | ICD-10-CM | POA: Insufficient documentation

## 2016-09-21 DIAGNOSIS — F1911 Other psychoactive substance abuse, in remission: Secondary | ICD-10-CM | POA: Insufficient documentation

## 2016-09-21 DIAGNOSIS — E11622 Type 2 diabetes mellitus with other skin ulcer: Secondary | ICD-10-CM | POA: Diagnosis present

## 2016-09-24 ENCOUNTER — Encounter (HOSPITAL_BASED_OUTPATIENT_CLINIC_OR_DEPARTMENT_OTHER): Payer: Medicaid Other

## 2016-09-29 DIAGNOSIS — E1151 Type 2 diabetes mellitus with diabetic peripheral angiopathy without gangrene: Secondary | ICD-10-CM | POA: Insufficient documentation

## 2016-09-29 DIAGNOSIS — E11622 Type 2 diabetes mellitus with other skin ulcer: Secondary | ICD-10-CM | POA: Insufficient documentation

## 2016-09-29 DIAGNOSIS — T24231A Burn of second degree of right lower leg, initial encounter: Secondary | ICD-10-CM | POA: Insufficient documentation

## 2016-09-29 DIAGNOSIS — L97811 Non-pressure chronic ulcer of other part of right lower leg limited to breakdown of skin: Secondary | ICD-10-CM | POA: Insufficient documentation

## 2016-09-29 DIAGNOSIS — X16XXXA Contact with hot heating appliances, radiators and pipes, initial encounter: Secondary | ICD-10-CM | POA: Insufficient documentation

## 2016-09-29 DIAGNOSIS — I509 Heart failure, unspecified: Secondary | ICD-10-CM | POA: Diagnosis not present

## 2016-09-29 DIAGNOSIS — Z89512 Acquired absence of left leg below knee: Secondary | ICD-10-CM | POA: Insufficient documentation

## 2016-09-29 DIAGNOSIS — N189 Chronic kidney disease, unspecified: Secondary | ICD-10-CM | POA: Insufficient documentation

## 2016-09-29 DIAGNOSIS — I5042 Chronic combined systolic (congestive) and diastolic (congestive) heart failure: Secondary | ICD-10-CM | POA: Insufficient documentation

## 2016-09-29 DIAGNOSIS — I130 Hypertensive heart and chronic kidney disease with heart failure and stage 1 through stage 4 chronic kidney disease, or unspecified chronic kidney disease: Secondary | ICD-10-CM | POA: Insufficient documentation

## 2016-09-29 DIAGNOSIS — X58XXXA Exposure to other specified factors, initial encounter: Secondary | ICD-10-CM | POA: Diagnosis not present

## 2016-09-29 DIAGNOSIS — I959 Hypotension, unspecified: Secondary | ICD-10-CM | POA: Diagnosis not present

## 2016-09-29 DIAGNOSIS — Z87891 Personal history of nicotine dependence: Secondary | ICD-10-CM | POA: Insufficient documentation

## 2016-09-29 DIAGNOSIS — F1911 Other psychoactive substance abuse, in remission: Secondary | ICD-10-CM | POA: Insufficient documentation

## 2016-09-29 DIAGNOSIS — E1122 Type 2 diabetes mellitus with diabetic chronic kidney disease: Secondary | ICD-10-CM | POA: Insufficient documentation

## 2016-10-04 ENCOUNTER — Ambulatory Visit (INDEPENDENT_AMBULATORY_CARE_PROVIDER_SITE_OTHER): Payer: Medicare Other | Admitting: Orthopedic Surgery

## 2016-10-04 ENCOUNTER — Encounter (INDEPENDENT_AMBULATORY_CARE_PROVIDER_SITE_OTHER): Payer: Self-pay | Admitting: Orthopedic Surgery

## 2016-10-04 DIAGNOSIS — Z89512 Acquired absence of left leg below knee: Secondary | ICD-10-CM | POA: Insufficient documentation

## 2016-10-04 DIAGNOSIS — T24101A Burn of first degree of unspecified site of right lower limb, except ankle and foot, initial encounter: Secondary | ICD-10-CM | POA: Diagnosis not present
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
## 2016-10-05 ENCOUNTER — Telehealth: Payer: Self-pay | Admitting: *Deleted

## 2016-10-05 NOTE — Telephone Encounter (Signed)
Unable to reach patient at home (no answer or machine) or mobile (not accepting calls). Left message on his wife's mobile number letting them know that Jinny Blossom will be out sick on 10/06/16.  Provided our number to call back to be rescheduled.

## 2016-10-06 ENCOUNTER — Ambulatory Visit: Payer: Medicaid Other | Admitting: Adult Health

## 2016-10-12 ENCOUNTER — Encounter: Payer: Self-pay | Admitting: Adult Health

## 2016-10-12 ENCOUNTER — Ambulatory Visit (INDEPENDENT_AMBULATORY_CARE_PROVIDER_SITE_OTHER): Payer: Medicare Other | Admitting: Adult Health

## 2016-10-12 VITALS — BP 193/95 | HR 64 | Ht 74.0 in | Wt 213.6 lb

## 2016-10-12 DIAGNOSIS — R569 Unspecified convulsions: Secondary | ICD-10-CM | POA: Diagnosis not present

## 2016-10-12 NOTE — Patient Instructions (Signed)
Continue Vimpat If your symptoms worsen or you develop new symptoms please let us know.

## 2016-10-12 NOTE — Progress Notes (Signed)
I have read the note, and I agree with the clinical assessment and plan.  Jammie Clink,[place] KEITH   
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
## 2016-10-19 ENCOUNTER — Encounter (HOSPITAL_BASED_OUTPATIENT_CLINIC_OR_DEPARTMENT_OTHER): Payer: Medicare Other | Attending: Surgery

## 2016-10-19 DIAGNOSIS — T24231A Burn of second degree of right lower leg, initial encounter: Secondary | ICD-10-CM | POA: Insufficient documentation

## 2016-10-19 DIAGNOSIS — L97811 Non-pressure chronic ulcer of other part of right lower leg limited to breakdown of skin: Secondary | ICD-10-CM | POA: Diagnosis not present

## 2016-10-19 DIAGNOSIS — E1151 Type 2 diabetes mellitus with diabetic peripheral angiopathy without gangrene: Secondary | ICD-10-CM | POA: Diagnosis not present

## 2016-10-19 DIAGNOSIS — T24331A Burn of third degree of right lower leg, initial encounter: Secondary | ICD-10-CM | POA: Diagnosis not present

## 2016-10-19 DIAGNOSIS — Z89512 Acquired absence of left leg below knee: Secondary | ICD-10-CM | POA: Insufficient documentation

## 2016-10-19 DIAGNOSIS — E1122 Type 2 diabetes mellitus with diabetic chronic kidney disease: Secondary | ICD-10-CM | POA: Insufficient documentation

## 2016-10-19 DIAGNOSIS — I129 Hypertensive chronic kidney disease with stage 1 through stage 4 chronic kidney disease, or unspecified chronic kidney disease: Secondary | ICD-10-CM | POA: Insufficient documentation

## 2016-10-19 DIAGNOSIS — X58XXXA Exposure to other specified factors, initial encounter: Secondary | ICD-10-CM | POA: Diagnosis not present

## 2016-10-19 DIAGNOSIS — E11622 Type 2 diabetes mellitus with other skin ulcer: Secondary | ICD-10-CM | POA: Insufficient documentation

## 2016-10-19 DIAGNOSIS — N189 Chronic kidney disease, unspecified: Secondary | ICD-10-CM | POA: Insufficient documentation

## 2016-10-25 ENCOUNTER — Encounter (INDEPENDENT_AMBULATORY_CARE_PROVIDER_SITE_OTHER): Payer: Self-pay | Admitting: Orthopedic Surgery

## 2016-10-25 ENCOUNTER — Ambulatory Visit (INDEPENDENT_AMBULATORY_CARE_PROVIDER_SITE_OTHER): Payer: Medicare Other

## 2016-10-25 ENCOUNTER — Ambulatory Visit (INDEPENDENT_AMBULATORY_CARE_PROVIDER_SITE_OTHER): Payer: Medicare Other | Admitting: Orthopedic Surgery

## 2016-10-25 VITALS — Ht 74.0 in | Wt 213.0 lb

## 2016-10-25 DIAGNOSIS — M7541 Impingement syndrome of right shoulder: Secondary | ICD-10-CM | POA: Diagnosis not present

## 2016-10-25 DIAGNOSIS — L97919 Non-pressure chronic ulcer of unspecified part of right lower leg with unspecified severity: Secondary | ICD-10-CM

## 2016-10-25 DIAGNOSIS — I87331 Chronic venous hypertension (idiopathic) with ulcer and inflammation of right lower extremity: Secondary | ICD-10-CM

## 2016-10-25 DIAGNOSIS — Z89512 Acquired absence of left leg below knee: Secondary | ICD-10-CM | POA: Diagnosis not present

## 2016-10-25 MED ORDER — METHYLPREDNISOLONE ACETATE 40 MG/ML IJ SUSP
40.0000 mg | INTRAMUSCULAR | Status: AC | PRN
  Administered 2016-10-25: 40 mg via INTRA_ARTICULAR

## 2016-10-25 MED ORDER — LIDOCAINE HCL 1 % IJ SOLN
5.0000 mL | INTRAMUSCULAR | Status: AC | PRN
  Administered 2016-10-25: 5 mL
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
## 2016-10-26 DIAGNOSIS — T24231A Burn of second degree of right lower leg, initial encounter: Secondary | ICD-10-CM | POA: Diagnosis not present

## 2016-10-26 DIAGNOSIS — Z89512 Acquired absence of left leg below knee: Secondary | ICD-10-CM | POA: Diagnosis not present

## 2016-10-26 DIAGNOSIS — N189 Chronic kidney disease, unspecified: Secondary | ICD-10-CM | POA: Diagnosis not present

## 2016-10-26 DIAGNOSIS — E11622 Type 2 diabetes mellitus with other skin ulcer: Secondary | ICD-10-CM | POA: Diagnosis not present

## 2016-10-26 DIAGNOSIS — L97811 Non-pressure chronic ulcer of other part of right lower leg limited to breakdown of skin: Secondary | ICD-10-CM | POA: Diagnosis not present

## 2016-10-26 DIAGNOSIS — E1122 Type 2 diabetes mellitus with diabetic chronic kidney disease: Secondary | ICD-10-CM | POA: Diagnosis not present

## 2016-10-26 DIAGNOSIS — I129 Hypertensive chronic kidney disease with stage 1 through stage 4 chronic kidney disease, or unspecified chronic kidney disease: Secondary | ICD-10-CM | POA: Diagnosis not present

## 2016-10-26 DIAGNOSIS — E1151 Type 2 diabetes mellitus with diabetic peripheral angiopathy without gangrene: Secondary | ICD-10-CM | POA: Diagnosis not present

## 2016-10-26 DIAGNOSIS — T24331A Burn of third degree of right lower leg, initial encounter: Secondary | ICD-10-CM | POA: Diagnosis not present

## 2016-10-27 MED FILL — FUROSEMIDE 20 MG TABLET: 20 | 30 days supply | Qty: 30 | Fill #1

## 2016-10-27 MED FILL — BD PEN NDL MINI 31GX5MM: 31G X 5 MM | 25 days supply | Qty: 100 | Fill #2

## 2016-10-29 ENCOUNTER — Telehealth: Payer: Self-pay | Admitting: Family Medicine

## 2016-10-29 DIAGNOSIS — E118 Type 2 diabetes mellitus with unspecified complications: Secondary | ICD-10-CM

## 2016-10-29 MED ORDER — INSULIN GLARGINE 100 UNIT/ML SOLOSTAR PEN: 15.0000 [IU] | PEN_INJECTOR | Freq: Every day | SUBCUTANEOUS | 3 refills | Status: DC

## 2016-10-29 MED FILL — LANTUS SOLOSTAR 100 UNITS/M: 100 | 20 days supply | Qty: 3 | Fill #0

## 2016-10-29 NOTE — Telephone Encounter (Signed)
Insulin refilled.

## 2016-10-29 NOTE — Telephone Encounter (Signed)
Pt needs refills of insulin  Pt had pens refilled but not his insulin

## 2016-11-02 ENCOUNTER — Ambulatory Visit: Payer: Medicare Other | Attending: Family Medicine | Admitting: Family Medicine

## 2016-11-02 ENCOUNTER — Encounter (HOSPITAL_BASED_OUTPATIENT_CLINIC_OR_DEPARTMENT_OTHER): Payer: Medicare Other | Attending: Surgery

## 2016-11-02 ENCOUNTER — Encounter: Payer: Self-pay | Admitting: Family Medicine

## 2016-11-02 VITALS — BP 150/80 | HR 67 | Temp 98.2°F | Ht 74.0 in | Wt 206.0 lb

## 2016-11-02 DIAGNOSIS — Z794 Long term (current) use of insulin: Secondary | ICD-10-CM | POA: Diagnosis not present

## 2016-11-02 DIAGNOSIS — Z9081 Acquired absence of spleen: Secondary | ICD-10-CM | POA: Diagnosis not present

## 2016-11-02 DIAGNOSIS — L97811 Non-pressure chronic ulcer of other part of right lower leg limited to breakdown of skin: Secondary | ICD-10-CM | POA: Diagnosis not present

## 2016-11-02 DIAGNOSIS — F1911 Other psychoactive substance abuse, in remission: Secondary | ICD-10-CM | POA: Diagnosis not present

## 2016-11-02 DIAGNOSIS — I129 Hypertensive chronic kidney disease with stage 1 through stage 4 chronic kidney disease, or unspecified chronic kidney disease: Secondary | ICD-10-CM | POA: Insufficient documentation

## 2016-11-02 DIAGNOSIS — Z87891 Personal history of nicotine dependence: Secondary | ICD-10-CM | POA: Insufficient documentation

## 2016-11-02 DIAGNOSIS — Z833 Family history of diabetes mellitus: Secondary | ICD-10-CM | POA: Insufficient documentation

## 2016-11-02 DIAGNOSIS — N189 Chronic kidney disease, unspecified: Secondary | ICD-10-CM | POA: Diagnosis not present

## 2016-11-02 DIAGNOSIS — Z8619 Personal history of other infectious and parasitic diseases: Secondary | ICD-10-CM | POA: Insufficient documentation

## 2016-11-02 DIAGNOSIS — I10 Essential (primary) hypertension: Secondary | ICD-10-CM | POA: Diagnosis not present

## 2016-11-02 DIAGNOSIS — Z7982 Long term (current) use of aspirin: Secondary | ICD-10-CM | POA: Diagnosis not present

## 2016-11-02 DIAGNOSIS — D631 Anemia in chronic kidney disease: Secondary | ICD-10-CM | POA: Insufficient documentation

## 2016-11-02 DIAGNOSIS — R05 Cough: Secondary | ICD-10-CM | POA: Diagnosis not present

## 2016-11-02 DIAGNOSIS — E1100 Type 2 diabetes mellitus with hyperosmolarity without nonketotic hyperglycemic-hyperosmolar coma (NKHHC): Secondary | ICD-10-CM

## 2016-11-02 DIAGNOSIS — Z89422 Acquired absence of other left toe(s): Secondary | ICD-10-CM | POA: Insufficient documentation

## 2016-11-02 DIAGNOSIS — T24001A Burn of unspecified degree of unspecified site of right lower limb, except ankle and foot, initial encounter: Secondary | ICD-10-CM | POA: Diagnosis not present

## 2016-11-02 DIAGNOSIS — J069 Acute upper respiratory infection, unspecified: Secondary | ICD-10-CM | POA: Diagnosis not present

## 2016-11-02 DIAGNOSIS — E1151 Type 2 diabetes mellitus with diabetic peripheral angiopathy without gangrene: Secondary | ICD-10-CM | POA: Diagnosis not present

## 2016-11-02 DIAGNOSIS — Z809 Family history of malignant neoplasm, unspecified: Secondary | ICD-10-CM | POA: Insufficient documentation

## 2016-11-02 DIAGNOSIS — E11622 Type 2 diabetes mellitus with other skin ulcer: Secondary | ICD-10-CM | POA: Diagnosis not present

## 2016-11-02 DIAGNOSIS — B9789 Other viral agents as the cause of diseases classified elsewhere: Secondary | ICD-10-CM | POA: Diagnosis not present

## 2016-11-02 DIAGNOSIS — E1122 Type 2 diabetes mellitus with diabetic chronic kidney disease: Secondary | ICD-10-CM | POA: Insufficient documentation

## 2016-11-02 DIAGNOSIS — R569 Unspecified convulsions: Secondary | ICD-10-CM | POA: Diagnosis not present

## 2016-11-02 DIAGNOSIS — E118 Type 2 diabetes mellitus with unspecified complications: Secondary | ICD-10-CM

## 2016-11-02 DIAGNOSIS — G546 Phantom limb syndrome with pain: Secondary | ICD-10-CM | POA: Diagnosis not present

## 2016-11-02 DIAGNOSIS — N183 Chronic kidney disease, stage 3 (moderate): Secondary | ICD-10-CM | POA: Diagnosis not present

## 2016-11-02 DIAGNOSIS — I130 Hypertensive heart and chronic kidney disease with heart failure and stage 1 through stage 4 chronic kidney disease, or unspecified chronic kidney disease: Secondary | ICD-10-CM | POA: Diagnosis not present

## 2016-11-02 DIAGNOSIS — Z89512 Acquired absence of left leg below knee: Secondary | ICD-10-CM | POA: Diagnosis not present

## 2016-11-02 DIAGNOSIS — I509 Heart failure, unspecified: Secondary | ICD-10-CM | POA: Insufficient documentation

## 2016-11-02 DIAGNOSIS — E1165 Type 2 diabetes mellitus with hyperglycemia: Secondary | ICD-10-CM | POA: Diagnosis not present

## 2016-11-02 LAB — GLUCOSE, POCT (MANUAL RESULT ENTRY): POC Glucose: 275 mg/dl — AB (ref 70–99)

## 2016-11-02 LAB — POCT GLYCOSYLATED HEMOGLOBIN (HGB A1C): Hemoglobin A1C: 7.3

## 2016-11-02 MED ORDER — DM-GUAIFENESIN ER 30-600 MG PO TB12: 1.0000 | ORAL_TABLET | Freq: Two times a day (BID) | ORAL | 0 refills | Status: DC

## 2016-11-02 MED ORDER — INSULIN GLARGINE 100 UNIT/ML SOLOSTAR PEN: 18.0000 [IU] | PEN_INJECTOR | Freq: Every day | SUBCUTANEOUS | 3 refills | Status: DC

## 2016-11-02 MED ORDER — AMLODIPINE BESYLATE 10 MG PO TABS: 10.0000 mg | ORAL_TABLET | Freq: Every day | ORAL | 3 refills | Status: DC
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
## 2016-11-02 NOTE — Patient Instructions (Signed)

## 2016-11-03 ENCOUNTER — Ambulatory Visit (INDEPENDENT_AMBULATORY_CARE_PROVIDER_SITE_OTHER): Payer: Medicare Other | Admitting: Podiatry

## 2016-11-03 ENCOUNTER — Encounter: Payer: Self-pay | Admitting: Podiatry

## 2016-11-03 VITALS — BP 138/77 | HR 71 | Resp 18

## 2016-11-03 DIAGNOSIS — E1142 Type 2 diabetes mellitus with diabetic polyneuropathy: Secondary | ICD-10-CM | POA: Diagnosis not present

## 2016-11-03 DIAGNOSIS — B351 Tinea unguium: Secondary | ICD-10-CM | POA: Diagnosis not present

## 2016-11-03 DIAGNOSIS — M79674 Pain in right toe(s): Secondary | ICD-10-CM

## 2016-11-03 NOTE — Patient Instructions (Signed)

## 2016-11-03 NOTE — Progress Notes (Signed)
Patient ID: Mark [name], male   DOB: 01-21-1951, 66 y.o.   MRN: [id number]    Subjective: This diabetic patient presents today for a scheduled visit for debridement of toenails on the right foot Is a diabetic with a history of BK amputation left and 2014  Objective:  Orientated 3  Vascular: DP and PT right 2/4 Capillary reflex right immediate  Neurological: Sensation to 10 g monofilament wire intact 3/5 right Vibratory sensation nonreactive right Ankle reflexe reactive right  Dermatological: No open skin lesions on the right Toenails 1-5 are extremely hypertrophic, elongated, deformed and tender to direct palpation The right hallux is low-grade edema on the dorsal aspect without any open lesions, warmth or drainage  Musculoskeletal: BK amputation left HAV right Hammertoe 2-4 right  Assessment: Adequate vascular status right 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

## 2016-11-04 LAB — INFLUENZA VIRUS AG, A+B (DFA)

## 2016-11-05 ENCOUNTER — Other Ambulatory Visit: Payer: Self-pay | Admitting: Family Medicine

## 2016-11-05 LAB — CULTURE, UPPER RESPIRATORY

## 2016-11-05 MED ORDER — AMOXICILLIN 500 MG PO CAPS: 500.0000 mg | ORAL_CAPSULE | Freq: Three times a day (TID) | ORAL | 0 refills | Status: DC

## 2016-11-05 NOTE — Progress Notes (Signed)
aoxicil

## 2016-11-09 ENCOUNTER — Telehealth: Payer: Self-pay

## 2016-11-09 NOTE — Telephone Encounter (Signed)
-----   Message from Arnoldo Morale, MD sent at 11/05/2016  1:41 PM EST ----- He tested negative for influenza A and B however cultures are positive for another bacteria for which I have sent an antibiotic to his pharmacy.

## 2016-11-09 NOTE — Telephone Encounter (Signed)
Writer spoke with patient who picked up the antibiotic and has been on it for several days.  Patient states he continues to cough however is feeling much better. Writer reminded him to keep his appt with Dr. Adrian Blackwater next Tuesday 11/16/16.  Patient states understanding.

## 2016-11-12 ENCOUNTER — Telehealth (INDEPENDENT_AMBULATORY_CARE_PROVIDER_SITE_OTHER): Payer: Self-pay | Admitting: Orthopedic Surgery

## 2016-11-15 ENCOUNTER — Other Ambulatory Visit: Payer: Self-pay | Admitting: Family Medicine

## 2016-11-15 DIAGNOSIS — R569 Unspecified convulsions: Secondary | ICD-10-CM

## 2016-11-16 ENCOUNTER — Ambulatory Visit: Payer: Medicare Other | Attending: Family Medicine | Admitting: Family Medicine

## 2016-11-16 ENCOUNTER — Encounter: Payer: Self-pay | Admitting: Family Medicine

## 2016-11-16 ENCOUNTER — Telehealth: Payer: Self-pay | Admitting: *Deleted

## 2016-11-16 VITALS — BP 137/82 | HR 67 | Temp 98.1°F | Ht 74.0 in | Wt 217.0 lb

## 2016-11-16 DIAGNOSIS — E1122 Type 2 diabetes mellitus with diabetic chronic kidney disease: Secondary | ICD-10-CM | POA: Diagnosis not present

## 2016-11-16 DIAGNOSIS — Z794 Long term (current) use of insulin: Secondary | ICD-10-CM | POA: Insufficient documentation

## 2016-11-16 DIAGNOSIS — Z0000 Encounter for general adult medical examination without abnormal findings: Secondary | ICD-10-CM | POA: Diagnosis not present

## 2016-11-16 DIAGNOSIS — E1151 Type 2 diabetes mellitus with diabetic peripheral angiopathy without gangrene: Secondary | ICD-10-CM | POA: Diagnosis not present

## 2016-11-16 DIAGNOSIS — I130 Hypertensive heart and chronic kidney disease with heart failure and stage 1 through stage 4 chronic kidney disease, or unspecified chronic kidney disease: Secondary | ICD-10-CM | POA: Diagnosis not present

## 2016-11-16 DIAGNOSIS — L97811 Non-pressure chronic ulcer of other part of right lower leg limited to breakdown of skin: Secondary | ICD-10-CM | POA: Diagnosis not present

## 2016-11-16 DIAGNOSIS — Z7982 Long term (current) use of aspirin: Secondary | ICD-10-CM | POA: Diagnosis not present

## 2016-11-16 DIAGNOSIS — I129 Hypertensive chronic kidney disease with stage 1 through stage 4 chronic kidney disease, or unspecified chronic kidney disease: Secondary | ICD-10-CM | POA: Diagnosis not present

## 2016-11-16 DIAGNOSIS — E1100 Type 2 diabetes mellitus with hyperosmolarity without nonketotic hyperglycemic-hyperosmolar coma (NKHHC): Secondary | ICD-10-CM

## 2016-11-16 DIAGNOSIS — D509 Iron deficiency anemia, unspecified: Secondary | ICD-10-CM | POA: Diagnosis not present

## 2016-11-16 DIAGNOSIS — E11622 Type 2 diabetes mellitus with other skin ulcer: Secondary | ICD-10-CM | POA: Diagnosis not present

## 2016-11-16 DIAGNOSIS — N183 Chronic kidney disease, stage 3 unspecified: Secondary | ICD-10-CM

## 2016-11-16 DIAGNOSIS — Z87891 Personal history of nicotine dependence: Secondary | ICD-10-CM | POA: Insufficient documentation

## 2016-11-16 DIAGNOSIS — I10 Essential (primary) hypertension: Secondary | ICD-10-CM

## 2016-11-16 DIAGNOSIS — I5042 Chronic combined systolic (congestive) and diastolic (congestive) heart failure: Secondary | ICD-10-CM

## 2016-11-16 DIAGNOSIS — Z89512 Acquired absence of left leg below knee: Secondary | ICD-10-CM | POA: Diagnosis not present

## 2016-11-16 DIAGNOSIS — T24331A Burn of third degree of right lower leg, initial encounter: Secondary | ICD-10-CM | POA: Diagnosis not present

## 2016-11-16 DIAGNOSIS — N189 Chronic kidney disease, unspecified: Secondary | ICD-10-CM | POA: Diagnosis not present

## 2016-11-16 LAB — BASIC METABOLIC PANEL WITH GFR
BUN: 49 mg/dL — ABNORMAL HIGH (ref 7–25)
CO2: 20 mmol/L (ref 20–31)
Calcium: 8.9 mg/dL (ref 8.6–10.3)
Chloride: 113 mmol/L — ABNORMAL HIGH (ref 98–110)
Creat: 2.16 mg/dL — ABNORMAL HIGH (ref 0.70–1.25)
GFR, Est African American: 36 mL/min — ABNORMAL LOW (ref >=60)
GFR, Est Non African American: 31 mL/min — ABNORMAL LOW (ref >=60)
Glucose, Bld: 237 mg/dL — ABNORMAL HIGH (ref 65–99)
Potassium: 5.1 mmol/L (ref 3.5–5.3)
Sodium: 141 mmol/L (ref 135–146)

## 2016-11-16 LAB — GLUCOSE, POCT (MANUAL RESULT ENTRY): POC Glucose: 192 mg/dl — AB (ref 70–99)

## 2016-11-16 MED ORDER — CARVEDILOL 12.5 MG PO TABS
12.5000 mg | ORAL_TABLET | Freq: Two times a day (BID) | ORAL | 3 refills | Status: DC
Start: 2016-11-16 — End: 2017-05-04

## 2016-11-16 MED ORDER — FERROUS GLUCONATE 324 (38 FE) MG PO TABS: 324.0000 mg | ORAL_TABLET | Freq: Two times a day (BID) | ORAL | 5 refills | Status: DC

## 2016-11-16 MED ORDER — ATORVASTATIN CALCIUM 20 MG PO TABS: 20.0000 mg | ORAL_TABLET | Freq: Every day | ORAL | 3 refills | Status: DC

## 2016-11-16 MED ORDER — AMLODIPINE BESYLATE 10 MG PO TABS: 10.0000 mg | ORAL_TABLET | Freq: Every day | ORAL | 3 refills | Status: DC

## 2016-11-16 MED ORDER — FUROSEMIDE 20 MG PO TABS: 20.0000 mg | ORAL_TABLET | Freq: Every day | ORAL | 3 refills | Status: DC

## 2016-11-16 MED ORDER — INSULIN GLARGINE 100 UNIT/ML SOLOSTAR PEN: 20.0000 [IU] | PEN_INJECTOR | Freq: Every day | SUBCUTANEOUS | 3 refills | Status: DC

## 2016-11-16 MED FILL — LANTUS SOLOSTAR 100 UNITS/M: 100 | 30 days supply | Qty: 6 | Fill #0

## 2016-11-16 MED FILL — ATORVASTATIN 20 MG TABLET: 20 | 30 days supply | Qty: 30 | Fill #0

## 2016-11-16 MED FILL — CARVEDILOL 12.5 MG TABLET: 12.5 | 30 days supply | Qty: 60 | Fill #0

## 2016-11-16 NOTE — Telephone Encounter (Signed)
Fax confirmation received for vimpat to [phone].

## 2016-11-16 NOTE — Assessment & Plan Note (Signed)
A: well controlled with A1c near goal is < 7 P: Increase lantus from 18 to 20 U daily Referral to ophthalmology

## 2016-11-16 NOTE — Patient Instructions (Addendum)
Mark [name] was seen today for diabetes.  Diagnoses and all orders for this visit:  Type 2 diabetes mellitus with hyperosmolarity without coma, without long-term current use of insulin (HCC) -     POCT glucose (manual entry) -     Microalbumin/Creatinine Ratio, Urine -     Insulin Glargine (LANTUS SOLOSTAR) 100 UNIT/ML Solostar Pen; Inject 20 Units into the skin daily at 10 pm. -     Ambulatory referral to Ophthalmology  Healthcare maintenance -     Ambulatory referral to Gastroenterology  Essential hypertension -     amLODipine (NORVASC) 10 MG tablet; Take 1 tablet (10 mg total) by mouth daily. -     carvedilol (COREG) 12.5 MG tablet; Take 1 tablet (12.5 mg total) by mouth 2 (two) times daily with a meal. -     furosemide (LASIX) 20 MG tablet; Take 1 tablet (20 mg total) by mouth daily.  Chronic combined systolic and diastolic congestive heart failure (HCC) -     atorvastatin (LIPITOR) 20 MG tablet; Take 1 tablet (20 mg total) by mouth daily. -     carvedilol (COREG) 12.5 MG tablet; Take 1 tablet (12.5 mg total) by mouth 2 (two) times daily with a meal. -     furosemide (LASIX) 20 MG tablet; Take 1 tablet (20 mg total) by mouth daily.  CKD (chronic kidney disease) stage 3, GFR 30-59 ml/min -     BASIC METABOLIC PANEL WITH GFR   f/u in 3 months for diabetes   Dr. Adrian Blackwater

## 2016-11-16 NOTE — Progress Notes (Signed)
Subjective:  Patient ID: Mark [name], male    DOB: Dec 22, 1950  Age: 66 y.o. MRN: [id number]  CC: Diabetes   HPI Mark [name] has CKD stage 3, HTN, diabetes, combined diastolic and systolic CHF he  presents for   1. 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 Smoking status: Former Smoker    Quit date: 08/03/2013  . Smokeless tobacco: Never Used  . Alcohol use No   Outpatient Medications Prior to Visit  Medication Sig Dispense Refill  . amLODipine (NORVASC) 10 MG tablet Take 1 tablet (10 mg total) by mouth daily. 30 tablet 3  . aspirin 81 MG EC tablet Take 1 tablet (81 mg total) by mouth daily. 90 tablet 3  . atorvastatin (LIPITOR) 20 MG tablet Take 1 tablet (20 mg total) by mouth daily. 90 tablet 3  . B-D UF III MINI PEN NEEDLES 31G X 5 MM MISC USE TO INJECT INSULIN AS DIRECTED BY PHYSICIAN 100 each 12  . Blood Glucose Monitoring Suppl (ACCU-CHEK AVIVA PLUS) W/DEVICE KIT Use as prescribed TID before meals and QHS 1 kit 0  . carvedilol (COREG) 12.5 MG tablet Take 1 tablet (12.5 mg total) by mouth 2 (two) times daily with a meal. 180 tablet 3  . dextromethorphan-guaiFENesin (MUCINEX DM) 30-600 MG 12hr tablet Take 1 tablet by mouth 2 (two) times daily. 20 tablet 0  . furosemide (LASIX) 20 MG tablet Take 1 tablet (20 mg total) by mouth daily. 30 tablet 2  . glucose blood (ACCU-CHEK AVIVA) test strip Use as instructed 100 each 12  . Insulin Glargine (LANTUS SOLOSTAR) 100 UNIT/ML Solostar Pen Inject 18 Units into the skin daily at 10 pm. 10 pen 3  . Insulin Syringe-Needle U-100 (INSULIN  SYRINGE .5CC/30GX5/16") 30G X 5/16" 0.5 ML MISC Check blood sugar TID & QHS 100 each 2  . lacosamide (VIMPAT) 200 MG TABS tablet Take 1 tablet (200 mg total) by mouth 2 (two) times daily. 60 tablet 2  . silver sulfADIAZINE (SILVADENE) 1 % cream Apply 1 application topically 2 (two) times daily. 50 g 0  . acetaminophen (TYLENOL) 500 MG tablet Take 1 tablet (500 mg total) by mouth every 6 (six) hours as needed. (Patient not taking: Reported on 11/02/2016) 30 tablet 0  . amoxicillin (AMOXIL) 500 MG capsule Take 1 capsule (500 mg total) by mouth 3 (three) times daily. (Patient not taking: Reported on 11/16/2016) 30 capsule 0  . ferrous gluconate (FERGON) 324 MG tablet Take 1 tablet (324 mg total) by mouth 2 (two) times daily with a meal. (Patient not taking: Reported on 11/02/2016) 60 tablet 2   No facility-administered medications prior to visit.     ROS Review of Systems  Constitutional: Negative for chills, fatigue, fever and unexpected weight change.  Eyes: Positive for visual disturbance.  Respiratory: Negative for cough and shortness of breath.   Cardiovascular: Negative for chest pain, palpitations and leg swelling.  Gastrointestinal: Negative for abdominal pain, blood in stool, constipation, diarrhea, nausea and vomiting.  Endocrine: Negative for polydipsia, polyphagia and  polyuria.  Musculoskeletal: Negative for arthralgias, back pain, gait problem, myalgias and neck pain.  Skin: Negative for rash.  Allergic/Immunologic: Negative for immunocompromised state.  Hematological: Negative for adenopathy. Does not bruise/bleed easily.  Psychiatric/Behavioral: Negative for dysphoric mood, sleep disturbance and suicidal ideas. The patient is not nervous/anxious.     Objective:  BP 137/82   Pulse 67   Temp 98.1 F (36.7 C) (Oral)   Ht 6' 2"  (1.88 m)   Wt 217 lb (98.4 kg)   SpO2 99%   BMI 27.86 kg/m   BP/Weight 11/16/2016 04/02/4195 10/01/2977  Systolic BP 892 119 417  Diastolic BP 82 77 80    Wt. (Lbs) 217 - 206  BMI 27.86 - 26.45  Some encounter information is confidential and restricted. Go to Review Flowsheets activity to see all data.   Physical Exam  Constitutional: He appears well-developed and well-nourished. No distress.  HENT:  Head: Normocephalic and atraumatic.  Neck: Normal range of motion. Neck supple.  Cardiovascular: Normal rate, regular rhythm, normal heart sounds and intact distal pulses.   Pulmonary/Chest: Effort normal and breath sounds normal.  Musculoskeletal: He exhibits no edema.  Neurological: He is alert.  Skin: Skin is warm and dry. No rash noted. No erythema.  Psychiatric: He has a normal mood and affect.    Lab Results  Component Value Date   HGBA1C 7.3 11/02/2016   CBG 192  Lab Results  Component Value Date   WBC 8.3 07/15/2016   HGB 8.1 (L) 07/15/2016   HCT 24.9 (L) 07/15/2016   MCV 89.6 07/15/2016   PLT 332 07/15/2016     Chemistry      Component Value Date/Time   NA 141 07/15/2016 0658   NA 141 01/14/2016   K 3.6 07/15/2016 0658   CL 118 (H) 07/15/2016 0658   CO2 18 (L) 07/15/2016 0658   BUN 31 (H) 07/15/2016 0658   BUN 23 (A) 01/14/2016   CREATININE 1.71 (H) 07/15/2016 0658   CREATININE 1.88 (H) 04/26/2016 0915   GLU 175 01/14/2016      Component Value Date/Time   CALCIUM 8.6 (L) 07/15/2016 0658   ALKPHOS 73 06/23/2016 1354   AST 20 06/23/2016 1354   ALT 28 06/23/2016 1354   BILITOT 0.5 06/23/2016 1354      Assessment & Plan:   Mark [name] was seen today for diabetes.  Diagnoses and all orders for this visit:  Type 2 diabetes mellitus with hyperosmolarity without coma, without long-term current use of insulin (HCC) -     POCT glucose (manual entry) -     Microalbumin/Creatinine Ratio, Urine -     Insulin Glargine (LANTUS SOLOSTAR) 100 UNIT/ML Solostar Pen; Inject 20 Units into the skin daily at 10 pm. -     Ambulatory referral to Ophthalmology  Healthcare maintenance -     Ambulatory referral to  Gastroenterology  Essential hypertension -     amLODipine (NORVASC) 10 MG tablet; Take 1 tablet (10 mg total) by mouth daily. -     carvedilol (COREG) 12.5 MG tablet; Take 1 tablet (12.5 mg total) by mouth 2 (two) times daily with a meal. -     furosemide (LASIX) 20 MG tablet; Take 1 tablet (20 mg total) by mouth daily.  Chronic combined systolic and diastolic congestive heart failure (HCC) -     atorvastatin (LIPITOR) 20 MG tablet; Take 1 tablet (20 mg total) by mouth daily. -     carvedilol (COREG) 12.5 MG tablet; Take 1 tablet (  12.5 mg total) by mouth 2 (two) times daily with a meal. -     furosemide (LASIX) 20 MG tablet; Take 1 tablet (20 mg total) by mouth daily.  CKD (chronic kidney disease) stage 3, GFR 30-59 ml/min -     BASIC METABOLIC PANEL WITH GFR  Iron deficiency anemia, unspecified iron deficiency anemia type -     ferrous gluconate (FERGON) 324 MG tablet; Take 1 tablet (324 mg total) by mouth 2 (two) times daily with a meal.    No orders of the defined types were placed in this encounter.   Follow-up: Return in about 3 months (around 02/16/2017) for diabetes .   Boykin Nearing MD

## 2016-11-16 NOTE — Progress Notes (Signed)
Pt is here for diabetes. Pt needs refills on diabetes supplies and medication.

## 2016-11-17 ENCOUNTER — Other Ambulatory Visit: Payer: Self-pay | Admitting: Family Medicine

## 2016-11-17 DIAGNOSIS — R569 Unspecified convulsions: Secondary | ICD-10-CM

## 2016-11-17 MED FILL — ACCU-CHEK SOFTCLIX LANCETS: 30 days supply | Qty: 100 | Fill #2

## 2016-11-18 LAB — MICROALBUMIN / CREATININE URINE RATIO
Creatinine, Urine: 54 mg/dL (ref 20–370)
Microalb Creat Ratio: 1881 mcg/mg creat — ABNORMAL HIGH (ref <30)
Microalb, Ur: 101.6 mg/dL

## 2016-11-22 ENCOUNTER — Ambulatory Visit (INDEPENDENT_AMBULATORY_CARE_PROVIDER_SITE_OTHER): Payer: Medicare Other | Admitting: Orthopedic Surgery

## 2016-11-22 ENCOUNTER — Ambulatory Visit (INDEPENDENT_AMBULATORY_CARE_PROVIDER_SITE_OTHER): Payer: Medicare Other

## 2016-11-22 ENCOUNTER — Encounter (INDEPENDENT_AMBULATORY_CARE_PROVIDER_SITE_OTHER): Payer: Self-pay | Admitting: Orthopedic Surgery

## 2016-11-22 VITALS — Ht 74.0 in | Wt 217.0 lb

## 2016-11-22 DIAGNOSIS — S46811A Strain of other muscles, fascia and tendons at shoulder and upper arm level, right arm, initial encounter: Secondary | ICD-10-CM | POA: Diagnosis not present

## 2016-11-22 DIAGNOSIS — M542 Cervicalgia: Secondary | ICD-10-CM

## 2016-11-22 MED ORDER — NAPROXEN 500 MG PO TABS: 500.0000 mg | ORAL_TABLET | Freq: Two times a day (BID) | ORAL | 0 refills | Status: DC | PRN

## 2016-11-22 MED ORDER — METHOCARBAMOL 500 MG PO TABS: 500.0000 mg | ORAL_TABLET | Freq: Four times a day (QID) | ORAL | 0 refills | Status: DC
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
## 2016-11-23 DIAGNOSIS — E1122 Type 2 diabetes mellitus with diabetic chronic kidney disease: Secondary | ICD-10-CM | POA: Diagnosis not present

## 2016-11-23 DIAGNOSIS — E1151 Type 2 diabetes mellitus with diabetic peripheral angiopathy without gangrene: Secondary | ICD-10-CM | POA: Diagnosis not present

## 2016-11-23 DIAGNOSIS — N189 Chronic kidney disease, unspecified: Secondary | ICD-10-CM | POA: Diagnosis not present

## 2016-11-23 DIAGNOSIS — T24231A Burn of second degree of right lower leg, initial encounter: Secondary | ICD-10-CM | POA: Diagnosis not present

## 2016-11-23 DIAGNOSIS — E11622 Type 2 diabetes mellitus with other skin ulcer: Secondary | ICD-10-CM | POA: Diagnosis not present

## 2016-11-23 DIAGNOSIS — Z89512 Acquired absence of left leg below knee: Secondary | ICD-10-CM | POA: Diagnosis not present

## 2016-11-23 DIAGNOSIS — I129 Hypertensive chronic kidney disease with stage 1 through stage 4 chronic kidney disease, or unspecified chronic kidney disease: Secondary | ICD-10-CM | POA: Diagnosis not present

## 2016-11-23 DIAGNOSIS — L97811 Non-pressure chronic ulcer of other part of right lower leg limited to breakdown of skin: Secondary | ICD-10-CM | POA: Diagnosis not present

## 2016-11-30 ENCOUNTER — Encounter (HOSPITAL_BASED_OUTPATIENT_CLINIC_OR_DEPARTMENT_OTHER): Payer: Medicare Other | Attending: Surgery

## 2016-11-30 DIAGNOSIS — L97212 Non-pressure chronic ulcer of right calf with fat layer exposed: Secondary | ICD-10-CM | POA: Insufficient documentation

## 2016-11-30 DIAGNOSIS — E1151 Type 2 diabetes mellitus with diabetic peripheral angiopathy without gangrene: Secondary | ICD-10-CM | POA: Insufficient documentation

## 2016-11-30 DIAGNOSIS — E1122 Type 2 diabetes mellitus with diabetic chronic kidney disease: Secondary | ICD-10-CM | POA: Insufficient documentation

## 2016-11-30 DIAGNOSIS — Z89512 Acquired absence of left leg below knee: Secondary | ICD-10-CM | POA: Insufficient documentation

## 2016-11-30 DIAGNOSIS — I129 Hypertensive chronic kidney disease with stage 1 through stage 4 chronic kidney disease, or unspecified chronic kidney disease: Secondary | ICD-10-CM | POA: Insufficient documentation

## 2016-11-30 DIAGNOSIS — N189 Chronic kidney disease, unspecified: Secondary | ICD-10-CM | POA: Insufficient documentation

## 2016-11-30 DIAGNOSIS — F1411 Cocaine abuse, in remission: Secondary | ICD-10-CM | POA: Insufficient documentation

## 2016-11-30 DIAGNOSIS — Z87891 Personal history of nicotine dependence: Secondary | ICD-10-CM | POA: Insufficient documentation

## 2016-11-30 DIAGNOSIS — E11622 Type 2 diabetes mellitus with other skin ulcer: Secondary | ICD-10-CM | POA: Insufficient documentation

## 2016-12-01 DIAGNOSIS — F1411 Cocaine abuse, in remission: Secondary | ICD-10-CM | POA: Diagnosis not present

## 2016-12-01 DIAGNOSIS — E1151 Type 2 diabetes mellitus with diabetic peripheral angiopathy without gangrene: Secondary | ICD-10-CM | POA: Diagnosis not present

## 2016-12-01 DIAGNOSIS — T24231A Burn of second degree of right lower leg, initial encounter: Secondary | ICD-10-CM | POA: Diagnosis not present

## 2016-12-01 DIAGNOSIS — I129 Hypertensive chronic kidney disease with stage 1 through stage 4 chronic kidney disease, or unspecified chronic kidney disease: Secondary | ICD-10-CM | POA: Diagnosis not present

## 2016-12-01 DIAGNOSIS — E11622 Type 2 diabetes mellitus with other skin ulcer: Secondary | ICD-10-CM | POA: Diagnosis not present

## 2016-12-01 DIAGNOSIS — E1122 Type 2 diabetes mellitus with diabetic chronic kidney disease: Secondary | ICD-10-CM | POA: Diagnosis not present

## 2016-12-01 DIAGNOSIS — L97212 Non-pressure chronic ulcer of right calf with fat layer exposed: Secondary | ICD-10-CM | POA: Diagnosis not present

## 2016-12-01 DIAGNOSIS — N189 Chronic kidney disease, unspecified: Secondary | ICD-10-CM | POA: Diagnosis not present

## 2016-12-01 DIAGNOSIS — Z89512 Acquired absence of left leg below knee: Secondary | ICD-10-CM | POA: Diagnosis not present

## 2016-12-01 DIAGNOSIS — Z87891 Personal history of nicotine dependence: Secondary | ICD-10-CM | POA: Diagnosis not present

## 2016-12-02 ENCOUNTER — Other Ambulatory Visit: Payer: Self-pay | Admitting: Family Medicine

## 2016-12-02 ENCOUNTER — Telehealth: Payer: Self-pay | Admitting: Family Medicine

## 2016-12-02 DIAGNOSIS — R569 Unspecified convulsions: Secondary | ICD-10-CM

## 2016-12-02 MED FILL — FUROSEMIDE 20 MG TABLET: 20 | 30 days supply | Qty: 30 | Fill #0

## 2016-12-02 MED FILL — ACCU-CHEK AVIVA PLUS TEST S: 25 days supply | Qty: 100 | Fill #0

## 2016-12-02 NOTE — Telephone Encounter (Signed)
Pt was called but there is no VM set up to leave a message. Pt still has 5 refills on medication.

## 2016-12-02 NOTE — Telephone Encounter (Signed)
Patient requesting Rx for VIMPAT 200 MG TABS tablet  To be faxed over to [facility].  I informed pt that a Rx had been placed by Ward Givens NP but patient denied that this has been done. Requesting for Rx to come from PCP

## 2016-12-06 ENCOUNTER — Other Ambulatory Visit: Payer: Self-pay | Admitting: Family Medicine

## 2016-12-06 DIAGNOSIS — R569 Unspecified convulsions: Secondary | ICD-10-CM

## 2016-12-09 ENCOUNTER — Telehealth: Payer: Self-pay | Admitting: Adult Health

## 2016-12-09 NOTE — Telephone Encounter (Addendum)
Vimpat Rx successfully faxed to [facility] [address] , and spoke with patient to inform. He verbalized understanding, appreciation.

## 2016-12-09 NOTE — Telephone Encounter (Signed)
Ciera from [facility] stating pt needs a refill of his VIMPAT 200 MG TABS tablet sent to [facility] ([facility] [place] [facility] [place]  because they are unable to fill it.  If there are questions for her she can be reached at [phone].  She said the pt can be reached on his mobile # to notify that the Rx will be sent to the [facility]

## 2016-12-14 DIAGNOSIS — N189 Chronic kidney disease, unspecified: Secondary | ICD-10-CM | POA: Diagnosis not present

## 2016-12-14 DIAGNOSIS — I129 Hypertensive chronic kidney disease with stage 1 through stage 4 chronic kidney disease, or unspecified chronic kidney disease: Secondary | ICD-10-CM | POA: Diagnosis not present

## 2016-12-14 DIAGNOSIS — Z89512 Acquired absence of left leg below knee: Secondary | ICD-10-CM | POA: Diagnosis not present

## 2016-12-14 DIAGNOSIS — E1151 Type 2 diabetes mellitus with diabetic peripheral angiopathy without gangrene: Secondary | ICD-10-CM | POA: Diagnosis not present

## 2016-12-14 DIAGNOSIS — E1122 Type 2 diabetes mellitus with diabetic chronic kidney disease: Secondary | ICD-10-CM | POA: Diagnosis not present

## 2016-12-14 DIAGNOSIS — L97212 Non-pressure chronic ulcer of right calf with fat layer exposed: Secondary | ICD-10-CM | POA: Diagnosis not present

## 2016-12-14 DIAGNOSIS — T24231D Burn of second degree of right lower leg, subsequent encounter: Secondary | ICD-10-CM | POA: Diagnosis not present

## 2016-12-14 DIAGNOSIS — E11622 Type 2 diabetes mellitus with other skin ulcer: Secondary | ICD-10-CM | POA: Diagnosis not present

## 2016-12-14 DIAGNOSIS — Z87891 Personal history of nicotine dependence: Secondary | ICD-10-CM | POA: Diagnosis not present

## 2016-12-15 NOTE — Telephone Encounter (Signed)
error 

## 2016-12-16 DIAGNOSIS — K5730 Diverticulosis of large intestine without perforation or abscess without bleeding: Secondary | ICD-10-CM | POA: Diagnosis not present

## 2016-12-16 DIAGNOSIS — Z86010 Personal history of colonic polyps: Secondary | ICD-10-CM | POA: Diagnosis not present

## 2016-12-16 DIAGNOSIS — D125 Benign neoplasm of sigmoid colon: Secondary | ICD-10-CM | POA: Diagnosis not present

## 2016-12-16 DIAGNOSIS — K648 Other hemorrhoids: Secondary | ICD-10-CM | POA: Diagnosis not present

## 2016-12-16 DIAGNOSIS — K6389 Other specified diseases of intestine: Secondary | ICD-10-CM | POA: Diagnosis not present

## 2016-12-16 DIAGNOSIS — D124 Benign neoplasm of descending colon: Secondary | ICD-10-CM | POA: Diagnosis not present

## 2016-12-20 ENCOUNTER — Ambulatory Visit (INDEPENDENT_AMBULATORY_CARE_PROVIDER_SITE_OTHER): Payer: Medicare Other | Admitting: Orthopedic Surgery

## 2016-12-23 DIAGNOSIS — K6389 Other specified diseases of intestine: Secondary | ICD-10-CM | POA: Diagnosis not present

## 2016-12-23 DIAGNOSIS — D124 Benign neoplasm of descending colon: Secondary | ICD-10-CM | POA: Diagnosis not present

## 2016-12-23 DIAGNOSIS — Z86010 Personal history of colonic polyps: Secondary | ICD-10-CM | POA: Diagnosis not present

## 2016-12-23 DIAGNOSIS — K648 Other hemorrhoids: Secondary | ICD-10-CM | POA: Diagnosis not present

## 2016-12-23 DIAGNOSIS — D125 Benign neoplasm of sigmoid colon: Secondary | ICD-10-CM | POA: Diagnosis not present

## 2016-12-31 ENCOUNTER — Encounter (HOSPITAL_COMMUNITY): Payer: Self-pay | Admitting: Emergency Medicine

## 2016-12-31 ENCOUNTER — Inpatient Hospital Stay (HOSPITAL_COMMUNITY)
Admission: EM | Admit: 2016-12-31 | Discharge: 2017-01-05 | DRG: 481 | Disposition: A | Discharge status: Skilled Nursing Facility | Payer: Medicare Other | Attending: Internal Medicine | Admitting: Internal Medicine

## 2016-12-31 ENCOUNTER — Emergency Department (HOSPITAL_COMMUNITY): Payer: Medicare Other

## 2016-12-31 DIAGNOSIS — Z794 Long term (current) use of insulin: Secondary | ICD-10-CM

## 2016-12-31 DIAGNOSIS — E1122 Type 2 diabetes mellitus with diabetic chronic kidney disease: Secondary | ICD-10-CM | POA: Diagnosis present

## 2016-12-31 DIAGNOSIS — Z833 Family history of diabetes mellitus: Secondary | ICD-10-CM

## 2016-12-31 DIAGNOSIS — E1100 Type 2 diabetes mellitus with hyperosmolarity without nonketotic hyperglycemic-hyperosmolar coma (NKHHC): Secondary | ICD-10-CM

## 2016-12-31 DIAGNOSIS — X158XXS Contact with other hot household appliances, sequela: Secondary | ICD-10-CM | POA: Diagnosis present

## 2016-12-31 DIAGNOSIS — Z79899 Other long term (current) drug therapy: Secondary | ICD-10-CM

## 2016-12-31 DIAGNOSIS — N183 Chronic kidney disease, stage 3 unspecified: Secondary | ICD-10-CM | POA: Diagnosis present

## 2016-12-31 DIAGNOSIS — M25551 Pain in right hip: Secondary | ICD-10-CM | POA: Diagnosis not present

## 2016-12-31 DIAGNOSIS — Z9889 Other specified postprocedural states: Secondary | ICD-10-CM | POA: Diagnosis not present

## 2016-12-31 DIAGNOSIS — S7221XA Displaced subtrochanteric fracture of right femur, initial encounter for closed fracture: Secondary | ICD-10-CM | POA: Diagnosis not present

## 2016-12-31 DIAGNOSIS — D649 Anemia, unspecified: Secondary | ICD-10-CM | POA: Diagnosis not present

## 2016-12-31 DIAGNOSIS — Z9081 Acquired absence of spleen: Secondary | ICD-10-CM | POA: Diagnosis not present

## 2016-12-31 DIAGNOSIS — S72141A Displaced intertrochanteric fracture of right femur, initial encounter for closed fracture: Secondary | ICD-10-CM | POA: Diagnosis not present

## 2016-12-31 DIAGNOSIS — E1159 Type 2 diabetes mellitus with other circulatory complications: Secondary | ICD-10-CM | POA: Diagnosis not present

## 2016-12-31 DIAGNOSIS — Z89512 Acquired absence of left leg below knee: Secondary | ICD-10-CM | POA: Diagnosis not present

## 2016-12-31 DIAGNOSIS — Z7982 Long term (current) use of aspirin: Secondary | ICD-10-CM | POA: Diagnosis not present

## 2016-12-31 DIAGNOSIS — S72001A Fracture of unspecified part of neck of right femur, initial encounter for closed fracture: Secondary | ICD-10-CM | POA: Diagnosis present

## 2016-12-31 DIAGNOSIS — I5032 Chronic diastolic (congestive) heart failure: Secondary | ICD-10-CM | POA: Diagnosis not present

## 2016-12-31 DIAGNOSIS — I10 Essential (primary) hypertension: Secondary | ICD-10-CM | POA: Diagnosis not present

## 2016-12-31 DIAGNOSIS — Z89519 Acquired absence of unspecified leg below knee: Secondary | ICD-10-CM

## 2016-12-31 DIAGNOSIS — R0989 Other specified symptoms and signs involving the circulatory and respiratory systems: Secondary | ICD-10-CM

## 2016-12-31 DIAGNOSIS — E785 Hyperlipidemia, unspecified: Secondary | ICD-10-CM | POA: Diagnosis not present

## 2016-12-31 DIAGNOSIS — Z87891 Personal history of nicotine dependence: Secondary | ICD-10-CM

## 2016-12-31 DIAGNOSIS — S72001D Fracture of unspecified part of neck of right femur, subsequent encounter for closed fracture with routine healing: Secondary | ICD-10-CM | POA: Diagnosis not present

## 2016-12-31 DIAGNOSIS — D62 Acute posthemorrhagic anemia: Secondary | ICD-10-CM | POA: Diagnosis not present

## 2016-12-31 DIAGNOSIS — W11XXXA Fall on and from ladder, initial encounter: Secondary | ICD-10-CM | POA: Diagnosis present

## 2016-12-31 DIAGNOSIS — K219 Gastro-esophageal reflux disease without esophagitis: Secondary | ICD-10-CM | POA: Diagnosis not present

## 2016-12-31 DIAGNOSIS — D72829 Elevated white blood cell count, unspecified: Secondary | ICD-10-CM | POA: Diagnosis not present

## 2016-12-31 DIAGNOSIS — Z419 Encounter for procedure for purposes other than remedying health state, unspecified: Secondary | ICD-10-CM

## 2016-12-31 DIAGNOSIS — S72141D Displaced intertrochanteric fracture of right femur, subsequent encounter for closed fracture with routine healing: Secondary | ICD-10-CM | POA: Diagnosis not present

## 2016-12-31 DIAGNOSIS — N179 Acute kidney failure, unspecified: Secondary | ICD-10-CM

## 2016-12-31 DIAGNOSIS — D638 Anemia in other chronic diseases classified elsewhere: Secondary | ICD-10-CM | POA: Diagnosis not present

## 2016-12-31 DIAGNOSIS — R2689 Other abnormalities of gait and mobility: Secondary | ICD-10-CM | POA: Diagnosis not present

## 2016-12-31 DIAGNOSIS — T24201S Burn of second degree of unspecified site of right lower limb, except ankle and foot, sequela: Secondary | ICD-10-CM | POA: Diagnosis not present

## 2016-12-31 DIAGNOSIS — I959 Hypotension, unspecified: Secondary | ICD-10-CM | POA: Diagnosis not present

## 2016-12-31 DIAGNOSIS — I5042 Chronic combined systolic (congestive) and diastolic (congestive) heart failure: Secondary | ICD-10-CM | POA: Diagnosis present

## 2016-12-31 DIAGNOSIS — G40909 Epilepsy, unspecified, not intractable, without status epilepticus: Secondary | ICD-10-CM

## 2016-12-31 DIAGNOSIS — I130 Hypertensive heart and chronic kidney disease with heart failure and stage 1 through stage 4 chronic kidney disease, or unspecified chronic kidney disease: Secondary | ICD-10-CM | POA: Diagnosis not present

## 2016-12-31 DIAGNOSIS — I517 Cardiomegaly: Secondary | ICD-10-CM | POA: Diagnosis not present

## 2016-12-31 DIAGNOSIS — T148XXA Other injury of unspecified body region, initial encounter: Secondary | ICD-10-CM | POA: Diagnosis not present

## 2016-12-31 DIAGNOSIS — S72009A Fracture of unspecified part of neck of unspecified femur, initial encounter for closed fracture: Secondary | ICD-10-CM | POA: Diagnosis present

## 2016-12-31 DIAGNOSIS — M6281 Muscle weakness (generalized): Secondary | ICD-10-CM | POA: Diagnosis not present

## 2016-12-31 DIAGNOSIS — Z4789 Encounter for other orthopedic aftercare: Secondary | ICD-10-CM | POA: Diagnosis not present

## 2016-12-31 DIAGNOSIS — G8918 Other acute postprocedural pain: Secondary | ICD-10-CM | POA: Diagnosis not present

## 2016-12-31 LAB — CBC WITH DIFFERENTIAL/PLATELET
Basophils Absolute: 0.1 10*3/uL (ref 0.0–0.1)
Basophils Relative: 0 %
Eosinophils Absolute: 0.7 10*3/uL (ref 0.0–0.7)
Eosinophils Relative: 5 %
HCT: 31.4 % — ABNORMAL LOW (ref 39.0–52.0)
Hemoglobin: 9.8 g/dL — ABNORMAL LOW (ref 13.0–17.0)
Lymphocytes Relative: 19 %
Lymphs Abs: 2.6 10*3/uL (ref 0.7–4.0)
MCH: 28.7 pg (ref 26.0–34.0)
MCHC: 31.2 g/dL (ref 30.0–36.0)
MCV: 92.1 fL (ref 78.0–100.0)
Monocytes Absolute: 0.9 10*3/uL (ref 0.1–1.0)
Monocytes Relative: 7 %
Neutro Abs: 9.5 10*3/uL — ABNORMAL HIGH (ref 1.7–7.7)
Neutrophils Relative %: 69 %
Platelets: 374 10*3/uL (ref 150–400)
RBC: 3.41 MIL/uL — ABNORMAL LOW (ref 4.22–5.81)
RDW: 13.9 % (ref 11.5–15.5)
WBC: 13.7 10*3/uL — ABNORMAL HIGH (ref 4.0–10.5)

## 2016-12-31 LAB — URINALYSIS, ROUTINE W REFLEX MICROSCOPIC
Bacteria, UA: NONE SEEN
Bilirubin Urine: NEGATIVE
Glucose, UA: 50 mg/dL — AB
Ketones, ur: NEGATIVE mg/dL
Leukocytes, UA: NEGATIVE
Nitrite: NEGATIVE
Protein, ur: 100 mg/dL — AB
Specific Gravity, Urine: 1.011 (ref 1.005–1.030)
pH: 6 (ref 5.0–8.0)

## 2016-12-31 LAB — COMPREHENSIVE METABOLIC PANEL
ALT: 19 U/L (ref 17–63)
AST: 19 U/L (ref 15–41)
Albumin: 3.2 g/dL — ABNORMAL LOW (ref 3.5–5.0)
Alkaline Phosphatase: 84 U/L (ref 38–126)
Anion gap: 6 (ref 5–15)
BUN: 50 mg/dL — ABNORMAL HIGH (ref 6–20)
CO2: 21 mmol/L — ABNORMAL LOW (ref 22–32)
Calcium: 8.8 mg/dL — ABNORMAL LOW (ref 8.9–10.3)
Chloride: 114 mmol/L — ABNORMAL HIGH (ref 101–111)
Creatinine, Ser: 2.82 mg/dL — ABNORMAL HIGH (ref 0.61–1.24)
GFR calc Af Amer: 25 mL/min — ABNORMAL LOW (ref >60)
GFR calc non Af Amer: 22 mL/min — ABNORMAL LOW (ref >60)
Glucose, Bld: 176 mg/dL — ABNORMAL HIGH (ref 65–99)
Potassium: 4.5 mmol/L (ref 3.5–5.1)
Sodium: 141 mmol/L (ref 135–145)
Total Bilirubin: 0.4 mg/dL (ref 0.3–1.2)
Total Protein: 7.6 g/dL (ref 6.5–8.1)

## 2016-12-31 LAB — PROTIME-INR
INR: 1.08
Prothrombin Time: 14 seconds (ref 11.4–15.2)

## 2016-12-31 LAB — CBG MONITORING, ED: Glucose-Capillary: 151 mg/dL — ABNORMAL HIGH (ref 65–99)

## 2016-12-31 MED ORDER — ONDANSETRON HCL 4 MG/2ML IJ SOLN
4.0000 mg | Freq: Once | INTRAMUSCULAR | Status: AC
  Administered 2016-12-31: 4 mg via INTRAVENOUS
  Filled 2016-12-31: qty 2

## 2016-12-31 MED ORDER — FUROSEMIDE 20 MG PO TABS
20.0000 mg | ORAL_TABLET | Freq: Every day | ORAL | Status: DC
  Administered 2017-01-01 – 2017-01-03 (×2): 20 mg via ORAL
  Filled 2016-12-31 (×3): qty 1

## 2016-12-31 MED ORDER — FENTANYL CITRATE (PF) 100 MCG/2ML IJ SOLN
100.0000 ug | INTRAMUSCULAR | Status: DC | PRN
Start: 2016-12-31 — End: 2016-12-31

## 2016-12-31 MED ORDER — FENTANYL CITRATE (PF) 100 MCG/2ML IJ SOLN
100.0000 ug | Freq: Once | INTRAMUSCULAR | Status: AC
  Administered 2016-12-31: 100 ug via INTRAVENOUS
  Filled 2016-12-31: qty 2

## 2016-12-31 MED ORDER — FERROUS GLUCONATE 324 (38 FE) MG PO TABS
324.0000 mg | ORAL_TABLET | Freq: Two times a day (BID) | ORAL | Status: DC
  Administered 2017-01-02 – 2017-01-05 (×7): 324 mg via ORAL
  Filled 2016-12-31 (×10): qty 1

## 2016-12-31 MED ORDER — HYDROCODONE-ACETAMINOPHEN 5-325 MG PO TABS: 1.0000 | ORAL_TABLET | Freq: Four times a day (QID) | ORAL | Status: DC | PRN

## 2016-12-31 MED ORDER — MORPHINE SULFATE (PF) 4 MG/ML IV SOLN
4.0000 mg | INTRAVENOUS | Status: AC | PRN
Start: 2016-12-31 — End: 2017-01-01
  Administered 2016-12-31 – 2017-01-01 (×3): 4 mg via INTRAVENOUS
  Filled 2016-12-31 (×3): qty 1

## 2016-12-31 MED ORDER — ATORVASTATIN CALCIUM 20 MG PO TABS
20.0000 mg | ORAL_TABLET | Freq: Every day | ORAL | Status: DC
  Filled 2016-12-31: qty 1

## 2016-12-31 MED ORDER — INSULIN GLARGINE 100 UNIT/ML ~~LOC~~ SOLN
10.0000 [IU] | Freq: Every day | SUBCUTANEOUS | Status: DC
  Administered 2017-01-01 – 2017-01-04 (×4): 10 [IU] via SUBCUTANEOUS
  Filled 2016-12-31 (×6): qty 0.1

## 2016-12-31 MED ORDER — CARVEDILOL 12.5 MG PO TABS
12.5000 mg | ORAL_TABLET | Freq: Two times a day (BID) | ORAL | Status: DC
  Administered 2017-01-01: 12.5 mg via ORAL
  Filled 2016-12-31 (×2): qty 1

## 2016-12-31 MED ORDER — AMLODIPINE BESYLATE 10 MG PO TABS
10.0000 mg | ORAL_TABLET | Freq: Every day | ORAL | Status: DC
  Administered 2017-01-01 – 2017-01-05 (×4): 10 mg via ORAL
  Filled 2016-12-31: qty 2
  Filled 2016-12-31 (×4): qty 1

## 2016-12-31 MED ORDER — MORPHINE SULFATE (PF) 4 MG/ML IV SOLN: 0.5000 mg | INTRAVENOUS | Status: DC | PRN

## 2016-12-31 MED ORDER — INSULIN ASPART 100 UNIT/ML ~~LOC~~ SOLN
0.0000 [IU] | SUBCUTANEOUS | Status: DC
  Administered 2016-12-31 – 2017-01-01 (×3): 2 [IU] via SUBCUTANEOUS
  Administered 2017-01-01: 1 [IU] via SUBCUTANEOUS
  Administered 2017-01-02 (×2): 2 [IU] via SUBCUTANEOUS
  Filled 2016-12-31: qty 1

## 2016-12-31 MED ORDER — LACOSAMIDE 50 MG PO TABS
200.0000 mg | ORAL_TABLET | Freq: Two times a day (BID) | ORAL | Status: DC
  Administered 2017-01-01 – 2017-01-05 (×10): 200 mg via ORAL
  Filled 2016-12-31 (×10): qty 4
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
## 2017-01-01 ENCOUNTER — Inpatient Hospital Stay (HOSPITAL_COMMUNITY): Payer: Medicare Other

## 2017-01-01 ENCOUNTER — Encounter (HOSPITAL_COMMUNITY)
Admission: EM | Disposition: A | Discharge status: Skilled Nursing Facility | Payer: Self-pay | Source: Home / Self Care | Attending: Internal Medicine

## 2017-01-01 ENCOUNTER — Inpatient Hospital Stay (HOSPITAL_COMMUNITY): Payer: Medicare Other | Admitting: Anesthesiology

## 2017-01-01 ENCOUNTER — Encounter (HOSPITAL_COMMUNITY): Payer: Self-pay | Admitting: Internal Medicine

## 2017-01-01 DIAGNOSIS — S72001A Fracture of unspecified part of neck of right femur, initial encounter for closed fracture: Secondary | ICD-10-CM

## 2017-01-01 DIAGNOSIS — T24201S Burn of second degree of unspecified site of right lower limb, except ankle and foot, sequela: Secondary | ICD-10-CM

## 2017-01-01 DIAGNOSIS — S72009A Fracture of unspecified part of neck of unspecified femur, initial encounter for closed fracture: Secondary | ICD-10-CM | POA: Diagnosis present

## 2017-01-01 HISTORY — PX: INTRAMEDULLARY (IM) NAIL INTERTROCHANTERIC: SHX5875

## 2017-01-01 LAB — CREATININE, SERUM
Creatinine, Ser: 2.58 mg/dL — ABNORMAL HIGH (ref 0.61–1.24)
GFR calc Af Amer: 28 mL/min — ABNORMAL LOW (ref >60)
GFR calc non Af Amer: 24 mL/min — ABNORMAL LOW (ref >60)

## 2017-01-01 LAB — CBC
HCT: 23.3 % — ABNORMAL LOW (ref 39.0–52.0)
Hemoglobin: 7.3 g/dL — ABNORMAL LOW (ref 13.0–17.0)
MCH: 28.3 pg (ref 26.0–34.0)
MCHC: 31.3 g/dL (ref 30.0–36.0)
MCV: 90.3 fL (ref 78.0–100.0)
Platelets: 242 10*3/uL (ref 150–400)
RBC: 2.58 MIL/uL — ABNORMAL LOW (ref 4.22–5.81)
RDW: 14.6 % (ref 11.5–15.5)
WBC: 15.7 10*3/uL — ABNORMAL HIGH (ref 4.0–10.5)

## 2017-01-01 LAB — GLUCOSE, CAPILLARY
Glucose-Capillary: 128 mg/dL — ABNORMAL HIGH (ref 65–99)
Glucose-Capillary: 149 mg/dL — ABNORMAL HIGH (ref 65–99)
Glucose-Capillary: 153 mg/dL — ABNORMAL HIGH (ref 65–99)
Glucose-Capillary: 154 mg/dL — ABNORMAL HIGH (ref 65–99)
Glucose-Capillary: 158 mg/dL — ABNORMAL HIGH (ref 65–99)
Glucose-Capillary: 158 mg/dL — ABNORMAL HIGH (ref 65–99)

## 2017-01-01 LAB — CBG MONITORING, ED: Glucose-Capillary: 129 mg/dL — ABNORMAL HIGH (ref 65–99)

## 2017-01-01 SURGERY — FIXATION, FRACTURE, INTERTROCHANTERIC, WITH INTRAMEDULLARY ROD
Anesthesia: Monitor Anesthesia Care | Site: Hip | Laterality: Right

## 2017-01-01 MED ORDER — CARVEDILOL 12.5 MG PO TABS
12.5000 mg | ORAL_TABLET | Freq: Two times a day (BID) | ORAL | Status: DC
  Administered 2017-01-01 – 2017-01-05 (×7): 12.5 mg via ORAL
  Filled 2017-01-01 (×7): qty 1

## 2017-01-01 MED ORDER — ONDANSETRON HCL 4 MG/2ML IJ SOLN
4.0000 mg | Freq: Four times a day (QID) | INTRAMUSCULAR | Status: DC | PRN
  Administered 2017-01-01: 4 mg via INTRAVENOUS
  Filled 2017-01-01: qty 2

## 2017-01-01 MED ORDER — CEFAZOLIN SODIUM 1 G IJ SOLR
INTRAMUSCULAR | Status: DC | PRN
  Administered 2017-01-01: 2 g via INTRAMUSCULAR

## 2017-01-01 MED ORDER — FENTANYL CITRATE (PF) 250 MCG/5ML IJ SOLN
INTRAMUSCULAR | Status: AC
  Filled 2017-01-01: qty 5

## 2017-01-01 MED ORDER — ONDANSETRON HCL 4 MG PO TABS: 4.0000 mg | ORAL_TABLET | Freq: Four times a day (QID) | ORAL | Status: DC | PRN

## 2017-01-01 MED ORDER — EPHEDRINE SULFATE 50 MG/ML IJ SOLN
INTRAMUSCULAR | Status: DC | PRN
  Administered 2017-01-01: 5 mg via INTRAVENOUS

## 2017-01-01 MED ORDER — LACTATED RINGERS IV SOLN
INTRAVENOUS | Status: DC
  Administered 2017-01-01: 11:00:00 via INTRAVENOUS

## 2017-01-01 MED ORDER — METOCLOPRAMIDE HCL 5 MG PO TABS: 5.0000 mg | ORAL_TABLET | Freq: Three times a day (TID) | ORAL | Status: DC | PRN

## 2017-01-01 MED ORDER — SODIUM CHLORIDE 0.9 % IV SOLN
Freq: Once | INTRAVENOUS | Status: AC
  Administered 2017-01-01: 14:00:00 via INTRAVENOUS

## 2017-01-01 MED ORDER — PHENYLEPHRINE HCL 10 MG/ML IJ SOLN
INTRAVENOUS | Status: DC | PRN
  Administered 2017-01-01: 25 ug/min via INTRAVENOUS

## 2017-01-01 MED ORDER — MORPHINE SULFATE (PF) 4 MG/ML IV SOLN
2.0000 mg | INTRAVENOUS | Status: DC | PRN
  Filled 2017-01-01: qty 1

## 2017-01-01 MED ORDER — POTASSIUM CHLORIDE IN NACL 20-0.9 MEQ/L-% IV SOLN
INTRAVENOUS | Status: DC
  Administered 2017-01-01: 75 mL/h via INTRAVENOUS
  Filled 2017-01-01: qty 1000

## 2017-01-01 MED ORDER — BUPIVACAINE IN DEXTROSE 0.75-8.25 % IT SOLN
INTRATHECAL | Status: DC | PRN
  Administered 2017-01-01: 2 mL via INTRATHECAL

## 2017-01-01 MED ORDER — ATORVASTATIN CALCIUM 20 MG PO TABS
20.0000 mg | ORAL_TABLET | Freq: Every day | ORAL | Status: DC
  Administered 2017-01-01 – 2017-01-04 (×4): 20 mg via ORAL
  Filled 2017-01-01 (×3): qty 1

## 2017-01-01 MED ORDER — PHENOL 1.4 % MT LIQD: 1.0000 | OROMUCOSAL | Status: DC | PRN

## 2017-01-01 MED ORDER — METOCLOPRAMIDE HCL 5 MG/ML IJ SOLN: 5.0000 mg | Freq: Three times a day (TID) | INTRAMUSCULAR | Status: DC | PRN

## 2017-01-01 MED ORDER — FENTANYL CITRATE (PF) 100 MCG/2ML IJ SOLN
INTRAMUSCULAR | Status: DC | PRN
  Administered 2017-01-01: 50 ug via INTRAVENOUS
  Administered 2017-01-01: 25 ug via INTRAVENOUS
  Administered 2017-01-01 (×3): 50 ug via INTRAVENOUS
  Administered 2017-01-01: 25 ug via INTRAVENOUS

## 2017-01-01 MED ORDER — MIDAZOLAM HCL 2 MG/2ML IJ SOLN
INTRAMUSCULAR | Status: AC
  Filled 2017-01-01: qty 2

## 2017-01-01 MED ORDER — 0.9 % SODIUM CHLORIDE (POUR BTL) OPTIME
TOPICAL | Status: DC | PRN
  Administered 2017-01-01: 1000 mL

## 2017-01-01 MED ORDER — ACETAMINOPHEN 325 MG PO TABS
650.0000 mg | ORAL_TABLET | Freq: Four times a day (QID) | ORAL | Status: DC | PRN
  Administered 2017-01-03: 650 mg via ORAL
  Filled 2017-01-01: qty 2

## 2017-01-01 MED ORDER — ONDANSETRON HCL 4 MG/2ML IJ SOLN: 4.0000 mg | Freq: Once | INTRAMUSCULAR | Status: DC | PRN

## 2017-01-01 MED ORDER — FENTANYL CITRATE (PF) 100 MCG/2ML IJ SOLN: 25.0000 ug | INTRAMUSCULAR | Status: DC | PRN

## 2017-01-01 MED ORDER — MORPHINE SULFATE (PF) 4 MG/ML IV SOLN
2.0000 mg | INTRAVENOUS | Status: DC | PRN
  Administered 2017-01-01 – 2017-01-02 (×6): 4 mg via INTRAVENOUS
  Filled 2017-01-01 (×5): qty 1

## 2017-01-01 MED ORDER — PROPOFOL 500 MG/50ML IV EMUL
INTRAVENOUS | Status: DC | PRN
  Administered 2017-01-01: 75 ug/kg/min via INTRAVENOUS

## 2017-01-01 MED ORDER — ENOXAPARIN SODIUM 40 MG/0.4ML ~~LOC~~ SOLN
40.0000 mg | SUBCUTANEOUS | Status: DC
  Administered 2017-01-02 – 2017-01-03 (×2): 40 mg via SUBCUTANEOUS
  Filled 2017-01-01 (×2): qty 0.4

## 2017-01-01 MED ORDER — LACTATED RINGERS IV SOLN
INTRAVENOUS | Status: DC | PRN
  Administered 2017-01-01 (×2): via INTRAVENOUS

## 2017-01-01 MED ORDER — MIDAZOLAM HCL 2 MG/2ML IJ SOLN
INTRAMUSCULAR | Status: DC | PRN
  Administered 2017-01-01 (×4): 0.5 mg via INTRAVENOUS

## 2017-01-01 MED ORDER — MENTHOL 3 MG MT LOZG: 1.0000 | LOZENGE | OROMUCOSAL | Status: DC | PRN

## 2017-01-01 MED ORDER — CEFAZOLIN SODIUM-DEXTROSE 2-4 GM/100ML-% IV SOLN
2.0000 g | Freq: Four times a day (QID) | INTRAVENOUS | Status: AC
  Administered 2017-01-01 – 2017-01-02 (×2): 2 g via INTRAVENOUS
  Filled 2017-01-01 (×2): qty 100

## 2017-01-01 MED ORDER — ACETAMINOPHEN 650 MG RE SUPP: 650.0000 mg | Freq: Four times a day (QID) | RECTAL | Status: DC | PRN

## 2017-01-01 MED ORDER — ONDANSETRON HCL 4 MG/2ML IJ SOLN
INTRAMUSCULAR | Status: DC | PRN
  Administered 2017-01-01: 80 mg via INTRAVENOUS

## 2017-01-01 MED ORDER — HYDROMORPHONE HCL 1 MG/ML IJ SOLN
1.0000 mg | Freq: Once | INTRAMUSCULAR | Status: AC
  Administered 2017-01-01: 1 mg via INTRAVENOUS
  Filled 2017-01-01: qty 1

## 2017-01-01 MED ORDER — HYDROCODONE-ACETAMINOPHEN 5-325 MG PO TABS
1.0000 | ORAL_TABLET | Freq: Four times a day (QID) | ORAL | Status: DC | PRN
  Administered 2017-01-01 – 2017-01-03 (×4): 2 via ORAL
  Filled 2017-01-01 (×4): qty 2

## 2017-01-01 SURGICAL SUPPLY — 55 items
BIT DRILL SHORT 4.0 (BIT) IMPLANT
BLADE CLIPPER SURG (BLADE) IMPLANT
BLADE SURG 15 STRL LF DISP TIS (BLADE) ×1 IMPLANT
BLADE SURG 15 STRL SS (BLADE) ×3
COVER PERINEAL POST (MISCELLANEOUS) ×3 IMPLANT
COVER SURGICAL LIGHT HANDLE (MISCELLANEOUS) ×3 IMPLANT
DRAPE HALF SHEET 40X57 (DRAPES) ×2 IMPLANT
DRAPE ORTHO SPLIT 77X108 STRL (DRAPES)
DRAPE STERI IOBAN 125X83 (DRAPES) ×5 IMPLANT
DRAPE SURG ORHT 6 SPLT 77X108 (DRAPES) IMPLANT
DRAPE UTILITY 15X26 TOWEL STRL (DRAPES) ×4 IMPLANT
DRILL BIT SHORT 4.0 (BIT) ×6
DRSG AQUACEL AG ADV 3.5X 4 (GAUZE/BANDAGES/DRESSINGS) ×8 IMPLANT
DRSG MEPILEX BORDER 4X4 (GAUZE/BANDAGES/DRESSINGS) IMPLANT
DRSG MEPILEX BORDER 4X8 (GAUZE/BANDAGES/DRESSINGS) ×3 IMPLANT
DURAPREP 26ML APPLICATOR (WOUND CARE) ×3 IMPLANT
ELECT REM PT RETURN 9FT ADLT (ELECTROSURGICAL) ×3
ELECTRODE REM PT RTRN 9FT ADLT (ELECTROSURGICAL) ×1 IMPLANT
FACESHIELD WRAPAROUND (MASK) ×3 IMPLANT
FACESHIELD WRAPAROUND OR TEAM (MASK) ×1 IMPLANT
GAUZE XEROFORM 5X9 LF (GAUZE/BANDAGES/DRESSINGS) ×3 IMPLANT
GLOVE BIOGEL PI IND STRL 8 (GLOVE) ×1 IMPLANT
GLOVE BIOGEL PI INDICATOR 8 (GLOVE) ×2
GLOVE SURG ORTHO 8.0 STRL STRW (GLOVE) ×3 IMPLANT
GOWN STRL REUS W/ TWL LRG LVL3 (GOWN DISPOSABLE) ×1 IMPLANT
GOWN STRL REUS W/ TWL XL LVL3 (GOWN DISPOSABLE) IMPLANT
GOWN STRL REUS W/TWL LRG LVL3 (GOWN DISPOSABLE) ×3
GOWN STRL REUS W/TWL XL LVL3 (GOWN DISPOSABLE)
GUIDE PIN 3.2X343 (PIN) ×4
GUIDE PIN 3.2X343MM (PIN) ×12
GUIDE ROD 3.0 (MISCELLANEOUS) ×3
KIT BASIN OR (CUSTOM PROCEDURE TRAY) ×3 IMPLANT
KIT ROOM TURNOVER OR (KITS) ×3 IMPLANT
LINER BOOT UNIVERSAL DISP (MISCELLANEOUS) ×3 IMPLANT
MANIFOLD NEPTUNE II (INSTRUMENTS) ×3 IMPLANT
NAIL TRIGEN INTERT 11.5X40-125 (Nail) ×2 IMPLANT
NS IRRIG 1000ML POUR BTL (IV SOLUTION) ×3 IMPLANT
PACK GENERAL/GYN (CUSTOM PROCEDURE TRAY) ×3 IMPLANT
PAD ARMBOARD 7.5X6 YLW CONV (MISCELLANEOUS) ×6 IMPLANT
PIN GUIDE 3.2X343MM (PIN) IMPLANT
ROD GUIDE 3.0 (MISCELLANEOUS) IMPLANT
SCREW LAG COMPR KIT 115/110 (Screw) ×2 IMPLANT
SCREW TRIGEN LOW PROF 5.0X37.5 (Screw) ×2 IMPLANT
SCREW TRIGEN LOW PROF 5.0X45 (Screw) ×2 IMPLANT
SPONGE LAP 4X18 X RAY DECT (DISPOSABLE) IMPLANT
STAPLER VISISTAT 35W (STAPLE) ×3 IMPLANT
SUT ETHILON 2 0 FS 18 (SUTURE) IMPLANT
SUT ETHILON 3 0 PS 1 (SUTURE) ×10 IMPLANT
SUT VIC AB 0 CT1 27 (SUTURE) ×6
SUT VIC AB 0 CT1 27XBRD ANBCTR (SUTURE) IMPLANT
SUT VIC AB 2-0 CTB1 (SUTURE) ×3 IMPLANT
TAPE STRIPS DRAPE STRL (GAUZE/BANDAGES/DRESSINGS) IMPLANT
TOWEL OR 17X24 6PK STRL BLUE (TOWEL DISPOSABLE) ×3 IMPLANT
TOWEL OR 17X26 10 PK STRL BLUE (TOWEL DISPOSABLE) ×3 IMPLANT
WATER STERILE IRR 1000ML POUR (IV SOLUTION) ×3 IMPLANT

## 2017-01-01 NOTE — Anesthesia Procedure Notes (Signed)
Spinal  Patient location during procedure: OR Start time: 01/01/2017 2:48 PM End time: 01/01/2017 2:54 PM Staffing Anesthesiologist: Catalina Gravel Performed: anesthesiologist  Preanesthetic Checklist Completed: patient identified, surgical consent, pre-op evaluation, timeout performed, IV checked, risks and benefits discussed and monitors and equipment checked Spinal Block Patient position: right lateral decubitus Prep: site prepped and draped and DuraPrep Patient monitoring: continuous pulse ox and blood pressure Approach: midline Location: L3-4 Injection technique: single-shot Needle Needle type: Pencan  Needle gauge: 25 G Needle length: 9 cm Assessment Sensory level: T8 Additional Notes Functioning IV was confirmed and monitors were applied. Sterile prep and drape, including hand hygiene, mask and sterile gloves were used. The patient was positioned and the spine was prepped. The skin was anesthetized with lidocaine.  Free flow of clear CSF was obtained prior to injecting local anesthetic into the CSF.  The spinal needle aspirated freely following injection.  The needle was carefully withdrawn.  The patient tolerated the procedure well. Consent was obtained prior to procedure with all questions answered and concerns addressed. Risks including but not limited to bleeding, infection, nerve damage, paralysis, failed block, inadequate analgesia, allergic reaction, high spinal, itching and headache were discussed and the patient wished to proceed.   Hoy Morn, MD

## 2017-01-01 NOTE — Progress Notes (Signed)
Pelvic Xray in progress per orders

## 2017-01-01 NOTE — Brief Op Note (Signed)
12/31/2016 - 01/01/2017  6:11 PM  PATIENT:  Mark [name]  66 y.o. male  PRE-OPERATIVE DIAGNOSIS:  RIGHT HIP FRACTURE-subtroch  POST-OPERATIVE DIAGNOSIS:  RIGHT HIP [place]  PROCEDURE:  Procedure(s): INTRAMEDULLARY (IM) NAIL INTERTROCHANTRIC  SURGEON:  Surgeon(s): Meredith Pel, MD  ASSISTANT: none  ANESTHESIA:   spinal  EBL: 300 ml    Total I/O In: 1000 [I.V.:1000] Out: 600 [Urine:300; Blood:300]  BLOOD ADMINISTERED: none  DRAINS: none   LOCAL MEDICATIONS USED:  none  SPECIMEN:  No Specimen  COUNTS:  YES  TOURNIQUET:  * No tourniquets in log *  DICTATION: .Other Dictation: Dictation Number [id number]  PLAN OF CARE: Admit to inpatient   PATIENT DISPOSITION:  PACU - hemodynamically stable

## 2017-01-01 NOTE — ED Notes (Signed)
Confirmed with [facility], patient is on list for transport.
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
## 2017-01-01 NOTE — Progress Notes (Signed)
Nutrition Brief Note  RD consulted due to hip fx protocol.   Wt Readings from Last 15 Encounters:  12/31/16 206 lb (93.4 kg)  11/22/16 217 lb (98.4 kg)  11/16/16 217 lb (98.4 kg)  11/02/16 206 lb (93.4 kg)  10/25/16 213 lb (96.6 kg)  10/12/16 213 lb 9.6 oz (96.9 kg)  09/17/16 217 lb 6.4 oz (98.6 kg)  07/14/16 185 lb (83.9 kg)  07/13/16 185 lb 14.4 oz (84.3 kg)  06/23/16 205 lb (93 kg)  06/03/16 208 lb (94.3 kg)  05/04/16 204 lb (92.5 kg)  04/27/16 205 lb (93 kg)  04/20/16 205 lb (93 kg)  04/15/16 200 lb 3.2 oz (90.8 kg)   Body mass index is 26.45 kg/m. Patient meets criteria for Overweight based on current BMI.   Pt seen due to potential of poor nutritional status leading to hip fx. Patient had fallen off a ladder.   He reports following a quasi- DM diet. He has some good dietary habits (eating veggies, frequently meals, whole grains) and has some less than ideal habits (cornbread, cran/apple juice).  Current diet order is NPO, in anticipation for surgery  He reports having a very good appetite. He was maintaining weight. He is very physically active and says he was mowing >12 yards/week with a walking motor. He was He takes a MVI He checks his bgs frequently and says they are well controlled (never over 200)   No nutrition interventions warranted at this time. If nutrition issues arise, please consult RD.   Burtis Junes RD, LDN, CNSC Clinical Nutrition Pager: [phone] 01/01/2017 12:36 PM

## 2017-01-01 NOTE — Anesthesia Preprocedure Evaluation (Addendum)
Anesthesia Evaluation  Patient identified by MRN, date of birth, ID band Patient awake    Reviewed: Allergy & Precautions, NPO status , Patient's Chart, lab work & pertinent test results, reviewed documented beta blocker date and time   Airway Mallampati: II  TM Distance: >3 FB Neck ROM: Full    Dental  (+) Dental Advisory Given, Edentulous Upper, Edentulous Lower   Pulmonary former smoker,    Pulmonary exam normal breath sounds clear to auscultation       Cardiovascular hypertension, Pt. on medications and Pt. on home beta blockers + Peripheral Vascular Disease and +CHF  Normal cardiovascular exam Rhythm:Regular Rate:Normal  Echo 2017: Study Conclusions  - Left ventricle: The cavity size was normal. Wall thickness was increased in a pattern of moderate LVH. Systolic function was normal. The estimated ejection fraction was in the range of 60% to 65%. Wall motion was normal; there were no regional wall motion abnormalities. Doppler parameters are consistent with abnormal left ventricular relaxation (grade 1 diastolic dysfunction). - Aortic valve: Nodular calcification of the non coronary cusp. - Left atrium: The atrium was mildly dilated. - Atrial septum: No defect or patent foramen ovale was identified.   Neuro/Psych Seizures -, Well Controlled,   Neuromuscular disease negative psych ROS   GI/Hepatic GERD  ,(+)     substance abuse  cocaine use,   Endo/Other  diabetes, Type 2, Insulin Dependent  Renal/GU Renal InsufficiencyRenal disease     Musculoskeletal negative musculoskeletal ROS (+)   Abdominal   Peds  Hematology  (+) Blood dyscrasia, anemia , Plt 374k   Anesthesia Other Findings Day of surgery medications reviewed with the patient.  Reproductive/Obstetrics                            Anesthesia Physical Anesthesia Plan  ASA: III  Anesthesia Plan: Spinal   Post-op Pain  Management:    Induction:   Airway Management Planned: Simple Face Mask  Additional Equipment:   Intra-op Plan:   Post-operative Plan:   Informed Consent: I have reviewed the patients History and Physical, chart, labs and discussed the procedure including the risks, benefits and alternatives for the proposed anesthesia with the patient or authorized representative who has indicated his/her understanding and acceptance.   Dental advisory given  Plan Discussed with: CRNA, Anesthesiologist and Surgeon  Anesthesia Plan Comments: (Discussed risks and benefits of and differences between spinal and general. Discussed risks of spinal including headache, backache, failure, bleeding, infection, and nerve damage. Patient consents to spinal. Questions answered. Coagulation studies and platelet count acceptable.)       Anesthesia Quick Evaluation

## 2017-01-01 NOTE — Transfer of Care (Signed)
Immediate Anesthesia Transfer of Care Note  Patient: Mark [name]  Procedure(s) Performed: Procedure(s): INTRAMEDULLARY (IM) NAIL INTERTROCHANTRIC (Right)  Patient Location: PACU  Anesthesia Type:MAC and Spinal  Level of Consciousness: awake, alert , oriented and patient cooperative  Airway & Oxygen Therapy: Patient Spontanous Breathing and Patient connected to nasal cannula oxygen  Post-op Assessment: Report given to RN and Post -op Vital signs reviewed and stable  Post vital signs: Reviewed and stable  Last Vitals:  Vitals:   01/01/17 0900 01/01/17 1240  BP: (!) 153/71 135/75  Pulse: 68 61  Resp:  17  Temp:  36.8 C    Last Pain:  Vitals:   01/01/17 1312  TempSrc:   PainSc: 6       Patients Stated Pain Goal: 4 (03/70/48 8891)  Complications: No apparent anesthesia complications

## 2017-01-01 NOTE — Anesthesia Postprocedure Evaluation (Signed)
Anesthesia Post Note  Patient: Mark [name]  Procedure(s) Performed: Procedure(s) (LRB): INTRAMEDULLARY (IM) NAIL INTERTROCHANTRIC (Right)  Patient location during evaluation: PACU Anesthesia Type: MAC Level of consciousness: oriented and awake and alert Pain management: pain level controlled Vital Signs Assessment: post-procedure vital signs reviewed and stable Respiratory status: spontaneous breathing, respiratory function stable and patient connected to nasal cannula oxygen Cardiovascular status: blood pressure returned to baseline and stable Postop Assessment: no headache, no backache, spinal receding, no signs of nausea or vomiting and patient able to bend at knees Anesthetic complications: no       Last Vitals:  Vitals:   01/01/17 1825 01/01/17 1841  BP: 106/68 116/83  Pulse: 71 80  Resp: (!) 8 11  Temp:      Last Pain:  Vitals:   01/01/17 1811  TempSrc:   PainSc: 0-No pain                 Catalina Gravel
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
## 2017-01-02 DIAGNOSIS — D62 Acute posthemorrhagic anemia: Secondary | ICD-10-CM | POA: Diagnosis not present

## 2017-01-02 LAB — BASIC METABOLIC PANEL
Anion gap: 9 (ref 5–15)
BUN: 53 mg/dL — ABNORMAL HIGH (ref 6–20)
CO2: 21 mmol/L — ABNORMAL LOW (ref 22–32)
Calcium: 8 mg/dL — ABNORMAL LOW (ref 8.9–10.3)
Chloride: 111 mmol/L (ref 101–111)
Creatinine, Ser: 2.84 mg/dL — ABNORMAL HIGH (ref 0.61–1.24)
GFR calc Af Amer: 25 mL/min — ABNORMAL LOW (ref >60)
GFR calc non Af Amer: 22 mL/min — ABNORMAL LOW (ref >60)
Glucose, Bld: 193 mg/dL — ABNORMAL HIGH (ref 65–99)
Potassium: 4.8 mmol/L (ref 3.5–5.1)
Sodium: 141 mmol/L (ref 135–145)

## 2017-01-02 LAB — CBC
HCT: 20.9 % — ABNORMAL LOW (ref 39.0–52.0)
Hemoglobin: 6.5 g/dL — CL (ref 13.0–17.0)
MCH: 27.9 pg (ref 26.0–34.0)
MCHC: 31.1 g/dL (ref 30.0–36.0)
MCV: 89.7 fL (ref 78.0–100.0)
Platelets: 265 10*3/uL (ref 150–400)
RBC: 2.33 MIL/uL — ABNORMAL LOW (ref 4.22–5.81)
RDW: 14.4 % (ref 11.5–15.5)
WBC: 12.2 10*3/uL — ABNORMAL HIGH (ref 4.0–10.5)

## 2017-01-02 LAB — GLUCOSE, CAPILLARY
Glucose-Capillary: 174 mg/dL — ABNORMAL HIGH (ref 65–99)
Glucose-Capillary: 165 mg/dL — ABNORMAL HIGH (ref 65–99)
Glucose-Capillary: 171 mg/dL — ABNORMAL HIGH (ref 65–99)
Glucose-Capillary: 154 mg/dL — ABNORMAL HIGH (ref 65–99)
Glucose-Capillary: 160 mg/dL — ABNORMAL HIGH (ref 65–99)

## 2017-01-02 LAB — PROTIME-INR
INR: 1.26
Prothrombin Time: 15.9 seconds — ABNORMAL HIGH (ref 11.4–15.2)

## 2017-01-02 LAB — URINE CULTURE: Culture: NO GROWTH

## 2017-01-02 LAB — PREPARE RBC (CROSSMATCH)

## 2017-01-02 MED ORDER — INSULIN ASPART 100 UNIT/ML ~~LOC~~ SOLN
0.0000 [IU] | Freq: Three times a day (TID) | SUBCUTANEOUS | Status: DC
  Administered 2017-01-02 – 2017-01-03 (×3): 2 [IU] via SUBCUTANEOUS
  Administered 2017-01-03 – 2017-01-04 (×3): 1 [IU] via SUBCUTANEOUS
  Administered 2017-01-04 – 2017-01-05 (×3): 2 [IU] via SUBCUTANEOUS

## 2017-01-02 MED ORDER — DIPHENHYDRAMINE HCL 25 MG PO CAPS: 25.0000 mg | ORAL_CAPSULE | Freq: Once | ORAL | Status: DC

## 2017-01-02 MED ORDER — ACETAMINOPHEN 325 MG PO TABS
650.0000 mg | ORAL_TABLET | Freq: Once | ORAL | Status: AC
  Administered 2017-01-02: 650 mg via ORAL
  Filled 2017-01-02: qty 2

## 2017-01-02 MED ORDER — SODIUM CHLORIDE 0.9 % IV BOLUS (SEPSIS)
500.0000 mL | Freq: Once | INTRAVENOUS | Status: AC
  Administered 2017-01-02: 500 mL via INTRAVENOUS

## 2017-01-02 MED ORDER — SODIUM CHLORIDE 0.9 % IV SOLN
Freq: Once | INTRAVENOUS | Status: AC
  Administered 2017-01-02: 10:00:00 via INTRAVENOUS

## 2017-01-02 MED ORDER — FUROSEMIDE 10 MG/ML IJ SOLN
20.0000 mg | Freq: Once | INTRAMUSCULAR | Status: AC
  Administered 2017-01-02: 20 mg via INTRAVENOUS
  Filled 2017-01-02: qty 2

## 2017-01-02 MED ORDER — INSULIN ASPART 100 UNIT/ML ~~LOC~~ SOLN
0.0000 [IU] | Freq: Every day | SUBCUTANEOUS | Status: DC
  Administered 2017-01-04: 2 [IU] via SUBCUTANEOUS

## 2017-01-02 MED ORDER — FUROSEMIDE 10 MG/ML IJ SOLN: 20.0000 mg | Freq: Once | INTRAMUSCULAR | Status: DC

## 2017-01-02 MED ORDER — SODIUM CHLORIDE 0.9 % IV SOLN: Freq: Once | INTRAVENOUS | Status: DC

## 2017-01-02 NOTE — Progress Notes (Signed)
Subjective: Patient stable but having pain.   Objective: Vital signs in last 24 hours: Temp:  [97.7 F (36.5 C)-99.2 F (37.3 C)] 98.7 F (37.1 C) (05/06 0854) Pulse Rate:  [61-80] 66 (05/06 0943) Resp:  [10-20] 12 (05/06 0854) BP: (81-135)/(42-84) 117/53 (05/06 1037) SpO2:  [97 %-100 %] 97 % (05/06 0854)  Intake/Output from previous day: 05/05 0701 - 05/06 0700 In: 2831.3 [P.O.:240; I.V.:2111.3] Out: 1450 [Urine:1150; Blood:300] Intake/Output this shift: Total I/O In: 240 [P.O.:240] Out: -   Exam:  Ankle dorsiflexion plantar flexion intact on the right-hand side.  On swelling noted in the thigh region.  Compartments otherwise soft.  Labs:  Recent Labs  12/31/16 1822 01/01/17 2132 01/02/17 0259  HGB 9.8* 7.3* 6.5*    Recent Labs  01/01/17 2132 01/02/17 0259  WBC 15.7* 12.2*  RBC 2.58* 2.33*  HCT 23.3* 20.9*  PLT 242 265    Recent Labs  12/31/16 1822 01/01/17 2132 01/02/17 0259  NA 141  --  141  K 4.5  --  4.8  CL 114*  --  111  CO2 21*  --  21*  BUN 50*  --  53*  CREATININE 2.82* 2.58* 2.84*  GLUCOSE 176*  --  193*  CALCIUM 8.8*  --  8.0*    Recent Labs  12/31/16 1822 01/02/17 0259  INR 1.08 1.26    Assessment/Plan: Plan at this time is to transfuse 2 units of packed red blood cells for hemoglobin 6.5.  Beginning hemoglobin 9.87 this is not totally unexpected.  Want to try to mobilize him when possible.  Okay to be weightbearing as tolerated right lower extremity for transfers.  Anticipate 2 more days hospitalization prior to discharge   G Alphonzo Severance 01/02/2017, 10:54 AM

## 2017-01-02 NOTE — Progress Notes (Signed)
CRITICAL VALUE ALERT  Critical value received:  hgb 6.5  Date of notification:  01/02/17  Time of notification:  0419  Critical value read back:yes  Nurse who received alert:  Arthor Captain LPN  MD notified (1st page):  Dr Maudie Mercury  Time of first page:  0425  MD notified (2nd page):  Time of second page:  Responding MD:  Dr. Maudie Mercury  Time MD responded:  [phone]

## 2017-01-02 NOTE — Progress Notes (Signed)
OT Cancellation Note  Patient Details Name: ORESTES GEIMAN MRN: [id number] DOB: 20-Apr-1951   Cancelled Treatment:    Reason Eval/Treat Not Completed: Patient not medically ready. Noted Hgb 6.5 coupled with hypotension this am. Will follow up later today if time allows.    Redmond Baseman, MS, OTR/L 01/02/2017, 9:58 AM

## 2017-01-02 NOTE — Progress Notes (Signed)
PT Cancellation Note  Patient Details Name: Mark [name] MRN: [id number] DOB: 05/25/1951   Cancelled Treatment:    Reason Eval/Treat Not Completed: Medical issues which prohibited therapy   Noted Hgb 6.5 coupled with hypotension this am;   Will follow up later today as time allows;  Otherwise, will follow up for PT tomorrow;   Thank you,  Roney [name], PT  Acute Rehabilitation Services Pager [phone] Office [phone]     Colletta [place] 01/02/2017, 9:40 AM

## 2017-01-02 NOTE — Progress Notes (Signed)
Patient had a complaint of pelvic and back pain stated that he feels like he has to (pee) but can't and it hurts. Bladder Scan for >283mls because of discomfort I&O cath was done resulted in 850 mls. Patient stated that he felt better but back pain was still 10/10 4 mg of Morphine IV given. Will continue to monitor. Arthor Captain LPN
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
## 2017-01-02 NOTE — Progress Notes (Signed)
Orthopedic Tech Progress Note Patient Details:  Mark [name] 11-15-1950 [id number]  Ortho Devices Ortho Device/Splint Location: Trapeze bar Ortho Device/Splint Interventions: Application   [place] Pink 01/02/2017, 12:07 PM

## 2017-01-02 NOTE — Progress Notes (Signed)
Vitals:   01/02/17 0915 01/02/17 0921  BP: (!) 81/42 (!) 83/45  Pulse:    Resp:    Temp:     Dr. Rockne Menghini paged about hypotension. 500cc nacl bolus being given. Will reassess once complete.

## 2017-01-03 DIAGNOSIS — Z89512 Acquired absence of left leg below knee: Secondary | ICD-10-CM

## 2017-01-03 DIAGNOSIS — N183 Chronic kidney disease, stage 3 (moderate): Secondary | ICD-10-CM

## 2017-01-03 DIAGNOSIS — N179 Acute kidney failure, unspecified: Secondary | ICD-10-CM

## 2017-01-03 DIAGNOSIS — I959 Hypotension, unspecified: Secondary | ICD-10-CM

## 2017-01-03 DIAGNOSIS — I10 Essential (primary) hypertension: Secondary | ICD-10-CM

## 2017-01-03 DIAGNOSIS — G40909 Epilepsy, unspecified, not intractable, without status epilepticus: Secondary | ICD-10-CM

## 2017-01-03 DIAGNOSIS — S72001D Fracture of unspecified part of neck of right femur, subsequent encounter for closed fracture with routine healing: Secondary | ICD-10-CM

## 2017-01-03 DIAGNOSIS — D72829 Elevated white blood cell count, unspecified: Secondary | ICD-10-CM

## 2017-01-03 DIAGNOSIS — E1159 Type 2 diabetes mellitus with other circulatory complications: Secondary | ICD-10-CM

## 2017-01-03 DIAGNOSIS — D62 Acute posthemorrhagic anemia: Secondary | ICD-10-CM

## 2017-01-03 DIAGNOSIS — G8918 Other acute postprocedural pain: Secondary | ICD-10-CM

## 2017-01-03 DIAGNOSIS — R0989 Other specified symptoms and signs involving the circulatory and respiratory systems: Secondary | ICD-10-CM

## 2017-01-03 LAB — BASIC METABOLIC PANEL
Anion gap: 7 (ref 5–15)
BUN: 56 mg/dL — ABNORMAL HIGH (ref 6–20)
CO2: 20 mmol/L — ABNORMAL LOW (ref 22–32)
Calcium: 8.1 mg/dL — ABNORMAL LOW (ref 8.9–10.3)
Chloride: 113 mmol/L — ABNORMAL HIGH (ref 101–111)
Creatinine, Ser: 3.2 mg/dL — ABNORMAL HIGH (ref 0.61–1.24)
GFR calc Af Amer: 22 mL/min — ABNORMAL LOW (ref >60)
GFR calc non Af Amer: 19 mL/min — ABNORMAL LOW (ref >60)
Glucose, Bld: 152 mg/dL — ABNORMAL HIGH (ref 65–99)
Potassium: 4.6 mmol/L (ref 3.5–5.1)
Sodium: 140 mmol/L (ref 135–145)

## 2017-01-03 LAB — CBC
HCT: 24.8 % — ABNORMAL LOW (ref 39.0–52.0)
Hemoglobin: 8 g/dL — ABNORMAL LOW (ref 13.0–17.0)
MCH: 28.5 pg (ref 26.0–34.0)
MCHC: 32.3 g/dL (ref 30.0–36.0)
MCV: 88.3 fL (ref 78.0–100.0)
Platelets: 232 10*3/uL (ref 150–400)
RBC: 2.81 MIL/uL — ABNORMAL LOW (ref 4.22–5.81)
RDW: 14.6 % (ref 11.5–15.5)
WBC: 15.1 10*3/uL — ABNORMAL HIGH (ref 4.0–10.5)

## 2017-01-03 LAB — GLUCOSE, CAPILLARY
Glucose-Capillary: 143 mg/dL — ABNORMAL HIGH (ref 65–99)
Glucose-Capillary: 192 mg/dL — ABNORMAL HIGH (ref 65–99)
Glucose-Capillary: 148 mg/dL — ABNORMAL HIGH (ref 65–99)
Glucose-Capillary: 131 mg/dL — ABNORMAL HIGH (ref 65–99)

## 2017-01-03 LAB — PROTIME-INR
INR: 1.29
Prothrombin Time: 16.2 seconds — ABNORMAL HIGH (ref 11.4–15.2)

## 2017-01-03 MED ORDER — HYDROCODONE-ACETAMINOPHEN 5-325 MG PO TABS
1.0000 | ORAL_TABLET | Freq: Four times a day (QID) | ORAL | Status: DC | PRN
  Administered 2017-01-03 – 2017-01-04 (×2): 1 via ORAL
  Filled 2017-01-03 (×2): qty 1

## 2017-01-03 MED ORDER — ENOXAPARIN SODIUM 30 MG/0.3ML ~~LOC~~ SOLN
30.0000 mg | SUBCUTANEOUS | Status: DC
  Administered 2017-01-04: 30 mg via SUBCUTANEOUS
  Filled 2017-01-03: qty 0.3

## 2017-01-03 MED ORDER — SODIUM CHLORIDE 0.9 % IV SOLN
INTRAVENOUS | Status: DC
  Administered 2017-01-03 – 2017-01-04 (×2): via INTRAVENOUS

## 2017-01-03 NOTE — Evaluation (Signed)
Physical Therapy Evaluation Patient Details Name: Mark [name] MRN: [id number] DOB: November 15, 1950 Today's Date: 01/03/2017   History of Present Illness  Pt is a 66 yo male with c/o R hip pain s/p fall of 8 foot ladder, dx R hip fx s/p intertrochanteric intramedullary nail fixation 01/01/17. PMH significant for L BKA (2014), DM2, CKD stage 3, HTN and CHF.  Clinical Impression  Patient is s/p above surgery resulting in functional limitations due to the deficits listed below (see PT Problem List). Pt is maxAx2 for bed mobility, and stand pivot transfer to chair using RW and R BKA prosthetic. Patient will benefit from skilled PT to increase their independence and safety with mobility to allow discharge to the venue listed below.       Follow Up Recommendations CIR    Equipment Recommendations  Other (comment) (TBD at next venue)    Recommendations for Other Services Rehab consult     Precautions / Restrictions Precautions Precautions: Fall Required Braces or Orthoses:  (L BKA prosthetic) Restrictions Weight Bearing Restrictions: Yes RLE Weight Bearing: Weight bearing as tolerated      Mobility  Bed Mobility Overal bed mobility: Needs Assistance Bed Mobility: Supine to Sit     Supine to sit: Max assist;+2 for physical assistance     General bed mobility comments: Pt required Max A to transition BLE and support trunk into sitting positioning. Pt had difficulty transitioning into upright position due to pain in RLE and L BKA  Transfers Overall transfer level: Needs assistance Equipment used: Rolling walker (2 wheeled) Transfers: Stand Pivot Transfers   Stand pivot transfers: Max assist;+2 physical assistance       General transfer comment: Pt used prosthetic on LLE. Prosthetic did not lock in standing but pt able to pivoted on LLE to transition to recliner with Max A +2 and RW         Balance Overall balance assessment: Needs assistance                                           Pertinent Vitals/Pain Pain Assessment: Faces Faces Pain Scale: Hurts even more Pain Location: R hip and leg Pain Descriptors / Indicators: Aching;Constant;Grimacing;Guarding Pain Intervention(s): Monitored during session;RN gave pain meds during session;Repositioned;Limited activity within patient's tolerance  VSS    Home Living Family/patient expects to be discharged to:: Private residence Living Arrangements: Spouse/significant other Available Help at Discharge: Family;Available PRN/intermittently Type of Home: House Home Access: Stairs to enter Entrance Stairs-Rails: None Entrance Stairs-Number of Steps: 1 Home Layout: One level Home Equipment: Walker - 2 wheels;Shower seat;Adaptive equipment;Hand held shower head      Prior Function Level of Independence: Independent               Hand Dominance   Dominant Hand: Right    Extremity/Trunk Assessment   Upper Extremity Assessment Upper Extremity Assessment: Defer to OT evaluation    Lower Extremity Assessment Lower Extremity Assessment: LLE deficits/detail;RLE deficits/detail RLE Deficits / Details: R hip and knee ROM and strength limited by surgical pain. R ankle ROM WFL and strength grossly 4/5  LLE Deficits / Details: L BKA, hip and knee strength grossly 4/5    Cervical / Trunk Assessment Cervical / Trunk Assessment: Normal  Communication      Cognition Arousal/Alertness: Awake/alert Behavior During Therapy: WFL for tasks assessed/performed Overall Cognitive Status: Within Functional Limits for tasks  assessed                                               Assessment/Plan    PT Assessment Patient needs continued PT services  PT Problem List Decreased strength;Decreased range of motion;Decreased activity tolerance;Decreased mobility;Decreased knowledge of use of DME;Pain;Decreased safety awareness       PT Treatment Interventions DME instruction;Gait  training;Functional mobility training;Therapeutic activities;Therapeutic exercise;Balance training;Stair training;Patient/family education    PT Goals (Current goals can be found in the Care Plan section)  Acute Rehab PT Goals Patient Stated Goal: have less pain PT Goal Formulation: With patient Time For Goal Achievement: 01/17/17 Potential to Achieve Goals: Good    Frequency Min 5X/week        Co-evaluation PT/OT/SLP Co-Evaluation/Treatment: Yes   PT goals addressed during session: Mobility/safety with mobility;Proper use of DME;Strengthening/ROM         AM-PAC PT "6 Clicks" Daily Activity  Outcome Measure Difficulty turning over in bed (including adjusting bedclothes, sheets and blankets)?: Total Difficulty moving from lying on back to sitting on the side of the bed? : Total Difficulty sitting down on and standing up from a chair with arms (e.g., wheelchair, bedside commode, etc,.)?: Total Help needed moving to and from a bed to chair (including a wheelchair)?: A Lot Help needed walking in hospital room?: Total Help needed climbing 3-5 steps with a railing? : Total 6 Click Score: 7    End of Session Equipment Utilized During Treatment: Gait belt Activity Tolerance: Patient limited by pain Patient left: in chair;with call bell/phone within reach Nurse Communication: Mobility status;Weight bearing status PT Visit Diagnosis: Unsteadiness on feet (R26.81);Other abnormalities of gait and mobility (R26.89);History of falling (Z91.81);Pain;Muscle weakness (generalized) (M62.81) Pain - Right/Left: Left Pain - part of body: Hip    Time: 0756-0829 PT Time Calculation (min) (ACUTE ONLY): 33 min   Charges:   PT Evaluation $PT Eval Low Complexity: 1 Procedure     PT G Codes:        Domanick Cuccia B. Migdalia Dk PT, DPT Acute Rehabilitation  [phone] Pager [phone]    [place] 01/03/2017, 9:03 AM
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
## 2017-01-03 NOTE — Progress Notes (Signed)
Physical Therapy Treatment Patient Details Name: Mark [name] MRN: [id number] DOB: 1950-09-30 Today's Date: 01/03/2017    History of Present Illness Pt is a 66 yo male with c/o R hip pain s/p fall of 8 foot ladder, dx R hip fx s/p intertrochanteric intramedullary nail fixation 01/01/17. PMH significant for L BKA (2014), DM2, CKD stage 3, HTN and CHF.    PT Comments    Pt requested nursing tech help him get back to bed, due to the complexity of pt mobility deficits, nursing tech requested PT help in getting back to bed. Pt donned L LE prosthetic but unable to seat without weightbearing through L LE. Pt maxA x2 fpr sit>stand at RW, as pt was attempting to seat L LE prosthetic, R knee buckled due to pain and weakness and pt quickly lowered back into recliner. Pt too fatigued to try again and Maximove was utilized to get patient back to bed. Pt requires skilled PT for continued transfer training and to improve LE strength, ROM and endurance to maximize his mobility in his discharge destination.      Follow Up Recommendations  CIR     Equipment Recommendations  Other (comment) (TBD at next venue)    Recommendations for Other Services Rehab consult     Precautions / Restrictions Precautions Precautions: Fall Required Braces or Orthoses:  (L BKA prosthetic) Restrictions Weight Bearing Restrictions: Yes RLE Weight Bearing: Weight bearing as tolerated    Mobility  Bed Mobility Overal bed mobility: Needs Assistance Bed Mobility: Rolling Rolling: Max assist;Mod assist         General bed mobility comments: Pt required modA for rolling to remove pad after lift transfer to bed  Transfers Overall transfer level: Needs assistance Equipment used: Rolling walker (2 wheeled) Transfers: Stand Pivot Transfers   Stand pivot transfers: Max assist;+2 physical assistance       General transfer comment: Pt attempted stand pivot transfer to bed however unable to seat L LE prosthetic and R  knee buckled in standing and pt quickly sat back in recliner. Pt then transferred using Maximove back into bed.       Balance Overall balance assessment: Needs assistance Sitting-balance support: Feet supported Sitting balance-Leahy Scale: Poor Sitting balance - Comments: pt able to sit edge of recliner with minA                                     Cognition Arousal/Alertness: Awake/alert Behavior During Therapy: WFL for tasks assessed/performed Overall Cognitive Status: Within Functional Limits for tasks assessed                                           General Comments General comments (skin integrity, edema, etc.): Pt was in chair for five and a half hours and was too weak to get back into bed under his own power.       Pertinent Vitals/Pain Pain Assessment: 0-10 Pain Score: 9  Pain Location: R hip and leg Pain Descriptors / Indicators: Aching;Constant;Grimacing;Guarding;Sore;Shooting Pain Intervention(s): Monitored during session;Patient requesting pain meds-RN notified           PT Goals (current goals can now be found in the care plan section) Acute Rehab PT Goals Patient Stated Goal: have less pain PT Goal Formulation: With patient Time For Goal Achievement: 01/17/17  Potential to Achieve Goals: Good    Frequency    Min 5X/week      PT Plan         AM-PAC PT "6 Clicks" Daily Activity  Outcome Measure  Difficulty turning over in bed (including adjusting bedclothes, sheets and blankets)?: Total Difficulty moving from lying on back to sitting on the side of the bed? : Total Difficulty sitting down on and standing up from a chair with arms (e.g., wheelchair, bedside commode, etc,.)?: Total Help needed moving to and from a bed to chair (including a wheelchair)?: A Lot Help needed walking in hospital room?: Total Help needed climbing 3-5 steps with a railing? : Total 6 Click Score: 7    End of Session Equipment Utilized  During Treatment: Gait belt Activity Tolerance: Patient limited by pain Patient left: in chair;with call bell/phone within reach Nurse Communication: Mobility status;Weight bearing status PT Visit Diagnosis: Unsteadiness on feet (R26.81);Other abnormalities of gait and mobility (R26.89);History of falling (Z91.81);Pain;Muscle weakness (generalized) (M62.81) Pain - Right/Left: Left Pain - part of body: Hip     Time: 2641-5830 PT Time Calculation (min) (ACUTE ONLY): 16 min  Charges:  $Therapeutic Activity: 8-22 mins                    G Codes:       Ceaser Ebeling B. Migdalia Dk PT, DPT Acute Rehabilitation  [phone] Pager [phone]    [place] 01/03/2017, 3:32 PM

## 2017-01-03 NOTE — Progress Notes (Addendum)
I met with pt at bedside to begin discussions of rehab venue options. Pt visibly frustrated with his current situation. He gave me permission to contact his wife to discuss rehab venue and caregiver support. Dr. Marlou Sa in to see pt and we discussed WBAT for transfers only at this time. I placed call to pt's wife and await her return call and discussion. [phone]  Pt's wife returned my call and states pt does not have caregiver support when she is working and is requesting SNF rehab at this time. I have notified RN CM and we will sign off. [phone]
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
## 2017-01-03 NOTE — Progress Notes (Signed)
Subjective: Pt stable - pain ok   Objective: Vital signs in last 24 hours: Temp:  [98.8 F (37.1 C)-100.1 F (37.8 C)] 98.8 F (37.1 C) (05/07 0559) Pulse Rate:  [78-85] 85 (05/07 0559) Resp:  [16] 16 (05/07 0559) BP: (118-166)/(56-81) 166/81 (05/07 0559) SpO2:  [90 %-98 %] 92 % (05/07 0559)  Intake/Output from previous day: 05/06 0701 - 05/07 0700 In: 1240 [P.O.:570; Blood:670] Out: 1400 [Urine:1400] Intake/Output this shift: Total I/O In: 240 [P.O.:240] Out: -   Exam:  Dorsiflexion/Plantar flexion intact  Labs:  Recent Labs  12/31/16 1822 01/01/17 2132 01/02/17 0259 01/03/17 0312  HGB 9.8* 7.3* 6.5* 8.0*    Recent Labs  01/02/17 0259 01/03/17 0312  WBC 12.2* 15.1*  RBC 2.33* 2.81*  HCT 20.9* 24.8*  PLT 265 232    Recent Labs  01/02/17 0259 01/03/17 0312  NA 141 140  K 4.8 4.6  CL 111 113*  CO2 21* 20*  BUN 53* 56*  CREATININE 2.84* 3.20*  GLUCOSE 193* 152*  CALCIUM 8.0* 8.1*    Recent Labs  01/02/17 0259 01/03/17 0312  INR 1.26 1.29    Assessment/Plan: Plan for rehab when medically able - hgb 8 today but cr slightly increasing   [facility] [employer] 01/03/2017, 12:32 PM
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
## 2017-01-03 NOTE — Op Note (Signed)
NAME:  Mark [name], Mark [name]                   ACCOUNT NO.:  MEDICAL RECORD NO.:  [id number]  LOCATION:                                 FACILITY:  PHYSICIAN:  Anderson Malta, M.D.    DATE OF BIRTH:  Jan 18, 1951  DATE OF PROCEDURE: DATE OF DISCHARGE:                              OPERATIVE REPORT   PREOPERATIVE DIAGNOSIS:  Right hip subtrochanteric femur fracture.  POSTOPERATIVE DIAGNOSIS:  Right hip subtrochanteric femur fracture.  PROCEDURE:  Right hip subtrochanteric femur fracture open reduction and internal fixation with intramedullary nail, Smith and Nephew 11 mm utilizing 115-mm lag screw and 110-mm compression screw with two distal interlocking screws.  This was a reamed nail Reaming up to 13 mm.  INDICATIONS:  Dagmawi is a 66 year old patient with left BKA, who fell and sustained subtrochanteric femur fracture on the right.  He presents now for operative management after explanation of risks and benefits.  PROCEDURE IN DETAIL:  The patient was brought to the operating room where spinal anesthetic was induced.  Preoperative antibiotics were administered.  Time-out was called.  The patient was placed on the fracture bed with the left leg in lithotomy position, well padded and wrapped.  This was his BKA.  Right leg was placed under some traction. External rotation allowed the best fracture alignment of the shaft to the trochanter.  The proximal piece was externally rotated and abducted. Right hip region was then prescrubbed with alcohol and Betadine, allowed to air dry, prepped with DuraPrep solution and draped in sterile manner. Time-out was called.  Under fluoroscopic guidance, the high energy nature of the fracture was appreciated.  It required a fluoroscopy to match the external rotation of the proximal fragment in order to achieve some semblance of normal trochanteric anatomy.  Guidepin was then placed with the location proportional to the amount of external rotation of  the proximal fragment.  Guidepin was placed.  Distal fragment required superior and medial translation with a hammer in order to effect reduction and loss of angulation at the fracture site.  With this in position, an attempt was made to put Schanz pin in the trochanter in order to facilitate motion and reduction.  This was not successful due to poor bone quality.  Additionally, small guidepin was then placed into the femoral shaft in order to facilitate motion, but it did not work better than the hammer in the appropriate location.  With the guidepin in what appeared to be good location, proximal reaming was performed with care being taken to achieve a more medial trajectory in order to avoid going more lateral.  This was done with the tissue protector. Once that proximal portion was reamed over a guidepin, the fracture reducer was placed and with maneuvering of the distal fragment, the guidepin was placed across the fracture, which maintained reduction. Reaming was then performed up to 13 mm.  Proximal reaming was completed with a 16-mm reamer again always being mindful to push medially with the tissue protector.  Once the reaming had been performed, the nail was measured and it was placed.  The lag and compression screws were then placed and some compression of the fragment  was achieved.  Again matching the external rotation of the proximal fragment.  For the distal interlocking screws, the foot was placed perpendicular to the floor. Two distal interlocking screws were then placed with good purchase obtained.  At this time, all incisions were thoroughly irrigated and closed using combination of 0 Vicryl, 2-0 Vicryl and 3-0 nylon.  Aquacel dressing was placed.  The patient tolerated the procedure well without immediate complications, transferred to the recovery room in stable condition.     Anderson Malta, M.D.     GSD/MEDQ  D:  01/01/2017  T:  01/01/2017  Job:  [id number]

## 2017-01-03 NOTE — Progress Notes (Signed)
PT has evaluated pt. And is recommending IP Rehab.  Patient was screened by Mark [name] for appropriateness for an Inpatient Acute Rehab consult.  At this time, we are recommending Inpatient Rehab consult.  Please order consult if you are agreeable.  [facility] Admissions Coordinator Cell [phone] Office [phone]

## 2017-01-03 NOTE — Evaluation (Signed)
Occupational Therapy Evaluation Patient Details Name: Mark [name] MRN: [id number] DOB: Jan 11, 1951 Today's Date: 01/03/2017    History of Present Illness Pt is a 66 yo male with c/o R hip pain s/p fall of 8 foot ladder, dx R hip fx s/p intertrochanteric intramedullary nail fixation 01/01/17. PMH significant for L BKA (2014), DM2, CKD stage 3, HTN and CHF.   Clinical Impression   PTA, pt was living with his wife and was independent. Pt with BKA and has been using a prosthetic for four years stating that he was independent in donning/doffing and using his prosthetic. Currently, pt requires Mod A for UB ADLs and Max A +2 for LB ADLs due to decreased balance and significant pain. Pt is motivated to participate in therapy and return to his PLOF. Pt would benefit from acute OT to increase his safety and optimize his independence. Recommend dc to CIR for intense OT to increase his independence prior to transitioning home as well as decrease caregiver burden.     Follow Up Recommendations  CIR    Equipment Recommendations  Other (comment) (Defer to next venue)    Recommendations for Other Services Rehab consult     Precautions / Restrictions Precautions Precautions: Fall Required Braces or Orthoses:  (L BKA prosthetic) Restrictions Weight Bearing Restrictions: Yes RLE Weight Bearing: Weight bearing as tolerated      Mobility Bed Mobility Overal bed mobility: Needs Assistance Bed Mobility: Supine to Sit     Supine to sit: Max assist;+2 for physical assistance     General bed mobility comments: Pt required Max A to transition BLE and support trunk into sitting positioning. Pt had difficulty transitioning into upright position due to pain in RLE and L BKA  Transfers Overall transfer level: Needs assistance Equipment used: Rolling walker (2 wheeled) Transfers: Stand Pivot Transfers   Stand pivot transfers: Max assist;+2 physical assistance       General transfer comment: Pt used  prosthetic on LLE. Prosthetic did not lock in standing but pt able to pivoted on LLE to transition to recliner with Max A +2 and RW    Balance Overall balance assessment: Needs assistance Sitting-balance support: Feet supported;Bilateral upper extremity supported Sitting balance-Leahy Scale: Poor Sitting balance - Comments: Pt required Mod A to maintain sitting balance at EOB due to signfificant pain in RLE and BKA at LLE decreasing his balance   Standing balance support: During functional activity;Bilateral upper extremity supported Standing balance-Leahy Scale: Poor Standing balance comment: Required Max A +2 for standing balance                           ADL either performed or assessed with clinical judgement   ADL Overall ADL's : Needs assistance/impaired Eating/Feeding: Set up;Sitting Eating/Feeding Details (indicate cue type and reason): Pt is set up when seated in a supported chair or at bed level. At EOB, pt required Mod A to maintain sitting posture Grooming: Moderate assistance;Sitting Grooming Details (indicate cue type and reason): Pt required Mod A to maintain sitting at EOB  Upper Body Bathing: Moderate assistance;Sitting   Lower Body Bathing: Maximal assistance;Sit to/from stand;+2 for physical assistance   Upper Body Dressing : Moderate assistance;Sitting   Lower Body Dressing: Maximal assistance;Sit to/from stand;+2 for physical assistance   Toilet Transfer: Maximal assistance;+2 for physical assistance;Stand-pivot Toilet Transfer Details (indicate cue type and reason): simulated to recliner Toileting- Clothing Manipulation and Hygiene: Maximal assistance;Sit to/from stand  General ADL Comments: Pt is currently limited by significant pain. However, is very motiviated to participate in OT to return to PLOF.      Vision         Perception     Praxis      Pertinent Vitals/Pain Pain Assessment: Faces Faces Pain Scale: Hurts even  more Pain Location: R hip and leg Pain Descriptors / Indicators: Aching;Constant;Grimacing;Guarding Pain Intervention(s): Monitored during session;RN gave pain meds during session;Repositioned;Limited activity within patient's tolerance     Hand Dominance Right   Extremity/Trunk Assessment Upper Extremity Assessment Upper Extremity Assessment: Defer to OT evaluation   Lower Extremity Assessment Lower Extremity Assessment: LLE deficits/detail;RLE deficits/detail RLE Deficits / Details: R hip and knee ROM and strength limited by surgical pain. R ankle ROM WFL and strength grossly 4/5  LLE Deficits / Details: L BKA, hip and knee strength grossly 4/5   Cervical / Trunk Assessment Cervical / Trunk Assessment: Normal   Communication     Cognition Arousal/Alertness: Awake/alert Behavior During Therapy: WFL for tasks assessed/performed Overall Cognitive Status: Within Functional Limits for tasks assessed                                     General Comments  Pt had prosthetic during session and donned to increase standing balance    Exercises     Shoulder Instructions      Home Living Family/patient expects to be discharged to:: Private residence Living Arrangements: Spouse/significant other Available Help at Discharge: Family;Available PRN/intermittently Type of Home: House Home Access: Stairs to enter [employer] of Steps: 1 Entrance Stairs-Rails: None Home Layout: One level     Bathroom Shower/Tub: Occupational psychologist: Standard Bathroom Accessibility: Yes How Accessible: Accessible via walker Home Equipment: [place] - 2 wheels;Shower seat;Adaptive equipment;Hand held shower head Adaptive Equipment: [place]        Prior Functioning/Environment Level of Independence: Independent                 OT Problem List: Decreased strength;Decreased activity tolerance;Decreased range of motion;Impaired balance (sitting and/or  standing);Decreased knowledge of use of DME or AE;Pain      OT Treatment/Interventions: Self-care/ADL training;Energy conservation;DME and/or AE instruction;Therapeutic exercise;Therapeutic activities;Patient/family education    OT Goals(Current goals can be found in the care plan section) Acute Rehab OT Goals Patient Stated Goal: have less pain OT Goal Formulation: With patient Time For Goal Achievement: 01/17/17 Potential to Achieve Goals: Good ADL Goals Pt Will Perform Grooming: with max assist;standing Pt Will Perform Upper Body Dressing: with min guard assist;sitting Pt Will Perform Lower Body Dressing: with adaptive equipment;with mod assist;sit to/from stand Pt Will Transfer to Toilet: with mod assist;ambulating;bedside commode  OT Frequency: Min 2X/week   Barriers to D/C:            Co-evaluation PT/OT/SLP Co-Evaluation/Treatment: Yes   PT goals addressed during session: Mobility/safety with mobility;Proper use of DME;Strengthening/ROM OT goals addressed during session: ADL's and self-care      AM-PAC PT "6 Clicks" Daily Activity     Outcome Measure Help from another person eating meals?: None Help from another person taking care of personal grooming?: A Little Help from another person toileting, which includes using toliet, bedpan, or urinal?: A Lot Help from another person bathing (including washing, rinsing, drying)?: A Lot Help from another person to put on and taking off regular upper body clothing?: A Lot Help  from another person to put on and taking off regular lower body clothing?: A Lot 6 Click Score: 15   End of Session Equipment Utilized During Treatment: Gait belt;Rolling walker;Other (comment) (Prosthetic) Nurse Communication: Mobility status;Patient requests pain meds  Activity Tolerance: Patient tolerated treatment well;Patient limited by pain Patient left: in chair;with call bell/phone within reach  OT Visit Diagnosis: Unsteadiness on feet  (R26.81);Muscle weakness (generalized) (M62.81);Pain;Other abnormalities of gait and mobility (R26.89) Pain - Right/Left: Right Pain - part of body: Leg                Time: 1610-9604 OT Time Calculation (min): 36 min Charges:  OT General Charges $OT Visit: 1 Procedure OT Evaluation $OT Eval Moderate Complexity: 1 Procedure G-Codes:     [place], OTR/L [place] 01/03/2017, 9:26 AM

## 2017-01-04 ENCOUNTER — Encounter (HOSPITAL_COMMUNITY): Payer: Self-pay | Admitting: Orthopedic Surgery

## 2017-01-04 LAB — BASIC METABOLIC PANEL
Anion gap: 8 (ref 5–15)
BUN: 48 mg/dL — ABNORMAL HIGH (ref 6–20)
CO2: 19 mmol/L — ABNORMAL LOW (ref 22–32)
Calcium: 8.1 mg/dL — ABNORMAL LOW (ref 8.9–10.3)
Chloride: 112 mmol/L — ABNORMAL HIGH (ref 101–111)
Creatinine, Ser: 2.66 mg/dL — ABNORMAL HIGH (ref 0.61–1.24)
GFR calc Af Amer: 27 mL/min — ABNORMAL LOW (ref >60)
GFR calc non Af Amer: 24 mL/min — ABNORMAL LOW (ref >60)
Glucose, Bld: 145 mg/dL — ABNORMAL HIGH (ref 65–99)
Potassium: 4.6 mmol/L (ref 3.5–5.1)
Sodium: 139 mmol/L (ref 135–145)

## 2017-01-04 LAB — CBC
HCT: 22.9 % — ABNORMAL LOW (ref 39.0–52.0)
Hemoglobin: 7.4 g/dL — ABNORMAL LOW (ref 13.0–17.0)
MCH: 28.5 pg (ref 26.0–34.0)
MCHC: 32.3 g/dL (ref 30.0–36.0)
MCV: 88.1 fL (ref 78.0–100.0)
Platelets: 248 10*3/uL (ref 150–400)
RBC: 2.6 MIL/uL — ABNORMAL LOW (ref 4.22–5.81)
RDW: 14.5 % (ref 11.5–15.5)
WBC: 17 10*3/uL — ABNORMAL HIGH (ref 4.0–10.5)

## 2017-01-04 LAB — PROTIME-INR
INR: 1.23
Prothrombin Time: 15.6 seconds — ABNORMAL HIGH (ref 11.4–15.2)

## 2017-01-04 LAB — GLUCOSE, CAPILLARY
Glucose-Capillary: 127 mg/dL — ABNORMAL HIGH (ref 65–99)
Glucose-Capillary: 184 mg/dL — ABNORMAL HIGH (ref 65–99)
Glucose-Capillary: 193 mg/dL — ABNORMAL HIGH (ref 65–99)
Glucose-Capillary: 237 mg/dL — ABNORMAL HIGH (ref 65–99)

## 2017-01-04 MED ORDER — MORPHINE SULFATE (PF) 4 MG/ML IV SOLN
1.0000 mg | INTRAVENOUS | Status: DC | PRN
  Administered 2017-01-04: 1 mg via INTRAVENOUS
  Administered 2017-01-04 – 2017-01-05 (×4): 2 mg via INTRAVENOUS
  Filled 2017-01-04 (×5): qty 1

## 2017-01-04 MED ORDER — DOXYCYCLINE HYCLATE 100 MG PO TABS
100.0000 mg | ORAL_TABLET | Freq: Two times a day (BID) | ORAL | Status: DC
  Administered 2017-01-04 – 2017-01-05 (×3): 100 mg via ORAL
  Filled 2017-01-04 (×3): qty 1

## 2017-01-04 MED ORDER — HYDROCODONE-ACETAMINOPHEN 5-325 MG PO TABS
1.0000 | ORAL_TABLET | Freq: Four times a day (QID) | ORAL | Status: DC | PRN
  Administered 2017-01-04 – 2017-01-05 (×4): 2 via ORAL
  Filled 2017-01-04 (×4): qty 2

## 2017-01-04 MED ORDER — SENNOSIDES-DOCUSATE SODIUM 8.6-50 MG PO TABS
1.0000 | ORAL_TABLET | Freq: Two times a day (BID) | ORAL | Status: DC
  Administered 2017-01-04 – 2017-01-05 (×3): 1 via ORAL
  Filled 2017-01-04 (×3): qty 1

## 2017-01-04 MED ORDER — POLYETHYLENE GLYCOL 3350 17 G PO PACK
17.0000 g | PACK | Freq: Every day | ORAL | Status: DC | PRN
  Administered 2017-01-04: 17 g via ORAL
  Filled 2017-01-04: qty 1

## 2017-01-04 NOTE — Clinical Social Work Note (Signed)
Clinical Social Work Assessment  Patient Details  Name: Mark [name] MRN: [id number] Date of Birth: 01-26-1951  Date of referral:  01/04/17               Reason for consult:  Facility Placement                Permission sought to share information with:  Facility Art therapist granted to share information::  Yes, Verbal Permission Granted  Name::        Agency::  SNF  Relationship::     Contact Information:     Housing/Transportation Living arrangements for the past 2 months:  Single Family Home Source of Information:  Patient Patient Interpreter Needed:  None Criminal Activity/Legal Involvement Pertinent to Current Situation/Hospitalization:  No - Comment as needed Significant Relationships:  Adult Children, Spouse Lives with:  Spouse Do you feel safe going back to the place where you live?  No Need for family participation in patient care:  No (Coment)  Care giving concerns:  Patient was hospitalized after falling off a ladder. Patient resides with spouse in a single family dwelling. Patient indicated that he was independent with ADL's prior to hospitalization placement.  Patient not safe to return home at this time and cannot be cared for by spouse.  Social Worker assessment / plan:  CSW met with patient at bedside to discuss DC recommendations from PT.  CSW explained her role and SNF options and placement.  CSW obtained verbal permission to send out offers to [facility] however pt indicated that he has selected [facility] as it is close to home. Patient indicated that he wld contact spouse on his own.    Offers sent to [facility].  Employment status:  Retired Nurse, adult PT Recommendations:  [place] / Referral to community resources:  [place]  Patient/Family's Response to care:  Patient appreciative of CSW assistance with placement. No issues reported at this  time.  Patient/Family's Understanding of and Emotional Response to Diagnosis, Current Treatment, and Prognosis:  Patient has good understanding of his diagnosis, current treatment, and prognosis. He is agreeable with the SNF placement and reports no issues or concerns at this time.  Emotional Assessment Appearance:  Appears stated age Attitude/Demeanor/Rapport:   (Cooperative) Affect (typically observed):  Accepting, Appropriate Orientation:  Oriented to Self, Oriented to Situation Alcohol / Substance use:  Not Applicable Psych involvement (Current and /or in the community):  No (Comment)  Discharge Needs  Concerns to be addressed:  Care Coordination Readmission within the last 30 days:  No Current discharge risk:  Physical Impairment, Dependent with Mobility Barriers to Discharge:  No Barriers Identified   Normajean Baxter, LCSW 01/04/2017, 10:42 AM

## 2017-01-04 NOTE — Progress Notes (Signed)
Pt stable Slow to mobilize snf tomorrow rx on chart for pain meds and lovenox

## 2017-01-04 NOTE — Progress Notes (Signed)
Physical Therapy Treatment Patient Details Name: Mark [name] MRN: [id number] DOB: 1951/08/18 Today's Date: 01/04/2017    History of Present Illness Pt is a 66 yo male with c/o R hip pain s/p fall of 8 foot ladder, dx R hip fx s/p intertrochanteric intramedullary nail fixation 01/01/17. PMH significant for L BKA (2014), DM2, CKD stage 3, HTN and CHF.    PT Comments    Mod - max +2 physical assist required for bed mobilities. Pt does better rolling to the L than the R. Unable to don prosthesis as LLE is swollen. Advised pt to have shrinkers brought to the hospital. He states no one can bring them today. Attempted sit<>stand with steady but unable to perform. Transferred to chair via maximove. RN advised pt should be returned to bed after 1hr. Patient would benefit from continued skilled PT. Will continue to follow acutely.     Follow Up Recommendations  CIR     Equipment Recommendations  Other (comment) (TBD at next venue)    Recommendations for Other Services Rehab consult     Precautions / Restrictions Precautions Precautions: Fall Required Braces or Orthoses:  (L BKA prosthetic) Restrictions Weight Bearing Restrictions: Yes RLE Weight Bearing: Weight bearing as tolerated (for transfers only)    Mobility  Bed Mobility Overal bed mobility: Needs Assistance Bed Mobility: Rolling Rolling: Max assist;Mod assist;+2 for physical assistance (x2)   Supine to sit: Max assist;+2 for physical assistance (x2)     General bed mobility comments: Max A to roll to the R. Mod A to roll left. Max A for supine to sit for LE management, to progress hips, and to elevate trunk into upright position. Once seated pt leans heavily to the L to avoid putting weight on R hip.  Transfers Overall transfer level: Needs assistance               General transfer comment: Attempted sit<>stand using the steady, however pt unable to power up into standing and did not have a pad under hips to allow  more assist from PTA and tech. Determined maximove would be best for pt safety.  Ambulation/Gait                 Stairs            Wheelchair Mobility    Modified Rankin (Stroke Patients Only)       Balance Overall balance assessment: Needs assistance Sitting-balance support: Feet supported Sitting balance-Leahy Scale: Poor Sitting balance - Comments: pt able to sit edge of recliner with minA    Standing balance support: During functional activity;Bilateral upper extremity supported Standing balance-Leahy Scale: Poor Standing balance comment: Required Max A +2 for standing balance                            Cognition Arousal/Alertness: Awake/alert Behavior During Therapy: WFL for tasks assessed/performed Overall Cognitive Status: Within Functional Limits for tasks assessed                                        Exercises      General Comments General comments (skin integrity, edema, etc.): Unable to don prosthesis due to swelling in the L LE. Advised pt to have shrinkers brought to hospital.      Pertinent Vitals/Pain Pain Assessment: 0-10 Pain Score: 8  Pain Location: R hip  and leg Pain Descriptors / Indicators: Aching;Constant;Grimacing;Guarding;Sore;Shooting    Home Living                      Prior Function            PT Goals (current goals can now be found in the care plan section) Acute Rehab PT Goals Patient Stated Goal: have less pain PT Goal Formulation: With patient Time For Goal Achievement: 01/17/17 Potential to Achieve Goals: Good    Frequency    Min 5X/week      PT Plan Current plan remains appropriate    Co-evaluation              AM-PAC PT "6 Clicks" Daily Activity  Outcome Measure  Difficulty turning over in bed (including adjusting bedclothes, sheets and blankets)?: Total Difficulty moving from lying on back to sitting on the side of the bed? : Total Difficulty sitting  down on and standing up from a chair with arms (e.g., wheelchair, bedside commode, etc,.)?: Total Help needed moving to and from a bed to chair (including a wheelchair)?: A Lot Help needed walking in hospital room?: Total Help needed climbing 3-5 steps with a railing? : Total 6 Click Score: 7    End of Session Equipment Utilized During Treatment: Gait belt Activity Tolerance: Patient limited by pain Patient left: in chair;with call bell/phone within reach Nurse Communication: Mobility status;Need for lift equipment;Patient requests pain meds;Precautions PT Visit Diagnosis: Unsteadiness on feet (R26.81);Other abnormalities of gait and mobility (R26.89);History of falling (Z91.81);Pain;Muscle weakness (generalized) (M62.81) Pain - Right/Left: Left Pain - part of body: Hip     Time: 3358-2518 PT Time Calculation (min) (ACUTE ONLY): 63 min  Charges:  $Therapeutic Activity: 53-67 mins                    G Codes:       Benjiman Core, PTA Acute Rehab [phone]   Allena Katz 01/04/2017, 2:47 PM
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
## 2017-01-04 NOTE — Social Work (Addendum)
Patient has selected [facility] [place] for short term rehabilitation.  CSW spoke with wife and advised her of SNF selection. She is in agreement. CSW will f/u for DC.  DC to SNF. RN made LCSW aware patient is medically stable. Information sent to facility.  Agreeable to accept. Will transport by EMS.  Elissa Hefty, LCSW Clinical Social Worker [phone]

## 2017-01-04 NOTE — Care Management Important Message (Signed)
Important Message  Patient Details  Name: Mark [name] MRN: [id number] Date of Birth: 07-Nov-1950   Medicare Important Message Given:  Yes    Orbie Pyo 01/04/2017, 11:25 AM

## 2017-01-04 NOTE — Clinical Social Work Placement (Addendum)
   CLINICAL SOCIAL WORK PLACEMENT  NOTE  Date:  01/04/2017  Patient Details  Name: KAHIAU SCHEWE MRN: [id number] Date of Birth: 1951-08-25  Clinical Social Work is seeking post-discharge placement for this patient at the [place] level of care (*CSW will initial, date and re-position this form in  chart as items are completed):  Yes   Patient/family provided with [facility] Work Department's list of facilities offering this level of care within the geographic area requested by the patient (or if unable, by the patient's family).  Yes   Patient/family informed of their freedom to choose among providers that offer the needed level of care, that participate in Medicare, Medicaid or managed care program needed by the patient, have an available bed and are willing to accept the patient.  Yes   Patient/family informed of [facility]'s ownership interest in [facility] and [facility], as well as of the fact that they are under no obligation to receive care at these facilities.  PASRR submitted to EDS on       PASRR number received on 01/03/17     Existing PASRR number confirmed on 01/03/17     FL2 transmitted to all facilities in geographic area requested by pt/family on 01/03/17     FL2 transmitted to all facilities within larger geographic area on 01/03/17     Patient informed that his/her managed care company has contracts with or will negotiate with certain facilities, including the following:        Yes   Patient/family informed of bed offers received.  Patient chooses bed at [facility]     Physician recommends and patient chooses bed at      Patient to be transferred to [facility] on 01/04/17.  Patient to be transferred to facility by [facility]     Patient family notified on         of transfer.  Name of family member notified:  Patient indicated he would call spouse     PHYSICIAN Please prepare priority discharge  summary, including medications, Please prepare prescriptions, Please sign FL2     Additional Comment:    _______________________________________________ Normajean Baxter, LCSW 01/04/2017, 10:56 AM

## 2017-01-05 DIAGNOSIS — N183 Chronic kidney disease, stage 3 (moderate): Secondary | ICD-10-CM | POA: Diagnosis not present

## 2017-01-05 DIAGNOSIS — N179 Acute kidney failure, unspecified: Secondary | ICD-10-CM | POA: Diagnosis not present

## 2017-01-05 DIAGNOSIS — Z471 Aftercare following joint replacement surgery: Secondary | ICD-10-CM | POA: Diagnosis not present

## 2017-01-05 DIAGNOSIS — Z4789 Encounter for other orthopedic aftercare: Secondary | ICD-10-CM | POA: Diagnosis not present

## 2017-01-05 DIAGNOSIS — D62 Acute posthemorrhagic anemia: Secondary | ICD-10-CM | POA: Diagnosis not present

## 2017-01-05 DIAGNOSIS — E1159 Type 2 diabetes mellitus with other circulatory complications: Secondary | ICD-10-CM | POA: Diagnosis not present

## 2017-01-05 DIAGNOSIS — R2689 Other abnormalities of gait and mobility: Secondary | ICD-10-CM | POA: Diagnosis not present

## 2017-01-05 DIAGNOSIS — D473 Essential (hemorrhagic) thrombocythemia: Secondary | ICD-10-CM | POA: Diagnosis not present

## 2017-01-05 DIAGNOSIS — I10 Essential (primary) hypertension: Secondary | ICD-10-CM | POA: Diagnosis not present

## 2017-01-05 DIAGNOSIS — S72001A Fracture of unspecified part of neck of right femur, initial encounter for closed fracture: Secondary | ICD-10-CM | POA: Diagnosis not present

## 2017-01-05 DIAGNOSIS — E785 Hyperlipidemia, unspecified: Secondary | ICD-10-CM | POA: Diagnosis not present

## 2017-01-05 DIAGNOSIS — R609 Edema, unspecified: Secondary | ICD-10-CM | POA: Diagnosis not present

## 2017-01-05 DIAGNOSIS — E1151 Type 2 diabetes mellitus with diabetic peripheral angiopathy without gangrene: Secondary | ICD-10-CM | POA: Diagnosis not present

## 2017-01-05 DIAGNOSIS — M6281 Muscle weakness (generalized): Secondary | ICD-10-CM | POA: Diagnosis not present

## 2017-01-05 DIAGNOSIS — D649 Anemia, unspecified: Secondary | ICD-10-CM | POA: Diagnosis not present

## 2017-01-05 DIAGNOSIS — Z89512 Acquired absence of left leg below knee: Secondary | ICD-10-CM | POA: Diagnosis not present

## 2017-01-05 DIAGNOSIS — S72001D Fracture of unspecified part of neck of right femur, subsequent encounter for closed fracture with routine healing: Secondary | ICD-10-CM | POA: Diagnosis not present

## 2017-01-05 DIAGNOSIS — I5032 Chronic diastolic (congestive) heart failure: Secondary | ICD-10-CM | POA: Diagnosis not present

## 2017-01-05 LAB — GLUCOSE, CAPILLARY
Glucose-Capillary: 138 mg/dL — ABNORMAL HIGH (ref 65–99)
Glucose-Capillary: 160 mg/dL — ABNORMAL HIGH (ref 65–99)

## 2017-01-05 LAB — CBC
HCT: 21.6 % — ABNORMAL LOW (ref 39.0–52.0)
Hemoglobin: 6.8 g/dL — CL (ref 13.0–17.0)
MCH: 28.1 pg (ref 26.0–34.0)
MCHC: 31.5 g/dL (ref 30.0–36.0)
MCV: 89.3 fL (ref 78.0–100.0)
Platelets: 280 10*3/uL (ref 150–400)
RBC: 2.42 MIL/uL — ABNORMAL LOW (ref 4.22–5.81)
RDW: 14.4 % (ref 11.5–15.5)
WBC: 13.1 10*3/uL — ABNORMAL HIGH (ref 4.0–10.5)

## 2017-01-05 LAB — BASIC METABOLIC PANEL
Anion gap: 8 (ref 5–15)
BUN: 46 mg/dL — ABNORMAL HIGH (ref 6–20)
CO2: 21 mmol/L — ABNORMAL LOW (ref 22–32)
Calcium: 8.1 mg/dL — ABNORMAL LOW (ref 8.9–10.3)
Chloride: 112 mmol/L — ABNORMAL HIGH (ref 101–111)
Creatinine, Ser: 2.42 mg/dL — ABNORMAL HIGH (ref 0.61–1.24)
GFR calc Af Amer: 31 mL/min — ABNORMAL LOW (ref >60)
GFR calc non Af Amer: 26 mL/min — ABNORMAL LOW (ref >60)
Glucose, Bld: 151 mg/dL — ABNORMAL HIGH (ref 65–99)
Potassium: 4.4 mmol/L (ref 3.5–5.1)
Sodium: 141 mmol/L (ref 135–145)

## 2017-01-05 LAB — PROTIME-INR
INR: 1.08
Prothrombin Time: 14.1 seconds (ref 11.4–15.2)

## 2017-01-05 LAB — HEMOGLOBIN AND HEMATOCRIT, BLOOD
HCT: 24.7 % — ABNORMAL LOW (ref 39.0–52.0)
Hemoglobin: 8 g/dL — ABNORMAL LOW (ref 13.0–17.0)

## 2017-01-05 LAB — PREPARE RBC (CROSSMATCH)

## 2017-01-05 MED ORDER — METHOCARBAMOL 500 MG PO TABS: 500.0000 mg | ORAL_TABLET | Freq: Three times a day (TID) | ORAL | 0 refills | Status: DC | PRN

## 2017-01-05 MED ORDER — ENOXAPARIN SODIUM 40 MG/0.4ML ~~LOC~~ SOLN: 40.0000 mg | SUBCUTANEOUS | Status: DC

## 2017-01-05 MED ORDER — INSULIN ASPART 100 UNIT/ML ~~LOC~~ SOLN: 0.0000 [IU] | Freq: Three times a day (TID) | SUBCUTANEOUS | 11 refills | Status: DC

## 2017-01-05 MED ORDER — INSULIN GLARGINE 100 UNIT/ML SOLOSTAR PEN: 10.0000 [IU] | PEN_INJECTOR | Freq: Every day | SUBCUTANEOUS | 3 refills | Status: DC

## 2017-01-05 MED ORDER — POLYETHYLENE GLYCOL 3350 17 G PO PACK: 17.0000 g | PACK | Freq: Every day | ORAL | 0 refills | Status: DC | PRN

## 2017-01-05 MED ORDER — HYDROCODONE-ACETAMINOPHEN 5-325 MG PO TABS: 1.0000 | ORAL_TABLET | Freq: Four times a day (QID) | ORAL | 0 refills | Status: DC | PRN

## 2017-01-05 MED ORDER — ENOXAPARIN SODIUM 40 MG/0.4ML ~~LOC~~ SOLN: 40.0000 mg | Freq: Every day | SUBCUTANEOUS | Status: DC

## 2017-01-05 MED ORDER — SODIUM CHLORIDE 0.9 % IV SOLN
510.0000 mg | Freq: Once | INTRAVENOUS | Status: AC
  Administered 2017-01-05: 510 mg via INTRAVENOUS
  Filled 2017-01-05: qty 17

## 2017-01-05 MED ORDER — METHOCARBAMOL 500 MG PO TABS: 500.0000 mg | ORAL_TABLET | Freq: Three times a day (TID) | ORAL | Status: DC | PRN

## 2017-01-05 MED ORDER — SENNOSIDES-DOCUSATE SODIUM 8.6-50 MG PO TABS: 1.0000 | ORAL_TABLET | Freq: Two times a day (BID) | ORAL | Status: DC

## 2017-01-05 MED ORDER — DOXYCYCLINE HYCLATE 100 MG PO TABS: 100.0000 mg | ORAL_TABLET | Freq: Two times a day (BID) | ORAL | Status: DC

## 2017-01-05 MED ORDER — ACETAMINOPHEN 325 MG PO TABS: 650.0000 mg | ORAL_TABLET | Freq: Four times a day (QID) | ORAL | Status: DC | PRN

## 2017-01-05 MED ORDER — SODIUM CHLORIDE 0.9 % IV SOLN
Freq: Once | INTRAVENOUS | Status: AC
  Administered 2017-01-05: 10:00:00 via INTRAVENOUS
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
## 2017-01-06 LAB — BPAM RBC
Blood Product Expiration Date: 201805302359
Blood Product Expiration Date: 201805302359
Blood Product Expiration Date: 201805302359
ISSUE DATE / TIME: 201805060819
ISSUE DATE / TIME: 201805061510
ISSUE DATE / TIME: 201805090900
Unit Type and Rh: 5100
Unit Type and Rh: 5100
Unit Type and Rh: 5100

## 2017-01-06 LAB — TYPE AND SCREEN
ABO/RH(D): O POS
Antibody Screen: NEGATIVE
Unit division: 0
Unit division: 0
Unit division: 0

## 2017-01-07 ENCOUNTER — Encounter (HOSPITAL_COMMUNITY): Payer: Self-pay | Admitting: Orthopedic Surgery

## 2017-01-07 DIAGNOSIS — E1151 Type 2 diabetes mellitus with diabetic peripheral angiopathy without gangrene: Secondary | ICD-10-CM | POA: Diagnosis not present

## 2017-01-07 DIAGNOSIS — I10 Essential (primary) hypertension: Secondary | ICD-10-CM | POA: Diagnosis not present

## 2017-01-07 DIAGNOSIS — N183 Chronic kidney disease, stage 3 (moderate): Secondary | ICD-10-CM | POA: Diagnosis not present

## 2017-01-07 DIAGNOSIS — Z471 Aftercare following joint replacement surgery: Secondary | ICD-10-CM | POA: Diagnosis not present

## 2017-01-09 LAB — CULTURE, BLOOD (ROUTINE X 2)
Culture: NO GROWTH
Culture: NO GROWTH
Special Requests: ADEQUATE
Special Requests: ADEQUATE

## 2017-01-12 ENCOUNTER — Encounter: Payer: Self-pay | Admitting: Family Medicine

## 2017-01-18 DIAGNOSIS — N183 Chronic kidney disease, stage 3 (moderate): Secondary | ICD-10-CM | POA: Diagnosis not present

## 2017-01-18 DIAGNOSIS — I5032 Chronic diastolic (congestive) heart failure: Secondary | ICD-10-CM | POA: Diagnosis not present

## 2017-01-18 DIAGNOSIS — D473 Essential (hemorrhagic) thrombocythemia: Secondary | ICD-10-CM | POA: Diagnosis not present

## 2017-01-18 DIAGNOSIS — Z4789 Encounter for other orthopedic aftercare: Secondary | ICD-10-CM | POA: Diagnosis not present

## 2017-01-18 DIAGNOSIS — R609 Edema, unspecified: Secondary | ICD-10-CM | POA: Diagnosis not present

## 2017-01-20 ENCOUNTER — Encounter (INDEPENDENT_AMBULATORY_CARE_PROVIDER_SITE_OTHER): Payer: Self-pay | Admitting: Orthopedic Surgery

## 2017-01-20 ENCOUNTER — Ambulatory Visit (INDEPENDENT_AMBULATORY_CARE_PROVIDER_SITE_OTHER): Payer: Medicare Other

## 2017-01-20 ENCOUNTER — Ambulatory Visit (INDEPENDENT_AMBULATORY_CARE_PROVIDER_SITE_OTHER): Payer: Medicare Other | Admitting: Orthopedic Surgery

## 2017-01-20 DIAGNOSIS — Z89512 Acquired absence of left leg below knee: Secondary | ICD-10-CM | POA: Diagnosis not present

## 2017-01-20 DIAGNOSIS — S72001A Fracture of unspecified part of neck of right femur, initial encounter for closed fracture: Secondary | ICD-10-CM | POA: Diagnosis not present

## 2017-01-20 DIAGNOSIS — N183 Chronic kidney disease, stage 3 (moderate): Secondary | ICD-10-CM | POA: Diagnosis not present

## 2017-01-20 DIAGNOSIS — S72001D Fracture of unspecified part of neck of right femur, subsequent encounter for closed fracture with routine healing: Secondary | ICD-10-CM | POA: Diagnosis not present

## 2017-01-20 DIAGNOSIS — E1159 Type 2 diabetes mellitus with other circulatory complications: Secondary | ICD-10-CM | POA: Diagnosis not present
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
## 2017-01-24 DIAGNOSIS — E1151 Type 2 diabetes mellitus with diabetic peripheral angiopathy without gangrene: Secondary | ICD-10-CM | POA: Diagnosis not present

## 2017-01-24 DIAGNOSIS — I509 Heart failure, unspecified: Secondary | ICD-10-CM | POA: Diagnosis not present

## 2017-01-24 DIAGNOSIS — I130 Hypertensive heart and chronic kidney disease with heart failure and stage 1 through stage 4 chronic kidney disease, or unspecified chronic kidney disease: Secondary | ICD-10-CM | POA: Diagnosis not present

## 2017-01-24 DIAGNOSIS — E785 Hyperlipidemia, unspecified: Secondary | ICD-10-CM | POA: Diagnosis not present

## 2017-01-24 DIAGNOSIS — W11XXXD Fall on and from ladder, subsequent encounter: Secondary | ICD-10-CM | POA: Diagnosis not present

## 2017-01-24 DIAGNOSIS — E1122 Type 2 diabetes mellitus with diabetic chronic kidney disease: Secondary | ICD-10-CM | POA: Diagnosis not present

## 2017-01-24 DIAGNOSIS — Z89512 Acquired absence of left leg below knee: Secondary | ICD-10-CM | POA: Diagnosis not present

## 2017-01-24 DIAGNOSIS — N183 Chronic kidney disease, stage 3 (moderate): Secondary | ICD-10-CM | POA: Diagnosis not present

## 2017-01-24 DIAGNOSIS — Z794 Long term (current) use of insulin: Secondary | ICD-10-CM | POA: Diagnosis not present

## 2017-01-24 DIAGNOSIS — Z9181 History of falling: Secondary | ICD-10-CM | POA: Diagnosis not present

## 2017-01-24 DIAGNOSIS — S72001D Fracture of unspecified part of neck of right femur, subsequent encounter for closed fracture with routine healing: Secondary | ICD-10-CM | POA: Diagnosis not present

## 2017-01-25 ENCOUNTER — Telehealth: Payer: Self-pay | Admitting: Family Medicine

## 2017-01-25 ENCOUNTER — Telehealth (INDEPENDENT_AMBULATORY_CARE_PROVIDER_SITE_OTHER): Payer: Self-pay | Admitting: Orthopedic Surgery

## 2017-01-25 DIAGNOSIS — I10 Essential (primary) hypertension: Secondary | ICD-10-CM

## 2017-01-25 DIAGNOSIS — E1100 Type 2 diabetes mellitus with hyperosmolarity without nonketotic hyperglycemic-hyperosmolar coma (NKHHC): Secondary | ICD-10-CM

## 2017-01-25 NOTE — Telephone Encounter (Signed)
Is this okay?

## 2017-01-25 NOTE — Telephone Encounter (Signed)
Caller states pt was just discharged from skilled care for a hip fracture. Caller calling to get info for insulin for pt. Discharge paperwork states one dosage and pt using different dosage to control sugars. Sugar was 154 yesterday. Discharge paperwork removed furosemide from pt regimen and pt is having swelling.Osvaldo Human requesting a return call to verify dosage prescribed by Dr Adrian Blackwater.

## 2017-01-25 NOTE — Telephone Encounter (Signed)
Y thx

## 2017-01-25 NOTE — Telephone Encounter (Signed)
Mary from [facility] called asking for 6 visit for PT for the patient. Wanted a social work evaluation and also a Microbiologist. CB # [phone]

## 2017-01-26 DIAGNOSIS — I130 Hypertensive heart and chronic kidney disease with heart failure and stage 1 through stage 4 chronic kidney disease, or unspecified chronic kidney disease: Secondary | ICD-10-CM | POA: Diagnosis not present

## 2017-01-26 DIAGNOSIS — Z794 Long term (current) use of insulin: Secondary | ICD-10-CM | POA: Diagnosis not present

## 2017-01-26 DIAGNOSIS — E1122 Type 2 diabetes mellitus with diabetic chronic kidney disease: Secondary | ICD-10-CM | POA: Diagnosis not present

## 2017-01-26 DIAGNOSIS — Z89512 Acquired absence of left leg below knee: Secondary | ICD-10-CM | POA: Diagnosis not present

## 2017-01-26 DIAGNOSIS — E785 Hyperlipidemia, unspecified: Secondary | ICD-10-CM | POA: Diagnosis not present

## 2017-01-26 DIAGNOSIS — Z9181 History of falling: Secondary | ICD-10-CM | POA: Diagnosis not present

## 2017-01-26 DIAGNOSIS — W11XXXD Fall on and from ladder, subsequent encounter: Secondary | ICD-10-CM | POA: Diagnosis not present

## 2017-01-26 DIAGNOSIS — S72001D Fracture of unspecified part of neck of right femur, subsequent encounter for closed fracture with routine healing: Secondary | ICD-10-CM | POA: Diagnosis not present

## 2017-01-26 DIAGNOSIS — I509 Heart failure, unspecified: Secondary | ICD-10-CM | POA: Diagnosis not present

## 2017-01-26 DIAGNOSIS — N183 Chronic kidney disease, stage 3 (moderate): Secondary | ICD-10-CM | POA: Diagnosis not present

## 2017-01-26 DIAGNOSIS — E1151 Type 2 diabetes mellitus with diabetic peripheral angiopathy without gangrene: Secondary | ICD-10-CM | POA: Diagnosis not present

## 2017-01-26 MED FILL — CARVEDILOL 12.5 MG TABLET: 12.5 | 30 days supply | Qty: 60 | Fill #1 | Status: TO

## 2017-01-26 MED FILL — FUROSEMIDE 20 MG TABLET: 20 | 30 days supply | Qty: 30 | Fill #1 | Status: TO

## 2017-01-26 MED FILL — AMLODIPINE BESYLATE 10 MG T: 10 | 30 days supply | Qty: 30 | Fill #0

## 2017-01-26 MED FILL — ATORVASTATIN 20 MG TABLET: 20 | 30 days supply | Qty: 30 | Fill #1 | Status: TO

## 2017-01-26 NOTE — Telephone Encounter (Signed)
Called [facility] to advise on message. LMOM

## 2017-01-27 ENCOUNTER — Telehealth: Payer: Self-pay | Admitting: Family Medicine

## 2017-01-27 NOTE — Telephone Encounter (Signed)
Dr. Adrian Blackwater, please correct me if I am wrong - looks like he was supposed to be discharged on 3 weeks of Lovenox for DVT prophylaxis and that 3 weeks should have been completed yesterday but he never picked it up.

## 2017-01-27 NOTE — Telephone Encounter (Signed)
Pt called with requesting a refill of his blood thinners. Spoke to clinical pharmacist who advised that [facility] prescribed the med with 0 syringes, so no meds were given. Pt requesting urgent fill of script. States that he is coming to [facility] pharmacy tomorrow to pick up some other meds and would like to know if he may have this script then as well. Please f/u.

## 2017-01-28 MED ORDER — ASPIRIN EC 81 MG PO TBEC: 81.0000 mg | DELAYED_RELEASE_TABLET | Freq: Every day | ORAL | 5 refills | Status: DC

## 2017-01-28 MED ORDER — FUROSEMIDE 20 MG PO TABS: 20.0000 mg | ORAL_TABLET | Freq: Every day | ORAL | 3 refills | Status: DC

## 2017-01-28 NOTE — Telephone Encounter (Signed)
Mark [name], You are correct the DVT prophylaxis was only for 3 weeks post hospitalization. 5/9-5/30/18 Patient is advised to speak with ortho for recommendations regarding DVT prophylaxis  Patient was here today and notified

## 2017-01-28 NOTE — Telephone Encounter (Signed)
Called back to [facility] to [place]  regarding diabetes Patient it taking lantus 10 U and no novolog at home CBG 150  Plan Continue lantus 10 U D/c novolog sliding scale  Regarding HTN 140/80, 140/72  HR 72-73  Taking coreg and norvasc Having swelling in leg  Plan: restarts lasix 20 mg daily  Regarding anticoagulation He was anticoagulated in SNF and 4 days after Recommendation was for 3 weeks today Plan: No Lovenox Restart aspirin 81 mg daily

## 2017-01-31 DIAGNOSIS — E785 Hyperlipidemia, unspecified: Secondary | ICD-10-CM | POA: Diagnosis not present

## 2017-01-31 DIAGNOSIS — I130 Hypertensive heart and chronic kidney disease with heart failure and stage 1 through stage 4 chronic kidney disease, or unspecified chronic kidney disease: Secondary | ICD-10-CM | POA: Diagnosis not present

## 2017-01-31 DIAGNOSIS — N183 Chronic kidney disease, stage 3 (moderate): Secondary | ICD-10-CM | POA: Diagnosis not present

## 2017-01-31 DIAGNOSIS — S72001D Fracture of unspecified part of neck of right femur, subsequent encounter for closed fracture with routine healing: Secondary | ICD-10-CM | POA: Diagnosis not present

## 2017-01-31 DIAGNOSIS — Z794 Long term (current) use of insulin: Secondary | ICD-10-CM | POA: Diagnosis not present

## 2017-01-31 DIAGNOSIS — E1151 Type 2 diabetes mellitus with diabetic peripheral angiopathy without gangrene: Secondary | ICD-10-CM | POA: Diagnosis not present

## 2017-01-31 DIAGNOSIS — W11XXXD Fall on and from ladder, subsequent encounter: Secondary | ICD-10-CM | POA: Diagnosis not present

## 2017-01-31 DIAGNOSIS — E1122 Type 2 diabetes mellitus with diabetic chronic kidney disease: Secondary | ICD-10-CM | POA: Diagnosis not present

## 2017-01-31 DIAGNOSIS — I509 Heart failure, unspecified: Secondary | ICD-10-CM | POA: Diagnosis not present

## 2017-01-31 DIAGNOSIS — Z89512 Acquired absence of left leg below knee: Secondary | ICD-10-CM | POA: Diagnosis not present

## 2017-01-31 DIAGNOSIS — Z9181 History of falling: Secondary | ICD-10-CM | POA: Diagnosis not present

## 2017-02-02 ENCOUNTER — Ambulatory Visit (INDEPENDENT_AMBULATORY_CARE_PROVIDER_SITE_OTHER): Payer: Medicare Other | Admitting: Podiatry

## 2017-02-02 ENCOUNTER — Encounter: Payer: Self-pay | Admitting: Podiatry

## 2017-02-02 VITALS — BP 157/77 | HR 68 | Resp 18

## 2017-02-02 DIAGNOSIS — Z89512 Acquired absence of left leg below knee: Secondary | ICD-10-CM | POA: Diagnosis not present

## 2017-02-02 DIAGNOSIS — E1122 Type 2 diabetes mellitus with diabetic chronic kidney disease: Secondary | ICD-10-CM | POA: Diagnosis not present

## 2017-02-02 DIAGNOSIS — B351 Tinea unguium: Secondary | ICD-10-CM

## 2017-02-02 DIAGNOSIS — N183 Chronic kidney disease, stage 3 (moderate): Secondary | ICD-10-CM | POA: Diagnosis not present

## 2017-02-02 DIAGNOSIS — E1142 Type 2 diabetes mellitus with diabetic polyneuropathy: Secondary | ICD-10-CM | POA: Diagnosis not present

## 2017-02-02 DIAGNOSIS — Z794 Long term (current) use of insulin: Secondary | ICD-10-CM | POA: Diagnosis not present

## 2017-02-02 DIAGNOSIS — I130 Hypertensive heart and chronic kidney disease with heart failure and stage 1 through stage 4 chronic kidney disease, or unspecified chronic kidney disease: Secondary | ICD-10-CM | POA: Diagnosis not present

## 2017-02-02 DIAGNOSIS — E1151 Type 2 diabetes mellitus with diabetic peripheral angiopathy without gangrene: Secondary | ICD-10-CM | POA: Diagnosis not present

## 2017-02-02 DIAGNOSIS — W11XXXD Fall on and from ladder, subsequent encounter: Secondary | ICD-10-CM | POA: Diagnosis not present

## 2017-02-02 DIAGNOSIS — M79674 Pain in right toe(s): Secondary | ICD-10-CM

## 2017-02-02 DIAGNOSIS — E785 Hyperlipidemia, unspecified: Secondary | ICD-10-CM | POA: Diagnosis not present

## 2017-02-02 DIAGNOSIS — S72001D Fracture of unspecified part of neck of right femur, subsequent encounter for closed fracture with routine healing: Secondary | ICD-10-CM | POA: Diagnosis not present

## 2017-02-02 DIAGNOSIS — I509 Heart failure, unspecified: Secondary | ICD-10-CM | POA: Diagnosis not present

## 2017-02-02 DIAGNOSIS — Z9181 History of falling: Secondary | ICD-10-CM | POA: Diagnosis not present

## 2017-02-02 NOTE — Patient Instructions (Signed)

## 2017-02-02 NOTE — Progress Notes (Signed)
Patient ID: SAHEJ SCHRIEBER, male   DOB: 10/15/1950, 66 y.o.   MRN: [id number]    Subjective: This diabetic patient presents today for a scheduled visit for debridement of toenails on the right foot Is a diabetic with a history of BK amputation left and 2014  Objective:  Orientated 3  Vascular: DP and PT right 2/4 Capillary reflex right immediate  Neurological: Sensation to 10 g monofilament wire intact 3/5 right Vibratory sensation nonreactive right Ankle reflexe reactive right  Dermatological: No open skin lesions on the right Toenails 1-5 are extremely hypertrophic, elongated, deformed and tender to direct palpation Atrophic skin with absent hair growth right  Musculoskeletal: BK amputation left HAV right [place]ertoe 2-4 right  Assessment: Adequate vascular status right 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

## 2017-02-07 DIAGNOSIS — I130 Hypertensive heart and chronic kidney disease with heart failure and stage 1 through stage 4 chronic kidney disease, or unspecified chronic kidney disease: Secondary | ICD-10-CM | POA: Diagnosis not present

## 2017-02-07 DIAGNOSIS — I509 Heart failure, unspecified: Secondary | ICD-10-CM | POA: Diagnosis not present

## 2017-02-07 DIAGNOSIS — Z9181 History of falling: Secondary | ICD-10-CM | POA: Diagnosis not present

## 2017-02-07 DIAGNOSIS — E1122 Type 2 diabetes mellitus with diabetic chronic kidney disease: Secondary | ICD-10-CM | POA: Diagnosis not present

## 2017-02-07 DIAGNOSIS — Z89512 Acquired absence of left leg below knee: Secondary | ICD-10-CM | POA: Diagnosis not present

## 2017-02-07 DIAGNOSIS — Z794 Long term (current) use of insulin: Secondary | ICD-10-CM | POA: Diagnosis not present

## 2017-02-07 DIAGNOSIS — E1151 Type 2 diabetes mellitus with diabetic peripheral angiopathy without gangrene: Secondary | ICD-10-CM | POA: Diagnosis not present

## 2017-02-07 DIAGNOSIS — N183 Chronic kidney disease, stage 3 (moderate): Secondary | ICD-10-CM | POA: Diagnosis not present

## 2017-02-07 DIAGNOSIS — S72001D Fracture of unspecified part of neck of right femur, subsequent encounter for closed fracture with routine healing: Secondary | ICD-10-CM | POA: Diagnosis not present

## 2017-02-07 DIAGNOSIS — W11XXXD Fall on and from ladder, subsequent encounter: Secondary | ICD-10-CM | POA: Diagnosis not present

## 2017-02-07 DIAGNOSIS — E785 Hyperlipidemia, unspecified: Secondary | ICD-10-CM | POA: Diagnosis not present

## 2017-02-08 ENCOUNTER — Encounter: Payer: Self-pay | Admitting: Family Medicine

## 2017-02-08 ENCOUNTER — Ambulatory Visit: Payer: Medicare Other | Attending: Family Medicine | Admitting: Family Medicine

## 2017-02-08 VITALS — BP 137/69 | HR 79 | Temp 98.5°F | Wt 212.6 lb

## 2017-02-08 DIAGNOSIS — Z7982 Long term (current) use of aspirin: Secondary | ICD-10-CM | POA: Diagnosis not present

## 2017-02-08 DIAGNOSIS — Z23 Encounter for immunization: Secondary | ICD-10-CM

## 2017-02-08 DIAGNOSIS — N183 Chronic kidney disease, stage 3 unspecified: Secondary | ICD-10-CM

## 2017-02-08 DIAGNOSIS — E1122 Type 2 diabetes mellitus with diabetic chronic kidney disease: Secondary | ICD-10-CM | POA: Diagnosis not present

## 2017-02-08 DIAGNOSIS — Z87891 Personal history of nicotine dependence: Secondary | ICD-10-CM | POA: Insufficient documentation

## 2017-02-08 DIAGNOSIS — Z794 Long term (current) use of insulin: Secondary | ICD-10-CM | POA: Diagnosis not present

## 2017-02-08 DIAGNOSIS — H538 Other visual disturbances: Secondary | ICD-10-CM | POA: Diagnosis not present

## 2017-02-08 DIAGNOSIS — E1100 Type 2 diabetes mellitus with hyperosmolarity without nonketotic hyperglycemic-hyperosmolar coma (NKHHC): Secondary | ICD-10-CM | POA: Diagnosis not present

## 2017-02-08 DIAGNOSIS — Z79899 Other long term (current) drug therapy: Secondary | ICD-10-CM | POA: Diagnosis not present

## 2017-02-08 DIAGNOSIS — N179 Acute kidney failure, unspecified: Secondary | ICD-10-CM

## 2017-02-08 DIAGNOSIS — I10 Essential (primary) hypertension: Secondary | ICD-10-CM | POA: Diagnosis not present

## 2017-02-08 DIAGNOSIS — I130 Hypertensive heart and chronic kidney disease with heart failure and stage 1 through stage 4 chronic kidney disease, or unspecified chronic kidney disease: Secondary | ICD-10-CM | POA: Diagnosis not present

## 2017-02-08 DIAGNOSIS — I5042 Chronic combined systolic (congestive) and diastolic (congestive) heart failure: Secondary | ICD-10-CM | POA: Insufficient documentation

## 2017-02-08 DIAGNOSIS — D62 Acute posthemorrhagic anemia: Secondary | ICD-10-CM | POA: Diagnosis not present

## 2017-02-08 LAB — POCT GLYCOSYLATED HEMOGLOBIN (HGB A1C): Hemoglobin A1C: 7.3

## 2017-02-08 LAB — GLUCOSE, POCT (MANUAL RESULT ENTRY): POC Glucose: 129 mg/dl — AB (ref 70–99)

## 2017-02-08 MED ORDER — INSULIN GLARGINE 100 UNIT/ML SOLOSTAR PEN: 10.0000 [IU] | PEN_INJECTOR | SUBCUTANEOUS | 3 refills | Status: DC

## 2017-02-08 MED ORDER — GLUCOSE BLOOD VI STRP: 1.0000 | ORAL_STRIP | Freq: Three times a day (TID) | 12 refills | Status: DC

## 2017-02-08 NOTE — Progress Notes (Signed)
Subjective:  Patient ID: Mark [name], male    DOB: 03/16/1951  Age: 66 y.o. MRN: [id number]  CC: Hip Pain; Hypertension; and Diabetes   HPI Mark [name] has CKD stage 3, HTN, diabetes, combined diastolic and systolic CHF he  presents for   1. CHRONIC DIABETES  Disease Monitoring  Blood Sugar Ranges: not checking   Polyuria: yes, on lasix    Visual problems: yes, reports blurred vision under fluorescent lights    Medication Compliance: yes. lantus 10 U most nights Medication Side Effects  Hypoglycemia: yes, subjective lows in early mornings   Mark [name] Exam: due   Foot Exam: done today, s/p BKA on the L   Diet pattern: eating low sugar   Exercise: minimal   2. HTN: taking lasix 20 mg daily, Norvasc 20 mg daily and coreg 12.5 mg BID. No HA or CP. Has swelling in R leg that is improving. Wearing a compression stocking.   3. R hip fracture:  following fall from ladder while trimming a tree limb at her daughter's house on 12/31/2016.  He reports pain is 8/10. He had PT earlier today.  He is working with home health PT.   Social History  Substance Use Topics  . Smoking status: Former Smoker    Quit date: 08/03/2013  . Smokeless tobacco: Never Used  . Alcohol use No   Outpatient Medications Prior to Visit  Medication Sig Dispense Refill  . ACCU-CHEK AVIVA PLUS test strip USE AS INSTRUCTED 100 each 12  . acetaminophen (TYLENOL) 325 MG tablet Take 2 tablets (650 mg total) by mouth every 6 (six) hours as needed for mild pain (or Fever >/= 101).    Marland Kitchen amLODipine (NORVASC) 10 MG tablet Take 1 tablet (10 mg total) by mouth daily. 90 tablet 3  . aspirin EC 81 MG tablet Take 1 tablet (81 mg total) by mouth daily. 30 tablet 5  . atorvastatin (LIPITOR) 20 MG tablet Take 1 tablet (20 mg total) by mouth daily. 90 tablet 3  . B-D UF III MINI PEN NEEDLES 31G X 5 MM MISC USE TO INJECT INSULIN AS DIRECTED BY PHYSICIAN 100 each 12  . Blood Glucose Monitoring Suppl  (ACCU-CHEK AVIVA PLUS) W/DEVICE KIT Use as prescribed TID before meals and QHS 1 kit 0  . carvedilol (COREG) 12.5 MG tablet Take 1 tablet (12.5 mg total) by mouth 2 (two) times daily with a meal. 180 tablet 3  . doxycycline (VIBRA-TABS) 100 MG tablet Take 1 tablet (100 mg total) by mouth 2 (two) times daily. X 6 days    . enoxaparin (LOVENOX) 40 MG/0.4ML injection Inject 0.4 mLs (40 mg total) into the skin daily. X 3 weeks for DVT prophylaxis 0 Syringe   . ferrous gluconate (FERGON) 324 MG tablet Take 1 tablet (324 mg total) by mouth 2 (two) times daily with a meal. 60 tablet 5  . furosemide (LASIX) 20 MG tablet Take 1 tablet (20 mg total) by mouth daily. 30 tablet 3  . HYDROcodone-acetaminophen (NORCO/VICODIN) 5-325 MG tablet Take 1-2 tablets by mouth every 6 (six) hours as needed for moderate pain. 15 tablet 0  . Insulin Glargine (LANTUS SOLOSTAR) 100 UNIT/ML Solostar Pen Inject 10 Units into the skin daily at 10 pm. 10 pen 3  . Insulin Syringe-Needle U-100 (INSULIN SYRINGE .5CC/30GX5/16") 30G X 5/16" 0.5 ML MISC Check blood sugar TID & QHS 100 each 2  . methocarbamol (ROBAXIN) 500 MG tablet Take 1 tablet (500 mg  total) by mouth every 8 (eight) hours as needed for muscle spasms. 30 tablet 0  . polyethylene glycol (MIRALAX / GLYCOLAX) packet Take 17 g by mouth daily as needed for moderate constipation. 14 each 0  . senna-docusate (SENOKOT-S) 8.6-50 MG tablet Take 1 tablet by mouth 2 (two) times daily.    Marland Kitchen VIMPAT 200 MG TABS tablet TAKE ONE TABLET BY MOUTH TWICE DAILY 60 tablet 5   No facility-administered medications prior to visit.     ROS Review of Systems  Constitutional: Negative for chills, fatigue, fever and unexpected weight change.  Eyes: Positive for visual disturbance.  Respiratory: Negative for cough and shortness of breath.   Cardiovascular: Negative for chest pain, palpitations and leg swelling.  Gastrointestinal: Negative for abdominal pain, blood in stool, constipation,  diarrhea, nausea and vomiting.  Endocrine: Negative for polydipsia, polyphagia and polyuria.  Musculoskeletal: Negative for arthralgias, back pain, gait problem, myalgias and neck pain.  Skin: Negative for rash.  Allergic/Immunologic: Negative for immunocompromised state.  Hematological: Negative for adenopathy. Does not bruise/bleed easily.  Psychiatric/Behavioral: Negative for dysphoric mood, sleep disturbance and suicidal ideas. The patient is not nervous/anxious.     Objective:  BP 137/69   Pulse 79   Temp 98.5 F (36.9 C) (Oral)   Wt 212 lb 9.6 oz (96.4 kg)   SpO2 99%   BMI 27.30 kg/m   BP/Weight 02/08/2017 12/01/3152 0/0/8676  Systolic BP 195 093 267  Diastolic BP 69 77 61  Wt. (Lbs) 212.6 - -  BMI 27.3 - -  Some encounter information is confidential and restricted. Go to Review Flowsheets activity to see all data.   Wt Readings from Last 3 Encounters:  02/08/17 212 lb 9.6 oz (96.4 kg)  12/31/16 206 lb (93.4 kg)  11/22/16 217 lb (98.4 kg)   Physical Exam  Constitutional: He appears well-developed and well-nourished. No distress.  HENT:  Head: Normocephalic and atraumatic.  Neck: Normal range of motion. Neck supple.  Cardiovascular: Normal rate, regular rhythm, normal heart sounds and intact distal pulses.   Pulmonary/Chest: Effort normal and breath sounds normal.  Musculoskeletal: He exhibits no edema.  Neurological: He is alert.  Skin: Skin is warm and dry. No rash noted. No erythema.  Psychiatric: He has a normal mood and affect.    Lab Results  Component Value Date   HGBA1C 7.3 11/02/2016   CBG 129  Lab Results  Component Value Date   WBC 13.1 (H) 01/05/2017   HGB 8.0 (L) 01/05/2017   HCT 24.7 (L) 01/05/2017   MCV 89.3 01/05/2017   PLT 280 01/05/2017     Chemistry      Component Value Date/Time   NA 141 01/05/2017 0539   NA 141 01/14/2016   K 4.4 01/05/2017 0539   CL 112 (H) 01/05/2017 0539   CO2 21 (L) 01/05/2017 0539   BUN 46 (H) 01/05/2017  0539   BUN 23 (A) 01/14/2016   CREATININE 2.42 (H) 01/05/2017 0539   CREATININE 2.16 (H) 11/16/2016 1148   GLU 175 01/14/2016      Component Value Date/Time   CALCIUM 8.1 (L) 01/05/2017 0539   ALKPHOS 84 12/31/2016 1822   AST 19 12/31/2016 1822   ALT 19 12/31/2016 1822   BILITOT 0.4 12/31/2016 1822      Assessment & Plan:   Sharrieff was seen today for hip pain, hypertension and diabetes.  Diagnoses and all orders for this visit:  Type 2 diabetes mellitus with hyperosmolarity without coma, without long-term current  use of insulin (HCC) -     POCT glucose (manual entry) -     POCT glycosylated hemoglobin (Hb A1C) -     glucose blood (ACCU-CHEK AVIVA PLUS) test strip; 1 each by Other route 3 (three) times daily. -     Insulin Glargine (LANTUS SOLOSTAR) 100 UNIT/ML Solostar Pen; Inject 10 Units into the skin every morning.  CKD (chronic kidney disease) stage 3, GFR 30-59 ml/min -     CBC -     BMP8+EGFR  Essential hypertension  Acute blood loss as cause of postoperative anemia  Acute renal failure superimposed on stage 3 chronic kidney disease, unspecified acute renal failure type ([place])  Other orders -     Cancel: Tdap vaccine greater than or equal to 65yo IM -     Pneumococcal conjugate vaccine 13-valent IM    No orders of the defined types were placed in this encounter.   Follow-up: No Follow-up on file.   Boykin Nearing MD

## 2017-02-08 NOTE — Patient Instructions (Addendum)
Talis was seen today for hip pain, hypertension and diabetes.  Diagnoses and all orders for this visit:  Type 2 diabetes mellitus with hyperosmolarity without coma, without long-term current use of insulin (HCC) -     POCT glucose (manual entry) -     POCT glycosylated hemoglobin (Hb A1C) -     glucose blood (ACCU-CHEK AVIVA PLUS) test strip; 1 each by Other route 3 (three) times daily. -     Insulin Glargine (LANTUS SOLOSTAR) 100 UNIT/ML Solostar Pen; Inject 10 Units into the skin every morning.  CKD (chronic kidney disease) stage 3, GFR 30-59 ml/min -     CBC -     BMP8+EGFR  Essential hypertension   Continue current regimen Your lasix dose is 20 mg daily  F/u in 3 months for HTN and diabetes   Dr. Adrian Blackwater

## 2017-02-09 LAB — BMP8+EGFR
BUN/Creatinine Ratio: 19 (ref 10–24)
BUN: 54 mg/dL — ABNORMAL HIGH (ref 8–27)
CO2: 21 mmol/L (ref 20–29)
Calcium: 9.6 mg/dL (ref 8.6–10.2)
Chloride: 115 mmol/L — ABNORMAL HIGH (ref 96–106)
Creatinine, Ser: 2.9 mg/dL — ABNORMAL HIGH (ref 0.76–1.27)
GFR calc Af Amer: 25 mL/min/{1.73_m2} — ABNORMAL LOW (ref >59)
GFR calc non Af Amer: 22 mL/min/{1.73_m2} — ABNORMAL LOW (ref >59)
Glucose: 127 mg/dL — ABNORMAL HIGH (ref 65–99)
Potassium: 5.5 mmol/L — ABNORMAL HIGH (ref 3.5–5.2)
Sodium: 149 mmol/L — ABNORMAL HIGH (ref 134–144)

## 2017-02-09 LAB — CBC
Hematocrit: 30.9 % — ABNORMAL LOW (ref 37.5–51.0)
Hemoglobin: 9.6 g/dL — ABNORMAL LOW (ref 13.0–17.7)
MCH: 28.4 pg (ref 26.6–33.0)
MCHC: 31.1 g/dL — ABNORMAL LOW (ref 31.5–35.7)
MCV: 91 fL (ref 79–97)
Platelets: 357 10*3/uL (ref 150–379)
RBC: 3.38 x10E6/uL — ABNORMAL LOW (ref 4.14–5.80)
RDW: 15.3 % (ref 12.3–15.4)
WBC: 9.2 10*3/uL (ref 3.4–10.8)

## 2017-02-09 MED FILL — ACCU-CHEK AVIVA PLUS TEST S: 30 days supply | Qty: 100 | Fill #0

## 2017-02-09 MED FILL — LANTUS SOLOSTAR 100 UNITS/M: 100 | 30 days supply | Qty: 3 | Fill #0

## 2017-02-09 NOTE — Assessment & Plan Note (Signed)
Well controlled Transition lantus dosing to AM to avoid hypoglycemia unawareness

## 2017-02-09 NOTE — Assessment & Plan Note (Signed)
well controlled on current regimen Continue current regimen and check metabolic panel

## 2017-02-09 NOTE — Assessment & Plan Note (Signed)
Improved

## 2017-02-09 NOTE — Assessment & Plan Note (Signed)
acute on chronic CKD with hyperkalemia Plan: Increase lasix to 40 mg daily  Return in one week for repeat labs

## 2017-02-10 ENCOUNTER — Ambulatory Visit (INDEPENDENT_AMBULATORY_CARE_PROVIDER_SITE_OTHER): Payer: Medicare Other

## 2017-02-10 ENCOUNTER — Ambulatory Visit (INDEPENDENT_AMBULATORY_CARE_PROVIDER_SITE_OTHER): Payer: Medicare Other | Admitting: Orthopedic Surgery

## 2017-02-10 ENCOUNTER — Encounter (INDEPENDENT_AMBULATORY_CARE_PROVIDER_SITE_OTHER): Payer: Self-pay | Admitting: Orthopedic Surgery

## 2017-02-10 DIAGNOSIS — S72141G Displaced intertrochanteric fracture of right femur, subsequent encounter for closed fracture with delayed healing: Secondary | ICD-10-CM | POA: Diagnosis not present
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
## 2017-02-17 ENCOUNTER — Ambulatory Visit: Payer: Medicare Other | Admitting: Family Medicine

## 2017-02-17 ENCOUNTER — Telehealth: Payer: Self-pay

## 2017-02-17 DIAGNOSIS — I130 Hypertensive heart and chronic kidney disease with heart failure and stage 1 through stage 4 chronic kidney disease, or unspecified chronic kidney disease: Secondary | ICD-10-CM | POA: Diagnosis not present

## 2017-02-17 DIAGNOSIS — S72001D Fracture of unspecified part of neck of right femur, subsequent encounter for closed fracture with routine healing: Secondary | ICD-10-CM | POA: Diagnosis not present

## 2017-02-17 DIAGNOSIS — E785 Hyperlipidemia, unspecified: Secondary | ICD-10-CM | POA: Diagnosis not present

## 2017-02-17 DIAGNOSIS — E1122 Type 2 diabetes mellitus with diabetic chronic kidney disease: Secondary | ICD-10-CM | POA: Diagnosis not present

## 2017-02-17 DIAGNOSIS — W11XXXD Fall on and from ladder, subsequent encounter: Secondary | ICD-10-CM | POA: Diagnosis not present

## 2017-02-17 DIAGNOSIS — Z89512 Acquired absence of left leg below knee: Secondary | ICD-10-CM | POA: Diagnosis not present

## 2017-02-17 DIAGNOSIS — Z794 Long term (current) use of insulin: Secondary | ICD-10-CM | POA: Diagnosis not present

## 2017-02-17 DIAGNOSIS — I509 Heart failure, unspecified: Secondary | ICD-10-CM | POA: Diagnosis not present

## 2017-02-17 DIAGNOSIS — Z9181 History of falling: Secondary | ICD-10-CM | POA: Diagnosis not present

## 2017-02-17 DIAGNOSIS — N183 Chronic kidney disease, stage 3 (moderate): Secondary | ICD-10-CM | POA: Diagnosis not present

## 2017-02-17 DIAGNOSIS — E1151 Type 2 diabetes mellitus with diabetic peripheral angiopathy without gangrene: Secondary | ICD-10-CM | POA: Diagnosis not present

## 2017-02-17 NOTE — Telephone Encounter (Signed)
Pt was called and a VM was left informing pt to return phone call for lab results.

## 2017-03-31 ENCOUNTER — Other Ambulatory Visit: Payer: Self-pay | Admitting: Internal Medicine

## 2017-03-31 MED FILL — BD PEN NDL MINI 31GX5MM: 31G X 5 MM | 25 days supply | Qty: 100 | Fill #0

## 2017-04-11 ENCOUNTER — Encounter: Payer: Self-pay | Admitting: Neurology

## 2017-04-11 ENCOUNTER — Ambulatory Visit (INDEPENDENT_AMBULATORY_CARE_PROVIDER_SITE_OTHER): Payer: Medicare Other | Admitting: Neurology

## 2017-04-11 VITALS — BP 131/68 | HR 68 | Ht 74.0 in | Wt 213.5 lb

## 2017-04-11 DIAGNOSIS — G40909 Epilepsy, unspecified, not intractable, without status epilepticus: Secondary | ICD-10-CM
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
## 2017-04-11 NOTE — Patient Instructions (Signed)
    We will check an EEG study of the brain. If this is normal, we may consider coming off of the Vimpat.

## 2017-04-13 ENCOUNTER — Encounter: Payer: Medicare Other | Attending: Physical Medicine & Rehabilitation | Admitting: Physical Medicine & Rehabilitation

## 2017-04-19 ENCOUNTER — Other Ambulatory Visit: Payer: Medicare Other

## 2017-04-20 ENCOUNTER — Encounter: Payer: Self-pay | Admitting: Neurology

## 2017-04-20 ENCOUNTER — Ambulatory Visit (INDEPENDENT_AMBULATORY_CARE_PROVIDER_SITE_OTHER): Payer: Medicare Other

## 2017-04-20 ENCOUNTER — Encounter (INDEPENDENT_AMBULATORY_CARE_PROVIDER_SITE_OTHER): Payer: Self-pay | Admitting: Orthopedic Surgery

## 2017-04-20 ENCOUNTER — Ambulatory Visit (INDEPENDENT_AMBULATORY_CARE_PROVIDER_SITE_OTHER): Payer: Medicare Other | Admitting: Orthopedic Surgery

## 2017-04-20 DIAGNOSIS — S72001A Fracture of unspecified part of neck of right femur, initial encounter for closed fracture: Secondary | ICD-10-CM

## 2017-04-24 ENCOUNTER — Other Ambulatory Visit: Payer: Self-pay | Admitting: Family Medicine

## 2017-04-24 DIAGNOSIS — I10 Essential (primary) hypertension: Secondary | ICD-10-CM
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
## 2017-04-26 DIAGNOSIS — E113393 Type 2 diabetes mellitus with moderate nonproliferative diabetic retinopathy without macular edema, bilateral: Secondary | ICD-10-CM | POA: Diagnosis not present

## 2017-04-26 DIAGNOSIS — H25813 Combined forms of age-related cataract, bilateral: Secondary | ICD-10-CM | POA: Diagnosis not present

## 2017-05-03 ENCOUNTER — Telehealth: Payer: Self-pay | Admitting: Neurology

## 2017-05-03 NOTE — Telephone Encounter (Signed)
Pt called to r/s appt from 04/19/17. When trying to r/s this appt, the pop up shows it is blocked and it cannot be r/s. It states the order has been c/a. Does this pt need and EEG?

## 2017-05-04 ENCOUNTER — Ambulatory Visit: Payer: Medicare Other | Admitting: Podiatry

## 2017-05-04 ENCOUNTER — Other Ambulatory Visit: Payer: Self-pay | Admitting: Family Medicine

## 2017-05-04 DIAGNOSIS — I5042 Chronic combined systolic (congestive) and diastolic (congestive) heart failure: Secondary | ICD-10-CM

## 2017-05-04 DIAGNOSIS — I10 Essential (primary) hypertension: Secondary | ICD-10-CM

## 2017-05-04 MED FILL — CARVEDILOL 12.5 MG TABLET: 12.5 | 30 days supply | Qty: 60 | Fill #0

## 2017-05-04 MED FILL — AMLODIPINE BESYLATE 10 MG T: 10 | 30 days supply | Qty: 30 | Fill #1

## 2017-05-05 NOTE — Telephone Encounter (Signed)
I have tried to get in touch with the pt but home number continues to ring and mobile number is not setup.  FYI

## 2017-05-09 ENCOUNTER — Encounter: Payer: Self-pay | Admitting: Podiatry

## 2017-05-09 ENCOUNTER — Ambulatory Visit (INDEPENDENT_AMBULATORY_CARE_PROVIDER_SITE_OTHER): Payer: Medicare Other | Admitting: Podiatry

## 2017-05-09 VITALS — BP 163/84 | HR 62

## 2017-05-09 DIAGNOSIS — L84 Corns and callosities: Secondary | ICD-10-CM | POA: Diagnosis not present

## 2017-05-09 DIAGNOSIS — B351 Tinea unguium: Secondary | ICD-10-CM

## 2017-05-09 DIAGNOSIS — E1142 Type 2 diabetes mellitus with diabetic polyneuropathy: Secondary | ICD-10-CM | POA: Diagnosis not present

## 2017-05-09 DIAGNOSIS — M79674 Pain in right toe(s): Secondary | ICD-10-CM | POA: Diagnosis not present

## 2017-05-09 MED FILL — ACCU-CHEK AVIVA PLUS TEST S: 30 days supply | Qty: 100 | Fill #1

## 2017-05-09 NOTE — Patient Instructions (Signed)
Try wearing a running style shoe with a soft upper to reduce shoe correction on the fourth right toe, causing a corn  Diabetes and Foot Care Diabetes may cause you to have problems because of poor blood supply (circulation) to your feet and legs. This may cause the skin on your feet to become thinner, break easier, and heal more slowly. Your skin may become dry, and the skin may peel and crack. You may also have nerve damage in your legs and feet causing decreased feeling in them. You may not notice minor injuries to your feet that could lead to infections or more serious problems. Taking care of your feet is one of the most important things you can do for yourself. Follow these instructions at home:  Wear shoes at all times, even in the house. Do not go barefoot. Bare feet are easily injured.  Check your feet daily for blisters, cuts, and redness. If you cannot see the bottom of your feet, use a mirror or ask someone for help.  Wash your feet with warm water (do not use hot water) and mild soap. Then pat your feet and the areas between your toes until they are completely dry. Do not soak your feet as this can dry your skin.  Apply a moisturizing lotion or petroleum jelly (that does not contain alcohol and is unscented) to the skin on your feet and to dry, brittle toenails. Do not apply lotion between your toes.  Trim your toenails straight across. Do not dig under them or around the cuticle. File the edges of your nails with an emery board or nail file.  Do not cut corns or calluses or try to remove them with medicine.  Wear clean socks or stockings every day. Make sure they are not too tight. Do not wear knee-high stockings since they may decrease blood flow to your legs.  Wear shoes that fit properly and have enough cushioning. To break in new shoes, wear them for just a few hours a day. This prevents you from injuring your feet. Always look in your shoes before you put them on to be sure  there are no objects inside.  Do not cross your legs. This may decrease the blood flow to your feet.  If you find a minor scrape, cut, or break in the skin on your feet, keep it and the skin around it clean and dry. These areas may be cleansed with mild soap and water. Do not cleanse the area with peroxide, alcohol, or iodine.  When you remove an adhesive bandage, be sure not to damage the skin around it.  If you have a wound, look at it several times a day to make sure it is healing.  Do not use heating pads or hot water bottles. They may burn your skin. If you have lost feeling in your feet or legs, you may not know it is happening until it is too late.  Make sure your health care provider performs a complete foot exam at least annually or more often if you have foot problems. Report any cuts, sores, or bruises to your health care provider immediately. Contact a health care provider if:  You have an injury that is not healing.  You have cuts or breaks in the skin.  You have an ingrown nail.  You notice redness on your legs or feet.  You feel burning or tingling in your legs or feet.  You have pain or cramps in your legs and  feet.  Your legs or feet are numb.  Your feet always feel cold. Get help right away if:  There is increasing redness, swelling, or pain in or around a wound.  There is a red line that goes up your leg.  Pus is coming from a wound.  You develop a fever or as directed by your health care provider.  You notice a bad smell coming from an ulcer or wound. This information is not intended to replace advice given to you by your health care provider. Make sure you discuss any questions you have with your health care provider. Document Released: 08/13/2000 Document Revised: 01/22/2016 Document Reviewed: 01/23/2013 Elsevier Interactive Patient Education  2017 [employer].

## 2017-05-09 NOTE — Progress Notes (Signed)
Patient ID: TEDDY REBSTOCK, male   DOB: 01/11/1951, 66 y.o.   MRN: [id number]     Subjective: This diabetic patient presents today for a scheduled visit for debridement of toenails on the right foot Is a diabetic with a history of BK amputation left and 2014  Objective:  Orientated 3  Vascular: DP and PT right 2/4 Capillary reflex right immediate  Neurological: Sensation to 10 g monofilament wire intact 3/5 right Vibratory sensation nonreactive right Ankle reflexe reactive right  Dermatological: No open skin lesions on the right Toenails 1-5 are extremely hypertrophic, elongated, deformed and tender to direct palpation Atrophic skin with absent hair growth right Corn fourth right toe  Musculoskeletal: BK amputation left HAV right Hammertoe 2-4 right  Assessment: Adequate vascular status right Diabetic peripheral neuropathy 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

## 2017-05-11 MED FILL — LANTUS SOLOSTAR 100 UNITS/M: 100 | 30 days supply | Qty: 3 | Fill #1

## 2017-05-12 ENCOUNTER — Ambulatory Visit: Payer: Medicare Other | Attending: Internal Medicine | Admitting: Internal Medicine

## 2017-05-12 ENCOUNTER — Encounter: Payer: Self-pay | Admitting: Internal Medicine

## 2017-05-12 VITALS — BP 146/73 | HR 73 | Temp 98.5°F | Resp 16 | Wt 224.8 lb

## 2017-05-12 DIAGNOSIS — K219 Gastro-esophageal reflux disease without esophagitis: Secondary | ICD-10-CM | POA: Insufficient documentation

## 2017-05-12 DIAGNOSIS — I10 Essential (primary) hypertension: Secondary | ICD-10-CM

## 2017-05-12 DIAGNOSIS — Z794 Long term (current) use of insulin: Secondary | ICD-10-CM | POA: Diagnosis not present

## 2017-05-12 DIAGNOSIS — I130 Hypertensive heart and chronic kidney disease with heart failure and stage 1 through stage 4 chronic kidney disease, or unspecified chronic kidney disease: Secondary | ICD-10-CM | POA: Insufficient documentation

## 2017-05-12 DIAGNOSIS — R269 Unspecified abnormalities of gait and mobility: Secondary | ICD-10-CM | POA: Insufficient documentation

## 2017-05-12 DIAGNOSIS — Z23 Encounter for immunization: Secondary | ICD-10-CM | POA: Insufficient documentation

## 2017-05-12 DIAGNOSIS — Z79899 Other long term (current) drug therapy: Secondary | ICD-10-CM | POA: Diagnosis not present

## 2017-05-12 DIAGNOSIS — N183 Chronic kidney disease, stage 3 unspecified: Secondary | ICD-10-CM

## 2017-05-12 DIAGNOSIS — Z89512 Acquired absence of left leg below knee: Secondary | ICD-10-CM | POA: Insufficient documentation

## 2017-05-12 DIAGNOSIS — E1100 Type 2 diabetes mellitus with hyperosmolarity without nonketotic hyperglycemic-hyperosmolar coma (NKHHC): Secondary | ICD-10-CM

## 2017-05-12 DIAGNOSIS — D509 Iron deficiency anemia, unspecified: Secondary | ICD-10-CM | POA: Insufficient documentation

## 2017-05-12 DIAGNOSIS — Z87891 Personal history of nicotine dependence: Secondary | ICD-10-CM | POA: Insufficient documentation

## 2017-05-12 DIAGNOSIS — E1122 Type 2 diabetes mellitus with diabetic chronic kidney disease: Secondary | ICD-10-CM | POA: Insufficient documentation

## 2017-05-12 DIAGNOSIS — I5042 Chronic combined systolic (congestive) and diastolic (congestive) heart failure: Secondary | ICD-10-CM | POA: Diagnosis not present

## 2017-05-12 DIAGNOSIS — Z7982 Long term (current) use of aspirin: Secondary | ICD-10-CM | POA: Diagnosis not present

## 2017-05-12 DIAGNOSIS — G40909 Epilepsy, unspecified, not intractable, without status epilepticus: Secondary | ICD-10-CM | POA: Insufficient documentation

## 2017-05-12 DIAGNOSIS — E1159 Type 2 diabetes mellitus with other circulatory complications: Secondary | ICD-10-CM | POA: Insufficient documentation

## 2017-05-12 LAB — POCT GLYCOSYLATED HEMOGLOBIN (HGB A1C): Hemoglobin A1C: 7.4

## 2017-05-12 LAB — GLUCOSE, POCT (MANUAL RESULT ENTRY): POC Glucose: 144 mg/dl — AB (ref 70–99)
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
## 2017-05-12 NOTE — Patient Instructions (Addendum)
Please sign release to get colonoscopy report from [facility].  I have referred you to physical therapy for motorized scooter evaluation.   Influenza Virus Vaccine injection (Fluarix) What is this medicine? INFLUENZA VIRUS VACCINE (in floo EN zuh VAHY ruhs vak SEEN) helps to reduce the risk of getting influenza also known as the flu. This medicine may be used for other purposes; ask your health care provider or pharmacist if you have questions. COMMON BRAND NAME(S): Fluarix, Fluzone What should I tell my health care provider before I take this medicine? They need to know if you have any of these conditions: -bleeding disorder like hemophilia -fever or infection -Guillain-Barre syndrome or other neurological problems -immune system problems -infection with the human immunodeficiency virus (HIV) or AIDS -low blood platelet counts -multiple sclerosis -an unusual or allergic reaction to influenza virus vaccine, eggs, chicken proteins, latex, gentamicin, other medicines, foods, dyes or preservatives -pregnant or trying to get pregnant -breast-feeding How should I use this medicine? This vaccine is for injection into a muscle. It is given by a health care professional. A copy of Vaccine Information Statements will be given before each vaccination. Read this sheet carefully each time. The sheet may change frequently. Talk to your pediatrician regarding the use of this medicine in children. Special care may be needed. Overdosage: If you think you have taken too much of this medicine contact a poison control center or emergency room at once. NOTE: This medicine is only for you. Do not share this medicine with others. What if I miss a dose? This does not apply. What may interact with this medicine? -chemotherapy or radiation therapy -medicines that lower your immune system like etanercept, anakinra, infliximab, and adalimumab -medicines that treat or prevent blood clots like  warfarin -phenytoin -steroid medicines like prednisone or cortisone -theophylline -vaccines This list may not describe all possible interactions. Give your health care provider a list of all the medicines, herbs, non-prescription drugs, or dietary supplements you use. Also tell them if you smoke, drink alcohol, or use illegal drugs. Some items may interact with your medicine. What should I watch for while using this medicine? Report any side effects that do not go away within 3 days to your doctor or health care professional. Call your health care provider if any unusual symptoms occur within 6 weeks of receiving this vaccine. You may still catch the flu, but the illness is not usually as bad. You cannot get the flu from the vaccine. The vaccine will not protect against colds or other illnesses that may cause fever. The vaccine is needed every year. What side effects may I notice from receiving this medicine? Side effects that you should report to your doctor or health care professional as soon as possible: -allergic reactions like skin rash, itching or hives, swelling of the face, lips, or tongue Side effects that usually do not require medical attention (report to your doctor or health care professional if they continue or are bothersome): -fever -headache -muscle aches and pains -pain, tenderness, redness, or swelling at site where injected -weak or tired This list may not describe all possible side effects. Call your doctor for medical advice about side effects. You may report side effects to FDA at 1-800-FDA-1088. Where should I keep my medicine? This vaccine is only given in a clinic, pharmacy, doctor's office, or other health care setting and will not be stored at home. NOTE: This sheet is a summary. It may not cover all possible information. If you have  questions about this medicine, talk to your doctor, pharmacist, or health care provider.  2018 Elsevier/Gold Standard (2008-03-13  09:30:40)

## 2017-05-13 ENCOUNTER — Ambulatory Visit: Payer: Medicare Other | Admitting: Internal Medicine

## 2017-05-13 ENCOUNTER — Telehealth: Payer: Self-pay | Admitting: Internal Medicine

## 2017-05-13 DIAGNOSIS — N184 Chronic kidney disease, stage 4 (severe): Secondary | ICD-10-CM

## 2017-05-13 LAB — BASIC METABOLIC PANEL
BUN/Creatinine Ratio: 18 (ref 10–24)
BUN: 54 mg/dL — ABNORMAL HIGH (ref 8–27)
CO2: 15 mmol/L — ABNORMAL LOW (ref 20–29)
Calcium: 9.4 mg/dL (ref 8.6–10.2)
Chloride: 112 mmol/L — ABNORMAL HIGH (ref 96–106)
Creatinine, Ser: 3.02 mg/dL — ABNORMAL HIGH (ref 0.76–1.27)
GFR calc Af Amer: 24 mL/min/{1.73_m2} — ABNORMAL LOW (ref >59)
GFR calc non Af Amer: 21 mL/min/{1.73_m2} — ABNORMAL LOW (ref >59)
Glucose: 175 mg/dL — ABNORMAL HIGH (ref 65–99)
Potassium: 4.9 mmol/L (ref 3.5–5.2)
Sodium: 145 mmol/L — ABNORMAL HIGH (ref 134–144)

## 2017-05-13 LAB — CBC
Hematocrit: 30.4 % — ABNORMAL LOW (ref 37.5–51.0)
Hemoglobin: 9.6 g/dL — ABNORMAL LOW (ref 13.0–17.7)
MCH: 28.4 pg (ref 26.6–33.0)
MCHC: 31.6 g/dL (ref 31.5–35.7)
MCV: 90 fL (ref 79–97)
Platelets: 373 10*3/uL (ref 150–379)
RBC: 3.38 x10E6/uL — ABNORMAL LOW (ref 4.14–5.80)
RDW: 15.6 % — ABNORMAL HIGH (ref 12.3–15.4)
WBC: 8.8 10*3/uL (ref 3.4–10.8)

## 2017-05-13 NOTE — Telephone Encounter (Signed)
PC placed to pt today to go over lab results. Pt informed that he is still anemic but stable compared to 3 mths ago. Kidney function worsening. 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 Asked pt the name of his kidney specialist that he mentioned yesterday. Actually it was an error as pt said he has never been referred to kidney specialist.  I told him we need to get him in with one. I will submit referral today.

## 2017-05-31 ENCOUNTER — Other Ambulatory Visit: Payer: Self-pay | Admitting: Internal Medicine

## 2017-05-31 DIAGNOSIS — I5042 Chronic combined systolic (congestive) and diastolic (congestive) heart failure: Secondary | ICD-10-CM

## 2017-05-31 DIAGNOSIS — I10 Essential (primary) hypertension: Secondary | ICD-10-CM

## 2017-05-31 MED FILL — CARVEDILOL 12.5 MG TABLET: 12.5 | 30 days supply | Qty: 60 | Fill #0

## 2017-05-31 MED FILL — AMLODIPINE BESYLATE 10 MG T: 10 | 30 days supply | Qty: 30 | Fill #2

## 2017-05-31 MED FILL — FUROSEMIDE 20 MG TABLET: 20 | 30 days supply | Qty: 30 | Fill #0

## 2017-06-07 ENCOUNTER — Other Ambulatory Visit: Payer: Self-pay | Admitting: Pharmacist

## 2017-06-07 DIAGNOSIS — I5042 Chronic combined systolic (congestive) and diastolic (congestive) heart failure: Secondary | ICD-10-CM

## 2017-06-07 MED ORDER — ATORVASTATIN CALCIUM 20 MG PO TABS: 20.0000 mg | ORAL_TABLET | Freq: Every day | ORAL | 0 refills | Status: DC

## 2017-06-08 ENCOUNTER — Other Ambulatory Visit: Payer: Self-pay

## 2017-06-08 DIAGNOSIS — R569 Unspecified convulsions: Secondary | ICD-10-CM

## 2017-06-08 MED ORDER — LACOSAMIDE 200 MG PO TABS: 200.0000 mg | ORAL_TABLET | Freq: Two times a day (BID) | ORAL | 0 refills | Status: DC

## 2017-06-09 NOTE — Telephone Encounter (Signed)
Faxed printed/signed rx lacosamide to [facility] at [phone]. Received fax confirmation.

## 2017-06-21 ENCOUNTER — Other Ambulatory Visit: Payer: Self-pay | Admitting: Pharmacist

## 2017-06-21 DIAGNOSIS — E1100 Type 2 diabetes mellitus with hyperosmolarity without nonketotic hyperglycemic-hyperosmolar coma (NKHHC): Secondary | ICD-10-CM

## 2017-06-21 MED ORDER — INSULIN GLARGINE 100 UNIT/ML SOLOSTAR PEN: 10.0000 [IU] | PEN_INJECTOR | SUBCUTANEOUS | 3 refills | Status: DC

## 2017-07-08 ENCOUNTER — Ambulatory Visit: Payer: Medicare Other | Admitting: Internal Medicine

## 2017-07-12 ENCOUNTER — Ambulatory Visit: Payer: Medicare Other | Attending: Internal Medicine | Admitting: Internal Medicine

## 2017-07-12 ENCOUNTER — Encounter: Payer: Self-pay | Admitting: Internal Medicine

## 2017-07-12 VITALS — BP 144/69 | HR 84 | Temp 98.2°F | Resp 16 | Wt 236.0 lb

## 2017-07-12 DIAGNOSIS — E118 Type 2 diabetes mellitus with unspecified complications: Secondary | ICD-10-CM | POA: Diagnosis not present

## 2017-07-12 DIAGNOSIS — E1100 Type 2 diabetes mellitus with hyperosmolarity without nonketotic hyperglycemic-hyperosmolar coma (NKHHC): Secondary | ICD-10-CM

## 2017-07-12 DIAGNOSIS — Z8489 Family history of other specified conditions: Secondary | ICD-10-CM | POA: Diagnosis not present

## 2017-07-12 DIAGNOSIS — Z87891 Personal history of nicotine dependence: Secondary | ICD-10-CM | POA: Insufficient documentation

## 2017-07-12 DIAGNOSIS — Z7982 Long term (current) use of aspirin: Secondary | ICD-10-CM | POA: Insufficient documentation

## 2017-07-12 DIAGNOSIS — I130 Hypertensive heart and chronic kidney disease with heart failure and stage 1 through stage 4 chronic kidney disease, or unspecified chronic kidney disease: Secondary | ICD-10-CM | POA: Diagnosis not present

## 2017-07-12 DIAGNOSIS — Z833 Family history of diabetes mellitus: Secondary | ICD-10-CM | POA: Diagnosis not present

## 2017-07-12 DIAGNOSIS — I5032 Chronic diastolic (congestive) heart failure: Secondary | ICD-10-CM | POA: Insufficient documentation

## 2017-07-12 DIAGNOSIS — I10 Essential (primary) hypertension: Secondary | ICD-10-CM

## 2017-07-12 DIAGNOSIS — Z89512 Acquired absence of left leg below knee: Secondary | ICD-10-CM | POA: Insufficient documentation

## 2017-07-12 DIAGNOSIS — K219 Gastro-esophageal reflux disease without esophagitis: Secondary | ICD-10-CM | POA: Insufficient documentation

## 2017-07-12 DIAGNOSIS — Z9081 Acquired absence of spleen: Secondary | ICD-10-CM | POA: Insufficient documentation

## 2017-07-12 DIAGNOSIS — D631 Anemia in chronic kidney disease: Secondary | ICD-10-CM | POA: Insufficient documentation

## 2017-07-12 DIAGNOSIS — Z794 Long term (current) use of insulin: Secondary | ICD-10-CM | POA: Insufficient documentation

## 2017-07-12 DIAGNOSIS — G40909 Epilepsy, unspecified, not intractable, without status epilepticus: Secondary | ICD-10-CM | POA: Diagnosis not present

## 2017-07-12 DIAGNOSIS — E1122 Type 2 diabetes mellitus with diabetic chronic kidney disease: Secondary | ICD-10-CM | POA: Insufficient documentation

## 2017-07-12 DIAGNOSIS — N184 Chronic kidney disease, stage 4 (severe): Secondary | ICD-10-CM | POA: Diagnosis not present

## 2017-07-12 DIAGNOSIS — Z79899 Other long term (current) drug therapy: Secondary | ICD-10-CM | POA: Insufficient documentation

## 2017-07-12 DIAGNOSIS — R269 Unspecified abnormalities of gait and mobility: Secondary | ICD-10-CM

## 2017-07-12 DIAGNOSIS — Z9889 Other specified postprocedural states: Secondary | ICD-10-CM | POA: Insufficient documentation

## 2017-07-12 LAB — GLUCOSE, POCT (MANUAL RESULT ENTRY): POC Glucose: 145 mg/dl — AB (ref 70–99)
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
## 2017-07-15 ENCOUNTER — Ambulatory Visit: Payer: Medicare Other | Admitting: Physical Therapy

## 2017-07-15 ENCOUNTER — Other Ambulatory Visit: Payer: Self-pay | Admitting: Pharmacist

## 2017-07-15 DIAGNOSIS — I5042 Chronic combined systolic (congestive) and diastolic (congestive) heart failure: Secondary | ICD-10-CM

## 2017-07-15 DIAGNOSIS — I10 Essential (primary) hypertension: Secondary | ICD-10-CM

## 2017-07-15 MED ORDER — AMLODIPINE BESYLATE 10 MG PO TABS
10.0000 mg | ORAL_TABLET | Freq: Every day | ORAL | 0 refills | Status: DC
Start: 2017-07-15 — End: 2017-11-08

## 2017-07-15 MED ORDER — CARVEDILOL 12.5 MG PO TABS: ORAL_TABLET | ORAL | 0 refills | Status: DC

## 2017-07-15 MED ORDER — FUROSEMIDE 20 MG PO TABS: 20.0000 mg | ORAL_TABLET | Freq: Every day | ORAL | 0 refills | Status: DC

## 2017-07-26 ENCOUNTER — Other Ambulatory Visit: Payer: Self-pay | Admitting: Internal Medicine

## 2017-07-26 DIAGNOSIS — I5042 Chronic combined systolic (congestive) and diastolic (congestive) heart failure: Secondary | ICD-10-CM

## 2017-08-03 ENCOUNTER — Ambulatory Visit: Payer: Medicare Other | Attending: Internal Medicine | Admitting: Physical Therapy

## 2017-08-03 ENCOUNTER — Encounter: Payer: Self-pay | Admitting: Physical Therapy

## 2017-08-03 DIAGNOSIS — R2689 Other abnormalities of gait and mobility: Secondary | ICD-10-CM | POA: Insufficient documentation

## 2017-08-03 DIAGNOSIS — M6281 Muscle weakness (generalized): Secondary | ICD-10-CM | POA: Diagnosis not present

## 2017-08-03 NOTE — Therapy (Signed)
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  (bathing, toileting, feeding, dressing, grooming, moving from room to room)                                                             [x] Yes [] No Will mobility device sufficiently improve ability to participate and/or be aided in participation of MRADL's?         [x] Yes [] No Can limitation be compensated for with use of a cane or walker?                                                                                [] Yes [x] No Does patient or caregiver demonstrate ability/potential ability & willingness to safely use the mobility device?   [x] Yes [] No Does patient's home environment support use of recommended mobility device?                                                     [x] Yes [] No Does patient have sufficient upper extremity function necessary to functionally propel a manual wheelchair?    []  Yes [x] No Does patient have sufficient strength and trunk stability to safely operate a POV (scooter)?                                  [] Yes [x] No Does patient need additional features/benefits provided by a power wheelchair for MRADL's in the home?       [x] Yes [] No Does the patient demonstrate the ability to safely use a power wheelchair?                                                              [x] Yes [] No  Therapist Name Printed: Guido Sander, PT Date: 08-03-17  Therapist's Signature:   Date:   Supplier's Name Printed: Felton Clinton, ATP Date: 08-03-17  Supplier's Signature:   Date:  Patient/Caregiver Signature:   Date:     This is to certify that I have read this evaluation and do agree with the content within:      Physician's Name Printed: Karle Plumber, MD  Physician's Signature:  Date:     This is to certify that I, the above signed therapist have the following affiliations: [] This DME provider [] Manufacturer of recommended equipment [] Patient's long term care facility [x] None of the above                                Plan - 08/03/17 1239    Clinical Impression Statement  Pt is a 66 year old gentleman with Lt BKA, s/p ORIF due to Rt hip fracture due to fall and Rt shoulder impingement; pt presents to PT for power wheelchair evaluation; Josh Cadle, ATP from [facility] present for eval    PT Frequency  One time visit    PT Treatment/Interventions  -- wheelchair management    PT Next Visit Plan  wheelchair eval only with [facility] (Josh Cadle, ATP) - recommend group 2        Patient will benefit from skilled therapeutic intervention in order to improve the following deficits and impairments:     Visit Diagnosis: Other abnormalities of gait and mobility - Plan: PT plan of care cert/re-cert  Muscle weakness  (generalized) - Plan: PT plan of care cert/re-cert  G-Codes - 93/90/30 1243    Functional Assessment Tool Used (Outpatient Only)  Pt using RW and prosthesis on LLE    Functional Limitation  Mobility: Walking and moving around    Mobility: Walking and Moving Around Current Status [phone])  At least 60 percent but less than 80 percent impaired, limited or restricted    Mobility: Walking and Moving Around Goal Status [phone])  At least 60 percent but less than 80 percent impaired, limited or restricted    Mobility: Walking and Moving Around Discharge Status [phone])  At least 60 percent but less than 80 percent impaired, limited or restricted        Problem List Patient Active Problem List   Diagnosis Date Noted  . Acute blood loss as cause of postoperative anemia 01/02/2017  . Hip fracture ([place]) 01/01/2017  . Impingement syndrome of right shoulder 10/25/2016  . Incisional hernia 07/14/2016  .  IDA (iron deficiency anemia) 03/08/2016  . Seizure disorder ([place]) 01/01/2016  . History of Clostridium difficile colitis 01/01/2016  . Positive for microalbuminuria 08/18/2015  . CKD (chronic kidney disease) stage 3, GFR 30-59 ml/min (HCC) 08/18/2015  . GERD (gastroesophageal reflux disease) 04/30/2015  . Tinea pedis 04/30/2015  . Onychomycosis of toenail 04/30/2015  . Malnutrition of moderate degree ([place]) 04/17/2015  . Diabetes mellitus, type 2 ([place]) 04/16/2015  . Chronic diastolic CHF (congestive heart failure) ([place]) 10/18/2014  . Essential hypertension 04/08/2014  . S/P BKA (below knee amputation) ([place]) 11/21/2013    Makara Lanzo, [place], PT 08/03/2017, 12:52 PM  [facility] [address] [place], [place], [address] Phone: [phone]   Fax:  [phone]  Name: NEEL BUFFONE MRN: [id number] Date of Birth: April 21, 1951

## 2017-08-08 ENCOUNTER — Ambulatory Visit: Payer: Medicare Other | Admitting: Podiatry

## 2017-08-12 ENCOUNTER — Ambulatory Visit (INDEPENDENT_AMBULATORY_CARE_PROVIDER_SITE_OTHER): Payer: Medicare Other | Admitting: Podiatry

## 2017-08-12 ENCOUNTER — Encounter: Payer: Self-pay | Admitting: Podiatry

## 2017-08-12 DIAGNOSIS — L97511 Non-pressure chronic ulcer of other part of right foot limited to breakdown of skin: Secondary | ICD-10-CM | POA: Diagnosis not present

## 2017-08-12 DIAGNOSIS — B351 Tinea unguium: Secondary | ICD-10-CM

## 2017-08-12 DIAGNOSIS — M79674 Pain in right toe(s): Secondary | ICD-10-CM

## 2017-08-12 DIAGNOSIS — M79675 Pain in left toe(s): Secondary | ICD-10-CM

## 2017-08-12 DIAGNOSIS — M2041 Other hammer toe(s) (acquired), right foot: Secondary | ICD-10-CM

## 2017-08-12 NOTE — Progress Notes (Signed)
This patient presents to the office today for continued evaluation and treatment of his toenails on his right foot.  His nails are painful walking and wearing his shoes.  He is a history of being a diabetic with a below-knee amputation left.  He also has developed a painful callus noted on the fourth toe of the right foot.  He presents the office today for an evaluation and treatment of his right foot.   General Appearance  Alert, conversant and in no acute stress.  Vascular  Dorsalis pedis and posterior pulses are palpable  bilaterally.  Capillary return is within normal limits  Bilaterally. Temperature is within normal limits  Bilaterally  Neurologic  Senn-Weinstein monofilament wire test within normal limits  bilaterally. Muscle power  Within normal limits bilaterally.  Nails Thick disfigured discolored nails with subungual debris right  from hallux to fifth toes bilaterally. No evidence of bacterial infection or drainage bilaterally.  Orthopedic  No limitations of motion of motion feet bilaterally.  No crepitus or effusions noted.  Hallux malleus IPJ right.  Hammer toes 2-5  Right.  Skin  normotropic skin with no porokeratosis noted bilaterally.  Heloma durum, fourth digit of the right foot at the level of the PIPJ.  There is a small skin lesion distal to the heloma durum fourth toe of the right with no evidence of any pus drainage or infection.   Onychomycosis right  Heloma durum with ulcer fourth digit right foot.  Debride nails right.  Debride heloma durum 4th toe right foot revealing small ulcerated skin lesion caused by the thickness of the HD.  I believe the HD caused the skin lesion because he wears compression socks which are difficult to put on. Neosporin/DSD fourth toe right.  Told him to continue home soaks.  Patient needs to bandage the skin lesion after each soak.  Patient was told to return to the office if this toe becomes a problem.   Otherwise his return to the office in 10  weeks for continued preventative foot care services   Gardiner Barefoot DPM

## 2017-08-16 ENCOUNTER — Ambulatory Visit: Payer: Medicare Other | Attending: Internal Medicine | Admitting: Internal Medicine

## 2017-08-16 ENCOUNTER — Encounter: Payer: Self-pay | Admitting: Internal Medicine

## 2017-08-16 VITALS — BP 162/83 | HR 68 | Temp 98.4°F | Resp 16 | Wt 237.2 lb

## 2017-08-16 DIAGNOSIS — M7542 Impingement syndrome of left shoulder: Secondary | ICD-10-CM | POA: Diagnosis not present

## 2017-08-16 DIAGNOSIS — Z9181 History of falling: Secondary | ICD-10-CM

## 2017-08-16 DIAGNOSIS — R296 Repeated falls: Secondary | ICD-10-CM | POA: Diagnosis not present

## 2017-08-16 DIAGNOSIS — Z89512 Acquired absence of left leg below knee: Secondary | ICD-10-CM

## 2017-08-16 DIAGNOSIS — R269 Unspecified abnormalities of gait and mobility: Secondary | ICD-10-CM | POA: Diagnosis not present

## 2017-08-16 DIAGNOSIS — M7541 Impingement syndrome of right shoulder: Secondary | ICD-10-CM | POA: Diagnosis not present

## 2017-08-16 MED ORDER — DICLOFENAC SODIUM 1 % TD GEL: 2.0000 g | Freq: Four times a day (QID) | TRANSDERMAL | 3 refills | Status: DC

## 2017-08-16 NOTE — Patient Instructions (Signed)
Use the Voltaren gel as needed on the shoulder.
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
## 2017-08-17 ENCOUNTER — Encounter: Payer: Self-pay | Admitting: Pharmacist

## 2017-08-17 NOTE — Progress Notes (Signed)
PA submitted for diclofenac gel to [facility]. Pending review.

## 2017-08-19 MED FILL — ACCU-CHEK AVIVA PLUS TEST S: 30 days supply | Qty: 100 | Fill #2

## 2017-08-31 ENCOUNTER — Encounter (HOSPITAL_COMMUNITY): Payer: Self-pay | Admitting: Emergency Medicine

## 2017-08-31 ENCOUNTER — Inpatient Hospital Stay (HOSPITAL_COMMUNITY)
Admission: EM | Admit: 2017-08-31 | Discharge: 2017-09-14 | DRG: 853 | Disposition: A | Discharge status: Skilled Nursing Facility | Payer: Medicare Other | Attending: Internal Medicine | Admitting: Internal Medicine

## 2017-08-31 ENCOUNTER — Emergency Department (HOSPITAL_COMMUNITY): Payer: Medicare Other

## 2017-08-31 ENCOUNTER — Other Ambulatory Visit: Payer: Self-pay

## 2017-08-31 DIAGNOSIS — E1151 Type 2 diabetes mellitus with diabetic peripheral angiopathy without gangrene: Secondary | ICD-10-CM | POA: Diagnosis present

## 2017-08-31 DIAGNOSIS — E11621 Type 2 diabetes mellitus with foot ulcer: Secondary | ICD-10-CM | POA: Diagnosis not present

## 2017-08-31 DIAGNOSIS — Z79899 Other long term (current) drug therapy: Secondary | ICD-10-CM

## 2017-08-31 DIAGNOSIS — B9562 Methicillin resistant Staphylococcus aureus infection as the cause of diseases classified elsewhere: Secondary | ICD-10-CM | POA: Diagnosis present

## 2017-08-31 DIAGNOSIS — A4902 Methicillin resistant Staphylococcus aureus infection, unspecified site: Secondary | ICD-10-CM | POA: Diagnosis not present

## 2017-08-31 DIAGNOSIS — M868X7 Other osteomyelitis, ankle and foot: Secondary | ICD-10-CM | POA: Diagnosis not present

## 2017-08-31 DIAGNOSIS — L03115 Cellulitis of right lower limb: Secondary | ICD-10-CM | POA: Diagnosis not present

## 2017-08-31 DIAGNOSIS — M869 Osteomyelitis, unspecified: Secondary | ICD-10-CM

## 2017-08-31 DIAGNOSIS — M86171 Other acute osteomyelitis, right ankle and foot: Secondary | ICD-10-CM | POA: Diagnosis not present

## 2017-08-31 DIAGNOSIS — I10 Essential (primary) hypertension: Secondary | ICD-10-CM | POA: Diagnosis not present

## 2017-08-31 DIAGNOSIS — Z794 Long term (current) use of insulin: Secondary | ICD-10-CM | POA: Diagnosis not present

## 2017-08-31 DIAGNOSIS — Z9714 Presence of artificial left leg (complete) (partial): Secondary | ICD-10-CM | POA: Diagnosis not present

## 2017-08-31 DIAGNOSIS — K219 Gastro-esophageal reflux disease without esophagitis: Secondary | ICD-10-CM | POA: Diagnosis present

## 2017-08-31 DIAGNOSIS — L97519 Non-pressure chronic ulcer of other part of right foot with unspecified severity: Secondary | ICD-10-CM | POA: Diagnosis present

## 2017-08-31 DIAGNOSIS — N186 End stage renal disease: Secondary | ICD-10-CM

## 2017-08-31 DIAGNOSIS — E785 Hyperlipidemia, unspecified: Secondary | ICD-10-CM | POA: Diagnosis not present

## 2017-08-31 DIAGNOSIS — N183 Chronic kidney disease, stage 3 unspecified: Secondary | ICD-10-CM | POA: Diagnosis present

## 2017-08-31 DIAGNOSIS — E1169 Type 2 diabetes mellitus with other specified complication: Secondary | ICD-10-CM | POA: Diagnosis not present

## 2017-08-31 DIAGNOSIS — G40909 Epilepsy, unspecified, not intractable, without status epilepticus: Secondary | ICD-10-CM | POA: Diagnosis present

## 2017-08-31 DIAGNOSIS — R05 Cough: Secondary | ICD-10-CM

## 2017-08-31 DIAGNOSIS — N170 Acute kidney failure with tubular necrosis: Secondary | ICD-10-CM | POA: Diagnosis not present

## 2017-08-31 DIAGNOSIS — I130 Hypertensive heart and chronic kidney disease with heart failure and stage 1 through stage 4 chronic kidney disease, or unspecified chronic kidney disease: Secondary | ICD-10-CM | POA: Diagnosis not present

## 2017-08-31 DIAGNOSIS — Z89512 Acquired absence of left leg below knee: Secondary | ICD-10-CM

## 2017-08-31 DIAGNOSIS — E1142 Type 2 diabetes mellitus with diabetic polyneuropathy: Secondary | ICD-10-CM | POA: Diagnosis not present

## 2017-08-31 DIAGNOSIS — S72001D Fracture of unspecified part of neck of right femur, subsequent encounter for closed fracture with routine healing: Secondary | ICD-10-CM | POA: Diagnosis not present

## 2017-08-31 DIAGNOSIS — K5900 Constipation, unspecified: Secondary | ICD-10-CM | POA: Diagnosis not present

## 2017-08-31 DIAGNOSIS — M009 Pyogenic arthritis, unspecified: Secondary | ICD-10-CM | POA: Diagnosis present

## 2017-08-31 DIAGNOSIS — E1121 Type 2 diabetes mellitus with diabetic nephropathy: Secondary | ICD-10-CM | POA: Diagnosis present

## 2017-08-31 DIAGNOSIS — R262 Difficulty in walking, not elsewhere classified: Secondary | ICD-10-CM | POA: Diagnosis not present

## 2017-08-31 DIAGNOSIS — I5032 Chronic diastolic (congestive) heart failure: Secondary | ICD-10-CM | POA: Diagnosis present

## 2017-08-31 DIAGNOSIS — A419 Sepsis, unspecified organism: Secondary | ICD-10-CM | POA: Diagnosis not present

## 2017-08-31 DIAGNOSIS — L02611 Cutaneous abscess of right foot: Secondary | ICD-10-CM | POA: Diagnosis not present

## 2017-08-31 DIAGNOSIS — I110 Hypertensive heart disease with heart failure: Secondary | ICD-10-CM | POA: Diagnosis not present

## 2017-08-31 DIAGNOSIS — Z978 Presence of other specified devices: Secondary | ICD-10-CM | POA: Diagnosis not present

## 2017-08-31 DIAGNOSIS — D473 Essential (hemorrhagic) thrombocythemia: Secondary | ICD-10-CM | POA: Diagnosis not present

## 2017-08-31 DIAGNOSIS — E7849 Other hyperlipidemia: Secondary | ICD-10-CM | POA: Diagnosis not present

## 2017-08-31 DIAGNOSIS — Z7982 Long term (current) use of aspirin: Secondary | ICD-10-CM | POA: Diagnosis not present

## 2017-08-31 DIAGNOSIS — Z89421 Acquired absence of other right toe(s): Secondary | ICD-10-CM | POA: Diagnosis not present

## 2017-08-31 DIAGNOSIS — D638 Anemia in other chronic diseases classified elsewhere: Secondary | ICD-10-CM | POA: Diagnosis present

## 2017-08-31 DIAGNOSIS — R059 Cough, unspecified: Secondary | ICD-10-CM

## 2017-08-31 DIAGNOSIS — R0602 Shortness of breath: Secondary | ICD-10-CM | POA: Diagnosis not present

## 2017-08-31 DIAGNOSIS — L97509 Non-pressure chronic ulcer of other part of unspecified foot with unspecified severity: Secondary | ICD-10-CM | POA: Diagnosis not present

## 2017-08-31 DIAGNOSIS — Z87891 Personal history of nicotine dependence: Secondary | ICD-10-CM | POA: Diagnosis not present

## 2017-08-31 DIAGNOSIS — M86172 Other acute osteomyelitis, left ankle and foot: Secondary | ICD-10-CM | POA: Diagnosis not present

## 2017-08-31 DIAGNOSIS — L97518 Non-pressure chronic ulcer of other part of right foot with other specified severity: Secondary | ICD-10-CM | POA: Diagnosis not present

## 2017-08-31 DIAGNOSIS — R079 Chest pain, unspecified: Secondary | ICD-10-CM | POA: Diagnosis not present

## 2017-08-31 DIAGNOSIS — Z89519 Acquired absence of unspecified leg below knee: Secondary | ICD-10-CM

## 2017-08-31 DIAGNOSIS — D72829 Elevated white blood cell count, unspecified: Secondary | ICD-10-CM | POA: Diagnosis not present

## 2017-08-31 DIAGNOSIS — D649 Anemia, unspecified: Secondary | ICD-10-CM | POA: Diagnosis not present

## 2017-08-31 DIAGNOSIS — N179 Acute kidney failure, unspecified: Secondary | ICD-10-CM

## 2017-08-31 DIAGNOSIS — E118 Type 2 diabetes mellitus with unspecified complications: Secondary | ICD-10-CM

## 2017-08-31 DIAGNOSIS — E1122 Type 2 diabetes mellitus with diabetic chronic kidney disease: Secondary | ICD-10-CM

## 2017-08-31 DIAGNOSIS — L0390 Cellulitis, unspecified: Secondary | ICD-10-CM | POA: Diagnosis not present

## 2017-08-31 DIAGNOSIS — E1140 Type 2 diabetes mellitus with diabetic neuropathy, unspecified: Secondary | ICD-10-CM | POA: Diagnosis not present

## 2017-08-31 DIAGNOSIS — E1159 Type 2 diabetes mellitus with other circulatory complications: Secondary | ICD-10-CM | POA: Diagnosis not present

## 2017-08-31 DIAGNOSIS — M6281 Muscle weakness (generalized): Secondary | ICD-10-CM | POA: Diagnosis not present

## 2017-08-31 DIAGNOSIS — I120 Hypertensive chronic kidney disease with stage 5 chronic kidney disease or end stage renal disease: Secondary | ICD-10-CM | POA: Diagnosis not present

## 2017-08-31 DIAGNOSIS — M86179 Other acute osteomyelitis, unspecified ankle and foot: Secondary | ICD-10-CM

## 2017-08-31 LAB — SEDIMENTATION RATE: Sed Rate: 126 mm/hr — ABNORMAL HIGH (ref 0–16)

## 2017-08-31 LAB — GLUCOSE, CAPILLARY
Glucose-Capillary: 180 mg/dL — ABNORMAL HIGH (ref 65–99)
Glucose-Capillary: 155 mg/dL — ABNORMAL HIGH (ref 65–99)

## 2017-08-31 LAB — URINALYSIS, ROUTINE W REFLEX MICROSCOPIC
Bilirubin Urine: NEGATIVE
Glucose, UA: 50 mg/dL — AB
Ketones, ur: NEGATIVE mg/dL
Leukocytes, UA: NEGATIVE
Nitrite: NEGATIVE
Protein, ur: 100 mg/dL — AB
Specific Gravity, Urine: 1.015 (ref 1.005–1.030)
pH: 5 (ref 5.0–8.0)

## 2017-08-31 LAB — COMPREHENSIVE METABOLIC PANEL
ALT: 14 U/L — ABNORMAL LOW (ref 17–63)
AST: 19 U/L (ref 15–41)
Albumin: 3 g/dL — ABNORMAL LOW (ref 3.5–5.0)
Alkaline Phosphatase: 106 U/L (ref 38–126)
Anion gap: 9 (ref 5–15)
BUN: 57 mg/dL — ABNORMAL HIGH (ref 6–20)
CO2: 17 mmol/L — ABNORMAL LOW (ref 22–32)
Calcium: 8.8 mg/dL — ABNORMAL LOW (ref 8.9–10.3)
Chloride: 110 mmol/L (ref 101–111)
Creatinine, Ser: 3.69 mg/dL — ABNORMAL HIGH (ref 0.61–1.24)
GFR calc Af Amer: 18 mL/min — ABNORMAL LOW (ref >60)
GFR calc non Af Amer: 16 mL/min — ABNORMAL LOW (ref >60)
Glucose, Bld: 169 mg/dL — ABNORMAL HIGH (ref 65–99)
Potassium: 5 mmol/L (ref 3.5–5.1)
Sodium: 136 mmol/L (ref 135–145)
Total Bilirubin: 0.9 mg/dL (ref 0.3–1.2)
Total Protein: 7.8 g/dL (ref 6.5–8.1)

## 2017-08-31 LAB — PREALBUMIN: Prealbumin: 10.1 mg/dL — ABNORMAL LOW (ref 18–38)

## 2017-08-31 LAB — CBC
HCT: 28.6 % — ABNORMAL LOW (ref 39.0–52.0)
Hemoglobin: 9.3 g/dL — ABNORMAL LOW (ref 13.0–17.0)
MCH: 29.6 pg (ref 26.0–34.0)
MCHC: 32.5 g/dL (ref 30.0–36.0)
MCV: 91.1 fL (ref 78.0–100.0)
Platelets: 373 10*3/uL (ref 150–400)
RBC: 3.14 MIL/uL — ABNORMAL LOW (ref 4.22–5.81)
RDW: 13.3 % (ref 11.5–15.5)
WBC: 33.8 10*3/uL — ABNORMAL HIGH (ref 4.0–10.5)

## 2017-08-31 LAB — CBC WITH DIFFERENTIAL/PLATELET
Basophils Absolute: 0 10*3/uL (ref 0.0–0.1)
Basophils Relative: 0 %
Eosinophils Absolute: 0 10*3/uL (ref 0.0–0.7)
Eosinophils Relative: 0 %
HCT: 28.3 % — ABNORMAL LOW (ref 39.0–52.0)
Hemoglobin: 9.5 g/dL — ABNORMAL LOW (ref 13.0–17.0)
Lymphocytes Relative: 5 %
Lymphs Abs: 1.7 10*3/uL (ref 0.7–4.0)
MCH: 30.1 pg (ref 26.0–34.0)
MCHC: 33.6 g/dL (ref 30.0–36.0)
MCV: 89.6 fL (ref 78.0–100.0)
Monocytes Absolute: 3.3 10*3/uL — ABNORMAL HIGH (ref 0.1–1.0)
Monocytes Relative: 10 %
Neutro Abs: 28.1 10*3/uL — ABNORMAL HIGH (ref 1.7–7.7)
Neutrophils Relative %: 85 %
Platelets: 354 10*3/uL (ref 150–400)
RBC: 3.16 MIL/uL — ABNORMAL LOW (ref 4.22–5.81)
RDW: 13.1 % (ref 11.5–15.5)
WBC: 33.1 10*3/uL — ABNORMAL HIGH (ref 4.0–10.5)

## 2017-08-31 LAB — CREATININE, SERUM
Creatinine, Ser: 3.5 mg/dL — ABNORMAL HIGH (ref 0.61–1.24)
GFR calc Af Amer: 20 mL/min — ABNORMAL LOW (ref >60)
GFR calc non Af Amer: 17 mL/min — ABNORMAL LOW (ref >60)

## 2017-08-31 LAB — HEMOGLOBIN A1C
Hgb A1c MFr Bld: 7.1 % — ABNORMAL HIGH (ref 4.8–5.6)
Mean Plasma Glucose: 157.07 mg/dL

## 2017-08-31 LAB — I-STAT CG4 LACTIC ACID, ED
Lactic Acid, Venous: 0.79 mmol/L (ref 0.5–1.9)
Lactic Acid, Venous: 0.8 mmol/L (ref 0.5–1.9)

## 2017-08-31 LAB — PHOSPHORUS: Phosphorus: 3.3 mg/dL (ref 2.5–4.6)

## 2017-08-31 LAB — MAGNESIUM: Magnesium: 1.8 mg/dL (ref 1.7–2.4)

## 2017-08-31 LAB — C-REACTIVE PROTEIN: CRP: 28 mg/dL — ABNORMAL HIGH (ref <1.0)

## 2017-08-31 LAB — CBG MONITORING, ED: Glucose-Capillary: 155 mg/dL — ABNORMAL HIGH (ref 65–99)

## 2017-08-31 MED ORDER — HEPARIN SODIUM (PORCINE) 5000 UNIT/ML IJ SOLN
5000.0000 [IU] | Freq: Three times a day (TID) | INTRAMUSCULAR | Status: DC
  Administered 2017-08-31 – 2017-09-01 (×3): 5000 [IU] via SUBCUTANEOUS
  Filled 2017-08-31 (×3): qty 1

## 2017-08-31 MED ORDER — ONDANSETRON HCL 4 MG PO TABS: 4.0000 mg | ORAL_TABLET | Freq: Four times a day (QID) | ORAL | Status: DC | PRN

## 2017-08-31 MED ORDER — INSULIN ASPART 100 UNIT/ML ~~LOC~~ SOLN
0.0000 [IU] | Freq: Three times a day (TID) | SUBCUTANEOUS | Status: DC
  Administered 2017-08-31 – 2017-09-01 (×2): 3 [IU] via SUBCUTANEOUS
  Administered 2017-09-01: 5 [IU] via SUBCUTANEOUS
  Administered 2017-09-01 – 2017-09-02 (×2): 3 [IU] via SUBCUTANEOUS
  Administered 2017-09-02: 5 [IU] via SUBCUTANEOUS
  Administered 2017-09-03: 3 [IU] via SUBCUTANEOUS
  Administered 2017-09-03 (×2): 5 [IU] via SUBCUTANEOUS
  Administered 2017-09-04 (×2): 3 [IU] via SUBCUTANEOUS
  Administered 2017-09-04: 5 [IU] via SUBCUTANEOUS
  Administered 2017-09-05 (×3): 3 [IU] via SUBCUTANEOUS
  Administered 2017-09-06 (×2): 2 [IU] via SUBCUTANEOUS
  Administered 2017-09-06: 3 [IU] via SUBCUTANEOUS
  Administered 2017-09-07: 2 [IU] via SUBCUTANEOUS
  Administered 2017-09-07: 5 [IU] via SUBCUTANEOUS
  Administered 2017-09-07: 3 [IU] via SUBCUTANEOUS
  Administered 2017-09-08: 2 [IU] via SUBCUTANEOUS
  Administered 2017-09-08: 3 [IU] via SUBCUTANEOUS
  Administered 2017-09-09: 2 [IU] via SUBCUTANEOUS
  Administered 2017-09-09 (×2): 3 [IU] via SUBCUTANEOUS
  Administered 2017-09-10: 5 [IU] via SUBCUTANEOUS
  Administered 2017-09-10: 1 [IU] via SUBCUTANEOUS
  Administered 2017-09-11: 5 [IU] via SUBCUTANEOUS
  Administered 2017-09-11: 2 [IU] via SUBCUTANEOUS
  Administered 2017-09-12 – 2017-09-13 (×2): 3 [IU] via SUBCUTANEOUS
  Administered 2017-09-13 – 2017-09-14 (×3): 2 [IU] via SUBCUTANEOUS

## 2017-08-31 MED ORDER — POLYETHYLENE GLYCOL 3350 17 G PO PACK
17.0000 g | PACK | Freq: Every day | ORAL | Status: DC | PRN
  Administered 2017-09-04: 17 g via ORAL
  Filled 2017-08-31 (×3): qty 1

## 2017-08-31 MED ORDER — VANCOMYCIN HCL IN DEXTROSE 1-5 GM/200ML-% IV SOLN
1000.0000 mg | Freq: Once | INTRAVENOUS | Status: AC
  Administered 2017-08-31: 1000 mg via INTRAVENOUS
  Filled 2017-08-31: qty 200

## 2017-08-31 MED ORDER — ACETAMINOPHEN 325 MG PO TABS
650.0000 mg | ORAL_TABLET | Freq: Four times a day (QID) | ORAL | Status: DC | PRN
  Administered 2017-08-31 – 2017-09-09 (×5): 650 mg via ORAL
  Filled 2017-08-31 (×6): qty 2

## 2017-08-31 MED ORDER — SODIUM CHLORIDE 0.9 % IV BOLUS (SEPSIS)
500.0000 mL | Freq: Once | INTRAVENOUS | Status: AC
  Administered 2017-08-31: 500 mL via INTRAVENOUS

## 2017-08-31 MED ORDER — LACOSAMIDE 50 MG PO TABS
200.0000 mg | ORAL_TABLET | Freq: Two times a day (BID) | ORAL | Status: DC
  Administered 2017-08-31 – 2017-09-14 (×28): 200 mg via ORAL
  Filled 2017-08-31 (×29): qty 4

## 2017-08-31 MED ORDER — CARVEDILOL 12.5 MG PO TABS
12.5000 mg | ORAL_TABLET | Freq: Two times a day (BID) | ORAL | Status: DC
  Administered 2017-08-31 – 2017-09-14 (×27): 12.5 mg via ORAL
  Filled 2017-08-31 (×27): qty 1

## 2017-08-31 MED ORDER — BISACODYL 5 MG PO TBEC
5.0000 mg | DELAYED_RELEASE_TABLET | Freq: Every day | ORAL | Status: DC | PRN
  Administered 2017-09-05 – 2017-09-09 (×5): 5 mg via ORAL
  Filled 2017-08-31 (×5): qty 1

## 2017-08-31 MED ORDER — FUROSEMIDE 40 MG PO TABS
20.0000 mg | ORAL_TABLET | Freq: Every day | ORAL | Status: DC
  Administered 2017-08-31: 20 mg via ORAL
  Filled 2017-08-31: qty 1

## 2017-08-31 MED ORDER — ACETAMINOPHEN 650 MG RE SUPP: 650.0000 mg | Freq: Four times a day (QID) | RECTAL | Status: DC | PRN

## 2017-08-31 MED ORDER — AMLODIPINE BESYLATE 10 MG PO TABS
10.0000 mg | ORAL_TABLET | Freq: Every day | ORAL | Status: DC
  Administered 2017-08-31 – 2017-09-14 (×15): 10 mg via ORAL
  Filled 2017-08-31 (×3): qty 1
  Filled 2017-08-31: qty 2
  Filled 2017-08-31 (×6): qty 1
  Filled 2017-08-31 (×2): qty 2
  Filled 2017-08-31 (×3): qty 1

## 2017-08-31 MED ORDER — ONDANSETRON HCL 4 MG/2ML IJ SOLN
4.0000 mg | Freq: Four times a day (QID) | INTRAMUSCULAR | Status: DC | PRN
  Administered 2017-09-02 (×2): 4 mg via INTRAVENOUS
  Filled 2017-08-31: qty 2

## 2017-08-31 MED ORDER — ATORVASTATIN CALCIUM 10 MG PO TABS
20.0000 mg | ORAL_TABLET | Freq: Every day | ORAL | Status: DC
  Administered 2017-08-31 – 2017-09-14 (×15): 20 mg via ORAL
  Filled 2017-08-31 (×2): qty 2
  Filled 2017-08-31: qty 1
  Filled 2017-08-31 (×6): qty 2
  Filled 2017-08-31: qty 1
  Filled 2017-08-31 (×2): qty 2
  Filled 2017-08-31: qty 1
  Filled 2017-08-31 (×5): qty 2

## 2017-08-31 MED ORDER — CEFEPIME HCL 2 G IJ SOLR
2.0000 g | Freq: Once | INTRAMUSCULAR | Status: AC
  Administered 2017-08-31: 2 g via INTRAVENOUS
  Filled 2017-08-31: qty 2

## 2017-08-31 MED ORDER — DEXTROSE 5 % IV SOLN
1.0000 g | INTRAVENOUS | Status: DC
  Administered 2017-09-01 – 2017-09-05 (×4): 1 g via INTRAVENOUS
  Filled 2017-08-31 (×6): qty 1

## 2017-08-31 MED ORDER — ASPIRIN EC 81 MG PO TBEC
81.0000 mg | DELAYED_RELEASE_TABLET | Freq: Every day | ORAL | Status: DC
  Administered 2017-08-31 – 2017-09-14 (×14): 81 mg via ORAL
  Filled 2017-08-31 (×15): qty 1

## 2017-08-31 MED ORDER — METRONIDAZOLE 500 MG PO TABS
500.0000 mg | ORAL_TABLET | Freq: Three times a day (TID) | ORAL | Status: DC
  Administered 2017-08-31 – 2017-09-06 (×18): 500 mg via ORAL
  Filled 2017-08-31 (×19): qty 1

## 2017-08-31 MED ORDER — VANCOMYCIN HCL 10 G IV SOLR
1250.0000 mg | INTRAVENOUS | Status: DC
  Administered 2017-09-02 – 2017-09-08 (×4): 1250 mg via INTRAVENOUS
  Filled 2017-08-31 (×5): qty 1250

## 2017-08-31 MED ORDER — INSULIN GLARGINE 100 UNIT/ML ~~LOC~~ SOLN
10.0000 [IU] | Freq: Every day | SUBCUTANEOUS | Status: DC
  Administered 2017-08-31 – 2017-09-03 (×4): 10 [IU] via SUBCUTANEOUS
  Filled 2017-08-31 (×4): qty 0.1

## 2017-08-31 MED ORDER — SODIUM CHLORIDE 0.9 % IV SOLN
INTRAVENOUS | Status: DC
  Administered 2017-08-31 – 2017-09-05 (×7): via INTRAVENOUS

## 2017-08-31 MED ORDER — INSULIN ASPART 100 UNIT/ML ~~LOC~~ SOLN
0.0000 [IU] | Freq: Every day | SUBCUTANEOUS | Status: DC
  Administered 2017-09-03 – 2017-09-05 (×3): 2 [IU] via SUBCUTANEOUS
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
## 2017-08-31 NOTE — ED Notes (Signed)
Pt given water per MD Pickering's approval

## 2017-08-31 NOTE — ED Notes (Signed)
ED TO INPATIENT HANDOFF REPORT  Name/Age/Gender Mark [name] 67 y.o. male  Code Status    Code Status Orders  (From admission, onward)        Start     Ordered   08/31/17 1607  Full code  Continuous     08/31/17 1608    Code Status History    Date Active Date Inactive Code Status Order ID Comments User Context   12/31/2016 20:26 01/05/2017 18:41 Full Code [id number]  Etta Quill, DO ED   07/14/2016 20:07 07/15/2016 19:41 Full Code [id number]  Kinsinger, Arta Bruce, MD Inpatient   01/01/2016 21:19 01/08/2016 20:30 Full Code [id number]  Ivor Costa, MD ED   04/18/2015 18:16 04/19/2015 21:05 Full Code [id number]  Donne Hazel, MD ED   04/16/2015 21:50 04/18/2015 02:16 Full Code [id number]  Lavina Hamman, MD Inpatient   10/16/2014 21:55 10/18/2014 16:50 Full Code [id number]  Toy Baker, MD Inpatient   11/21/2013 14:18 11/23/2013 17:44 Full Code [id number]  Newt Minion, MD Inpatient   11/06/2013 16:02 11/08/2013 17:48 Full Code [id number]  Mcarthur Rossetti, MD Inpatient   10/09/2013 16:32 10/17/2013 18:50 Full Code [id number]  Elizabeth Sauer Inpatient   09/26/2013 22:52 10/09/2013 16:32 Full Code [id number]  Kelvin Cellar, MD Inpatient    Advance Directive Documentation     Most Recent Value  Type of Advance Directive  Healthcare Power of Attorney, Living will  Pre-existing out of facility DNR order (yellow form or pink MOST form)  No data  "MOST" Form in Place?  No data      Home/SNF/Other Home  Chief Complaint R. foot swollen; shorntess of breath  Level of Care/Admitting Diagnosis ED Disposition    ED Disposition Condition Comment   Admit  Hospital Area: [facility] [[id number]]  Level of Care: Med-Surg [16]  Diagnosis: Diabetic foot ulcer ([place]) [[id number]]  Admitting Physician: Barton Dubois [[id number]]  Attending Physician: Barton Dubois [[id number]]  Estimated length of stay: past midnight tomorrow  Certification:: I certify this patient will  need inpatient services for at least 2 midnights  PT Class (Do Not Modify): Inpatient [101]  PT Acc Code (Do Not Modify): Private [1]       Medical History Past Medical History:  Diagnosis Date  . Acid indigestion   . Acute encephalopathy 01/01/2016  . Acute renal failure superimposed on stage 3 chronic kidney disease ([place]) 04/16/2015  . Anemia 10/01/2013  . CHF (congestive heart failure) ([place])   . Chronic kidney disease   . CKD (chronic kidney disease) stage 3, GFR 30-59 ml/min ([facility]) 08/18/2015  . Diabetes mellitus, type 2 ([place]) 04/16/2015  . Diverticulitis   . DM (diabetes mellitus), type 2 with peripheral vascular complications ([place])   . Elevated troponin 10/16/2014  . Essential hypertension 04/08/2014  . History of Clostridium difficile colitis 01/01/2016  . Hypertension    no pcp  . Hypothermia 01/01/2016  . Malnutrition of moderate degree ([place]) 04/17/2015  . Onychomycosis of toenail 04/30/2015  . Phantom limb pain ([place]) 12/12/2013  . Positive for microalbuminuria 08/18/2015  . S/P BKA (below knee amputation) ([place]) 11/21/2013   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    . Seizures ([place])   . Spleen absent   . Substance abuse ([place]) 04/02/2016   Cocaine  . Wound infection 01/02/2016    Allergies No Known Allergies  IV Location/Drains/Wounds Patient Lines/Drains/Airways Status   Active Line/Drains/Airways    Name:   Placement date:   Placement time:  Site:   Days:   Peripheral IV 01/02/17 Right;Lateral Forearm   01/02/17    0931    Forearm   241   Peripheral IV 08/31/17 Right Hand   08/31/17    1055    Hand   less than 1   Incision (Closed) 01/01/17 Leg   01/01/17    1748     242   Wound / Incision (Open or Dehisced) 01/01/17 Non-pressure wound Leg Anterior;Right open wound /ulcer 2.5 cm x 2.5 cm   01/01/17    0400    Leg   242          Labs/Imaging Results for orders placed or performed during the hospital encounter of 08/31/17 (from the past 48 hour(s))  Urinalysis, Routine w reflex  microscopic- may I&O cath if menses     Status: Abnormal   Collection Time: 08/31/17  8:18 AM  Result Value Ref Range   Color, Urine YELLOW YELLOW   APPearance CLOUDY (A) CLEAR   Specific Gravity, Urine 1.015 1.005 - 1.030   pH 5.0 5.0 - 8.0   Glucose, UA 50 (A) NEGATIVE mg/dL   Hgb urine dipstick SMALL (A) NEGATIVE   Bilirubin Urine NEGATIVE NEGATIVE   Ketones, ur NEGATIVE NEGATIVE mg/dL   Protein, ur 100 (A) NEGATIVE mg/dL   Nitrite NEGATIVE NEGATIVE   Leukocytes, UA NEGATIVE NEGATIVE   RBC / HPF 0-5 0 - 5 RBC/hpf   WBC, UA 0-5 0 - 5 WBC/hpf   Bacteria, UA RARE (A) NONE SEEN   Squamous Epithelial / LPF 0-5 (A) NONE SEEN  CBC with Differential     Status: Abnormal   Collection Time: 08/31/17 10:50 AM  Result Value Ref Range   WBC 33.1 (H) 4.0 - 10.5 K/uL   RBC 3.16 (L) 4.22 - 5.81 MIL/uL   Hemoglobin 9.5 (L) 13.0 - 17.0 g/dL   HCT 28.3 (L) 39.0 - 52.0 %   MCV 89.6 78.0 - 100.0 fL   MCH 30.1 26.0 - 34.0 pg   MCHC 33.6 30.0 - 36.0 g/dL   RDW 13.1 11.5 - 15.5 %   Platelets 354 150 - 400 K/uL   Neutrophils Relative % 85 %   Lymphocytes Relative 5 %   Monocytes Relative 10 %   Eosinophils Relative 0 %   Basophils Relative 0 %   Neutro Abs 28.1 (H) 1.7 - 7.7 K/uL   Lymphs Abs 1.7 0.7 - 4.0 K/uL   Monocytes Absolute 3.3 (H) 0.1 - 1.0 K/uL   Eosinophils Absolute 0.0 0.0 - 0.7 K/uL   Basophils Absolute 0.0 0.0 - 0.1 K/uL   Smear Review MORPHOLOGY UNREMARKABLE   Comprehensive metabolic panel     Status: Abnormal   Collection Time: 08/31/17 10:50 AM  Result Value Ref Range   Sodium 136 135 - 145 mmol/L   Potassium 5.0 3.5 - 5.1 mmol/L   Chloride 110 101 - 111 mmol/L   CO2 17 (L) 22 - 32 mmol/L   Glucose, Bld 169 (H) 65 - 99 mg/dL   BUN 57 (H) 6 - 20 mg/dL   Creatinine, Ser 3.69 (H) 0.61 - 1.24 mg/dL   Calcium 8.8 (L) 8.9 - 10.3 mg/dL   Total Protein 7.8 6.5 - 8.1 g/dL   Albumin 3.0 (L) 3.5 - 5.0 g/dL   AST 19 15 - 41 U/L   ALT 14 (L) 17 - 63 U/L   Alkaline Phosphatase  106 38 - 126 U/L   Total Bilirubin 0.9 0.3 -  1.2 mg/dL   GFR calc non Af Amer 16 (L) >60 mL/min   GFR calc Af Amer 18 (L) >60 mL/min    Comment: (NOTE) The eGFR has been calculated using the CKD EPI equation. This calculation has not been validated in all clinical situations. eGFR's persistently <60 mL/min signify possible Chronic Kidney Disease.    Anion gap 9 5 - 15  I-Stat CG4 Lactic Acid, ED     Status: None   Collection Time: 08/31/17 10:57 AM  Result Value Ref Range   Lactic Acid, Venous 0.79 0.5 - 1.9 mmol/L  I-Stat CG4 Lactic Acid, ED     Status: None   Collection Time: 08/31/17  1:25 PM  Result Value Ref Range   Lactic Acid, Venous 0.80 0.5 - 1.9 mmol/L   Dg Chest 2 View  Result Date: 08/31/2017 CLINICAL DATA:  Shortness of breath, fever, and cough for the past 4 days. EXAM: CHEST  2 VIEW COMPARISON:  Chest x-ray dated Dec 31, 2016. FINDINGS: The cardiomediastinal silhouette is normal in size. Slightly coarsened interstitial markings, similar to prior study. Minimal left basilar atelectasis. No focal consolidation, pleural effusion, or pneumothorax. No acute osseous abnormality. IMPRESSION: No active cardiopulmonary disease. Electronically Signed   By: Titus Dubin M.D.   On: 08/31/2017 09:25   Dg Foot Complete Right  Result Date: 08/31/2017 CLINICAL DATA:  Diabetic foot ulcer. EXAM: RIGHT FOOT COMPLETE - 3+ VIEW COMPARISON:  04/14/2016 FINDINGS: No acute fracture or dislocations identified. Arthritic changes involving the first metatarsal phalanges joint and at the IP joint of the great toe noted. Again noted is an erosion of the tuft of the distal phalanx of the second toe which may represent an epidermoid inclusion cyst. 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 IMPRESSION: 1. No acute abnormality. 2. Possible epidermoid inclusion cyst involving the tuft of the distal phalanx of the second toe. Electronically Signed   By: Kerby Moors M.D.   On: 08/31/2017 10:24     Pending Labs Unresulted Labs (From admission, onward)   Start     Ordered   09/01/17 8638  Basic metabolic panel  Tomorrow morning,   R     08/31/17 1608   09/01/17 0500  CBC  Tomorrow morning,   R     08/31/17 1608   08/31/17 1608  Magnesium  Once,   R     08/31/17 1608   08/31/17 1608  Phosphorus  Once,   R     08/31/17 1608   08/31/17 1604  CBC  (heparin)  Once,   R    Comments:  Baseline for heparin therapy IF NOT ALREADY DRAWN.  Notify MD if PLT < 100 K.    08/31/17 1608   08/31/17 1604  Creatinine, serum  (heparin)  Once,   R    Comments:  Baseline for heparin therapy IF NOT ALREADY DRAWN.    08/31/17 1608   08/31/17 1600  Hemoglobin A1c  Once,   R     08/31/17 1608   08/31/17 1600  HIV antibody  Once,   R     08/31/17 1608   08/31/17 1600  Sedimentation rate  Once,   R     08/31/17 1608   08/31/17 1600  C-reactive protein  Once,   R     08/31/17 1608   08/31/17 1600  Prealbumin  Once,   R     08/31/17 1608   08/31/17 1600  Blood Cultures x 2 sites  BLOOD CULTURE X 2,   R     08/31/17 1608   08/31/17 1150  Culture, blood (routine x 2)  BLOOD CULTURE X 2,   STAT     08/31/17 1149      Vitals/Pain Today's Vitals   08/31/17 1445 08/31/17 1500 08/31/17 1530 08/31/17 1600  BP:  (!) 148/98 (!) 142/75 (!) 153/72  Pulse: 80 84 77 80  Resp: 18 19 17 15   Temp:      TempSrc:      SpO2: 96% 97% 94% 96%  PainSc:        Isolation Precautions No active isolations  Medications Medications  metroNIDAZOLE (FLAGYL) tablet 500 mg (500 mg Oral Given 08/31/17 1641)  heparin injection 5,000 Units (not administered)  0.9 %  sodium chloride infusion (not administered)  acetaminophen (TYLENOL) tablet 650 mg (not administered)    Or  acetaminophen (TYLENOL) suppository 650 mg (not administered)  ondansetron (ZOFRAN) tablet 4 mg (not administered)    Or  ondansetron (ZOFRAN) injection 4 mg (not administered)  amLODipine (NORVASC) tablet 10 mg (not administered)  aspirin  EC tablet 81 mg (not administered)  atorvastatin (LIPITOR) tablet 20 mg (not administered)  carvedilol (COREG) tablet 12.5 mg (not administered)  bisacodyl (DULCOLAX) EC tablet 5 mg (not administered)  furosemide (LASIX) tablet 20 mg (not administered)  Insulin Glargine (LANTUS) Solostar Pen 10 Units (not administered)  polyethylene glycol (MIRALAX / GLYCOLAX) packet 17 g (not administered)  lacosamide (VIMPAT) tablet 200 mg (not administered)  insulin aspart (novoLOG) injection 0-15 Units (not administered)  insulin aspart (novoLOG) injection 0-5 Units (not administered)  ceFEPIme (MAXIPIME) 2 g in dextrose 5 % 50 mL IVPB (not administered)  vancomycin (VANCOCIN) IVPB 1000 mg/200 mL premix (1,000 mg Intravenous New Bag/Given 08/31/17 1642)  vancomycin (VANCOCIN) 1,250 mg in sodium chloride 0.9 % 250 mL IVPB (not administered)  ceFEPIme (MAXIPIME) 1 g in dextrose 5 % 50 mL IVPB (not administered)  sodium chloride 0.9 % bolus 500 mL (0 mLs Intravenous Stopped 08/31/17 1255)  vancomycin (VANCOCIN) IVPB 1000 mg/200 mL premix (0 mg Intravenous Stopped 08/31/17 1255)    Mobility Walks with his right leg prosthesis

## 2017-08-31 NOTE — Progress Notes (Signed)
A consult was received from an ED physician for vancomycin per pharmacy dosing.  The patient's profile has been reviewed for ht/wt/allergies/indication/available labs.   A one time order has been placed for vanc 1g.  Further antibiotics/pharmacy consults should be ordered by admitting physician if indicated.                       Thank you, Kara Mead 08/31/2017  9:55 AM

## 2017-08-31 NOTE — ED Notes (Signed)
ED Provider at bedside. 
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
## 2017-08-31 NOTE — Progress Notes (Signed)
Pharmacy Antibiotic Note  Mark [name] is a 67 y.o. male admitted on 08/31/2017 with possible diabetic foot infection.  Pharmacy has been consulted for vancomycin and cefepime dosing; MD dosing flagyl.  Vancomycin 1g IV given at 11:32  Plan: Give an additional dose of vancomycin 1g IV x 1 now, then begin 1250mg  IV q48h on 1/4 (estimated AUC 439) Check peak and trough at steady state to calculate AUC (goal 400-500) Cefepime 2g IV x 1 now, then 1g IV q24h Flagyl 500mg  PO q8h per MD    Temp (24hrs), Avg:100.5 F (38.1 C), Min:100.1 F (37.8 C), Max:101 F (38.3 C)  Recent Labs  Lab 08/31/17 1050 08/31/17 1057 08/31/17 1325  WBC 33.1*  --   --   CREATININE 3.69*  --   --   LATICACIDVEN  --  0.79 0.80    CrCl cannot be calculated (Unknown ideal weight.).    No Known Allergies  Antimicrobials this admission:  1/2 Vancomycin >> 1/2 Cefepime >> 1/2 Flagyl >>  Dose adjustments this admission:    Microbiology results:  1/2 BCx: sent  Thank you for allowing pharmacy to be a part of this patient's care.  Peggyann Juba, PharmD, BCPS Pager: [phone] 08/31/2017 4:32 PM

## 2017-08-31 NOTE — Progress Notes (Signed)
New Admission Note:    Arrival Method: Stretcher from ER Mental Orientation: Alert and Oriented Assessment: In progress Skin: Left bka and right foot infection with redness and swelling IV: 20G R hand Pain: 0/10 Safety Measures: Side rails up x 2 and call bell in reach Admission: Completed 5E Orientation: Completed Family:  None at bedside  Orders have been reviewed and implemented.  Will continue to monitor the patient.

## 2017-08-31 NOTE — ED Triage Notes (Addendum)
Pt reports SOB and R foot swelling for the past 2 days. No CP. Also reports lightheadedness.  Pt also reports R flank pain for the past few days. Denies dysuria.

## 2017-09-01 ENCOUNTER — Inpatient Hospital Stay (HOSPITAL_COMMUNITY): Payer: Medicare Other

## 2017-09-01 DIAGNOSIS — M86172 Other acute osteomyelitis, left ankle and foot: Secondary | ICD-10-CM

## 2017-09-01 DIAGNOSIS — E1142 Type 2 diabetes mellitus with diabetic polyneuropathy: Secondary | ICD-10-CM

## 2017-09-01 DIAGNOSIS — E11621 Type 2 diabetes mellitus with foot ulcer: Secondary | ICD-10-CM

## 2017-09-01 LAB — CBC
HCT: 27.1 % — ABNORMAL LOW (ref 39.0–52.0)
Hemoglobin: 8.9 g/dL — ABNORMAL LOW (ref 13.0–17.0)
MCH: 29.7 pg (ref 26.0–34.0)
MCHC: 32.8 g/dL (ref 30.0–36.0)
MCV: 90.3 fL (ref 78.0–100.0)
Platelets: 362 10*3/uL (ref 150–400)
RBC: 3 MIL/uL — ABNORMAL LOW (ref 4.22–5.81)
RDW: 13.5 % (ref 11.5–15.5)
WBC: 33.4 10*3/uL — ABNORMAL HIGH (ref 4.0–10.5)

## 2017-09-01 LAB — GLUCOSE, CAPILLARY
Glucose-Capillary: 178 mg/dL — ABNORMAL HIGH (ref 65–99)
Glucose-Capillary: 169 mg/dL — ABNORMAL HIGH (ref 65–99)
Glucose-Capillary: 208 mg/dL — ABNORMAL HIGH (ref 65–99)
Glucose-Capillary: 194 mg/dL — ABNORMAL HIGH (ref 65–99)

## 2017-09-01 LAB — BASIC METABOLIC PANEL
Anion gap: 7 (ref 5–15)
BUN: 57 mg/dL — ABNORMAL HIGH (ref 6–20)
CO2: 17 mmol/L — ABNORMAL LOW (ref 22–32)
Calcium: 8.4 mg/dL — ABNORMAL LOW (ref 8.9–10.3)
Chloride: 111 mmol/L (ref 101–111)
Creatinine, Ser: 3.8 mg/dL — ABNORMAL HIGH (ref 0.61–1.24)
GFR calc Af Amer: 18 mL/min — ABNORMAL LOW (ref >60)
GFR calc non Af Amer: 15 mL/min — ABNORMAL LOW (ref >60)
Glucose, Bld: 158 mg/dL — ABNORMAL HIGH (ref 65–99)
Potassium: 4.2 mmol/L (ref 3.5–5.1)
Sodium: 135 mmol/L (ref 135–145)

## 2017-09-01 LAB — HIV ANTIBODY (ROUTINE TESTING W REFLEX): HIV Screen 4th Generation wRfx: NONREACTIVE

## 2017-09-01 MED ORDER — ADULT MULTIVITAMIN W/MINERALS CH
1.0000 | ORAL_TABLET | Freq: Every day | ORAL | Status: DC
  Administered 2017-09-01 – 2017-09-14 (×14): 1 via ORAL
  Filled 2017-09-01 (×14): qty 1

## 2017-09-01 MED ORDER — PREMIER PROTEIN SHAKE
11.0000 [oz_av] | Freq: Two times a day (BID) | ORAL | Status: DC
  Administered 2017-09-01 – 2017-09-09 (×11): 11 [oz_av] via ORAL
  Filled 2017-09-01 (×27): qty 325.31

## 2017-09-01 NOTE — Progress Notes (Signed)
Patient ID: Mark [name], male   DOB: 1950/12/30, 67 y.o.   MRN: [id number] Came by to see pt , he is getting MRI. Will review later this AM and see pt this evening. If surgery needed will do Friday,.    My cell [phone]

## 2017-09-01 NOTE — Progress Notes (Signed)
PROGRESS NOTE    Mark [name]  KVQ:[id number] DOB: 01-Jun-1951 DOA: 08/31/2017 PCP: [name], MD   Brief Narrative:   Mark [name] is a 67 year old male with a past medical history significant for hypertension, diabetes, chronic kidney disease is stage III, seizures, left BKA and chronic diastolic heart failure; who presented to the emergency department secondary to right foot redness, swelling and drainage.  Patient also reported subjective fever/chills and no feeling well.  Patient was seen by his podiatrist about 10-12 days ago and had remove a corn on his right fourth toe. Patient was using neosporin and wearing band-aids.  On the day of admission patient noticed increase inflammation and drainage out of his foot and decided to seek medical attention.  MRI of the right foot shows 1. Diffuse cellulitis and myofasciitis. 2. Fluid collections along the dorsum of the forefoot along the fourth digit could be small abscesses. 3. Osteomyelitis involving the proximal and middle phalanges of the fourth toe   Assessment & Plan:   Principal Problem:   Diabetic foot ulcer ([place]) Active Problems:   S/P BKA (below knee amputation) (HCC)   Essential hypertension   Chronic diastolic CHF (congestive heart failure) (HCC)   Diabetes mellitus, type 2 (HCC)   GERD (gastroesophageal reflux disease)   CKD (chronic kidney disease) stage 3, GFR 30-59 ml/min (HCC)   Seizure disorder ([place])   Diabetic right foot ulcer/osteomyelitis of the fourth toe/absence on the dorsum of the right forefoot Orthopedics consulted awaiting recommendations. On vancomycin and cefepime.   Acute on chronic kidney disease stage III Probably secondary to ATN.  Urine electrolytes, watch urine output.  Monitor renal parameters while on vancomycin.  Send urine analysis and culture ultrasound renal if the creatinine continues to worsen.   Chronic diastolic heart failure Does not appear to be  decompensated.  Type 2 diabetes mellitus  CBG (last 3)  Recent Labs    09/01/17 0741 09/01/17 1144 09/01/17 1713  GLUCAP 178* 169* 208*   Suboptimal currently on 10 units of Lantus resume with moderate sliding scale insulin.   Hypertension well-controlled     DVT prophylaxis: Subcu heparin  code Status: Full code Family Communication: None at bedside, discussed the plan with the patient. Disposition Plan: Pending evaluation by orthopedics.   Consultants:   Orthopedics.    Procedures: MRI of the foot.   Antimicrobials:vancomycin and cefepime.   Subjective: Pain well controlled.   Objective: Vitals:   09/01/17 0809 09/01/17 1137 09/01/17 1400 09/01/17 1435  BP: 137/65 (!) 131/57 127/61 135/62  Pulse:   79 81  Resp:   20 20  Temp:   99.8 F (37.7 C) 99.8 F (37.7 C)  TempSrc:   Oral Oral  SpO2:   96% 96%  Weight:      Height:        Intake/Output Summary (Last 24 hours) at 09/01/2017 1803 Last data filed at 09/01/2017 0900 Gross per 24 hour  Intake 1440 ml  Output 800 ml  Net 640 ml   Filed Weights   08/31/17 1725  Weight: 106.6 kg (235 lb)    Examination:  General exam: Appears calm and comfortable  Respiratory system: Clear to auscultation. Respiratory effort normal. Cardiovascular system: S1 & S2 heard, RRR. No JVD, murmurs, rubs, gallops or clicks. No pedal edema. Gastrointestinal system: Abdomen is nondistended, soft and nontender. No organomegaly or masses felt. Normal bowel sounds heard. Central nervous system: Alert and oriented. No focal neurological deficits. Extremities: right foot is  swollen, tender and erythematous.  Skin: 4 th toe abscess.  Psychiatry: Judgement and insight appear normal. Mood & affect appropriate.     Data Reviewed: I have personally reviewed following labs and imaging studies  CBC: Recent Labs  Lab 08/31/17 1050 08/31/17 1730 09/01/17 0545  WBC 33.1* 33.8* 33.4*  NEUTROABS 28.1*  --   --   HGB 9.5* 9.3*  8.9*  HCT 28.3* 28.6* 27.1*  MCV 89.6 91.1 90.3  PLT 354 373 683   Basic Metabolic Panel: Recent Labs  Lab 08/31/17 1050 08/31/17 1730 09/01/17 0545  NA 136  --  135  K 5.0  --  4.2  CL 110  --  111  CO2 17*  --  17*  GLUCOSE 169*  --  158*  BUN 57*  --  57*  CREATININE 3.69* 3.50* 3.80*  CALCIUM 8.8*  --  8.4*  MG  --  1.8  --   PHOS  --  3.3  --    GFR: Estimated Creatinine Clearance: 25.2 mL/min (A) (by C-G formula based on SCr of 3.8 mg/dL (H)). Liver Function Tests: Recent Labs  Lab 08/31/17 1050  AST 19  ALT 14*  ALKPHOS 106  BILITOT 0.9  PROT 7.8  ALBUMIN 3.0*   No results for input(s): LIPASE, AMYLASE in the last 168 hours. No results for input(s): AMMONIA in the last 168 hours. Coagulation Profile: No results for input(s): INR, PROTIME in the last 168 hours. Cardiac Enzymes: No results for input(s): CKTOTAL, CKMB, CKMBINDEX, TROPONINI in the last 168 hours. BNP (last 3 results) No results for input(s): PROBNP in the last 8760 hours. HbA1C: Recent Labs    08/31/17 1730  HGBA1C 7.1*   CBG: Recent Labs  Lab 08/31/17 1748 08/31/17 2119 09/01/17 0741 09/01/17 1144 09/01/17 1713  GLUCAP 180* 155* 178* 169* 208*   Lipid Profile: No results for input(s): CHOL, HDL, LDLCALC, TRIG, CHOLHDL, LDLDIRECT in the last 72 hours. Thyroid Function Tests: No results for input(s): TSH, T4TOTAL, FREET4, T3FREE, THYROIDAB in the last 72 hours. Anemia Panel: No results for input(s): VITAMINB12, FOLATE, FERRITIN, TIBC, IRON, RETICCTPCT in the last 72 hours. Sepsis Labs: Recent Labs  Lab 08/31/17 1057 08/31/17 1325  LATICACIDVEN 0.79 0.80    Recent Results (from the past 240 hour(s))  Culture, blood (routine x 2)     Status: None (Preliminary result)   Collection Time: 08/31/17  1:10 PM  Result Value Ref Range Status   Specimen Description BLOOD RIGHT ANTECUBITAL  Final   Special Requests   Final    BOTTLES DRAWN AEROBIC AND ANAEROBIC Blood Culture  adequate volume   Culture   Final    NO GROWTH < 24 HOURS Performed at [facility], [place] [address] [place], [place] [address]    Report Status PENDING  Incomplete  Culture, blood (routine x 2)     Status: None (Preliminary result)   Collection Time: 08/31/17  1:15 PM  Result Value Ref Range Status   Specimen Description BLOOD LEFT HAND  Final   Special Requests IN PEDIATRIC BOTTLE Blood Culture adequate volume  Final   Culture   Final    NO GROWTH < 24 HOURS Performed at [facility], [place] [address], [place],  [address]    Report Status PENDING  Incomplete  Blood Cultures x 2 sites     Status: None (Preliminary result)   Collection Time: 08/31/17  5:30 PM  Result Value Ref Range Status  Specimen Description BLOOD LEFT ARM  Final   Special Requests   Final    BOTTLES DRAWN AEROBIC ONLY Blood Culture adequate volume   Culture   Final    NO GROWTH < 12 HOURS Performed at [facility], [address], [place], [place] [address]    Report Status PENDING  Incomplete         Radiology Studies: Dg Chest 2 View  Result Date: 08/31/2017 CLINICAL DATA:  Shortness of breath, fever, and cough for the past 4 days. EXAM: CHEST  2 VIEW COMPARISON:  Chest x-ray dated Dec 31, 2016. FINDINGS: The cardiomediastinal silhouette is normal in size. Slightly coarsened interstitial markings, similar to prior study. Minimal left basilar atelectasis. No focal consolidation, pleural effusion, or pneumothorax. No acute osseous abnormality. IMPRESSION: No active cardiopulmonary disease. Electronically Signed   By: Titus Dubin M.D.   On: 08/31/2017 09:25   Mr Foot Right Wo Contrast  Result Date: 09/01/2017 CLINICAL DATA:  Red, painful swollen right foot and drainage. EXAM: MRI OF THE RIGHT FOREFOOT WITHOUT CONTRAST TECHNIQUE: Multiplanar, multisequence MR imaging of the right foot was performed. No intravenous contrast was administered. COMPARISON:  Radiographs 08/31/2016  FINDINGS: Bones/Joint/Cartilage Abnormal T1 and T2 signal intensity in the proximal and middle phalanges of the fourth toe consistent with osteomyelitis. The fourth metatarsal demonstrates normal signal intensity. There is a small joint effusion at the MTP joint but no obvious changes of septic arthritis. Diffuse abnormal subcutaneous soft tissue swelling/ edema/ fluid suggesting cellulitis. Skin blisters are noted along the dorsum of the toe. Fluid collections along the plantar aspect of the fourth digit could suggest soft tissue abscesses. Diffuse myofasciitis without definite findings for pyomyositis. IMPRESSION: 1. Diffuse cellulitis and myofasciitis. 2. Fluid collections along the dorsum of the forefoot along the fourth digit could be small abscesses. 3. Osteomyelitis involving the proximal and middle phalanges of the fourth toe Electronically Signed   By: Marijo Sanes M.D.   On: 09/01/2017 07:19   Dg Foot Complete Right  Result Date: 08/31/2017 CLINICAL DATA:  Diabetic foot ulcer. EXAM: RIGHT FOOT COMPLETE - 3+ VIEW COMPARISON:  04/14/2016 FINDINGS: No acute fracture or dislocations identified. Arthritic changes involving the first metatarsal phalanges joint and at the IP joint of the great toe noted. Again noted is an erosion of the tuft of the distal phalanx of the second toe which may represent an epidermoid inclusion cyst. 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 IMPRESSION: 1. No acute abnormality. 2. Possible epidermoid inclusion cyst involving the tuft of the distal phalanx of the second toe. Electronically Signed   By: Kerby Moors M.D.   On: 08/31/2017 10:24        Scheduled Meds: . amLODipine  10 mg Oral Daily  . aspirin EC  81 mg Oral Daily  . atorvastatin  20 mg Oral Daily  . carvedilol  12.5 mg Oral BID WC  . heparin  5,000 Units Subcutaneous Q8H  . insulin aspart  0-15 Units Subcutaneous TID WC  . insulin aspart  0-5 Units Subcutaneous QHS  . insulin glargine  10  Units Subcutaneous Daily  . lacosamide  200 mg Oral BID  . metroNIDAZOLE  500 mg Oral Q8H  . multivitamin with minerals  1 tablet Oral Daily  . protein supplement shake  11 oz Oral BID BM   Continuous Infusions: . sodium chloride Stopped (09/01/17 0620)  . ceFEPime (MAXIPIME) IV    . [START ON 09/02/2017] vancomycin  LOS: 1 day    Time spent: 35 minutes    Hosie Poisson, MD [facility] Pager [phone] If 7PM-7AM, please contact night-coverage [email or web address] Password [facility] 09/01/2017, 6:03 PM

## 2017-09-01 NOTE — Progress Notes (Signed)
Pt declined HH at present time.  

## 2017-09-01 NOTE — Progress Notes (Signed)
Chaplain providing support at pt request / RN referral.    Mark [name] expresses worry / anxiety around upcoming surgery. Is hopeful that he only loses toe.  As he is an amputee, he worries that losing foot would affect his mobility / independence.  He states he has not been able to rest due to worry and speaks with chaplain about his mind "spinning"     He relies on his faith in times of distress and spoke about God bringing him through several surgeries and losses of family members.  Shared prayers for peace and spoke about relying on God's provision in this place of uncertainty.   Chaplain delivered bible, as Mark [name] stated that he reads scripture to feel calm and restful at home.     Mark [name], MDiv

## 2017-09-01 NOTE — Progress Notes (Signed)
ABI completed: right indicates mild arterial disease. Mark [name], RDMS, RVT

## 2017-09-01 NOTE — Progress Notes (Signed)
Inpatient Diabetes Program Recommendations  AACE/ADA: New Consensus Statement on Inpatient Glycemic Control (2015)  Target Ranges:  Prepandial:   less than 140 mg/dL      Peak postprandial:   less than 180 mg/dL (1-2 hours)      Critically ill patients:  140 - 180 mg/dL   Lab Results  Component Value Date   GLUCAP 178 (H) 09/01/2017   HGBA1C 7.1 (H) 08/31/2017    Review of Glycemic Control  Diabetes history: DM2 Outpatient Diabetes medications: Lantus 10 units QD Current orders for Inpatient glycemic control: Lantus 10 units QD, Novolog 0-15 units tidwc and hs  7.1% HgbA1C indicates good glycemic control at home.  Inpatient Diabetes Program Recommendations:     Agree with orders. Will follow CBGs closely while inpatient.  Thank you. Lorenda Peck, RD, LDN, CDE Inpatient Diabetes Coordinator [phone]

## 2017-09-01 NOTE — Progress Notes (Signed)
Initial Nutrition Assessment  DOCUMENTATION CODES:   Obesity unspecified  INTERVENTION:    Provide Premier Protein BID, each supplement provides 160kcal and 30g protein.   Provide MVI daily  NUTRITION DIAGNOSIS:   Increased nutrient needs related to wound healing as evidenced by estimated needs.  GOAL:   Patient will meet greater than or equal to 90% of their needs  MONITOR:   PO intake, Supplement acceptance, Weight trends, Labs  REASON FOR ASSESSMENT:   Consult Wound healing  ASSESSMENT:   Pt with PMH significant for HTN, DM, CKD III, seizures, left BKA, and CHF. Presents this admission sepsis secondary to right diabetic foot ulcer.    Spoke with pt at bedside. Reports having stable appetite prior to admission. Typically consumes 2-3 meals per day that consist of baked meats, grain, and vegetables. Appetite remains stable this stay. Pt consumed 100% of breakfast this morning, eager to eat lunch . Pt does not consume supplementation at home. Will provide premier protein and MVI to aid with would healing.   Pt denies any recent wt loss. Weight noted to be stable per records. Nutrition-Focused physical exam completed.  Medications reviewed and include: SSI, lantus, IV abx, NS @ 75 ml/hr Labs reviewed: BUN 57 (H) Creatinine 3.80 (H)   NUTRITION - FOCUSED PHYSICAL EXAM:    Most Recent Value  Orbital Region  No depletion  Upper Arm Region  No depletion  Thoracic and Lumbar Region  Unable to assess  Buccal Region  No depletion  Temple Region  Mild depletion  Clavicle Bone Region  Mild depletion  Clavicle and Acromion Bone Region  No depletion  Scapular Bone Region  Unable to assess  Dorsal Hand  No depletion  Patellar Region  No depletion  Anterior Thigh Region  No depletion  Posterior Calf Region  No depletion  Edema (RD Assessment)  Mild  Hair  Reviewed  Eyes  Reviewed  Mouth  Reviewed  Skin  Reviewed  Nails  Reviewed      Diet Order:  Diet heart  healthy/carb modified Room service appropriate? Yes; Fluid consistency: Thin  EDUCATION NEEDS:   Education needs have been addressed  Skin:  Skin Assessment: Skin Integrity Issues: Skin Integrity Issues:: Diabetic Ulcer Diabetic Ulcer: right foot   Last BM:  08/31/16  Height:   Ht Readings from Last 1 Encounters:  08/31/17 6\' 2"  (1.88 m)    Weight:   Wt Readings from Last 1 Encounters:  08/31/17 235 lb (106.6 kg)    Ideal Body Weight:  81.4 kg(adjusted BKA)  BMI:  Body mass index is 30.17 kg/m.  Estimated Nutritional Needs:   Kcal:  2200-2400 kcal/day  Protein:  110-120 g/day  Fluid:  >2.2 L/day    Mariana Single RD, LDN Clinical Nutrition Pager # - [phone]
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
## 2017-09-01 NOTE — Progress Notes (Signed)
LCSW consulted for access to meds.   RNCM can assist with access to meds when they set up home health.   LCSW signing off. Please submit new consult if CSW needs arise.   Carolin Coy [place]

## 2017-09-01 NOTE — Consult Note (Signed)
[facility] Nurse wound consult note Reason for Consult: Necrotic right foot, 4th digit.  Patient is s/p visit to podiatry approximately 10 days ago for debridement of a callous in this location.  Patient is s/p BKA on the left extremity. Wound type: Traumatic, neuropathic Pressure Injury POA: N/A Measurement:entire digit Wound bed: tissue is yellow, edematous, forefoot is warm Drainage (amount, consistency, odor) small amount of serous to light yellow exudate. Periwound:erythematous, edematous Dressing procedure/placement/frequency: Recommend orthopedics for determination of digit salvageability. Dr. Lorin Mercy will see later today and if needed, can perform surgery on Friday.  In the meantime, I will not provide topical wound care orders.  I have asked Nursing to outline the area of erythema, induration and warmth on the forefoot.  Patient is on systemic antibiotics and has just returned from MRI. Orthopedic recommendations and orders supercede [facility] Nursing input.  [facility] nursing team will not follow, but will remain available to this patient, the nursing and medical teams.  Please re-consult if needed. Thanks, Maudie Flakes, MSN, RN, [place], Ar[facility]  Pager# [phone]
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
## 2017-09-02 ENCOUNTER — Other Ambulatory Visit: Payer: Self-pay | Admitting: Internal Medicine

## 2017-09-02 ENCOUNTER — Inpatient Hospital Stay (HOSPITAL_COMMUNITY): Payer: Medicare Other | Admitting: Certified Registered Nurse Anesthetist

## 2017-09-02 ENCOUNTER — Encounter (HOSPITAL_COMMUNITY)
Admission: EM | Disposition: A | Discharge status: Skilled Nursing Facility | Payer: Self-pay | Source: Home / Self Care | Attending: Family Medicine

## 2017-09-02 DIAGNOSIS — I5042 Chronic combined systolic (congestive) and diastolic (congestive) heart failure: Secondary | ICD-10-CM

## 2017-09-02 DIAGNOSIS — E11621 Type 2 diabetes mellitus with foot ulcer: Secondary | ICD-10-CM | POA: Diagnosis not present

## 2017-09-02 DIAGNOSIS — L97519 Non-pressure chronic ulcer of other part of right foot with unspecified severity: Secondary | ICD-10-CM | POA: Diagnosis not present

## 2017-09-02 DIAGNOSIS — I5032 Chronic diastolic (congestive) heart failure: Secondary | ICD-10-CM | POA: Diagnosis not present

## 2017-09-02 DIAGNOSIS — I110 Hypertensive heart disease with heart failure: Secondary | ICD-10-CM | POA: Diagnosis not present

## 2017-09-02 DIAGNOSIS — E1142 Type 2 diabetes mellitus with diabetic polyneuropathy: Secondary | ICD-10-CM

## 2017-09-02 DIAGNOSIS — M86171 Other acute osteomyelitis, right ankle and foot: Secondary | ICD-10-CM

## 2017-09-02 DIAGNOSIS — I10 Essential (primary) hypertension: Secondary | ICD-10-CM

## 2017-09-02 HISTORY — PX: AMPUTATION: SHX166

## 2017-09-02 LAB — CBC WITH DIFFERENTIAL/PLATELET
Basophils Absolute: 0 10*3/uL (ref 0.0–0.1)
Basophils Relative: 0 %
Eosinophils Absolute: 0 10*3/uL (ref 0.0–0.7)
Eosinophils Relative: 0 %
HCT: 27.1 % — ABNORMAL LOW (ref 39.0–52.0)
Hemoglobin: 8.8 g/dL — ABNORMAL LOW (ref 13.0–17.0)
Lymphocytes Relative: 6 %
Lymphs Abs: 2 10*3/uL (ref 0.7–4.0)
MCH: 29 pg (ref 26.0–34.0)
MCHC: 32.5 g/dL (ref 30.0–36.0)
MCV: 89.4 fL (ref 78.0–100.0)
Monocytes Absolute: 3 10*3/uL — ABNORMAL HIGH (ref 0.1–1.0)
Monocytes Relative: 9 %
Neutro Abs: 28.2 10*3/uL — ABNORMAL HIGH (ref 1.7–7.7)
Neutrophils Relative %: 85 %
Platelets: 412 10*3/uL — ABNORMAL HIGH (ref 150–400)
RBC: 3.03 MIL/uL — ABNORMAL LOW (ref 4.22–5.81)
RDW: 13.5 % (ref 11.5–15.5)
WBC: 33.2 10*3/uL — ABNORMAL HIGH (ref 4.0–10.5)
nRBC: 1 /100 WBC — ABNORMAL HIGH

## 2017-09-02 LAB — GLUCOSE, CAPILLARY
Glucose-Capillary: 167 mg/dL — ABNORMAL HIGH (ref 65–99)
Glucose-Capillary: 205 mg/dL — ABNORMAL HIGH (ref 65–99)
Glucose-Capillary: 149 mg/dL — ABNORMAL HIGH (ref 65–99)
Glucose-Capillary: 153 mg/dL — ABNORMAL HIGH (ref 65–99)

## 2017-09-02 LAB — SURGICAL PCR SCREEN
MRSA, PCR: POSITIVE — AB
Staphylococcus aureus: POSITIVE — AB

## 2017-09-02 SURGERY — AMPUTATION, FOOT, RAY
Anesthesia: General | Site: Foot | Laterality: Right

## 2017-09-02 MED ORDER — LIDOCAINE 2% (20 MG/ML) 5 ML SYRINGE
INTRAMUSCULAR | Status: DC | PRN
  Administered 2017-09-02: 80 mg via INTRAVENOUS

## 2017-09-02 MED ORDER — PROPOFOL 10 MG/ML IV BOLUS
INTRAVENOUS | Status: DC | PRN
  Administered 2017-09-02: 150 mg via INTRAVENOUS

## 2017-09-02 MED ORDER — ONDANSETRON HCL 4 MG/2ML IJ SOLN
INTRAMUSCULAR | Status: AC
  Filled 2017-09-02: qty 4

## 2017-09-02 MED ORDER — FENTANYL CITRATE (PF) 100 MCG/2ML IJ SOLN
INTRAMUSCULAR | Status: AC
  Filled 2017-09-02: qty 2

## 2017-09-02 MED ORDER — LIDOCAINE 2% (20 MG/ML) 5 ML SYRINGE
INTRAMUSCULAR | Status: AC
  Filled 2017-09-02: qty 10

## 2017-09-02 MED ORDER — PROMETHAZINE HCL 25 MG/ML IJ SOLN
12.5000 mg | Freq: Four times a day (QID) | INTRAMUSCULAR | Status: DC | PRN
  Administered 2017-09-02: 12.5 mg via INTRAVENOUS
  Filled 2017-09-02: qty 1

## 2017-09-02 MED ORDER — OXYCODONE HCL 5 MG PO TABS: 5.0000 mg | ORAL_TABLET | Freq: Once | ORAL | Status: DC | PRN

## 2017-09-02 MED ORDER — LACTATED RINGERS IV SOLN
INTRAVENOUS | Status: DC
  Administered 2017-09-02: 18:00:00 via INTRAVENOUS

## 2017-09-02 MED ORDER — MUPIROCIN 2 % EX OINT
TOPICAL_OINTMENT | Freq: Two times a day (BID) | CUTANEOUS | Status: DC
  Administered 2017-09-02 – 2017-09-08 (×14): via NASAL
  Administered 2017-09-09: 1 via NASAL
  Administered 2017-09-09 – 2017-09-12 (×6): via NASAL
  Administered 2017-09-12: 1 via NASAL
  Administered 2017-09-13 (×2): via NASAL
  Administered 2017-09-14: 1 via NASAL
  Filled 2017-09-02 (×2): qty 22

## 2017-09-02 MED ORDER — EPHEDRINE 5 MG/ML INJ
INTRAVENOUS | Status: AC
  Filled 2017-09-02: qty 10

## 2017-09-02 MED ORDER — HYDROCODONE-ACETAMINOPHEN 5-325 MG PO TABS
1.0000 | ORAL_TABLET | Freq: Four times a day (QID) | ORAL | Status: DC | PRN
Start: 2017-09-02 — End: 2017-09-14
  Administered 2017-09-03 – 2017-09-09 (×5): 2 via ORAL
  Administered 2017-09-12: 1 via ORAL
  Filled 2017-09-02: qty 2
  Filled 2017-09-02: qty 1
  Filled 2017-09-02 (×6): qty 2

## 2017-09-02 MED ORDER — OXYCODONE HCL 5 MG/5ML PO SOLN: 5.0000 mg | Freq: Once | ORAL | Status: DC | PRN

## 2017-09-02 MED ORDER — CHLORHEXIDINE GLUCONATE 4 % EX LIQD
60.0000 mL | Freq: Once | CUTANEOUS | Status: AC
  Administered 2017-09-02: 4 via TOPICAL
  Filled 2017-09-02: qty 60

## 2017-09-02 MED ORDER — MORPHINE SULFATE (PF) 4 MG/ML IV SOLN
1.0000 mg | INTRAVENOUS | Status: DC | PRN
  Administered 2017-09-03 – 2017-09-10 (×8): 2 mg via INTRAVENOUS
  Filled 2017-09-02 (×7): qty 1

## 2017-09-02 MED ORDER — POVIDONE-IODINE 10 % EX SWAB
2.0000 "application " | Freq: Once | CUTANEOUS | Status: AC
  Administered 2017-09-02: 2 via TOPICAL

## 2017-09-02 MED ORDER — ONDANSETRON HCL 4 MG/2ML IJ SOLN
4.0000 mg | Freq: Four times a day (QID) | INTRAMUSCULAR | Status: AC | PRN
  Administered 2017-09-02: 4 mg via INTRAVENOUS

## 2017-09-02 MED ORDER — EPHEDRINE SULFATE-NACL 50-0.9 MG/10ML-% IV SOSY
PREFILLED_SYRINGE | INTRAVENOUS | Status: DC | PRN
  Administered 2017-09-02 (×2): 5 mg via INTRAVENOUS
  Administered 2017-09-02: 10 mg via INTRAVENOUS

## 2017-09-02 MED ORDER — PROPOFOL 10 MG/ML IV BOLUS
INTRAVENOUS | Status: AC
  Filled 2017-09-02: qty 20

## 2017-09-02 MED ORDER — FENTANYL CITRATE (PF) 100 MCG/2ML IJ SOLN
25.0000 ug | INTRAMUSCULAR | Status: DC | PRN
  Administered 2017-09-02: 25 ug via INTRAVENOUS

## 2017-09-02 MED ORDER — ONDANSETRON HCL 4 MG/2ML IJ SOLN
INTRAMUSCULAR | Status: AC
  Filled 2017-09-02: qty 2

## 2017-09-02 MED ORDER — FENTANYL CITRATE (PF) 100 MCG/2ML IJ SOLN
INTRAMUSCULAR | Status: DC | PRN
  Administered 2017-09-02: 50 ug via INTRAVENOUS

## 2017-09-02 SURGICAL SUPPLY — 42 items
APL SKNCLS STERI-STRIP NONHPOA (GAUZE/BANDAGES/DRESSINGS) ×2
BAG SPEC THK2 15X12 ZIP CLS (MISCELLANEOUS) ×1
BAG ZIPLOCK 12X15 (MISCELLANEOUS) ×2 IMPLANT
BANDAGE ACE 4X5 VEL STRL LF (GAUZE/BANDAGES/DRESSINGS) ×1 IMPLANT
BANDAGE ESMARK 6X9 LF (GAUZE/BANDAGES/DRESSINGS) ×1 IMPLANT
BENZOIN TINCTURE PRP APPL 2/3 (GAUZE/BANDAGES/DRESSINGS) ×2 IMPLANT
BLADE MIC 41X13 (BLADE) ×1 IMPLANT
BNDG CMPR 9X6 STRL LF SNTH (GAUZE/BANDAGES/DRESSINGS) ×1
BNDG COHESIVE 3X5 TAN STRL LF (GAUZE/BANDAGES/DRESSINGS) ×2 IMPLANT
BNDG COHESIVE 6X5 TAN STRL LF (GAUZE/BANDAGES/DRESSINGS) ×2 IMPLANT
BNDG ESMARK 6X9 LF (GAUZE/BANDAGES/DRESSINGS) ×2
BNDG GAUZE ELAST 4 BULKY (GAUZE/BANDAGES/DRESSINGS) ×1 IMPLANT
COVER SURGICAL LIGHT HANDLE (MISCELLANEOUS) ×2 IMPLANT
CUFF TOURN SGL QUICK 34 (TOURNIQUET CUFF)
CUFF TRNQT CYL 34X4X40X1 (TOURNIQUET CUFF) ×1 IMPLANT
DRAPE SHEET LG 3/4 BI-LAMINATE (DRAPES) ×2 IMPLANT
DRAPE SURG 17X11 SM STRL (DRAPES) ×2 IMPLANT
DRAPE U-SHAPE 47X51 STRL (DRAPES) ×3 IMPLANT
DRSG ADAPTIC 3X8 NADH LF (GAUZE/BANDAGES/DRESSINGS) ×2 IMPLANT
DRSG VAC ATS SM SENSATRAC (GAUZE/BANDAGES/DRESSINGS) ×1 IMPLANT
DURAPREP 26ML APPLICATOR (WOUND CARE) ×2 IMPLANT
ELECT REM PT RETURN 15FT ADLT (MISCELLANEOUS) ×2 IMPLANT
GAUZE SPONGE 4X4 12PLY STRL (GAUZE/BANDAGES/DRESSINGS) ×4 IMPLANT
GLOVE ORTHO TXT STRL SZ7.5 (GLOVE) ×2 IMPLANT
GOWN STRL REUS W/ TWL XL LVL3 (GOWN DISPOSABLE) ×1 IMPLANT
GOWN STRL REUS W/TWL LRG LVL3 (GOWN DISPOSABLE) ×1 IMPLANT
GOWN STRL REUS W/TWL XL LVL3 (GOWN DISPOSABLE) ×2
KIT BASIN OR (CUSTOM PROCEDURE TRAY) ×1 IMPLANT
NS IRRIG 1000ML POUR BTL (IV SOLUTION) ×2 IMPLANT
PACK TOTAL JOINT (CUSTOM PROCEDURE TRAY) ×2 IMPLANT
PAD CAST 4YDX4 CTTN HI CHSV (CAST SUPPLIES) ×1 IMPLANT
PADDING CAST COTTON 4X4 STRL (CAST SUPPLIES) ×2
POSITIONER SURGICAL ARM (MISCELLANEOUS) ×4 IMPLANT
STOCKINETTE 8 INCH (MISCELLANEOUS) ×2 IMPLANT
SUCTION FRAZIER HANDLE 10FR (MISCELLANEOUS) ×1
SUCTION TUBE FRAZIER 10FR DISP (MISCELLANEOUS) ×1 IMPLANT
SUT ETHILON 0 48 LOOP BLK (SUTURE) ×2 IMPLANT
SUT ETHILON 2 0 PSLX (SUTURE) ×4 IMPLANT
SWAB COLLECTION DEVICE MRSA (MISCELLANEOUS) ×1 IMPLANT
SWAB CULTURE ESWAB REG 1ML (MISCELLANEOUS) ×1 IMPLANT
TOWEL OR 17X26 10 PK STRL BLUE (TOWEL DISPOSABLE) ×2 IMPLANT
WATER STERILE IRR 1000ML POUR (IV SOLUTION) ×1 IMPLANT

## 2017-09-02 NOTE — Anesthesia Preprocedure Evaluation (Signed)
Anesthesia Evaluation  Patient identified by MRN, date of birth, ID band Patient awake    Reviewed: Allergy & Precautions, H&P , NPO status , Patient's Chart, lab work & pertinent test results  Airway Mallampati: II   Neck ROM: full    Dental   Pulmonary former smoker,    breath sounds clear to auscultation       Cardiovascular hypertension, + Peripheral Vascular Disease and +CHF   Rhythm:regular Rate:Normal     Neuro/Psych Seizures -,   Neuromuscular disease    GI/Hepatic GERD  ,(+)     substance abuse  cocaine use,   Endo/Other  diabetes, Type 2  Renal/GU Renal InsufficiencyRenal disease     Musculoskeletal   Abdominal   Peds  Hematology  (+) anemia ,   Anesthesia Other Findings   Reproductive/Obstetrics                             Anesthesia Physical Anesthesia Plan  ASA: III  Anesthesia Plan: General   Post-op Pain Management:    Induction: Intravenous  PONV Risk Score and Plan: 2 and Ondansetron, Midazolam and Treatment may vary due to age or medical condition  Airway Management Planned: LMA  Additional Equipment:   Intra-op Plan:   Post-operative Plan:   Informed Consent: I have reviewed the patients History and Physical, chart, labs and discussed the procedure including the risks, benefits and alternatives for the proposed anesthesia with the patient or authorized representative who has indicated his/her understanding and acceptance.     Plan Discussed with: CRNA, Anesthesiologist and Surgeon  Anesthesia Plan Comments:         Anesthesia Quick Evaluation

## 2017-09-02 NOTE — Interval H&P Note (Signed)
History and Physical Interval Note:  09/02/2017 5:47 PM  Mark [name]  has presented today for surgery, with the diagnosis of RIGHT DIABETIC INFECTED FOOT/4TH TOE POSSIBLE 4TH AND 5TH RAY AMPUTATION AVAILABLE 1730, WILL CALL IF AVAILABLE EARLIER  The various methods of treatment have been discussed with the patient and family. After consideration of risks, benefits and other options for treatment, the patient has consented to  Procedure(s): AMPUTATION RAY (Right) as a surgical intervention .  The patient's history has been reviewed, patient examined, no change in status, stable for surgery.  I have reviewed the patient's chart and labs.  Questions were answered to the patient's satisfaction.     [name]

## 2017-09-02 NOTE — Transfer of Care (Signed)
Immediate Anesthesia Transfer of Care Note  Patient: Mark [name]  Procedure(s) Performed: AMPUTATION RAY (Right Foot)  Patient Location: PACU  Anesthesia Type:General  Level of Consciousness: awake, alert  and oriented  Airway & Oxygen Therapy: Patient Spontanous Breathing and Patient connected to face mask oxygen  Post-op Assessment: Report given to RN and Post -op Vital signs reviewed and stable  Post vital signs: Reviewed and stable  Last Vitals:  Vitals:   09/02/17 0910 09/02/17 1437  BP: 127/68 (!) 146/75  Pulse: 74 77  Resp: 18 18  Temp: 37.3 C 37.3 C  SpO2: 98% 97%    Last Pain:  Vitals:   09/02/17 1437  TempSrc: Oral  PainSc:       Patients Stated Pain Goal: 3 (94/47/39 [id number])  Complications: No apparent anesthesia complications

## 2017-09-02 NOTE — Op Note (Signed)
Preop diagnosis: Diabetic right foot infection with osteomyelitis fourth toe and questionable dorsal foot abscesses.  Postoperative diagnosis: Right fourth toe osteomyelitis, dorsal foot abscesses.  Fourth metatarsal phalangeal joint pyarthrosis.  Procedure: Fourth and fifth ray amputation right foot, VAC application  Surgeon: Rodell Perna MD  Anesthesia: General, LMA  Tourniquet: Esmarch ankle tourniquet.  Procedure: Vancomycin was started is scheduled and patient's been on antibiotics for greater than 24 hours.  Prepping with DuraPrep timeout procedure usual extremity sheets and drapes impervious stockinette.  Wreck and handle incision was made extending dorsally to the metatarsal phalangeal joint for planned fourth toe amputation.  Extensive discussion been held with the patient that a fourth and fifth ray amputation may be needed.  Incision was made and over the region of the metatarsal neck there was purulence coming proximally and laterally in the subcutaneous tissue with tendon extensor necrosis.  Fourth metatarsal phalangeal joint had obvious pus inside the joint and incision was then extended to include a racquet handle incision catching the fifth toe as well and the fourth and fifth metatarsals were exposed proximally an oscillating saw was used for oblique cut across the fourth and fifth and fourth and fifth ray amputations were performed.  Plantar surface of the foot was intact and with Esmarch removed there was pulsatile bleeding at the skin edges.  Bone proximally looked good.  Copious irrigation with saline was performed.  Cultures were obtained aerobic and anaerobic from the amputated toe.  After irrigation a small wound VAC was placed after the proximal portion was pulled side to side with 3 separate 2-0 nylon mattress sutures.  Small VAC was placed set at 100 and there was a low leak seal.  Patient tolerated the procedure well good capillary refill at the skin edges and was transferred to  the recovery room.

## 2017-09-02 NOTE — Anesthesia Procedure Notes (Signed)
Procedure Name: LMA Insertion Date/Time: 09/02/2017 6:03 PM Performed by: Montel Clock, CRNA Pre-anesthesia Checklist: Patient identified, Emergency Drugs available, Suction available and Patient being monitored Patient Re-evaluated:Patient Re-evaluated prior to induction Oxygen Delivery Method: Circle system utilized Preoxygenation: Pre-oxygenation with 100% oxygen Induction Type: IV induction LMA: LMA flexible inserted LMA Size: 5.0 Number of attempts: 1 Tube secured with: Tape Dental Injury: Teeth and Oropharynx as per pre-operative assessment

## 2017-09-02 NOTE — Progress Notes (Signed)
Continued support around anxiety related to upcoming surgery.  Shared prayer and pastoral presence with Mark [name]

## 2017-09-02 NOTE — Progress Notes (Signed)
PROGRESS NOTE    Mark [name]  UTM:[id number] DOB: 1951-08-05 DOA: 08/31/2017 PCP: [name], MD   Brief Narrative:   Mark [name] is a 67 year old male with a past medical history significant for hypertension, diabetes, chronic kidney disease is stage III, seizures, left BKA and chronic diastolic heart failure; who presented to the emergency department secondary to right foot redness, swelling and drainage.  Patient also reported subjective fever/chills and no feeling well.  Patient was seen by his podiatrist about 10-12 days ago and had remove a corn on his right fourth toe. Patient was using neosporin and wearing band-aids.  On the day of admission patient noticed increase inflammation and drainage out of his foot and decided to seek medical attention.  MRI of the right foot shows 1. Diffuse cellulitis and myofasciitis. 2. Fluid collections along the dorsum of the forefoot along the fourth digit could be small abscesses. 3. Osteomyelitis involving the proximal and middle phalanges of the fourth toe   Assessment & Plan:   Principal Problem:   Diabetic foot ulcer ([place]) Active Problems:   S/P BKA (below knee amputation) (HCC)   Essential hypertension   Chronic diastolic CHF (congestive heart failure) (HCC)   Diabetes mellitus, type 2 (HCC)   GERD (gastroesophageal reflux disease)   CKD (chronic kidney disease) stage 3, GFR 30-59 ml/min (HCC)   Seizure disorder (HCC)   Diabetic right foot ulcer/osteomyelitis of the fourth toe/absence on the dorsum of the right forefoot Orthopedics consulted and plan for 4 th toe amputation today by Dr Lorin Mercy. NPO status.  On vancomycin and cefepime. Pain control.   Acute on chronic kidney disease stage III Probably secondary to ATN.  Get urine electrolytes, US renal for further evaluation.    Chronic diastolic heart failure Does not appear to be decompensated.  Type 2 diabetes mellitus  CBG (last 3)  Recent Labs   09/01/17 2031 09/02/17 0730 09/02/17 1138  GLUCAP 194* 167* 205*   Suboptimal currently on 10 units of Lantus resume with moderate sliding scale insulin.   Hypertension well-controlled   Leukocytosis secondary to Diabetic foot ulcer infection.    DVT prophylaxis: Subcu heparin  code Status: Full code Family Communication: None at bedside, discussed the plan with the patient. Disposition Plan: plan for surgery today.    Consultants:   Orthopedics.    Procedures: MRI of the foot.   Antimicrobials:vancomycin and cefepime.   Subjective: Pain well controlled.  No chest pain or sob.   Objective: Vitals:   09/02/17 0546 09/02/17 0813 09/02/17 0910 09/02/17 1437  BP: 133/81 136/72 127/68 (!) 146/75  Pulse: 75  74 77  Resp: 18  18 18   Temp: 99.2 F (37.3 C)  99.2 F (37.3 C) 99.2 F (37.3 C)  TempSrc: Oral  Oral Oral  SpO2: 98%  98% 97%  Weight: 104.4 kg (230 lb 2.6 oz)     Height:        Intake/Output Summary (Last 24 hours) at 09/02/2017 1722 Last data filed at 09/02/2017 1200 Gross per 24 hour  Intake 1551.25 ml  Output 550 ml  Net 1001.25 ml   Filed Weights   08/31/17 1725 09/02/17 0546  Weight: 106.6 kg (235 lb) 104.4 kg (230 lb 2.6 oz)    Examination:  General exam: Appears calm and comfortable not in any kind of distress.  Respiratory system: Clear to auscultation. Respiratory effort normal.no wheezing or rhonchi.  Cardiovascular system: S1 & S2 heard, RRR. No JVD, . Gastrointestinal system:  Abdomen is nondistended, soft and nontender. No organomegaly or masses felt. Normal bowel sounds heard. Central nervous system: Alert and oriented. No focal neurological deficits. Extremities: right foot is swollen, tender and erythematous.  Skin: 4 th toe abscess.  Psychiatry: Judgement and insight appear normal. Mood & affect appropriate.     Data Reviewed: I have personally reviewed following labs and imaging studies  CBC: Recent Labs  Lab 08/31/17 1050  08/31/17 1730 09/01/17 0545 09/02/17 1054  WBC 33.1* 33.8* 33.4* 33.2*  NEUTROABS 28.1*  --   --  28.2*  HGB 9.5* 9.3* 8.9* 8.8*  HCT 28.3* 28.6* 27.1* 27.1*  MCV 89.6 91.1 90.3 89.4  PLT 354 373 362 790*   Basic Metabolic Panel: Recent Labs  Lab 08/31/17 1050 08/31/17 1730 09/01/17 0545  NA 136  --  135  K 5.0  --  4.2  CL 110  --  111  CO2 17*  --  17*  GLUCOSE 169*  --  158*  BUN 57*  --  57*  CREATININE 3.69* 3.50* 3.80*  CALCIUM 8.8*  --  8.4*  MG  --  1.8  --   PHOS  --  3.3  --    GFR: Estimated Creatinine Clearance: 25 mL/min (A) (by C-G formula based on SCr of 3.8 mg/dL (H)). Liver Function Tests: Recent Labs  Lab 08/31/17 1050  AST 19  ALT 14*  ALKPHOS 106  BILITOT 0.9  PROT 7.8  ALBUMIN 3.0*   No results for input(s): LIPASE, AMYLASE in the last 168 hours. No results for input(s): AMMONIA in the last 168 hours. Coagulation Profile: No results for input(s): INR, PROTIME in the last 168 hours. Cardiac Enzymes: No results for input(s): CKTOTAL, CKMB, CKMBINDEX, TROPONINI in the last 168 hours. BNP (last 3 results) No results for input(s): PROBNP in the last 8760 hours. HbA1C: Recent Labs    08/31/17 1730  HGBA1C 7.1*   CBG: Recent Labs  Lab 09/01/17 1144 09/01/17 1713 09/01/17 2031 09/02/17 0730 09/02/17 1138  GLUCAP 169* 208* 194* 167* 205*   Lipid Profile: No results for input(s): CHOL, HDL, LDLCALC, TRIG, CHOLHDL, LDLDIRECT in the last 72 hours. Thyroid Function Tests: No results for input(s): TSH, T4TOTAL, FREET4, T3FREE, THYROIDAB in the last 72 hours. Anemia Panel: No results for input(s): VITAMINB12, FOLATE, FERRITIN, TIBC, IRON, RETICCTPCT in the last 72 hours. Sepsis Labs: Recent Labs  Lab 08/31/17 1057 08/31/17 1325  LATICACIDVEN 0.79 0.80    Recent Results (from the past 240 hour(s))  Culture, blood (routine x 2)     Status: None (Preliminary result)   Collection Time: 08/31/17  1:10 PM  Result Value Ref Range Status    Specimen Description BLOOD RIGHT ANTECUBITAL  Final   Special Requests   Final    BOTTLES DRAWN AEROBIC AND ANAEROBIC Blood Culture adequate volume   Culture   Final    NO GROWTH 2 DAYS Performed at [facility], [place] [address], [place], [place] [address]    Report Status PENDING  Incomplete  Culture, blood (routine x 2)     Status: None (Preliminary result)   Collection Time: 08/31/17  1:15 PM  Result Value Ref Range Status   Specimen Description BLOOD LEFT HAND  Final   Special Requests IN PEDIATRIC BOTTLE Blood Culture adequate volume  Final   Culture   Final    NO GROWTH 2 DAYS Performed at [facility], [place] [address], [place],  [address]  Report Status PENDING  Incomplete  Blood Cultures x 2 sites     Status: None (Preliminary result)   Collection Time: 08/31/17  5:30 PM  Result Value Ref Range Status   Specimen Description BLOOD LEFT ARM  Final   Special Requests   Final    BOTTLES DRAWN AEROBIC ONLY Blood Culture adequate volume   Culture   Final    NO GROWTH 2 DAYS Performed at [facility], [address], [place], [place] [address]    Report Status PENDING  Incomplete  Surgical PCR screen     Status: Abnormal   Collection Time: 09/01/17  9:31 PM  Result Value Ref Range Status   MRSA, PCR POSITIVE (A) NEGATIVE Final    Comment: RESULT CALLED TO, READ BACK BY AND VERIFIED WITH: S CORE RN [phone] 01/044/19 A NAVARRO    Staphylococcus aureus POSITIVE (A) NEGATIVE Final    Comment: (NOTE) The Xpert SA Assay (FDA approved for NASAL specimens in patients 16 years of age and older), is one component of a comprehensive surveillance program. It is not intended to diagnose infection nor to guide or monitor treatment.          Radiology Studies: Mr Foot Right Wo Contrast  Result Date: 09/01/2017 CLINICAL DATA:  Red, painful swollen right foot and drainage. EXAM: MRI OF THE RIGHT FOREFOOT WITHOUT CONTRAST TECHNIQUE: Multiplanar,  multisequence MR imaging of the right foot was performed. No intravenous contrast was administered. COMPARISON:  Radiographs 08/31/2016 FINDINGS: Bones/Joint/Cartilage Abnormal T1 and T2 signal intensity in the proximal and middle phalanges of the fourth toe consistent with osteomyelitis. The fourth metatarsal demonstrates normal signal intensity. There is a small joint effusion at the MTP joint but no obvious changes of septic arthritis. Diffuse abnormal subcutaneous soft tissue swelling/ edema/ fluid suggesting cellulitis. Skin blisters are noted along the dorsum of the toe. Fluid collections along the plantar aspect of the fourth digit could suggest soft tissue abscesses. Diffuse myofasciitis without definite findings for pyomyositis. IMPRESSION: 1. Diffuse cellulitis and myofasciitis. 2. Fluid collections along the dorsum of the forefoot along the fourth digit could be small abscesses. 3. Osteomyelitis involving the proximal and middle phalanges of the fourth toe Electronically Signed   By: Marijo Sanes M.D.   On: 09/01/2017 07:19        Scheduled Meds: . amLODipine  10 mg Oral Daily  . aspirin EC  81 mg Oral Daily  . atorvastatin  20 mg Oral Daily  . carvedilol  12.5 mg Oral BID WC  . insulin aspart  0-15 Units Subcutaneous TID WC  . insulin aspart  0-5 Units Subcutaneous QHS  . insulin glargine  10 Units Subcutaneous Daily  . lacosamide  200 mg Oral BID  . metroNIDAZOLE  500 mg Oral Q8H  . multivitamin with minerals  1 tablet Oral Daily  . mupirocin ointment   Nasal BID  . povidone-iodine  2 application Topical Once  . protein supplement shake  11 oz Oral BID BM   Continuous Infusions: . sodium chloride 75 mL/hr at 09/01/17 2007  . ceFEPime (MAXIPIME) IV 1 g (09/01/17 2008)  . lactated ringers    . vancomycin       LOS: 2 days    Time spent: 35 minutes    Hosie Poisson, MD [facility] Pager [phone] If 7PM-7AM, please contact  night-coverage [email or web address] Password [email or web address] 09/02/2017, 5:22 PM

## 2017-09-02 NOTE — Progress Notes (Signed)
Pharmacy Antibiotic Note  Mark [name] is a 67 y.o. male admitted on 08/31/2017 with possible diabetic foot infection.  Pharmacy has been consulted for vancomycin and cefepime dosing; MD dosing flagyl.  Day #3 antibiotics  Tm 100.5  Renal: CKD - SCr = 3.8 1/3  CBC: remains elevated, unchanged  ABI - mild arterial dz RLE  Ortho to do 4th toe amputation, possible 5th ray amputation today  Plan:  Continue vancomycin 1250mg  IV q48h   Dose adjusted for L BKA  Check peak and trough at steady state to calculate AUC (goal 400-500)  Cefepime 1g IV q24h   Flagyl 500mg  PO q8h per MD  Height: 6\' 2"  (188 cm) Weight: 230 lb 2.6 oz (104.4 kg) IBW/kg (Calculated) : 82.2  Temp (24hrs), Avg:99.7 F (37.6 C), Min:99.1 F (37.3 C), Max:100.5 F (38.1 C)  Recent Labs  Lab 08/31/17 1050 08/31/17 1057 08/31/17 1325 08/31/17 1730 09/01/17 0545  WBC 33.1*  --   --  33.8* 33.4*  CREATININE 3.69*  --   --  3.50* 3.80*  LATICACIDVEN  --  0.79 0.80  --   --     Estimated Creatinine Clearance: 25 mL/min (A) (by C-G formula based on SCr of 3.8 mg/dL (H)).    No Known Allergies  Antimicrobials this admission:  1/2 Vancomycin >> 1/2 Cefepime >> 1/2 Flagyl >>  Dose adjustments this admission:    Microbiology results:  1/2 BCx: NGTD 1/3 MRSA PCR: Positive  Thank you for allowing pharmacy to be a part of this patient's care.  Doreene Eland, PharmD, BCPS.   Pager: [phone] 09/02/2017 8:01 AM

## 2017-09-03 ENCOUNTER — Inpatient Hospital Stay (HOSPITAL_COMMUNITY): Payer: Medicare Other

## 2017-09-03 LAB — BASIC METABOLIC PANEL
Anion gap: 4 — ABNORMAL LOW (ref 5–15)
BUN: 59 mg/dL — ABNORMAL HIGH (ref 6–20)
CO2: 19 mmol/L — ABNORMAL LOW (ref 22–32)
Calcium: 8 mg/dL — ABNORMAL LOW (ref 8.9–10.3)
Chloride: 114 mmol/L — ABNORMAL HIGH (ref 101–111)
Creatinine, Ser: 3.58 mg/dL — ABNORMAL HIGH (ref 0.61–1.24)
GFR calc Af Amer: 19 mL/min — ABNORMAL LOW (ref >60)
GFR calc non Af Amer: 16 mL/min — ABNORMAL LOW (ref >60)
Glucose, Bld: 186 mg/dL — ABNORMAL HIGH (ref 65–99)
Potassium: 4.6 mmol/L (ref 3.5–5.1)
Sodium: 137 mmol/L (ref 135–145)

## 2017-09-03 LAB — GLUCOSE, CAPILLARY
Glucose-Capillary: 162 mg/dL — ABNORMAL HIGH (ref 65–99)
Glucose-Capillary: 209 mg/dL — ABNORMAL HIGH (ref 65–99)
Glucose-Capillary: 232 mg/dL — ABNORMAL HIGH (ref 65–99)
Glucose-Capillary: 249 mg/dL — ABNORMAL HIGH (ref 65–99)

## 2017-09-03 LAB — CBC
HCT: 26.7 % — ABNORMAL LOW (ref 39.0–52.0)
Hemoglobin: 8.7 g/dL — ABNORMAL LOW (ref 13.0–17.0)
MCH: 29.1 pg (ref 26.0–34.0)
MCHC: 32.6 g/dL (ref 30.0–36.0)
MCV: 89.3 fL (ref 78.0–100.0)
Platelets: 432 10*3/uL — ABNORMAL HIGH (ref 150–400)
RBC: 2.99 MIL/uL — ABNORMAL LOW (ref 4.22–5.81)
RDW: 13.5 % (ref 11.5–15.5)
WBC: 29.9 10*3/uL — ABNORMAL HIGH (ref 4.0–10.5)

## 2017-09-03 LAB — SODIUM, URINE, RANDOM: Sodium, Ur: 62 mmol/L

## 2017-09-03 LAB — CREATININE, URINE, RANDOM: Creatinine, Urine: 83.59 mg/dL

## 2017-09-03 MED ORDER — INSULIN GLARGINE 100 UNIT/ML ~~LOC~~ SOLN
15.0000 [IU] | Freq: Every day | SUBCUTANEOUS | Status: DC
  Administered 2017-09-04 – 2017-09-14 (×11): 15 [IU] via SUBCUTANEOUS
  Filled 2017-09-03 (×12): qty 0.15

## 2017-09-03 NOTE — Progress Notes (Signed)
Patient ID: [name], male   DOB: 1951-05-30, 67 y.o.   MRN: [id number] Post op day 1, 4th and 5th ray amputation, VAC working well, plan for d/c to SNF, orders written for social worker, patient also has BKA on opposite side

## 2017-09-03 NOTE — Progress Notes (Signed)
Mark [name]  GYI:[id number] DOB: 1951/01/31 DOA: 08/31/2017 PCP: [name], MD   Brief Narrative:   Mark [name] is a 67 year old male with a past medical history significant for hypertension, diabetes, chronic kidney disease is stage III, seizures, left BKA and chronic diastolic heart failure; who presented to the emergency department secondary to right foot redness, swelling and drainage.  Patient also reported subjective fever/chills and no feeling well.  Patient was seen by his podiatrist about 10-12 days ago and had remove a corn on his right fourth toe. Patient was using neosporin and wearing band-aids.  On the day of admission patient noticed increase inflammation and drainage out of his foot and decided to seek medical attention.  MRI of the right foot shows 1. Diffuse cellulitis and myofasciitis. 2. Fluid collections along the dorsum of the forefoot along the fourth digit could be small abscesses. 3. Osteomyelitis involving the proximal and middle phalanges of the fourth toe   Assessment & Plan:   Principal Problem:   Diabetic foot ulcer ([place]) Active Problems:   S/P BKA (below knee amputation) (HCC)   Essential hypertension   Chronic diastolic CHF (congestive heart failure) (HCC)   Diabetes mellitus, type 2 (HCC)   GERD (gastroesophageal reflux disease)   CKD (chronic kidney disease) stage 3, GFR 30-59 ml/min (HCC)   Seizure disorder (HCC)   Diabetic right foot ulcer/osteomyelitis of the fourth toe/absence on the dorsum of the right forefoot Orthopedics consulted andhe underwent  Fourth and fifth ray amputation right foot, VAC applied.  Plan for snf on discharge.  currently on day 3 of  vancomycin and cefepime. Pain control.   Acute on chronic kidney disease stage III Probably secondary to ATN.  [place] doe not show any hydronephrosis.  Improving The current medical regimen is effective;  continue present plan and medications.      Chronic diastolic heart failure Compensated.   Type 2 diabetes mellitus  CBG (last 3)  Recent Labs    09/03/17 0800 09/03/17 1128 09/03/17 1624  GLUCAP 162* 209* 232*   Suboptimal currently on 10 units of Lantus resume with moderate sliding scale insulin. Increase lantus to 15 units daily.    Hypertension well-controlled   Leukocytosis secondary to Diabetic foot ulcer infection. Repeat cbc in am.    DVT prophylaxis: Subcu heparin  code Status: Full code Family Communication: None at bedside, discussed the plan with the patient. Disposition Plan: plan for SNF on Monday.    Consultants:   Orthopedics.    Procedures: MRI of the foot.   Antimicrobials:vancomycin and cefepime.   Subjective: Pain not well controlled.   Objective: Vitals:   09/02/17 2226 09/02/17 2314 09/03/17 0554 09/03/17 1337  BP: 135/89 139/78 (!) 141/73 136/76  Pulse: 82 73 75 70  Resp: 18 20 19 19   Temp: 98.9 F (37.2 C) 98.9 F (37.2 C) 99.4 F (37.4 C) 98.7 F (37.1 C)  TempSrc: Oral Oral Oral Oral  SpO2: 99% 99% 97% 94%  Weight:   106.1 kg (234 lb)   Height:        Intake/Output Summary (Last 24 hours) at 09/03/2017 1721 Last data filed at 09/03/2017 1708 Gross per 24 hour  Intake 2802.5 ml  Output 780 ml  Net 2022.5 ml   Filed Weights   08/31/17 1725 09/02/17 0546 09/03/17 0554  Weight: 106.6 kg (235 lb) 104.4 kg (230 lb 2.6 oz) 106.1 kg (234 lb)    Examination:  General exam:  Appears calm and comfortable not in any kind of distress.  Respiratory system: air entry fair. No wheezing or rhonchi.  Cardiovascular system: S1 & S2 heard, RRR. No JVD, . Gastrointestinal system: Abdomen is soft NT ND BS+ Central nervous system: Alert and oriented. No focal neurological deficits. Extremities: right foot bandaged with wound vac on.  Skin: 4 th toe abscess.  Psychiatry: Judgement and insight appear normal. Mood & affect appropriate.     Data Reviewed: I have personally  reviewed following labs and imaging studies  CBC: Recent Labs  Lab 08/31/17 1050 08/31/17 1730 09/01/17 0545 09/02/17 1054 09/03/17 0630  WBC 33.1* 33.8* 33.4* 33.2* 29.9*  NEUTROABS 28.1*  --   --  28.2*  --   HGB 9.5* 9.3* 8.9* 8.8* 8.7*  HCT 28.3* 28.6* 27.1* 27.1* 26.7*  MCV 89.6 91.1 90.3 89.4 89.3  PLT 354 373 362 412* 468*   Basic Metabolic Panel: Recent Labs  Lab 08/31/17 1050 08/31/17 1730 09/01/17 0545 09/03/17 0630  NA 136  --  135 137  K 5.0  --  4.2 4.6  CL 110  --  111 114*  CO2 17*  --  17* 19*  GLUCOSE 169*  --  158* 186*  BUN 57*  --  57* 59*  CREATININE 3.69* 3.50* 3.80* 3.58*  CALCIUM 8.8*  --  8.4* 8.0*  MG  --  1.8  --   --   PHOS  --  3.3  --   --    GFR: Estimated Creatinine Clearance: 26.7 mL/min (A) (by C-G formula based on SCr of 3.58 mg/dL (H)). Liver Function Tests: Recent Labs  Lab 08/31/17 1050  AST 19  ALT 14*  ALKPHOS 106  BILITOT 0.9  PROT 7.8  ALBUMIN 3.0*   No results for input(s): LIPASE, AMYLASE in the last 168 hours. No results for input(s): AMMONIA in the last 168 hours. Coagulation Profile: No results for input(s): INR, PROTIME in the last 168 hours. Cardiac Enzymes: No results for input(s): CKTOTAL, CKMB, CKMBINDEX, TROPONINI in the last 168 hours. BNP (last 3 results) No results for input(s): PROBNP in the last 8760 hours. HbA1C: Recent Labs    08/31/17 1730  HGBA1C 7.1*   CBG: Recent Labs  Lab 09/02/17 1914 09/02/17 2133 09/03/17 0800 09/03/17 1128 09/03/17 1624  GLUCAP 149* 153* 162* 209* 232*   Lipid Profile: No results for input(s): CHOL, HDL, LDLCALC, TRIG, CHOLHDL, LDLDIRECT in the last 72 hours. Thyroid Function Tests: No results for input(s): TSH, T4TOTAL, FREET4, T3FREE, THYROIDAB in the last 72 hours. Anemia Panel: No results for input(s): VITAMINB12, FOLATE, FERRITIN, TIBC, IRON, RETICCTPCT in the last 72 hours. Sepsis Labs: Recent Labs  Lab 08/31/17 1057 08/31/17 1325  LATICACIDVEN  0.79 0.80    Recent Results (from the past 240 hour(s))  Culture, blood (routine x 2)     Status: None (Preliminary result)   Collection Time: 08/31/17  1:10 PM  Result Value Ref Range Status   Specimen Description BLOOD RIGHT ANTECUBITAL  Final   Special Requests   Final    BOTTLES DRAWN AEROBIC AND ANAEROBIC Blood Culture adequate volume   Culture   Final    NO GROWTH 3 DAYS Performed at [facility], [place] [address], [place], [place] [address]    Report Status PENDING  Incomplete  Culture, blood (routine x 2)     Status: None (Preliminary result)   Collection Time: 08/31/17  1:15 PM  Result Value Ref Range Status  Specimen Description BLOOD LEFT HAND  Final   Special Requests IN PEDIATRIC BOTTLE Blood Culture adequate volume  Final   Culture   Final    NO GROWTH 3 DAYS Performed at [facility], [address], [place], [place] [address]    Report Status PENDING  Incomplete  Blood Cultures x 2 sites     Status: None (Preliminary result)   Collection Time: 08/31/17  5:30 PM  Result Value Ref Range Status   Specimen Description BLOOD LEFT ARM  Final   Special Requests   Final    BOTTLES DRAWN AEROBIC ONLY Blood Culture adequate volume   Culture   Final    NO GROWTH 3 DAYS Performed at [facility], [address], [place], [place] [address]    Report Status PENDING  Incomplete  Surgical PCR screen     Status: Abnormal   Collection Time: 09/01/17  9:31 PM  Result Value Ref Range Status   MRSA, PCR POSITIVE (A) NEGATIVE Final    Comment: RESULT CALLED TO, READ BACK BY AND VERIFIED WITH: S CORE RN [phone] 01/044/19 A NAVARRO    Staphylococcus aureus POSITIVE (A) NEGATIVE Final    Comment: (NOTE) The Xpert SA Assay (FDA approved for NASAL specimens in patients 87 years of age and older), is one component of a comprehensive surveillance program. It is not intended to diagnose infection nor to guide or monitor treatment.   Aerobic/Anaerobic Culture  (surgical/deep wound)     Status: None (Preliminary result)   Collection Time: 09/02/17  6:33 PM  Result Value Ref Range Status   Specimen Description TOE RIGHT  Final   Special Requests NONE  Final   Gram Stain   Final    RARE WBC PRESENT,BOTH PMN AND MONONUCLEAR RARE GRAM POSITIVE COCCI Performed at [facility], [place] [address] [place],  [address]    Culture PENDING  Incomplete   Report Status PENDING  Incomplete         Radiology Studies: US Renal  Result Date: 09/03/2017 CLINICAL DATA:  Acute kidney injury EXAM: RENAL / URINARY TRACT ULTRASOUND COMPLETE COMPARISON:  CT abdomen/ pelvis 06/23/2016 FINDINGS: Right Kidney: Length: 12 cm. Echogenicity within normal limits. 8.2 mm lower pole echogenic focus with posterior acoustic shadowing consistent with a renal calculus. No mass or hydronephrosis visualized. Left Kidney: Length: 11.7 cm. Echogenicity within normal limits. 2.8 cm anechoic left upper pole renal mass most consistent with cysts. No hydronephrosis visualized. Bladder: Appears normal for degree of bladder distention. IMPRESSION: 1. No obstructive uropathy. 2. Nonobstructing right renal calculus. Electronically Signed   By: Kathreen Devoid   On: 09/03/2017 14:07        Scheduled Meds: . amLODipine  10 mg Oral Daily  . aspirin EC  81 mg Oral Daily  . atorvastatin  20 mg Oral Daily  . carvedilol  12.5 mg Oral BID WC  . insulin aspart  0-15 Units Subcutaneous TID WC  . insulin aspart  0-5 Units Subcutaneous QHS  . insulin glargine  10 Units Subcutaneous Daily  . lacosamide  200 mg Oral BID  . metroNIDAZOLE  500 mg Oral Q8H  . multivitamin with minerals  1 tablet Oral Daily  . mupirocin ointment   Nasal BID  . protein supplement shake  11 oz Oral BID BM   Continuous Infusions: . sodium chloride 75 mL/hr at 09/03/17 1228  . ceFEPime (MAXIPIME) IV 1 g (09/01/17 2008)  . lactated ringers 75 mL/hr  at 09/02/17 1731  . vancomycin       LOS: 3 days     Time spent: 35 minutes    Hosie Poisson, MD [facility] Pager [phone] If 7PM-7AM, please contact night-coverage [email or web address] Password [facility] 09/03/2017, 5:21 PM

## 2017-09-04 LAB — IRON AND TIBC
Iron: 16 ug/dL — ABNORMAL LOW (ref 45–182)
Saturation Ratios: 14 % — ABNORMAL LOW (ref 17.9–39.5)
TIBC: 115 ug/dL — ABNORMAL LOW (ref 250–450)
UIBC: 99 ug/dL

## 2017-09-04 LAB — BASIC METABOLIC PANEL
Anion gap: 6 (ref 5–15)
BUN: 65 mg/dL — ABNORMAL HIGH (ref 6–20)
CO2: 18 mmol/L — ABNORMAL LOW (ref 22–32)
Calcium: 8 mg/dL — ABNORMAL LOW (ref 8.9–10.3)
Chloride: 111 mmol/L (ref 101–111)
Creatinine, Ser: 3.6 mg/dL — ABNORMAL HIGH (ref 0.61–1.24)
GFR calc Af Amer: 19 mL/min — ABNORMAL LOW (ref >60)
GFR calc non Af Amer: 16 mL/min — ABNORMAL LOW (ref >60)
Glucose, Bld: 178 mg/dL — ABNORMAL HIGH (ref 65–99)
Potassium: 4.5 mmol/L (ref 3.5–5.1)
Sodium: 135 mmol/L (ref 135–145)

## 2017-09-04 LAB — FERRITIN: Ferritin: 377 ng/mL — ABNORMAL HIGH (ref 24–336)

## 2017-09-04 LAB — GLUCOSE, CAPILLARY
Glucose-Capillary: 169 mg/dL — ABNORMAL HIGH (ref 65–99)
Glucose-Capillary: 197 mg/dL — ABNORMAL HIGH (ref 65–99)
Glucose-Capillary: 214 mg/dL — ABNORMAL HIGH (ref 65–99)

## 2017-09-04 LAB — CBC
HCT: 26.3 % — ABNORMAL LOW (ref 39.0–52.0)
Hemoglobin: 8.6 g/dL — ABNORMAL LOW (ref 13.0–17.0)
MCH: 29.5 pg (ref 26.0–34.0)
MCHC: 32.7 g/dL (ref 30.0–36.0)
MCV: 90.1 fL (ref 78.0–100.0)
Platelets: 499 10*3/uL — ABNORMAL HIGH (ref 150–400)
RBC: 2.92 MIL/uL — ABNORMAL LOW (ref 4.22–5.81)
RDW: 13.9 % (ref 11.5–15.5)
WBC: 28.8 10*3/uL — ABNORMAL HIGH (ref 4.0–10.5)

## 2017-09-04 LAB — FOLATE: Folate: 11.9 ng/mL (ref >5.9)

## 2017-09-04 LAB — RETICULOCYTES
RBC.: 3.04 MIL/uL — ABNORMAL LOW (ref 4.22–5.81)
Retic Count, Absolute: 24.3 10*3/uL (ref 19.0–186.0)
Retic Ct Pct: 0.8 % (ref 0.4–3.1)

## 2017-09-04 LAB — VANCOMYCIN, PEAK: Vancomycin Pk: 35 ug/mL (ref 30–40)

## 2017-09-04 LAB — VITAMIN B12: Vitamin B-12: 467 pg/mL (ref 180–914)

## 2017-09-04 NOTE — Anesthesia Postprocedure Evaluation (Signed)
Anesthesia Post Note  Patient: Mark [name]  Procedure(s) Performed: AMPUTATION RAY (Right Foot)     Patient location during evaluation: PACU Anesthesia Type: General Level of consciousness: awake and alert Pain management: pain level controlled Vital Signs Assessment: post-procedure vital signs reviewed and stable Respiratory status: spontaneous breathing, nonlabored ventilation, respiratory function stable and patient connected to nasal cannula oxygen Cardiovascular status: blood pressure returned to baseline and stable Postop Assessment: no apparent nausea or vomiting Anesthetic complications: no    Last Vitals:  Vitals:   09/03/17 2123 09/04/17 0445  BP: 130/70 136/72  Pulse: 68 70  Resp: 18 18  Temp: 37.3 C 37 C  SpO2: 94% 96%    Last Pain:  Vitals:   09/04/17 0445  TempSrc: Oral  PainSc:                  [place] S

## 2017-09-04 NOTE — Progress Notes (Signed)
PROGRESS NOTE    Mark [name]  SWN:[id number] DOB: 1951-05-22 DOA: 08/31/2017 PCP: [name], MD   Brief Narrative:   Mark [name] is a 67 year old male with a past medical history significant for hypertension, diabetes, chronic kidney disease is stage III, seizures, left BKA and chronic diastolic heart failure; who presented to the emergency department secondary to right foot redness, swelling and drainage.  Patient also reported subjective fever/chills and no feeling well.  Patient was seen by his podiatrist about 10-12 days ago and had remove a corn on his right fourth toe. Patient was using neosporin and wearing band-aids.  On the day of admission patient noticed increase inflammation and drainage out of his foot and decided to seek medical attention.  MRI of the right foot shows 1. Diffuse cellulitis and myofasciitis. 2. Fluid collections along the dorsum of the forefoot along the fourth digit could be small abscesses. 3. Osteomyelitis involving the proximal and middle phalanges of the fourth toe.  Orthopedics consulted and he underwent  Fourth and fifth ray amputation right foot, VAC applied.  Assessment & Plan:   Principal Problem:   Diabetic foot ulcer ([place]) Active Problems:   S/P BKA (below knee amputation) ([place])   Essential hypertension   Chronic diastolic CHF (congestive heart failure) (HCC)   Diabetes mellitus, type 2 (HCC)   GERD (gastroesophageal reflux disease)   CKD (chronic kidney disease) stage 3, GFR 30-59 ml/min (HCC)   Seizure disorder (HCC)   Diabetic right foot ulcer/osteomyelitis of the fourth toe/absence on the dorsum of the right forefoot Orthopedics consulted andhe underwent  Fourth and fifth ray amputation right foot, VAC applied.   On  vancomycin and cefepime, flagyl, day 5 of antibiotics. Pain control. Physical therapy.    Acute on chronic kidney disease stage III Probably secondary to ATN. FeNa is 2. Creatinine stabilized around  3.6.  [place] does not show any hydronephrosis.       Chronic diastolic heart failure Compensated.   Type 2 diabetes mellitus  CBG (last 3)  Recent Labs    09/03/17 1624 09/03/17 2127 09/04/17 0735  GLUCAP 232* 249* 169*    Increase lantus to 15 units daily.  Resume SSI.    Hypertension well-controlled   Leukocytosis secondary to Diabetic foot ulcer infection. Repeat cbc in am shows persistent leukocytosis around 28,000.    Anemia of chronic disease:  Hemoglobin stable around 8.6.   DVT prophylaxis: Subcu heparin  code Status: Full code Family Communication: None at bedside, discussed the plan with the patient. Disposition Plan: pending PT evaluation.    Consultants:   Orthopedics.    Procedures: MRI of the foot.   Antimicrobials:vancomycin and cefepime.   Subjective: Pain not well controlled.   Objective: Vitals:   09/03/17 0554 09/03/17 1337 09/03/17 2123 09/04/17 0445  BP: (!) 141/73 136/76 130/70 136/72  Pulse: 75 70 68 70  Resp: 19 19 18 18   Temp: 99.4 F (37.4 C) 98.7 F (37.1 C) 99.1 F (37.3 C) 98.6 F (37 C)  TempSrc: Oral Oral Oral Oral  SpO2: 97% 94% 94% 96%  Weight: 106.1 kg (234 lb)   107.9 kg (237 lb 14 oz)  Height:        Intake/Output Summary (Last 24 hours) at 09/04/2017 0858 Last data filed at 09/04/2017 0807 Gross per 24 hour  Intake 2492.5 ml  Output 1300 ml  Net 1192.5 ml   Filed Weights   09/02/17 0546 09/03/17 0554 09/04/17 0445  Weight: 104.4 kg (  230 lb 2.6 oz) 106.1 kg (234 lb) 107.9 kg (237 lb 14 oz)    Examination:  General exam: Appears comfortable.  Respiratory system: Air entry bilaterally no wheezing or Cardiovascular system: S1 & S2 heard, RRR. No JVD, . Gastrointestinal system: Abdomen is soft, nontender, nondistended with good bowel sounds Central nervous system: Alert and oriented. No focal neurological deficits. Extremities: right foot bandaged with wound vac on, not draining much.  Psychiatry:  Mood &  affect appropriate.     Data Reviewed: I have personally reviewed following labs and imaging studies  CBC: Recent Labs  Lab 08/31/17 1050 08/31/17 1730 09/01/17 0545 09/02/17 1054 09/03/17 0630 09/04/17 0604  WBC 33.1* 33.8* 33.4* 33.2* 29.9* 28.8*  NEUTROABS 28.1*  --   --  28.2*  --   --   HGB 9.5* 9.3* 8.9* 8.8* 8.7* 8.6*  HCT 28.3* 28.6* 27.1* 27.1* 26.7* 26.3*  MCV 89.6 91.1 90.3 89.4 89.3 90.1  PLT 354 373 362 412* 432* 283*   Basic Metabolic Panel: Recent Labs  Lab 08/31/17 1050 08/31/17 1730 09/01/17 0545 09/03/17 0630 09/04/17 0604  NA 136  --  135 137 135  K 5.0  --  4.2 4.6 4.5  CL 110  --  111 114* 111  CO2 17*  --  17* 19* 18*  GLUCOSE 169*  --  158* 186* 178*  BUN 57*  --  57* 59* 65*  CREATININE 3.69* 3.50* 3.80* 3.58* 3.60*  CALCIUM 8.8*  --  8.4* 8.0* 8.0*  MG  --  1.8  --   --   --   PHOS  --  3.3  --   --   --    GFR: Estimated Creatinine Clearance: 26.8 mL/min (A) (by C-G formula based on SCr of 3.6 mg/dL (H)). Liver Function Tests: Recent Labs  Lab 08/31/17 1050  AST 19  ALT 14*  ALKPHOS 106  BILITOT 0.9  PROT 7.8  ALBUMIN 3.0*   No results for input(s): LIPASE, AMYLASE in the last 168 hours. No results for input(s): AMMONIA in the last 168 hours. Coagulation Profile: No results for input(s): INR, PROTIME in the last 168 hours. Cardiac Enzymes: No results for input(s): CKTOTAL, CKMB, CKMBINDEX, TROPONINI in the last 168 hours. BNP (last 3 results) No results for input(s): PROBNP in the last 8760 hours. HbA1C: No results for input(s): HGBA1C in the last 72 hours. CBG: Recent Labs  Lab 09/03/17 0800 09/03/17 1128 09/03/17 1624 09/03/17 2127 09/04/17 0735  GLUCAP 162* 209* 232* 249* 169*   Lipid Profile: No results for input(s): CHOL, HDL, LDLCALC, TRIG, CHOLHDL, LDLDIRECT in the last 72 hours. Thyroid Function Tests: No results for input(s): TSH, T4TOTAL, FREET4, T3FREE, THYROIDAB in the last 72 hours. Anemia Panel: No  results for input(s): VITAMINB12, FOLATE, FERRITIN, TIBC, IRON, RETICCTPCT in the last 72 hours. Sepsis Labs: Recent Labs  Lab 08/31/17 1057 08/31/17 1325  LATICACIDVEN 0.79 0.80    Recent Results (from the past 240 hour(s))  Culture, blood (routine x 2)     Status: None (Preliminary result)   Collection Time: 08/31/17  1:10 PM  Result Value Ref Range Status   Specimen Description BLOOD RIGHT ANTECUBITAL  Final   Special Requests   Final    BOTTLES DRAWN AEROBIC AND ANAEROBIC Blood Culture adequate volume   Culture   Final    NO GROWTH 3 DAYS Performed at [facility], [place] [address], [place], [name] [address]    Report Status PENDING  Incomplete  Culture, blood (routine x 2)     Status: None (Preliminary result)   Collection Time: 08/31/17  1:15 PM  Result Value Ref Range Status   Specimen Description BLOOD LEFT HAND  Final   Special Requests IN PEDIATRIC BOTTLE Blood Culture adequate volume  Final   Culture   Final    NO GROWTH 3 DAYS Performed at [facility], [place] [address] [place], [place] [address]    Report Status PENDING  Incomplete  Blood Cultures x 2 sites     Status: None (Preliminary result)   Collection Time: 08/31/17  5:30 PM  Result Value Ref Range Status   Specimen Description BLOOD LEFT ARM  Final   Special Requests   Final    BOTTLES DRAWN AEROBIC ONLY Blood Culture adequate volume   Culture   Final    NO GROWTH 3 DAYS Performed at [facility], [address] [place], [place] [address]    Report Status PENDING  Incomplete  Surgical PCR screen     Status: Abnormal   Collection Time: 09/01/17  9:31 PM  Result Value Ref Range Status   MRSA, PCR POSITIVE (A) NEGATIVE Final    Comment: RESULT CALLED TO, READ BACK BY AND VERIFIED WITH: S CORE RN [phone] 01/044/19 A NAVARRO    Staphylococcus aureus POSITIVE (A) NEGATIVE Final    Comment: (NOTE) The Xpert SA Assay (FDA approved for NASAL specimens in patients 61 years of age and older),  is one component of a comprehensive surveillance program. It is not intended to diagnose infection nor to guide or monitor treatment.   Aerobic/Anaerobic Culture (surgical/deep wound)     Status: None (Preliminary result)   Collection Time: 09/02/17  6:33 PM  Result Value Ref Range Status   Specimen Description TOE RIGHT  Final   Special Requests NONE  Final   Gram Stain   Final    RARE WBC PRESENT,BOTH PMN AND MONONUCLEAR RARE GRAM POSITIVE COCCI Performed at [facility], [place] [address], [place], [place] [address]    Culture PENDING  Incomplete   Report Status PENDING  Incomplete         Radiology Studies: US Renal  Result Date: 09/03/2017 CLINICAL DATA:  Acute kidney injury EXAM: RENAL / URINARY TRACT ULTRASOUND COMPLETE COMPARISON:  CT abdomen/ pelvis 06/23/2016 FINDINGS: Right Kidney: Length: 12 cm. Echogenicity within normal limits. 8.2 mm lower pole echogenic focus with posterior acoustic shadowing consistent with a renal calculus. No mass or hydronephrosis visualized. Left Kidney: Length: 11.7 cm. Echogenicity within normal limits. 2.8 cm anechoic left upper pole renal mass most consistent with cysts. No hydronephrosis visualized. Bladder: Appears normal for degree of bladder distention. IMPRESSION: 1. No obstructive uropathy. 2. Nonobstructing right renal calculus. Electronically Signed   By: Kathreen Devoid   On: 09/03/2017 14:07        Scheduled Meds: . amLODipine  10 mg Oral Daily  . aspirin EC  81 mg Oral Daily  . atorvastatin  20 mg Oral Daily  . carvedilol  12.5 mg Oral BID WC  . insulin aspart  0-15 Units Subcutaneous TID WC  . insulin aspart  0-5 Units Subcutaneous QHS  . insulin glargine  15 Units Subcutaneous Daily  . lacosamide  200 mg Oral BID  . metroNIDAZOLE  500 mg Oral Q8H  . multivitamin with minerals  1 tablet Oral Daily  . mupirocin ointment   Nasal BID  . protein supplement shake  11  oz Oral BID BM   Continuous Infusions: . sodium chloride  75 mL/hr at 09/04/17 0006  . ceFEPime (MAXIPIME) IV 1 g (09/03/17 1811)  . lactated ringers 75 mL/hr at 09/02/17 1731  . vancomycin       LOS: 4 days    Time spent: 35 minutes    Hosie Poisson, MD [facility] Pager [phone] If 7PM-7AM, please contact night-coverage [email or web address] Password [email or web address] 09/04/2017, 8:58 AM

## 2017-09-05 ENCOUNTER — Telehealth (INDEPENDENT_AMBULATORY_CARE_PROVIDER_SITE_OTHER): Payer: Self-pay | Admitting: Orthopaedic Surgery

## 2017-09-05 ENCOUNTER — Encounter (HOSPITAL_COMMUNITY): Payer: Self-pay | Admitting: Orthopaedic Surgery

## 2017-09-05 LAB — BASIC METABOLIC PANEL
Anion gap: 5 (ref 5–15)
BUN: 57 mg/dL — ABNORMAL HIGH (ref 6–20)
CO2: 17 mmol/L — ABNORMAL LOW (ref 22–32)
Calcium: 8.2 mg/dL — ABNORMAL LOW (ref 8.9–10.3)
Chloride: 115 mmol/L — ABNORMAL HIGH (ref 101–111)
Creatinine, Ser: 3.19 mg/dL — ABNORMAL HIGH (ref 0.61–1.24)
GFR calc Af Amer: 22 mL/min — ABNORMAL LOW (ref >60)
GFR calc non Af Amer: 19 mL/min — ABNORMAL LOW (ref >60)
Glucose, Bld: 169 mg/dL — ABNORMAL HIGH (ref 65–99)
Potassium: 5 mmol/L (ref 3.5–5.1)
Sodium: 137 mmol/L (ref 135–145)

## 2017-09-05 LAB — GLUCOSE, CAPILLARY
Glucose-Capillary: 150 mg/dL — ABNORMAL HIGH (ref 65–99)
Glucose-Capillary: 228 mg/dL — ABNORMAL HIGH (ref 65–99)
Glucose-Capillary: 171 mg/dL — ABNORMAL HIGH (ref 65–99)
Glucose-Capillary: 197 mg/dL — ABNORMAL HIGH (ref 65–99)
Glucose-Capillary: 187 mg/dL — ABNORMAL HIGH (ref 65–99)
Glucose-Capillary: 201 mg/dL — ABNORMAL HIGH (ref 65–99)

## 2017-09-05 LAB — CULTURE, BLOOD (ROUTINE X 2)
Culture: NO GROWTH
Culture: NO GROWTH
Culture: NO GROWTH
Special Requests: ADEQUATE
Special Requests: ADEQUATE
Special Requests: ADEQUATE

## 2017-09-05 NOTE — Progress Notes (Signed)
Pharmacy Antibiotic Note  Mark [name] is a 67 y.o. male admitted on 08/31/2017 with possible diabetic foot infection and osteomyelitis.  Pharmacy has been consulted for vancomycin and cefepime dosing; MD dosing flagyl.  Day #6 antibiotics  afebrile  Renal: CKD - SCr = 3.19 (improved from previous values)  CBC: remains elevated, some improvement  ABI - mild arterial dz RLE  1/4 Ortho 4th/5th ray amputation  Plan:  Continue vancomycin 1250mg  IV q48h   Dose adjusted for L BKA  Checking levels to ensure appropriate dose  Cefepime 1g IV q24h   Flagyl 500mg  PO q8h per MD  F/u de-escalation with culture results from OR (currently S. Aureus)  Height: 6\' 2"  (188 cm) Weight: 235 lb 10.8 oz (106.9 kg) IBW/kg (Calculated) : 82.2  Temp (24hrs), Avg:99 F (37.2 C), Min:98.4 F (36.9 C), Max:99.5 F (37.5 C)  Recent Labs  Lab 08/31/17 1057 08/31/17 1325 08/31/17 1730 09/01/17 0545 09/02/17 1054 09/03/17 0630 09/04/17 0604 09/04/17 2240 09/05/17 0624  WBC  --   --  33.8* 33.4* 33.2* 29.9* 28.8*  --   --   CREATININE  --   --  3.50* 3.80*  --  3.58* 3.60*  --  3.19*  LATICACIDVEN 0.79 0.80  --   --   --   --   --   --   --   VANCOPEAK  --   --   --   --   --   --   --  35  --     Estimated Creatinine Clearance: 30.1 mL/min (A) (by C-G formula based on SCr of 3.19 mg/dL (H)).    No Known Allergies  Antimicrobials this admission:  1/2 Vancomycin >> 1/2 Cefepime >> 1/2 Flagyl >> Dose adjustments this admission:  1/6 2240 VPk = 35 (dose 1250gm IV q48h - dose 1/6 at 19:06) Microbiology results:  1/2 BCx x 3: NGTD 1/3 MRSA PCR: Positive 1/4 R toe (doesn't specify if soft tissue, bone, or other fluid): Mod S aureus  Thank you for allowing pharmacy to be a part of this patient's care.  Doreene Eland, PharmD, BCPS.   Pager: [phone] 09/05/2017 7:45 AM

## 2017-09-05 NOTE — Progress Notes (Signed)
Inpatient Diabetes Program Recommendations  AACE/ADA: New Consensus Statement on Inpatient Glycemic Control (2015)  Target Ranges:  Prepandial:   less than 140 mg/dL      Peak postprandial:   less than 180 mg/dL (1-2 hours)      Critically ill patients:  140 - 180 mg/dL   Results for Mark [name], Mark [name] (MRN [id number]) as of 09/05/2017 11:14  Ref. Range 09/04/2017 07:35 09/04/2017 11:24 09/04/2017 16:28 09/04/2017 20:43 09/05/2017 07:05  Glucose-Capillary Latest Ref Range: 65 - 99 mg/dL 169 (H) 197 (H) 214 (H) 228 (H) 171 (H)   Review of Glycemic Control  Diabetes history: DM2 Outpatient Diabetes medications: Lantus 10 units daily Current orders for Inpatient glycemic control: Lantus 15 units daily, Novolog 0-15 units TID with meals, Novolog 0-5 units QHS  Inpatient Diabetes Program Recommendations: Insulin - Meal Coverage: Please consider ordering Novolog 4 units TID with meals for meal coverage if patient eats at least 50% of meals.  Thanks, Barnie Alderman, RN, MSN, CDE Diabetes Coordinator Inpatient Diabetes Program [phone] (Team Pager from 8am to 5pm)

## 2017-09-05 NOTE — Care Management Important Message (Signed)
Important Message  Patient Details  Name: Mark [name] MRN: [id number] Date of Birth: 06-23-1951   Medicare Important Message Given:  Yes    Kerin Salen 09/05/2017, 12:10 [place] Message  Patient Details  Name: Mark [name] MRN: [id number] Date of Birth: July 30, 1951   Medicare Important Message Given:  Yes    Kerin Salen 09/05/2017, 12:10 PM

## 2017-09-05 NOTE — Evaluation (Signed)
Physical Therapy Evaluation Patient Details Name: Mark [name] MRN: [id number] DOB: [birth year] Today's Date: 09/05/2017   History of Present Illness  67 year old male with a past medical history significant for hypertension, diabetes, chronic kidney disease is stage III, seizures, left BKA and chronic diastolic heart failure; who presented to the emergency department secondary to right foot redness, swelling and drainage. Dx of osteomyelitis, s/p 4-5th ray amputations  Clinical Impression  Pt admitted with above diagnosis. Pt currently with functional limitations due to the deficits listed below (see PT Problem List). Pt unable to don his L BKA prosthesis due to swelling in LLE. Attempted to stand with RW, but prothesis still didn't click into place. Phoned pt's prosthetist, Fritz Pickerel, at [employer] and left message requesting his input. Ortho MD-please clarify weight bearing status and order post op shoe for RLE.  Pt will benefit from skilled PT to increase their independence and safety with mobility to allow discharge to the venue listed below.       Follow Up Recommendations SNF vs. HHPT depending on progress    Equipment Recommendations  None recommended by PT    Recommendations for Other Services       Precautions / Restrictions Precautions Precautions: Fall Precaution Comments: 1 fall in past year Restrictions Other Position/Activity Restrictions: none stated, assume WB thru heel      Mobility  Bed Mobility Overal bed mobility: Modified Independent             General bed mobility comments: HOB up 50*  Transfers Overall transfer level: Needs assistance Equipment used: Rolling walker (2 wheeled) Transfers: Sit to/from Stand Sit to Stand: From elevated surface;Min assist         General transfer comment: bed elevated, pt stood to attempt to get L BKA prothesis to click on, but was unable to do so, so did not attempt ambulation, pt advised to maintain WB on R  heel  Ambulation/Gait                Stairs            Wheelchair Mobility    Modified Rankin (Stroke Patients Only)       Balance Overall balance assessment: Modified Independent                                           Pertinent Vitals/Pain Pain Assessment: 0-10 Pain Score: 10-Worst pain ever Pain Location: R foot Pain Descriptors / Indicators: Sore Pain Intervention(s): Limited activity within patient's tolerance;Monitored during session;Patient requesting pain meds-RN notified    Home Living Family/patient expects to be discharged to:: Private residence Living Arrangements: Spouse/significant other Available Help at Discharge: Family;Available PRN/intermittently Type of Home: House Home Access: Ramped entrance     Home Layout: One level Home Equipment: Clinical cytogeneticist - 2 wheels;Wheelchair - manual;Hand held shower head      Prior Function Level of Independence: Independent with assistive device(s)         Comments: h/o L BKA, independent with RW and prosthesis     Hand Dominance        Extremity/Trunk Assessment   Upper Extremity Assessment Upper Extremity Assessment: Overall WFL for tasks assessed    Lower Extremity Assessment Lower Extremity Assessment: Overall WFL for tasks assessed(edema noted R foot, edema in LLE prevented pt from being able to don his BKA prosthesis)    Cervical / Trunk  Assessment Cervical / Trunk Assessment: Normal  Communication   Communication: No difficulties  Cognition Arousal/Alertness: Awake/alert Behavior During Therapy: WFL for tasks assessed/performed Overall Cognitive Status: Within Functional Limits for tasks assessed                                        General Comments      Exercises     Assessment/Plan    PT Assessment Patient needs continued PT services  PT Problem List Decreased strength;Decreased activity tolerance;Decreased mobility        PT Treatment Interventions Gait training;Functional mobility training;Therapeutic activities;Therapeutic exercise;Patient/family education    PT Goals (Current goals can be found in the Care Plan section)  Acute Rehab PT Goals Patient Stated Goal: to be able to walk PT Goal Formulation: With patient Time For Goal Achievement: 09/19/17 Potential to Achieve Goals: Good    Frequency Min 3X/week   Barriers to discharge        Co-evaluation               AM-PAC PT "6 Clicks" Daily Activity  Outcome Measure Difficulty turning over in bed (including adjusting bedclothes, sheets and blankets)?: A Little Difficulty moving from lying on back to sitting on the side of the bed? : A Lot Difficulty sitting down on and standing up from a chair with arms (e.g., wheelchair, bedside commode, etc,.)?: Unable Help needed moving to and from a bed to chair (including a wheelchair)?: A Lot Help needed walking in hospital room?: A Lot Help needed climbing 3-5 steps with a railing? : Total 6 Click Score: 11    End of Session Equipment Utilized During Treatment: Gait belt Activity Tolerance: Patient tolerated treatment well Patient left: in bed(sitting on EOB) Nurse Communication: Mobility status;Other (comment);Patient requests pain meds(need WB status and order for post op shoe) PT Visit Diagnosis: Difficulty in walking, not elsewhere classified (R26.2);Pain Pain - Right/Left: Right Pain - part of body: Ankle and joints of foot    Time: 1511-1547 PT Time Calculation (min) (ACUTE ONLY): 36 min   Charges:   PT Evaluation $PT Eval Moderate Complexity: 1 Mod PT Treatments $Therapeutic Activity: 8-22 mins   PT G Codes:          Philomena Doheny 09/05/2017, 4:03 PM [phone]

## 2017-09-05 NOTE — Telephone Encounter (Signed)
Patients wife called asking about the operative notes that were supposed to be in [place]? Can you please give her a call back to advise # [phone]

## 2017-09-05 NOTE — Consult Note (Signed)
[facility] Nurse wound consult note Reason for Consult: First surgical dressing change, right foot digit 4 and 5 amputation site.  I am assisted in today's dressing change by Bedside RN, Shanon Brow. Wound type: Surgical Pressure Injury POA: NA Measurement: 6cm x 3.5cm x 2cm Wound bed: 60% red, 40% yellow nonviable slough Drainage (amount, consistency, odor) small serous Periwound:macerated, 3 sutures Dressing procedure/placement/frequency: NPWT dressing was alarming "occluded" at the time of my arrival this morning.  Dressing removed after patient medicated, saturated with NS to make foam removal easier.  Patient tolerated removal well.  Wound cleansed and gently, but thoroughly patted dry.  A pectin skin barrier ring is used to encircle wound; two (2) pieces of black foam used to obliterate dead space.  Drape applied and additional drape applied to intact skin on anterior foot and up to malleolus.  A foam bridge (connected to wound filler drape) is placed on top of this additional drape and secured with topper drape.  Dressing is attached to negative pressure (143mmHg continuous negative pressure) and a seal is achieved. Skin beneath tubing is padded with several 4x4s and dressing and tubing are secured with a Kerlix roll gauze wrap followed by an ACE bandage. Patient tolerated procedure well.  Dressings will be performed three times weekly on M/W/F.  WOC Nurse will assist with this dressing until discharge.  Patient will require the services of a HHRN for NPWT dressing changed.  [facility] nursing team will follow while in house, and will remain available to this patient, the nursing and medical teams.   Thanks, Maudie Flakes, MSN, RN, [place], Arther Abbott  Pager# [phone]

## 2017-09-05 NOTE — Progress Notes (Addendum)
   Subjective: 3 Days Post-Op Procedure(s) (LRB): AMPUTATION RAY (Right) Patient reports pain as mild.    Objective: Vital signs in last 24 hours: Temp:  [99.1 F (37.3 C)-99.5 F (37.5 C)] 99.1 F (37.3 C) (01/07 1427) Pulse Rate:  [74-79] 74 (01/07 1427) Resp:  [15-16] 16 (01/07 1427) BP: (138-162)/(70-75) 138/70 (01/07 1427) SpO2:  [94 %-96 %] 96 % (01/07 1427) Weight:  [235 lb 10.8 oz (106.9 kg)] 235 lb 10.8 oz (106.9 kg) (01/07 0509)  Intake/Output from previous day: 01/06 0701 - 01/07 0700 In: 2010 [P.O.:720; I.V.:675] Out: 1800 [Urine:1800] Intake/Output this shift: Total I/O In: -  Out: 250 [Urine:250]  Recent Labs    09/03/17 0630 09/04/17 0604  HGB 8.7* 8.6*   Recent Labs    09/03/17 0630 09/04/17 0604 09/04/17 0920  WBC 29.9* 28.8*  --   RBC 2.99* 2.92* 3.04*  HCT 26.7* 26.3*  --   PLT 432* 499*  --    Recent Labs    09/04/17 0604 09/05/17 0624  NA 135 137  K 4.5 5.0  CL 111 115*  CO2 18* 17*  BUN 65* 57*  CREATININE 3.60* 3.19*  GLUCOSE 178* 169*  CALCIUM 8.0* 8.2*   No results for input(s): LABPT, INR in the last 72 hours.  VAC low leak rate.  No results found.  Assessment/Plan: 3 Days Post-Op Procedure(s) (LRB): AMPUTATION RAY (Right) Up with therapy. MRSA from OR cultures. Getting mupiricin nasal and CHG sponge bath done pre-op.  Creatinine improving . WBC still elevated at 29 K but better than 33. Continue VAC, I will look at it Wed AM or Friday AM before VAC change.  OK for right foot WB with Darco shoe.  Mark [name] 09/05/2017, 5:56 PM

## 2017-09-05 NOTE — Progress Notes (Signed)
PROGRESS NOTE    Mark [name]  TJQ:[id number] DOB: 10/21/1950 DOA: 08/31/2017 PCP: [name], MD   Brief Narrative:   Mark [name] is a 67 year old male with a past medical history significant for hypertension, diabetes, chronic kidney disease is stage III, seizures, left BKA and chronic diastolic heart failure; who presented to the emergency department secondary to right foot redness, swelling and drainage.  Patient also reported subjective fever/chills and no feeling well.  Patient was seen by his podiatrist about 10-12 days ago and had remove a corn on his right fourth toe. Patient was using neosporin and wearing band-aids.  On the day of admission patient noticed increase inflammation and drainage out of his foot and decided to seek medical attention.  MRI of the right foot shows 1. Diffuse cellulitis and myofasciitis. 2. Fluid collections along the dorsum of the forefoot along the fourth digit could be small abscesses. 3. Osteomyelitis involving the proximal and middle phalanges of the fourth toe.  Orthopedics consulted and he underwent  Fourth and fifth ray amputation right foot, VAC applied.  Assessment & Plan:   Principal Problem:   Diabetic foot ulcer ([place]) Active Problems:   S/P BKA (below knee amputation) ([place])   Essential hypertension   Chronic diastolic CHF (congestive heart failure) (HCC)   Diabetes mellitus, type 2 (HCC)   GERD (gastroesophageal reflux disease)   CKD (chronic kidney disease) stage 3, GFR 30-59 ml/min (HCC)   Seizure disorder (HCC)   Diabetic right foot ulcer/osteomyelitis of the fourth toe/absence on the dorsum of the right forefoot Orthopedics consulted andhe underwent  Fourth and fifth ray amputation right foot, VAC applied.   On  vancomycin and cefepime, flagyl, completed 5  Days of antibiotics. Cultures grew MRSA, stop flagyl and cefepime and continue with vancomycin.  Pain control. Physical therapy recommending SNF. Marland Kitchen    Acute  on chronic kidney disease stage III Probably secondary to ATN. FeNa is 2. Creatinine IMRPOVING. .  [place] does not show any hydronephrosis.       Chronic diastolic heart failure Compensated.   Type 2 diabetes mellitus  CBG (last 3)  Recent Labs    09/05/17 0705 09/05/17 1212 09/05/17 1655  GLUCAP 171* 197* 187*    Increase lantus to 15 units daily.  Resume SSI.  No change.    Hypertension well-controlled. No change in m eds.    Leukocytosis secondary to Diabetic foot ulcer infection.continues to improve.   Anemia of chronic disease:  Hemoglobin stable around 8.6. Anemia panel shows low iron. Iron supplementation ondischarge.    DVT prophylaxis: Subcu heparin  code Status: Full code Family Communication: None at bedside, discussed the plan with the patient. Disposition Plan: SNF in 2 days after ortho clears him.    Consultants:   Orthopedics.    Procedures: MRI of the foot.   Antimicrobials:vancomycin and cefepime.   Subjective: No new complaints.   Objective: Vitals:   09/04/17 0445 09/04/17 1330 09/05/17 0509 09/05/17 1427  BP: 136/72 125/72 (!) 162/75 138/70  Pulse: 70 63 79 74  Resp: 18 18 15 16   Temp: 98.6 F (37 C) 98.4 F (36.9 C) 99.5 F (37.5 C) 99.1 F (37.3 C)  TempSrc: Oral Oral Oral Oral  SpO2: 96% 97% 94% 96%  Weight: 107.9 kg (237 lb 14 oz)  106.9 kg (235 lb 10.8 oz)   Height:        Intake/Output Summary (Last 24 hours) at 09/05/2017 1820 Last data filed at 09/05/2017 1100  Gross per 24 hour  Intake 240 ml  Output 1350 ml  Net -1110 ml   Filed Weights   09/03/17 0554 09/04/17 0445 09/05/17 0509  Weight: 106.1 kg (234 lb) 107.9 kg (237 lb 14 oz) 106.9 kg (235 lb 10.8 oz)    Examination:  General exam: Appears comfortable. Not in distress.  Respiratory system: Air entry bilaterally no wheezing orrhonchi.  Cardiovascular system: S1 & S2 heard, RRR. No JVD, . Gastrointestinal system: Abdomen is soft, nontender, nondistended with  good bowel sounds Central nervous system: Alert and oriented. No focal neurological deficits. Extremities: right foot bandaged with wound vac on, non tender. WB as tolerated.  Psychiatry:  Mood & affect appropriate.     Data Reviewed: I have personally reviewed following labs and imaging studies  CBC: Recent Labs  Lab 08/31/17 1050 08/31/17 1730 09/01/17 0545 09/02/17 1054 09/03/17 0630 09/04/17 0604  WBC 33.1* 33.8* 33.4* 33.2* 29.9* 28.8*  NEUTROABS 28.1*  --   --  28.2*  --   --   HGB 9.5* 9.3* 8.9* 8.8* 8.7* 8.6*  HCT 28.3* 28.6* 27.1* 27.1* 26.7* 26.3*  MCV 89.6 91.1 90.3 89.4 89.3 90.1  PLT 354 373 362 412* 432* 384*   Basic Metabolic Panel: Recent Labs  Lab 08/31/17 1050 08/31/17 1730 09/01/17 0545 09/03/17 0630 09/04/17 0604 09/05/17 0624  NA 136  --  135 137 135 137  K 5.0  --  4.2 4.6 4.5 5.0  CL 110  --  111 114* 111 115*  CO2 17*  --  17* 19* 18* 17*  GLUCOSE 169*  --  158* 186* 178* 169*  BUN 57*  --  57* 59* 65* 57*  CREATININE 3.69* 3.50* 3.80* 3.58* 3.60* 3.19*  CALCIUM 8.8*  --  8.4* 8.0* 8.0* 8.2*  MG  --  1.8  --   --   --   --   PHOS  --  3.3  --   --   --   --    GFR: Estimated Creatinine Clearance: 30.1 mL/min (A) (by C-G formula based on SCr of 3.19 mg/dL (H)). Liver Function Tests: Recent Labs  Lab 08/31/17 1050  AST 19  ALT 14*  ALKPHOS 106  BILITOT 0.9  PROT 7.8  ALBUMIN 3.0*   No results for input(s): LIPASE, AMYLASE in the last 168 hours. No results for input(s): AMMONIA in the last 168 hours. Coagulation Profile: No results for input(s): INR, PROTIME in the last 168 hours. Cardiac Enzymes: No results for input(s): CKTOTAL, CKMB, CKMBINDEX, TROPONINI in the last 168 hours. BNP (last 3 results) No results for input(s): PROBNP in the last 8760 hours. HbA1C: No results for input(s): HGBA1C in the last 72 hours. CBG: Recent Labs  Lab 09/04/17 1628 09/04/17 2043 09/05/17 0705 09/05/17 1212 09/05/17 1655  GLUCAP 214*  228* 171* 197* 187*   Lipid Profile: No results for input(s): CHOL, HDL, LDLCALC, TRIG, CHOLHDL, LDLDIRECT in the last 72 hours. Thyroid Function Tests: No results for input(s): TSH, T4TOTAL, FREET4, T3FREE, THYROIDAB in the last 72 hours. Anemia Panel: Recent Labs    09/04/17 0920  VITAMINB12 467  FOLATE 11.9  FERRITIN 377*  TIBC 115*  IRON 16*  RETICCTPCT 0.8   Sepsis Labs: Recent Labs  Lab 08/31/17 1057 08/31/17 1325  LATICACIDVEN 0.79 0.80    Recent Results (from the past 240 hour(s))  Culture, blood (routine x 2)     Status: None   Collection Time: 08/31/17  1:10 PM  Result  Value Ref Range Status   Specimen Description BLOOD RIGHT ANTECUBITAL  Final   Special Requests   Final    BOTTLES DRAWN AEROBIC AND ANAEROBIC Blood Culture adequate volume   Culture   Final    NO GROWTH 5 DAYS Performed at [facility], [address] [place], [place] [address]    Report Status 09/05/2017 FINAL  Final  Culture, blood (routine x 2)     Status: None   Collection Time: 08/31/17  1:15 PM  Result Value Ref Range Status   Specimen Description BLOOD LEFT HAND  Final   Special Requests IN PEDIATRIC BOTTLE Blood Culture adequate volume  Final   Culture   Final    NO GROWTH 5 DAYS Performed at [facility], [place] [address], [place], [place] [address]    Report Status 09/05/2017 FINAL  Final  Blood Cultures x 2 sites     Status: None   Collection Time: 08/31/17  5:30 PM  Result Value Ref Range Status   Specimen Description BLOOD LEFT ARM  Final   Special Requests   Final    BOTTLES DRAWN AEROBIC ONLY Blood Culture adequate volume   Culture   Final    NO GROWTH 5 DAYS Performed at [facility], [place] [address], [place], [place] [address]    Report Status 09/05/2017 FINAL  Final  Surgical PCR screen     Status: Abnormal   Collection Time: 09/01/17  9:31 PM  Result Value Ref Range Status   MRSA, PCR POSITIVE (A) NEGATIVE Final    Comment: RESULT CALLED TO,  READ BACK BY AND VERIFIED WITH: S CORE RN [phone] 01/044/19 A NAVARRO    Staphylococcus aureus POSITIVE (A) NEGATIVE Final    Comment: (NOTE) The Xpert SA Assay (FDA approved for NASAL specimens in patients 26 years of age and older), is one component of a comprehensive surveillance program. It is not intended to diagnose infection nor to guide or monitor treatment.   Aerobic/Anaerobic Culture (surgical/deep wound)     Status: None (Preliminary result)   Collection Time: 09/02/17  6:33 PM  Result Value Ref Range Status   Specimen Description TOE RIGHT  Final   Special Requests NONE  Final   Gram Stain   Final    RARE WBC PRESENT,BOTH PMN AND MONONUCLEAR RARE GRAM POSITIVE COCCI Performed at [facility], [place] [address], [place], [place] [address]    Culture   Final    MODERATE METHICILLIN RESISTANT STAPHYLOCOCCUS AUREUS NO ANAEROBES ISOLATED; CULTURE IN PROGRESS FOR 5 DAYS    Report Status PENDING  Incomplete   Organism ID, Bacteria METHICILLIN RESISTANT STAPHYLOCOCCUS AUREUS  Final      Susceptibility   Methicillin resistant staphylococcus aureus - MIC*    CIPROFLOXACIN >=8 RESISTANT Resistant     ERYTHROMYCIN >=8 RESISTANT Resistant     GENTAMICIN <=0.5 SENSITIVE Sensitive     OXACILLIN >=4 RESISTANT Resistant     TETRACYCLINE <=1 SENSITIVE Sensitive     VANCOMYCIN 1 SENSITIVE Sensitive     TRIMETH/SULFA <=10 SENSITIVE Sensitive     CLINDAMYCIN <=0.25 SENSITIVE Sensitive     RIFAMPIN <=0.5 SENSITIVE Sensitive     Inducible Clindamycin NEGATIVE Sensitive     * MODERATE METHICILLIN RESISTANT STAPHYLOCOCCUS AUREUS         Radiology Studies: No results found.      Scheduled Meds: . amLODipine  10 mg Oral Daily  . aspirin EC  81 mg Oral  Daily  . atorvastatin  20 mg Oral Daily  . carvedilol  12.5 mg Oral BID WC  . insulin aspart  0-15 Units Subcutaneous TID WC  . insulin aspart  0-5 Units Subcutaneous QHS  . insulin glargine  15 Units Subcutaneous Daily  .  lacosamide  200 mg Oral BID  . metroNIDAZOLE  500 mg Oral Q8H  . multivitamin with minerals  1 tablet Oral Daily  . mupirocin ointment   Nasal BID  . protein supplement shake  11 oz Oral BID BM   Continuous Infusions: . sodium chloride 75 mL/hr at 09/04/17 0006  . ceFEPime (MAXIPIME) IV 1 g (09/05/17 1804)  . lactated ringers 75 mL/hr at 09/02/17 1731  . vancomycin Stopped (09/04/17 2045)     LOS: 5 days    Time spent: 35 minutes    Mark Poisson, MD [facility] Pager [phone] If 7PM-7AM, please contact night-coverage [email or web address] Password [email or web address] 09/05/2017, 6:20 PM

## 2017-09-05 NOTE — Progress Notes (Signed)
Date:  September 05, 2017 Chart reviewed for concurrent status and case management needs.  Will continue to follow patient progress.  Discharge Planning: following for needs  Expected discharge date: September 08 2017 Armine Rizzolo, BSN, RN3, CCM   [phone]  

## 2017-09-06 LAB — CBC
HCT: 25 % — ABNORMAL LOW (ref 39.0–52.0)
Hemoglobin: 8.3 g/dL — ABNORMAL LOW (ref 13.0–17.0)
MCH: 29.6 pg (ref 26.0–34.0)
MCHC: 33.2 g/dL (ref 30.0–36.0)
MCV: 89.3 fL (ref 78.0–100.0)
Platelets: 575 10*3/uL — ABNORMAL HIGH (ref 150–400)
RBC: 2.8 MIL/uL — ABNORMAL LOW (ref 4.22–5.81)
RDW: 13.9 % (ref 11.5–15.5)
WBC: 25.1 10*3/uL — ABNORMAL HIGH (ref 4.0–10.5)

## 2017-09-06 LAB — VANCOMYCIN, TROUGH: Vancomycin Tr: 17 ug/mL (ref 15–20)

## 2017-09-06 LAB — GLUCOSE, CAPILLARY
Glucose-Capillary: 177 mg/dL — ABNORMAL HIGH (ref 65–99)
Glucose-Capillary: 145 mg/dL — ABNORMAL HIGH (ref 65–99)
Glucose-Capillary: 145 mg/dL — ABNORMAL HIGH (ref 65–99)
Glucose-Capillary: 158 mg/dL — ABNORMAL HIGH (ref 65–99)

## 2017-09-06 LAB — BASIC METABOLIC PANEL
Anion gap: 5 (ref 5–15)
BUN: 60 mg/dL — ABNORMAL HIGH (ref 6–20)
CO2: 18 mmol/L — ABNORMAL LOW (ref 22–32)
Calcium: 8.1 mg/dL — ABNORMAL LOW (ref 8.9–10.3)
Chloride: 116 mmol/L — ABNORMAL HIGH (ref 101–111)
Creatinine, Ser: 3.19 mg/dL — ABNORMAL HIGH (ref 0.61–1.24)
GFR calc Af Amer: 22 mL/min — ABNORMAL LOW (ref >60)
GFR calc non Af Amer: 19 mL/min — ABNORMAL LOW (ref >60)
Glucose, Bld: 202 mg/dL — ABNORMAL HIGH (ref 65–99)
Potassium: 5 mmol/L (ref 3.5–5.1)
Sodium: 139 mmol/L (ref 135–145)

## 2017-09-06 NOTE — Progress Notes (Signed)
PROGRESS NOTE    [name]  QJF:[id number] DOB: 1950-12-11 DOA: 08/31/2017 PCP: [name], MD   Brief Narrative:   Mark [name] is a 67 year old male with a past medical history significant for hypertension, diabetes, chronic kidney disease is stage III, seizures, left BKA and chronic diastolic heart failure; who presented to the emergency department secondary to right foot redness, swelling and drainage.  Patient also reported subjective fever/chills and no feeling well.  Patient was seen by his podiatrist about 10-12 days ago and had remove a corn on his right fourth toe. Patient was using neosporin and wearing band-aids.  On the day of admission patient noticed increase inflammation and drainage out of his foot and decided to seek medical attention.  MRI of the right foot shows 1. Diffuse cellulitis and myofasciitis. 2. Fluid collections along the dorsum of the forefoot along the fourth digit could be small abscesses. 3. Osteomyelitis involving the proximal and middle phalanges of the fourth toe.  Orthopedics consulted and he underwent  Fourth and fifth ray amputation right foot, VAC applied.  Assessment & Plan:   Principal Problem:   Diabetic foot ulcer ([place]) Active Problems:   S/P BKA (below knee amputation) ([place])   Essential hypertension   Chronic diastolic CHF (congestive heart failure) (HCC)   Diabetes mellitus, type 2 (HCC)   GERD (gastroesophageal reflux disease)   CKD (chronic kidney disease) stage 3, GFR 30-59 ml/min (HCC)   Seizure disorder (HCC)   Diabetic right foot ulcer/osteomyelitis of the fourth toe/absence on the dorsum of the right forefoot Orthopedics consulted andhe underwent  Fourth and fifth ray amputation right foot, VAC applied.   On  vancomycin and cefepime, flagyl, completed 5  Days of antibiotics. Cultures grew MRSA, stop flagyl and cefepime and continue with vancomycin.  Pain control. Physical therapy recommending SNF vs HH PT  depending on progress.  Orthopedics to evaluate in OR on Wednesday.  Right foot WB as tolerated with Darco shoe.     Acute on chronic kidney disease stage III Probably secondary to ATN. FeNa is 2. Creatinine IMRPOVING. .  [place] does not show any hydronephrosis.       Chronic diastolic heart failure Compensated.   Type 2 diabetes mellitus  CBG (last 3)  Recent Labs    09/06/17 0740 09/06/17 1128 09/06/17 1732  GLUCAP 177* 145* 145*    Increase lantus to 15 units daily.  Resume SSI.  No change.    Hypertension well-controlled. No change in meds.    Leukocytosis secondary to Diabetic foot ulcer infection.continues to improve.   Anemia of chronic disease:  Hemoglobin stable around 8.6. Anemia panel shows low iron. Iron supplementation ondischarge.    DVT prophylaxis: Subcu heparin  code Status: Full code Family Communication: None at bedside, discussed the plan with the patient. Disposition Plan: SNF in 2 days after ortho clears him.    Consultants:   Orthopedics.    Procedures: MRI of the foot.   Antimicrobials:vancomycin, cefepime, flagyl for 5 days followed by vancomycin.   Subjective: No new complaints.   Objective: Vitals:   09/05/17 1427 09/05/17 2040 09/06/17 0526 09/06/17 1415  BP: 138/70 (!) 150/70 (!) 165/75 (!) 142/75  Pulse: 74 77 77 74  Resp: 16 16 16 16   Temp: 99.1 F (37.3 C) 99.7 F (37.6 C) 99.2 F (37.3 C) 98.7 F (37.1 C)  TempSrc: Oral Oral Oral Oral  SpO2: 96% 97% 96% 95%  Weight:   111 kg (244 lb 11.4  oz)   Height:        Intake/Output Summary (Last 24 hours) at 09/06/2017 1855 Last data filed at 09/06/2017 0200 Gross per 24 hour  Intake 2550 ml  Output 430 ml  Net 2120 ml   Filed Weights   09/04/17 0445 09/05/17 0509 09/06/17 0526  Weight: 107.9 kg (237 lb 14 oz) 106.9 kg (235 lb 10.8 oz) 111 kg (244 lb 11.4 oz)    Examination:no change from yesterday.   General exam: Appears comfortable. Not in distress.    Respiratory system: Air entry bilaterally no wheezing orrhonchi.  Cardiovascular system: S1 & S2 heard, RRR. No JVD, . Gastrointestinal system: Abdomen is soft, nontender, nondistended with good bowel sounds Central nervous system: Alert and oriented. No focal neurological deficits. Extremities: right foot bandaged with wound vac on, non tender. WB as tolerated.  Psychiatry:  Mood & affect appropriate.     Data Reviewed: I have personally reviewed following labs and imaging studies  CBC: Recent Labs  Lab 08/31/17 1050  09/01/17 0545 09/02/17 1054 09/03/17 0630 09/04/17 0604 09/06/17 0517  WBC 33.1*   < > 33.4* 33.2* 29.9* 28.8* 25.1*  NEUTROABS 28.1*  --   --  28.2*  --   --   --   HGB 9.5*   < > 8.9* 8.8* 8.7* 8.6* 8.3*  HCT 28.3*   < > 27.1* 27.1* 26.7* 26.3* 25.0*  MCV 89.6   < > 90.3 89.4 89.3 90.1 89.3  PLT 354   < > 362 412* 432* 499* 575*   < > = values in this interval not displayed.   Basic Metabolic Panel: Recent Labs  Lab 08/31/17 1730 09/01/17 0545 09/03/17 0630 09/04/17 0604 09/05/17 0624 09/06/17 0517  NA  --  135 137 135 137 139  K  --  4.2 4.6 4.5 5.0 5.0  CL  --  111 114* 111 115* 116*  CO2  --  17* 19* 18* 17* 18*  GLUCOSE  --  158* 186* 178* 169* 202*  BUN  --  57* 59* 65* 57* 60*  CREATININE 3.50* 3.80* 3.58* 3.60* 3.19* 3.19*  CALCIUM  --  8.4* 8.0* 8.0* 8.2* 8.1*  MG 1.8  --   --   --   --   --   PHOS 3.3  --   --   --   --   --    GFR: Estimated Creatinine Clearance: 30.6 mL/min (A) (by C-G formula based on SCr of 3.19 mg/dL (H)). Liver Function Tests: Recent Labs  Lab 08/31/17 1050  AST 19  ALT 14*  ALKPHOS 106  BILITOT 0.9  PROT 7.8  ALBUMIN 3.0*   No results for input(s): LIPASE, AMYLASE in the last 168 hours. No results for input(s): AMMONIA in the last 168 hours. Coagulation Profile: No results for input(s): INR, PROTIME in the last 168 hours. Cardiac Enzymes: No results for input(s): CKTOTAL, CKMB, CKMBINDEX, TROPONINI  in the last 168 hours. BNP (last 3 results) No results for input(s): PROBNP in the last 8760 hours. HbA1C: No results for input(s): HGBA1C in the last 72 hours. CBG: Recent Labs  Lab 09/05/17 1655 09/05/17 2119 09/06/17 0740 09/06/17 1128 09/06/17 1732  GLUCAP 187* 201* 177* 145* 145*   Lipid Profile: No results for input(s): CHOL, HDL, LDLCALC, TRIG, CHOLHDL, LDLDIRECT in the last 72 hours. Thyroid Function Tests: No results for input(s): TSH, T4TOTAL, FREET4, T3FREE, THYROIDAB in the last 72 hours. Anemia Panel: Recent Labs    09/04/17  0920  VITAMINB12 467  FOLATE 11.9  FERRITIN 377*  TIBC 115*  IRON 16*  RETICCTPCT 0.8   Sepsis Labs: Recent Labs  Lab 08/31/17 1057 08/31/17 1325  LATICACIDVEN 0.79 0.80    Recent Results (from the past 240 hour(s))  Culture, blood (routine x 2)     Status: None   Collection Time: 08/31/17  1:10 PM  Result Value Ref Range Status   Specimen Description BLOOD RIGHT ANTECUBITAL  Final   Special Requests   Final    BOTTLES DRAWN AEROBIC AND ANAEROBIC Blood Culture adequate volume   Culture   Final    NO GROWTH 5 DAYS Performed at [facility], [place] [address] [place], [place] [address]    Report Status 09/05/2017 FINAL  Final  Culture, blood (routine x 2)     Status: None   Collection Time: 08/31/17  1:15 PM  Result Value Ref Range Status   Specimen Description BLOOD LEFT HAND  Final   Special Requests IN PEDIATRIC BOTTLE Blood Culture adequate volume  Final   Culture   Final    NO GROWTH 5 DAYS Performed at [facility], [place] [address], [place], [place] [address]    Report Status 09/05/2017 FINAL  Final  Blood Cultures x 2 sites     Status: None   Collection Time: 08/31/17  5:30 PM  Result Value Ref Range Status   Specimen Description BLOOD LEFT ARM  Final   Special Requests   Final    BOTTLES DRAWN AEROBIC ONLY Blood Culture adequate volume   Culture   Final    NO GROWTH 5 DAYS Performed at [facility], [place] [address] [place], [place] [address]    Report Status 09/05/2017 FINAL  Final  Surgical PCR screen     Status: Abnormal   Collection Time: 09/01/17  9:31 PM  Result Value Ref Range Status   MRSA, PCR POSITIVE (A) NEGATIVE Final    Comment: RESULT CALLED TO, READ BACK BY AND VERIFIED WITH: S CORE RN [phone] 01/044/19 A NAVARRO    Staphylococcus aureus POSITIVE (A) NEGATIVE Final    Comment: (NOTE) The Xpert SA Assay (FDA approved for NASAL specimens in patients 69 years of age and older), is one component of a comprehensive surveillance program. It is not intended to diagnose infection nor to guide or monitor treatment.   Aerobic/Anaerobic Culture (surgical/deep wound)     Status: None (Preliminary result)   Collection Time: 09/02/17  6:33 PM  Result Value Ref Range Status   Specimen Description TOE RIGHT  Final   Special Requests NONE  Final   Gram Stain   Final    RARE WBC PRESENT,BOTH PMN AND MONONUCLEAR RARE GRAM POSITIVE COCCI Performed at [facility], [place] [address], [place], [place] [address]    Culture   Final    MODERATE METHICILLIN RESISTANT STAPHYLOCOCCUS AUREUS NO ANAEROBES ISOLATED; CULTURE IN PROGRESS FOR 5 DAYS    Report Status PENDING  Incomplete   Organism ID, Bacteria METHICILLIN RESISTANT STAPHYLOCOCCUS AUREUS  Final      Susceptibility   Methicillin resistant staphylococcus aureus - MIC*    CIPROFLOXACIN >=8 RESISTANT Resistant     ERYTHROMYCIN >=8 RESISTANT Resistant     GENTAMICIN <=0.5 SENSITIVE Sensitive     OXACILLIN >=4 RESISTANT Resistant     TETRACYCLINE <=1 SENSITIVE Sensitive     VANCOMYCIN 1 SENSITIVE Sensitive     TRIMETH/SULFA <=10 SENSITIVE Sensitive  CLINDAMYCIN <=0.25 SENSITIVE Sensitive     RIFAMPIN <=0.5 SENSITIVE Sensitive     Inducible Clindamycin NEGATIVE Sensitive     * MODERATE METHICILLIN RESISTANT STAPHYLOCOCCUS AUREUS         Radiology Studies: No results found.      Scheduled Meds: .  amLODipine  10 mg Oral Daily  . aspirin EC  81 mg Oral Daily  . atorvastatin  20 mg Oral Daily  . carvedilol  12.5 mg Oral BID WC  . insulin aspart  0-15 Units Subcutaneous TID WC  . insulin aspart  0-5 Units Subcutaneous QHS  . insulin glargine  15 Units Subcutaneous Daily  . lacosamide  200 mg Oral BID  . multivitamin with minerals  1 tablet Oral Daily  . mupirocin ointment   Nasal BID  . protein supplement shake  11 oz Oral BID BM   Continuous Infusions: . vancomycin 1,250 mg (09/06/17 1747)     LOS: 6 days    Time spent: 35 minutes    Hosie Poisson, MD [facility] Pager [phone] If 7PM-7AM, please contact night-coverage [email or web address] Password [facility] 09/06/2017, 6:55 PM

## 2017-09-06 NOTE — Progress Notes (Signed)
Pharmacy Antibiotic Note  Mark [name] is a 67 y.o. male admitted on 08/31/2017 with possible diabetic foot infection and osteomyelitis.  Pharmacy has been consulted for vancomycin and cefepime dosing; MD dosing flagyl.  Day #7 antibiotics  afebrile  Renal: CKD - SCr = 3.19 (improved from previous values)  CBC: remains elevated but improving  ABI - mild arterial dz RLE  1/4 Ortho 4th/5th ray amputation  OR culture: MRSA  Based on vancomycin levels: AUC = 537, ke 0.0236  Plan:  Continue vancomycin 1250mg  IV q48h   Dose appropriate per AUC calculation  F/u stop cefepime and metronidazole  Height: 6\' 2"  (188 cm) Weight: 244 lb 11.4 oz (111 kg) IBW/kg (Calculated) : 82.2  Temp (24hrs), Avg:99.3 F (37.4 C), Min:99.1 F (37.3 C), Max:99.7 F (37.6 C)  Recent Labs  Lab 08/31/17 1057 08/31/17 1325  09/01/17 0545 09/02/17 1054 09/03/17 0630 09/04/17 0604 09/04/17 2240 09/05/17 0624 09/06/17 0517  WBC  --   --    < > 33.4* 33.2* 29.9* 28.8*  --   --  25.1*  CREATININE  --   --    < > 3.80*  --  3.58* 3.60*  --  3.19* 3.19*  LATICACIDVEN 0.79 0.80  --   --   --   --   --   --   --   --   VANCOTROUGH  --   --   --   --   --   --   --   --   --  17  VANCOPEAK  --   --   --   --   --   --   --  35  --   --    < > = values in this interval not displayed.    Estimated Creatinine Clearance: 30.6 mL/min (A) (by C-G formula based on SCr of 3.19 mg/dL (H)).    No Known Allergies  Antimicrobials this admission:  1/2 Vancomycin >> 1/2 Cefepime >> 1/2 Flagyl >> Dose adjustments this admission:  1/6 2240 VPk = 35 (dose 1250gm IV q48h - dose 1/6 at 19:06) Microbiology results:  1/2 BCx x 3: NGTD 1/3 MRSA PCR: Positive 1/4 R toe (doesn't specify if soft tissue, bone, or other fluid): MRSA  Thank you for allowing pharmacy to be a part of this patient's care.  Doreene Eland, PharmD, BCPS.   Pager: [phone] 09/06/2017 7:31 AM

## 2017-09-07 LAB — BASIC METABOLIC PANEL
Anion gap: 5 (ref 5–15)
BUN: 51 mg/dL — ABNORMAL HIGH (ref 6–20)
CO2: 21 mmol/L — ABNORMAL LOW (ref 22–32)
Calcium: 8.6 mg/dL — ABNORMAL LOW (ref 8.9–10.3)
Chloride: 115 mmol/L — ABNORMAL HIGH (ref 101–111)
Creatinine, Ser: 2.77 mg/dL — ABNORMAL HIGH (ref 0.61–1.24)
GFR calc Af Amer: 26 mL/min — ABNORMAL LOW (ref >60)
GFR calc non Af Amer: 22 mL/min — ABNORMAL LOW (ref >60)
Glucose, Bld: 163 mg/dL — ABNORMAL HIGH (ref 65–99)
Potassium: 4.7 mmol/L (ref 3.5–5.1)
Sodium: 141 mmol/L (ref 135–145)

## 2017-09-07 LAB — GLUCOSE, CAPILLARY
Glucose-Capillary: 141 mg/dL — ABNORMAL HIGH (ref 65–99)
Glucose-Capillary: 203 mg/dL — ABNORMAL HIGH (ref 65–99)
Glucose-Capillary: 137 mg/dL — ABNORMAL HIGH (ref 65–99)
Glucose-Capillary: 171 mg/dL — ABNORMAL HIGH (ref 65–99)

## 2017-09-07 LAB — AEROBIC/ANAEROBIC CULTURE W GRAM STAIN (SURGICAL/DEEP WOUND)

## 2017-09-07 LAB — AEROBIC/ANAEROBIC CULTURE (SURGICAL/DEEP WOUND)

## 2017-09-07 LAB — CBC
HCT: 28.4 % — ABNORMAL LOW (ref 39.0–52.0)
Hemoglobin: 9.3 g/dL — ABNORMAL LOW (ref 13.0–17.0)
MCH: 29.3 pg (ref 26.0–34.0)
MCHC: 32.7 g/dL (ref 30.0–36.0)
MCV: 89.6 fL (ref 78.0–100.0)
Platelets: 705 10*3/uL — ABNORMAL HIGH (ref 150–400)
RBC: 3.17 MIL/uL — ABNORMAL LOW (ref 4.22–5.81)
RDW: 14 % (ref 11.5–15.5)
WBC: 22.4 10*3/uL — ABNORMAL HIGH (ref 4.0–10.5)

## 2017-09-07 MED ORDER — HEPARIN SODIUM (PORCINE) 5000 UNIT/ML IJ SOLN
5000.0000 [IU] | Freq: Three times a day (TID) | INTRAMUSCULAR | Status: DC
Start: 2017-09-07 — End: 2017-09-14
  Administered 2017-09-07 – 2017-09-13 (×19): 5000 [IU] via SUBCUTANEOUS
  Filled 2017-09-07 (×18): qty 1

## 2017-09-07 NOTE — Consult Note (Signed)
[facility] Nurse wound follow up Wound type:Surgical amputation site right foot Measurement:per Monday Wound bed:50% red, 50% with white stringy nonviable slough Drainage (amount, consistency, odor) small amount of serous to light yellow exudate, no odor Periwound:macerated and with peeling of epidermis Dressing procedure/placement/frequency: NPWT dressing removed after patient received premedication. Wound cleansed with NS, and gently patted dry. Two (2) pieces of black foam used to obliterate dead space, a skin barrier ring is used to encircle the wound and enhance drape seal.  Drape is applied to the intact skin on the anterior foot upon which I place a black foam "bridge".  This is covered with drape and the foam is attached to the negative pressure via the T.R.A.C. Pad at 100 mmHg continuous negative pressure. An immediate seal is achieved. A Kerlix roll gauze and an ACE bandage are placed to prevent falling while ambulating and the tubing is padded with several dry gauze 4x4s.  Next dressing change is due on Friday, 09/09/17.  One of my partners will assist in this change if patient is still in house.  [facility] nursing team will follow, and will remain available to this patient, the nursing and medical teams.  Thanks, Maudie Flakes, MSN, RN, [place], Arther Abbott  Pager# [phone]

## 2017-09-07 NOTE — Progress Notes (Signed)
PROGRESS NOTE  Mark [name]  LKG:[id number] DOB: [birth year] DOA: 08/31/2017 PCP: [name], MD   Brief Narrative: Mark [name] is a 67 y.o. male with a history of HTN, T2DM, stage III CKD, chronic HFpEF, PVD s/p left BKA, and seizure disorder who presented to the ED for wound on right foot with fever about 1.5 weeks after having a corn removed from the 4th toe by podiatry as an outpatient. MRI showed diffuse cellulitis, myofasciitis and fluid collections along 4th digit w/associated osteomyelitis. Orthopedics was consulted and he underwent right fourth and fifth ray amputation with VAC application on 02/02/4402 by Dr. Lorin Mercy.   Assessment & Plan: Principal Problem:   Diabetic foot ulcer ([place]) Active Problems:   S/P BKA (below knee amputation) (HCC)   Essential hypertension   Chronic diastolic CHF (congestive heart failure) (HCC)   Diabetes mellitus, type 2 (HCC)   GERD (gastroesophageal reflux disease)   CKD (chronic kidney disease) stage 3, GFR 30-59 ml/min (HCC)   Seizure disorder (HCC)  Diabetic right foot ulcer, MRSA osteomyelitis and abscess of the fourth toe: s/p fourth and fifth ray amputation right foot, VAC applied 1/4 by Dr. Lorin Mercy; culture grew MRSA, blood cultures negative. Leukocytosis is improving.  - Continuing vancomycin, dosed per pharmacy.  - WBAT w/post-op shoe per ortho - Continue wound vac - Pain controlled as ordered.   Acute on chronic kidney disease stage III: Due to ATN (FENa elevated at 2) due to infection. No hydro on U/S.  - Improving. Will monitor and renally dose medications.    T2DM: At inpatient goal. HbA1c 7.1%.  - Continue lantus, SSI   Chronic diastolic heart failure: Euvolemic.  - Continue home medications  Hypertension: well-controlled. - No change in meds.   Anemia of chronic disease: Hgb stable. Anemia panel mixed w/low iron and TIBC with elevated ferritin due to acute phase reactant elevation.  - Would likely benefit from  iron as an outpatient following acute infection.   DVT prophylaxis: Heparin Code Status: Full Family Communication: None at bedside Disposition Plan: Home w/HH once cleared by orthopedics.   Consultants: Orthopedics  Procedures:   1/4 4th/5th ray amputation right foot by Dr. Lorin Mercy  Antimicrobials:  Vancomycin 1/2 >>   Cefepime, flagyl 1/2 - 1/7  Subjective: Feeling less pain, wants to go home. No fevers. Ambulating better.   Objective: Vitals:   09/06/17 1415 09/06/17 2153 09/07/17 0529 09/07/17 1520  BP: (!) 142/75 (!) 168/89 (!) 151/98 (!) 162/76  Pulse: 74 77 78 85  Resp: 16 16 16 16   Temp: 98.7 F (37.1 C) 99.1 F (37.3 C) 98.6 F (37 C) 99.5 F (37.5 C)  TempSrc: Oral  Oral Oral  SpO2: 95% 100% 97% 97%  Weight:   110 kg (242 lb 8.1 oz)   Height:        Intake/Output Summary (Last 24 hours) at 09/07/2017 1626 Last data filed at 09/07/2017 0830 Gross per 24 hour  Intake 240 ml  Output 750 ml  Net -510 ml   Filed Weights   09/05/17 0509 09/06/17 0526 09/07/17 0529  Weight: 106.9 kg (235 lb 10.8 oz) 111 kg (244 lb 11.4 oz) 110 kg (242 lb 8.1 oz)    Gen: 67 y.o. male in no distress Pulm: Non-labored breathing room air. Clear to auscultation bilaterally.  CV: Regular rate and rhythm. No murmur, rub, or gallop. No JVD. GI: Abdomen soft, non-tender, non-distended, with normoactive bowel sounds. No organomegaly or masses felt. Ext: Left BKA, right  foot wrapped in ACE wrap, vac in place with good seal.  Skin: As above. Neuro: Alert and oriented. No focal neurological deficits. Psych: Judgement and insight appear normal. Mood & affect appropriate.   Data Reviewed: I have personally reviewed following labs and imaging studies  CBC: Recent Labs  Lab 09/02/17 1054 09/03/17 0630 09/04/17 0604 09/06/17 0517 09/07/17 0556  WBC 33.2* 29.9* 28.8* 25.1* 22.4*  NEUTROABS 28.2*  --   --   --   --   HGB 8.8* 8.7* 8.6* 8.3* 9.3*  HCT 27.1* 26.7* 26.3* 25.0* 28.4*    MCV 89.4 89.3 90.1 89.3 89.6  PLT 412* 432* 499* 575* 664*   Basic Metabolic Panel: Recent Labs  Lab 08/31/17 1730  09/03/17 0630 09/04/17 0604 09/05/17 0624 09/06/17 0517 09/07/17 0556  NA  --    < > 137 135 137 139 141  K  --    < > 4.6 4.5 5.0 5.0 4.7  CL  --    < > 114* 111 115* 116* 115*  CO2  --    < > 19* 18* 17* 18* 21*  GLUCOSE  --    < > 186* 178* 169* 202* 163*  BUN  --    < > 59* 65* 57* 60* 51*  CREATININE 3.50*   < > 3.58* 3.60* 3.19* 3.19* 2.77*  CALCIUM  --    < > 8.0* 8.0* 8.2* 8.1* 8.6*  MG 1.8  --   --   --   --   --   --   PHOS 3.3  --   --   --   --   --   --    < > = values in this interval not displayed.   GFR: Estimated Creatinine Clearance: 35.1 mL/min (A) (by C-G formula based on SCr of 2.77 mg/dL (H)). Liver Function Tests: No results for input(s): AST, ALT, ALKPHOS, BILITOT, PROT, ALBUMIN in the last 168 hours. No results for input(s): LIPASE, AMYLASE in the last 168 hours. No results for input(s): AMMONIA in the last 168 hours. Coagulation Profile: No results for input(s): INR, PROTIME in the last 168 hours. Cardiac Enzymes: No results for input(s): CKTOTAL, CKMB, CKMBINDEX, TROPONINI in the last 168 hours. BNP (last 3 results) No results for input(s): PROBNP in the last 8760 hours. HbA1C: No results for input(s): HGBA1C in the last 72 hours. CBG: Recent Labs  Lab 09/06/17 1128 09/06/17 1732 09/06/17 2149 09/07/17 0753 09/07/17 1144  GLUCAP 145* 145* 158* 141* 203*   Lipid Profile: No results for input(s): CHOL, HDL, LDLCALC, TRIG, CHOLHDL, LDLDIRECT in the last 72 hours. Thyroid Function Tests: No results for input(s): TSH, T4TOTAL, FREET4, T3FREE, THYROIDAB in the last 72 hours. Anemia Panel: No results for input(s): VITAMINB12, FOLATE, FERRITIN, TIBC, IRON, RETICCTPCT in the last 72 hours. Urine analysis:    Component Value Date/Time   COLORURINE YELLOW 08/31/2017 0818   APPEARANCEUR CLOUDY (A) 08/31/2017 0818   LABSPEC  1.015 08/31/2017 0818   PHURINE 5.0 08/31/2017 0818   GLUCOSEU 50 (A) 08/31/2017 0818   HGBUR SMALL (A) 08/31/2017 0818   BILIRUBINUR NEGATIVE 08/31/2017 0818   KETONESUR NEGATIVE 08/31/2017 0818   PROTEINUR 100 (A) 08/31/2017 0818   UROBILINOGEN 0.2 04/18/2015 1544   NITRITE NEGATIVE 08/31/2017 0818   LEUKOCYTESUR NEGATIVE 08/31/2017 0818   Recent Results (from the past 240 hour(s))  Culture, blood (routine x 2)     Status: None   Collection Time: 08/31/17  1:10 PM  Result Value Ref Range Status   Specimen Description BLOOD RIGHT ANTECUBITAL  Final   Special Requests   Final    BOTTLES DRAWN AEROBIC AND ANAEROBIC Blood Culture adequate volume   Culture   Final    NO GROWTH 5 DAYS Performed at [facility], [address] [place], [place] [address]    Report Status 09/05/2017 FINAL  Final  Culture, blood (routine x 2)     Status: None   Collection Time: 08/31/17  1:15 PM  Result Value Ref Range Status   Specimen Description BLOOD LEFT HAND  Final   Special Requests IN PEDIATRIC BOTTLE Blood Culture adequate volume  Final   Culture   Final    NO GROWTH 5 DAYS Performed at [facility], [place] [address], [place], [place] [address]    Report Status 09/05/2017 FINAL  Final  Blood Cultures x 2 sites     Status: None   Collection Time: 08/31/17  5:30 PM  Result Value Ref Range Status   Specimen Description BLOOD LEFT ARM  Final   Special Requests   Final    BOTTLES DRAWN AEROBIC ONLY Blood Culture adequate volume   Culture   Final    NO GROWTH 5 DAYS Performed at [facility], [place] [address] [place], [place] [address]    Report Status 09/05/2017 FINAL  Final  Surgical PCR screen     Status: Abnormal   Collection Time: 09/01/17  9:31 PM  Result Value Ref Range Status   MRSA, PCR POSITIVE (A) NEGATIVE Final    Comment: RESULT CALLED TO, READ BACK BY AND VERIFIED WITH: S CORE RN [phone] 01/044/19 A NAVARRO    Staphylococcus aureus POSITIVE (A) NEGATIVE Final     Comment: (NOTE) The Xpert SA Assay (FDA approved for NASAL specimens in patients 41 years of age and older), is one component of a comprehensive surveillance program. It is not intended to diagnose infection nor to guide or monitor treatment.   Aerobic/Anaerobic Culture (surgical/deep wound)     Status: None   Collection Time: 09/02/17  6:33 PM  Result Value Ref Range Status   Specimen Description TOE RIGHT  Final   Special Requests NONE  Final   Gram Stain   Final    RARE WBC PRESENT,BOTH PMN AND MONONUCLEAR RARE GRAM POSITIVE COCCI    Culture   Final    MODERATE METHICILLIN RESISTANT STAPHYLOCOCCUS AUREUS NO ANAEROBES ISOLATED Performed at [facility], [place] [address], [place], [place] [address]    Report Status 09/07/2017 FINAL  Final   Organism ID, Bacteria METHICILLIN RESISTANT STAPHYLOCOCCUS AUREUS  Final      Susceptibility   Methicillin resistant staphylococcus aureus - MIC*    CIPROFLOXACIN >=8 RESISTANT Resistant     ERYTHROMYCIN >=8 RESISTANT Resistant     GENTAMICIN <=0.5 SENSITIVE Sensitive     OXACILLIN >=4 RESISTANT Resistant     TETRACYCLINE <=1 SENSITIVE Sensitive     VANCOMYCIN 1 SENSITIVE Sensitive     TRIMETH/SULFA <=10 SENSITIVE Sensitive     CLINDAMYCIN <=0.25 SENSITIVE Sensitive     RIFAMPIN <=0.5 SENSITIVE Sensitive     Inducible Clindamycin NEGATIVE Sensitive     * MODERATE METHICILLIN RESISTANT STAPHYLOCOCCUS AUREUS      Radiology Studies: No results found.  Scheduled Meds: . amLODipine  10 mg Oral Daily  . aspirin EC  81 mg Oral Daily  . atorvastatin  20 mg Oral Daily  . carvedilol  12.5 mg Oral BID WC  . heparin injection (subcutaneous)  5,000 Units Subcutaneous Q8H  . insulin aspart  0-15 Units Subcutaneous TID WC  . insulin aspart  0-5 Units Subcutaneous QHS  . insulin glargine  15 Units Subcutaneous Daily  . lacosamide  200 mg Oral BID  . multivitamin with minerals  1 tablet Oral Daily  . mupirocin ointment   Nasal BID  .  protein supplement shake  11 oz Oral BID BM   Continuous Infusions: . vancomycin 1,250 mg (09/06/17 1747)     LOS: 7 days   Time spent: 25 minutes.  Vance Gather, MD [facility] Pager [phone]  If 7PM-7AM, please contact night-coverage [email or web address] Password [facility] 09/07/2017, 4:26 PM

## 2017-09-07 NOTE — Progress Notes (Signed)
Nutrition Follow-up  DOCUMENTATION CODES:   Obesity unspecified  INTERVENTION:   Provide Premier Protein BID, each supplement provides 160kcal and 30g protein.   NUTRITION DIAGNOSIS:   Increased nutrient needs related to wound healing as evidenced by estimated needs.  Ongoing  GOAL:   Patient will meet greater than or equal to 90% of their needs  Meeting  MONITOR:   PO intake, Supplement acceptance, Weight trends, Labs  REASON FOR ASSESSMENT:   Consult Wound healing  ASSESSMENT:   Pt with PMH significant for HTN, DM, CKD III, seizures, left BKA, and CHF. Presents this admission sepsis secondary to right diabetic foot ulcer.    1/4- fourth and fifth ray amputation right foot with wound vac placement.   Pt states appetite remains stable. Meal completions charted as 100% for last eight meals. Pt is drinking one premier protein per day. Wound vac continues to 4-5th right toes. Eager to be discharged. Plan is for pt to go to SNF 1/10. Will continue with current nutrition intervention.   Medications reviewed and include: SSI, Lantus, MVI with minerals, IV abx Labs reviewed: BUN 51 (H) Creatinine 2.77 (H)  Diet Order:  Diet heart healthy/carb modified Room service appropriate? Yes; Fluid consistency: Thin  EDUCATION NEEDS:   Education needs have been addressed  Skin:  Skin Assessment: Skin Integrity Issues: Skin Integrity Issues:: Diabetic Ulcer Diabetic Ulcer: right foot   Last BM:  09/04/17  Height:   Ht Readings from Last 1 Encounters:  08/31/17 6\' 2"  (1.88 m)    Weight:   Wt Readings from Last 1 Encounters:  09/07/17 242 lb 8.1 oz (110 kg)    Ideal Body Weight:  81.4 kg(adjusted BKA)  BMI:  Body mass index is 31.14 kg/m.  Estimated Nutritional Needs:   Kcal:  2200-2400 kcal/day  Protein:  110-120 g/day  Fluid:  >2.2 L/day    Mariana Single RD, LDN Clinical Nutrition Pager # - [phone]

## 2017-09-07 NOTE — Progress Notes (Signed)
Physical Therapy Treatment Patient Details Name: Mark [name] MRN: [id number] DOB: 1951-01-08 Today's Date: 09/07/2017    History of Present Illness 67 year old male with a past medical history significant for hypertension, diabetes, chronic kidney disease is stage III, seizures, left BKA and chronic diastolic heart failure; who presented to the emergency department secondary to right foot redness, swelling and drainage. Dx of osteomyelitis, s/p 4-5th ray amputations    PT Comments    Pt able to don prosthesis today. He ambulated 80' with RW and min assist. Encouraged pt to perform ankle pumps on R to reduce edema in foot.  Follow Up Recommendations  Home health PT     Equipment Recommendations  None recommended by PT    Recommendations for Other Services       Precautions / Restrictions Precautions Precautions: Fall Precaution Comments: 1 fall in past year Restrictions Weight Bearing Restrictions: No Other Position/Activity Restrictions: WBAT with Darco shoe RLE    Mobility  Bed Mobility               General bed mobility comments: sitting up on EOB  Transfers Overall transfer level: Needs assistance Equipment used: Rolling walker (2 wheeled) Transfers: Sit to/from Stand Sit to Stand: From elevated surface;Min guard         General transfer comment: VCs hand placement  Ambulation/Gait Ambulation/Gait assistance: Min assist Ambulation Distance (Feet): 75 Feet Assistive device: Rolling walker (2 wheeled) Gait Pattern/deviations: Step-to pattern;Trunk flexed   Gait velocity interpretation: Below normal speed for age/gender General Gait Details: darco shoe on R foot, heavy use of BUEs, distance limited by fatigue, trunk progressively more flexed with fatigue, VCs for posture and to stay closer to [employer]    Modified Rankin (Stroke Patients Only)       Balance Overall balance assessment: Needs  assistance;History of Falls   Sitting balance-Leahy Scale: Good       Standing balance-Leahy Scale: Poor Standing balance comment: relies on BUE support                            Cognition Arousal/Alertness: Awake/alert Behavior During Therapy: WFL for tasks assessed/performed Overall Cognitive Status: Within Functional Limits for tasks assessed                                        Exercises General Exercises - Lower Extremity Ankle Circles/Pumps: AROM;Right;10 reps;Supine    General Comments        Pertinent Vitals/Pain Pain Assessment: No/denies pain    Home Living                      Prior Function            PT Goals (current goals can now be found in the care plan section) Acute Rehab PT Goals Patient Stated Goal: to be able to walk PT Goal Formulation: With patient/family Time For Goal Achievement: 09/19/17 Potential to Achieve Goals: Good Progress towards PT goals: Progressing toward goals    Frequency    Min 3X/week      PT Plan Current plan remains appropriate    Co-evaluation              AM-PAC PT "6 Clicks" Daily Activity  Outcome Measure  Difficulty turning over in bed (including adjusting bedclothes, sheets and blankets)?: A Little Difficulty moving from lying on back to sitting on the side of the bed? : A Lot Difficulty sitting down on and standing up from a chair with arms (e.g., wheelchair, bedside commode, etc,.)?: A Little Help needed moving to and from a bed to chair (including a wheelchair)?: A Little Help needed walking in hospital room?: A Little Help needed climbing 3-5 steps with a railing? : A Lot 6 Click Score: 16    End of Session Equipment Utilized During Treatment: Gait belt Activity Tolerance: Patient tolerated treatment well Patient left: in chair;with family/visitor present;with call bell/phone within reach(sitting on EOB) Nurse Communication: Mobility status PT Visit  Diagnosis: Difficulty in walking, not elsewhere classified (R26.2)     Time: 1364-3837 PT Time Calculation (min) (ACUTE ONLY): 21 min  Charges:  $Gait Training: 8-22 mins                    G Codes:          Philomena Doheny 09/07/2017, 10:52 AM [phone]

## 2017-09-07 NOTE — Clinical Social Work Note (Signed)
Clinical Social Work Assessment  Patient Details  Name: Mark [name] MRN: [id number] Date of Birth: 1950-11-10  Date of referral:  09/07/17               Reason for consult:  Facility Placement, Discharge Planning                Permission sought to share information with:    Permission granted to share information::  No  Name::        Agency::     Relationship::     Contact Information:     Housing/Transportation Living arrangements for the past 2 months:  Single Family Home Source of Information:  Patient Patient Interpreter Needed:  None Criminal Activity/Legal Involvement Pertinent to Current Situation/Hospitalization:  No - Comment as needed Significant Relationships:  Spouse Lives with:  Spouse Do you feel safe going back to the place where you live?  Yes Need for family participation in patient care:  Yes (Comment)  Care giving concerns:  No care giving concerns at the time of assessment.    Social Worker assessment / plan:  LCSW consulted for SNF placement.  Patient admitted for Diabetic foot ulcer.  Patient is a below the knee amputee and has prosthetic.   LCSW met with patient at bed side. No family present.   Patient reports that he lives at home with his wife. Patient prefers to go home with home health.   Patient has complaints about fit of  his prosthetic.   PLAN: Patient will go home with home health.  Employment status:  Retired Nurse, adult PT Recommendations:  [place], Home with [place] / Referral to community resources:  [place]  Patient/Family's Response to care:  Patient has been proactive in his care and following up with his need with his prosthetic. Patient is satisfied with care. Patient is thankful of LCSW visit and services.   Patient/Family's Understanding of and Emotional Response to Diagnosis, Current Treatment, and Prognosis:  Patient is understanding of his  current diagnosis and agreeable to treatment plan.   Emotional Assessment Appearance:  Appears older than stated age Attitude/Demeanor/Rapport:    Affect (typically observed):  Calm Orientation:  Oriented to Place, Oriented to  Time, Oriented to Situation, Oriented to Self Alcohol / Substance use:  Not Applicable Psych involvement (Current and /or in the community):  No (Comment)  Discharge Needs  Concerns to be addressed:  No discharge needs identified Readmission within the last 30 days:  No Current discharge risk:  None Barriers to Discharge:  Continued Medical Work up   [employer], LCSW 09/07/2017, 11:43 AM

## 2017-09-08 ENCOUNTER — Inpatient Hospital Stay (HOSPITAL_COMMUNITY): Payer: Medicare Other | Admitting: Anesthesiology

## 2017-09-08 ENCOUNTER — Encounter (HOSPITAL_COMMUNITY)
Admission: EM | Disposition: A | Discharge status: Skilled Nursing Facility | Payer: Self-pay | Source: Home / Self Care | Attending: Family Medicine

## 2017-09-08 DIAGNOSIS — E1142 Type 2 diabetes mellitus with diabetic polyneuropathy: Secondary | ICD-10-CM

## 2017-09-08 DIAGNOSIS — M86171 Other acute osteomyelitis, right ankle and foot: Secondary | ICD-10-CM

## 2017-09-08 HISTORY — PX: I & D EXTREMITY: SHX5045

## 2017-09-08 LAB — GLUCOSE, CAPILLARY
Glucose-Capillary: 132 mg/dL — ABNORMAL HIGH (ref 65–99)
Glucose-Capillary: 185 mg/dL — ABNORMAL HIGH (ref 65–99)
Glucose-Capillary: 114 mg/dL — ABNORMAL HIGH (ref 65–99)
Glucose-Capillary: 125 mg/dL — ABNORMAL HIGH (ref 65–99)

## 2017-09-08 LAB — CBC
HCT: 27.7 % — ABNORMAL LOW (ref 39.0–52.0)
Hemoglobin: 9.1 g/dL — ABNORMAL LOW (ref 13.0–17.0)
MCH: 29.4 pg (ref 26.0–34.0)
MCHC: 32.9 g/dL (ref 30.0–36.0)
MCV: 89.4 fL (ref 78.0–100.0)
Platelets: 714 10*3/uL — ABNORMAL HIGH (ref 150–400)
RBC: 3.1 MIL/uL — ABNORMAL LOW (ref 4.22–5.81)
RDW: 14.1 % (ref 11.5–15.5)
WBC: 21.7 10*3/uL — ABNORMAL HIGH (ref 4.0–10.5)

## 2017-09-08 LAB — BASIC METABOLIC PANEL
Anion gap: 7 (ref 5–15)
BUN: 54 mg/dL — ABNORMAL HIGH (ref 6–20)
CO2: 19 mmol/L — ABNORMAL LOW (ref 22–32)
Calcium: 8.4 mg/dL — ABNORMAL LOW (ref 8.9–10.3)
Chloride: 113 mmol/L — ABNORMAL HIGH (ref 101–111)
Creatinine, Ser: 2.82 mg/dL — ABNORMAL HIGH (ref 0.61–1.24)
GFR calc Af Amer: 25 mL/min — ABNORMAL LOW (ref >60)
GFR calc non Af Amer: 22 mL/min — ABNORMAL LOW (ref >60)
Glucose, Bld: 147 mg/dL — ABNORMAL HIGH (ref 65–99)
Potassium: 4.8 mmol/L (ref 3.5–5.1)
Sodium: 139 mmol/L (ref 135–145)

## 2017-09-08 SURGERY — IRRIGATION AND DEBRIDEMENT EXTREMITY
Anesthesia: General | Laterality: Right

## 2017-09-08 MED ORDER — SODIUM CHLORIDE 0.9 % IV SOLN: INTRAVENOUS | Status: DC

## 2017-09-08 MED ORDER — FENTANYL CITRATE (PF) 100 MCG/2ML IJ SOLN: 25.0000 ug | INTRAMUSCULAR | Status: DC | PRN

## 2017-09-08 MED ORDER — LIDOCAINE 2% (20 MG/ML) 5 ML SYRINGE
INTRAMUSCULAR | Status: AC
  Filled 2017-09-08: qty 5

## 2017-09-08 MED ORDER — PROPOFOL 10 MG/ML IV BOLUS
INTRAVENOUS | Status: AC
  Filled 2017-09-08: qty 20

## 2017-09-08 MED ORDER — SUCCINYLCHOLINE CHLORIDE 200 MG/10ML IV SOSY
PREFILLED_SYRINGE | INTRAVENOUS | Status: AC
  Filled 2017-09-08: qty 10

## 2017-09-08 MED ORDER — ONDANSETRON HCL 4 MG/2ML IJ SOLN
INTRAMUSCULAR | Status: DC | PRN
  Administered 2017-09-08: 4 mg via INTRAVENOUS

## 2017-09-08 MED ORDER — ONDANSETRON HCL 4 MG/2ML IJ SOLN: 4.0000 mg | Freq: Once | INTRAMUSCULAR | Status: DC | PRN

## 2017-09-08 MED ORDER — ROCURONIUM BROMIDE 50 MG/5ML IV SOSY
PREFILLED_SYRINGE | INTRAVENOUS | Status: AC
  Filled 2017-09-08: qty 5

## 2017-09-08 MED ORDER — LACTATED RINGERS IV SOLN: INTRAVENOUS | Status: DC

## 2017-09-08 MED ORDER — LACTATED RINGERS IV SOLN
INTRAVENOUS | Status: DC | PRN
  Administered 2017-09-08: 20:00:00 via INTRAVENOUS

## 2017-09-08 MED ORDER — FENTANYL CITRATE (PF) 100 MCG/2ML IJ SOLN
INTRAMUSCULAR | Status: AC
  Filled 2017-09-08: qty 2

## 2017-09-08 SURGICAL SUPPLY — 37 items
BANDAGE ACE 4X5 VEL STRL LF (GAUZE/BANDAGES/DRESSINGS) IMPLANT
BANDAGE ESMARK 6X9 LF (GAUZE/BANDAGES/DRESSINGS) IMPLANT
BNDG CMPR 9X6 STRL LF SNTH (GAUZE/BANDAGES/DRESSINGS) ×1
BNDG ESMARK 6X9 LF (GAUZE/BANDAGES/DRESSINGS) ×2
BNDG GAUZE ELAST 4 BULKY (GAUZE/BANDAGES/DRESSINGS) IMPLANT
COVER SURGICAL LIGHT HANDLE (MISCELLANEOUS) ×2 IMPLANT
CUFF TOURN SGL QUICK 18 (TOURNIQUET CUFF) IMPLANT
CUFF TOURN SGL QUICK 24 (TOURNIQUET CUFF)
CUFF TOURN SGL QUICK 34 (TOURNIQUET CUFF)
CUFF TRNQT CYL 24X4X40X1 (TOURNIQUET CUFF) IMPLANT
CUFF TRNQT CYL 34X4X40X1 (TOURNIQUET CUFF) IMPLANT
DRAIN PENROSE 18X1/2 LTX STRL (DRAIN) IMPLANT
DRAPE INCISE IOBAN 66X45 STRL (DRAPES) ×1 IMPLANT
DRAPE U-SHAPE 47X51 STRL (DRAPES) ×1 IMPLANT
DRSG PAD ABDOMINAL 8X10 ST (GAUZE/BANDAGES/DRESSINGS) ×2 IMPLANT
DURAPREP 26ML APPLICATOR (WOUND CARE) ×2 IMPLANT
ELECT REM PT RETURN 15FT ADLT (MISCELLANEOUS) ×2 IMPLANT
EVACUATOR 1/8 PVC DRAIN (DRAIN) IMPLANT
GAUZE SPONGE 4X4 12PLY STRL (GAUZE/BANDAGES/DRESSINGS) IMPLANT
GAUZE XEROFORM 5X9 LF (GAUZE/BANDAGES/DRESSINGS) IMPLANT
GLOVE BIOGEL PI IND STRL 8 (GLOVE) ×1 IMPLANT
GLOVE BIOGEL PI INDICATOR 8 (GLOVE) ×1
GLOVE ORTHO TXT STRL SZ7.5 (GLOVE) ×5 IMPLANT
GOWN STRL REUS W/TWL LRG LVL3 (GOWN DISPOSABLE) ×3 IMPLANT
GOWN STRL REUS W/TWL XL LVL3 (GOWN DISPOSABLE) ×1 IMPLANT
HANDPIECE INTERPULSE COAX TIP (DISPOSABLE)
KIT BASIN OR (CUSTOM PROCEDURE TRAY) ×1 IMPLANT
PACK ORTHO EXTREMITY (CUSTOM PROCEDURE TRAY) ×2 IMPLANT
PAD CAST 4YDX4 CTTN HI CHSV (CAST SUPPLIES) IMPLANT
PADDING CAST COTTON 4X4 STRL (CAST SUPPLIES)
POSITIONER SURGICAL ARM (MISCELLANEOUS) IMPLANT
SET HNDPC FAN SPRY TIP SCT (DISPOSABLE) IMPLANT
SPONGE LAP 18X18 X RAY DECT (DISPOSABLE) IMPLANT
SUT ETHILON 2 0 PS N (SUTURE) ×2 IMPLANT
SYR CONTROL 10ML LL (SYRINGE) IMPLANT
TOWEL OR 17X26 10 PK STRL BLUE (TOWEL DISPOSABLE) ×2 IMPLANT
TOWEL OR NON WOVEN STRL DISP B (DISPOSABLE) ×1 IMPLANT

## 2017-09-08 NOTE — Progress Notes (Signed)
   Subjective: 6 Days Post-Op Procedure(s) (LRB): AMPUTATION RAY (Right) Patient reports pain as 0 on 0-10 scale.  Reports no pain with VAC change likely from neuropathy.   50% white stringy nonviable slough noted on VAC change.   Objective: Vital signs in last 24 hours: Temp:  [98.4 F (36.9 C)-99.5 F (37.5 C)] 98.4 F (36.9 C) (01/10 0440) Pulse Rate:  [75-85] 75 (01/10 0440) Resp:  [16] 16 (01/10 0440) BP: (162)/(76-77) 162/77 (01/10 0440) SpO2:  [96 %-97 %] 96 % (01/10 0440) Weight:  [242 lb 4.6 oz (109.9 kg)] 242 lb 4.6 oz (109.9 kg) (01/10 0440)  Intake/Output from previous day: 01/09 0701 - 01/10 0700 In: 740 [P.O.:240; IV Piggyback:500] Out: 1000 [Urine:950; Drains:50] Intake/Output this shift: No intake/output data recorded.  Recent Labs    09/06/17 0517 09/07/17 0556 09/08/17 0535  HGB 8.3* 9.3* 9.1*   Recent Labs    09/07/17 0556 09/08/17 0535  WBC 22.4* 21.7*  RBC 3.17* 3.10*  HCT 28.4* 27.7*  PLT 705* 714*   Recent Labs    09/07/17 0556 09/08/17 0535  NA 141 139  K 4.7 4.8  CL 115* 113*  CO2 21* 19*  BUN 51* 54*  CREATININE 2.77* 2.82*  GLUCOSE 163* 147*  CALCIUM 8.6* 8.4*   No results for input(s): LABPT, INR in the last 72 hours.  see above for [facility] nurse report on wound. 50% white stringy nonviable slough No results found.  Assessment/Plan: 6 Days Post-Op Procedure(s) (LRB): AMPUTATION RAY (Right) Plan:   NPO , had breakfast. No pain with VAC changes and will take back to OR this evening at about 7:30PM and debride wound sharply .May be able to do just with IV sedation.  WBC slowly coming down but needs nonviable tissue removed . Will reapply VAC after debridement. Discussed with patient and he agrees to proceed.   [name] 09/08/2017, 8:07 AM

## 2017-09-08 NOTE — Progress Notes (Signed)
Date:  September 08, 2017 Chart reviewed for concurrent status and case management needs.  Will continue to follow patient progress. Planned debridement of area tonight [phone] in or. Discharge Planning: following for needs.  None present at this time of review. Expected discharge date: September 11 2017 Velva Harman, BSN, [place], [place]

## 2017-09-08 NOTE — Telephone Encounter (Signed)
Patient's wife spoke with Christina yesterday. I am not sure how OP notes are transferred to My Chart. I advised she could speak with Medical Records at the hospital or that she would have to have husband sign release and we could provide her with copy.

## 2017-09-08 NOTE — Anesthesia Preprocedure Evaluation (Addendum)
Anesthesia Evaluation  Patient identified by MRN, date of birth, ID band Patient awake    Reviewed: Allergy & Precautions, NPO status , Patient's Chart, lab work & pertinent test results  Airway Mallampati: II  TM Distance: >3 FB Neck ROM: Full    Dental  (+) Dental Advisory Given   Pulmonary former smoker,    breath sounds clear to auscultation       Cardiovascular hypertension, Pt. on medications and Pt. on home beta blockers + Peripheral Vascular Disease and +CHF   Rhythm:Regular Rate:Normal     Neuro/Psych Seizures -,     GI/Hepatic GERD  ,(+)     substance abuse  cocaine use,   Endo/Other  diabetes, Type 2, Insulin Dependent  Renal/GU CRFRenal disease     Musculoskeletal   Abdominal   Peds  Hematology  (+) anemia ,   Anesthesia Other Findings   Reproductive/Obstetrics                             Lab Results  Component Value Date   WBC 21.7 (H) 09/08/2017   HGB 9.1 (L) 09/08/2017   HCT 27.7 (L) 09/08/2017   MCV 89.4 09/08/2017   PLT 714 (H) 09/08/2017   Lab Results  Component Value Date   CREATININE 2.82 (H) 09/08/2017   BUN 54 (H) 09/08/2017   NA 139 09/08/2017   K 4.8 09/08/2017   CL 113 (H) 09/08/2017   CO2 19 (L) 09/08/2017    Anesthesia Physical Anesthesia Plan  ASA: III and emergent  Anesthesia Plan: General   Post-op Pain Management:    Induction: Intravenous  PONV Risk Score and Plan: 2 and Ondansetron, Midazolam and Treatment may vary due to age or medical condition  Airway Management Planned: LMA  Additional Equipment:   Intra-op Plan:   Post-operative Plan: Extubation in OR  Informed Consent: I have reviewed the patients History and Physical, chart, labs and discussed the procedure including the risks, benefits and alternatives for the proposed anesthesia with the patient or authorized representative who has indicated his/her understanding and  acceptance.   Dental advisory given  Plan Discussed with: CRNA  Anesthesia Plan Comments:        Anesthesia Quick Evaluation

## 2017-09-08 NOTE — Anesthesia Procedure Notes (Signed)
Procedure Name: LMA Insertion Date/Time: 09/08/2017 8:10 PM Performed by: Lind Covert, CRNA Pre-anesthesia Checklist: Patient identified, Emergency Drugs available, Suction available and Patient being monitored Patient Re-evaluated:Patient Re-evaluated prior to induction Oxygen Delivery Method: Circle system utilized Preoxygenation: Pre-oxygenation with 100% oxygen Induction Type: IV induction LMA: LMA inserted Number of attempts: 1 Placement Confirmation: positive ETCO2 and breath sounds checked- equal and bilateral Tube secured with: Tape Dental Injury: Teeth and Oropharynx as per pre-operative assessment

## 2017-09-08 NOTE — Anesthesia Postprocedure Evaluation (Signed)
Anesthesia Post Note  Patient: Mark [name]  Procedure(s) Performed: DEBRIDEMENT RIGHT FOOT AND WOUND VAC CHANGE (Right )     Patient location during evaluation: PACU Anesthesia Type: General Level of consciousness: awake and alert Pain management: pain level controlled Vital Signs Assessment: post-procedure vital signs reviewed and stable Respiratory status: spontaneous breathing, nonlabored ventilation, respiratory function stable and patient connected to nasal cannula oxygen Cardiovascular status: blood pressure returned to baseline and stable Postop Assessment: no apparent nausea or vomiting Anesthetic complications: no    Last Vitals:  Vitals:   09/08/17 2111 09/08/17 2127  BP: (!) 144/84 (!) 154/74  Pulse: 78 77  Resp: 18 (!) 26  Temp: 36.8 C 36.7 C  SpO2: 96% 95%    Last Pain:  Vitals:   09/08/17 1859  TempSrc: Oral  PainSc:                  Tiajuana Amass

## 2017-09-08 NOTE — H&P (View-Only) (Signed)
   Subjective: 6 Days Post-Op Procedure(s) (LRB): AMPUTATION RAY (Right) Patient reports pain as 0 on 0-10 scale.  Reports no pain with VAC change likely from neuropathy.   50% white stringy nonviable slough noted on VAC change.   Objective: Vital signs in last 24 hours: Temp:  [98.4 F (36.9 C)-99.5 F (37.5 C)] 98.4 F (36.9 C) (01/10 0440) Pulse Rate:  [75-85] 75 (01/10 0440) Resp:  [16] 16 (01/10 0440) BP: (162)/(76-77) 162/77 (01/10 0440) SpO2:  [96 %-97 %] 96 % (01/10 0440) Weight:  [242 lb 4.6 oz (109.9 kg)] 242 lb 4.6 oz (109.9 kg) (01/10 0440)  Intake/Output from previous day: 01/09 0701 - 01/10 0700 In: 740 [P.O.:240; IV Piggyback:500] Out: 1000 [Urine:950; Drains:50] Intake/Output this shift: No intake/output data recorded.  Recent Labs    09/06/17 0517 09/07/17 0556 09/08/17 0535  HGB 8.3* 9.3* 9.1*   Recent Labs    09/07/17 0556 09/08/17 0535  WBC 22.4* 21.7*  RBC 3.17* 3.10*  HCT 28.4* 27.7*  PLT 705* 714*   Recent Labs    09/07/17 0556 09/08/17 0535  NA 141 139  K 4.7 4.8  CL 115* 113*  CO2 21* 19*  BUN 51* 54*  CREATININE 2.77* 2.82*  GLUCOSE 163* 147*  CALCIUM 8.6* 8.4*   No results for input(s): LABPT, INR in the last 72 hours.  see above for [facility] nurse report on wound. 50% white stringy nonviable slough No results found.  Assessment/Plan: 6 Days Post-Op Procedure(s) (LRB): AMPUTATION RAY (Right) Plan:   NPO , had breakfast. No pain with VAC changes and will take back to OR this evening at about 7:30PM and debride wound sharply .May be able to do just with IV sedation.  WBC slowly coming down but needs nonviable tissue removed . Will reapply VAC after debridement. Discussed with patient and he agrees to proceed.   [name] 09/08/2017, 8:07 AM

## 2017-09-08 NOTE — Progress Notes (Signed)
PROGRESS NOTE  Mark [name]  XAJ:[id number] DOB: June 06, 1951 DOA: 08/31/2017 PCP: [name], MD   Brief Narrative: Mark [name] is a 67 y.o. male with a history of HTN, T2DM, stage III CKD, chronic HFpEF, PVD s/p left BKA, and seizure disorder who presented to the ED for wound on right foot with fever about 1.5 weeks after having a corn removed from the 4th toe by podiatry as an outpatient. MRI showed diffuse cellulitis, myofasciitis and fluid collections along 4th digit w/associated osteomyelitis. Orthopedics was consulted and he underwent right fourth and fifth ray amputation with VAC application on 0/04/4708 by Dr. Lorin Mercy.   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 Lorin Mercy; culture grew MRSA, blood cultures negative. Leukocytosis is improving but remains elevated.  - Continuing vancomycin, dosed per pharmacy. Will discuss with ID and change accordingly. AKI has been improving.  [name] debridement today per Dr. Lorin Mercy. - WBAT w/post-op shoe per ortho - Continue wound vac - Pain controlled as ordered.   Acute on chronic kidney disease stage III: Due to ATN (FENa elevated at 2) due to infection. No hydro on U/S.  - Improving. Will monitor and renally dose medications.   T2DM: At inpatient goal. HbA1c 7.1%.  - Continue lantus, SSI   Chronic diastolic heart failure: Euvolemic.  - Continue home medications  Hypertension: well-controlled. - No change in meds.   Anemia of chronic disease: Hgb stable.  Anemia panel mixed w/low iron and TIBC with elevated ferritin due to acute phase reactant elevation.  - Would likely benefit from iron as an outpatient following acute infection.   DVT prophylaxis: Heparin Code Status: Full Family Communication: Borther at bedside Disposition Plan: Home w/HH once cleared by orthopedics.  Consultants: Orthopedics Procedures:   09/02/2017 4th/5th ray amputation right foot by Dr. Lorin Mercy  Antimicrobials:  Vancomycin 1/2 >>   Cefepime, flagyl 1/2 - 1/7  Subjective: No pain w/vac change. No fevers or chills. Had good breakfast this AM before being told about surgery later today.   Objective: BP 140/70 (BP Location: Left Arm)   Pulse 66   Temp 98.2 F (36.8 C) (Oral)   Resp 18   Ht 6\' 2"  (1.88 m)   Wt 109.9 kg (242 lb 4.6 oz)   SpO2 96%   BMI 31.11 kg/m   Gen: 67 y.o. male in no distress Pulm: Non-labored breathing room air. Clear to auscultation bilaterally.  CV: Regular rate and rhythm. No murmur, rub, or gallop. No JVD. GI: Abdomen soft, non-tender, non-distended, with normoactive bowel sounds. No organomegaly or masses felt. Ext: Left BKA, right foot wrapped in ACE wrap, vac in place with good seal. Skin: As above. Neuro: Alert and oriented. No focal neurological deficits. Psych: Judgement and insight appear normal. Mood & affect appropriate.    CBC: Recent Labs  Lab 09/02/17 1054 09/03/17 0630 09/04/17 0604 09/06/17 0517 09/07/17 0556 09/08/17 0535  WBC 33.2* 29.9* 28.8* 25.1* 22.4* 21.7*  NEUTROABS 28.2*  --   --   --   --   --  HGB 8.8* 8.7* 8.6* 8.3* 9.3* 9.1*  HCT 27.1* 26.7* 26.3* 25.0* 28.4* 27.7*  MCV 89.4 89.3 90.1 89.3 89.6 89.4  PLT 412* 432* 499* 575* 705* 235*   Basic Metabolic Panel: Recent Labs  Lab 09/04/17 0604 09/05/17 0624 09/06/17 0517 09/07/17 0556 09/08/17 0535  NA 135 137 139 141 139  K 4.5 5.0 5.0 4.7 4.8  CL 111 115* 116* 115* 113*  CO2 18* 17* 18* 21* 19*  GLUCOSE 178* 169* 202* 163* 147*    BUN 65* 57* 60* 51* 54*  CREATININE 3.60* 3.19* 3.19* 2.77* 2.82*  CALCIUM 8.0* 8.2* 8.1* 8.6* 8.4*   GFR: Estimated Creatinine Clearance: 34.5 mL/min (A) (by C-G formula based on SCr of 2.82 mg/dL (H)).   CBG: Recent Labs  Lab 09/07/17 1144 09/07/17 1705 09/07/17 2236 09/08/17 0733 09/08/17 1208  GLUCAP 203* 137* 171* 132* 185*   Urine analysis:    Component Value Date/Time   COLORURINE YELLOW 08/31/2017 0818   APPEARANCEUR CLOUDY (A) 08/31/2017 0818   LABSPEC 1.015 08/31/2017 0818   PHURINE 5.0 08/31/2017 0818   GLUCOSEU 50 (A) 08/31/2017 0818   HGBUR SMALL (A) 08/31/2017 0818   BILIRUBINUR NEGATIVE 08/31/2017 0818   KETONESUR NEGATIVE 08/31/2017 0818   PROTEINUR 100 (A) 08/31/2017 0818   UROBILINOGEN 0.2 04/18/2015 1544   NITRITE NEGATIVE 08/31/2017 0818   LEUKOCYTESUR NEGATIVE 08/31/2017 0818   Recent Results (from the past 240 hour(s))  Culture, blood (routine x 2)     Status: None   Collection Time: 08/31/17  1:10 PM  Result Value Ref Range Status   Specimen Description BLOOD RIGHT ANTECUBITAL  Final   Special Requests   Final    BOTTLES DRAWN AEROBIC AND ANAEROBIC Blood Culture adequate volume   Culture   Final    NO GROWTH 5 DAYS Performed at [facility], [place] [address] [place], [place] [address]    Report Status 09/05/2017 FINAL  Final  Culture, blood (routine x 2)     Status: None   Collection Time: 08/31/17  1:15 PM  Result Value Ref Range Status   Specimen Description BLOOD LEFT HAND  Final   Special Requests IN PEDIATRIC BOTTLE Blood Culture adequate volume  Final   Culture   Final    NO GROWTH 5 DAYS Performed at [facility], [place] [address] [place], [place] [address]    Report Status 09/05/2017 FINAL  Final  Blood Cultures x 2 sites     Status: None   Collection Time: 08/31/17  5:30 PM  Result Value Ref Range Status   Specimen Description BLOOD LEFT ARM  Final   Special Requests   Final    BOTTLES DRAWN AEROBIC ONLY Blood  Culture adequate volume   Culture   Final    NO GROWTH 5 DAYS Performed at [facility], [place] [address] [place], [place] [address]    Report Status 09/05/2017 FINAL  Final  Surgical PCR screen     Status: Abnormal   Collection Time: 09/01/17  9:31 PM  Result Value Ref Range Status   MRSA, PCR POSITIVE (A) NEGATIVE Final    Comment: RESULT CALLED TO, READ BACK BY AND VERIFIED WITH: S CORE RN [phone] 01/044/19 A NAVARRO    Staphylococcus aureus POSITIVE (A) NEGATIVE Final    Comment: (NOTE) The Xpert SA Assay (FDA approved for NASAL specimens in patients 95 years of age and older), is one component of a comprehensive surveillance program. It is  not intended to diagnose infection nor to guide or monitor treatment.   Aerobic/Anaerobic Culture (surgical/deep wound)     Status: None   Collection Time: 09/02/17  6:33 PM  Result Value Ref Range Status   Specimen Description TOE RIGHT  Final   Special Requests NONE  Final   Gram Stain   Final    RARE WBC PRESENT,BOTH PMN AND MONONUCLEAR RARE GRAM POSITIVE COCCI    Culture   Final    MODERATE METHICILLIN RESISTANT STAPHYLOCOCCUS AUREUS NO ANAEROBES ISOLATED Performed at [facility], [place] [address], [place],  [address]    Report Status 09/07/2017 FINAL  Final   Organism ID, Bacteria METHICILLIN RESISTANT STAPHYLOCOCCUS AUREUS  Final      Susceptibility   Methicillin resistant staphylococcus aureus - MIC*    CIPROFLOXACIN >=8 RESISTANT Resistant     ERYTHROMYCIN >=8 RESISTANT Resistant     GENTAMICIN <=0.5 SENSITIVE Sensitive     OXACILLIN >=4 RESISTANT Resistant     TETRACYCLINE <=1 SENSITIVE Sensitive     VANCOMYCIN 1 SENSITIVE Sensitive     TRIMETH/SULFA <=10 SENSITIVE Sensitive     CLINDAMYCIN <=0.25 SENSITIVE Sensitive     RIFAMPIN <=0.5 SENSITIVE Sensitive     Inducible Clindamycin NEGATIVE Sensitive     * MODERATE METHICILLIN RESISTANT STAPHYLOCOCCUS AUREUS      Radiology Studies: No results  found.  Scheduled Meds: . amLODipine  10 mg Oral Daily  . aspirin EC  81 mg Oral Daily  . atorvastatin  20 mg Oral Daily  . carvedilol  12.5 mg Oral BID WC  . heparin injection (subcutaneous)  5,000 Units Subcutaneous Q8H  . insulin aspart  0-15 Units Subcutaneous TID WC  . insulin aspart  0-5 Units Subcutaneous QHS  . insulin glargine  15 Units Subcutaneous Daily  . lacosamide  200 mg Oral BID  . multivitamin with minerals  1 tablet Oral Daily  . mupirocin ointment   Nasal BID  . protein supplement shake  11 oz Oral BID BM   Continuous Infusions: . vancomycin 1,250 mg (09/06/17 1747)     LOS: 8 days   Time spent: 25 minutes.  Vance Gather, MD [facility] Pager [phone]  If 7PM-7AM, please contact night-coverage [email or web address] Password [email or web address] 09/08/2017, 4:21 PM

## 2017-09-08 NOTE — Op Note (Addendum)
Preop diagnosis: Post fourth and fifth ray amputation with VAC  Postop diagnosis: Same  Procedure: Sharp excisional debridement of right foot wound.  VAC change.  Anesthesia LMA general  Tourniquet: None  Procedure patient was prepped with DuraPrep up to the mid calf region and extremity sheets and drapes were applied.  Patient had had 2 VAC changes and yellow and white stringy nonviable tissue was present with about 40% red granulation tissue.  He was brought back to the operating room at this time for repeat debridement in order for an attempt to possibly save his foot.  There was subcutaneous tissue with pressure squeezing some purulent drainage appear to off the dorsum of the foot and also proximal area.  Elevating the skin some fourth and fifth extensor tendon extending into the extensor brevis was cut back which had some pro-material around it was stringy and necrotic.  Necrotic tissue was cut back sharply with 10 and 15 scalpel blade as well as pickups and surgical scissors.  Skin edges, subcutaneous tissue and necrotic tendon was sharply debrided.  No bone debridement was performed.  Some debridement was performed over the plantar surface of foot and purulence was able to be expressed from the plantar surface as well and continued irrigation and debridement until tissue looked better.  VAC was placed.  Despite VAC changes and IV antibiotics he remains with poor wound healing and at the surgical setting additional tissue had to be debrided.  Patient has an amputation on the opposite left leg with a below-knee amputation.  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  We will continue VAC changes and watch his wound.

## 2017-09-08 NOTE — Interval H&P Note (Signed)
History and Physical Interval Note:  09/08/2017 7:21 PM  Mark [name]  has presented today for surgery, with the diagnosis of DIABETIC RIGHT FOOT ULCER  The various methods of treatment have been discussed with the patient and family. After consideration of risks, benefits and other options for treatment, the patient has consented to  Procedure(s): DEBRIDEMENT RIGHT FOOT AND WOUND VAC CHANGE (Right) as a surgical intervention .  The patient's history has been reviewed, patient examined, no change in status, stable for surgery.  I have reviewed the patient's chart and labs.  Questions were answered to the patient's satisfaction.  Updated plan with daughter and wife at bedside, she wanted to see the foot wound and reviewed the necrotic stringy white tissue to be cleaned and debrided.    Mark [name]

## 2017-09-08 NOTE — Transfer of Care (Signed)
Immediate Anesthesia Transfer of Care Note  Patient: Mark [name]  Procedure(s) Performed: DEBRIDEMENT RIGHT FOOT AND WOUND VAC CHANGE (Right )  Patient Location: PACU  Anesthesia Type:General  Level of Consciousness: sedated  Airway & Oxygen Therapy: Patient Spontanous Breathing and Patient connected to face mask oxygen  Post-op Assessment: Report given to RN and Post -op Vital signs reviewed and stable  Post vital signs: Reviewed and stable  Last Vitals:  Vitals:   09/08/17 1437 09/08/17 1859  BP: 140/70 (!) 158/81  Pulse: 66 77  Resp: 18 18  Temp: 36.8 C 37.2 C  SpO2: 96% 94%    Last Pain:  Vitals:   09/08/17 1859  TempSrc: Oral  PainSc:       Patients Stated Pain Goal: 2 (05/67/88 9338)  Complications: No apparent anesthesia complications

## 2017-09-09 ENCOUNTER — Encounter (HOSPITAL_COMMUNITY): Payer: Self-pay | Admitting: Orthopaedic Surgery

## 2017-09-09 DIAGNOSIS — M86171 Other acute osteomyelitis, right ankle and foot: Secondary | ICD-10-CM

## 2017-09-09 DIAGNOSIS — B9562 Methicillin resistant Staphylococcus aureus infection as the cause of diseases classified elsewhere: Secondary | ICD-10-CM

## 2017-09-09 DIAGNOSIS — E1151 Type 2 diabetes mellitus with diabetic peripheral angiopathy without gangrene: Secondary | ICD-10-CM

## 2017-09-09 DIAGNOSIS — Z87891 Personal history of nicotine dependence: Secondary | ICD-10-CM

## 2017-09-09 DIAGNOSIS — Z978 Presence of other specified devices: Secondary | ICD-10-CM

## 2017-09-09 DIAGNOSIS — Z89421 Acquired absence of other right toe(s): Secondary | ICD-10-CM

## 2017-09-09 LAB — CBC
HCT: 24.7 % — ABNORMAL LOW (ref 39.0–52.0)
Hemoglobin: 8 g/dL — ABNORMAL LOW (ref 13.0–17.0)
MCH: 29.4 pg (ref 26.0–34.0)
MCHC: 32.4 g/dL (ref 30.0–36.0)
MCV: 90.8 fL (ref 78.0–100.0)
Platelets: 744 10*3/uL — ABNORMAL HIGH (ref 150–400)
RBC: 2.72 MIL/uL — ABNORMAL LOW (ref 4.22–5.81)
RDW: 14.2 % (ref 11.5–15.5)
WBC: 18.5 10*3/uL — ABNORMAL HIGH (ref 4.0–10.5)

## 2017-09-09 LAB — BASIC METABOLIC PANEL
Anion gap: 5 (ref 5–15)
BUN: 47 mg/dL — ABNORMAL HIGH (ref 6–20)
CO2: 21 mmol/L — ABNORMAL LOW (ref 22–32)
Calcium: 8.1 mg/dL — ABNORMAL LOW (ref 8.9–10.3)
Chloride: 115 mmol/L — ABNORMAL HIGH (ref 101–111)
Creatinine, Ser: 2.68 mg/dL — ABNORMAL HIGH (ref 0.61–1.24)
GFR calc Af Amer: 27 mL/min — ABNORMAL LOW (ref >60)
GFR calc non Af Amer: 23 mL/min — ABNORMAL LOW (ref >60)
Glucose, Bld: 188 mg/dL — ABNORMAL HIGH (ref 65–99)
Potassium: 4.8 mmol/L (ref 3.5–5.1)
Sodium: 141 mmol/L (ref 135–145)

## 2017-09-09 LAB — GLUCOSE, CAPILLARY
Glucose-Capillary: 166 mg/dL — ABNORMAL HIGH (ref 65–99)
Glucose-Capillary: 133 mg/dL — ABNORMAL HIGH (ref 65–99)
Glucose-Capillary: 159 mg/dL — ABNORMAL HIGH (ref 65–99)
Glucose-Capillary: 134 mg/dL — ABNORMAL HIGH (ref 65–99)

## 2017-09-09 LAB — CK: Total CK: 34 U/L — ABNORMAL LOW (ref 49–397)

## 2017-09-09 MED ORDER — SODIUM CHLORIDE 0.9 % IV SOLN
8.0000 mg/kg | INTRAVENOUS | Status: DC
  Filled 2017-09-09: qty 16.82

## 2017-09-09 MED ORDER — SODIUM CHLORIDE 0.9 % IV SOLN
800.0000 mg | INTRAVENOUS | Status: DC
  Filled 2017-09-09: qty 16

## 2017-09-09 NOTE — Consult Note (Signed)
[place] Nurse wound consult note Reason for Consult: s/p new debridement right foot surgical site Request for NPWT VAC dressing changes to continue M/W/F Pain medication offered prior to dressing change, patient denies need at this time.  Wound type: surgical  Pressure Injury POA: NA Measurement: 6cm x 3.5cm x 3.0cm  Wound bed: clean, pink, some darkness in base-appears to be from cautery  Drainage (amount, consistency, odor) minimal in canister Periwound:skin is peeling under large amounts of ioban from OR. Removed with dressing change  Dressing procedure/placement/frequency: Removed old dressing, cleaned wound with saline Lined edges of wound and use horseshoe shaped piece of ostomy barrier ring around adjacent toe. 1pc of black foam use to fill wound bed. Placed TRAC pad on the dorsal aspect of the wound bed to avoid pressure. Seal obtained at 19mmHG. Patient tolerated well, requested PO pain meds after dressing change  Offloading shoe in the room, patient to use if up with PT, he is aware.   [facility] nursing team will follow along for complex NPWT dressing changes. [place], [place], [place]

## 2017-09-09 NOTE — Consult Note (Signed)
[facility] for Infectious Disease  Total days of antibiotics 10        Day 10 vanco               Reason for Consult: MRSA osteo of foot    Referring Physician: grunz  Principal Problem:   Diabetic foot ulcer ([place]) Active Problems:   S/P BKA (below knee amputation) ([place])   Essential hypertension   Chronic diastolic CHF (congestive heart failure) (HCC)   Diabetes mellitus, type 2 (HCC)   GERD (gastroesophageal reflux disease)   CKD (chronic kidney disease) stage 3, GFR 30-59 ml/min (HCC)   Seizure disorder (HCC)    HPI: Mark [name] is a 67 y.o. male with DM with peripheral vascular complication with CKD 3, hx of left bka and right foot DFU who started to have worsening pain to his right foot, where he recently had a callous debrided by his podiatrist. He reported increasing drainage, swelling and redness to his foot. He was found to have fever of up to 101F and leukocytosis with WBC of 33.4, and acute on ckd with cr 3.8 up for baseline of 2.9-3. physical exam concerning for cellulitis/osteomyelitis since he had evidence of 4th toe necrosis of right foot. MRI showed:  Diffuse cellulitis and myofasciitis. 2. Fluid collections along the dorsum of the forefoot along the fourth digit could be small abscesses. 3. 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 OR cultures grew MRSA. He took patient back on 1/10 for wound vac change but also required further debridement in attempts to salvage the patients foot. He is on day 10 of iv abtx and wbc still elevated at 18.5, sed rate of 126. ID asked to weigh in. Patient has not had to be treated with IV abtx though he did have amputation of left leg for wound infection roughly 4-5 years ago per his report. He denies any current chills, fevers,   Past Medical History:  Diagnosis Date  . Acid indigestion     . Acute encephalopathy 01/01/2016  . Acute renal failure superimposed on stage 3 chronic kidney disease ([employer]) 04/16/2015  . Anemia 10/01/2013  . CHF (congestive heart failure) ([place])   . Chronic kidney disease   . CKD (chronic kidney disease) stage 3, GFR 30-59 ml/min (HCC) 08/18/2015  . Diabetes mellitus, type 2 ([place]) 04/16/2015  . Diverticulitis   . DM (diabetes mellitus), type 2 with peripheral vascular complications ([place])   . Elevated troponin 10/16/2014  . Essential hypertension 04/08/2014  . History of Clostridium difficile colitis 01/01/2016  . Hypertension    no pcp  . Hypothermia 01/01/2016  . Malnutrition of moderate degree ([place]) 04/17/2015  . Onychomycosis of toenail 04/30/2015  . Phantom limb pain ([place]) 12/12/2013  . Positive for microalbuminuria 08/18/2015  . S/P BKA (below knee amputation) ([place]) 11/21/2013   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    . Seizures ([place])   . Spleen absent   . Substance abuse ([place]) 04/02/2016   Cocaine  . Wound infection 01/02/2016    Allergies: No Known Allergies  MEDICATIONS: . amLODipine  10 mg Oral Daily  . aspirin EC  81 mg Oral Daily  . atorvastatin  20 mg Oral Daily  . carvedilol  12.5 mg Oral BID WC  . heparin injection (subcutaneous)  5,000 Units Subcutaneous  Q8H  . insulin aspart  0-15 Units Subcutaneous TID WC  . insulin aspart  0-5 Units Subcutaneous QHS  . insulin glargine  15 Units Subcutaneous Daily  . lacosamide  200 mg Oral BID  . multivitamin with minerals  1 tablet Oral Daily  . mupirocin ointment   Nasal BID  . protein supplement shake  11 oz Oral BID BM    Social History   Tobacco Use  . Smoking status: Former Smoker    Last attempt to quit: 08/03/2013    Years since quitting: 4.1  . Smokeless tobacco: Never Used  Substance Use Topics  . Alcohol use: No  . Drug use: No    Comment: 01-01-16     Family History  Problem Relation Age of Onset  . Diabetes Mother   . Cancer Father      Review of Systems  Constitutional:  Negative for fever, chills, diaphoresis, activity change, appetite change, fatigue and unexpected weight change.  HENT: Negative for congestion, sore throat, rhinorrhea, sneezing, trouble swallowing and sinus pressure.  Eyes: Negative for photophobia and visual disturbance.  Respiratory: Negative for cough, chest tightness, shortness of breath, wheezing and stridor.  Cardiovascular: Negative for chest pain, palpitations and leg swelling.  Gastrointestinal: Negative for nausea, vomiting, abdominal pain, diarrhea, constipation, blood in stool, abdominal distention and anal bleeding.  Genitourinary: Negative for dysuria, hematuria, flank pain and difficulty urinating.  Musculoskeletal: Negative for myalgias, back pain, joint swelling, arthralgias and gait problem.  Skin: +right foot wound per hpi Neurological: Negative for dizziness, tremors, weakness and light-headedness.  Hematological: Negative for adenopathy. Does not bruise/bleed easily.  Psychiatric/Behavioral: Negative for behavioral problems, confusion, sleep disturbance, dysphoric mood, decreased concentration and agitation.     OBJECTIVE: Temp:  [97.8 F (36.6 C)-99 F (37.2 C)] 98.4 F (36.9 C) (01/11 0454) Pulse Rate:  [66-83] 83 (01/11 0454) Resp:  [10-26] 20 (01/11 0454) BP: (128-158)/(70-84) 144/79 (01/11 0454) SpO2:  [94 %-97 %] 97 % (01/11 0454) Weight:  [231 lb 11.3 oz (105.1 kg)] 231 lb 11.3 oz (105.1 kg) (01/11 0527) Physical Exam  Constitutional: He is oriented to person, place, and time. He appears well-developed and well-nourished. No distress.  HENT:  Mouth/Throat: Oropharynx is clear and moist. No oropharyngeal exudate.  Cardiovascular: Normal rate, regular rhythm and normal heart sounds. Exam reveals no gallop and no friction rub.  No murmur heard.  Pulmonary/Chest: Effort normal and breath sounds normal. No respiratory distress. He has no wheezes.  Abdominal: Soft. Bowel sounds are normal. He exhibits no  distension. There is no tenderness.  Lymphadenopathy:  He has no cervical adenopathy.  Neurological: He is alert and oriented to person, place, and time.  Ext: left foot is a bka and right foot is wrapped with wound vac in place, trace edema Skin: right foot from recent amputation is wrapped Psychiatric: He has a normal mood and affect. His behavior is normal.     LABS: Results for orders placed or performed during the hospital encounter of 08/31/17 (from the past 48 hour(s))  Glucose, capillary     Status: Abnormal   Collection Time: 09/07/17 11:44 AM  Result Value Ref Range   Glucose-Capillary 203 (H) 65 - 99 mg/dL  Glucose, capillary     Status: Abnormal   Collection Time: 09/07/17  5:05 PM  Result Value Ref Range   Glucose-Capillary 137 (H) 65 - 99 mg/dL  Glucose, capillary     Status: Abnormal   Collection Time: 09/07/17 10:36 PM  Result  Value Ref Range   Glucose-Capillary 171 (H) 65 - 99 mg/dL  Basic metabolic panel     Status: Abnormal   Collection Time: 09/08/17  5:35 AM  Result Value Ref Range   Sodium 139 135 - 145 mmol/L   Potassium 4.8 3.5 - 5.1 mmol/L   Chloride 113 (H) 101 - 111 mmol/L   CO2 19 (L) 22 - 32 mmol/L   Glucose, Bld 147 (H) 65 - 99 mg/dL   BUN 54 (H) 6 - 20 mg/dL   Creatinine, Ser 2.82 (H) 0.61 - 1.24 mg/dL   Calcium 8.4 (L) 8.9 - 10.3 mg/dL   GFR calc non Af Amer 22 (L) >60 mL/min   GFR calc Af Amer 25 (L) >60 mL/min    Comment: (NOTE) The eGFR has been calculated using the CKD EPI equation. This calculation has not been validated in all clinical situations. eGFR's persistently <60 mL/min signify possible Chronic Kidney Disease.    Anion gap 7 5 - 15  CBC     Status: Abnormal   Collection Time: 09/08/17  5:35 AM  Result Value Ref Range   WBC 21.7 (H) 4.0 - 10.5 K/uL   RBC 3.10 (L) 4.22 - 5.81 MIL/uL   Hemoglobin 9.1 (L) 13.0 - 17.0 g/dL   HCT 27.7 (L) 39.0 - 52.0 %   MCV 89.4 78.0 - 100.0 fL   MCH 29.4 26.0 - 34.0 pg   MCHC 32.9 30.0 -  36.0 g/dL   RDW 14.1 11.5 - 15.5 %   Platelets 714 (H) 150 - 400 K/uL  Glucose, capillary     Status: Abnormal   Collection Time: 09/08/17  7:33 AM  Result Value Ref Range   Glucose-Capillary 132 (H) 65 - 99 mg/dL  Glucose, capillary     Status: Abnormal   Collection Time: 09/08/17 12:08 PM  Result Value Ref Range   Glucose-Capillary 185 (H) 65 - 99 mg/dL  Glucose, capillary     Status: Abnormal   Collection Time: 09/08/17  4:52 PM  Result Value Ref Range   Glucose-Capillary 114 (H) 65 - 99 mg/dL  Glucose, capillary     Status: Abnormal   Collection Time: 09/08/17  8:52 PM  Result Value Ref Range   Glucose-Capillary 125 (H) 65 - 99 mg/dL  CBC     Status: Abnormal   Collection Time: 09/09/17  6:06 AM  Result Value Ref Range   WBC 18.5 (H) 4.0 - 10.5 K/uL   RBC 2.72 (L) 4.22 - 5.81 MIL/uL   Hemoglobin 8.0 (L) 13.0 - 17.0 g/dL   HCT 24.7 (L) 39.0 - 52.0 %   MCV 90.8 78.0 - 100.0 fL   MCH 29.4 26.0 - 34.0 pg   MCHC 32.4 30.0 - 36.0 g/dL   RDW 14.2 11.5 - 15.5 %   Platelets 744 (H) 150 - 400 K/uL  Basic metabolic panel     Status: Abnormal   Collection Time: 09/09/17  6:06 AM  Result Value Ref Range   Sodium 141 135 - 145 mmol/L   Potassium 4.8 3.5 - 5.1 mmol/L   Chloride 115 (H) 101 - 111 mmol/L   CO2 21 (L) 22 - 32 mmol/L   Glucose, Bld 188 (H) 65 - 99 mg/dL   BUN 47 (H) 6 - 20 mg/dL   Creatinine, Ser 2.68 (H) 0.61 - 1.24 mg/dL   Calcium 8.1 (L) 8.9 - 10.3 mg/dL   GFR calc non Af Amer 23 (L) >60 mL/min   GFR  calc Af Amer 27 (L) >60 mL/min    Comment: (NOTE) The eGFR has been calculated using the CKD EPI equation. This calculation has not been validated in all clinical situations. eGFR's persistently <60 mL/min signify possible Chronic Kidney Disease.    Anion gap 5 5 - 15  Glucose, capillary     Status: Abnormal   Collection Time: 09/09/17  7:46 AM  Result Value Ref Range   Glucose-Capillary 166 (H) 65 - 99 mg/dL   Lab Results  Component Value Date   ESRSEDRATE  126 (H) 08/31/2017     MICRO: Blood cx x 3 on 1/2 NGTD wound cx  MRSA on 1/4 IMAGING: I have reviewed his mri from 1/3 that should diffuse swelling of soft tissue as well as fluid collection on plantar aspect of 4th digit/ highlight of bone concern for osteo  Assessment/Plan:  67yo M with DM c/b PVD, peripheral neuropathy, CKD 3 and DFU presents with right foot cellulitis, osteo s/p debridement x 2  - recommend to treat for 6 wk with daptomycin dosed at 52m/kg renally dosed. Will check baseline ck. Currently he likely still has some level of infection still in the affected area - he will need picc line if he does not undergo further amputation - if he does undergo further amputation can treat with oral doxycycline as "mop up" depending on margins  Diabetes = may benefit from diabetes education. He mentioned he likes to keep his BS in the 778s   Health maintenace = will recheck hep c.  ckd 3= appears getting close back to baseline. We will recommend to stop vancomycin to minimize nephrotoxicity. We will plan on giving him daptomycin, renally dosed  Will see back on Monday. Dr CMegan Salonavailable for questions

## 2017-09-09 NOTE — Progress Notes (Signed)
PROGRESS NOTE  Mark [name]  XTG:[id number] DOB: [birth year] DOA: 08/31/2017 PCP: [name], MD   Brief Narrative: Mark [name] is a 67 y.o. male with a history of HTN, T2DM, stage III CKD, chronic HFpEF, PVD s/p left BKA, and seizure disorder who presented to the ED for wound on right foot with fever about 1.5 weeks after having a corn removed from the 4th toe by podiatry as an outpatient. MRI showed diffuse cellulitis, myofasciitis and fluid collections along 4th digit w/associated osteomyelitis. Orthopedics was consulted and he underwent right fourth and fifth ray amputation with VAC application on 10/06/348 by Dr. Lorin Mercy.  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 Lorin Mercy; culture grew MRSA, blood cultures negative. Leukocytosis is improving but remains elevated. Repeat I&D 1/10.   - I've asked for ID recommendations: starting daptomycin. - WBAT w/post-op shoe per ortho - Continue wound vac - Pain controlled as ordered.   Acute on chronic kidney disease stage III: Due to ATN (FENa elevated at 2) due to infection. No hydro on U/S. Overall trend over past 18 months indicates progressively worsening CKD.  - Improving. Will monitor and renally dose medications.   T2DM: At inpatient goal. HbA1c 7.1%.  - Continue lantus, SSI  Chronic diastolic heart failure: Euvolemic.  - Continue home medications  Hypertension: well-controlled. - No change in meds.   Anemia of chronic disease: Hgb stable. Anemia panel  mixed w/low iron and TIBC with elevated ferritin due to acute phase reactant elevation.  - Would likely benefit from iron as an outpatient following acute infection.  DVT prophylaxis: Heparin Code Status: Full Family Communication: None at bedside Disposition Plan: Uncertain, hopeful for Home w/HH once cleared by orthopedics.  Consultants: Orthopedics Procedures:   09/02/2017 4th/5th ray amputation right foot. - Dr. Lorin Mercy  09/08/2017 Sharp excisional debridement of right foot wound, VAC change. - Dr. Lorin Mercy.  Antimicrobials:  Vancomycin 1/2 >>   Cefepime, flagyl 1/2 - 1/7  Subjective: He is tired and hungry because he didn't sleep well last night but has controlled pain; and has not had breakfast due to remaining NPO.   Objective: BP (!) 144/79 (BP Location: Right Arm)   Pulse 83   Temp 98.4 F (36.9 C) (Oral)   Resp 20   Ht 6\' 2"  (1.88 m)   Wt 105.1 kg (231 lb 11.3 oz)   SpO2 97%   BMI 29.75 kg/m   Gen: 67 y.o. male in no distress Pulm: Non-labored breathing room air. Clear to auscultation bilaterally.  CV: Regular rate and rhythm. No murmur, rub, or gallop. No JVD. GI: Abdomen soft, non-tender, non-distended, with normoactive bowel sounds. No organomegaly or masses felt. Ext: Left BKA, right foot wrapped in ACE wrap, vac in place with good seal. Skin: As above. Neuro: Alert and oriented. No focal neurological deficits. Psych: Judgement and insight appear normal. Mood & affect appropriate.    CBC: Recent Labs  Lab 09/04/17 0604 09/06/17 0517 09/07/17 0556 09/08/17 0535 09/09/17 0606  WBC 28.8* 25.1* 22.4*  21.7* 18.5*  HGB 8.6* 8.3* 9.3* 9.1* 8.0*  HCT 26.3* 25.0* 28.4* 27.7* 24.7*  MCV 90.1 89.3 89.6 89.4 90.8  PLT 499* 575* 705* 714* 102*   Basic Metabolic Panel: Recent Labs  Lab 09/05/17 0624 09/06/17 0517 09/07/17 0556 09/08/17 0535 09/09/17 0606  NA 137 139 141 139 141  K 5.0 5.0 4.7 4.8 4.8  CL 115* 116* 115* 113* 115*  CO2 17* 18* 21* 19* 21*    GLUCOSE 169* 202* 163* 147* 188*  BUN 57* 60* 51* 54* 47*  CREATININE 3.19* 3.19* 2.77* 2.82* 2.68*  CALCIUM 8.2* 8.1* 8.6* 8.4* 8.1*   GFR: Estimated Creatinine Clearance: 35.5 mL/min (A) (by C-G formula based on SCr of 2.68 mg/dL (H)).   CBG: Recent Labs  Lab 09/08/17 1208 09/08/17 1652 09/08/17 2052 09/09/17 0746 09/09/17 1152  GLUCAP 185* 114* 125* 166* 133*   Urine analysis:    Component Value Date/Time   COLORURINE YELLOW 08/31/2017 0818   APPEARANCEUR CLOUDY (A) 08/31/2017 0818   LABSPEC 1.015 08/31/2017 0818   PHURINE 5.0 08/31/2017 0818   GLUCOSEU 50 (A) 08/31/2017 0818   HGBUR SMALL (A) 08/31/2017 0818   BILIRUBINUR NEGATIVE 08/31/2017 0818   KETONESUR NEGATIVE 08/31/2017 0818   PROTEINUR 100 (A) 08/31/2017 0818   UROBILINOGEN 0.2 04/18/2015 1544   NITRITE NEGATIVE 08/31/2017 0818   LEUKOCYTESUR NEGATIVE 08/31/2017 0818   Recent Results (from the past 240 hour(s))  Culture, blood (routine x 2)     Status: None   Collection Time: 08/31/17  1:10 PM  Result Value Ref Range Status   Specimen Description BLOOD RIGHT ANTECUBITAL  Final   Special Requests   Final    BOTTLES DRAWN AEROBIC AND ANAEROBIC Blood Culture adequate volume   Culture   Final    NO GROWTH 5 DAYS Performed at [facility], [place] [address], [place], [place] [address]    Report Status 09/05/2017 FINAL  Final  Culture, blood (routine x 2)     Status: None   Collection Time: 08/31/17  1:15 PM  Result Value Ref Range Status   Specimen Description BLOOD LEFT HAND  Final   Special Requests IN PEDIATRIC BOTTLE Blood Culture adequate volume  Final   Culture   Final    NO GROWTH 5 DAYS Performed at [facility], [place] [address] [place], [place] [address]    Report Status 09/05/2017 FINAL  Final  Blood Cultures x 2 sites     Status: None   Collection Time: 08/31/17  5:30 PM  Result Value Ref Range Status   Specimen Description BLOOD LEFT ARM  Final   Special Requests   Final     BOTTLES DRAWN AEROBIC ONLY Blood Culture adequate volume   Culture   Final    NO GROWTH 5 DAYS Performed at [facility], [place] [address], [place], [place] [address]    Report Status 09/05/2017 FINAL  Final  Surgical PCR screen     Status: Abnormal   Collection Time: 09/01/17  9:31 PM  Result Value Ref Range Status   MRSA, PCR POSITIVE (A) NEGATIVE Final    Comment: RESULT CALLED TO, READ BACK BY AND VERIFIED WITH: S CORE RN [phone] 01/044/19 A NAVARRO    Staphylococcus aureus POSITIVE (A) NEGATIVE Final    Comment: (NOTE) The Xpert SA Assay (FDA approved for NASAL specimens in patients 73 years of age and older), is one component of a comprehensive surveillance program. It is not  intended to diagnose infection nor to guide or monitor treatment.   Aerobic/Anaerobic Culture (surgical/deep wound)     Status: None   Collection Time: 09/02/17  6:33 PM  Result Value Ref Range Status   Specimen Description TOE RIGHT  Final   Special Requests NONE  Final   Gram Stain   Final    RARE WBC PRESENT,BOTH PMN AND MONONUCLEAR RARE GRAM POSITIVE COCCI    Culture   Final    MODERATE METHICILLIN RESISTANT STAPHYLOCOCCUS AUREUS NO ANAEROBES ISOLATED Performed at [facility], [place] [address] [place], [place] [address]    Report Status 09/07/2017 FINAL  Final   Organism ID, Bacteria METHICILLIN RESISTANT STAPHYLOCOCCUS AUREUS  Final      Susceptibility   Methicillin resistant staphylococcus aureus - MIC*    CIPROFLOXACIN >=8 RESISTANT Resistant     ERYTHROMYCIN >=8 RESISTANT Resistant     GENTAMICIN <=0.5 SENSITIVE Sensitive     OXACILLIN >=4 RESISTANT Resistant     TETRACYCLINE <=1 SENSITIVE Sensitive     VANCOMYCIN 1 SENSITIVE Sensitive     TRIMETH/SULFA <=10 SENSITIVE Sensitive     CLINDAMYCIN <=0.25 SENSITIVE Sensitive     RIFAMPIN <=0.5 SENSITIVE Sensitive     Inducible Clindamycin NEGATIVE Sensitive     * MODERATE METHICILLIN RESISTANT STAPHYLOCOCCUS AUREUS       Radiology Studies: No results found.  Scheduled Meds: . amLODipine  10 mg Oral Daily  . aspirin EC  81 mg Oral Daily  . atorvastatin  20 mg Oral Daily  . carvedilol  12.5 mg Oral BID WC  . heparin injection (subcutaneous)  5,000 Units Subcutaneous Q8H  . insulin aspart  0-15 Units Subcutaneous TID WC  . insulin aspart  0-5 Units Subcutaneous QHS  . insulin glargine  15 Units Subcutaneous Daily  . lacosamide  200 mg Oral BID  . multivitamin with minerals  1 tablet Oral Daily  . mupirocin ointment   Nasal BID  . protein supplement shake  11 oz Oral BID BM   Continuous Infusions: . [START ON 09/10/2017] DAPTOmycin (CUBICIN)  IV       LOS: 9 days   Time spent: 25 minutes.  Vance Gather, MD [facility] Pager [phone]  If 7PM-7AM, please contact night-coverage [email or web address] Password [email or web address] 09/09/2017, 1:55 PM

## 2017-09-09 NOTE — Care Management Important Message (Signed)
Important Message  Patient Details IM Letter given to Rhonda/Case Manager to present to Patient Name: Mark [name] MRN: [id number] Date of Birth: May 24, 1951   Medicare Important Message Given:  Yes    Kerin Salen 09/09/2017, 10:50 AMImportant Message  Patient Details  Name: Mark [name] MRN: [id number] Date of Birth: [birth year]   Medicare Important Message Given:  Yes    Kerin Salen 09/09/2017, 10:50 AM

## 2017-09-09 NOTE — Progress Notes (Signed)
[facility] Infusion Coordinator will follow pt with ID Team to support home IV ABX at DC if ordered.  If patient discharges after hours, please call [phone].   Larry Sierras 09/09/2017, 4:04 PM

## 2017-09-09 NOTE — Progress Notes (Signed)
Physical Therapy Treatment Patient Details Name: Mark [name] MRN: [id number] DOB: 05/13/1951 Today's Date: 09/09/2017    History of Present Illness 67 year old male with a past medical history significant for hypertension, diabetes, chronic kidney disease is stage III, seizures, left BKA and chronic diastolic heart failure; who presented to the emergency department secondary to right foot redness, swelling and drainage. Dx of osteomyelitis, s/p 4-5th ray amputations.  1/10/19[name] excisional debridement of right foot wound.  VAC change    PT Comments    Pt assisted with ambulating in hallway.  Pt requiring mod assist to rise from recliner surface and min assist for ambulation.   Pt prefers home with HHPT.    Follow Up Recommendations  Home health PT; assist for mobility     Equipment Recommendations  None recommended by PT    Recommendations for Other Services       Precautions / Restrictions Precautions Precautions: Fall Precaution Comments: NPWT R foot Restrictions Other Position/Activity Restrictions: WBAT with Darco shoe RLE    Mobility  Bed Mobility               General bed mobility comments: pt up in recliner  Transfers Overall transfer level: Needs assistance Equipment used: Rolling walker (2 wheeled) Transfers: Sit to/from Stand Sit to Stand: Mod assist         General transfer comment: mod assist from recliner surface, increased time and effort  Ambulation/Gait Ambulation/Gait assistance: Min assist Ambulation Distance (Feet): 60 Feet Assistive device: Rolling walker (2 wheeled) Gait Pattern/deviations: Step-to pattern;Trunk flexed     General Gait Details: darco shoe on R foot, heavy use of BUEs, distance limited by fatigue, trunk flexion increased with fatigue, VCs for posture and to stay within RW   Stairs            Wheelchair Mobility    Modified Rankin (Stroke Patients Only)       Balance                                             Cognition Arousal/Alertness: Awake/alert Behavior During Therapy: WFL for tasks assessed/performed Overall Cognitive Status: Within Functional Limits for tasks assessed                                        Exercises      General Comments        Pertinent Vitals/Pain Pain Assessment: 0-10 Pain Score: 7  Pain Location: R foot Pain Descriptors / Indicators: Sore Pain Intervention(s): Repositioned;Limited activity within patient's tolerance;Monitored during session;Premedicated before session(elevated on pillows)    Home Living                      Prior Function            PT Goals (current goals can now be found in the care plan section) Progress towards PT goals: Progressing toward goals    Frequency    Min 3X/week      PT Plan Current plan remains appropriate    Co-evaluation              AM-PAC PT "6 Clicks" Daily Activity  Outcome Measure  Difficulty turning over in bed (including adjusting bedclothes, sheets and blankets)?: A Little Difficulty moving  from lying on back to sitting on the side of the bed? : A Little Difficulty sitting down on and standing up from a chair with arms ([name].g., wheelchair, bedside commode, etc,.)?: Unable Help needed moving to and from a bed to chair (including a wheelchair)?: A Lot Help needed walking in hospital room?: A Little Help needed climbing 3-5 steps with a railing? : A Lot 6 Click Score: 14    End of Session Equipment Utilized During Treatment: Gait belt Activity Tolerance: Patient tolerated treatment well Patient left: in chair;with call bell/phone within reach   PT Visit Diagnosis: Difficulty in walking, not elsewhere classified (R26.2)     Time: 8032-1224 PT Time Calculation (min) (ACUTE ONLY): 15 min  Charges:  $Gait Training: 8-22 mins                    G Codes:       Carmelia Bake, PT, DPT 09/09/2017 Pager: [phone]    Mark Ram  [name] 09/09/2017, 4:49 PM

## 2017-09-09 NOTE — Progress Notes (Addendum)
Pharmacy Antibiotic Note  Mark [name] is a 67 y.o. male admitted on 08/31/2017 with possible diabetic foot infection and osteomyelitis.  Pharmacy has been consulted for daptomycin dosing.   Day #10 antibiotics  afebrile  Renal: CKD - SCr =2.68  WBC 18.5, remains elevated but improving  1/4 Ortho 4th/5th ray amputation  OR culture 1/4: MRSA  1/10 VAC change and I&D in OR - additional tissue had to be debrided, poor wound healing  Based on vancomycin levels: AUC = 537, ke 0.0236  Last dose of vancomycin 1250 q48hr 1/10 at 1800  Baseline CK drawn today = 34   Plan: Start Daptomycin 8 mg/kg q48 - first dose due 1/12 at 1800 Weekly CK qFriday to monitor daptomycin  Height: 6\' 2"  (188 cm) Weight: 231 lb 11.3 oz (105.1 kg) IBW/kg (Calculated) : 82.2  Temp (24hrs), Avg:98.3 F (36.8 C), Min:97.8 F (36.6 C), Max:99 F (37.2 C)  Recent Labs  Lab 09/04/17 0604 09/04/17 2240 09/05/17 0624 09/06/17 0517 09/07/17 0556 09/08/17 0535 09/09/17 0606  WBC 28.8*  --   --  25.1* 22.4* 21.7* 18.5*  CREATININE 3.60*  --  3.19* 3.19* 2.77* 2.82*  --   VANCOTROUGH  --   --   --  17  --   --   --   VANCOPEAK  --  35  --   --   --   --   --     Estimated Creatinine Clearance: 33.8 mL/min (A) (by C-G formula based on SCr of 2.82 mg/dL (H)).    No Known Allergies  Antimicrobials this admission:  1/2 Vancomycin >>1/1 1/12 dapto>> 1/2 Cefepime >> 1/8 1/2 Flagyl >>1/8 Dose adjustments this admission:  1/6 2240 VPk = 35 (dose 1250gm IV q48h - dose 1/6 at 19:06), vanco random level 1/8 05:17 = 17  (OK to use random level since > 1 t1/2 from 1st level) Microbiology results:  1/2 BCx x 3: NGF 1/3 MRSA PCR: Positive 1/4 R toe (doesn't specify if soft tissue, bone, or other fluid): MRSA Thank you for allowing pharmacy to be a part of this patient's care.  Eudelia Bunch, Pharm.D. [phone] 09/09/2017 7:20 AM

## 2017-09-10 LAB — BASIC METABOLIC PANEL
Anion gap: 6 (ref 5–15)
BUN: 48 mg/dL — ABNORMAL HIGH (ref 6–20)
CO2: 22 mmol/L (ref 22–32)
Calcium: 8.3 mg/dL — ABNORMAL LOW (ref 8.9–10.3)
Chloride: 111 mmol/L (ref 101–111)
Creatinine, Ser: 2.77 mg/dL — ABNORMAL HIGH (ref 0.61–1.24)
GFR calc Af Amer: 26 mL/min — ABNORMAL LOW (ref >60)
GFR calc non Af Amer: 22 mL/min — ABNORMAL LOW (ref >60)
Glucose, Bld: 140 mg/dL — ABNORMAL HIGH (ref 65–99)
Potassium: 5 mmol/L (ref 3.5–5.1)
Sodium: 139 mmol/L (ref 135–145)

## 2017-09-10 LAB — GLUCOSE, CAPILLARY
Glucose-Capillary: 129 mg/dL — ABNORMAL HIGH (ref 65–99)
Glucose-Capillary: 116 mg/dL — ABNORMAL HIGH (ref 65–99)
Glucose-Capillary: 206 mg/dL — ABNORMAL HIGH (ref 65–99)

## 2017-09-10 LAB — CBC
HCT: 25.6 % — ABNORMAL LOW (ref 39.0–52.0)
Hemoglobin: 8.2 g/dL — ABNORMAL LOW (ref 13.0–17.0)
MCH: 29.2 pg (ref 26.0–34.0)
MCHC: 32 g/dL (ref 30.0–36.0)
MCV: 91.1 fL (ref 78.0–100.0)
Platelets: 808 10*3/uL — ABNORMAL HIGH (ref 150–400)
RBC: 2.81 MIL/uL — ABNORMAL LOW (ref 4.22–5.81)
RDW: 14 % (ref 11.5–15.5)
WBC: 17.2 10*3/uL — ABNORMAL HIGH (ref 4.0–10.5)

## 2017-09-10 MED ORDER — POLYETHYLENE GLYCOL 3350 17 G PO PACK
17.0000 g | PACK | Freq: Two times a day (BID) | ORAL | Status: DC
  Administered 2017-09-10 – 2017-09-11 (×3): 17 g via ORAL
  Filled 2017-09-10 (×6): qty 1

## 2017-09-10 MED ORDER — DAPTOMYCIN 500 MG IV SOLR
800.0000 mg | INTRAVENOUS | Status: DC
  Administered 2017-09-10 – 2017-09-12 (×2): 800 mg via INTRAVENOUS
  Filled 2017-09-10 (×3): qty 16

## 2017-09-10 MED ORDER — SODIUM CHLORIDE 0.9 % IV SOLN: 800.0000 mg | INTRAVENOUS | Status: DC

## 2017-09-10 MED ORDER — BISACODYL 10 MG RE SUPP: 10.0000 mg | Freq: Every day | RECTAL | Status: DC | PRN

## 2017-09-10 NOTE — Progress Notes (Signed)
PROGRESS NOTE  SILVER PARKEY  YQI:[id number] DOB: 1950/09/30 DOA: 08/31/2017 PCP: [name], MD   Brief Narrative: Mark [name] is a 67 y.o. male with a history of HTN, T2DM, stage III CKD, chronic HFpEF, PVD s/p left BKA, and seizure disorder who presented to the ED for wound on right foot with fever about 1.5 weeks after having a corn removed from the 4th toe by podiatry as an outpatient. MRI showed diffuse cellulitis, myofasciitis and fluid collections along 4th digit w/associated osteomyelitis. Orthopedics was consulted and he underwent right fourth and fifth ray amputation with VAC application on 11/05/7562 by Dr. Lorin Mercy. Taken back to OR for vac change 1/10, requiring further debridement. Vancomycin switched to daptomycin 1/11 per ID recommendations.   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 Lorin Mercy; culture grew MRSA, blood cultures negative. Leukocytosis is improving but remains elevated. Repeat I&D 1/10.   - I've asked for ID recommendations: started daptomycin (weekly CK). - WBAT w/post-op shoe per ortho - Continue wound vac - Pain controlled as ordered.   Acute on chronic kidney disease stage III: Due to ATN (FENa elevated at 2) due to infection. No hydro on U/S. Overall trend over past 18 months indicates progressively worsening CKD.  - Improving. Will monitor and renally dose medications.   T2DM: At inpatient goal. HbA1c 7.1%.  - Continue lantus, SSI  Chronic diastolic heart failure: Euvolemic.   - Continue home medications  Hypertension: well-controlled. - No change in meds.   Anemia of chronic disease: Hgb stable. Anemia panel mixed w/low iron and TIBC with elevated ferritin due to acute phase reactant elevation.  - Would likely benefit from iron as an outpatient following acute infection.  Seizure disorder: Stable - Continue vimpat.   Constipation:  - Will schedule miralax as this prn hasn't been given; continue dulcolax prn and dulcolax supp prn.   DVT prophylaxis: Heparin Code Status: Full Family Communication: None at bedside Disposition Plan: Uncertain pending clinical course. Consultants: Orthopedics, ID Procedures:   09/02/2017 4th/5th ray amputation right foot. - Dr. Lorin Mercy  09/08/2017 Sharp excisional debridement of right foot wound, VAC change. - Dr. Lorin Mercy.  Antimicrobials:  Vancomycin 1/2 - 1/11   Cefepime, flagyl 1/2 - 1/7  Daptomycin 1/11 >>   Subjective: Has some acute on chronic right hip pain (had this replaced previously) but this is improved. Moving bowels daily but has been hard, requesting stool softener.   Objective: BP (!) 154/72 (BP Location: Left Arm)   Pulse 72   Temp 98.9 F (37.2 C) (Oral)   Resp 18   Ht 6\' 2"  (1.88 m)   Wt 107.2 kg (236 lb 5.3 oz)   SpO2 96%   BMI 30.34 kg/m   Gen: 67 y.o. male in no distress Pulm: Non-labored breathing room air. Clear to auscultation bilaterally.  CV: Regular rate and rhythm. No murmur, rub, or gallop. No JVD. GI: Abdomen soft, non-tender, benign, non-distended, with normoactive bowel sounds. No organomegaly or masses felt. Ext: Left BKA, right foot  wrapped in ACE wrap, vac in place with good seal. No edema in RLE. Full ROM at hip.  Skin: As above. Neuro: Alert and oriented. No focal neurological deficits. Psych: Judgement and insight appear normal. Mood & affect appropriate.    CBC: Recent Labs  Lab 09/06/17 0517 09/07/17 0556 09/08/17 0535 09/09/17 0606 09/10/17 0558  WBC 25.1*  22.4* 21.7* 18.5* 17.2*  HGB 8.3* 9.3* 9.1* 8.0* 8.2*  HCT 25.0* 28.4* 27.7* 24.7* 25.6*  MCV 89.3 89.6 89.4 90.8 91.1  PLT 575* 705* 714* 744* 631*   Basic Metabolic Panel: Recent Labs  Lab 09/06/17 0517 09/07/17 0556 09/08/17 0535 09/09/17 0606 09/10/17 0558  NA 139 141 139 141 139  K 5.0 4.7 4.8 4.8 5.0  CL 116* 115* 113* 115* 111  CO2 18* 21* 19* 21* 22  GLUCOSE 202* 163* 147* 188* 140*  BUN 60* 51* 54* 47* 48*  CREATININE 3.19* 2.77* 2.82* 2.68* 2.77*  CALCIUM 8.1* 8.6* 8.4* 8.1* 8.3*   GFR: Estimated Creatinine Clearance: 34.7 mL/min (A) (by C-G formula based on SCr of 2.77 mg/dL (H)).   CBG: Recent Labs  Lab 09/09/17 0746 09/09/17 1152 09/09/17 1714 09/09/17 2040 09/10/17 0734  GLUCAP 166* 133* 159* 134* 129*   Urine analysis:    Component Value Date/Time   COLORURINE YELLOW 08/31/2017 0818   APPEARANCEUR CLOUDY (A) 08/31/2017 0818   LABSPEC 1.015 08/31/2017 0818   PHURINE 5.0 08/31/2017 0818   GLUCOSEU 50 (A) 08/31/2017 0818   HGBUR SMALL (A) 08/31/2017 0818   BILIRUBINUR NEGATIVE 08/31/2017 0818   KETONESUR NEGATIVE 08/31/2017 0818   PROTEINUR 100 (A) 08/31/2017 0818   UROBILINOGEN 0.2 04/18/2015 1544   NITRITE NEGATIVE 08/31/2017 0818   LEUKOCYTESUR NEGATIVE 08/31/2017 0818   Recent Results (from the past 240 hour(s))  Culture, blood (routine x 2)     Status: None   Collection Time: 08/31/17  1:10 PM  Result Value Ref Range Status   Specimen Description BLOOD RIGHT ANTECUBITAL  Final   Special Requests   Final    BOTTLES DRAWN AEROBIC AND ANAEROBIC Blood Culture adequate volume   Culture   Final    NO GROWTH 5 DAYS Performed at [facility], [place] [address] [place], [place] [address]    Report Status 09/05/2017 FINAL  Final  Culture, blood (routine x 2)     Status: None   Collection Time: 08/31/17  1:15 PM  Result Value Ref Range Status   Specimen Description BLOOD LEFT HAND  Final   Special Requests IN PEDIATRIC BOTTLE Blood Culture  adequate volume  Final   Culture   Final    NO GROWTH 5 DAYS Performed at [facility], [place] [address] [place] [address], [place], [place] [address]    Report Status 09/05/2017 FINAL  Final  Blood Cultures x 2 sites     Status: None   Collection Time: 08/31/17  5:30 PM  Result Value Ref Range Status   Specimen Description BLOOD LEFT ARM  Final   Special Requests   Final    BOTTLES DRAWN AEROBIC ONLY Blood Culture adequate volume   Culture   Final    NO GROWTH 5 DAYS Performed at [facility], [place] [address], [place], [place] [address]    Report Status 09/05/2017 FINAL  Final  Surgical PCR screen     Status: Abnormal   Collection Time: 09/01/17  9:31 PM  Result Value Ref Range Status   MRSA, PCR POSITIVE (A) NEGATIVE Final  Comment: RESULT CALLED TO, READ BACK BY AND VERIFIED WITH: S CORE RN [phone] 01/044/19 A NAVARRO    Staphylococcus aureus POSITIVE (A) NEGATIVE Final    Comment: (NOTE) The Xpert SA Assay (FDA approved for NASAL specimens in patients 36 years of age and older), is one component of a comprehensive surveillance program. It is not intended to diagnose infection nor to guide or monitor treatment.   Aerobic/Anaerobic Culture (surgical/deep wound)     Status: None   Collection Time: 09/02/17  6:33 PM  Result Value Ref Range Status   Specimen Description TOE RIGHT  Final   Special Requests NONE  Final   Gram Stain   Final    RARE WBC PRESENT,BOTH PMN AND MONONUCLEAR RARE GRAM POSITIVE COCCI    Culture   Final    MODERATE METHICILLIN RESISTANT STAPHYLOCOCCUS AUREUS NO ANAEROBES ISOLATED Performed at [facility], [place] [address] [place], [place] [address]    Report Status 09/07/2017 FINAL  Final   Organism ID, Bacteria METHICILLIN RESISTANT STAPHYLOCOCCUS AUREUS  Final      Susceptibility   Methicillin resistant staphylococcus aureus - MIC*    CIPROFLOXACIN >=8 RESISTANT Resistant     ERYTHROMYCIN >=8 RESISTANT Resistant     GENTAMICIN <=0.5 SENSITIVE  Sensitive     OXACILLIN >=4 RESISTANT Resistant     TETRACYCLINE <=1 SENSITIVE Sensitive     VANCOMYCIN 1 SENSITIVE Sensitive     TRIMETH/SULFA <=10 SENSITIVE Sensitive     CLINDAMYCIN <=0.25 SENSITIVE Sensitive     RIFAMPIN <=0.5 SENSITIVE Sensitive     Inducible Clindamycin NEGATIVE Sensitive     * MODERATE METHICILLIN RESISTANT STAPHYLOCOCCUS AUREUS      Radiology Studies: No results found.  Scheduled Meds: . amLODipine  10 mg Oral Daily  . aspirin EC  81 mg Oral Daily  . atorvastatin  20 mg Oral Daily  . carvedilol  12.5 mg Oral BID WC  . heparin injection (subcutaneous)  5,000 Units Subcutaneous Q8H  . insulin aspart  0-15 Units Subcutaneous TID WC  . insulin aspart  0-5 Units Subcutaneous QHS  . insulin glargine  15 Units Subcutaneous Daily  . lacosamide  200 mg Oral BID  . multivitamin with minerals  1 tablet Oral Daily  . mupirocin ointment   Nasal BID  . protein supplement shake  11 oz Oral BID BM   Continuous Infusions: . DAPTOmycin (CUBICIN)  IV       LOS: 10 days   Time spent: 25 minutes.  Vance Gather, MD [facility] Pager [phone]  If 7PM-7AM, please contact night-coverage [email or web address] Password [facility] 09/10/2017, 10:15 AM

## 2017-09-10 NOTE — Progress Notes (Signed)
Patient requesting stool softener.  MD notified via text page.

## 2017-09-11 LAB — BASIC METABOLIC PANEL
Anion gap: 6 (ref 5–15)
BUN: 44 mg/dL — ABNORMAL HIGH (ref 6–20)
CO2: 21 mmol/L — ABNORMAL LOW (ref 22–32)
Calcium: 8.4 mg/dL — ABNORMAL LOW (ref 8.9–10.3)
Chloride: 112 mmol/L — ABNORMAL HIGH (ref 101–111)
Creatinine, Ser: 2.75 mg/dL — ABNORMAL HIGH (ref 0.61–1.24)
GFR calc Af Amer: 26 mL/min — ABNORMAL LOW (ref >60)
GFR calc non Af Amer: 23 mL/min — ABNORMAL LOW (ref >60)
Glucose, Bld: 163 mg/dL — ABNORMAL HIGH (ref 65–99)
Potassium: 5 mmol/L (ref 3.5–5.1)
Sodium: 139 mmol/L (ref 135–145)

## 2017-09-11 LAB — CBC
HCT: 27.7 % — ABNORMAL LOW (ref 39.0–52.0)
Hemoglobin: 8.8 g/dL — ABNORMAL LOW (ref 13.0–17.0)
MCH: 29 pg (ref 26.0–34.0)
MCHC: 31.8 g/dL (ref 30.0–36.0)
MCV: 91.4 fL (ref 78.0–100.0)
Platelets: 914 10*3/uL (ref 150–400)
RBC: 3.03 MIL/uL — ABNORMAL LOW (ref 4.22–5.81)
RDW: 14.2 % (ref 11.5–15.5)
WBC: 15.4 10*3/uL — ABNORMAL HIGH (ref 4.0–10.5)

## 2017-09-11 LAB — GLUCOSE, CAPILLARY
Glucose-Capillary: 115 mg/dL — ABNORMAL HIGH (ref 65–99)
Glucose-Capillary: 148 mg/dL — ABNORMAL HIGH (ref 65–99)
Glucose-Capillary: 114 mg/dL — ABNORMAL HIGH (ref 65–99)
Glucose-Capillary: 215 mg/dL — ABNORMAL HIGH (ref 65–99)
Glucose-Capillary: 171 mg/dL — ABNORMAL HIGH (ref 65–99)

## 2017-09-11 LAB — HEPATITIS C ANTIBODY: HCV Ab: <0.1 s/co ratio (ref 0.0–0.9)

## 2017-09-11 NOTE — Progress Notes (Signed)
PROGRESS NOTE  Mark [name]  MWU:[id number] DOB: 1950/10/03 DOA: 08/31/2017 PCP: [name], MD   Brief Narrative: Mark [name] is a 67 y.o. male with a history of HTN, T2DM, stage III CKD, chronic HFpEF, PVD s/p left BKA, and seizure disorder who presented to the ED for wound on right foot with fever about 1.5 weeks after having a corn removed from the 4th toe by podiatry as an outpatient. MRI showed diffuse cellulitis, myofasciitis and fluid collections along 4th digit w/associated osteomyelitis. Orthopedics was consulted and he underwent right fourth and fifth ray amputation with VAC application on 02/28/5365 by Dr. Lorin Mercy. Taken back to OR for vac change 1/10, requiring further debridement. Vancomycin switched to daptomycin 1/11 per ID recommendations.   Assessment & Plan: Principal Problem:   Diabetic foot ulcer ([place]) Active Problems:   S/P BKA (below knee amputation) (HCC)   Essential hypertension   Chronic diastolic CHF (congestive heart failure) (HCC)   Diabetes mellitus, type 2 (HCC)   GERD (gastroesophageal reflux disease)   CKD (chronic kidney disease) stage 3, GFR 30-59 ml/min (HCC)   Seizure disorder (HCC)  Diabetic right foot ulcer, MRSA osteomyelitis and abscess of the fourth toe: s/p fourth and fifth ray amputation right foot, VAC applied 1/4 by Dr. Lorin Mercy; culture grew MRSA, blood cultures negative. Leukocytosis is improving but remains elevated. Repeat I&D 1/10.   - I've asked for ID recommendations: started daptomycin (weekly CK) 1/11. - WBAT w/post-op shoe per ortho - Continue wound vac - Pain controlled as ordered.  - Orthopedics to reevaluate.   Acute on chronic kidney disease stage III: Due to ATN (FENa elevated at 2) due to infection. No hydro on U/S. Overall trend over past 18 months indicates progressively worsening CKD.  - Improving, feel we are at his baseline ~2.7.  - Will monitor and renally dose medications, avoiding nephrotoxins.   T2DM: Has  remained at inpatient goal. HbA1c 7.1%.  - Continue lantus, SSI  Chronic diastolic heart failure: Euvolemic.  - Continue home medications including norvasc, lipitor, coreg  Hypertension: well-controlled. - No change in meds.   Anemia of chronic disease: Hgb stable. Anemia panel mixed w/low iron and TIBC with elevated ferritin due to acute phase reactant elevation.  - Would likely benefit from iron as an outpatient following acute infection.  Seizure disorder: Stable - Continue vimpat.   Constipation: Large BM 1/12. - Continue preventive regimen with dulcolax prn and dulcolax supp prn.   DVT prophylaxis: Heparin Code Status: Full Family Communication: None at bedside Disposition Plan: Uncertain, pending clinical course. Consultants: Orthopedics, ID Procedures:   09/02/2017 4th/5th ray amputation right foot. - Dr. Lorin Mercy  09/08/2017 Sharp excisional debridement of right foot wound, VAC change. - Dr. Lorin Mercy.  Antimicrobials:  Vancomycin 1/2 - 1/11   Cefepime, flagyl 1/2 - 1/7  Daptomycin 1/11 >>   Subjective: Upset that they sent his tray this morning with grits. Right hip pain resolved. No other issues. Feels generally better over the past couple days. Had large BM yesterday.   Objective: BP (!) 149/74 (BP Location: Right Arm)   Pulse 74   Temp 98.1 F (36.7 C) (Oral)   Resp 17   Ht 6\' 2"  (1.88 m)   Wt 106.6 kg (235 lb 0.2 oz)   SpO2 98%   BMI 30.17 kg/m   Gen: 67 y.o. male in no distress Pulm: Non-labored breathing room air. Clear to auscultation bilaterally.  CV: Regular rate and rhythm. No murmur, rub, or gallop.  No JVD. GI: Abdomen soft, non-tender, benign, non-distended, with normoactive bowel sounds. No masses or palpable stool burden. Ext: Left BKA, right foot wrapped in ACE wrap, vac in place with good seal. No edema in RLE. Full ROM at hip.  Skin: As above. Neuro: Alert and oriented. No focal neurological deficits. Psych: Judgement and insight appear  normal. Mood & affect appropriate.    CBC: Recent Labs  Lab 09/07/17 0556 09/08/17 0535 09/09/17 0606 09/10/17 0558 09/11/17 0705  WBC 22.4* 21.7* 18.5* 17.2* 15.4*  HGB 9.3* 9.1* 8.0* 8.2* 8.8*  HCT 28.4* 27.7* 24.7* 25.6* 27.7*  MCV 89.6 89.4 90.8 91.1 91.4  PLT 705* 714* 744* 808* 993*   Basic Metabolic Panel: Recent Labs  Lab 09/07/17 0556 09/08/17 0535 09/09/17 0606 09/10/17 0558 09/11/17 0705  NA 141 139 141 139 139  K 4.7 4.8 4.8 5.0 5.0  CL 115* 113* 115* 111 112*  CO2 21* 19* 21* 22 21*  GLUCOSE 163* 147* 188* 140* 163*  BUN 51* 54* 47* 48* 44*  CREATININE 2.77* 2.82* 2.68* 2.77* 2.75*  CALCIUM 8.6* 8.4* 8.1* 8.3* 8.4*   GFR: Estimated Creatinine Clearance: 34.8 mL/min (A) (by C-G formula based on SCr of 2.75 mg/dL (H)).   CBG: Recent Labs  Lab 09/10/17 1122 09/10/17 1640 09/10/17 2113 09/11/17 0752 09/11/17 1221  GLUCAP 116* 206* 115* 148* 114*   Urine analysis:    Component Value Date/Time   COLORURINE YELLOW 08/31/2017 0818   APPEARANCEUR CLOUDY (A) 08/31/2017 0818   LABSPEC 1.015 08/31/2017 0818   PHURINE 5.0 08/31/2017 0818   GLUCOSEU 50 (A) 08/31/2017 0818   HGBUR SMALL (A) 08/31/2017 0818   BILIRUBINUR NEGATIVE 08/31/2017 0818   KETONESUR NEGATIVE 08/31/2017 0818   PROTEINUR 100 (A) 08/31/2017 0818   UROBILINOGEN 0.2 04/18/2015 1544   NITRITE NEGATIVE 08/31/2017 0818   LEUKOCYTESUR NEGATIVE 08/31/2017 0818   Recent Results (from the past 240 hour(s))  Surgical PCR screen     Status: Abnormal   Collection Time: 09/01/17  9:31 PM  Result Value Ref Range Status   MRSA, PCR POSITIVE (A) NEGATIVE Final    Comment: RESULT CALLED TO, READ BACK BY AND VERIFIED WITH: S CORE RN [phone] 01/044/19 A NAVARRO    Staphylococcus aureus POSITIVE (A) NEGATIVE Final    Comment: (NOTE) The Xpert SA Assay (FDA approved for NASAL specimens in patients 20 years of age and older), is one component of a comprehensive surveillance program. It is not  intended to diagnose infection nor to guide or monitor treatment.   Aerobic/Anaerobic Culture (surgical/deep wound)     Status: None   Collection Time: 09/02/17  6:33 PM  Result Value Ref Range Status   Specimen Description TOE RIGHT  Final   Special Requests NONE  Final   Gram Stain   Final    RARE WBC PRESENT,BOTH PMN AND MONONUCLEAR RARE GRAM POSITIVE COCCI    Culture   Final    MODERATE METHICILLIN RESISTANT STAPHYLOCOCCUS AUREUS NO ANAEROBES ISOLATED Performed at [facility], [place] [address], [place], [place] [address]    Report Status 09/07/2017 FINAL  Final   Organism ID, Bacteria METHICILLIN RESISTANT STAPHYLOCOCCUS AUREUS  Final      Susceptibility   Methicillin resistant staphylococcus aureus - MIC*    CIPROFLOXACIN >=8 RESISTANT Resistant     ERYTHROMYCIN >=8 RESISTANT Resistant     GENTAMICIN <=0.5 SENSITIVE Sensitive     OXACILLIN >=4 RESISTANT Resistant     TETRACYCLINE <=1 SENSITIVE Sensitive  VANCOMYCIN 1 SENSITIVE Sensitive     TRIMETH/SULFA <=10 SENSITIVE Sensitive     CLINDAMYCIN <=0.25 SENSITIVE Sensitive     RIFAMPIN <=0.5 SENSITIVE Sensitive     Inducible Clindamycin NEGATIVE Sensitive     * MODERATE METHICILLIN RESISTANT STAPHYLOCOCCUS AUREUS      Radiology Studies: No results found.  Scheduled Meds: . amLODipine  10 mg Oral Daily  . aspirin EC  81 mg Oral Daily  . atorvastatin  20 mg Oral Daily  . carvedilol  12.5 mg Oral BID WC  . heparin injection (subcutaneous)  5,000 Units Subcutaneous Q8H  . insulin aspart  0-15 Units Subcutaneous TID WC  . insulin aspart  0-5 Units Subcutaneous QHS  . insulin glargine  15 Units Subcutaneous Daily  . lacosamide  200 mg Oral BID  . multivitamin with minerals  1 tablet Oral Daily  . mupirocin ointment   Nasal BID  . polyethylene glycol  17 g Oral BID  . protein supplement shake  11 oz Oral BID BM   Continuous Infusions: . DAPTOmycin (CUBICIN)  IV 800 mg (09/10/17 1758)     LOS: 11 days    Time spent: 25 minutes.  Vance Gather, MD [facility] Pager [phone]  If 7PM-7AM, please contact night-coverage [email or web address] Password [email or web address] 09/11/2017, 1:37 PM

## 2017-09-11 NOTE — Progress Notes (Signed)
CRITICAL VALUE ALERT  Critical Value:  914  Date & Time Notied:  09/11/17  Provider Notified: Dr. Bonner Puna via text page  Orders Received/Actions taken:

## 2017-09-12 DIAGNOSIS — Z9714 Presence of artificial left leg (complete) (partial): Secondary | ICD-10-CM

## 2017-09-12 DIAGNOSIS — E1140 Type 2 diabetes mellitus with diabetic neuropathy, unspecified: Secondary | ICD-10-CM

## 2017-09-12 LAB — CBC
HCT: 26.9 % — ABNORMAL LOW (ref 39.0–52.0)
Hemoglobin: 8.7 g/dL — ABNORMAL LOW (ref 13.0–17.0)
MCH: 29.5 pg (ref 26.0–34.0)
MCHC: 32.3 g/dL (ref 30.0–36.0)
MCV: 91.2 fL (ref 78.0–100.0)
Platelets: 928 10*3/uL (ref 150–400)
RBC: 2.95 MIL/uL — ABNORMAL LOW (ref 4.22–5.81)
RDW: 14 % (ref 11.5–15.5)
WBC: 12.6 10*3/uL — ABNORMAL HIGH (ref 4.0–10.5)

## 2017-09-12 LAB — PATHOLOGIST SMEAR REVIEW

## 2017-09-12 LAB — BASIC METABOLIC PANEL
Anion gap: 7 (ref 5–15)
BUN: 38 mg/dL — ABNORMAL HIGH (ref 6–20)
CO2: 23 mmol/L (ref 22–32)
Calcium: 8.5 mg/dL — ABNORMAL LOW (ref 8.9–10.3)
Chloride: 111 mmol/L (ref 101–111)
Creatinine, Ser: 2.66 mg/dL — ABNORMAL HIGH (ref 0.61–1.24)
GFR calc Af Amer: 27 mL/min — ABNORMAL LOW (ref >60)
GFR calc non Af Amer: 24 mL/min — ABNORMAL LOW (ref >60)
Glucose, Bld: 115 mg/dL — ABNORMAL HIGH (ref 65–99)
Potassium: 4.9 mmol/L (ref 3.5–5.1)
Sodium: 141 mmol/L (ref 135–145)

## 2017-09-12 LAB — GLUCOSE, CAPILLARY
Glucose-Capillary: 115 mg/dL — ABNORMAL HIGH (ref 65–99)
Glucose-Capillary: 172 mg/dL — ABNORMAL HIGH (ref 65–99)
Glucose-Capillary: 119 mg/dL — ABNORMAL HIGH (ref 65–99)
Glucose-Capillary: 127 mg/dL — ABNORMAL HIGH (ref 65–99)

## 2017-09-12 NOTE — Progress Notes (Addendum)
Date:  September 12, 2017 Chart reviewed for concurrent status and case management needs.  Will continue to follow patient progress.  POD 4 debridement of rt foot and wound vac changed on [id number]/wound stable and ok to dc home hhc vac. Discharge Planning: following for needs and possible home wound vac.  Expected discharge date: January 172019 Ralyn Stlaurent, BSN, [place], [place]

## 2017-09-12 NOTE — Progress Notes (Addendum)
PROGRESS NOTE  Mark [name]  IZT:[id number] DOB: Jan 20, 1951 DOA: 08/31/2017 PCP: [name], MD   Brief Narrative: Mark [name] is a 67 y.o. male with a history of HTN, T2DM, stage III CKD, chronic HFpEF, PVD s/p left BKA, and seizure disorder who presented to the ED for wound on right foot with fever about 1.5 weeks after having a corn removed from the 4th toe by podiatry as an outpatient. MRI showed diffuse cellulitis, myofasciitis and fluid collections along 4th digit w/associated osteomyelitis. Orthopedics was consulted and he underwent right fourth and fifth ray amputation with VAC application on 11/05/2503 by Dr. Lorin Mercy. Taken back to OR for vac change 1/10, requiring further debridement. Vancomycin switched to daptomycin 1/11 per ID recommendations.   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 Lorin Mercy; culture grew MRSA, blood cultures negative. Leukocytosis is improving but remains elevated. Repeat I&D 1/10.   - Continue daptomycin 1/11, continue x6 weeks (stop date 10/20/2017). Will need weekly CK, Cr. Will need to be administered thru central access (has severe CKD), not PICC. 6 weeks too long for midline IV.  - WBAT w/post-op shoe per ortho. Dr. Lorin Mercy feels further surgery not necessary.  - Continue wound vac. - Pain controlled as ordered.   Acute on chronic kidney disease stage III: Due to ATN (FENa elevated at 2) due to infection. No hydro on U/S. Overall trend over past 18 months indicates progressively  worsening CKD.  - Improving, feel we are at his baseline ~2.7.  - Will monitor and renally dose medications, avoiding nephrotoxins. - Avoid PICC.   T2DM: Has remained at inpatient goal. HbA1c 7.1%.  - Continue lantus, SSI  Chronic diastolic heart failure: Euvolemic.  - Continue home medications including norvasc, lipitor, coreg  Hypertension: well-controlled. - No change in meds.   Anemia of chronic disease: Hgb stable. Anemia panel mixed w/low iron and TIBC with elevated ferritin due to acute phase reactant elevation.  - Would likely benefit from iron as an outpatient following acute infection.  Seizure disorder: Stable - Continue vimpat.   Constipation: Large BM 1/12. - Continue preventive regimen with dulcolax prn and dulcolax supp prn.   DVT prophylaxis: Heparin Code Status: Full Family Communication: None at bedside Disposition Plan: Home w/HH vs. SNF. Awaiting PT reevaluation. Awaiting placement of long term vascular access.  Consultants: Orthopedics, ID Procedures:   09/02/2017 4th/5th ray amputation right foot. - Dr. Lorin Mercy  09/08/2017 Sharp excisional debridement of right foot wound, VAC change. - Dr. Lorin Mercy.  Antimicrobials:  Vancomycin 1/2 - 1/11   Cefepime, flagyl 1/2 - 1/7  Daptomycin 1/11 - 10/20/2017  Subjective: Wants to go to a facility for vac and IV abx x6 weeks. Pain controlled.  Objective: BP (!) 151/74 (BP Location: Right Arm)   Pulse 79   Temp 98.8 F (37.1 C) (Oral)   Resp 14   Ht 6\' 2"  (1.88 m)   Wt 106.7 kg (235 lb 3.7 oz)   SpO2 97%   BMI 30.20 kg/m  Gen: 66 y.o. male in no distress Pulm: Non-labored breathing room air. Clear to auscultation bilaterally.  CV: Regular rate and rhythm. No murmur, rub, or gallop. No JVD. GI: Abdomen soft, non-tender, benign, non-distended, with normoactive bowel sounds. No masses or palpable stool burden. Ext: Left BKA, right foot wrapped in ACE wrap, vac in place with good seal. No edema in RLE.  Sensation in tact at toes, motor function 5/5 in toes of right foot. Full ROM at hip.  Skin: As above. Neuro: Alert and oriented. No focal neurological deficits. Psych: Judgement and insight appear normal. Mood & affect appropriate.   CBC: Recent Labs  Lab 09/08/17 0535 09/09/17 0606 09/10/17 0558 09/11/17 0705 09/12/17 0551  WBC 21.7* 18.5* 17.2* 15.4* 12.6*  HGB 9.1* 8.0* 8.2* 8.8* 8.7*  HCT 27.7* 24.7* 25.6* 27.7* 26.9*  MCV 89.4 90.8 91.1 91.4 91.2  PLT 714* 744* 808* 914* 833*   Basic Metabolic Panel: Recent Labs  Lab 09/08/17 0535 09/09/17 0606 09/10/17 0558 09/11/17 0705 09/12/17 0551  NA 139 141 139 139 141  K 4.8 4.8 5.0 5.0 4.9  CL 113* 115* 111 112* 111  CO2 19* 21* 22 21* 23  GLUCOSE 147* 188* 140* 163* 115*  BUN 54* 47* 48* 44* 38*  CREATININE 2.82* 2.68* 2.77* 2.75* 2.66*  CALCIUM 8.4* 8.1* 8.3* 8.4* 8.5*   GFR: Estimated Creatinine Clearance: 36 mL/min (A) (by C-G formula based on SCr of 2.66 mg/dL (H)).   CBG: Recent Labs  Lab 09/11/17 1221 09/11/17 1653 09/11/17 2028 09/12/17 0801 09/12/17 1154  GLUCAP 114* 215* 171* 115* 172*   Urine analysis:    Component Value Date/Time   COLORURINE YELLOW 08/31/2017 0818   APPEARANCEUR CLOUDY (A) 08/31/2017 0818   LABSPEC 1.015 08/31/2017 0818   PHURINE 5.0 08/31/2017 0818   GLUCOSEU 50 (A) 08/31/2017 0818   HGBUR SMALL (A) 08/31/2017 0818   BILIRUBINUR NEGATIVE 08/31/2017 0818   KETONESUR NEGATIVE 08/31/2017 0818   PROTEINUR 100 (A) 08/31/2017 0818   UROBILINOGEN 0.2 04/18/2015 1544   NITRITE NEGATIVE 08/31/2017 0818   LEUKOCYTESUR NEGATIVE 08/31/2017 0818   Recent Results (from the past 240 hour(s))  Aerobic/Anaerobic Culture (surgical/deep wound)     Status: None   Collection Time: 09/02/17  6:33 PM  Result Value Ref Range Status   Specimen Description TOE RIGHT  Final   Special Requests NONE  Final   Gram Stain   Final    RARE WBC PRESENT,BOTH PMN AND MONONUCLEAR RARE GRAM POSITIVE  COCCI    Culture   Final    MODERATE METHICILLIN RESISTANT STAPHYLOCOCCUS AUREUS NO ANAEROBES ISOLATED Performed at [facility], [place] [address], [place], [place] [address]    Report Status 09/07/2017 FINAL  Final   Organism ID, Bacteria METHICILLIN RESISTANT STAPHYLOCOCCUS AUREUS  Final      Susceptibility   Methicillin resistant staphylococcus aureus - MIC*    CIPROFLOXACIN >=8 RESISTANT Resistant     ERYTHROMYCIN >=8 RESISTANT Resistant     GENTAMICIN <=0.5 SENSITIVE Sensitive     OXACILLIN >=4 RESISTANT Resistant     TETRACYCLINE <=1 SENSITIVE Sensitive     VANCOMYCIN 1 SENSITIVE Sensitive     TRIMETH/SULFA <=10 SENSITIVE Sensitive     CLINDAMYCIN <=0.25 SENSITIVE Sensitive     RIFAMPIN <=0.5 SENSITIVE Sensitive     Inducible Clindamycin NEGATIVE Sensitive     * MODERATE METHICILLIN RESISTANT STAPHYLOCOCCUS AUREUS      Radiology Studies: No results found.  Scheduled Meds: . amLODipine  10 mg Oral Daily  . aspirin EC  81 mg Oral Daily  . atorvastatin  20 mg Oral Daily  . carvedilol  12.5 mg Oral BID WC  . heparin injection (subcutaneous)  5,000 Units Subcutaneous Q8H  . insulin aspart  0-15 Units Subcutaneous TID WC  . insulin aspart  0-5 Units Subcutaneous QHS  . insulin glargine  15 Units Subcutaneous Daily  . lacosamide  200 mg Oral BID  . multivitamin with minerals  1 tablet Oral Daily  . mupirocin ointment   Nasal BID  . polyethylene glycol  17 g Oral BID  . protein supplement shake  11 oz Oral BID BM   Continuous Infusions: . DAPTOmycin (CUBICIN)  IV 800 mg (09/10/17 1758)     LOS: 12 days   Time spent: 25 minutes.  Vance Gather, MD [facility] Pager [phone]  If 7PM-7AM, please contact night-coverage [email or web address] Password [facility] 09/12/2017, 12:46 PM

## 2017-09-12 NOTE — Consult Note (Signed)
[place] Nurse wound follow up Wound type:Surgical debridement of nonhealing surgical wound to right foot. 4 th and 5th ray amputation. Denies need for analgesia at this time.  Measurement: 6 cm x 3.5 cm x 3 cm  Wound bed:15% devitalized tissue, yellow adherent slough Drainage (amount, consistency, odor) moderate serosanguinous effluent in canister.   Periwound: intact sutures below open wound.  Protect with barrier ring.  Dressing procedure/placement/frequency:Barrier ring to periwound to improve seal. Black foam to wound bed and bridged to dorsal fott.  Seal immediately achieved at 125 mm Hg.  Change Mon/Wed/Fri WOC team will follow.  Domenic Moras RN BSN [place] Pager [phone]

## 2017-09-12 NOTE — Progress Notes (Addendum)
[place] for Infectious Disease    Date of Admission:  08/31/2017   Total days of antibiotics 13, dapto QOD  ID: Mark [name] is a 67 y.o. male with MRSA DFU/osteo s/p 4th and 5th ray amputation  Principal Problem:   Diabetic foot ulcer ([place]) Active Problems:   S/P BKA (below knee amputation) (HCC)   Essential hypertension   Chronic diastolic CHF (congestive heart failure) (HCC)   Diabetes mellitus, type 2 (HCC)   GERD (gastroesophageal reflux disease)   CKD (chronic kidney disease) stage 3, GFR 30-59 ml/min (HCC)   Seizure disorder (HCC)    Subjective: Afebrile, leukotcytosis improving. Not needing further debridement. Denies pain in his affected right foot (though he suffers from diabetic neuropathy). He ambulated with PT today while wearing his left leg prosthesis and right foot wound vac  Medications:  . amLODipine  10 mg Oral Daily  . aspirin EC  81 mg Oral Daily  . atorvastatin  20 mg Oral Daily  . carvedilol  12.5 mg Oral BID WC  . heparin injection (subcutaneous)  5,000 Units Subcutaneous Q8H  . insulin aspart  0-15 Units Subcutaneous TID WC  . insulin aspart  0-5 Units Subcutaneous QHS  . insulin glargine  15 Units Subcutaneous Daily  . lacosamide  200 mg Oral BID  . multivitamin with minerals  1 tablet Oral Daily  . mupirocin ointment   Nasal BID  . polyethylene glycol  17 g Oral BID  . protein supplement shake  11 oz Oral BID BM    Objective: Vital signs in last 24 hours: Temp:  [97.6 F (36.4 C)-98.8 F (37.1 C)] 97.6 F (36.4 C) (01/14 1357) Pulse Rate:  [78-89] 89 (01/14 1357) Resp:  [14-17] 14 (01/14 1357) BP: (133-151)/(71-74) 133/71 (01/14 1357) SpO2:  [96 %-98 %] 96 % (01/14 1357) Weight:  [235 lb 3.7 oz (106.7 kg)] 235 lb 3.7 oz (106.7 kg) (01/14 0538)  Physical Exam  Constitutional: He is oriented to person, place, and time. He appears well-developed and well-nourished. No distress.  HENT:  Mouth/Throat: Oropharynx is clear and  moist. No oropharyngeal exudate.  Cardiovascular: Normal rate, regular rhythm and normal heart sounds. Exam reveals no gallop and no friction rub.  No murmur heard.  Pulmonary/Chest: Effort normal and breath sounds normal. No respiratory distress. He has no wheezes.  Abdominal: Soft. Bowel sounds are normal. He exhibits no distension. There is no tenderness.  Ext: left BKA, right foot wrapped with wound vac s/p 4th and 5th ray amputation Neurological: He is alert and oriented to person, place, and time. Decreased sensation to right foot Skin: Skin is warm and dry. No rash noted. No erythema.  Psychiatric: He has a normal mood and affect. His behavior is normal.      Lab Results Recent Labs    09/11/17 0705 09/12/17 0551  WBC 15.4* 12.6*  HGB 8.8* 8.7*  HCT 27.7* 26.9*  NA 139 141  K 5.0 4.9  CL 112* 111  CO2 21* 23  BUN 44* 38*  CREATININE 2.75* 2.66*   Sedimentation Rate Lab Results  Component Value Date   ESRSEDRATE 126 (H) 08/31/2017   Lab Results  Component Value Date   CRP 28.0 (H) 08/31/2017    Microbiology: Susceptibility from 1/4    Methicillin resistant staphylococcus aureus    MIC    CIPROFLOXACIN >=8 RESISTANT  Resistant    CLINDAMYCIN <=0.25 SENS... Sensitive    ERYTHROMYCIN >=8 RESISTANT  Resistant  GENTAMICIN <=0.5 SENSI... Sensitive    Inducible Clindamycin NEGATIVE  Sensitive    OXACILLIN >=4 RESISTANT  Resistant    RIFAMPIN <=0.5 SENSI... Sensitive    TETRACYCLINE <=1 SENSITIVE  Sensitive    TRIMETH/SULFA <=10 SENSIT... Sensitive    VANCOMYCIN 1 SENSITIVE  Sensitive         Susceptibility Comments   Methicillin resistant staphylococcus aureus  MODERATE METHICILLIN RESISTANT STAPHYLOCOCCUS AUREUS    Studies/Results: No results found.   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 Bone involvement. - plan to treat for 6 wk, currently on day 10 of 42 - recommend picc line  appropriate for his renal failure - will need to continue with daptomycin 8mg /kg/QOD (dosed per his renal function) to complete course - continue with weekly ck, cbc, and bmp along with weekly picc line care - patient hoping to be placed in SNF -plan discussed with dr Bonner Puna ---------------------------- Home health/SNF  orders listed below ------------------------------------- Diagnosis: Diabetic foot osteo  Culture Result: mrsa  No Known Allergies  OPAT Orders Discharge antibiotics: Per pharmacy protocol  daptomycin at 8mg /kg Duration: 32 more days End Date: Feb 17th  [place] Per Protocol:  Labs weekly while on IV antibiotics: _x_ CBC with differential _x_ BMP __ CK _x_ sed rate at last blood draw  x__ Please pull PIC at completion of IV antibiotics   Fax weekly labs to [phone]  Clinic Follow Up Appt: In 5 wk with dr Kaisa Wofford   [facility] for [facility]ious Diseases Cell: [phone] Pager: [phone]  09/12/2017, 3:17 PM

## 2017-09-12 NOTE — Progress Notes (Signed)
   Subjective: 4 Days Post-Op Procedure(s) (LRB): DEBRIDEMENT RIGHT FOOT AND WOUND VAC CHANGE (Right) Patient reports pain as 1 on 0-10 scale.  " I'm worried I am going to loose my foot "   Objective: Vital signs in last 24 hours: Temp:  [98.7 F (37.1 C)-98.8 F (37.1 C)] 98.8 F (37.1 C) (01/14 0538) Pulse Rate:  [72-79] 79 (01/14 0538) Resp:  [14-17] 14 (01/14 0538) BP: (139-151)/(72-74) 151/74 (01/14 0538) SpO2:  [97 %-99 %] 97 % (01/14 0538) Weight:  [235 lb 3.7 oz (106.7 kg)] 235 lb 3.7 oz (106.7 kg) (01/14 0538)  Intake/Output from previous day: 01/13 0701 - 01/14 0700 In: 240 [P.O.:240] Out: 2855 [Urine:2850; Drains:5] Intake/Output this shift: No intake/output data recorded.  Recent Labs    09/10/17 0558 09/11/17 0705 09/12/17 0551  HGB 8.2* 8.8* 8.7*   Recent Labs    09/11/17 0705 09/12/17 0551  WBC 15.4* 12.6*  RBC 3.03* 2.95*  HCT 27.7* 26.9*  PLT 914* 928*   Recent Labs    09/11/17 0705 09/12/17 0551  NA 139 141  K 5.0 4.9  CL 112* 111  CO2 21* 23  BUN 44* 38*  CREATININE 2.75* 2.66*  GLUCOSE 163* 115*  CALCIUM 8.4* 8.5*   No results for input(s): LABPT, INR in the last 72 hours.  PE:    VAC removed wound looks better . VAC today by [facility] RN  No results found.  Assessment/Plan: 4 Days Post-Op Procedure(s) (LRB): DEBRIDEMENT RIGHT FOOT AND WOUND VAC CHANGE (Right) Plan:   OK for discharge home when medically stable and HH VAC changes 3 X per week. Office F/U with me in 3 wks.     [name] 09/12/2017, 8:54 AM

## 2017-09-12 NOTE — Progress Notes (Signed)
Physical Therapy Treatment Patient Details Name: Mark [name] MRN: [id number] DOB: 02-Jul-1951 Today's Date: 09/12/2017    History of Present Illness 67 year old male with a past medical history significant for hypertension, diabetes, chronic kidney disease is stage III, seizures, left BKA and chronic diastolic heart failure; who presented to the emergency department secondary to right foot redness, swelling and drainage. Dx of osteomyelitis, s/p 4-5th ray amputations.  1/10/19[name] excisional debridement of right foot wound.  VAC change    PT Comments    Patient progressing with gait distance, but reports will be home alone most of the day and does not feel his wife can assist to manage wound and medications at home.  Concerned about his foot and states willing to consider SNF level rehab at d/c.  Feel given balance difficulties with prosthesis on R and DARCO shoe on L and hitting walker on shoe and dealing with wound vac at home would place pt a high fall risk.  Recommend SNF level rehab at d/c.   Follow Up Recommendations  SNF     Equipment Recommendations  None recommended by PT    Recommendations for Other Services       Precautions / Restrictions Precautions Precautions: Fall Precaution Comments: NPWT R foot Restrictions Other Position/Activity Restrictions: WBAT with Darco shoe RLE; prosthesis L LE    Mobility  Bed Mobility Overal bed mobility: Needs Assistance Bed Mobility: Sidelying to Sit   Sidelying to sit: Supervision;HOB elevated       General bed mobility comments: use of rail  Transfers Overall transfer level: Needs assistance Equipment used: Rolling walker (2 wheeled) Transfers: Sit to/from Stand Sit to Stand: Min assist;From elevated surface         General transfer comment: elevated height of bed, pt donned prosthesis sitting EOB  Ambulation/Gait Ambulation/Gait assistance: Min guard Ambulation Distance (Feet): 120 Feet Assistive device:  Rolling walker (2 wheeled) Gait Pattern/deviations: Step-to pattern;Trunk flexed;Decreased stride length;Wide base of support     General Gait Details: R foot externally rotated and wearing DARCO shoe kicking walker with shoe about 25% of the time,  assist to manage wound vac line and for safety   Stairs            Wheelchair Mobility    Modified Rankin (Stroke Patients Only)       Balance Overall balance assessment: Needs assistance;History of Falls   Sitting balance-Leahy Scale: Good       Standing balance-Leahy Scale: Poor Standing balance comment: relies on BUE support                            Cognition Arousal/Alertness: Awake/alert Behavior During Therapy: WFL for tasks assessed/performed Overall Cognitive Status: Within Functional Limits for tasks assessed                                        Exercises      General Comments General comments (skin integrity, edema, etc.): Patient related doesn't feel safe to go home with wife assist due to "needing a professional" to deal with wound and antibiotics.  Patient states wife works during the day and he would be home alone at d/c during the day      Pertinent Vitals/Pain Pain Assessment: Faces Faces Pain Scale: Hurts little more Pain Location: R foot Pain Descriptors / Indicators: Sore Pain Intervention(s): Monitored  during session    Home Living                      Prior Function            PT Goals (current goals can now be found in the care plan section) Progress towards PT goals: Progressing toward goals    Frequency    Min 3X/week      PT Plan Discharge plan needs to be updated    Co-evaluation              AM-PAC PT "6 Clicks" Daily Activity  Outcome Measure  Difficulty turning over in bed (including adjusting bedclothes, sheets and blankets)?: A Little Difficulty moving from lying on back to sitting on the side of the bed? : A  Little Difficulty sitting down on and standing up from a chair with arms (e.g., wheelchair, bedside commode, etc,.)?: Unable Help needed moving to and from a bed to chair (including a wheelchair)?: A Lot Help needed walking in hospital room?: A Little Help needed climbing 3-5 steps with a railing? : A Lot 6 Click Score: 14    End of Session Equipment Utilized During Treatment: Gait belt;Other (comment)(prosthesis) Activity Tolerance: Patient tolerated treatment well Patient left: in chair;with call bell/phone within reach   PT Visit Diagnosis: Other abnormalities of gait and mobility (R26.89);Difficulty in walking, not elsewhere classified (R26.2);Muscle weakness (generalized) (M62.81)     Time: 3832-9191 PT Time Calculation (min) (ACUTE ONLY): 26 min  Charges:  $Gait Training: 23-37 mins                    G CodesMagda Kiel, [place] 09/12/2017    Reginia Naas 09/12/2017, 1:39 PM

## 2017-09-12 NOTE — Care Management Important Message (Signed)
Important Message  Patient Details IM Letter given to Rhonda/Case Manager to present to the Patient Name: Mark [name] MRN: [id number] Date of Birth: 1951-02-01   Medicare Important Message Given:  Yes    Kerin Salen 09/12/2017, 11:30 AM

## 2017-09-12 NOTE — Progress Notes (Addendum)
Pharmacy Antibiotic Note  Mark [name] is a 67 y.o. male admitted on 08/31/2017 with diabetic foot infection and osteomyelitis.  Pharmacy has been consulted for daptomycin dosing.   Significant events:  1/4 Ortho 4th/5th ray amputation  OR culture 1/4: MRSA  1/10 VAC change and I&D in OR - additional tissue had to be debrided, poor wound healing; ID suspects source control not fully achieved  Baseline CK 34  1/11 Chg to dapto to avoid kidney injury  Today, 09/12/2017:  Remains afebrile, WBC improved to near-normal  CKD4 w/ SCr stable  Ready for discharge per Ortho and ID; needs central access line placed  Plan:  Continue daptomycin 800 mg/kg IV q48 hr (8 mg//kg per ID recs); normally would not reduce frequency unless CrCl < 30, but given length of therapy and baseline SCr that lies between 2.7 and 3.0, feel q48h dosing is reasonable in this situation Per ID, continue for 6 weeks daptomycin; stop date 10/20/17 Check CK, SCr per OPAT orders IV antibiotic discharge orders are pended. To discharging provider:  please sign these orders via discharge navigator,  Select New Orders & click on the button choice - Manage This Unsigned Work.   Height: 6\' 2"  (188 cm) Weight: 235 lb 3.7 oz (106.7 kg) IBW/kg (Calculated) : 82.2  Temp (24hrs), Avg:98.7 F (37.1 C), Min:98.7 F (37.1 C), Max:98.8 F (37.1 C)  Recent Labs  Lab 09/06/17 0517  09/08/17 0535 09/09/17 0606 09/10/17 0558 09/11/17 0705 09/12/17 0551  WBC 25.1*   < > 21.7* 18.5* 17.2* 15.4* 12.6*  CREATININE 3.19*   < > 2.82* 2.68* 2.77* 2.75* 2.66*  VANCOTROUGH 17  --   --   --   --   --   --    < > = values in this interval not displayed.    Estimated Creatinine Clearance: 36 mL/min (A) (by C-G formula based on SCr of 2.66 mg/dL (H)).    No Known Allergies  Antimicrobials this admission:  1/2 Vancomycin >>1/11 1/2 Cefepime >> 1/8 1/2 Flagyl >>1/8 1/11 dapto>>  Microbiology results:  1/2 BCx x 3: NGF 1/3  MRSA PCR: Positive 1/4 R toe (doesn't specify if soft tissue, bone, or other fluid): MRSA  Thank you for allowing pharmacy to be a part of this patient's care.   Reuel Boom, PharmD, BCPS Pager: [phone] 09/12/2017, 11:03 AM

## 2017-09-13 ENCOUNTER — Inpatient Hospital Stay (HOSPITAL_COMMUNITY): Payer: Medicare Other

## 2017-09-13 LAB — CBC
HCT: 25.9 % — ABNORMAL LOW (ref 39.0–52.0)
Hemoglobin: 8.3 g/dL — ABNORMAL LOW (ref 13.0–17.0)
MCH: 29.3 pg (ref 26.0–34.0)
MCHC: 32 g/dL (ref 30.0–36.0)
MCV: 91.5 fL (ref 78.0–100.0)
Platelets: 868 10*3/uL — ABNORMAL HIGH (ref 150–400)
RBC: 2.83 MIL/uL — ABNORMAL LOW (ref 4.22–5.81)
RDW: 14 % (ref 11.5–15.5)
WBC: 12 10*3/uL — ABNORMAL HIGH (ref 4.0–10.5)

## 2017-09-13 LAB — BASIC METABOLIC PANEL
Anion gap: 8 (ref 5–15)
BUN: 42 mg/dL — ABNORMAL HIGH (ref 6–20)
CO2: 23 mmol/L (ref 22–32)
Calcium: 8.2 mg/dL — ABNORMAL LOW (ref 8.9–10.3)
Chloride: 109 mmol/L (ref 101–111)
Creatinine, Ser: 2.84 mg/dL — ABNORMAL HIGH (ref 0.61–1.24)
GFR calc Af Amer: 25 mL/min — ABNORMAL LOW (ref >60)
GFR calc non Af Amer: 22 mL/min — ABNORMAL LOW (ref >60)
Glucose, Bld: 138 mg/dL — ABNORMAL HIGH (ref 65–99)
Potassium: 4.9 mmol/L (ref 3.5–5.1)
Sodium: 140 mmol/L (ref 135–145)

## 2017-09-13 LAB — GLUCOSE, CAPILLARY
Glucose-Capillary: 124 mg/dL — ABNORMAL HIGH (ref 65–99)
Glucose-Capillary: 191 mg/dL — ABNORMAL HIGH (ref 65–99)
Glucose-Capillary: 103 mg/dL — ABNORMAL HIGH (ref 65–99)
Glucose-Capillary: 166 mg/dL — ABNORMAL HIGH (ref 65–99)

## 2017-09-13 MED ORDER — LINEZOLID 600 MG PO TABS: 600.0000 mg | ORAL_TABLET | Freq: Two times a day (BID) | ORAL | 0 refills | Status: DC

## 2017-09-13 MED ORDER — HYDROCODONE-ACETAMINOPHEN 5-325 MG PO TABS: 1.0000 | ORAL_TABLET | Freq: Four times a day (QID) | ORAL | 0 refills | Status: DC | PRN

## 2017-09-13 NOTE — Progress Notes (Addendum)
Per [facility] care, [employer] wound vac is being ordered for it to be delivered tomorrow(1/16) to the facility. Please call Evette at [facility] prior to patient discharge.

## 2017-09-13 NOTE — NC FL2 (Signed)
[place] MEDICAID FL2 LEVEL OF CARE SCREENING TOOL     IDENTIFICATION  Patient Name: Mark [name] Birthdate: October 07, 1950 Sex: male Admission Date (Current Location): 08/31/2017  [facility] and [place] Number:  Herbalist and Address:  [facility],  [address] [place], [place]      Provider Number: [id number]  Attending Physician Name and Address:  Patrecia Pour, MD  Relative Name and Phone Number:       Current Level of Care: Hospital Recommended Level of Care: [place] Prior Approval Number:    Date Approved/Denied: 09/13/17 PASRR Number:  [id number]   Discharge Plan: SNF    Current Diagnoses: Patient Active Problem List   Diagnosis Date Noted  . Diabetic foot ulcer ([place]) 08/31/2017  . Acute blood loss as cause of postoperative anemia 01/02/2017  . Hip fracture ([place]) 01/01/2017  . Impingement syndrome of right shoulder 10/25/2016  . Incisional hernia 07/14/2016  . IDA (iron deficiency anemia) 03/08/2016  . Seizure disorder ([place]) 01/01/2016  . History of Clostridium difficile colitis 01/01/2016  . Positive for microalbuminuria 08/18/2015  . CKD (chronic kidney disease) stage 3, GFR 30-59 ml/min (HCC) 08/18/2015  . GERD (gastroesophageal reflux disease) 04/30/2015  . Tinea pedis 04/30/2015  . Onychomycosis of toenail 04/30/2015  . Malnutrition of moderate degree ([place]) 04/17/2015  . Diabetes mellitus, type 2 ([place]) 04/16/2015  . Chronic diastolic CHF (congestive heart failure) ([place]) 10/18/2014  . Essential hypertension 04/08/2014  . S/P BKA (below knee amputation) ([place]) 11/21/2013    Orientation RESPIRATION BLADDER Height & Weight     Self, Time, Situation, Place  Normal Continent Weight: 233 lb 7.5 oz (105.9 kg) Height:  6\' 2"  (188 cm)  BEHAVIORAL SYMPTOMS/MOOD NEUROLOGICAL BOWEL NUTRITION STATUS      Continent Diet(See dc summary)  AMBULATORY STATUS COMMUNICATION OF NEEDS Skin   Extensive Assist Verbally  Surgical wounds(Insicions, right foot)                       Personal Care Assistance Level of Assistance  Bathing, Feeding, Dressing Bathing Assistance: Limited assistance Feeding assistance: Independent Dressing Assistance: Limited assistance     Functional Limitations Info  Sight, Hearing, Speech Sight Info: Adequate Hearing Info: Adequate Speech Info: Adequate    SPECIAL CARE FACTORS FREQUENCY  PT (By licensed PT), OT (By licensed OT)     PT Frequency: 5x/week OT Frequency: 5x/week            Contractures Contractures Info: Not present    Additional Factors Info  Code Status, Allergies, Isolation Precautions Code Status Info: Full Allergies Info: NKA     Isolation Precautions Info: MRSA     Current Medications (09/13/2017):  This is the current hospital active medication list Current Facility-Administered Medications  Medication Dose Route Frequency Provider Last Rate Last Dose  . acetaminophen (TYLENOL) tablet 650 mg  650 mg Oral Q6H PRN Barton Dubois, MD   650 mg at 09/09/17 6579   Or  . acetaminophen (TYLENOL) suppository 650 mg  650 mg Rectal Q6H PRN Barton Dubois, MD      . amLODipine (NORVASC) tablet 10 mg  10 mg Oral Daily Barton Dubois, MD   10 mg at 09/12/17 0383  . aspirin EC tablet 81 mg  81 mg Oral Daily Barton Dubois, MD   81 mg at 09/12/17 3383  . atorvastatin (LIPITOR) tablet 20 mg  20 mg Oral Daily Barton Dubois, MD   20 mg at 09/12/17  3762  . bisacodyl (DULCOLAX) EC tablet 5 mg  5 mg Oral Daily PRN Barton Dubois, MD   5 mg at 09/09/17 2239  . bisacodyl (DULCOLAX) suppository 10 mg  10 mg Rectal Daily PRN Patrecia Pour, MD      . carvedilol (COREG) tablet 12.5 mg  12.5 mg Oral BID WC Barton Dubois, MD   12.5 mg at 09/12/17 1757  . DAPTOmycin (CUBICIN) 800 mg in sodium chloride 0.9 % IVPB  800 mg Intravenous Q48H Patrecia Pour, MD   Stopped at 09/12/17 1828  . heparin injection 5,000 Units  5,000 Units Subcutaneous Q8H Patrecia Pour, MD    5,000 Units at 09/13/17 [phone]  . HYDROcodone-acetaminophen (NORCO/VICODIN) 5-325 MG per tablet 1-2 tablet  1-2 tablet Oral Q6H PRN [name], MD   1 tablet at 09/12/17 2315  . insulin aspart (novoLOG) injection 0-15 Units  0-15 Units Subcutaneous TID WC Barton Dubois, MD   3 Units at 09/12/17 1336  . insulin aspart (novoLOG) injection 0-5 Units  0-5 Units Subcutaneous QHS Barton Dubois, MD   2 Units at 09/05/17 2201  . insulin glargine (LANTUS) injection 15 Units  15 Units Subcutaneous Daily Hosie Poisson, MD   15 Units at 09/12/17 1058  . lacosamide (VIMPAT) tablet 200 mg  200 mg Oral BID Barton Dubois, MD   200 mg at 09/12/17 2134  . morphine 4 MG/ML injection 1-2 mg  1-2 mg Intravenous Q4H PRN Hosie Poisson, MD   2 mg at 09/10/17 0815  . multivitamin with minerals tablet 1 tablet  1 tablet Oral Daily Hosie Poisson, MD   1 tablet at 09/12/17 0852  . mupirocin ointment (BACTROBAN) 2 %   Nasal BID Hosie Poisson, MD   1 application at 17/61/60 2134  . ondansetron (ZOFRAN) tablet 4 mg  4 mg Oral Q6H PRN Barton Dubois, MD       Or  . ondansetron [facility]) injection 4 mg  4 mg Intravenous Q6H PRN Barton Dubois, MD   4 mg at 09/02/17 1841  . polyethylene glycol (MIRALAX / GLYCOLAX) packet 17 g  17 g Oral BID Patrecia Pour, MD   17 g at 09/11/17 2110  . promethazine (PHENERGAN) injection 12.5 mg  12.5 mg Intravenous Q6H PRN Hosie Poisson, MD   12.5 mg at 09/02/17 1205  . protein supplement (PREMIER PROTEIN) liquid  11 oz Oral BID BM Hosie Poisson, MD   11 oz at 09/09/17 1427     Discharge Medications: Please see discharge summary for a list of discharge medications.  Relevant Imaging Results:  Relevant Lab Results:   Additional Information [id number]  Servando Snare, LCSW

## 2017-09-13 NOTE — Progress Notes (Signed)
LCSW following for SNF placement.  LCSW completed assessment with patient on 1/9. At that time patient was planning to go home with wife.   According to most recent PT note patient would like SNF at dc.   LCSW will follow for SNF placement. Patient needs bed offers. Patient will require pre auth for SNF placement.   Carolin Coy [facility] [place]

## 2017-09-13 NOTE — Discharge Summary (Signed)
Physician Discharge Summary  JAIMIN KRUPKA BWG:[id number] DOB: Aug 15, 1951 DOA: 08/31/2017  PCP: [name], MD  Admit date: 08/31/2017 Discharge date: 09/13/2017  Admitted From: Home Disposition: SNF   Recommendations for Outpatient Follow-up:  1. Follow up with PCP in 1-2 weeks 2. Please continue linezolid to complete 6 weeks of therapy for MRSA osteomyelitis (stop date 10/20/2017). Will need weekly CBC faxed to [phone]. Check ESR with final lab draw. Platelets currently 868.  Home Health: N/A Equipment/Devices: Vac Discharge Condition: Stable CODE STATUS: Full Diet recommendation: Heart healthy, carb-modified  Brief/Interim Summary: Mark [name] is a 67 y.o. male with a history of HTN, T2DM, stage III CKD, chronic HFpEF, PVD s/p left BKA, and seizure disorder who presented to the ED for wound on right foot with fever about 1.5 weeks after having a corn removed from the 4th toe by podiatry as an outpatient. MRI showed diffuse cellulitis, myofasciitis and fluid collections along 4th digit w/associated osteomyelitis. Orthopedics was consulted and he underwent right fourth and fifth ray amputation with VAC application on 05/01/3299 by Dr. Lorin Mercy. Taken back to OR for vac change 1/10, requiring further debridement. Vancomycin switched to daptomycin 1/11 per ID recommendations, and PT recommended discharge to SNF for rehabilitation. Unfortunately, SNF would not accept patient on this antibiotics, so ID has recommended linezolid for the same duration with close monitoring of platelets.  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 Lorin Mercy; culture grew MRSA, blood cultures negative. Leukocytosis is improving but remains elevated. Repeat I&D 1/10.   - Continue linezolid x6 weeks (stop date 10/20/2017). Will need weekly CBC faxed to [phone]. Check ESR with final lab draw. - WBAT w/post-op shoe per ortho. Follow up with Dr. Lorin Mercy  - Continue wound vac. - Pain controlled as ordered.   Acute on chronic kidney disease stage III: Due to ATN (FENa elevated at 2) due to infection. No hydro on U/S. Overall trend over past 18 months indicates progressively worsening CKD.  - Improving, feel we are at his baseline ~2.7.  - Monitor and renally dose medications, avoiding nephrotoxins.  T2DM: Has remained at inpatient goal. HbA1c 7.1%.  - Continue lantus, SSI  Chronic diastolic heart failure: Euvolemic.  - Continue home medications including norvasc, lipitor, coreg  Hypertension: well-controlled. - No change in meds.   Anemia of chronic disease: Hgb stable. Anemia panel mixed w/low iron and TIBC with elevated ferritin due to acute phase reactant elevation.  - Would likely benefit from iron as an outpatient following acute infection.  Seizure disorder: Stable - Continue vimpat.   Constipation: Resolved. - Continue preventive regimen  Discharge Instructions Discharge Instructions    Diet - low sodium heart healthy   Complete by:  As directed    Diet Carb Modified   Complete by:  As directed    Increase activity slowly   Complete by:  As directed      Allergies as of 09/13/2017   No Known Allergies     Medication List    TAKE  these medications   ACCU-CHEK AVIVA PLUS w/Device Kit Use as prescribed TID before meals and QHS   acetaminophen 325 MG tablet Commonly known as:  TYLENOL Take 2 tablets (650 mg total) by mouth every 6 (six) hours as needed for mild pain (or Fever >/= 101). What  changed:    how much to take  when to take this   amLODipine 10 MG tablet Commonly known as:  NORVASC Take 1 tablet (10 mg total) daily by mouth.   aspirin EC 81 MG tablet Take 1 tablet (81 mg total) by mouth daily.   atorvastatin 20 MG tablet Commonly known as:  LIPITOR TAKE 1 TABLET BY MOUTH  DAILY What changed:    how much to take  how to take this  when to take this   B-D UF III MINI PEN NEEDLES 31G X 5 MM Misc Generic drug:  Insulin Pen Needle USE TO INJECT INSULIN AS DIRECTED BY PHYSICIAN   bisacodyl 5 MG EC tablet Commonly known as:  DULCOLAX Take 5 mg by mouth daily as needed for moderate constipation.   carvedilol 12.5 MG tablet Commonly known as:  COREG TAKE 1 TABLET BY MOUTH 2 TIMES DAILY WITH A MEAL. What changed:    how much to take  how to take this  when to take this  additional instructions   ferrous gluconate 324 MG tablet Commonly known as:  FERGON Take 1 tablet (324 mg total) by mouth 2 (two) times daily with a meal.   furosemide 20 MG tablet Commonly known as:  LASIX Take 1 tablet (20 mg total) daily by mouth.   glucose blood test strip Commonly known as:  ACCU-CHEK AVIVA PLUS 1 each by Other route 3 (three) times daily.   HYDROcodone-acetaminophen 5-325 MG tablet Commonly known as:  NORCO/VICODIN Take 1-2 tablets by mouth every 6 (six) hours as needed for moderate pain.   Insulin Glargine 100 UNIT/ML Solostar Pen Commonly known as:  LANTUS SOLOSTAR Inject 10 Units into the skin every morning. What changed:  when to take this   INSULIN SYRINGE .5CC/30GX5/16" 30G X 5/16" 0.5 ML Misc Check blood sugar TID & QHS   lacosamide 200 MG Tabs tablet Commonly known as:  VIMPAT Take 1 tablet (200 mg total) by mouth 2 (two) times daily.   linezolid 600 MG tablet Commonly known as:  ZYVOX Take 1 tablet (600 mg total) by mouth 2 (two) times daily.   polyethylene glycol packet Commonly known as:  MIRALAX / GLYCOLAX Take 17 g by mouth  daily as needed for moderate constipation.      Follow-up Information    [name], MD Follow up in 2 week(s).   Specialty:  Orthopedic Surgery Contact information: [place] [address] [phone]        [name], MD Follow up.   Specialty:  Internal Medicine Contact information: [place] [address] [phone]          No Known Allergies  Consultations:  Orthopedics, Dr. Lorin Mercy  ID, Dr. Baxter Flattery  Procedures/Studies: Dg Chest 2 View  Result Date: 08/31/2017 CLINICAL DATA:  Shortness of breath, fever, and cough for the past 4 days. EXAM: CHEST  2 VIEW COMPARISON:  Chest x-ray dated Dec 31, 2016. FINDINGS: The cardiomediastinal silhouette is normal in size. Slightly coarsened interstitial markings, similar to prior study. Minimal left basilar atelectasis. No focal consolidation, pleural effusion, or pneumothorax. No acute osseous abnormality. IMPRESSION: No active cardiopulmonary  disease. Electronically Signed   By: Titus Dubin M.D.   On: 08/31/2017 09:25   US Renal  Result Date: 09/03/2017 CLINICAL DATA:  Acute kidney injury EXAM: RENAL / URINARY TRACT ULTRASOUND COMPLETE COMPARISON:  CT abdomen/ pelvis 06/23/2016 FINDINGS: Right Kidney: Length: 12 cm. Echogenicity within normal limits. 8.2 mm lower pole echogenic focus with posterior acoustic shadowing consistent with a renal calculus. No mass or hydronephrosis visualized. Left Kidney: Length: 11.7 cm. Echogenicity within normal limits. 2.8 cm anechoic left upper pole renal mass most consistent with cysts. No hydronephrosis visualized. Bladder: Appears normal for degree of bladder distention. IMPRESSION: 1. No obstructive uropathy. 2. Nonobstructing right renal calculus. Electronically Signed   By: Kathreen Devoid   On: 09/03/2017 14:07   Mr Foot Right Wo Contrast  Result Date: 09/01/2017 CLINICAL DATA:  Red, painful swollen right foot and drainage. EXAM: MRI OF THE  RIGHT FOREFOOT WITHOUT CONTRAST TECHNIQUE: Multiplanar, multisequence MR imaging of the right foot was performed. No intravenous contrast was administered. COMPARISON:  Radiographs 08/31/2016 FINDINGS: Bones/Joint/Cartilage Abnormal T1 and T2 signal intensity in the proximal and middle phalanges of the fourth toe consistent with osteomyelitis. The fourth metatarsal demonstrates normal signal intensity. There is a small joint effusion at the MTP joint but no obvious changes of septic arthritis. Diffuse abnormal subcutaneous soft tissue swelling/ edema/ fluid suggesting cellulitis. Skin blisters are noted along the dorsum of the toe. Fluid collections along the plantar aspect of the fourth digit could suggest soft tissue abscesses. Diffuse myofasciitis without definite findings for pyomyositis. IMPRESSION: 1. Diffuse cellulitis and myofasciitis. 2. Fluid collections along the dorsum of the forefoot along the fourth digit could be small abscesses. 3. Osteomyelitis involving the proximal and middle phalanges of the fourth toe Electronically Signed   By: Marijo Sanes M.D.   On: 09/01/2017 07:19   Dg Foot Complete Right  Result Date: 08/31/2017 CLINICAL DATA:  Diabetic foot ulcer. EXAM: RIGHT FOOT COMPLETE - 3+ VIEW COMPARISON:  04/14/2016 FINDINGS: No acute fracture or dislocations identified. Arthritic changes involving the first metatarsal phalanges joint and at the IP joint of the great toe noted. Again noted is an erosion of the tuft of the distal phalanx of the second toe which may represent an epidermoid inclusion cyst. 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 IMPRESSION: 1. No acute abnormality. 2. Possible epidermoid inclusion cyst involving the tuft of the distal phalanx of the second toe. Electronically Signed   By: Kerby Moors M.D.   On: 08/31/2017 10:24     09/02/2017 4th/5th ray amputation right foot. - Dr. Lorin Mercy  09/08/2017 Sharp excisional debridement of right foot wound,VAC change.  - Dr. Lorin Mercy.  Antimicrobials:  Vancomycin 1/2 - 1/11   Cefepime, flagyl 1/2 - 1/7  Daptomycin 1/11 - 1/15  Linezolid 1/15 - 10/20/2017  Subjective: No fevers, pain controlled, ready to leave the hospital.   Discharge Exam: Vitals:   09/12/17 2122 09/13/17 0634  BP: (!) 157/78 134/66  Pulse: 71 69  Resp: 15 15  Temp: 98.5 F (36.9 C) 98.4 F (36.9 C)  SpO2: 97% 97%   Gen: 67 y.o. male in no distress Pulm: Non-labored breathing room air. Clear to auscultation bilaterally.  CV: Regular rate and rhythm. No murmur, rub, or gallop. No JVD. GI: Abdomen soft, non-tender, benign, non-distended, with normoactive bowel sounds. No masses or palpable stool burden. Ext: Left BKA, right foot wrapped in ACE wrap, vac in place with good seal. No edema in RLE. Sensation in tact at toes,  motor function 5/5 in toes of right foot. Full ROM at hip.  Skin: As above. Neuro: Alert and oriented. No focal neurological deficits. Psych: Judgement and insight appear normal. Mood & affect appropriate.   Labs: Basic Metabolic Panel: Recent Labs  Lab 09/09/17 0606 09/10/17 0558 09/11/17 0705 09/12/17 0551 09/13/17 0649  NA 141 139 139 141 140  K 4.8 5.0 5.0 4.9 4.9  CL 115* 111 112* 111 109  CO2 21* 22 21* 23 23  GLUCOSE 188* 140* 163* 115* 138*  BUN 47* 48* 44* 38* 42*  CREATININE 2.68* 2.77* 2.75* 2.66* 2.84*  CALCIUM 8.1* 8.3* 8.4* 8.5* 8.2*   CBC: Recent Labs  Lab 09/09/17 0606 09/10/17 0558 09/11/17 0705 09/12/17 0551 09/13/17 0649  WBC 18.5* 17.2* 15.4* 12.6* 12.0*  HGB 8.0* 8.2* 8.8* 8.7* 8.3*  HCT 24.7* 25.6* 27.7* 26.9* 25.9*  MCV 90.8 91.1 91.4 91.2 91.5  PLT 744* 808* 914* 928* 868*   Cardiac Enzymes: Recent Labs  Lab 09/09/17 0606  CKTOTAL 34*   CBG: Recent Labs  Lab 09/12/17 1154 09/12/17 1651 09/12/17 2058 09/13/17 0742 09/13/17 1144  GLUCAP 172* 119* 127* 124* 191*   Urinalysis    Component Value Date/Time   COLORURINE YELLOW 08/31/2017 0818    APPEARANCEUR CLOUDY (A) 08/31/2017 0818   LABSPEC 1.015 08/31/2017 0818   PHURINE 5.0 08/31/2017 0818   GLUCOSEU 50 (A) 08/31/2017 0818   HGBUR SMALL (A) 08/31/2017 0818   BILIRUBINUR NEGATIVE 08/31/2017 0818   KETONESUR NEGATIVE 08/31/2017 0818   PROTEINUR 100 (A) 08/31/2017 0818   UROBILINOGEN 0.2 04/18/2015 1544   NITRITE NEGATIVE 08/31/2017 0818   LEUKOCYTESUR NEGATIVE 08/31/2017 0818    Time coordinating discharge: Approximately 40 minutes  Vance Gather, MD  [facility] 09/13/2017, 2:09 PM Pager [phone]

## 2017-09-14 ENCOUNTER — Telehealth: Payer: Self-pay | Admitting: Internal Medicine

## 2017-09-14 DIAGNOSIS — E1151 Type 2 diabetes mellitus with diabetic peripheral angiopathy without gangrene: Secondary | ICD-10-CM | POA: Diagnosis not present

## 2017-09-14 DIAGNOSIS — N183 Chronic kidney disease, stage 3 (moderate): Secondary | ICD-10-CM | POA: Diagnosis not present

## 2017-09-14 DIAGNOSIS — E7849 Other hyperlipidemia: Secondary | ICD-10-CM | POA: Diagnosis not present

## 2017-09-14 DIAGNOSIS — N179 Acute kidney failure, unspecified: Secondary | ICD-10-CM

## 2017-09-14 DIAGNOSIS — M6281 Muscle weakness (generalized): Secondary | ICD-10-CM | POA: Diagnosis not present

## 2017-09-14 DIAGNOSIS — I5032 Chronic diastolic (congestive) heart failure: Secondary | ICD-10-CM

## 2017-09-14 DIAGNOSIS — K219 Gastro-esophageal reflux disease without esophagitis: Secondary | ICD-10-CM | POA: Diagnosis not present

## 2017-09-14 DIAGNOSIS — E1122 Type 2 diabetes mellitus with diabetic chronic kidney disease: Secondary | ICD-10-CM | POA: Diagnosis not present

## 2017-09-14 DIAGNOSIS — E785 Hyperlipidemia, unspecified: Secondary | ICD-10-CM | POA: Diagnosis not present

## 2017-09-14 DIAGNOSIS — M86172 Other acute osteomyelitis, left ankle and foot: Secondary | ICD-10-CM | POA: Diagnosis not present

## 2017-09-14 DIAGNOSIS — Z89421 Acquired absence of other right toe(s): Secondary | ICD-10-CM | POA: Diagnosis not present

## 2017-09-14 DIAGNOSIS — L02611 Cutaneous abscess of right foot: Secondary | ICD-10-CM

## 2017-09-14 DIAGNOSIS — Z89512 Acquired absence of left leg below knee: Secondary | ICD-10-CM

## 2017-09-14 DIAGNOSIS — I10 Essential (primary) hypertension: Secondary | ICD-10-CM | POA: Diagnosis not present

## 2017-09-14 DIAGNOSIS — E11621 Type 2 diabetes mellitus with foot ulcer: Secondary | ICD-10-CM

## 2017-09-14 DIAGNOSIS — T8189XA Other complications of procedures, not elsewhere classified, initial encounter: Secondary | ICD-10-CM | POA: Diagnosis not present

## 2017-09-14 DIAGNOSIS — R05 Cough: Secondary | ICD-10-CM

## 2017-09-14 DIAGNOSIS — Z794 Long term (current) use of insulin: Secondary | ICD-10-CM

## 2017-09-14 DIAGNOSIS — D649 Anemia, unspecified: Secondary | ICD-10-CM | POA: Diagnosis not present

## 2017-09-14 DIAGNOSIS — L97518 Non-pressure chronic ulcer of other part of right foot with other specified severity: Secondary | ICD-10-CM

## 2017-09-14 DIAGNOSIS — E1159 Type 2 diabetes mellitus with other circulatory complications: Secondary | ICD-10-CM | POA: Diagnosis not present

## 2017-09-14 DIAGNOSIS — A419 Sepsis, unspecified organism: Secondary | ICD-10-CM | POA: Diagnosis not present

## 2017-09-14 DIAGNOSIS — L97509 Non-pressure chronic ulcer of other part of unspecified foot with unspecified severity: Secondary | ICD-10-CM | POA: Diagnosis not present

## 2017-09-14 DIAGNOSIS — G40909 Epilepsy, unspecified, not intractable, without status epilepticus: Secondary | ICD-10-CM

## 2017-09-14 DIAGNOSIS — S72001D Fracture of unspecified part of neck of right femur, subsequent encounter for closed fracture with routine healing: Secondary | ICD-10-CM | POA: Diagnosis not present

## 2017-09-14 DIAGNOSIS — M726 Necrotizing fasciitis: Secondary | ICD-10-CM | POA: Diagnosis not present

## 2017-09-14 DIAGNOSIS — R262 Difficulty in walking, not elsewhere classified: Secondary | ICD-10-CM | POA: Diagnosis not present

## 2017-09-14 DIAGNOSIS — A4189 Other specified sepsis: Secondary | ICD-10-CM | POA: Diagnosis not present

## 2017-09-14 DIAGNOSIS — A4902 Methicillin resistant Staphylococcus aureus infection, unspecified site: Secondary | ICD-10-CM | POA: Diagnosis not present

## 2017-09-14 DIAGNOSIS — L0390 Cellulitis, unspecified: Secondary | ICD-10-CM | POA: Diagnosis not present

## 2017-09-14 DIAGNOSIS — M86271 Subacute osteomyelitis, right ankle and foot: Secondary | ICD-10-CM | POA: Diagnosis not present

## 2017-09-14 LAB — BASIC METABOLIC PANEL
Anion gap: 7 (ref 5–15)
BUN: 39 mg/dL — ABNORMAL HIGH (ref 6–20)
CO2: 22 mmol/L (ref 22–32)
Calcium: 8.6 mg/dL — ABNORMAL LOW (ref 8.9–10.3)
Chloride: 111 mmol/L (ref 101–111)
Creatinine, Ser: 2.83 mg/dL — ABNORMAL HIGH (ref 0.61–1.24)
GFR calc Af Amer: 25 mL/min — ABNORMAL LOW (ref >60)
GFR calc non Af Amer: 22 mL/min — ABNORMAL LOW (ref >60)
Glucose, Bld: 150 mg/dL — ABNORMAL HIGH (ref 65–99)
Potassium: 5.2 mmol/L — ABNORMAL HIGH (ref 3.5–5.1)
Sodium: 140 mmol/L (ref 135–145)

## 2017-09-14 LAB — GLUCOSE, CAPILLARY
Glucose-Capillary: 130 mg/dL — ABNORMAL HIGH (ref 65–99)
Glucose-Capillary: 129 mg/dL — ABNORMAL HIGH (ref 65–99)
Glucose-Capillary: 127 mg/dL — ABNORMAL HIGH (ref 65–99)

## 2017-09-14 LAB — CBC
HCT: 25.9 % — ABNORMAL LOW (ref 39.0–52.0)
Hemoglobin: 7.9 g/dL — ABNORMAL LOW (ref 13.0–17.0)
MCH: 28 pg (ref 26.0–34.0)
MCHC: 30.5 g/dL (ref 30.0–36.0)
MCV: 91.8 fL (ref 78.0–100.0)
Platelets: 984 10*3/uL (ref 150–400)
RBC: 2.82 MIL/uL — ABNORMAL LOW (ref 4.22–5.81)
RDW: 14.2 % (ref 11.5–15.5)
WBC: 10.8 10*3/uL — ABNORMAL HIGH (ref 4.0–10.5)

## 2017-09-14 MED ORDER — LINEZOLID 600 MG PO TABS: 600.0000 mg | ORAL_TABLET | Freq: Two times a day (BID) | ORAL | 0 refills | Status: AC

## 2017-09-14 NOTE — Clinical Social Work Placement (Signed)
   12:25 PM Patient chose bed at [facility].  LCSW confirmed bed with facility.  LCSW confirmed wound vac with facility.   Patient will transport by car. Family can transport after 2pm.  LCSW faxed dc docs to facility.   RN report # [id number]  CLINICAL SOCIAL WORK PLACEMENT  NOTE  Date:  09/14/2017  Patient Details  Name: Mark [name] MRN: [id number] Date of Birth: [birth year]  Clinical Social Work is seeking post-discharge placement for this patient at the [place] level of care (*CSW will initial, date and re-position this form in  chart as items are completed):  Yes   Patient/family provided with [place] Work Department's list of facilities offering this level of care within the geographic area requested by the patient (or if unable, by the patient's family).  Yes   Patient/family informed of their freedom to choose among providers that offer the needed level of care, that participate in Medicare, Medicaid or managed care program needed by the patient, have an available bed and are willing to accept the patient.  Yes   Patient/family informed of [facility]'s ownership interest in [facility] and [facility], as well as of the fact that they are under no obligation to receive care at these facilities.  PASRR submitted to EDS on       PASRR number received on 09/13/17     Existing PASRR number confirmed on       FL2 transmitted to all facilities in geographic area requested by pt/family on 09/13/17     FL2 transmitted to all facilities within larger geographic area on       Patient informed that his/her managed care company has contracts with or will negotiate with certain facilities, including the following:        Yes   Patient/family informed of bed offers received.  Patient chooses bed at [facility]     Physician recommends and patient chooses bed at [facility]    Patient to be  transferred to [facility] on 09/14/17.  Patient to be transferred to facility by Family     Patient family notified on 09/14/17 of transfer.  Name of family member notified:  Patient contacted family     PHYSICIAN       Additional Comment:    _______________________________________________ Servando Snare, LCSW 09/14/2017, 12:25 PM

## 2017-09-14 NOTE — Telephone Encounter (Signed)
I called pt tp try to schedule an Hospital follow per Dr. Wynetta Emery request, LVM

## 2017-09-14 NOTE — Discharge Summary (Addendum)
Physician Discharge Summary  Mark [name] KTG:[id number] DOB: September 27, 1950 DOA: 08/31/2017  PCP: [name], MD  Admit date: 08/31/2017 Discharge date: 09/14/2017  Admitted From: Home Disposition: SNF   Recommendations for Outpatient Follow-up:  1. Follow up with PCP in 1-2 weeks 2. Please continue linezolid to complete 6 weeks of therapy for MRSA osteomyelitis (stop date 10/20/2017). Will need weekly CBC faxed to [phone]. Check ESR with final lab draw. Platelets currently 868.  Home Health: N/A Equipment/Devices: Vac Discharge Condition: Stable CODE STATUS: Full Diet recommendation: Heart healthy, carb-modified  Update: Patient was kept overnight and continued on IV antibiotics and wound vac drainage. Wound vac ordered to be deliver at [facility] 09/14/17. No acute events overnight. Tolerating his treatment well. No pain reported in his right lower extremity which continues to drain coca cola tinted fluid.  Brief/Interim Summary: Mark [name] is a 67 y.o. male with a history of HTN, T2DM, stage III CKD, chronic HFpEF, PVD s/p left BKA, and seizure disorder who presented to the ED for wound on right foot with fever about 1.5 weeks after having a corn removed from the 4th toe by podiatry as an outpatient. MRI showed diffuse cellulitis, myofasciitis and fluid collections along 4th digit w/associated osteomyelitis. Orthopedics was consulted and he underwent right fourth and fifth ray amputation with VAC application on 10/05/2033 by Dr. Lorin Mercy. Taken back to OR for vac change 1/10, requiring further debridement. Vancomycin switched to daptomycin 1/11 per ID recommendations, and PT recommended discharge to SNF for rehabilitation. Unfortunately, SNF would not accept patient on this antibiotics, so ID has recommended linezolid for the same duration with close monitoring of platelets.  Discharge Diagnoses:  Principal Problem:   Diabetic foot ulcer ([place]) Active Problems:   S/P BKA (below knee  amputation) (HCC)   Essential hypertension   Chronic diastolic CHF (congestive heart failure) (HCC)   Diabetes mellitus, type 2 (HCC)   GERD (gastroesophageal reflux disease)   CKD (chronic kidney disease) stage 3, GFR 30-59 ml/min (HCC)   Seizure disorder (HCC)  Diabetic right foot ulcer, MRSA osteomyelitis and abscess of the fourth toe: s/p fourth and fifth ray amputation right foot, VAC applied 1/4 by Dr. Lorin Mercy; culture grew MRSA, blood cultures negative. Leukocytosis is improving but remains elevated. Repeat I&D 1/10.   - Continue linezolid x6 weeks (stop date 10/20/2017). Will need weekly CBC faxed to [phone]. Check ESR with final lab draw. - WBAT w/post-op shoe per ortho. Follow up with Dr. Lorin Mercy  - Continue wound vac. - Pain controlled as ordered.   Acute on chronic kidney disease stage III: Due to ATN (FENa elevated at 2) due to infection. No hydro on U/S. Overall trend over past 18 months indicates progressively worsening CKD.  - Improving, feel we are at his baseline ~2.7.  - Monitor and renally dose medications, avoiding nephrotoxins.  T2DM: Has remained at inpatient goal. HbA1c 7.1%.  - Continue lantus, SSI  Chronic diastolic heart failure: Euvolemic.  - Continue home medications including norvasc, lipitor, coreg  Hypertension: well-controlled. - No change in meds.   Anemia of chronic disease: Hgb stable. Anemia panel mixed w/low iron and TIBC with elevated ferritin due to acute phase reactant elevation.  - Would likely benefit from iron as an outpatient following acute infection.  Seizure disorder: Stable - Continue vimpat.   Constipation: Resolved. - Continue preventive regimen  Discharge Instructions Discharge Instructions    Diet - low sodium heart healthy   Complete by:  As directed  Diet Carb Modified   Complete by:  As directed    Increase activity slowly   Complete by:  As directed      Allergies as of 09/14/2017   No Known Allergies      Medication List    TAKE these medications   ACCU-CHEK AVIVA PLUS w/Device Kit Use as prescribed TID before meals and QHS   acetaminophen 325 MG tablet Commonly known as:  TYLENOL Take 2 tablets (650 mg total) by mouth every 6 (six) hours as needed for mild pain (or Fever >/= 101). What changed:    how much to take  when to take this   amLODipine 10 MG tablet Commonly known as:  NORVASC Take 1 tablet (10 mg total) daily by mouth.   aspirin EC 81 MG tablet Take 1 tablet (81 mg total) by mouth daily.   atorvastatin 20 MG tablet Commonly known as:  LIPITOR TAKE 1 TABLET BY MOUTH  DAILY What changed:    how much to take  how to take this  when to take this   B-D UF III MINI PEN NEEDLES 31G X 5 MM Misc Generic drug:  Insulin Pen Needle USE TO INJECT INSULIN AS DIRECTED BY PHYSICIAN   bisacodyl 5 MG EC tablet Commonly known as:  DULCOLAX Take 5 mg by mouth daily as needed for moderate constipation.   carvedilol 12.5 MG tablet Commonly known as:  COREG TAKE 1 TABLET BY MOUTH 2 TIMES DAILY WITH A MEAL. What changed:    how much to take  how to take this  when to take this  additional instructions   ferrous gluconate 324 MG tablet Commonly known as:  FERGON Take 1 tablet (324 mg total) by mouth 2 (two) times daily with a meal.   furosemide 20 MG tablet Commonly known as:  LASIX Take 1 tablet (20 mg total) daily by mouth.   glucose blood test strip Commonly known as:  ACCU-CHEK AVIVA PLUS 1 each by Other route 3 (three) times daily.   HYDROcodone-acetaminophen 5-325 MG tablet Commonly known as:  NORCO/VICODIN Take 1-2 tablets by mouth every 6 (six) hours as needed for moderate pain.   Insulin Glargine 100 UNIT/ML Solostar Pen Commonly known as:  LANTUS SOLOSTAR Inject 10 Units into the skin every morning. What changed:  when to take this   INSULIN SYRINGE .5CC/30GX5/16" 30G X 5/16" 0.5 ML Misc Check blood sugar TID & QHS   lacosamide 200 MG Tabs  tablet Commonly known as:  VIMPAT Take 1 tablet (200 mg total) by mouth 2 (two) times daily.   linezolid 600 MG tablet Commonly known as:  ZYVOX Take 1 tablet (600 mg total) by mouth 2 (two) times daily.   polyethylene glycol packet Commonly known as:  MIRALAX / GLYCOLAX Take 17 g by mouth daily as needed for moderate constipation.       Contact information for follow-up providers    [name], MD Follow up in 2 week(s).   Specialty:  Orthopedic Surgery Contact information: [place] [address] [phone]        [name], MD Follow up.   Specialty:  Internal Medicine Contact information: [place] [address] [phone]        Carlyle Basques, MD. Schedule an appointment as soon as possible for a visit in 5 week(s).   Specialty:  Infectious Diseases Contact information: [place] [address] [phone]  Contact information for after-discharge care    [facility] .   Service:  Skilled Nursing Contact information: [address] [place] [phone]                 No Known Allergies  Consultations:  Orthopedics, Dr. Lorin Mercy  ID, Dr. Baxter Flattery  Procedures/Studies: Dg Chest 2 View  Result Date: 08/31/2017 CLINICAL DATA:  Shortness of breath, fever, and cough for the past 4 days. EXAM: CHEST  2 VIEW COMPARISON:  Chest x-ray dated Dec 31, 2016. FINDINGS: The cardiomediastinal silhouette is normal in size. Slightly coarsened interstitial markings, similar to prior study. Minimal left basilar atelectasis. No focal consolidation, pleural effusion, or pneumothorax. No acute osseous abnormality. IMPRESSION: No active cardiopulmonary disease. Electronically Signed   By: Titus Dubin M.D.   On: 08/31/2017 09:25   US Renal  Result Date: 09/03/2017 CLINICAL DATA:  Acute kidney injury EXAM: RENAL /  URINARY TRACT ULTRASOUND COMPLETE COMPARISON:  CT abdomen/ pelvis 06/23/2016 FINDINGS: Right Kidney: Length: 12 cm. Echogenicity within normal limits. 8.2 mm lower pole echogenic focus with posterior acoustic shadowing consistent with a renal calculus. No mass or hydronephrosis visualized. Left Kidney: Length: 11.7 cm. Echogenicity within normal limits. 2.8 cm anechoic left upper pole renal mass most consistent with cysts. No hydronephrosis visualized. Bladder: Appears normal for degree of bladder distention. IMPRESSION: 1. No obstructive uropathy. 2. Nonobstructing right renal calculus. Electronically Signed   By: Kathreen Devoid   On: 09/03/2017 14:07   Mr Foot Right Wo Contrast  Result Date: 09/01/2017 CLINICAL DATA:  Red, painful swollen right foot and drainage. EXAM: MRI OF THE RIGHT FOREFOOT WITHOUT CONTRAST TECHNIQUE: Multiplanar, multisequence MR imaging of the right foot was performed. No intravenous contrast was administered. COMPARISON:  Radiographs 08/31/2016 FINDINGS: Bones/Joint/Cartilage Abnormal T1 and T2 signal intensity in the proximal and middle phalanges of the fourth toe consistent with osteomyelitis. The fourth metatarsal demonstrates normal signal intensity. There is a small joint effusion at the MTP joint but no obvious changes of septic arthritis. Diffuse abnormal subcutaneous soft tissue swelling/ edema/ fluid suggesting cellulitis. Skin blisters are noted along the dorsum of the toe. Fluid collections along the plantar aspect of the fourth digit could suggest soft tissue abscesses. Diffuse myofasciitis without definite findings for pyomyositis. IMPRESSION: 1. Diffuse cellulitis and myofasciitis. 2. Fluid collections along the dorsum of the forefoot along the fourth digit could be small abscesses. 3. Osteomyelitis involving the proximal and middle phalanges of the fourth toe Electronically Signed   By: Marijo Sanes M.D.   On: 09/01/2017 07:19   Dg Foot Complete Right  Result Date:  08/31/2017 CLINICAL DATA:  Diabetic foot ulcer. EXAM: RIGHT FOOT COMPLETE - 3+ VIEW COMPARISON:  04/14/2016 FINDINGS: No acute fracture or dislocations identified. Arthritic changes involving the first metatarsal phalanges joint and at the IP joint of the great toe noted. Again noted is an erosion of the tuft of the distal phalanx of the second toe which may represent an epidermoid inclusion cyst. 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 IMPRESSION: 1. No acute abnormality. 2. Possible epidermoid inclusion cyst involving the tuft of the distal phalanx of the second toe. Electronically Signed   By: Kerby Moors M.D.   On: 08/31/2017 10:24     09/02/2017 4th/5th ray amputation right foot. - Dr. Lorin Mercy  09/08/2017 Sharp excisional debridement of right foot wound,VAC change. - Dr. Lorin Mercy.  Antimicrobials:  Vancomycin 1/2 - 1/11   Cefepime,  flagyl 1/2 - 1/7  Daptomycin 1/11 - 1/15  Linezolid 1/15 - 10/20/2017  Subjective: No fevers, pain controlled, ready to leave the hospital.   Discharge Exam: Vitals:   09/13/17 2142 09/14/17 0557  BP: (!) 143/70 (!) 159/74  Pulse: 65 70  Resp: 16 16  Temp: 98.7 F (37.1 C) 98.6 F (37 C)  SpO2: 98% 96%   Gen: 67 y.o. male in no distress Pulm: Non-labored breathing room air. Clear to auscultation bilaterally.  CV: Regular rate and rhythm. No murmur, rub, or gallop. No JVD. GI: Abdomen soft, non-tender, benign, non-distended, with normoactive bowel sounds. No masses or palpable stool burden. Ext: Left BKA, right foot wrapped in ACE wrap, vac in place with good seal. No edema in RLE. Sensation in tact at toes, motor function 5/5 in toes of right foot. Full ROM at hip.  Skin: As above. Neuro: Alert and oriented. No focal neurological deficits. Psych: Judgement and insight appear normal. Mood & affect appropriate.   Labs: Basic Metabolic Panel: Recent Labs  Lab 09/10/17 0558 09/11/17 0705 09/12/17 0551 09/13/17 0649 09/14/17 0600   NA 139 139 141 140 140  K 5.0 5.0 4.9 4.9 5.2*  CL 111 112* 111 109 111  CO2 22 21* _0 GLUCOSE 140* 163* 115* 138* 150*  BUN 48* 44* 38* 42* 39*  CREATININE 2.77* 2.75* 2.66* 2.84* 2.83*  CALCIUM 8.3* 8.4* 8.5* 8.2* 8.6*   CBC: Recent Labs  Lab 09/10/17 0558 09/11/17 0705 09/12/17 0551 09/13/17 0649 09/14/17 0600  WBC 17.2* 15.4* 12.6* 12.0* 10.8*  HGB 8.2* 8.8* 8.7* 8.3* 7.9*  HCT 25.6* 27.7* 26.9* 25.9* 25.9*  MCV 91.1 91.4 91.2 91.5 91.8  PLT 808* 914* 928* 868* 984*   Cardiac Enzymes: Recent Labs  Lab 09/09/17 0606  CKTOTAL 34*   CBG: Recent Labs  Lab 09/13/17 1657 09/13/17 2141 09/14/17 0748 09/14/17 1137 09/14/17 1245  GLUCAP 103* 166* 130* 129* 127*   Urinalysis    Component Value Date/Time   COLORURINE YELLOW 08/31/2017 0818   APPEARANCEUR CLOUDY (A) 08/31/2017 0818   LABSPEC 1.015 08/31/2017 0818   PHURINE 5.0 08/31/2017 0818   GLUCOSEU 50 (A) 08/31/2017 0818   HGBUR SMALL (A) 08/31/2017 0818   BILIRUBINUR NEGATIVE 08/31/2017 0818   KETONESUR NEGATIVE 08/31/2017 0818   PROTEINUR 100 (A) 08/31/2017 0818   UROBILINOGEN 0.2 04/18/2015 1544   NITRITE NEGATIVE 08/31/2017 0818   LEUKOCYTESUR NEGATIVE 08/31/2017 0818    Time coordinating discharge: Approximately 40 minutes  Kayleen Memos, MD  [facility] 09/14/2017, 1:50 PM Pager [phone]

## 2017-09-16 DIAGNOSIS — E1151 Type 2 diabetes mellitus with diabetic peripheral angiopathy without gangrene: Secondary | ICD-10-CM | POA: Diagnosis not present

## 2017-09-16 DIAGNOSIS — M86271 Subacute osteomyelitis, right ankle and foot: Secondary | ICD-10-CM | POA: Diagnosis not present

## 2017-09-16 DIAGNOSIS — N183 Chronic kidney disease, stage 3 (moderate): Secondary | ICD-10-CM | POA: Diagnosis not present

## 2017-09-16 DIAGNOSIS — I10 Essential (primary) hypertension: Secondary | ICD-10-CM | POA: Diagnosis not present

## 2017-09-16 NOTE — Telephone Encounter (Signed)
Called and LVM to return call and schedule HFU appt. With PCP.

## 2017-09-18 DIAGNOSIS — A4902 Methicillin resistant Staphylococcus aureus infection, unspecified site: Secondary | ICD-10-CM

## 2017-09-18 DIAGNOSIS — M86179 Other acute osteomyelitis, unspecified ankle and foot: Secondary | ICD-10-CM

## 2017-09-27 ENCOUNTER — Ambulatory Visit (INDEPENDENT_AMBULATORY_CARE_PROVIDER_SITE_OTHER): Payer: Medicare Other | Admitting: Orthopaedic Surgery

## 2017-09-27 ENCOUNTER — Encounter (INDEPENDENT_AMBULATORY_CARE_PROVIDER_SITE_OTHER): Payer: Self-pay | Admitting: Orthopaedic Surgery

## 2017-09-27 VITALS — Ht 74.0 in | Wt 230.0 lb

## 2017-09-27 DIAGNOSIS — M86179 Other acute osteomyelitis, unspecified ankle and foot: Secondary | ICD-10-CM
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
## 2017-09-28 ENCOUNTER — Telehealth (INDEPENDENT_AMBULATORY_CARE_PROVIDER_SITE_OTHER): Payer: Self-pay | Admitting: Orthopaedic Surgery

## 2017-09-28 NOTE — Telephone Encounter (Signed)
Patient's wife [name]) called advised she has some questions concerning patient visit. The number to contact [name] is [phone]

## 2017-09-28 NOTE — Telephone Encounter (Signed)
I called discussed.  

## 2017-09-28 NOTE — Telephone Encounter (Signed)
Can you please call patient's wife?

## 2017-09-29 DIAGNOSIS — I5032 Chronic diastolic (congestive) heart failure: Secondary | ICD-10-CM | POA: Diagnosis not present

## 2017-09-29 DIAGNOSIS — L02611 Cutaneous abscess of right foot: Secondary | ICD-10-CM | POA: Diagnosis not present

## 2017-09-29 DIAGNOSIS — M6281 Muscle weakness (generalized): Secondary | ICD-10-CM | POA: Diagnosis not present

## 2017-09-29 DIAGNOSIS — D649 Anemia, unspecified: Secondary | ICD-10-CM | POA: Diagnosis not present

## 2017-09-29 DIAGNOSIS — E785 Hyperlipidemia, unspecified: Secondary | ICD-10-CM | POA: Diagnosis not present

## 2017-10-04 ENCOUNTER — Telehealth (INDEPENDENT_AMBULATORY_CARE_PROVIDER_SITE_OTHER): Payer: Self-pay | Admitting: Orthopaedic Surgery

## 2017-10-04 DIAGNOSIS — Z89512 Acquired absence of left leg below knee: Secondary | ICD-10-CM | POA: Diagnosis not present

## 2017-10-04 DIAGNOSIS — Z89421 Acquired absence of other right toe(s): Secondary | ICD-10-CM | POA: Diagnosis not present

## 2017-10-04 DIAGNOSIS — E1151 Type 2 diabetes mellitus with diabetic peripheral angiopathy without gangrene: Secondary | ICD-10-CM | POA: Diagnosis not present

## 2017-10-04 DIAGNOSIS — Z8631 Personal history of diabetic foot ulcer: Secondary | ICD-10-CM | POA: Diagnosis not present

## 2017-10-04 DIAGNOSIS — I130 Hypertensive heart and chronic kidney disease with heart failure and stage 1 through stage 4 chronic kidney disease, or unspecified chronic kidney disease: Secondary | ICD-10-CM | POA: Diagnosis not present

## 2017-10-04 DIAGNOSIS — N183 Chronic kidney disease, stage 3 (moderate): Secondary | ICD-10-CM | POA: Diagnosis not present

## 2017-10-04 DIAGNOSIS — Z4781 Encounter for orthopedic aftercare following surgical amputation: Secondary | ICD-10-CM | POA: Diagnosis not present

## 2017-10-04 DIAGNOSIS — E1122 Type 2 diabetes mellitus with diabetic chronic kidney disease: Secondary | ICD-10-CM | POA: Diagnosis not present

## 2017-10-04 DIAGNOSIS — I5032 Chronic diastolic (congestive) heart failure: Secondary | ICD-10-CM | POA: Diagnosis not present

## 2017-10-04 DIAGNOSIS — Z9181 History of falling: Secondary | ICD-10-CM | POA: Diagnosis not present

## 2017-10-04 DIAGNOSIS — Z794 Long term (current) use of insulin: Secondary | ICD-10-CM | POA: Diagnosis not present

## 2017-10-04 DIAGNOSIS — E785 Hyperlipidemia, unspecified: Secondary | ICD-10-CM | POA: Diagnosis not present

## 2017-10-04 MED ORDER — DOXYCYCLINE HYCLATE 100 MG PO CAPS: 100.0000 mg | ORAL_CAPSULE | Freq: Two times a day (BID) | ORAL | 0 refills | Status: DC

## 2017-10-04 NOTE — Telephone Encounter (Signed)
NONE NEEDED. THANKS

## 2017-10-04 NOTE — Telephone Encounter (Signed)
Patient called this morning stating that the antibiotic that Dr. Lorin Mercy prescribed for him costs $1300 which he can not afford.  He wants to know if Dr. Lorin Mercy wants him to take it because he does not know how he will get it.  CB#[phone].  Thank you.

## 2017-10-04 NOTE — Telephone Encounter (Signed)
Please advise 

## 2017-10-04 NOTE — Telephone Encounter (Signed)
Cecille Rubin from [facility] called asking if there needed to be lab work for his new antibiotic. CB # [phone]

## 2017-10-04 NOTE — Telephone Encounter (Signed)
I called discussed. Switch to [address]   Doxycycline 100mg  po bid  # 60.  ucall thanks. I was not the doc who put him on zyvox.

## 2017-10-04 NOTE — Telephone Encounter (Signed)
Sent to pharmacy 

## 2017-10-05 ENCOUNTER — Telehealth (INDEPENDENT_AMBULATORY_CARE_PROVIDER_SITE_OTHER): Payer: Self-pay | Admitting: Orthopaedic Surgery

## 2017-10-05 DIAGNOSIS — I5032 Chronic diastolic (congestive) heart failure: Secondary | ICD-10-CM | POA: Diagnosis not present

## 2017-10-05 DIAGNOSIS — Z4781 Encounter for orthopedic aftercare following surgical amputation: Secondary | ICD-10-CM | POA: Diagnosis not present

## 2017-10-05 DIAGNOSIS — E785 Hyperlipidemia, unspecified: Secondary | ICD-10-CM | POA: Diagnosis not present

## 2017-10-05 DIAGNOSIS — Z8631 Personal history of diabetic foot ulcer: Secondary | ICD-10-CM | POA: Diagnosis not present

## 2017-10-05 DIAGNOSIS — N183 Chronic kidney disease, stage 3 (moderate): Secondary | ICD-10-CM | POA: Diagnosis not present

## 2017-10-05 DIAGNOSIS — E1122 Type 2 diabetes mellitus with diabetic chronic kidney disease: Secondary | ICD-10-CM | POA: Diagnosis not present

## 2017-10-05 DIAGNOSIS — Z89512 Acquired absence of left leg below knee: Secondary | ICD-10-CM | POA: Diagnosis not present

## 2017-10-05 DIAGNOSIS — I130 Hypertensive heart and chronic kidney disease with heart failure and stage 1 through stage 4 chronic kidney disease, or unspecified chronic kidney disease: Secondary | ICD-10-CM | POA: Diagnosis not present

## 2017-10-05 DIAGNOSIS — Z89421 Acquired absence of other right toe(s): Secondary | ICD-10-CM | POA: Diagnosis not present

## 2017-10-05 DIAGNOSIS — E1151 Type 2 diabetes mellitus with diabetic peripheral angiopathy without gangrene: Secondary | ICD-10-CM | POA: Diagnosis not present

## 2017-10-05 DIAGNOSIS — Z794 Long term (current) use of insulin: Secondary | ICD-10-CM | POA: Diagnosis not present

## 2017-10-05 DIAGNOSIS — Z9181 History of falling: Secondary | ICD-10-CM | POA: Diagnosis not present

## 2017-10-05 NOTE — Telephone Encounter (Signed)
Pt refused physical therapy

## 2017-10-05 NOTE — Telephone Encounter (Signed)
I called Cecille Rubin and advised.

## 2017-10-05 NOTE — Telephone Encounter (Signed)
fyi

## 2017-10-05 NOTE — Telephone Encounter (Signed)
Dorena Dew, Nurse with [facility] called on behalf of patient letting us know he was discharged on 2/4 but was unable to get the antibiotic he was prescribed before getting discharged from facility due to the cost of medicine. She would like a call back at earliest convenience # [phone] ext [phone].

## 2017-10-06 DIAGNOSIS — M726 Necrotizing fasciitis: Secondary | ICD-10-CM | POA: Diagnosis not present

## 2017-10-06 DIAGNOSIS — A4189 Other specified sepsis: Secondary | ICD-10-CM | POA: Diagnosis not present

## 2017-10-06 DIAGNOSIS — R2689 Other abnormalities of gait and mobility: Secondary | ICD-10-CM | POA: Diagnosis not present

## 2017-10-06 DIAGNOSIS — L0390 Cellulitis, unspecified: Secondary | ICD-10-CM | POA: Diagnosis not present

## 2017-10-06 DIAGNOSIS — A419 Sepsis, unspecified organism: Secondary | ICD-10-CM | POA: Diagnosis not present

## 2017-10-06 NOTE — Telephone Encounter (Signed)
I was unable to reach Diane this evening. Could you please try tomorrow? Thanks.

## 2017-10-07 DIAGNOSIS — Z4781 Encounter for orthopedic aftercare following surgical amputation: Secondary | ICD-10-CM | POA: Diagnosis not present

## 2017-10-07 DIAGNOSIS — Z8631 Personal history of diabetic foot ulcer: Secondary | ICD-10-CM | POA: Diagnosis not present

## 2017-10-07 DIAGNOSIS — A4189 Other specified sepsis: Secondary | ICD-10-CM | POA: Diagnosis not present

## 2017-10-07 DIAGNOSIS — Z89421 Acquired absence of other right toe(s): Secondary | ICD-10-CM | POA: Diagnosis not present

## 2017-10-07 DIAGNOSIS — I130 Hypertensive heart and chronic kidney disease with heart failure and stage 1 through stage 4 chronic kidney disease, or unspecified chronic kidney disease: Secondary | ICD-10-CM | POA: Diagnosis not present

## 2017-10-07 DIAGNOSIS — E1151 Type 2 diabetes mellitus with diabetic peripheral angiopathy without gangrene: Secondary | ICD-10-CM | POA: Diagnosis not present

## 2017-10-07 DIAGNOSIS — Z794 Long term (current) use of insulin: Secondary | ICD-10-CM | POA: Diagnosis not present

## 2017-10-07 DIAGNOSIS — E1122 Type 2 diabetes mellitus with diabetic chronic kidney disease: Secondary | ICD-10-CM | POA: Diagnosis not present

## 2017-10-07 DIAGNOSIS — M726 Necrotizing fasciitis: Secondary | ICD-10-CM | POA: Diagnosis not present

## 2017-10-07 DIAGNOSIS — E785 Hyperlipidemia, unspecified: Secondary | ICD-10-CM | POA: Diagnosis not present

## 2017-10-07 DIAGNOSIS — T8189XA Other complications of procedures, not elsewhere classified, initial encounter: Secondary | ICD-10-CM | POA: Diagnosis not present

## 2017-10-07 DIAGNOSIS — L0390 Cellulitis, unspecified: Secondary | ICD-10-CM | POA: Diagnosis not present

## 2017-10-07 DIAGNOSIS — Z9181 History of falling: Secondary | ICD-10-CM | POA: Diagnosis not present

## 2017-10-07 DIAGNOSIS — N183 Chronic kidney disease, stage 3 (moderate): Secondary | ICD-10-CM | POA: Diagnosis not present

## 2017-10-07 DIAGNOSIS — I5032 Chronic diastolic (congestive) heart failure: Secondary | ICD-10-CM | POA: Diagnosis not present

## 2017-10-07 DIAGNOSIS — Z89512 Acquired absence of left leg below knee: Secondary | ICD-10-CM | POA: Diagnosis not present

## 2017-10-07 DIAGNOSIS — A419 Sepsis, unspecified organism: Secondary | ICD-10-CM | POA: Diagnosis not present

## 2017-10-07 MED ORDER — CLINDAMYCIN HCL 300 MG PO CAPS: 300.0000 mg | ORAL_CAPSULE | Freq: Three times a day (TID) | ORAL | 0 refills | Status: DC

## 2017-10-07 MED FILL — CLINDAMYCIN HCL 300 MG CAP: 300 | 14 days supply | Qty: 42 | Fill #0

## 2017-10-07 NOTE — Telephone Encounter (Signed)
IC this [employer] nurse and LM to call me back.  See below.   It appears patient did not get doxycycline that was prescribed.  Is there another antibiotic you would recommend?

## 2017-10-07 NOTE — Telephone Encounter (Signed)
Try sending in Clindamycin 300 mg 3 times daily for 2 weeks.

## 2017-10-07 NOTE — Telephone Encounter (Signed)
I am sending this to you since you are on practice call and I have gotten no response from Jeneen Rinks, and Dr Lorin Mercy is off today. See note below.  Can you review chart and advise on another antibiotic for this patient?  Given the weekend is upcoming I did not want this to go unanswered until next week.

## 2017-10-07 NOTE — Telephone Encounter (Signed)
IC patient and LMVM advising that Rx sent to pharm for him, hopefully this will be more affordable for him.

## 2017-10-08 DIAGNOSIS — R600 Localized edema: Secondary | ICD-10-CM | POA: Diagnosis not present

## 2017-10-08 DIAGNOSIS — M7541 Impingement syndrome of right shoulder: Secondary | ICD-10-CM | POA: Diagnosis not present

## 2017-10-08 DIAGNOSIS — N184 Chronic kidney disease, stage 4 (severe): Secondary | ICD-10-CM | POA: Diagnosis not present

## 2017-10-08 DIAGNOSIS — M7542 Impingement syndrome of left shoulder: Secondary | ICD-10-CM | POA: Diagnosis not present

## 2017-10-08 DIAGNOSIS — S88119A Complete traumatic amputation at level between knee and ankle, unspecified lower leg, initial encounter: Secondary | ICD-10-CM | POA: Diagnosis not present

## 2017-10-08 DIAGNOSIS — I5032 Chronic diastolic (congestive) heart failure: Secondary | ICD-10-CM | POA: Diagnosis not present

## 2017-10-10 DIAGNOSIS — Z794 Long term (current) use of insulin: Secondary | ICD-10-CM | POA: Diagnosis not present

## 2017-10-10 DIAGNOSIS — Z89512 Acquired absence of left leg below knee: Secondary | ICD-10-CM | POA: Diagnosis not present

## 2017-10-10 DIAGNOSIS — E785 Hyperlipidemia, unspecified: Secondary | ICD-10-CM | POA: Diagnosis not present

## 2017-10-10 DIAGNOSIS — I5032 Chronic diastolic (congestive) heart failure: Secondary | ICD-10-CM | POA: Diagnosis not present

## 2017-10-10 DIAGNOSIS — Z4781 Encounter for orthopedic aftercare following surgical amputation: Secondary | ICD-10-CM | POA: Diagnosis not present

## 2017-10-10 DIAGNOSIS — Z8631 Personal history of diabetic foot ulcer: Secondary | ICD-10-CM | POA: Diagnosis not present

## 2017-10-10 DIAGNOSIS — I130 Hypertensive heart and chronic kidney disease with heart failure and stage 1 through stage 4 chronic kidney disease, or unspecified chronic kidney disease: Secondary | ICD-10-CM | POA: Diagnosis not present

## 2017-10-10 DIAGNOSIS — E1122 Type 2 diabetes mellitus with diabetic chronic kidney disease: Secondary | ICD-10-CM | POA: Diagnosis not present

## 2017-10-10 DIAGNOSIS — N183 Chronic kidney disease, stage 3 (moderate): Secondary | ICD-10-CM | POA: Diagnosis not present

## 2017-10-10 DIAGNOSIS — E1151 Type 2 diabetes mellitus with diabetic peripheral angiopathy without gangrene: Secondary | ICD-10-CM | POA: Diagnosis not present

## 2017-10-10 DIAGNOSIS — Z9181 History of falling: Secondary | ICD-10-CM | POA: Diagnosis not present

## 2017-10-10 DIAGNOSIS — Z89421 Acquired absence of other right toe(s): Secondary | ICD-10-CM | POA: Diagnosis not present

## 2017-10-12 ENCOUNTER — Ambulatory Visit: Payer: Medicare Other | Admitting: Neurology

## 2017-10-12 DIAGNOSIS — Z4781 Encounter for orthopedic aftercare following surgical amputation: Secondary | ICD-10-CM | POA: Diagnosis not present

## 2017-10-12 DIAGNOSIS — Z794 Long term (current) use of insulin: Secondary | ICD-10-CM | POA: Diagnosis not present

## 2017-10-12 DIAGNOSIS — Z89512 Acquired absence of left leg below knee: Secondary | ICD-10-CM | POA: Diagnosis not present

## 2017-10-12 DIAGNOSIS — Z9181 History of falling: Secondary | ICD-10-CM | POA: Diagnosis not present

## 2017-10-12 DIAGNOSIS — I5032 Chronic diastolic (congestive) heart failure: Secondary | ICD-10-CM | POA: Diagnosis not present

## 2017-10-12 DIAGNOSIS — E1151 Type 2 diabetes mellitus with diabetic peripheral angiopathy without gangrene: Secondary | ICD-10-CM | POA: Diagnosis not present

## 2017-10-12 DIAGNOSIS — Z8631 Personal history of diabetic foot ulcer: Secondary | ICD-10-CM | POA: Diagnosis not present

## 2017-10-12 DIAGNOSIS — E785 Hyperlipidemia, unspecified: Secondary | ICD-10-CM | POA: Diagnosis not present

## 2017-10-12 DIAGNOSIS — I130 Hypertensive heart and chronic kidney disease with heart failure and stage 1 through stage 4 chronic kidney disease, or unspecified chronic kidney disease: Secondary | ICD-10-CM | POA: Diagnosis not present

## 2017-10-12 DIAGNOSIS — E1122 Type 2 diabetes mellitus with diabetic chronic kidney disease: Secondary | ICD-10-CM | POA: Diagnosis not present

## 2017-10-12 DIAGNOSIS — N183 Chronic kidney disease, stage 3 (moderate): Secondary | ICD-10-CM | POA: Diagnosis not present

## 2017-10-12 DIAGNOSIS — Z89421 Acquired absence of other right toe(s): Secondary | ICD-10-CM | POA: Diagnosis not present

## 2017-10-14 DIAGNOSIS — Z4781 Encounter for orthopedic aftercare following surgical amputation: Secondary | ICD-10-CM | POA: Diagnosis not present

## 2017-10-14 DIAGNOSIS — Z89512 Acquired absence of left leg below knee: Secondary | ICD-10-CM | POA: Diagnosis not present

## 2017-10-14 DIAGNOSIS — Z8631 Personal history of diabetic foot ulcer: Secondary | ICD-10-CM | POA: Diagnosis not present

## 2017-10-14 DIAGNOSIS — Z89421 Acquired absence of other right toe(s): Secondary | ICD-10-CM | POA: Diagnosis not present

## 2017-10-14 DIAGNOSIS — I130 Hypertensive heart and chronic kidney disease with heart failure and stage 1 through stage 4 chronic kidney disease, or unspecified chronic kidney disease: Secondary | ICD-10-CM | POA: Diagnosis not present

## 2017-10-14 DIAGNOSIS — N183 Chronic kidney disease, stage 3 (moderate): Secondary | ICD-10-CM | POA: Diagnosis not present

## 2017-10-14 DIAGNOSIS — E1122 Type 2 diabetes mellitus with diabetic chronic kidney disease: Secondary | ICD-10-CM | POA: Diagnosis not present

## 2017-10-14 DIAGNOSIS — Z9181 History of falling: Secondary | ICD-10-CM | POA: Diagnosis not present

## 2017-10-14 DIAGNOSIS — E785 Hyperlipidemia, unspecified: Secondary | ICD-10-CM | POA: Diagnosis not present

## 2017-10-14 DIAGNOSIS — Z794 Long term (current) use of insulin: Secondary | ICD-10-CM | POA: Diagnosis not present

## 2017-10-14 DIAGNOSIS — I5032 Chronic diastolic (congestive) heart failure: Secondary | ICD-10-CM | POA: Diagnosis not present

## 2017-10-14 DIAGNOSIS — E1151 Type 2 diabetes mellitus with diabetic peripheral angiopathy without gangrene: Secondary | ICD-10-CM | POA: Diagnosis not present

## 2017-10-17 ENCOUNTER — Ambulatory Visit: Payer: Medicare Other | Admitting: Internal Medicine

## 2017-10-17 DIAGNOSIS — N183 Chronic kidney disease, stage 3 (moderate): Secondary | ICD-10-CM | POA: Diagnosis not present

## 2017-10-17 DIAGNOSIS — Z4781 Encounter for orthopedic aftercare following surgical amputation: Secondary | ICD-10-CM | POA: Diagnosis not present

## 2017-10-17 DIAGNOSIS — E1151 Type 2 diabetes mellitus with diabetic peripheral angiopathy without gangrene: Secondary | ICD-10-CM | POA: Diagnosis not present

## 2017-10-17 DIAGNOSIS — Z89421 Acquired absence of other right toe(s): Secondary | ICD-10-CM | POA: Diagnosis not present

## 2017-10-17 DIAGNOSIS — I5032 Chronic diastolic (congestive) heart failure: Secondary | ICD-10-CM | POA: Diagnosis not present

## 2017-10-17 DIAGNOSIS — Z89512 Acquired absence of left leg below knee: Secondary | ICD-10-CM | POA: Diagnosis not present

## 2017-10-17 DIAGNOSIS — Z9181 History of falling: Secondary | ICD-10-CM | POA: Diagnosis not present

## 2017-10-17 DIAGNOSIS — E785 Hyperlipidemia, unspecified: Secondary | ICD-10-CM | POA: Diagnosis not present

## 2017-10-17 DIAGNOSIS — Z794 Long term (current) use of insulin: Secondary | ICD-10-CM | POA: Diagnosis not present

## 2017-10-17 DIAGNOSIS — Z8631 Personal history of diabetic foot ulcer: Secondary | ICD-10-CM | POA: Diagnosis not present

## 2017-10-17 DIAGNOSIS — I130 Hypertensive heart and chronic kidney disease with heart failure and stage 1 through stage 4 chronic kidney disease, or unspecified chronic kidney disease: Secondary | ICD-10-CM | POA: Diagnosis not present

## 2017-10-17 DIAGNOSIS — E1122 Type 2 diabetes mellitus with diabetic chronic kidney disease: Secondary | ICD-10-CM | POA: Diagnosis not present

## 2017-10-18 ENCOUNTER — Ambulatory Visit: Payer: Medicare Other | Attending: Internal Medicine | Admitting: Internal Medicine

## 2017-10-18 ENCOUNTER — Encounter: Payer: Self-pay | Admitting: Internal Medicine

## 2017-10-18 VITALS — BP 138/58 | HR 72 | Temp 98.1°F | Resp 16 | Wt 163.8 lb

## 2017-10-18 DIAGNOSIS — Z794 Long term (current) use of insulin: Secondary | ICD-10-CM | POA: Diagnosis not present

## 2017-10-18 DIAGNOSIS — Z79899 Other long term (current) drug therapy: Secondary | ICD-10-CM | POA: Insufficient documentation

## 2017-10-18 DIAGNOSIS — G40909 Epilepsy, unspecified, not intractable, without status epilepticus: Secondary | ICD-10-CM | POA: Diagnosis not present

## 2017-10-18 DIAGNOSIS — Z7982 Long term (current) use of aspirin: Secondary | ICD-10-CM | POA: Insufficient documentation

## 2017-10-18 DIAGNOSIS — E118 Type 2 diabetes mellitus with unspecified complications: Secondary | ICD-10-CM | POA: Diagnosis not present

## 2017-10-18 DIAGNOSIS — I129 Hypertensive chronic kidney disease with stage 1 through stage 4 chronic kidney disease, or unspecified chronic kidney disease: Secondary | ICD-10-CM | POA: Diagnosis not present

## 2017-10-18 DIAGNOSIS — I5032 Chronic diastolic (congestive) heart failure: Secondary | ICD-10-CM | POA: Insufficient documentation

## 2017-10-18 DIAGNOSIS — E1122 Type 2 diabetes mellitus with diabetic chronic kidney disease: Secondary | ICD-10-CM | POA: Insufficient documentation

## 2017-10-18 DIAGNOSIS — I10 Essential (primary) hypertension: Secondary | ICD-10-CM

## 2017-10-18 DIAGNOSIS — M869 Osteomyelitis, unspecified: Secondary | ICD-10-CM | POA: Diagnosis not present

## 2017-10-18 DIAGNOSIS — Z89512 Acquired absence of left leg below knee: Secondary | ICD-10-CM | POA: Diagnosis not present

## 2017-10-18 DIAGNOSIS — M868X7 Other osteomyelitis, ankle and foot: Secondary | ICD-10-CM | POA: Insufficient documentation

## 2017-10-18 DIAGNOSIS — D509 Iron deficiency anemia, unspecified: Secondary | ICD-10-CM | POA: Diagnosis not present

## 2017-10-18 DIAGNOSIS — N184 Chronic kidney disease, stage 4 (severe): Secondary | ICD-10-CM

## 2017-10-18 DIAGNOSIS — K219 Gastro-esophageal reflux disease without esophagitis: Secondary | ICD-10-CM | POA: Diagnosis not present

## 2017-10-18 DIAGNOSIS — Z87891 Personal history of nicotine dependence: Secondary | ICD-10-CM | POA: Insufficient documentation

## 2017-10-18 LAB — GLUCOSE, POCT (MANUAL RESULT ENTRY): POC Glucose: 154 mg/dl — AB (ref 70–99)
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
## 2017-10-18 NOTE — Patient Instructions (Signed)
Keep follow up with orthopedic and Dr. Johnnye Sima tomorrow.   Ask your insurance about paying for a ramp for you.

## 2017-10-19 ENCOUNTER — Inpatient Hospital Stay: Payer: Medicare Other | Admitting: Infectious Diseases

## 2017-10-19 ENCOUNTER — Telehealth: Payer: Self-pay | Admitting: Internal Medicine

## 2017-10-19 ENCOUNTER — Encounter (INDEPENDENT_AMBULATORY_CARE_PROVIDER_SITE_OTHER): Payer: Self-pay | Admitting: Orthopaedic Surgery

## 2017-10-19 ENCOUNTER — Ambulatory Visit (INDEPENDENT_AMBULATORY_CARE_PROVIDER_SITE_OTHER): Payer: Medicare Other | Admitting: Orthopaedic Surgery

## 2017-10-19 VITALS — BP 148/74 | HR 74

## 2017-10-19 DIAGNOSIS — N183 Chronic kidney disease, stage 3 (moderate): Secondary | ICD-10-CM | POA: Diagnosis not present

## 2017-10-19 DIAGNOSIS — Z794 Long term (current) use of insulin: Secondary | ICD-10-CM | POA: Diagnosis not present

## 2017-10-19 DIAGNOSIS — E1151 Type 2 diabetes mellitus with diabetic peripheral angiopathy without gangrene: Secondary | ICD-10-CM | POA: Diagnosis not present

## 2017-10-19 DIAGNOSIS — E1122 Type 2 diabetes mellitus with diabetic chronic kidney disease: Secondary | ICD-10-CM | POA: Diagnosis not present

## 2017-10-19 DIAGNOSIS — Z89512 Acquired absence of left leg below knee: Secondary | ICD-10-CM | POA: Diagnosis not present

## 2017-10-19 DIAGNOSIS — M86179 Other acute osteomyelitis, unspecified ankle and foot: Secondary | ICD-10-CM

## 2017-10-19 DIAGNOSIS — I130 Hypertensive heart and chronic kidney disease with heart failure and stage 1 through stage 4 chronic kidney disease, or unspecified chronic kidney disease: Secondary | ICD-10-CM | POA: Diagnosis not present

## 2017-10-19 DIAGNOSIS — Z89421 Acquired absence of other right toe(s): Secondary | ICD-10-CM | POA: Diagnosis not present

## 2017-10-19 DIAGNOSIS — Z9181 History of falling: Secondary | ICD-10-CM | POA: Diagnosis not present

## 2017-10-19 DIAGNOSIS — Z4781 Encounter for orthopedic aftercare following surgical amputation: Secondary | ICD-10-CM | POA: Diagnosis not present

## 2017-10-19 DIAGNOSIS — Z8631 Personal history of diabetic foot ulcer: Secondary | ICD-10-CM | POA: Diagnosis not present

## 2017-10-19 DIAGNOSIS — I5032 Chronic diastolic (congestive) heart failure: Secondary | ICD-10-CM | POA: Diagnosis not present

## 2017-10-19 DIAGNOSIS — E785 Hyperlipidemia, unspecified: Secondary | ICD-10-CM | POA: Diagnosis not present

## 2017-10-19 LAB — COMPREHENSIVE METABOLIC PANEL
ALT: 13 IU/L (ref 0–44)
AST: 16 IU/L (ref 0–40)
Albumin/Globulin Ratio: 0.9 — ABNORMAL LOW (ref 1.2–2.2)
Albumin: 3.4 g/dL — ABNORMAL LOW (ref 3.6–4.8)
Alkaline Phosphatase: 107 IU/L (ref 39–117)
BUN/Creatinine Ratio: 20 (ref 10–24)
BUN: 54 mg/dL — ABNORMAL HIGH (ref 8–27)
Bilirubin Total: <0.2 mg/dL (ref 0.0–1.2)
CO2: 16 mmol/L — ABNORMAL LOW (ref 20–29)
Calcium: 8.6 mg/dL (ref 8.6–10.2)
Chloride: 117 mmol/L (ref 96–106)
Creatinine, Ser: 2.75 mg/dL — ABNORMAL HIGH (ref 0.76–1.27)
GFR calc Af Amer: 27 mL/min/{1.73_m2} — ABNORMAL LOW (ref >59)
GFR calc non Af Amer: 23 mL/min/{1.73_m2} — ABNORMAL LOW (ref >59)
Globulin, Total: 3.6 g/dL (ref 1.5–4.5)
Glucose: 162 mg/dL — ABNORMAL HIGH (ref 65–99)
Potassium: 4.9 mmol/L (ref 3.5–5.2)
Sodium: 145 mmol/L — ABNORMAL HIGH (ref 134–144)
Total Protein: 7 g/dL (ref 6.0–8.5)

## 2017-10-19 LAB — CBC WITH DIFFERENTIAL
Basophils Absolute: 0 10*3/uL (ref 0.0–0.2)
Basos: 0 %
EOS (ABSOLUTE): 0.5 10*3/uL — ABNORMAL HIGH (ref 0.0–0.4)
Eos: 6 %
Hematocrit: 25.4 % — ABNORMAL LOW (ref 37.5–51.0)
Hemoglobin: 7.9 g/dL — ABNORMAL LOW (ref 13.0–17.7)
Immature Grans (Abs): 0 10*3/uL (ref 0.0–0.1)
Immature Granulocytes: 0 %
Lymphocytes Absolute: 3.9 10*3/uL — ABNORMAL HIGH (ref 0.7–3.1)
Lymphs: 42 %
MCH: 28.5 pg (ref 26.6–33.0)
MCHC: 31.1 g/dL — ABNORMAL LOW (ref 31.5–35.7)
MCV: 92 fL (ref 79–97)
Monocytes Absolute: 0.8 10*3/uL (ref 0.1–0.9)
Monocytes: 9 %
Neutrophils Absolute: 3.9 10*3/uL (ref 1.4–7.0)
Neutrophils: 43 %
RBC: 2.77 x10E6/uL — ABNORMAL LOW (ref 4.14–5.80)
RDW: 16.4 % — ABNORMAL HIGH (ref 12.3–15.4)
WBC: 9.1 10*3/uL (ref 3.4–10.8)

## 2017-10-19 LAB — MICROALBUMIN / CREATININE URINE RATIO
Creatinine, Urine: 71.9 mg/dL
Microalb/Creat Ratio: 2130 mg/g creat — ABNORMAL HIGH (ref 0.0–30.0)
Microalbumin, Urine: 1531.5 ug/mL

## 2017-10-19 NOTE — Telephone Encounter (Signed)
Attempt to reach pt this a.m with lab results. VM box not set up yet on cell.  Did not Leave message on home phone.  I will have my CMA attempt to reach him later. CMA: please let CMA know that labs suggest that he is dehydrated.  Hold the fluid pill Furosemide for two days.  Drink several glasses of water daily for the next 3-4 days.  Return to lab in 1 wk for recheck.  Still very anemic but stable from 1 mth ago. Make sure he is taking iron supplement twice a day.  I am trying to get him to the kidney specialist who may prescribe shots to help bring his blood count up.

## 2017-10-20 MED FILL — ACCU-CHEK AVIVA PLUS TEST S: 30 days supply | Qty: 100 | Fill #3

## 2017-10-20 NOTE — Telephone Encounter (Signed)
Tried reaching pt for provider and was unable to lvm

## 2017-10-21 ENCOUNTER — Other Ambulatory Visit: Payer: Self-pay

## 2017-10-21 ENCOUNTER — Ambulatory Visit: Payer: Medicare Other | Admitting: Podiatry

## 2017-10-21 ENCOUNTER — Emergency Department (HOSPITAL_COMMUNITY)
Admission: EM | Admit: 2017-10-21 | Discharge: 2017-10-21 | Disposition: A | Discharge status: Home / Self Care | Payer: Medicare Other | Attending: Emergency Medicine | Admitting: Emergency Medicine

## 2017-10-21 ENCOUNTER — Emergency Department (HOSPITAL_COMMUNITY): Payer: Medicare Other

## 2017-10-21 ENCOUNTER — Encounter (HOSPITAL_COMMUNITY): Payer: Self-pay | Admitting: Emergency Medicine

## 2017-10-21 ENCOUNTER — Encounter (INDEPENDENT_AMBULATORY_CARE_PROVIDER_SITE_OTHER): Payer: Self-pay | Admitting: Orthopaedic Surgery

## 2017-10-21 DIAGNOSIS — Z7982 Long term (current) use of aspirin: Secondary | ICD-10-CM | POA: Insufficient documentation

## 2017-10-21 DIAGNOSIS — R1110 Vomiting, unspecified: Secondary | ICD-10-CM | POA: Insufficient documentation

## 2017-10-21 DIAGNOSIS — Z794 Long term (current) use of insulin: Secondary | ICD-10-CM | POA: Diagnosis not present

## 2017-10-21 DIAGNOSIS — Z87891 Personal history of nicotine dependence: Secondary | ICD-10-CM | POA: Insufficient documentation

## 2017-10-21 DIAGNOSIS — I509 Heart failure, unspecified: Secondary | ICD-10-CM | POA: Insufficient documentation

## 2017-10-21 DIAGNOSIS — I130 Hypertensive heart and chronic kidney disease with heart failure and stage 1 through stage 4 chronic kidney disease, or unspecified chronic kidney disease: Secondary | ICD-10-CM | POA: Diagnosis not present

## 2017-10-21 DIAGNOSIS — R109 Unspecified abdominal pain: Secondary | ICD-10-CM | POA: Diagnosis not present

## 2017-10-21 DIAGNOSIS — N201 Calculus of ureter: Secondary | ICD-10-CM | POA: Diagnosis not present

## 2017-10-21 DIAGNOSIS — N200 Calculus of kidney: Secondary | ICD-10-CM | POA: Insufficient documentation

## 2017-10-21 DIAGNOSIS — Z79899 Other long term (current) drug therapy: Secondary | ICD-10-CM | POA: Insufficient documentation

## 2017-10-21 DIAGNOSIS — E1122 Type 2 diabetes mellitus with diabetic chronic kidney disease: Secondary | ICD-10-CM | POA: Insufficient documentation

## 2017-10-21 DIAGNOSIS — Z8614 Personal history of Methicillin resistant Staphylococcus aureus infection: Secondary | ICD-10-CM | POA: Insufficient documentation

## 2017-10-21 DIAGNOSIS — N183 Chronic kidney disease, stage 3 (moderate): Secondary | ICD-10-CM | POA: Insufficient documentation

## 2017-10-21 DIAGNOSIS — Z89512 Acquired absence of left leg below knee: Secondary | ICD-10-CM | POA: Diagnosis not present

## 2017-10-21 DIAGNOSIS — R1032 Left lower quadrant pain: Secondary | ICD-10-CM | POA: Diagnosis present

## 2017-10-21 LAB — URINALYSIS, ROUTINE W REFLEX MICROSCOPIC
Bilirubin Urine: NEGATIVE
Glucose, UA: 50 mg/dL — AB
Ketones, ur: NEGATIVE mg/dL
Leukocytes, UA: NEGATIVE
Nitrite: NEGATIVE
Protein, ur: 100 mg/dL — AB
Specific Gravity, Urine: 1.01 (ref 1.005–1.030)
Squamous Epithelial / LPF: NONE SEEN
pH: 5 (ref 5.0–8.0)

## 2017-10-21 LAB — BASIC METABOLIC PANEL
Anion gap: 10 (ref 5–15)
BUN: 62 mg/dL — ABNORMAL HIGH (ref 6–20)
CO2: 15 mmol/L — ABNORMAL LOW (ref 22–32)
Calcium: 8.8 mg/dL — ABNORMAL LOW (ref 8.9–10.3)
Chloride: 116 mmol/L — ABNORMAL HIGH (ref 101–111)
Creatinine, Ser: 3.01 mg/dL — ABNORMAL HIGH (ref 0.61–1.24)
GFR calc Af Amer: 23 mL/min — ABNORMAL LOW (ref >60)
GFR calc non Af Amer: 20 mL/min — ABNORMAL LOW (ref >60)
Glucose, Bld: 141 mg/dL — ABNORMAL HIGH (ref 65–99)
Potassium: 4 mmol/L (ref 3.5–5.1)
Sodium: 141 mmol/L (ref 135–145)

## 2017-10-21 LAB — CBC
HCT: 26.2 % — ABNORMAL LOW (ref 39.0–52.0)
Hemoglobin: 8.4 g/dL — ABNORMAL LOW (ref 13.0–17.0)
MCH: 29.6 pg (ref 26.0–34.0)
MCHC: 32.1 g/dL (ref 30.0–36.0)
MCV: 92.3 fL (ref 78.0–100.0)
Platelets: 391 10*3/uL (ref 150–400)
RBC: 2.84 MIL/uL — ABNORMAL LOW (ref 4.22–5.81)
RDW: 16.5 % — ABNORMAL HIGH (ref 11.5–15.5)
WBC: 9.1 10*3/uL (ref 4.0–10.5)

## 2017-10-21 MED ORDER — HYDROCODONE-ACETAMINOPHEN 5-325 MG PO TABS: 2.0000 | ORAL_TABLET | ORAL | 0 refills | Status: DC | PRN

## 2017-10-21 MED ORDER — SODIUM CHLORIDE 0.9 % IV BOLUS (SEPSIS)
1000.0000 mL | Freq: Once | INTRAVENOUS | Status: AC
  Administered 2017-10-21: 1000 mL via INTRAVENOUS

## 2017-10-21 MED ORDER — HYDROCODONE-ACETAMINOPHEN 5-325 MG PO TABS
1.0000 | ORAL_TABLET | Freq: Once | ORAL | Status: AC
  Administered 2017-10-21: 1 via ORAL
  Filled 2017-10-21: qty 1

## 2017-10-21 NOTE — ED Triage Notes (Signed)
Pt complaint of left flank pain noted after using restroom this morning; hx of kidney stones.

## 2017-10-21 NOTE — Telephone Encounter (Signed)
Tried contacting pt for a second time and was unable to reach pt
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
## 2017-10-21 NOTE — Care Management Note (Signed)
Case Management Note  Patient Details  Name: Mark [name] MRN: [id number] Date of Birth: 26-Jun-1951  CM noted pt was active with [facility] from [facility].  Contacted Santiago Glad who confirmed [facility].  Expected Discharge Date:    Unknown              Expected Discharge Plan:  [place] Choice:  Home Health Choice offered to:  Patient  HH Arranged:  RN [facility] Agency:  [place]  Status of Service:  In process, will continue to follow  Rae Mar, RN 10/21/2017, 12:46 PM
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
## 2017-10-21 NOTE — ED Notes (Signed)
ED Provider at bedside. 

## 2017-10-22 DIAGNOSIS — N183 Chronic kidney disease, stage 3 (moderate): Secondary | ICD-10-CM | POA: Diagnosis not present

## 2017-10-22 DIAGNOSIS — E1151 Type 2 diabetes mellitus with diabetic peripheral angiopathy without gangrene: Secondary | ICD-10-CM | POA: Diagnosis not present

## 2017-10-22 DIAGNOSIS — Z4781 Encounter for orthopedic aftercare following surgical amputation: Secondary | ICD-10-CM | POA: Diagnosis not present

## 2017-10-22 DIAGNOSIS — E785 Hyperlipidemia, unspecified: Secondary | ICD-10-CM | POA: Diagnosis not present

## 2017-10-22 DIAGNOSIS — I130 Hypertensive heart and chronic kidney disease with heart failure and stage 1 through stage 4 chronic kidney disease, or unspecified chronic kidney disease: Secondary | ICD-10-CM | POA: Diagnosis not present

## 2017-10-22 DIAGNOSIS — Z8631 Personal history of diabetic foot ulcer: Secondary | ICD-10-CM | POA: Diagnosis not present

## 2017-10-22 DIAGNOSIS — Z9181 History of falling: Secondary | ICD-10-CM | POA: Diagnosis not present

## 2017-10-22 DIAGNOSIS — Z89421 Acquired absence of other right toe(s): Secondary | ICD-10-CM | POA: Diagnosis not present

## 2017-10-22 DIAGNOSIS — Z89512 Acquired absence of left leg below knee: Secondary | ICD-10-CM | POA: Diagnosis not present

## 2017-10-22 DIAGNOSIS — E1122 Type 2 diabetes mellitus with diabetic chronic kidney disease: Secondary | ICD-10-CM | POA: Diagnosis not present

## 2017-10-22 DIAGNOSIS — Z794 Long term (current) use of insulin: Secondary | ICD-10-CM | POA: Diagnosis not present

## 2017-10-22 DIAGNOSIS — I5032 Chronic diastolic (congestive) heart failure: Secondary | ICD-10-CM | POA: Diagnosis not present

## 2017-10-24 DIAGNOSIS — N183 Chronic kidney disease, stage 3 (moderate): Secondary | ICD-10-CM | POA: Diagnosis not present

## 2017-10-24 DIAGNOSIS — E1151 Type 2 diabetes mellitus with diabetic peripheral angiopathy without gangrene: Secondary | ICD-10-CM | POA: Diagnosis not present

## 2017-10-24 DIAGNOSIS — E785 Hyperlipidemia, unspecified: Secondary | ICD-10-CM | POA: Diagnosis not present

## 2017-10-24 DIAGNOSIS — I130 Hypertensive heart and chronic kidney disease with heart failure and stage 1 through stage 4 chronic kidney disease, or unspecified chronic kidney disease: Secondary | ICD-10-CM | POA: Diagnosis not present

## 2017-10-24 DIAGNOSIS — Z9181 History of falling: Secondary | ICD-10-CM | POA: Diagnosis not present

## 2017-10-24 DIAGNOSIS — Z8631 Personal history of diabetic foot ulcer: Secondary | ICD-10-CM | POA: Diagnosis not present

## 2017-10-24 DIAGNOSIS — E1122 Type 2 diabetes mellitus with diabetic chronic kidney disease: Secondary | ICD-10-CM | POA: Diagnosis not present

## 2017-10-24 DIAGNOSIS — Z89512 Acquired absence of left leg below knee: Secondary | ICD-10-CM | POA: Diagnosis not present

## 2017-10-24 DIAGNOSIS — Z89421 Acquired absence of other right toe(s): Secondary | ICD-10-CM | POA: Diagnosis not present

## 2017-10-24 DIAGNOSIS — Z794 Long term (current) use of insulin: Secondary | ICD-10-CM | POA: Diagnosis not present

## 2017-10-24 DIAGNOSIS — Z4781 Encounter for orthopedic aftercare following surgical amputation: Secondary | ICD-10-CM | POA: Diagnosis not present

## 2017-10-24 DIAGNOSIS — I5032 Chronic diastolic (congestive) heart failure: Secondary | ICD-10-CM | POA: Diagnosis not present

## 2017-10-24 MED FILL — BD PEN NDL MINI 31GX5MM: 31G X 5 MM | 25 days supply | Qty: 100 | Fill #1

## 2017-10-24 NOTE — Telephone Encounter (Signed)
Contacted pt and spoke with him and informed him of his lab results and what Dr. Wynetta Emery would like for him to do is by holding his fluid pill for 2 days and drinking several glasses of water for the next 3-4 days pt states he will come I next week for his repeat lab. Pt states he understands and doesn't have any questions or concerns

## 2017-10-25 ENCOUNTER — Telehealth: Payer: Self-pay | Admitting: Internal Medicine

## 2017-10-25 NOTE — Telephone Encounter (Signed)
5 page, paperwork received through fax 10-25-17.

## 2017-10-26 ENCOUNTER — Telehealth: Payer: Self-pay | Admitting: Internal Medicine

## 2017-10-26 DIAGNOSIS — E1122 Type 2 diabetes mellitus with diabetic chronic kidney disease: Secondary | ICD-10-CM | POA: Diagnosis not present

## 2017-10-26 DIAGNOSIS — Z794 Long term (current) use of insulin: Secondary | ICD-10-CM | POA: Diagnosis not present

## 2017-10-26 DIAGNOSIS — E785 Hyperlipidemia, unspecified: Secondary | ICD-10-CM | POA: Diagnosis not present

## 2017-10-26 DIAGNOSIS — Z9181 History of falling: Secondary | ICD-10-CM | POA: Diagnosis not present

## 2017-10-26 DIAGNOSIS — Z4781 Encounter for orthopedic aftercare following surgical amputation: Secondary | ICD-10-CM | POA: Diagnosis not present

## 2017-10-26 DIAGNOSIS — I5042 Chronic combined systolic (congestive) and diastolic (congestive) heart failure: Secondary | ICD-10-CM

## 2017-10-26 DIAGNOSIS — E1151 Type 2 diabetes mellitus with diabetic peripheral angiopathy without gangrene: Secondary | ICD-10-CM | POA: Diagnosis not present

## 2017-10-26 DIAGNOSIS — Z8631 Personal history of diabetic foot ulcer: Secondary | ICD-10-CM | POA: Diagnosis not present

## 2017-10-26 DIAGNOSIS — Z89512 Acquired absence of left leg below knee: Secondary | ICD-10-CM | POA: Diagnosis not present

## 2017-10-26 DIAGNOSIS — Z89421 Acquired absence of other right toe(s): Secondary | ICD-10-CM | POA: Diagnosis not present

## 2017-10-26 DIAGNOSIS — N183 Chronic kidney disease, stage 3 (moderate): Secondary | ICD-10-CM | POA: Diagnosis not present

## 2017-10-26 DIAGNOSIS — I10 Essential (primary) hypertension: Secondary | ICD-10-CM

## 2017-10-26 DIAGNOSIS — I5032 Chronic diastolic (congestive) heart failure: Secondary | ICD-10-CM | POA: Diagnosis not present

## 2017-10-26 DIAGNOSIS — I130 Hypertensive heart and chronic kidney disease with heart failure and stage 1 through stage 4 chronic kidney disease, or unspecified chronic kidney disease: Secondary | ICD-10-CM | POA: Diagnosis not present

## 2017-10-26 NOTE — Telephone Encounter (Signed)
Mark [name] called to inform the doctor that the Patients BP was 175/80 patient has had BP medication and the seconded BP was 170/75 after 45 mins

## 2017-10-26 NOTE — Telephone Encounter (Signed)
PC returned to [facility]. I left a message that I was f/u to his/her PC regarding pt's BP.  I wanted to verify whether caller was pt's home health nurse and if so, I recommended increasing Carvedilol to 12.5 mg 1.5 tabs BID but I wanted to speak with caller first. I also tried calling pt and left message on his VM that I was trying to reach him to f/u regarding his BP.

## 2017-10-27 ENCOUNTER — Telehealth: Payer: Self-pay | Admitting: Internal Medicine

## 2017-10-27 LAB — SPECIMEN STATUS REPORT

## 2017-10-27 NOTE — Telephone Encounter (Signed)
1 page, [employer] paperwork received through fax 10-27-17.

## 2017-10-27 NOTE — Telephone Encounter (Signed)
2 page, paperwork received through fax 10-27-17.

## 2017-10-27 NOTE — Telephone Encounter (Signed)
Spoke to [place] to inform dose change of Carvedilol. [facility] verified understanding. Unable to reach patient. Left message on voicemail to return call. Unable to leave message on [phone] [facility]).

## 2017-10-28 ENCOUNTER — Other Ambulatory Visit: Payer: Self-pay | Admitting: *Deleted

## 2017-10-28 DIAGNOSIS — E1122 Type 2 diabetes mellitus with diabetic chronic kidney disease: Secondary | ICD-10-CM | POA: Diagnosis not present

## 2017-10-28 DIAGNOSIS — N183 Chronic kidney disease, stage 3 (moderate): Secondary | ICD-10-CM | POA: Diagnosis not present

## 2017-10-28 DIAGNOSIS — Z794 Long term (current) use of insulin: Secondary | ICD-10-CM | POA: Diagnosis not present

## 2017-10-28 DIAGNOSIS — E785 Hyperlipidemia, unspecified: Secondary | ICD-10-CM | POA: Diagnosis not present

## 2017-10-28 DIAGNOSIS — Z9181 History of falling: Secondary | ICD-10-CM | POA: Diagnosis not present

## 2017-10-28 DIAGNOSIS — Z89421 Acquired absence of other right toe(s): Secondary | ICD-10-CM | POA: Diagnosis not present

## 2017-10-28 DIAGNOSIS — E1151 Type 2 diabetes mellitus with diabetic peripheral angiopathy without gangrene: Secondary | ICD-10-CM | POA: Diagnosis not present

## 2017-10-28 DIAGNOSIS — Z8631 Personal history of diabetic foot ulcer: Secondary | ICD-10-CM | POA: Diagnosis not present

## 2017-10-28 DIAGNOSIS — I5032 Chronic diastolic (congestive) heart failure: Secondary | ICD-10-CM | POA: Diagnosis not present

## 2017-10-28 DIAGNOSIS — Z89512 Acquired absence of left leg below knee: Secondary | ICD-10-CM | POA: Diagnosis not present

## 2017-10-28 DIAGNOSIS — I130 Hypertensive heart and chronic kidney disease with heart failure and stage 1 through stage 4 chronic kidney disease, or unspecified chronic kidney disease: Secondary | ICD-10-CM | POA: Diagnosis not present

## 2017-10-28 DIAGNOSIS — Z4781 Encounter for orthopedic aftercare following surgical amputation: Secondary | ICD-10-CM | POA: Diagnosis not present

## 2017-10-28 LAB — PLATELET COUNT: Platelets: 356 10*3/uL (ref 150–379)

## 2017-10-28 LAB — SPECIMEN STATUS REPORT

## 2017-10-28 MED ORDER — ACCU-CHEK SOFT TOUCH LANCETS MISC: 12 refills | Status: DC

## 2017-10-28 MED ORDER — CARVEDILOL 12.5 MG PO TABS: ORAL_TABLET | ORAL | 3 refills | Status: DC

## 2017-10-28 MED FILL — ACCU-CHEK SOFTCLIX LANCETS: 30 days supply | Qty: 100 | Fill #0

## 2017-10-28 NOTE — Telephone Encounter (Signed)
New rxn for Coreg reflecting increase dose sent to [facility].

## 2017-10-28 NOTE — Telephone Encounter (Signed)
Pt requested lancets to check blood sugar

## 2017-10-31 ENCOUNTER — Telehealth: Payer: Self-pay | Admitting: Internal Medicine

## 2017-10-31 DIAGNOSIS — T8189XA Other complications of procedures, not elsewhere classified, initial encounter: Secondary | ICD-10-CM | POA: Diagnosis not present

## 2017-10-31 DIAGNOSIS — A4189 Other specified sepsis: Secondary | ICD-10-CM | POA: Diagnosis not present

## 2017-10-31 NOTE — Telephone Encounter (Signed)
[facility] nurse Angle called to state that she went to see Mr Hicklin and he didn't show up or answer the door. She is going to see him tomorrow and Friday for wound care and only see him twice this week. If you want him to be seen three times as orders  please fu with her at [phone]

## 2017-11-01 DIAGNOSIS — E785 Hyperlipidemia, unspecified: Secondary | ICD-10-CM | POA: Diagnosis not present

## 2017-11-01 DIAGNOSIS — Z4781 Encounter for orthopedic aftercare following surgical amputation: Secondary | ICD-10-CM | POA: Diagnosis not present

## 2017-11-01 DIAGNOSIS — N183 Chronic kidney disease, stage 3 (moderate): Secondary | ICD-10-CM | POA: Diagnosis not present

## 2017-11-01 DIAGNOSIS — I130 Hypertensive heart and chronic kidney disease with heart failure and stage 1 through stage 4 chronic kidney disease, or unspecified chronic kidney disease: Secondary | ICD-10-CM | POA: Diagnosis not present

## 2017-11-01 DIAGNOSIS — I5032 Chronic diastolic (congestive) heart failure: Secondary | ICD-10-CM | POA: Diagnosis not present

## 2017-11-01 DIAGNOSIS — Z9181 History of falling: Secondary | ICD-10-CM | POA: Diagnosis not present

## 2017-11-01 DIAGNOSIS — E1122 Type 2 diabetes mellitus with diabetic chronic kidney disease: Secondary | ICD-10-CM | POA: Diagnosis not present

## 2017-11-01 DIAGNOSIS — Z8631 Personal history of diabetic foot ulcer: Secondary | ICD-10-CM | POA: Diagnosis not present

## 2017-11-01 DIAGNOSIS — Z794 Long term (current) use of insulin: Secondary | ICD-10-CM | POA: Diagnosis not present

## 2017-11-01 DIAGNOSIS — Z89512 Acquired absence of left leg below knee: Secondary | ICD-10-CM | POA: Diagnosis not present

## 2017-11-01 DIAGNOSIS — E1151 Type 2 diabetes mellitus with diabetic peripheral angiopathy without gangrene: Secondary | ICD-10-CM | POA: Diagnosis not present

## 2017-11-01 DIAGNOSIS — Z89421 Acquired absence of other right toe(s): Secondary | ICD-10-CM | POA: Diagnosis not present

## 2017-11-01 NOTE — Telephone Encounter (Signed)
Contacted Angel and informed her per Dr. Wynetta Emery to see him two times this week

## 2017-11-03 DIAGNOSIS — A4189 Other specified sepsis: Secondary | ICD-10-CM | POA: Diagnosis not present

## 2017-11-03 DIAGNOSIS — L0390 Cellulitis, unspecified: Secondary | ICD-10-CM | POA: Diagnosis not present

## 2017-11-03 DIAGNOSIS — A419 Sepsis, unspecified organism: Secondary | ICD-10-CM | POA: Diagnosis not present

## 2017-11-03 DIAGNOSIS — M726 Necrotizing fasciitis: Secondary | ICD-10-CM | POA: Diagnosis not present

## 2017-11-03 DIAGNOSIS — T8189XA Other complications of procedures, not elsewhere classified, initial encounter: Secondary | ICD-10-CM | POA: Diagnosis not present

## 2017-11-04 DIAGNOSIS — Z8631 Personal history of diabetic foot ulcer: Secondary | ICD-10-CM | POA: Diagnosis not present

## 2017-11-04 DIAGNOSIS — E1122 Type 2 diabetes mellitus with diabetic chronic kidney disease: Secondary | ICD-10-CM | POA: Diagnosis not present

## 2017-11-04 DIAGNOSIS — Z89421 Acquired absence of other right toe(s): Secondary | ICD-10-CM | POA: Diagnosis not present

## 2017-11-04 DIAGNOSIS — E785 Hyperlipidemia, unspecified: Secondary | ICD-10-CM | POA: Diagnosis not present

## 2017-11-04 DIAGNOSIS — I130 Hypertensive heart and chronic kidney disease with heart failure and stage 1 through stage 4 chronic kidney disease, or unspecified chronic kidney disease: Secondary | ICD-10-CM | POA: Diagnosis not present

## 2017-11-04 DIAGNOSIS — E1151 Type 2 diabetes mellitus with diabetic peripheral angiopathy without gangrene: Secondary | ICD-10-CM | POA: Diagnosis not present

## 2017-11-04 DIAGNOSIS — Z4781 Encounter for orthopedic aftercare following surgical amputation: Secondary | ICD-10-CM | POA: Diagnosis not present

## 2017-11-04 DIAGNOSIS — Z794 Long term (current) use of insulin: Secondary | ICD-10-CM | POA: Diagnosis not present

## 2017-11-04 DIAGNOSIS — N183 Chronic kidney disease, stage 3 (moderate): Secondary | ICD-10-CM | POA: Diagnosis not present

## 2017-11-04 DIAGNOSIS — Z9181 History of falling: Secondary | ICD-10-CM | POA: Diagnosis not present

## 2017-11-04 DIAGNOSIS — Z89512 Acquired absence of left leg below knee: Secondary | ICD-10-CM | POA: Diagnosis not present

## 2017-11-04 DIAGNOSIS — I5032 Chronic diastolic (congestive) heart failure: Secondary | ICD-10-CM | POA: Diagnosis not present

## 2017-11-05 DIAGNOSIS — M7542 Impingement syndrome of left shoulder: Secondary | ICD-10-CM | POA: Diagnosis not present

## 2017-11-05 DIAGNOSIS — I5032 Chronic diastolic (congestive) heart failure: Secondary | ICD-10-CM | POA: Diagnosis not present

## 2017-11-05 DIAGNOSIS — M7541 Impingement syndrome of right shoulder: Secondary | ICD-10-CM | POA: Diagnosis not present

## 2017-11-05 DIAGNOSIS — N184 Chronic kidney disease, stage 4 (severe): Secondary | ICD-10-CM | POA: Diagnosis not present

## 2017-11-05 DIAGNOSIS — S88119A Complete traumatic amputation at level between knee and ankle, unspecified lower leg, initial encounter: Secondary | ICD-10-CM | POA: Diagnosis not present

## 2017-11-07 DIAGNOSIS — Z794 Long term (current) use of insulin: Secondary | ICD-10-CM | POA: Diagnosis not present

## 2017-11-07 DIAGNOSIS — E1151 Type 2 diabetes mellitus with diabetic peripheral angiopathy without gangrene: Secondary | ICD-10-CM | POA: Diagnosis not present

## 2017-11-07 DIAGNOSIS — Z89421 Acquired absence of other right toe(s): Secondary | ICD-10-CM | POA: Diagnosis not present

## 2017-11-07 DIAGNOSIS — Z8631 Personal history of diabetic foot ulcer: Secondary | ICD-10-CM | POA: Diagnosis not present

## 2017-11-07 DIAGNOSIS — Z89512 Acquired absence of left leg below knee: Secondary | ICD-10-CM | POA: Diagnosis not present

## 2017-11-07 DIAGNOSIS — I5032 Chronic diastolic (congestive) heart failure: Secondary | ICD-10-CM | POA: Diagnosis not present

## 2017-11-07 DIAGNOSIS — Z9181 History of falling: Secondary | ICD-10-CM | POA: Diagnosis not present

## 2017-11-07 DIAGNOSIS — I130 Hypertensive heart and chronic kidney disease with heart failure and stage 1 through stage 4 chronic kidney disease, or unspecified chronic kidney disease: Secondary | ICD-10-CM | POA: Diagnosis not present

## 2017-11-07 DIAGNOSIS — N183 Chronic kidney disease, stage 3 (moderate): Secondary | ICD-10-CM | POA: Diagnosis not present

## 2017-11-07 DIAGNOSIS — Z4781 Encounter for orthopedic aftercare following surgical amputation: Secondary | ICD-10-CM | POA: Diagnosis not present

## 2017-11-07 DIAGNOSIS — E785 Hyperlipidemia, unspecified: Secondary | ICD-10-CM | POA: Diagnosis not present

## 2017-11-07 DIAGNOSIS — E1122 Type 2 diabetes mellitus with diabetic chronic kidney disease: Secondary | ICD-10-CM | POA: Diagnosis not present

## 2017-11-08 ENCOUNTER — Other Ambulatory Visit: Payer: Self-pay | Admitting: Pharmacist

## 2017-11-08 ENCOUNTER — Other Ambulatory Visit: Payer: Self-pay | Admitting: Internal Medicine

## 2017-11-08 ENCOUNTER — Other Ambulatory Visit: Payer: Self-pay | Admitting: Neurology

## 2017-11-08 DIAGNOSIS — I10 Essential (primary) hypertension: Secondary | ICD-10-CM

## 2017-11-08 DIAGNOSIS — R569 Unspecified convulsions: Secondary | ICD-10-CM

## 2017-11-09 ENCOUNTER — Ambulatory Visit (INDEPENDENT_AMBULATORY_CARE_PROVIDER_SITE_OTHER): Payer: Medicare Other | Admitting: Orthopaedic Surgery

## 2017-11-09 ENCOUNTER — Ambulatory Visit (INDEPENDENT_AMBULATORY_CARE_PROVIDER_SITE_OTHER): Payer: Medicare Other

## 2017-11-09 ENCOUNTER — Encounter (INDEPENDENT_AMBULATORY_CARE_PROVIDER_SITE_OTHER): Payer: Self-pay | Admitting: Orthopaedic Surgery

## 2017-11-09 VITALS — BP 156/74 | HR 73

## 2017-11-09 DIAGNOSIS — Z4781 Encounter for orthopedic aftercare following surgical amputation: Secondary | ICD-10-CM | POA: Diagnosis not present

## 2017-11-09 DIAGNOSIS — N183 Chronic kidney disease, stage 3 (moderate): Secondary | ICD-10-CM | POA: Diagnosis not present

## 2017-11-09 DIAGNOSIS — M86179 Other acute osteomyelitis, unspecified ankle and foot: Secondary | ICD-10-CM

## 2017-11-09 DIAGNOSIS — Z794 Long term (current) use of insulin: Secondary | ICD-10-CM | POA: Diagnosis not present

## 2017-11-09 DIAGNOSIS — E1122 Type 2 diabetes mellitus with diabetic chronic kidney disease: Secondary | ICD-10-CM | POA: Diagnosis not present

## 2017-11-09 DIAGNOSIS — Z9181 History of falling: Secondary | ICD-10-CM | POA: Diagnosis not present

## 2017-11-09 DIAGNOSIS — I5032 Chronic diastolic (congestive) heart failure: Secondary | ICD-10-CM | POA: Diagnosis not present

## 2017-11-09 DIAGNOSIS — Z8631 Personal history of diabetic foot ulcer: Secondary | ICD-10-CM | POA: Diagnosis not present

## 2017-11-09 DIAGNOSIS — E1151 Type 2 diabetes mellitus with diabetic peripheral angiopathy without gangrene: Secondary | ICD-10-CM | POA: Diagnosis not present

## 2017-11-09 DIAGNOSIS — Z89421 Acquired absence of other right toe(s): Secondary | ICD-10-CM | POA: Diagnosis not present

## 2017-11-09 DIAGNOSIS — I130 Hypertensive heart and chronic kidney disease with heart failure and stage 1 through stage 4 chronic kidney disease, or unspecified chronic kidney disease: Secondary | ICD-10-CM | POA: Diagnosis not present

## 2017-11-09 DIAGNOSIS — E785 Hyperlipidemia, unspecified: Secondary | ICD-10-CM | POA: Diagnosis not present

## 2017-11-09 DIAGNOSIS — Z89512 Acquired absence of left leg below knee: Secondary | ICD-10-CM | POA: Diagnosis not present
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
## 2017-11-10 ENCOUNTER — Encounter (INDEPENDENT_AMBULATORY_CARE_PROVIDER_SITE_OTHER): Payer: Self-pay | Admitting: Orthopaedic Surgery

## 2017-11-10 DIAGNOSIS — N184 Chronic kidney disease, stage 4 (severe): Secondary | ICD-10-CM | POA: Diagnosis not present

## 2017-11-10 DIAGNOSIS — E1129 Type 2 diabetes mellitus with other diabetic kidney complication: Secondary | ICD-10-CM | POA: Diagnosis not present

## 2017-11-10 DIAGNOSIS — E1122 Type 2 diabetes mellitus with diabetic chronic kidney disease: Secondary | ICD-10-CM | POA: Diagnosis not present

## 2017-11-10 DIAGNOSIS — I129 Hypertensive chronic kidney disease with stage 1 through stage 4 chronic kidney disease, or unspecified chronic kidney disease: Secondary | ICD-10-CM | POA: Diagnosis not present

## 2017-11-10 DIAGNOSIS — N179 Acute kidney failure, unspecified: Secondary | ICD-10-CM | POA: Diagnosis not present

## 2017-11-10 DIAGNOSIS — D631 Anemia in chronic kidney disease: Secondary | ICD-10-CM | POA: Diagnosis not present

## 2017-11-10 DIAGNOSIS — N2581 Secondary hyperparathyroidism of renal origin: Secondary | ICD-10-CM | POA: Diagnosis not present

## 2017-11-10 DIAGNOSIS — R809 Proteinuria, unspecified: Secondary | ICD-10-CM | POA: Diagnosis not present

## 2017-11-11 DIAGNOSIS — Z9181 History of falling: Secondary | ICD-10-CM | POA: Diagnosis not present

## 2017-11-11 DIAGNOSIS — Z89512 Acquired absence of left leg below knee: Secondary | ICD-10-CM | POA: Diagnosis not present

## 2017-11-11 DIAGNOSIS — Z89421 Acquired absence of other right toe(s): Secondary | ICD-10-CM | POA: Diagnosis not present

## 2017-11-11 DIAGNOSIS — Z4781 Encounter for orthopedic aftercare following surgical amputation: Secondary | ICD-10-CM | POA: Diagnosis not present

## 2017-11-11 DIAGNOSIS — E1151 Type 2 diabetes mellitus with diabetic peripheral angiopathy without gangrene: Secondary | ICD-10-CM | POA: Diagnosis not present

## 2017-11-11 DIAGNOSIS — Z8631 Personal history of diabetic foot ulcer: Secondary | ICD-10-CM | POA: Diagnosis not present

## 2017-11-11 DIAGNOSIS — N183 Chronic kidney disease, stage 3 (moderate): Secondary | ICD-10-CM | POA: Diagnosis not present

## 2017-11-11 DIAGNOSIS — E785 Hyperlipidemia, unspecified: Secondary | ICD-10-CM | POA: Diagnosis not present

## 2017-11-11 DIAGNOSIS — I5032 Chronic diastolic (congestive) heart failure: Secondary | ICD-10-CM | POA: Diagnosis not present

## 2017-11-11 DIAGNOSIS — E1122 Type 2 diabetes mellitus with diabetic chronic kidney disease: Secondary | ICD-10-CM | POA: Diagnosis not present

## 2017-11-11 DIAGNOSIS — I130 Hypertensive heart and chronic kidney disease with heart failure and stage 1 through stage 4 chronic kidney disease, or unspecified chronic kidney disease: Secondary | ICD-10-CM | POA: Diagnosis not present

## 2017-11-11 DIAGNOSIS — Z794 Long term (current) use of insulin: Secondary | ICD-10-CM | POA: Diagnosis not present

## 2017-11-14 ENCOUNTER — Ambulatory Visit: Payer: Medicare Other | Admitting: Internal Medicine

## 2017-11-14 DIAGNOSIS — Z89421 Acquired absence of other right toe(s): Secondary | ICD-10-CM | POA: Diagnosis not present

## 2017-11-14 DIAGNOSIS — N183 Chronic kidney disease, stage 3 (moderate): Secondary | ICD-10-CM | POA: Diagnosis not present

## 2017-11-14 DIAGNOSIS — Z794 Long term (current) use of insulin: Secondary | ICD-10-CM | POA: Diagnosis not present

## 2017-11-14 DIAGNOSIS — E1151 Type 2 diabetes mellitus with diabetic peripheral angiopathy without gangrene: Secondary | ICD-10-CM | POA: Diagnosis not present

## 2017-11-14 DIAGNOSIS — E1122 Type 2 diabetes mellitus with diabetic chronic kidney disease: Secondary | ICD-10-CM | POA: Diagnosis not present

## 2017-11-14 DIAGNOSIS — E785 Hyperlipidemia, unspecified: Secondary | ICD-10-CM | POA: Diagnosis not present

## 2017-11-14 DIAGNOSIS — Z4781 Encounter for orthopedic aftercare following surgical amputation: Secondary | ICD-10-CM | POA: Diagnosis not present

## 2017-11-14 DIAGNOSIS — I130 Hypertensive heart and chronic kidney disease with heart failure and stage 1 through stage 4 chronic kidney disease, or unspecified chronic kidney disease: Secondary | ICD-10-CM | POA: Diagnosis not present

## 2017-11-14 DIAGNOSIS — I5032 Chronic diastolic (congestive) heart failure: Secondary | ICD-10-CM | POA: Diagnosis not present

## 2017-11-14 DIAGNOSIS — Z9181 History of falling: Secondary | ICD-10-CM | POA: Diagnosis not present

## 2017-11-14 DIAGNOSIS — Z89512 Acquired absence of left leg below knee: Secondary | ICD-10-CM | POA: Diagnosis not present

## 2017-11-14 DIAGNOSIS — Z8631 Personal history of diabetic foot ulcer: Secondary | ICD-10-CM | POA: Diagnosis not present

## 2017-11-16 DIAGNOSIS — Z89421 Acquired absence of other right toe(s): Secondary | ICD-10-CM | POA: Diagnosis not present

## 2017-11-16 DIAGNOSIS — I5032 Chronic diastolic (congestive) heart failure: Secondary | ICD-10-CM | POA: Diagnosis not present

## 2017-11-16 DIAGNOSIS — N183 Chronic kidney disease, stage 3 (moderate): Secondary | ICD-10-CM | POA: Diagnosis not present

## 2017-11-16 DIAGNOSIS — I130 Hypertensive heart and chronic kidney disease with heart failure and stage 1 through stage 4 chronic kidney disease, or unspecified chronic kidney disease: Secondary | ICD-10-CM | POA: Diagnosis not present

## 2017-11-16 DIAGNOSIS — Z8631 Personal history of diabetic foot ulcer: Secondary | ICD-10-CM | POA: Diagnosis not present

## 2017-11-16 DIAGNOSIS — Z89512 Acquired absence of left leg below knee: Secondary | ICD-10-CM | POA: Diagnosis not present

## 2017-11-16 DIAGNOSIS — E1151 Type 2 diabetes mellitus with diabetic peripheral angiopathy without gangrene: Secondary | ICD-10-CM | POA: Diagnosis not present

## 2017-11-16 DIAGNOSIS — Z794 Long term (current) use of insulin: Secondary | ICD-10-CM | POA: Diagnosis not present

## 2017-11-16 DIAGNOSIS — Z9181 History of falling: Secondary | ICD-10-CM | POA: Diagnosis not present

## 2017-11-16 DIAGNOSIS — Z4781 Encounter for orthopedic aftercare following surgical amputation: Secondary | ICD-10-CM | POA: Diagnosis not present

## 2017-11-16 DIAGNOSIS — E1122 Type 2 diabetes mellitus with diabetic chronic kidney disease: Secondary | ICD-10-CM | POA: Diagnosis not present

## 2017-11-16 DIAGNOSIS — E785 Hyperlipidemia, unspecified: Secondary | ICD-10-CM | POA: Diagnosis not present

## 2017-11-18 ENCOUNTER — Ambulatory Visit (INDEPENDENT_AMBULATORY_CARE_PROVIDER_SITE_OTHER): Payer: Medicare Other | Admitting: Orthopaedic Surgery

## 2017-11-18 ENCOUNTER — Encounter (INDEPENDENT_AMBULATORY_CARE_PROVIDER_SITE_OTHER): Payer: Self-pay | Admitting: Orthopaedic Surgery

## 2017-11-18 ENCOUNTER — Telehealth (INDEPENDENT_AMBULATORY_CARE_PROVIDER_SITE_OTHER): Payer: Self-pay | Admitting: Orthopaedic Surgery

## 2017-11-18 VITALS — BP 153/76 | HR 68

## 2017-11-18 DIAGNOSIS — M86179 Other acute osteomyelitis, unspecified ankle and foot: Secondary | ICD-10-CM

## 2017-11-18 DIAGNOSIS — E1122 Type 2 diabetes mellitus with diabetic chronic kidney disease: Secondary | ICD-10-CM | POA: Diagnosis not present

## 2017-11-18 DIAGNOSIS — I130 Hypertensive heart and chronic kidney disease with heart failure and stage 1 through stage 4 chronic kidney disease, or unspecified chronic kidney disease: Secondary | ICD-10-CM | POA: Diagnosis not present

## 2017-11-18 DIAGNOSIS — Z89512 Acquired absence of left leg below knee: Secondary | ICD-10-CM | POA: Diagnosis not present

## 2017-11-18 DIAGNOSIS — E785 Hyperlipidemia, unspecified: Secondary | ICD-10-CM | POA: Diagnosis not present

## 2017-11-18 DIAGNOSIS — Z4781 Encounter for orthopedic aftercare following surgical amputation: Secondary | ICD-10-CM | POA: Diagnosis not present

## 2017-11-18 DIAGNOSIS — I5032 Chronic diastolic (congestive) heart failure: Secondary | ICD-10-CM | POA: Diagnosis not present

## 2017-11-18 DIAGNOSIS — Z9181 History of falling: Secondary | ICD-10-CM | POA: Diagnosis not present

## 2017-11-18 DIAGNOSIS — Z794 Long term (current) use of insulin: Secondary | ICD-10-CM | POA: Diagnosis not present

## 2017-11-18 DIAGNOSIS — Z8631 Personal history of diabetic foot ulcer: Secondary | ICD-10-CM | POA: Diagnosis not present

## 2017-11-18 DIAGNOSIS — N183 Chronic kidney disease, stage 3 (moderate): Secondary | ICD-10-CM | POA: Diagnosis not present

## 2017-11-18 DIAGNOSIS — E1151 Type 2 diabetes mellitus with diabetic peripheral angiopathy without gangrene: Secondary | ICD-10-CM | POA: Diagnosis not present

## 2017-11-18 DIAGNOSIS — Z89421 Acquired absence of other right toe(s): Secondary | ICD-10-CM | POA: Diagnosis not present

## 2017-11-18 NOTE — Telephone Encounter (Signed)
Error - transferred to triage
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
## 2017-11-21 DIAGNOSIS — Z8631 Personal history of diabetic foot ulcer: Secondary | ICD-10-CM | POA: Diagnosis not present

## 2017-11-21 DIAGNOSIS — Z4781 Encounter for orthopedic aftercare following surgical amputation: Secondary | ICD-10-CM | POA: Diagnosis not present

## 2017-11-21 DIAGNOSIS — E785 Hyperlipidemia, unspecified: Secondary | ICD-10-CM | POA: Diagnosis not present

## 2017-11-21 DIAGNOSIS — E1151 Type 2 diabetes mellitus with diabetic peripheral angiopathy without gangrene: Secondary | ICD-10-CM | POA: Diagnosis not present

## 2017-11-21 DIAGNOSIS — I130 Hypertensive heart and chronic kidney disease with heart failure and stage 1 through stage 4 chronic kidney disease, or unspecified chronic kidney disease: Secondary | ICD-10-CM | POA: Diagnosis not present

## 2017-11-21 DIAGNOSIS — Z89421 Acquired absence of other right toe(s): Secondary | ICD-10-CM | POA: Diagnosis not present

## 2017-11-21 DIAGNOSIS — Z9181 History of falling: Secondary | ICD-10-CM | POA: Diagnosis not present

## 2017-11-21 DIAGNOSIS — I5032 Chronic diastolic (congestive) heart failure: Secondary | ICD-10-CM | POA: Diagnosis not present

## 2017-11-21 DIAGNOSIS — Z794 Long term (current) use of insulin: Secondary | ICD-10-CM | POA: Diagnosis not present

## 2017-11-21 DIAGNOSIS — N183 Chronic kidney disease, stage 3 (moderate): Secondary | ICD-10-CM | POA: Diagnosis not present

## 2017-11-21 DIAGNOSIS — Z89512 Acquired absence of left leg below knee: Secondary | ICD-10-CM | POA: Diagnosis not present

## 2017-11-21 DIAGNOSIS — E1122 Type 2 diabetes mellitus with diabetic chronic kidney disease: Secondary | ICD-10-CM | POA: Diagnosis not present

## 2017-11-23 DIAGNOSIS — Z9181 History of falling: Secondary | ICD-10-CM | POA: Diagnosis not present

## 2017-11-23 DIAGNOSIS — Z89512 Acquired absence of left leg below knee: Secondary | ICD-10-CM | POA: Diagnosis not present

## 2017-11-23 DIAGNOSIS — N183 Chronic kidney disease, stage 3 (moderate): Secondary | ICD-10-CM | POA: Diagnosis not present

## 2017-11-23 DIAGNOSIS — E1151 Type 2 diabetes mellitus with diabetic peripheral angiopathy without gangrene: Secondary | ICD-10-CM | POA: Diagnosis not present

## 2017-11-23 DIAGNOSIS — Z794 Long term (current) use of insulin: Secondary | ICD-10-CM | POA: Diagnosis not present

## 2017-11-23 DIAGNOSIS — Z89421 Acquired absence of other right toe(s): Secondary | ICD-10-CM | POA: Diagnosis not present

## 2017-11-23 DIAGNOSIS — Z4781 Encounter for orthopedic aftercare following surgical amputation: Secondary | ICD-10-CM | POA: Diagnosis not present

## 2017-11-23 DIAGNOSIS — I130 Hypertensive heart and chronic kidney disease with heart failure and stage 1 through stage 4 chronic kidney disease, or unspecified chronic kidney disease: Secondary | ICD-10-CM | POA: Diagnosis not present

## 2017-11-23 DIAGNOSIS — Z8631 Personal history of diabetic foot ulcer: Secondary | ICD-10-CM | POA: Diagnosis not present

## 2017-11-23 DIAGNOSIS — E1122 Type 2 diabetes mellitus with diabetic chronic kidney disease: Secondary | ICD-10-CM | POA: Diagnosis not present

## 2017-11-23 DIAGNOSIS — E785 Hyperlipidemia, unspecified: Secondary | ICD-10-CM | POA: Diagnosis not present

## 2017-11-23 DIAGNOSIS — I5032 Chronic diastolic (congestive) heart failure: Secondary | ICD-10-CM | POA: Diagnosis not present

## 2017-11-25 ENCOUNTER — Telehealth: Payer: Self-pay | Admitting: Internal Medicine

## 2017-11-25 DIAGNOSIS — N183 Chronic kidney disease, stage 3 (moderate): Secondary | ICD-10-CM | POA: Diagnosis not present

## 2017-11-25 DIAGNOSIS — Z89421 Acquired absence of other right toe(s): Secondary | ICD-10-CM | POA: Diagnosis not present

## 2017-11-25 DIAGNOSIS — E1122 Type 2 diabetes mellitus with diabetic chronic kidney disease: Secondary | ICD-10-CM | POA: Diagnosis not present

## 2017-11-25 DIAGNOSIS — E1151 Type 2 diabetes mellitus with diabetic peripheral angiopathy without gangrene: Secondary | ICD-10-CM | POA: Diagnosis not present

## 2017-11-25 DIAGNOSIS — Z8631 Personal history of diabetic foot ulcer: Secondary | ICD-10-CM | POA: Diagnosis not present

## 2017-11-25 DIAGNOSIS — Z9181 History of falling: Secondary | ICD-10-CM | POA: Diagnosis not present

## 2017-11-25 DIAGNOSIS — I5032 Chronic diastolic (congestive) heart failure: Secondary | ICD-10-CM | POA: Diagnosis not present

## 2017-11-25 DIAGNOSIS — Z794 Long term (current) use of insulin: Secondary | ICD-10-CM | POA: Diagnosis not present

## 2017-11-25 DIAGNOSIS — Z89512 Acquired absence of left leg below knee: Secondary | ICD-10-CM | POA: Diagnosis not present

## 2017-11-25 DIAGNOSIS — E785 Hyperlipidemia, unspecified: Secondary | ICD-10-CM | POA: Diagnosis not present

## 2017-11-25 DIAGNOSIS — Z4781 Encounter for orthopedic aftercare following surgical amputation: Secondary | ICD-10-CM | POA: Diagnosis not present

## 2017-11-25 DIAGNOSIS — I130 Hypertensive heart and chronic kidney disease with heart failure and stage 1 through stage 4 chronic kidney disease, or unspecified chronic kidney disease: Secondary | ICD-10-CM | POA: Diagnosis not present

## 2017-11-25 NOTE — Telephone Encounter (Signed)
9 page, paperwork received through fax 11-25-17.

## 2017-11-25 NOTE — Telephone Encounter (Signed)
13 page, paperwork received through fax.

## 2017-11-28 DIAGNOSIS — E1151 Type 2 diabetes mellitus with diabetic peripheral angiopathy without gangrene: Secondary | ICD-10-CM | POA: Diagnosis not present

## 2017-11-28 DIAGNOSIS — Z4781 Encounter for orthopedic aftercare following surgical amputation: Secondary | ICD-10-CM | POA: Diagnosis not present

## 2017-11-28 DIAGNOSIS — N183 Chronic kidney disease, stage 3 (moderate): Secondary | ICD-10-CM | POA: Diagnosis not present

## 2017-11-28 DIAGNOSIS — Z89421 Acquired absence of other right toe(s): Secondary | ICD-10-CM | POA: Diagnosis not present

## 2017-11-28 DIAGNOSIS — Z8631 Personal history of diabetic foot ulcer: Secondary | ICD-10-CM | POA: Diagnosis not present

## 2017-11-28 DIAGNOSIS — I130 Hypertensive heart and chronic kidney disease with heart failure and stage 1 through stage 4 chronic kidney disease, or unspecified chronic kidney disease: Secondary | ICD-10-CM | POA: Diagnosis not present

## 2017-11-28 DIAGNOSIS — E1122 Type 2 diabetes mellitus with diabetic chronic kidney disease: Secondary | ICD-10-CM | POA: Diagnosis not present

## 2017-11-28 DIAGNOSIS — Z794 Long term (current) use of insulin: Secondary | ICD-10-CM | POA: Diagnosis not present

## 2017-11-28 DIAGNOSIS — Z89512 Acquired absence of left leg below knee: Secondary | ICD-10-CM | POA: Diagnosis not present

## 2017-11-28 DIAGNOSIS — I5032 Chronic diastolic (congestive) heart failure: Secondary | ICD-10-CM | POA: Diagnosis not present

## 2017-11-28 DIAGNOSIS — Z9181 History of falling: Secondary | ICD-10-CM | POA: Diagnosis not present

## 2017-11-28 DIAGNOSIS — E785 Hyperlipidemia, unspecified: Secondary | ICD-10-CM | POA: Diagnosis not present

## 2017-11-30 ENCOUNTER — Encounter: Payer: Self-pay | Admitting: Internal Medicine

## 2017-11-30 ENCOUNTER — Ambulatory Visit (INDEPENDENT_AMBULATORY_CARE_PROVIDER_SITE_OTHER): Payer: Medicare Other | Admitting: Orthopaedic Surgery

## 2017-11-30 DIAGNOSIS — E1122 Type 2 diabetes mellitus with diabetic chronic kidney disease: Secondary | ICD-10-CM | POA: Diagnosis not present

## 2017-11-30 DIAGNOSIS — I130 Hypertensive heart and chronic kidney disease with heart failure and stage 1 through stage 4 chronic kidney disease, or unspecified chronic kidney disease: Secondary | ICD-10-CM | POA: Diagnosis not present

## 2017-11-30 DIAGNOSIS — I5032 Chronic diastolic (congestive) heart failure: Secondary | ICD-10-CM | POA: Diagnosis not present

## 2017-11-30 DIAGNOSIS — Z89421 Acquired absence of other right toe(s): Secondary | ICD-10-CM | POA: Diagnosis not present

## 2017-11-30 DIAGNOSIS — Z9181 History of falling: Secondary | ICD-10-CM | POA: Diagnosis not present

## 2017-11-30 DIAGNOSIS — Z4781 Encounter for orthopedic aftercare following surgical amputation: Secondary | ICD-10-CM | POA: Diagnosis not present

## 2017-11-30 DIAGNOSIS — E785 Hyperlipidemia, unspecified: Secondary | ICD-10-CM | POA: Diagnosis not present

## 2017-11-30 DIAGNOSIS — E1151 Type 2 diabetes mellitus with diabetic peripheral angiopathy without gangrene: Secondary | ICD-10-CM | POA: Diagnosis not present

## 2017-11-30 DIAGNOSIS — Z794 Long term (current) use of insulin: Secondary | ICD-10-CM | POA: Diagnosis not present

## 2017-11-30 DIAGNOSIS — Z8631 Personal history of diabetic foot ulcer: Secondary | ICD-10-CM | POA: Diagnosis not present

## 2017-11-30 DIAGNOSIS — Z89512 Acquired absence of left leg below knee: Secondary | ICD-10-CM | POA: Diagnosis not present

## 2017-11-30 DIAGNOSIS — N183 Chronic kidney disease, stage 3 (moderate): Secondary | ICD-10-CM | POA: Diagnosis not present

## 2017-11-30 NOTE — Progress Notes (Addendum)
Received a copy of note from patient's recent visit with Dr. Lorene Dy, of [facility] nephrology.  Blood test done on that visit revealed iron level of 35, iron saturation of 24%, ferritin 182 and iron binding capacity of 145.  Hemoglobin 8.2/hematocrit 25 with MCV of 88.  Hemoglobin A1c of 6.7.  No ACE/carb recommended due to multiple episodes of AK I/CKD.  Vitamin D level of 14.  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  Lambda Typing Appear Increased.  Will f/u with the nephrologist to see if referral to oncologist needed.

## 2017-12-01 ENCOUNTER — Ambulatory Visit (INDEPENDENT_AMBULATORY_CARE_PROVIDER_SITE_OTHER): Payer: Medicare Other | Admitting: Neurology

## 2017-12-01 ENCOUNTER — Encounter: Payer: Self-pay | Admitting: Neurology

## 2017-12-01 VITALS — BP 155/74 | HR 77 | Ht 74.0 in | Wt 232.5 lb

## 2017-12-01 DIAGNOSIS — G40909 Epilepsy, unspecified, not intractable, without status epilepticus: Secondary | ICD-10-CM | POA: Diagnosis not present

## 2017-12-01 DIAGNOSIS — A4189 Other specified sepsis: Secondary | ICD-10-CM | POA: Diagnosis not present

## 2017-12-01 DIAGNOSIS — T8189XA Other complications of procedures, not elsewhere classified, initial encounter: Secondary | ICD-10-CM | POA: Diagnosis not present
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
## 2017-12-02 ENCOUNTER — Ambulatory Visit (INDEPENDENT_AMBULATORY_CARE_PROVIDER_SITE_OTHER): Payer: Medicare Other | Admitting: Orthopaedic Surgery

## 2017-12-02 ENCOUNTER — Telehealth (INDEPENDENT_AMBULATORY_CARE_PROVIDER_SITE_OTHER): Payer: Self-pay | Admitting: Orthopaedic Surgery

## 2017-12-02 ENCOUNTER — Encounter (INDEPENDENT_AMBULATORY_CARE_PROVIDER_SITE_OTHER): Payer: Self-pay | Admitting: Orthopaedic Surgery

## 2017-12-02 VITALS — BP 162/83 | HR 63

## 2017-12-02 DIAGNOSIS — Z89421 Acquired absence of other right toe(s): Secondary | ICD-10-CM | POA: Insufficient documentation

## 2017-12-02 DIAGNOSIS — E1151 Type 2 diabetes mellitus with diabetic peripheral angiopathy without gangrene: Secondary | ICD-10-CM | POA: Diagnosis not present

## 2017-12-02 DIAGNOSIS — Z4781 Encounter for orthopedic aftercare following surgical amputation: Secondary | ICD-10-CM | POA: Diagnosis not present

## 2017-12-02 DIAGNOSIS — Z794 Long term (current) use of insulin: Secondary | ICD-10-CM | POA: Diagnosis not present

## 2017-12-02 DIAGNOSIS — E1122 Type 2 diabetes mellitus with diabetic chronic kidney disease: Secondary | ICD-10-CM | POA: Diagnosis not present

## 2017-12-02 DIAGNOSIS — E785 Hyperlipidemia, unspecified: Secondary | ICD-10-CM | POA: Diagnosis not present

## 2017-12-02 DIAGNOSIS — Z9181 History of falling: Secondary | ICD-10-CM | POA: Diagnosis not present

## 2017-12-02 DIAGNOSIS — Z8631 Personal history of diabetic foot ulcer: Secondary | ICD-10-CM | POA: Diagnosis not present

## 2017-12-02 DIAGNOSIS — I130 Hypertensive heart and chronic kidney disease with heart failure and stage 1 through stage 4 chronic kidney disease, or unspecified chronic kidney disease: Secondary | ICD-10-CM | POA: Diagnosis not present

## 2017-12-02 DIAGNOSIS — I5032 Chronic diastolic (congestive) heart failure: Secondary | ICD-10-CM | POA: Diagnosis not present

## 2017-12-02 DIAGNOSIS — Z89512 Acquired absence of left leg below knee: Secondary | ICD-10-CM | POA: Diagnosis not present

## 2017-12-02 DIAGNOSIS — Z89431 Acquired absence of right foot: Secondary | ICD-10-CM

## 2017-12-02 DIAGNOSIS — N183 Chronic kidney disease, stage 3 (moderate): Secondary | ICD-10-CM | POA: Diagnosis not present

## 2017-12-02 NOTE — Telephone Encounter (Signed)
They can check him 2 times a week for 3 weeks.  Call me if they have any problems.  The dressing on his own daily the days that they do not go out to see him.  Thank you

## 2017-12-02 NOTE — Telephone Encounter (Signed)
Angel from [facility] called this morning requesting to extend the Wound Care orders.  Patient is up for recert.  CB#[phone].  Thank you.

## 2017-12-02 NOTE — Telephone Encounter (Signed)
I called Mark [name] to advise on wound care. I did explain patient was seen in the office this afternoon and the [facility] is going to be discontinued. They were going out to do VAC changes 3 x a week. She would like to know how often you would like for them to go out to do dressing changes.  Please advise.
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
## 2017-12-02 NOTE — Telephone Encounter (Signed)
Ok thanks 

## 2017-12-02 NOTE — Telephone Encounter (Signed)
Please advise. OK for orders?

## 2017-12-04 ENCOUNTER — Encounter: Payer: Self-pay | Admitting: Internal Medicine

## 2017-12-04 NOTE — Progress Notes (Signed)
I did hear back from nephrologist, Dr. Lorene Dy, 11/30/2017.  He stated that he will be referring pt to oncology for positive SPEP.

## 2017-12-05 DIAGNOSIS — Z9181 History of falling: Secondary | ICD-10-CM | POA: Diagnosis not present

## 2017-12-05 DIAGNOSIS — Z89421 Acquired absence of other right toe(s): Secondary | ICD-10-CM | POA: Diagnosis not present

## 2017-12-05 DIAGNOSIS — Z89512 Acquired absence of left leg below knee: Secondary | ICD-10-CM | POA: Diagnosis not present

## 2017-12-05 DIAGNOSIS — Z8631 Personal history of diabetic foot ulcer: Secondary | ICD-10-CM | POA: Diagnosis not present

## 2017-12-05 DIAGNOSIS — I130 Hypertensive heart and chronic kidney disease with heart failure and stage 1 through stage 4 chronic kidney disease, or unspecified chronic kidney disease: Secondary | ICD-10-CM | POA: Diagnosis not present

## 2017-12-05 DIAGNOSIS — E1122 Type 2 diabetes mellitus with diabetic chronic kidney disease: Secondary | ICD-10-CM | POA: Diagnosis not present

## 2017-12-05 DIAGNOSIS — I5032 Chronic diastolic (congestive) heart failure: Secondary | ICD-10-CM | POA: Diagnosis not present

## 2017-12-05 DIAGNOSIS — N183 Chronic kidney disease, stage 3 (moderate): Secondary | ICD-10-CM | POA: Diagnosis not present

## 2017-12-05 DIAGNOSIS — E1151 Type 2 diabetes mellitus with diabetic peripheral angiopathy without gangrene: Secondary | ICD-10-CM | POA: Diagnosis not present

## 2017-12-05 DIAGNOSIS — Z794 Long term (current) use of insulin: Secondary | ICD-10-CM | POA: Diagnosis not present

## 2017-12-05 DIAGNOSIS — Z4781 Encounter for orthopedic aftercare following surgical amputation: Secondary | ICD-10-CM | POA: Diagnosis not present

## 2017-12-05 DIAGNOSIS — E785 Hyperlipidemia, unspecified: Secondary | ICD-10-CM | POA: Diagnosis not present

## 2017-12-06 DIAGNOSIS — N184 Chronic kidney disease, stage 4 (severe): Secondary | ICD-10-CM | POA: Diagnosis not present

## 2017-12-06 DIAGNOSIS — I5032 Chronic diastolic (congestive) heart failure: Secondary | ICD-10-CM | POA: Diagnosis not present

## 2017-12-06 DIAGNOSIS — S88119A Complete traumatic amputation at level between knee and ankle, unspecified lower leg, initial encounter: Secondary | ICD-10-CM | POA: Diagnosis not present

## 2017-12-06 DIAGNOSIS — M7542 Impingement syndrome of left shoulder: Secondary | ICD-10-CM | POA: Diagnosis not present

## 2017-12-06 DIAGNOSIS — M7541 Impingement syndrome of right shoulder: Secondary | ICD-10-CM | POA: Diagnosis not present

## 2017-12-06 NOTE — Telephone Encounter (Signed)
Orders faxed to [facility].  

## 2017-12-07 ENCOUNTER — Other Ambulatory Visit (HOSPITAL_COMMUNITY): Payer: Self-pay | Admitting: *Deleted

## 2017-12-07 DIAGNOSIS — Z4781 Encounter for orthopedic aftercare following surgical amputation: Secondary | ICD-10-CM | POA: Diagnosis not present

## 2017-12-07 DIAGNOSIS — Z89512 Acquired absence of left leg below knee: Secondary | ICD-10-CM | POA: Diagnosis not present

## 2017-12-07 DIAGNOSIS — Z89421 Acquired absence of other right toe(s): Secondary | ICD-10-CM | POA: Diagnosis not present

## 2017-12-07 DIAGNOSIS — Z9181 History of falling: Secondary | ICD-10-CM | POA: Diagnosis not present

## 2017-12-07 DIAGNOSIS — E785 Hyperlipidemia, unspecified: Secondary | ICD-10-CM | POA: Diagnosis not present

## 2017-12-07 DIAGNOSIS — Z8631 Personal history of diabetic foot ulcer: Secondary | ICD-10-CM | POA: Diagnosis not present

## 2017-12-07 DIAGNOSIS — Z794 Long term (current) use of insulin: Secondary | ICD-10-CM | POA: Diagnosis not present

## 2017-12-07 DIAGNOSIS — E1122 Type 2 diabetes mellitus with diabetic chronic kidney disease: Secondary | ICD-10-CM | POA: Diagnosis not present

## 2017-12-07 DIAGNOSIS — I5032 Chronic diastolic (congestive) heart failure: Secondary | ICD-10-CM | POA: Diagnosis not present

## 2017-12-07 DIAGNOSIS — E1151 Type 2 diabetes mellitus with diabetic peripheral angiopathy without gangrene: Secondary | ICD-10-CM | POA: Diagnosis not present

## 2017-12-07 DIAGNOSIS — N183 Chronic kidney disease, stage 3 (moderate): Secondary | ICD-10-CM | POA: Diagnosis not present

## 2017-12-07 DIAGNOSIS — I130 Hypertensive heart and chronic kidney disease with heart failure and stage 1 through stage 4 chronic kidney disease, or unspecified chronic kidney disease: Secondary | ICD-10-CM | POA: Diagnosis not present

## 2017-12-08 ENCOUNTER — Encounter (HOSPITAL_COMMUNITY)
Admission: RE | Admit: 2017-12-08 | Discharge: 2017-12-08 | Disposition: A | Discharge status: Home / Self Care | Payer: Medicare Other | Source: Ambulatory Visit | Attending: Nephrology | Admitting: Nephrology

## 2017-12-08 VITALS — BP 179/85 | HR 70 | Temp 97.9°F | Resp 18

## 2017-12-08 DIAGNOSIS — N184 Chronic kidney disease, stage 4 (severe): Secondary | ICD-10-CM | POA: Diagnosis not present

## 2017-12-08 DIAGNOSIS — N183 Chronic kidney disease, stage 3 unspecified: Secondary | ICD-10-CM

## 2017-12-08 DIAGNOSIS — D631 Anemia in chronic kidney disease: Secondary | ICD-10-CM | POA: Diagnosis not present

## 2017-12-08 LAB — POCT HEMOGLOBIN-HEMACUE: Hemoglobin: 9.6 g/dL — ABNORMAL LOW (ref 13.0–17.0)

## 2017-12-08 MED ORDER — EPOETIN ALFA-EPBX 10000 UNIT/ML IJ SOLN: 6000.0000 [IU] | Freq: Once | INTRAMUSCULAR | Status: DC

## 2017-12-08 MED ORDER — EPOETIN ALFA-EPBX 10000 UNIT/ML IJ SOLN
10000.0000 [IU] | Freq: Once | INTRAMUSCULAR | Status: AC
  Administered 2017-12-08: 10000 [IU] via SUBCUTANEOUS
  Filled 2017-12-08: qty 1

## 2017-12-08 MED ORDER — EPOETIN ALFA-EPBX 4000 UNIT/ML IJ SOLN: 4000.0000 [IU] | Freq: Once | INTRAMUSCULAR | Status: DC

## 2017-12-08 MED ORDER — EPOETIN ALFA-EPBX 10000 UNIT/ML IJ SOLN: 10000.0000 [IU] | Freq: Once | INTRAMUSCULAR | Status: DC

## 2017-12-08 MED ORDER — EPOETIN ALFA-EPBX 10000 UNIT/ML IJ SOLN: 10000.0000 [IU] | INTRAMUSCULAR | Status: DC

## 2017-12-08 NOTE — Discharge Instructions (Signed)
Epoetin Alfa injection °What is this medicine? °EPOETIN ALFA (e POE e tin AL fa) helps your body make more red blood cells. This medicine is used to treat anemia caused by chronic kidney failure, cancer chemotherapy, or HIV-therapy. It may also be used before surgery if you have anemia. °This medicine may be used for other purposes; ask your health care provider or pharmacist if you have questions. °COMMON BRAND NAME(S): Epogen, Procrit °What should I tell my health care provider before I take this medicine? °They need to know if you have any of these conditions: °-blood clotting disorders °-cancer patient not on chemotherapy °-cystic fibrosis °-heart disease, such as angina or heart failure °-hemoglobin level of 12 g/dL or greater °-high blood pressure °-low levels of folate, iron, or vitamin B12 °-seizures °-an unusual or allergic reaction to erythropoietin, albumin, benzyl alcohol, hamster proteins, other medicines, foods, dyes, or preservatives °-pregnant or trying to get pregnant °-breast-feeding °How should I use this medicine? °This medicine is for injection into a vein or under the skin. It is usually given by a health care professional in a hospital or clinic setting. °If you get this medicine at home, you will be taught how to prepare and give this medicine. Use exactly as directed. Take your medicine at regular intervals. Do not take your medicine more often than directed. °It is important that you put your used needles and syringes in a special sharps container. Do not put them in a trash can. If you do not have a sharps container, call your pharmacist or healthcare provider to get one. °A special MedGuide will be given to you by the pharmacist with each prescription and refill. Be sure to read this information carefully each time. °Talk to your pediatrician regarding the use of this medicine in children. While this drug may be prescribed for selected conditions, precautions do apply. °Overdosage: If you  think you have taken too much of this medicine contact a poison control center or emergency room at once. °NOTE: This medicine is only for you. Do not share this medicine with others. °What if I miss a dose? °If you miss a dose, take it as soon as you can. If it is almost time for your next dose, take only that dose. Do not take double or extra doses. °What may interact with this medicine? °Do not take this medicine with any of the following medications: °-darbepoetin alfa °This list may not describe all possible interactions. Give your health care provider a list of all the medicines, herbs, non-prescription drugs, or dietary supplements you use. Also tell them if you smoke, drink alcohol, or use illegal drugs. Some items may interact with your medicine. °What should I watch for while using this medicine? °Your condition will be monitored carefully while you are receiving this medicine. °You may need blood work done while you are taking this medicine. °What side effects may I notice from receiving this medicine? °Side effects that you should report to your doctor or health care professional as soon as possible: °-allergic reactions like skin rash, itching or hives, swelling of the face, lips, or tongue °-breathing problems °-changes in vision °-chest pain °-confusion, trouble speaking or understanding °-feeling faint or lightheaded, falls °-high blood pressure °-muscle aches or pains °-pain, swelling, warmth in the leg °-rapid weight gain °-severe headaches °-sudden numbness or weakness of the face, arm or leg °-trouble walking, dizziness, loss of balance or coordination °-seizures (convulsions) °-swelling of the ankles, feet, hands °-unusually weak or tired °  Side effects that usually do not require medical attention (report to your doctor or health care professional if they continue or are bothersome): °-diarrhea °-fever, chills (flu-like symptoms) °-headaches °-nausea, vomiting °-redness, stinging, or swelling at  site where injected °This list may not describe all possible side effects. Call your doctor for medical advice about side effects. You may report side effects to FDA at 1-800-FDA-1088. °Where should I keep my medicine? °Keep out of the reach of children. °Store in a refrigerator between 2 and 8 degrees C (36 and 46 degrees F). Do not freeze or shake. Throw away any unused portion if using a single-dose vial. Multi-dose vials can be kept in the refrigerator for up to 21 days after the initial dose. Throw away unused medicine. °NOTE: This sheet is a summary. It may not cover all possible information. If you have questions about this medicine, talk to your doctor, pharmacist, or health care provider. °© 2018 [employer]vier/[employer] Standard (2016-04-05 19:42:31) ° °

## 2017-12-09 DIAGNOSIS — Z794 Long term (current) use of insulin: Secondary | ICD-10-CM | POA: Diagnosis not present

## 2017-12-09 DIAGNOSIS — E785 Hyperlipidemia, unspecified: Secondary | ICD-10-CM | POA: Diagnosis not present

## 2017-12-09 DIAGNOSIS — N183 Chronic kidney disease, stage 3 (moderate): Secondary | ICD-10-CM | POA: Diagnosis not present

## 2017-12-09 DIAGNOSIS — Z89421 Acquired absence of other right toe(s): Secondary | ICD-10-CM | POA: Diagnosis not present

## 2017-12-09 DIAGNOSIS — I5032 Chronic diastolic (congestive) heart failure: Secondary | ICD-10-CM | POA: Diagnosis not present

## 2017-12-09 DIAGNOSIS — E1122 Type 2 diabetes mellitus with diabetic chronic kidney disease: Secondary | ICD-10-CM | POA: Diagnosis not present

## 2017-12-09 DIAGNOSIS — Z8631 Personal history of diabetic foot ulcer: Secondary | ICD-10-CM | POA: Diagnosis not present

## 2017-12-09 DIAGNOSIS — Z89512 Acquired absence of left leg below knee: Secondary | ICD-10-CM | POA: Diagnosis not present

## 2017-12-09 DIAGNOSIS — I130 Hypertensive heart and chronic kidney disease with heart failure and stage 1 through stage 4 chronic kidney disease, or unspecified chronic kidney disease: Secondary | ICD-10-CM | POA: Diagnosis not present

## 2017-12-09 DIAGNOSIS — Z9181 History of falling: Secondary | ICD-10-CM | POA: Diagnosis not present

## 2017-12-09 DIAGNOSIS — E1151 Type 2 diabetes mellitus with diabetic peripheral angiopathy without gangrene: Secondary | ICD-10-CM | POA: Diagnosis not present

## 2017-12-09 DIAGNOSIS — Z4781 Encounter for orthopedic aftercare following surgical amputation: Secondary | ICD-10-CM | POA: Diagnosis not present

## 2017-12-15 ENCOUNTER — Ambulatory Visit (HOSPITAL_COMMUNITY)
Admission: RE | Admit: 2017-12-15 | Discharge: 2017-12-15 | Disposition: A | Discharge status: Home / Self Care | Payer: Medicare Other | Source: Ambulatory Visit | Attending: Nephrology | Admitting: Nephrology

## 2017-12-15 VITALS — BP 155/70 | HR 66 | Temp 98.0°F | Resp 18

## 2017-12-15 DIAGNOSIS — N183 Chronic kidney disease, stage 3 unspecified: Secondary | ICD-10-CM

## 2017-12-15 LAB — POCT HEMOGLOBIN-HEMACUE: Hemoglobin: 9.6 g/dL — ABNORMAL LOW (ref 13.0–17.0)

## 2017-12-15 MED ORDER — EPOETIN ALFA-EPBX 10000 UNIT/ML IJ SOLN
10000.0000 [IU] | INTRAMUSCULAR | Status: DC
  Administered 2017-12-15: 10000 [IU] via SUBCUTANEOUS
  Filled 2017-12-15: qty 1

## 2017-12-22 ENCOUNTER — Encounter: Payer: Self-pay | Admitting: Internal Medicine

## 2017-12-22 ENCOUNTER — Ambulatory Visit: Payer: Medicare Other | Attending: Internal Medicine | Admitting: Internal Medicine

## 2017-12-22 ENCOUNTER — Ambulatory Visit (HOSPITAL_COMMUNITY)
Admission: RE | Admit: 2017-12-22 | Discharge: 2017-12-22 | Disposition: A | Discharge status: Home / Self Care | Payer: Medicare Other | Source: Ambulatory Visit | Attending: Nephrology | Admitting: Nephrology

## 2017-12-22 VITALS — BP 150/83 | HR 59 | Temp 98.1°F | Resp 16 | Wt 234.4 lb

## 2017-12-22 VITALS — BP 159/81 | HR 61 | Temp 98.0°F | Resp 16

## 2017-12-22 DIAGNOSIS — I130 Hypertensive heart and chronic kidney disease with heart failure and stage 1 through stage 4 chronic kidney disease, or unspecified chronic kidney disease: Secondary | ICD-10-CM | POA: Diagnosis not present

## 2017-12-22 DIAGNOSIS — R778 Other specified abnormalities of plasma proteins: Secondary | ICD-10-CM

## 2017-12-22 DIAGNOSIS — N184 Chronic kidney disease, stage 4 (severe): Secondary | ICD-10-CM | POA: Insufficient documentation

## 2017-12-22 DIAGNOSIS — Z794 Long term (current) use of insulin: Secondary | ICD-10-CM | POA: Diagnosis not present

## 2017-12-22 DIAGNOSIS — I10 Essential (primary) hypertension: Secondary | ICD-10-CM

## 2017-12-22 DIAGNOSIS — Z7982 Long term (current) use of aspirin: Secondary | ICD-10-CM | POA: Insufficient documentation

## 2017-12-22 DIAGNOSIS — N183 Chronic kidney disease, stage 3 unspecified: Secondary | ICD-10-CM

## 2017-12-22 DIAGNOSIS — E1122 Type 2 diabetes mellitus with diabetic chronic kidney disease: Secondary | ICD-10-CM | POA: Diagnosis not present

## 2017-12-22 DIAGNOSIS — E11621 Type 2 diabetes mellitus with foot ulcer: Secondary | ICD-10-CM | POA: Diagnosis not present

## 2017-12-22 DIAGNOSIS — D631 Anemia in chronic kidney disease: Secondary | ICD-10-CM | POA: Diagnosis not present

## 2017-12-22 DIAGNOSIS — Z79899 Other long term (current) drug therapy: Secondary | ICD-10-CM | POA: Insufficient documentation

## 2017-12-22 DIAGNOSIS — E118 Type 2 diabetes mellitus with unspecified complications: Secondary | ICD-10-CM

## 2017-12-22 DIAGNOSIS — I5032 Chronic diastolic (congestive) heart failure: Secondary | ICD-10-CM | POA: Insufficient documentation

## 2017-12-22 DIAGNOSIS — Z89512 Acquired absence of left leg below knee: Secondary | ICD-10-CM | POA: Diagnosis not present

## 2017-12-22 DIAGNOSIS — Z87891 Personal history of nicotine dependence: Secondary | ICD-10-CM | POA: Insufficient documentation

## 2017-12-22 DIAGNOSIS — M7989 Other specified soft tissue disorders: Secondary | ICD-10-CM | POA: Diagnosis not present

## 2017-12-22 LAB — POCT HEMOGLOBIN-HEMACUE: Hemoglobin: 9.5 g/dL — ABNORMAL LOW (ref 13.0–17.0)

## 2017-12-22 LAB — GLUCOSE, POCT (MANUAL RESULT ENTRY): POC Glucose: 150 mg/dl — AB (ref 70–99)

## 2017-12-22 LAB — POCT GLYCOSYLATED HEMOGLOBIN (HGB A1C): Hemoglobin A1C: 6.2

## 2017-12-22 MED ORDER — EPOETIN ALFA-EPBX 10000 UNIT/ML IJ SOLN
10000.0000 [IU] | INTRAMUSCULAR | Status: DC
  Administered 2017-12-22: 10000 [IU] via SUBCUTANEOUS
  Filled 2017-12-22: qty 1

## 2017-12-22 MED ORDER — HYDRALAZINE HCL 10 MG PO TABS: 10.0000 mg | ORAL_TABLET | Freq: Two times a day (BID) | ORAL | 5 refills | Status: DC

## 2017-12-22 NOTE — Patient Instructions (Signed)
Your blood pressure is not well controlled.  We have added a medication called hydralazine 10 mg twice a day.
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
## 2017-12-23 ENCOUNTER — Ambulatory Visit (INDEPENDENT_AMBULATORY_CARE_PROVIDER_SITE_OTHER): Payer: Medicare Other | Admitting: Orthopaedic Surgery

## 2017-12-27 ENCOUNTER — Other Ambulatory Visit: Payer: Self-pay | Admitting: Internal Medicine

## 2017-12-27 DIAGNOSIS — I10 Essential (primary) hypertension: Secondary | ICD-10-CM

## 2017-12-29 ENCOUNTER — Encounter (HOSPITAL_COMMUNITY): Payer: Medicare Other

## 2017-12-29 ENCOUNTER — Ambulatory Visit (HOSPITAL_COMMUNITY)
Admission: RE | Admit: 2017-12-29 | Discharge: 2017-12-29 | Disposition: A | Discharge status: Home / Self Care | Payer: Medicare Other | Source: Ambulatory Visit | Attending: Nephrology | Admitting: Nephrology

## 2017-12-29 VITALS — BP 148/89 | HR 72 | Temp 99.1°F | Resp 18

## 2017-12-29 DIAGNOSIS — N183 Chronic kidney disease, stage 3 unspecified: Secondary | ICD-10-CM

## 2017-12-29 LAB — POCT HEMOGLOBIN-HEMACUE: Hemoglobin: 11.1 g/dL — ABNORMAL LOW (ref 13.0–17.0)

## 2017-12-29 MED ORDER — EPOETIN ALFA-EPBX 10000 UNIT/ML IJ SOLN
10000.0000 [IU] | INTRAMUSCULAR | Status: DC
Start: 2017-12-29 — End: 2017-12-30
  Administered 2017-12-29: 10000 [IU] via SUBCUTANEOUS
  Filled 2017-12-29: qty 1

## 2018-01-03 ENCOUNTER — Encounter (INDEPENDENT_AMBULATORY_CARE_PROVIDER_SITE_OTHER): Payer: Self-pay | Admitting: Orthopaedic Surgery

## 2018-01-03 ENCOUNTER — Ambulatory Visit (INDEPENDENT_AMBULATORY_CARE_PROVIDER_SITE_OTHER): Payer: Medicare Other | Admitting: Orthopaedic Surgery

## 2018-01-03 VITALS — BP 146/83 | HR 67 | Temp 98.7°F | Ht 74.0 in | Wt 236.0 lb

## 2018-01-03 DIAGNOSIS — Z89431 Acquired absence of right foot: Secondary | ICD-10-CM
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
## 2018-01-04 ENCOUNTER — Ambulatory Visit (INDEPENDENT_AMBULATORY_CARE_PROVIDER_SITE_OTHER): Payer: Medicare Other | Admitting: Orthopaedic Surgery

## 2018-01-05 ENCOUNTER — Ambulatory Visit (HOSPITAL_COMMUNITY)
Admission: RE | Admit: 2018-01-05 | Discharge: 2018-01-05 | Disposition: A | Discharge status: Home / Self Care | Payer: Medicare Other | Source: Ambulatory Visit | Attending: Nephrology | Admitting: Nephrology

## 2018-01-05 VITALS — BP 160/83 | HR 79 | Temp 98.3°F | Resp 18

## 2018-01-05 DIAGNOSIS — N183 Chronic kidney disease, stage 3 unspecified: Secondary | ICD-10-CM

## 2018-01-05 DIAGNOSIS — N184 Chronic kidney disease, stage 4 (severe): Secondary | ICD-10-CM | POA: Diagnosis not present

## 2018-01-05 DIAGNOSIS — M7541 Impingement syndrome of right shoulder: Secondary | ICD-10-CM | POA: Diagnosis not present

## 2018-01-05 DIAGNOSIS — S88119A Complete traumatic amputation at level between knee and ankle, unspecified lower leg, initial encounter: Secondary | ICD-10-CM | POA: Diagnosis not present

## 2018-01-05 DIAGNOSIS — Z79899 Other long term (current) drug therapy: Secondary | ICD-10-CM | POA: Diagnosis not present

## 2018-01-05 DIAGNOSIS — M7542 Impingement syndrome of left shoulder: Secondary | ICD-10-CM | POA: Diagnosis not present

## 2018-01-05 DIAGNOSIS — I5032 Chronic diastolic (congestive) heart failure: Secondary | ICD-10-CM | POA: Diagnosis not present

## 2018-01-05 DIAGNOSIS — D631 Anemia in chronic kidney disease: Secondary | ICD-10-CM | POA: Diagnosis not present

## 2018-01-05 DIAGNOSIS — Z5181 Encounter for therapeutic drug level monitoring: Secondary | ICD-10-CM | POA: Insufficient documentation

## 2018-01-05 LAB — POCT HEMOGLOBIN-HEMACUE: Hemoglobin: 11.3 g/dL — ABNORMAL LOW (ref 13.0–17.0)

## 2018-01-05 MED ORDER — EPOETIN ALFA-EPBX 10000 UNIT/ML IJ SOLN
10000.0000 [IU] | INTRAMUSCULAR | Status: DC
Start: 2018-01-05 — End: 2018-01-06
  Administered 2018-01-05: 10000 [IU] via SUBCUTANEOUS
  Filled 2018-01-05 (×2): qty 1

## 2018-01-05 MED ORDER — EPOETIN ALFA 10000 UNIT/ML IJ SOLN
INTRAMUSCULAR | Status: AC
  Filled 2018-01-05: qty 1

## 2018-01-10 ENCOUNTER — Other Ambulatory Visit: Payer: Self-pay | Admitting: Internal Medicine

## 2018-01-10 DIAGNOSIS — I10 Essential (primary) hypertension: Secondary | ICD-10-CM

## 2018-01-11 ENCOUNTER — Encounter (HOSPITAL_COMMUNITY): Payer: Medicare Other

## 2018-01-12 ENCOUNTER — Encounter (HOSPITAL_COMMUNITY): Payer: Medicare Other

## 2018-01-12 ENCOUNTER — Other Ambulatory Visit: Payer: Self-pay

## 2018-01-12 ENCOUNTER — Encounter (INDEPENDENT_AMBULATORY_CARE_PROVIDER_SITE_OTHER): Payer: Self-pay | Admitting: Surgery

## 2018-01-12 ENCOUNTER — Ambulatory Visit (INDEPENDENT_AMBULATORY_CARE_PROVIDER_SITE_OTHER): Payer: Medicare Other | Admitting: Surgery

## 2018-01-12 ENCOUNTER — Ambulatory Visit (HOSPITAL_COMMUNITY)
Admission: RE | Admit: 2018-01-12 | Discharge: 2018-01-12 | Disposition: A | Discharge status: Home / Self Care | Payer: Medicare Other | Source: Ambulatory Visit | Attending: Nephrology | Admitting: Nephrology

## 2018-01-12 ENCOUNTER — Ambulatory Visit (INDEPENDENT_AMBULATORY_CARE_PROVIDER_SITE_OTHER): Payer: Medicare Other

## 2018-01-12 VITALS — BP 162/86 | HR 68

## 2018-01-12 VITALS — BP 132/68 | HR 70 | Ht 74.0 in | Wt 237.0 lb

## 2018-01-12 DIAGNOSIS — S62101A Fracture of unspecified carpal bone, right wrist, initial encounter for closed fracture: Secondary | ICD-10-CM | POA: Diagnosis not present

## 2018-01-12 DIAGNOSIS — M25531 Pain in right wrist: Secondary | ICD-10-CM | POA: Diagnosis not present

## 2018-01-12 DIAGNOSIS — I10 Essential (primary) hypertension: Secondary | ICD-10-CM

## 2018-01-12 DIAGNOSIS — N183 Chronic kidney disease, stage 3 unspecified: Secondary | ICD-10-CM

## 2018-01-12 LAB — POCT HEMOGLOBIN-HEMACUE: Hemoglobin: 11.9 g/dL — ABNORMAL LOW (ref 13.0–17.0)

## 2018-01-12 MED ORDER — EPOETIN ALFA-EPBX 10000 UNIT/ML IJ SOLN
10000.0000 [IU] | INTRAMUSCULAR | Status: DC
  Administered 2018-01-12: 10000 [IU] via SUBCUTANEOUS
  Filled 2018-01-12 (×2): qty 1

## 2018-01-12 MED ORDER — FUROSEMIDE 20 MG PO TABS: 20.0000 mg | ORAL_TABLET | Freq: Every day | ORAL | 0 refills | Status: DC

## 2018-01-19 ENCOUNTER — Ambulatory Visit (INDEPENDENT_AMBULATORY_CARE_PROVIDER_SITE_OTHER): Payer: Self-pay

## 2018-01-19 ENCOUNTER — Ambulatory Visit (INDEPENDENT_AMBULATORY_CARE_PROVIDER_SITE_OTHER): Payer: Medicare Other | Admitting: Orthopaedic Surgery

## 2018-01-19 ENCOUNTER — Telehealth (INDEPENDENT_AMBULATORY_CARE_PROVIDER_SITE_OTHER): Payer: Self-pay | Admitting: Orthopaedic Surgery

## 2018-01-19 ENCOUNTER — Ambulatory Visit (HOSPITAL_COMMUNITY)
Admission: RE | Admit: 2018-01-19 | Discharge: 2018-01-19 | Disposition: A | Discharge status: Home / Self Care | Payer: Medicare Other | Source: Ambulatory Visit | Attending: Nephrology | Admitting: Nephrology

## 2018-01-19 ENCOUNTER — Encounter (INDEPENDENT_AMBULATORY_CARE_PROVIDER_SITE_OTHER): Payer: Self-pay | Admitting: Orthopaedic Surgery

## 2018-01-19 VITALS — BP 157/85 | HR 66 | Temp 98.6°F | Resp 18

## 2018-01-19 DIAGNOSIS — S52501S Unspecified fracture of the lower end of right radius, sequela: Secondary | ICD-10-CM | POA: Diagnosis not present

## 2018-01-19 DIAGNOSIS — N183 Chronic kidney disease, stage 3 unspecified: Secondary | ICD-10-CM

## 2018-01-19 DIAGNOSIS — M25531 Pain in right wrist: Secondary | ICD-10-CM

## 2018-01-19 LAB — POCT HEMOGLOBIN-HEMACUE: Hemoglobin: 11.7 g/dL — ABNORMAL LOW (ref 13.0–17.0)

## 2018-01-19 MED ORDER — HYDROCODONE-ACETAMINOPHEN 5-325 MG PO TABS: 1.0000 | ORAL_TABLET | Freq: Four times a day (QID) | ORAL | 0 refills | Status: DC | PRN

## 2018-01-19 MED ORDER — EPOETIN ALFA-EPBX 10000 UNIT/ML IJ SOLN
10000.0000 [IU] | INTRAMUSCULAR | Status: DC
  Administered 2018-01-19: 10000 [IU] via SUBCUTANEOUS
  Filled 2018-01-19: qty 1

## 2018-01-19 NOTE — Telephone Encounter (Signed)
Pt called couldn't get med due to [facility] stating they wouldn't mail it. Pt is unable to make it to pharmacy

## 2018-01-19 NOTE — Progress Notes (Signed)
The patient is following up a week after having a mechanical fall injuring his right dominant wrist.  He was seen at the office here by Mack Guise, PA-C.  He was found to have a distal radius fracture and placed appropriately in a sugar tong splint.  He is following up today to see how he is doing overall and to see if we can put him in a cast or not.  Ambulate with a cane.  He is hoping not to have any type of surgery.  He is 67 years old.  He does report pain in the wrist and swelling in his fingers and hand.  On exam clinically his wrist appears straight.  His hand is swollen and his fingers are swollen but his hand is well-perfused and is neurovascularly intact.  2 views of his right wrist show an extra articular distal radius fracture.  There is only slight shortening.  His volar tilt is well-maintained.  This is a wrist that we can try to treat nonoperatively since the alignment overall is pretty good.  We will put him in a short arm cast today.  We will have him in his cast with next 4 weeks.  In 4 weeks and I will remove the cast and get a repeat 2 views of his right wrist.  All questions concerns were answered and addressed.  I did give him a prescription for hydrocodone.

## 2018-01-20 NOTE — Telephone Encounter (Signed)
I;m not sure what he want's [place] to do instead? Did he mention?

## 2018-01-20 NOTE — Telephone Encounter (Signed)
Can you escribe the pain medication to his "[facility]" in his chart? He can't drive to get it and this is the only way they will deliver it to him 'just remember to choose [facility] before sending Rx  all the way through

## 2018-01-20 NOTE — Telephone Encounter (Signed)
Tried calling patient and advising him of Dr. Trevor Mace message,but no answer and VM was not set-up to leave a message.

## 2018-01-20 NOTE — Telephone Encounter (Signed)
I'm not sure myself

## 2018-01-20 NOTE — Telephone Encounter (Signed)
I actually printed out a prescription for hydrocodone for him and gave it to him.  There is no narcotic that can be sent to [facility]; but still, I did give him a pain med script

## 2018-01-25 ENCOUNTER — Other Ambulatory Visit: Payer: Self-pay

## 2018-01-25 DIAGNOSIS — E1100 Type 2 diabetes mellitus with hyperosmolarity without nonketotic hyperglycemic-hyperosmolar coma (NKHHC): Secondary | ICD-10-CM

## 2018-01-25 MED ORDER — ACCU-CHEK SOFTCLIX LANCETS MISC: 12 refills | Status: DC

## 2018-01-25 MED ORDER — GLUCOSE BLOOD VI STRP: 1.0000 | ORAL_STRIP | Freq: Three times a day (TID) | 12 refills | Status: DC

## 2018-01-26 ENCOUNTER — Ambulatory Visit (HOSPITAL_COMMUNITY)
Admission: RE | Admit: 2018-01-26 | Discharge: 2018-01-26 | Disposition: A | Discharge status: Home / Self Care | Payer: Medicare Other | Source: Ambulatory Visit | Attending: Nephrology | Admitting: Nephrology

## 2018-01-26 VITALS — BP 137/75 | HR 63 | Resp 18

## 2018-01-26 DIAGNOSIS — D631 Anemia in chronic kidney disease: Secondary | ICD-10-CM | POA: Insufficient documentation

## 2018-01-26 DIAGNOSIS — Z79899 Other long term (current) drug therapy: Secondary | ICD-10-CM | POA: Insufficient documentation

## 2018-01-26 DIAGNOSIS — N183 Chronic kidney disease, stage 3 unspecified: Secondary | ICD-10-CM

## 2018-01-26 DIAGNOSIS — N184 Chronic kidney disease, stage 4 (severe): Secondary | ICD-10-CM | POA: Diagnosis not present

## 2018-01-26 DIAGNOSIS — Z5181 Encounter for therapeutic drug level monitoring: Secondary | ICD-10-CM | POA: Diagnosis not present

## 2018-01-26 MED ORDER — EPOETIN ALFA-EPBX 10000 UNIT/ML IJ SOLN
10000.0000 [IU] | INTRAMUSCULAR | Status: DC
  Administered 2018-01-26: 10000 [IU] via SUBCUTANEOUS
  Filled 2018-01-26: qty 1

## 2018-01-27 LAB — POCT HEMOGLOBIN-HEMACUE: Hemoglobin: 11.8 g/dL — ABNORMAL LOW (ref 13.0–17.0)

## 2018-01-27 MED FILL — ACCU-CHEK SOFTCLIX LANCETS: 30 days supply | Qty: 100 | Fill #1

## 2018-01-27 MED FILL — ACCU-CHEK AVIVA PLUS TEST S: 30 days supply | Qty: 100 | Fill #4

## 2018-02-01 DIAGNOSIS — N2581 Secondary hyperparathyroidism of renal origin: Secondary | ICD-10-CM | POA: Diagnosis not present

## 2018-02-01 DIAGNOSIS — I129 Hypertensive chronic kidney disease with stage 1 through stage 4 chronic kidney disease, or unspecified chronic kidney disease: Secondary | ICD-10-CM | POA: Diagnosis not present

## 2018-02-01 DIAGNOSIS — E1129 Type 2 diabetes mellitus with other diabetic kidney complication: Secondary | ICD-10-CM | POA: Diagnosis not present

## 2018-02-01 DIAGNOSIS — N179 Acute kidney failure, unspecified: Secondary | ICD-10-CM | POA: Diagnosis not present

## 2018-02-01 DIAGNOSIS — R809 Proteinuria, unspecified: Secondary | ICD-10-CM | POA: Diagnosis not present

## 2018-02-01 DIAGNOSIS — E1122 Type 2 diabetes mellitus with diabetic chronic kidney disease: Secondary | ICD-10-CM | POA: Diagnosis not present

## 2018-02-01 DIAGNOSIS — D631 Anemia in chronic kidney disease: Secondary | ICD-10-CM | POA: Diagnosis not present

## 2018-02-01 DIAGNOSIS — N184 Chronic kidney disease, stage 4 (severe): Secondary | ICD-10-CM | POA: Diagnosis not present

## 2018-02-02 ENCOUNTER — Ambulatory Visit (HOSPITAL_COMMUNITY)
Admission: RE | Admit: 2018-02-02 | Discharge: 2018-02-02 | Disposition: A | Discharge status: Home / Self Care | Payer: Medicare Other | Source: Ambulatory Visit | Attending: Nephrology | Admitting: Nephrology

## 2018-02-02 VITALS — BP 142/72 | HR 69 | Temp 98.5°F | Resp 18

## 2018-02-02 DIAGNOSIS — N183 Chronic kidney disease, stage 3 unspecified: Secondary | ICD-10-CM

## 2018-02-02 DIAGNOSIS — N184 Chronic kidney disease, stage 4 (severe): Secondary | ICD-10-CM | POA: Diagnosis not present

## 2018-02-02 DIAGNOSIS — D631 Anemia in chronic kidney disease: Secondary | ICD-10-CM | POA: Diagnosis not present

## 2018-02-02 LAB — POCT HEMOGLOBIN-HEMACUE: Hemoglobin: 12.3 g/dL — ABNORMAL LOW (ref 13.0–17.0)

## 2018-02-02 MED ORDER — EPOETIN ALFA-EPBX 10000 UNIT/ML IJ SOLN: 10000.0000 [IU] | INTRAMUSCULAR | Status: DC

## 2018-02-05 DIAGNOSIS — I5032 Chronic diastolic (congestive) heart failure: Secondary | ICD-10-CM | POA: Diagnosis not present

## 2018-02-05 DIAGNOSIS — S88119A Complete traumatic amputation at level between knee and ankle, unspecified lower leg, initial encounter: Secondary | ICD-10-CM | POA: Diagnosis not present

## 2018-02-05 DIAGNOSIS — M7541 Impingement syndrome of right shoulder: Secondary | ICD-10-CM | POA: Diagnosis not present

## 2018-02-05 DIAGNOSIS — M7542 Impingement syndrome of left shoulder: Secondary | ICD-10-CM | POA: Diagnosis not present

## 2018-02-05 DIAGNOSIS — N184 Chronic kidney disease, stage 4 (severe): Secondary | ICD-10-CM | POA: Diagnosis not present

## 2018-02-09 ENCOUNTER — Encounter (HOSPITAL_COMMUNITY): Payer: Medicare Other

## 2018-02-16 ENCOUNTER — Ambulatory Visit (INDEPENDENT_AMBULATORY_CARE_PROVIDER_SITE_OTHER): Payer: Medicare Other | Admitting: Orthopaedic Surgery

## 2018-02-16 ENCOUNTER — Ambulatory Visit (INDEPENDENT_AMBULATORY_CARE_PROVIDER_SITE_OTHER): Payer: Medicare Other

## 2018-02-16 ENCOUNTER — Telehealth: Payer: Self-pay | Admitting: Neurology

## 2018-02-16 ENCOUNTER — Encounter (INDEPENDENT_AMBULATORY_CARE_PROVIDER_SITE_OTHER): Payer: Self-pay | Admitting: Orthopaedic Surgery

## 2018-02-16 ENCOUNTER — Other Ambulatory Visit: Payer: Self-pay | Admitting: Internal Medicine

## 2018-02-16 ENCOUNTER — Encounter (HOSPITAL_COMMUNITY): Payer: Medicare Other

## 2018-02-16 DIAGNOSIS — M25531 Pain in right wrist: Secondary | ICD-10-CM

## 2018-02-16 DIAGNOSIS — I5042 Chronic combined systolic (congestive) and diastolic (congestive) heart failure: Secondary | ICD-10-CM

## 2018-02-16 DIAGNOSIS — S52501S Unspecified fracture of the lower end of right radius, sequela: Secondary | ICD-10-CM

## 2018-02-16 DIAGNOSIS — I10 Essential (primary) hypertension: Secondary | ICD-10-CM

## 2018-02-16 DIAGNOSIS — R569 Unspecified convulsions: Secondary | ICD-10-CM

## 2018-02-16 MED ORDER — LACOSAMIDE 200 MG PO TABS: 200.0000 mg | ORAL_TABLET | Freq: Two times a day (BID) | ORAL | 1 refills | Status: DC

## 2018-02-16 NOTE — Progress Notes (Signed)
The patient is now about 6 weeks status post a right distal radius fracture.  This appeared to be extra articular fracture and his first x-ray showed he was in neutral alignment.  We placed him in a cast.  His next x-rays in the cast also showed neutral alignment.  He is here today to have the cast removed and try splinting with a Velcro splint at this point.  He reports only mild right wrist pain.  On examination of the cast he has swelling to expect around his right wrist.  His ulna is prominent more on the right injured side than the left.  Actually has pretty good flexion and extension of the wrist itself.  His pronation and supination are limited.  The repeat x-rays of the right wrist are obtained and compared to previous films.  He is definitely lost his reduction.  He is in more volar alignment and shortening of the fracture as well which is likely been a cause ulnar impingement.  I went over the x-rays with him.  I do feel he would likely need surgery on the wrist.  He wants to see how he does with time which is reasonable at this point of time.  We will put him in a Velcro wrist splint today and I like to see him back in 3 weeks with a repeat AP and lateral of the right wrist.

## 2018-02-16 NOTE — Telephone Encounter (Signed)
The prescription for Vimpat was sent in.

## 2018-02-22 ENCOUNTER — Encounter: Payer: Self-pay | Admitting: Internal Medicine

## 2018-02-22 NOTE — Progress Notes (Signed)
Note from neph Dr. Marval Regal. Add Hydralazine 10 mg 2 tabs BID A1C 6.4 H/H 12.1/36.9 Ferritin 65/15%/iron 28

## 2018-02-28 ENCOUNTER — Ambulatory Visit (HOSPITAL_COMMUNITY)
Admission: RE | Admit: 2018-02-28 | Discharge: 2018-02-28 | Disposition: A | Discharge status: Home / Self Care | Payer: Medicare Other | Source: Ambulatory Visit | Attending: Nephrology | Admitting: Nephrology

## 2018-02-28 VITALS — BP 148/87 | HR 74 | Temp 98.7°F | Resp 18

## 2018-02-28 DIAGNOSIS — N183 Chronic kidney disease, stage 3 unspecified: Secondary | ICD-10-CM

## 2018-02-28 DIAGNOSIS — Z79899 Other long term (current) drug therapy: Secondary | ICD-10-CM | POA: Diagnosis not present

## 2018-02-28 DIAGNOSIS — D631 Anemia in chronic kidney disease: Secondary | ICD-10-CM | POA: Diagnosis not present

## 2018-02-28 DIAGNOSIS — N184 Chronic kidney disease, stage 4 (severe): Secondary | ICD-10-CM | POA: Diagnosis not present

## 2018-02-28 DIAGNOSIS — Z5181 Encounter for therapeutic drug level monitoring: Secondary | ICD-10-CM | POA: Diagnosis not present

## 2018-02-28 LAB — COMPREHENSIVE METABOLIC PANEL
ALT: 15 U/L (ref 0–44)
AST: 11 U/L — ABNORMAL LOW (ref 15–41)
Albumin: 3.4 g/dL — ABNORMAL LOW (ref 3.5–5.0)
Alkaline Phosphatase: 76 U/L (ref 38–126)
Anion gap: 8 (ref 5–15)
BUN: 49 mg/dL — ABNORMAL HIGH (ref 8–23)
CO2: 18 mmol/L — ABNORMAL LOW (ref 22–32)
Calcium: 9.1 mg/dL (ref 8.9–10.3)
Chloride: 118 mmol/L — ABNORMAL HIGH (ref 98–111)
Creatinine, Ser: 3.62 mg/dL — ABNORMAL HIGH (ref 0.61–1.24)
GFR calc Af Amer: 19 mL/min — ABNORMAL LOW (ref >60)
GFR calc non Af Amer: 16 mL/min — ABNORMAL LOW (ref >60)
Glucose, Bld: 106 mg/dL — ABNORMAL HIGH (ref 70–99)
Potassium: 4.8 mmol/L (ref 3.5–5.1)
Sodium: 144 mmol/L (ref 135–145)
Total Bilirubin: 0.6 mg/dL (ref 0.3–1.2)
Total Protein: 7.9 g/dL (ref 6.5–8.1)

## 2018-02-28 LAB — IRON AND TIBC
Iron: 61 ug/dL (ref 45–182)
Saturation Ratios: 29 % (ref 17.9–39.5)
TIBC: 210 ug/dL — ABNORMAL LOW (ref 250–450)
UIBC: 149 ug/dL

## 2018-02-28 LAB — POCT HEMOGLOBIN-HEMACUE: Hemoglobin: 12.2 g/dL — ABNORMAL LOW (ref 13.0–17.0)

## 2018-02-28 LAB — PHOSPHORUS: Phosphorus: 4.2 mg/dL (ref 2.5–4.6)

## 2018-02-28 LAB — FERRITIN: Ferritin: 98 ng/mL (ref 24–336)

## 2018-02-28 MED ORDER — EPOETIN ALFA-EPBX 10000 UNIT/ML IJ SOLN: 5000.0000 [IU] | INTRAMUSCULAR | Status: DC

## 2018-02-28 MED ORDER — EPOETIN ALFA-EPBX 10000 UNIT/ML IJ SOLN: 5000.0000 [IU] | Freq: Once | INTRAMUSCULAR | Status: DC

## 2018-03-07 DIAGNOSIS — I5032 Chronic diastolic (congestive) heart failure: Secondary | ICD-10-CM | POA: Diagnosis not present

## 2018-03-07 DIAGNOSIS — N184 Chronic kidney disease, stage 4 (severe): Secondary | ICD-10-CM | POA: Diagnosis not present

## 2018-03-07 DIAGNOSIS — M7541 Impingement syndrome of right shoulder: Secondary | ICD-10-CM | POA: Diagnosis not present

## 2018-03-07 DIAGNOSIS — S88119A Complete traumatic amputation at level between knee and ankle, unspecified lower leg, initial encounter: Secondary | ICD-10-CM | POA: Diagnosis not present

## 2018-03-07 DIAGNOSIS — M7542 Impingement syndrome of left shoulder: Secondary | ICD-10-CM | POA: Diagnosis not present

## 2018-03-09 ENCOUNTER — Telehealth (INDEPENDENT_AMBULATORY_CARE_PROVIDER_SITE_OTHER): Payer: Self-pay | Admitting: Orthopaedic Surgery

## 2018-03-09 NOTE — Telephone Encounter (Signed)
Last 2 ov notes from Dr. Lorin Mercy faxed to [facility] [phone]

## 2018-03-13 ENCOUNTER — Ambulatory Visit: Payer: Medicare Other | Attending: Internal Medicine | Admitting: Internal Medicine

## 2018-03-13 ENCOUNTER — Encounter: Payer: Self-pay | Admitting: Internal Medicine

## 2018-03-13 VITALS — BP 146/76 | HR 67 | Temp 98.0°F | Resp 16 | Wt 225.6 lb

## 2018-03-13 DIAGNOSIS — E11621 Type 2 diabetes mellitus with foot ulcer: Secondary | ICD-10-CM | POA: Diagnosis not present

## 2018-03-13 DIAGNOSIS — Z794 Long term (current) use of insulin: Secondary | ICD-10-CM | POA: Insufficient documentation

## 2018-03-13 DIAGNOSIS — Z79899 Other long term (current) drug therapy: Secondary | ICD-10-CM | POA: Insufficient documentation

## 2018-03-13 DIAGNOSIS — W010XXA Fall on same level from slipping, tripping and stumbling without subsequent striking against object, initial encounter: Secondary | ICD-10-CM | POA: Insufficient documentation

## 2018-03-13 DIAGNOSIS — Y9301 Activity, walking, marching and hiking: Secondary | ICD-10-CM | POA: Diagnosis not present

## 2018-03-13 DIAGNOSIS — S62101A Fracture of unspecified carpal bone, right wrist, initial encounter for closed fracture: Secondary | ICD-10-CM | POA: Insufficient documentation

## 2018-03-13 DIAGNOSIS — R778 Other specified abnormalities of plasma proteins: Secondary | ICD-10-CM

## 2018-03-13 DIAGNOSIS — Z9181 History of falling: Secondary | ICD-10-CM | POA: Diagnosis not present

## 2018-03-13 DIAGNOSIS — K219 Gastro-esophageal reflux disease without esophagitis: Secondary | ICD-10-CM | POA: Diagnosis not present

## 2018-03-13 DIAGNOSIS — I10 Essential (primary) hypertension: Secondary | ICD-10-CM

## 2018-03-13 DIAGNOSIS — N183 Chronic kidney disease, stage 3 (moderate): Secondary | ICD-10-CM | POA: Diagnosis not present

## 2018-03-13 DIAGNOSIS — L602 Onychogryphosis: Secondary | ICD-10-CM

## 2018-03-13 DIAGNOSIS — D509 Iron deficiency anemia, unspecified: Secondary | ICD-10-CM | POA: Insufficient documentation

## 2018-03-13 DIAGNOSIS — N184 Chronic kidney disease, stage 4 (severe): Secondary | ICD-10-CM | POA: Diagnosis not present

## 2018-03-13 DIAGNOSIS — N179 Acute kidney failure, unspecified: Secondary | ICD-10-CM | POA: Diagnosis not present

## 2018-03-13 DIAGNOSIS — Z7982 Long term (current) use of aspirin: Secondary | ICD-10-CM | POA: Diagnosis not present

## 2018-03-13 DIAGNOSIS — I5032 Chronic diastolic (congestive) heart failure: Secondary | ICD-10-CM | POA: Diagnosis not present

## 2018-03-13 DIAGNOSIS — E118 Type 2 diabetes mellitus with unspecified complications: Secondary | ICD-10-CM | POA: Diagnosis not present

## 2018-03-13 DIAGNOSIS — G40909 Epilepsy, unspecified, not intractable, without status epilepticus: Secondary | ICD-10-CM | POA: Insufficient documentation

## 2018-03-13 DIAGNOSIS — E1122 Type 2 diabetes mellitus with diabetic chronic kidney disease: Secondary | ICD-10-CM | POA: Diagnosis not present

## 2018-03-13 DIAGNOSIS — I130 Hypertensive heart and chronic kidney disease with heart failure and stage 1 through stage 4 chronic kidney disease, or unspecified chronic kidney disease: Secondary | ICD-10-CM | POA: Diagnosis not present

## 2018-03-13 DIAGNOSIS — E119 Type 2 diabetes mellitus without complications: Secondary | ICD-10-CM | POA: Diagnosis present

## 2018-03-13 DIAGNOSIS — Z89512 Acquired absence of left leg below knee: Secondary | ICD-10-CM | POA: Insufficient documentation

## 2018-03-13 LAB — GLUCOSE, POCT (MANUAL RESULT ENTRY)
POC Glucose: 71 mg/dl (ref 70–99)
POC Glucose: 97 mg/dl (ref 70–99)

## 2018-03-13 MED ORDER — HYDRALAZINE HCL 10 MG PO TABS: 20.0000 mg | ORAL_TABLET | Freq: Two times a day (BID) | ORAL | 5 refills | Status: DC
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
## 2018-03-16 ENCOUNTER — Encounter: Payer: Self-pay | Admitting: Hematology

## 2018-03-16 ENCOUNTER — Telehealth: Payer: Self-pay | Admitting: Hematology

## 2018-03-16 NOTE — Telephone Encounter (Signed)
New referral received from Dr. Wynetta Emery at [facility] for dx of abnormal SPEP. Pt has been scheduled for the pt to see Dr. Burr Medico on 8/6 at 2pm. Pt aware to arrive 30 minutes early. Letter mailed.

## 2018-03-17 ENCOUNTER — Telehealth: Payer: Self-pay

## 2018-03-17 NOTE — Telephone Encounter (Signed)
Pt declined AWV, stating he is not able to travel, I informed him he is welcome to call any time to schedule a visit when/if he would like.

## 2018-03-20 ENCOUNTER — Ambulatory Visit (INDEPENDENT_AMBULATORY_CARE_PROVIDER_SITE_OTHER): Payer: Medicare Other | Admitting: Orthopaedic Surgery

## 2018-03-21 IMAGING — CR DG CHEST 1V PORT
1 series · 1 of 1 positions shown · non-contrast
Comparison: 01/01/2016

CLINICAL DATA: Difficulty breathing

EXAM:
PORTABLE CHEST 1 VIEW

[AP]
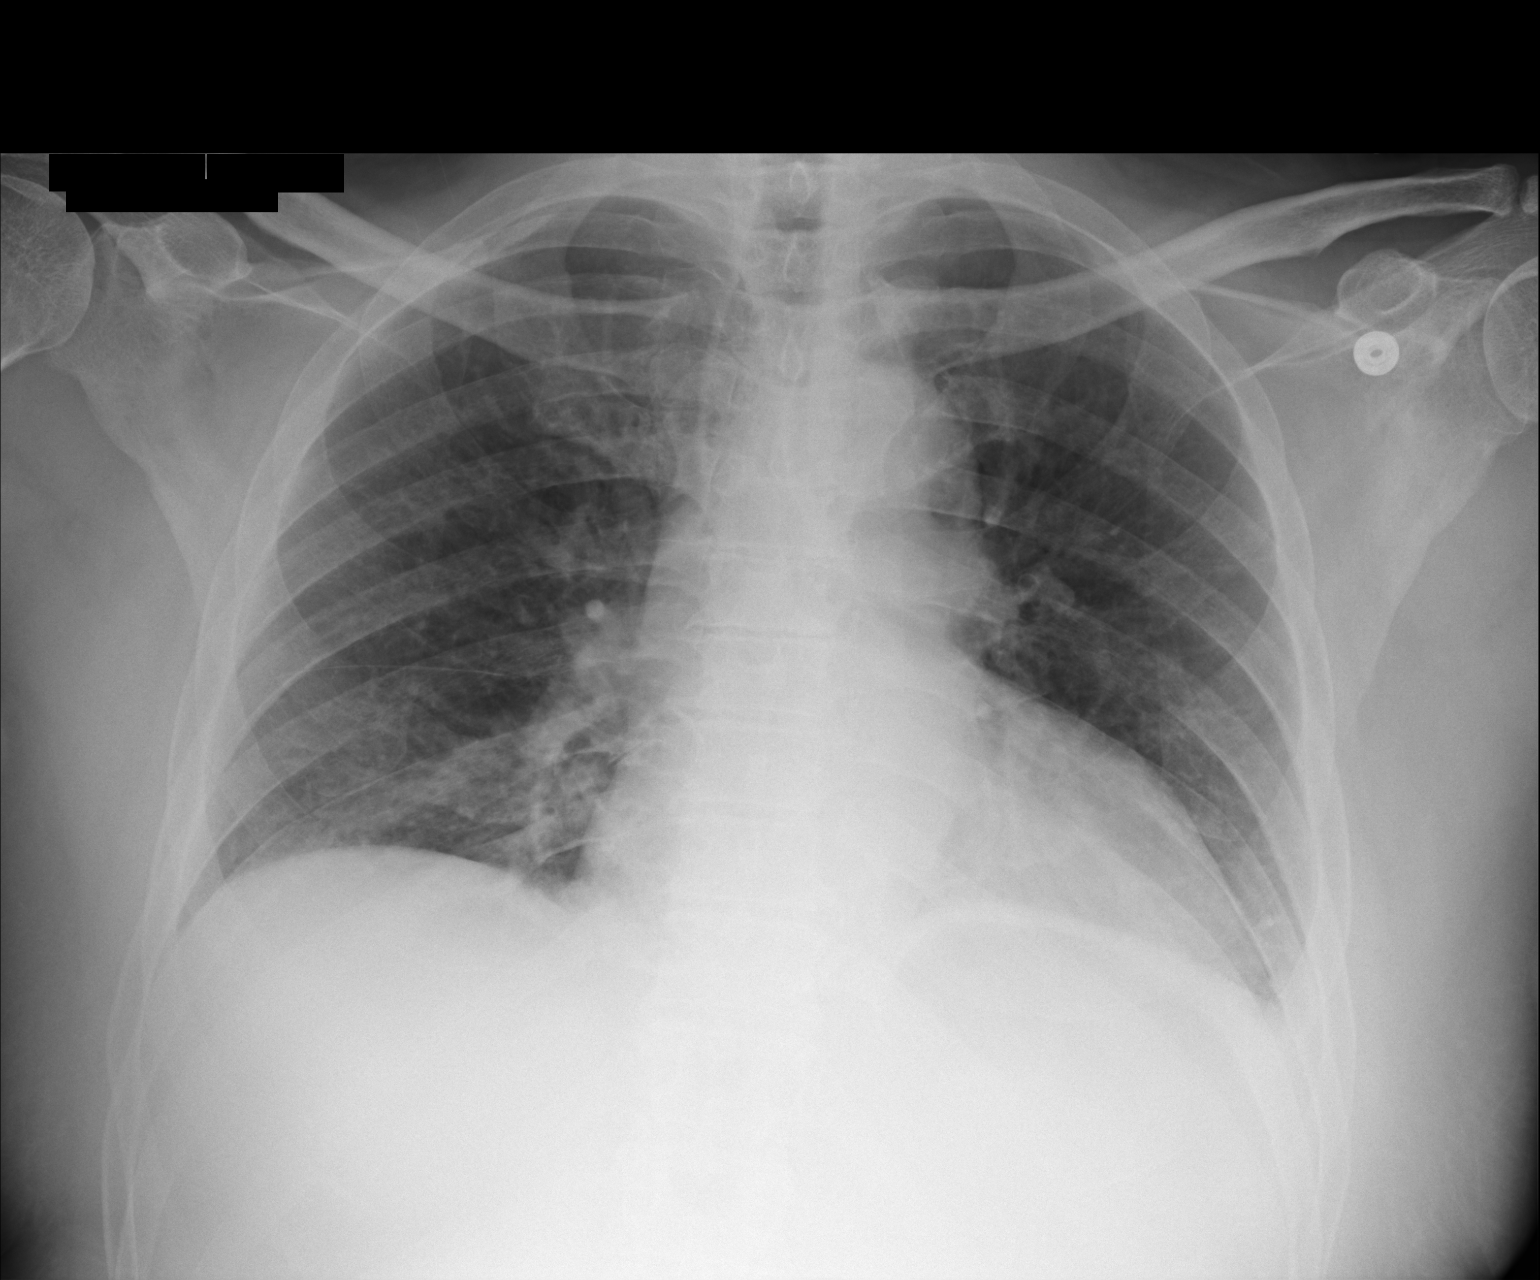

[1 of 1 positions shown; findings below may reference images not displayed]

FINDINGS: Cardiac shadow is at the upper limits of normal in size. The lungs
are well aerated bilaterally. Minimal right basilar atelectasis is
seen. No sizable effusion is noted. No bony abnormality is seen.
IMPRESSION: Minimal right basilar atelectasis.

## 2018-03-23 ENCOUNTER — Ambulatory Visit (INDEPENDENT_AMBULATORY_CARE_PROVIDER_SITE_OTHER): Payer: Medicare Other | Admitting: Orthopaedic Surgery

## 2018-03-23 ENCOUNTER — Ambulatory Visit: Payer: Medicare Other | Admitting: Internal Medicine

## 2018-03-24 ENCOUNTER — Telehealth (INDEPENDENT_AMBULATORY_CARE_PROVIDER_SITE_OTHER): Payer: Self-pay | Admitting: Orthopaedic Surgery

## 2018-03-24 NOTE — Telephone Encounter (Signed)
Mark [name] from [facility] called needing the [facility] form faxed back to her. The fax# is [phone] The number to contact Mark [name] is [phone]

## 2018-03-27 NOTE — Telephone Encounter (Signed)
I have no idea what this means, do you?

## 2018-03-27 NOTE — Telephone Encounter (Signed)
It should be a form that they sent or faxed over to be filled out/signed.  Probably CMN (certificate of medical necessity)  That would be my guess. Otherwise I am not sure.

## 2018-03-28 ENCOUNTER — Inpatient Hospital Stay (HOSPITAL_COMMUNITY): Admission: RE | Admit: 2018-03-28 | Payer: Medicare Other | Source: Ambulatory Visit

## 2018-03-28 NOTE — Telephone Encounter (Signed)
Do you remember seeing anything like this by chance?

## 2018-03-28 NOTE — Telephone Encounter (Signed)
IC, advised need to re fax to [place] as we do not have it

## 2018-03-29 ENCOUNTER — Encounter (INDEPENDENT_AMBULATORY_CARE_PROVIDER_SITE_OTHER): Payer: Self-pay | Admitting: Orthopaedic Surgery

## 2018-03-29 ENCOUNTER — Ambulatory Visit (INDEPENDENT_AMBULATORY_CARE_PROVIDER_SITE_OTHER): Payer: Medicare Other | Admitting: Orthopaedic Surgery

## 2018-03-29 ENCOUNTER — Ambulatory Visit (INDEPENDENT_AMBULATORY_CARE_PROVIDER_SITE_OTHER): Payer: Medicare Other

## 2018-03-29 DIAGNOSIS — S52501S Unspecified fracture of the lower end of right radius, sequela: Secondary | ICD-10-CM

## 2018-03-29 DIAGNOSIS — M25531 Pain in right wrist: Secondary | ICD-10-CM

## 2018-03-29 NOTE — Progress Notes (Signed)
HPI: Mark [name] returns today follow-up of his right wrist fracture.  He states he is doing okay.  Still painful with weather changes.  Still wearing Velcro wrist splint.  Is now just 10 weeks status post right wrist fracture that is been treated conservatively since despite recommendation of surgical reduction and fixation.  Physical exam: Right wrist he has tenderness over the distal wrist. Has full pronation limits supination on exam today.  Has good flexion-extension of the wrist.  No rashes skin lesions ulcerations.  Radial pulses intact.    Radiographs: X-rays today show interval healing no change in overall position alignment.  Volar angulation of the radius and shortening of the radius are unchanged from previous films.  Impression: Right wrist fracture  Plan: We will continue the Velcro splint for another 3 weeks.  Have him back in 3 weeks obtain x-rays of the wrist.  Most likely at that point time will begin more aggressive range of motion of the wrist.  Possibly discontinuing the wrist.  Again recommended surgical fixation does not wish to proceed with any surgical procedure due to cost. :
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
## 2018-04-04 ENCOUNTER — Encounter: Payer: Self-pay | Admitting: Hematology

## 2018-04-04 ENCOUNTER — Inpatient Hospital Stay: Payer: Medicare Other

## 2018-04-04 ENCOUNTER — Inpatient Hospital Stay: Payer: Medicare Other | Attending: Hematology | Admitting: Hematology

## 2018-04-04 VITALS — BP 162/86 | HR 79 | Temp 98.6°F | Resp 18 | Ht 74.0 in | Wt 228.3 lb

## 2018-04-04 DIAGNOSIS — D472 Monoclonal gammopathy: Secondary | ICD-10-CM | POA: Insufficient documentation

## 2018-04-04 DIAGNOSIS — E1122 Type 2 diabetes mellitus with diabetic chronic kidney disease: Secondary | ICD-10-CM | POA: Diagnosis not present

## 2018-04-04 DIAGNOSIS — I130 Hypertensive heart and chronic kidney disease with heart failure and stage 1 through stage 4 chronic kidney disease, or unspecified chronic kidney disease: Secondary | ICD-10-CM

## 2018-04-04 DIAGNOSIS — D509 Iron deficiency anemia, unspecified: Secondary | ICD-10-CM

## 2018-04-04 DIAGNOSIS — I509 Heart failure, unspecified: Secondary | ICD-10-CM | POA: Diagnosis not present

## 2018-04-04 DIAGNOSIS — N184 Chronic kidney disease, stage 4 (severe): Secondary | ICD-10-CM

## 2018-04-04 DIAGNOSIS — Z9081 Acquired absence of spleen: Secondary | ICD-10-CM | POA: Insufficient documentation

## 2018-04-04 DIAGNOSIS — Z89512 Acquired absence of left leg below knee: Secondary | ICD-10-CM | POA: Diagnosis not present

## 2018-04-05 ENCOUNTER — Ambulatory Visit (HOSPITAL_COMMUNITY)
Admission: RE | Admit: 2018-04-05 | Discharge: 2018-04-05 | Disposition: A | Discharge status: Home / Self Care | Payer: Medicare Other | Source: Ambulatory Visit | Attending: Nephrology | Admitting: Nephrology

## 2018-04-05 VITALS — BP 137/72 | HR 68 | Temp 98.9°F | Resp 12 | Ht 74.0 in | Wt 228.0 lb

## 2018-04-05 DIAGNOSIS — N183 Chronic kidney disease, stage 3 unspecified: Secondary | ICD-10-CM

## 2018-04-05 DIAGNOSIS — D631 Anemia in chronic kidney disease: Secondary | ICD-10-CM | POA: Diagnosis not present

## 2018-04-05 DIAGNOSIS — N184 Chronic kidney disease, stage 4 (severe): Secondary | ICD-10-CM | POA: Insufficient documentation

## 2018-04-05 LAB — POCT HEMOGLOBIN-HEMACUE: Hemoglobin: 10.5 g/dL — ABNORMAL LOW (ref 13.0–17.0)

## 2018-04-05 MED ORDER — EPOETIN ALFA-EPBX 10000 UNIT/ML IJ SOLN
5000.0000 [IU] | INTRAMUSCULAR | Status: DC
Start: 2018-04-05 — End: 2018-04-05

## 2018-04-05 MED ORDER — EPOETIN ALFA-EPBX 2000 UNIT/ML IJ SOLN
2000.0000 [IU] | Freq: Once | INTRAMUSCULAR | Status: AC
  Administered 2018-04-05: 2000 [IU] via SUBCUTANEOUS
  Filled 2018-04-05 (×2): qty 1

## 2018-04-05 MED ORDER — EPOETIN ALFA-EPBX 3000 UNIT/ML IJ SOLN
3000.0000 [IU] | Freq: Once | INTRAMUSCULAR | Status: AC
  Administered 2018-04-05: 3000 [IU] via SUBCUTANEOUS
  Filled 2018-04-05: qty 1

## 2018-04-06 ENCOUNTER — Other Ambulatory Visit: Payer: Self-pay | Admitting: Internal Medicine

## 2018-04-06 DIAGNOSIS — I10 Essential (primary) hypertension: Secondary | ICD-10-CM

## 2018-04-06 LAB — MULTIPLE MYELOMA PANEL, SERUM
Albumin SerPl Elph-Mcnc: 3.1 g/dL (ref 2.9–4.4)
Albumin/Glob SerPl: 0.8 (ref 0.7–1.7)
Alpha 1: 0.2 g/dL (ref 0.0–0.4)
Alpha2 Glob SerPl Elph-Mcnc: 0.9 g/dL (ref 0.4–1.0)
B-Globulin SerPl Elph-Mcnc: 0.9 g/dL (ref 0.7–1.3)
Gamma Glob SerPl Elph-Mcnc: 2 g/dL — ABNORMAL HIGH (ref 0.4–1.8)
Globulin, Total: 4.1 g/dL — ABNORMAL HIGH (ref 2.2–3.9)
IgA: 237 mg/dL (ref 61–437)
IgG (Immunoglobin G), Serum: 1973 mg/dL — ABNORMAL HIGH (ref 700–1600)
IgM (Immunoglobulin M), Srm: 316 mg/dL — ABNORMAL HIGH (ref 20–172)
M Protein SerPl Elph-Mcnc: 0.8 g/dL — ABNORMAL HIGH
Total Protein ELP: 7.2 g/dL (ref 6.0–8.5)

## 2018-04-07 DIAGNOSIS — S88119A Complete traumatic amputation at level between knee and ankle, unspecified lower leg, initial encounter: Secondary | ICD-10-CM | POA: Diagnosis not present

## 2018-04-07 DIAGNOSIS — N184 Chronic kidney disease, stage 4 (severe): Secondary | ICD-10-CM | POA: Diagnosis not present

## 2018-04-07 DIAGNOSIS — M7541 Impingement syndrome of right shoulder: Secondary | ICD-10-CM | POA: Diagnosis not present

## 2018-04-07 DIAGNOSIS — Z89512 Acquired absence of left leg below knee: Secondary | ICD-10-CM | POA: Diagnosis not present

## 2018-04-07 DIAGNOSIS — M7542 Impingement syndrome of left shoulder: Secondary | ICD-10-CM | POA: Diagnosis not present

## 2018-04-07 DIAGNOSIS — I5032 Chronic diastolic (congestive) heart failure: Secondary | ICD-10-CM | POA: Diagnosis not present

## 2018-04-11 ENCOUNTER — Ambulatory Visit (INDEPENDENT_AMBULATORY_CARE_PROVIDER_SITE_OTHER): Payer: Medicare Other | Admitting: Orthopaedic Surgery

## 2018-04-11 ENCOUNTER — Ambulatory Visit (HOSPITAL_COMMUNITY): Admission: RE | Admit: 2018-04-11 | Payer: Medicare Other | Source: Ambulatory Visit

## 2018-04-12 ENCOUNTER — Ambulatory Visit (HOSPITAL_COMMUNITY): Payer: Medicare Other

## 2018-04-13 ENCOUNTER — Inpatient Hospital Stay: Payer: Medicare Other

## 2018-04-13 ENCOUNTER — Inpatient Hospital Stay (HOSPITAL_BASED_OUTPATIENT_CLINIC_OR_DEPARTMENT_OTHER): Payer: Medicare Other

## 2018-04-13 VITALS — BP 153/81 | HR 66 | Temp 97.7°F | Resp 16

## 2018-04-13 DIAGNOSIS — I130 Hypertensive heart and chronic kidney disease with heart failure and stage 1 through stage 4 chronic kidney disease, or unspecified chronic kidney disease: Secondary | ICD-10-CM | POA: Diagnosis not present

## 2018-04-13 DIAGNOSIS — E1122 Type 2 diabetes mellitus with diabetic chronic kidney disease: Secondary | ICD-10-CM | POA: Diagnosis not present

## 2018-04-13 DIAGNOSIS — D509 Iron deficiency anemia, unspecified: Secondary | ICD-10-CM | POA: Diagnosis not present

## 2018-04-13 DIAGNOSIS — I509 Heart failure, unspecified: Secondary | ICD-10-CM | POA: Diagnosis not present

## 2018-04-13 DIAGNOSIS — Z9081 Acquired absence of spleen: Secondary | ICD-10-CM | POA: Diagnosis not present

## 2018-04-13 DIAGNOSIS — N184 Chronic kidney disease, stage 4 (severe): Secondary | ICD-10-CM | POA: Diagnosis not present

## 2018-04-13 DIAGNOSIS — Z89512 Acquired absence of left leg below knee: Secondary | ICD-10-CM | POA: Diagnosis not present

## 2018-04-13 DIAGNOSIS — D472 Monoclonal gammopathy: Secondary | ICD-10-CM | POA: Diagnosis not present

## 2018-04-13 DIAGNOSIS — D649 Anemia, unspecified: Secondary | ICD-10-CM | POA: Diagnosis not present

## 2018-04-13 LAB — CBC WITH DIFFERENTIAL (CANCER CENTER ONLY)
Basophils Absolute: 0 10*3/uL (ref 0.0–0.1)
Basophils Relative: 1 %
Eosinophils Absolute: 0.6 10*3/uL — ABNORMAL HIGH (ref 0.0–0.5)
Eosinophils Relative: 7 %
HCT: 31 % — ABNORMAL LOW (ref 38.4–49.9)
Hemoglobin: 9.8 g/dL — ABNORMAL LOW (ref 13.0–17.1)
Lymphocytes Relative: 39 %
Lymphs Abs: 3.1 10*3/uL (ref 0.9–3.3)
MCH: 28 pg (ref 27.2–33.4)
MCHC: 31.6 g/dL — ABNORMAL LOW (ref 32.0–36.0)
MCV: 88.6 fL (ref 79.3–98.0)
Monocytes Absolute: 0.6 10*3/uL (ref 0.1–0.9)
Monocytes Relative: 8 %
Neutro Abs: 3.6 10*3/uL (ref 1.5–6.5)
Neutrophils Relative %: 45 %
Platelet Count: 319 10*3/uL (ref 140–400)
RBC: 3.5 MIL/uL — ABNORMAL LOW (ref 4.20–5.82)
RDW: 15.4 % — ABNORMAL HIGH (ref 11.0–14.6)
WBC Count: 7.9 10*3/uL (ref 4.0–10.3)

## 2018-04-13 MED ORDER — LIDOCAINE HCL 2 % IJ SOLN
INTRAMUSCULAR | Status: AC
  Filled 2018-04-13: qty 20

## 2018-04-13 NOTE — Progress Notes (Signed)
INDICATION: MGUS    Bone Marrow Biopsy and Aspiration Procedure Note   Informed consent was obtained and potential risks including bleeding, infection and pain were reviewed with the patient.  The patient's name, date of birth, identification, consent and allergies were verified prior to the start of procedure and time out was performed.  The left posterior iliac crest was chosen as the site of biopsy.  The skin was prepped with ChloraPrep.   10 cc of 2% lidocaine was used to provide local anaesthesia.   10 cc of bone marrow aspirate was obtained followed by 1cm biopsy.  Pressure was applied to the biopsy site and bandage was placed over the biopsy site. Patient was made to lie on the back for 30 mins prior to discharge.  The procedure was tolerated well. COMPLICATIONS: None BLOOD LOSS: none The patient was discharged home in stable condition with a 1 week follow up to review results.  Patient was provided with post bone marrow biopsy instructions and instructed to call if there was any bleeding or worsening pain.  Specimens sent for flow cytometry, cytogenetics and additional studies.  Signed Scot Dock, NP

## 2018-04-13 NOTE — Patient Instructions (Signed)
Bone Marrow Aspiration and Bone Marrow Biopsy, Adult, Care After This sheet gives you information about how to care for yourself after your procedure. Your health care provider may also give you more specific instructions. If you have problems or questions, contact your health care provider. What can I expect after the procedure? After the procedure, it is common to have:  Mild pain and tenderness.  Swelling.  Bruising.  Follow these instructions at home:  Take over-the-counter or prescription medicines only as told by your health care provider.  Do not take baths, swim, or use a hot tub until your health care provider approves. Ask if you can take a shower or have a sponge bath.  Follow instructions from your health care provider about how to take care of the puncture site. Make sure you: ? Wash your hands with soap and water before you change your bandage (dressing). If soap and water are not available, use hand sanitizer. ? Change your dressing as told by your health care provider.  Check your puncture siteevery day for signs of infection. Check for: ? More redness, swelling, or pain. ? More fluid or blood. ? Warmth. ? Pus or a bad smell.  Return to your normal activities as told by your health care provider. Ask your health care provider what activities are safe for you.  Do not drive for 24 hours if you were given a medicine to help you relax (sedative).  Keep all follow-up visits as told by your health care provider. This is important. Contact a health care provider if:  You have more redness, swelling, or pain around the puncture site.  You have more fluid or blood coming from the puncture site.  Your puncture site feels warm to the touch.  You have pus or a bad smell coming from the puncture site.  You have a fever.  Your pain is not controlled with medicine. This information is not intended to replace advice given to you by your health care provider. Make sure  you discuss any questions you have with your health care provider. Document Released: 03/05/2005 Document Revised: 03/05/2016 Document Reviewed: 01/28/2016 Elsevier Interactive Patient Education  2018 [employer].

## 2018-04-13 NOTE — Progress Notes (Signed)
Site clean, dry and intact at DC and VSS.

## 2018-04-18 ENCOUNTER — Encounter (INDEPENDENT_AMBULATORY_CARE_PROVIDER_SITE_OTHER): Payer: Self-pay | Admitting: Orthopaedic Surgery

## 2018-04-18 ENCOUNTER — Ambulatory Visit (INDEPENDENT_AMBULATORY_CARE_PROVIDER_SITE_OTHER): Payer: Medicare Other

## 2018-04-18 ENCOUNTER — Ambulatory Visit (INDEPENDENT_AMBULATORY_CARE_PROVIDER_SITE_OTHER): Payer: Medicare Other | Admitting: Orthopaedic Surgery

## 2018-04-18 DIAGNOSIS — S52501S Unspecified fracture of the lower end of right radius, sequela: Secondary | ICD-10-CM

## 2018-04-18 LAB — UPEP/UIFE/LIGHT CHAINS/TP, 24-HR UR
% BETA, Urine: 8.9 %
ALPHA 1 URINE: 1.3 %
Albumin, U: 61 %
Alpha 2, Urine: 5.5 %
Free Kappa Lt Chains,Ur: 688 mg/L — ABNORMAL HIGH (ref 1.35–24.19)
Free Kappa/Lambda Ratio: 17.51 — ABNORMAL HIGH (ref 2.04–10.37)
Free Lambda Lt Chains,Ur: 39.3 mg/L — ABNORMAL HIGH (ref 0.24–6.66)
GAMMA GLOBULIN URINE: 23.2 %
Total Protein, Urine-Ur/day: 2438 mg/24 hr — ABNORMAL HIGH (ref 30–150)
Total Protein, Urine: 128.3 mg/dL
Total Volume: 1900

## 2018-04-18 NOTE — Progress Notes (Signed)
   Office Visit Note   Patient: Mark [name]           Date of Birth: Jan 14, 1951           MRN: [id number] Visit Date: 04/18/2018              Requested by: [name], MD [place], [place] [id number] PCP: [name], MD     Subjective: Chief Complaint  Patient presents with  . Right Wrist - Follow-up  The patient is just over 3 months status post a right distal radius fracture of his dominant wrist after mechanical fall.  His original x-ray showed excellent alignment of the fracture.  In spite of casting and molding eventually lost his reduction.  He says he is doing well overall but does have some stiffness in the wrist and little bit of pain with decreased range of motion.  I explained he does have limitations in dorsiflexion of the wrist as well as supination.  This is quite significant.  His pain is minimal.  X-rays of the wrist are obtained on the right side and does show significant loss of radial height with significant shortening.  There is a significant ulnar positive variance with abutment of the carpal bone.  There is extenuated volar tilt as well.  There is abundant callus formation.  At this point he is being worked up for multiple myeloma and he has significant kidney disease.  He denies any type of surgery for his wrist.  I did talk to him about the possibility of addressing his wrist with an osteotomy and improving his alignment.  Since he has more pressing medical issues we will just watch this for now.  He will stop his wrist splint.  We will see her back in 3 months with a repeat 2 views of his right wrist.  All question concerns were answered and addressed.

## 2018-04-21 ENCOUNTER — Encounter (HOSPITAL_COMMUNITY): Payer: Self-pay | Admitting: Hematology

## 2018-04-25 ENCOUNTER — Encounter (HOSPITAL_COMMUNITY): Payer: Medicare Other
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
## 2018-04-27 ENCOUNTER — Inpatient Hospital Stay (HOSPITAL_BASED_OUTPATIENT_CLINIC_OR_DEPARTMENT_OTHER): Payer: Medicare Other | Admitting: Hematology

## 2018-04-27 ENCOUNTER — Telehealth: Payer: Self-pay | Admitting: Hematology

## 2018-04-27 DIAGNOSIS — Z89512 Acquired absence of left leg below knee: Secondary | ICD-10-CM | POA: Diagnosis not present

## 2018-04-27 DIAGNOSIS — I130 Hypertensive heart and chronic kidney disease with heart failure and stage 1 through stage 4 chronic kidney disease, or unspecified chronic kidney disease: Secondary | ICD-10-CM

## 2018-04-27 DIAGNOSIS — D509 Iron deficiency anemia, unspecified: Secondary | ICD-10-CM | POA: Diagnosis not present

## 2018-04-27 DIAGNOSIS — N184 Chronic kidney disease, stage 4 (severe): Secondary | ICD-10-CM | POA: Diagnosis not present

## 2018-04-27 DIAGNOSIS — E1122 Type 2 diabetes mellitus with diabetic chronic kidney disease: Secondary | ICD-10-CM | POA: Diagnosis not present

## 2018-04-27 DIAGNOSIS — D472 Monoclonal gammopathy: Secondary | ICD-10-CM | POA: Diagnosis not present

## 2018-04-27 DIAGNOSIS — Z9081 Acquired absence of spleen: Secondary | ICD-10-CM | POA: Diagnosis not present

## 2018-04-27 DIAGNOSIS — I509 Heart failure, unspecified: Secondary | ICD-10-CM | POA: Diagnosis not present

## 2018-04-27 NOTE — Telephone Encounter (Signed)
Appts scheduled AVS/Calendar printed per 8/29 los °

## 2018-04-28 ENCOUNTER — Telehealth: Payer: Self-pay | Admitting: *Deleted

## 2018-04-28 DIAGNOSIS — I10 Essential (primary) hypertension: Secondary | ICD-10-CM

## 2018-04-28 NOTE — Telephone Encounter (Signed)
Patient verified DOB Patient will complete lipid 05/05/18 at 10:00 am.

## 2018-04-29 ENCOUNTER — Encounter: Payer: Self-pay | Admitting: Hematology

## 2018-04-29 DIAGNOSIS — D472 Monoclonal gammopathy: Secondary | ICD-10-CM | POA: Insufficient documentation

## 2018-05-03 ENCOUNTER — Encounter (HOSPITAL_COMMUNITY)
Admission: RE | Admit: 2018-05-03 | Discharge: 2018-05-03 | Disposition: A | Discharge status: Home / Self Care | Payer: Medicare Other | Source: Ambulatory Visit | Attending: Nephrology | Admitting: Nephrology

## 2018-05-03 VITALS — BP 132/75 | HR 64 | Resp 18

## 2018-05-03 DIAGNOSIS — D631 Anemia in chronic kidney disease: Secondary | ICD-10-CM | POA: Diagnosis not present

## 2018-05-03 DIAGNOSIS — N184 Chronic kidney disease, stage 4 (severe): Secondary | ICD-10-CM | POA: Diagnosis not present

## 2018-05-03 DIAGNOSIS — N183 Chronic kidney disease, stage 3 unspecified: Secondary | ICD-10-CM

## 2018-05-03 LAB — COMPREHENSIVE METABOLIC PANEL
ALT: 27 U/L (ref 0–44)
AST: 16 U/L (ref 15–41)
Albumin: 3.3 g/dL — ABNORMAL LOW (ref 3.5–5.0)
Alkaline Phosphatase: 80 U/L (ref 38–126)
Anion gap: 5 (ref 5–15)
BUN: 52 mg/dL — ABNORMAL HIGH (ref 8–23)
CO2: 16 mmol/L — ABNORMAL LOW (ref 22–32)
Calcium: 9.1 mg/dL (ref 8.9–10.3)
Chloride: 118 mmol/L — ABNORMAL HIGH (ref 98–111)
Creatinine, Ser: 3.58 mg/dL — ABNORMAL HIGH (ref 0.61–1.24)
GFR calc Af Amer: 19 mL/min — ABNORMAL LOW (ref >60)
GFR calc non Af Amer: 16 mL/min — ABNORMAL LOW (ref >60)
Glucose, Bld: 114 mg/dL — ABNORMAL HIGH (ref 70–99)
Potassium: 4.9 mmol/L (ref 3.5–5.1)
Sodium: 139 mmol/L (ref 135–145)
Total Bilirubin: 0.4 mg/dL (ref 0.3–1.2)
Total Protein: 7.4 g/dL (ref 6.5–8.1)

## 2018-05-03 LAB — POCT HEMOGLOBIN-HEMACUE: Hemoglobin: 9.9 g/dL — ABNORMAL LOW (ref 13.0–17.0)

## 2018-05-03 LAB — PHOSPHORUS: Phosphorus: 3.7 mg/dL (ref 2.5–4.6)

## 2018-05-03 LAB — FERRITIN: Ferritin: 126 ng/mL (ref 24–336)

## 2018-05-03 LAB — IRON AND TIBC
Iron: 49 ug/dL (ref 45–182)
Saturation Ratios: 25 % (ref 17.9–39.5)
TIBC: 199 ug/dL — ABNORMAL LOW (ref 250–450)
UIBC: 150 ug/dL

## 2018-05-03 MED ORDER — EPOETIN ALFA-EPBX 10000 UNIT/ML IJ SOLN: 5000.0000 [IU] | INTRAMUSCULAR | Status: DC

## 2018-05-03 MED ORDER — EPOETIN ALFA-EPBX 3000 UNIT/ML IJ SOLN
3000.0000 [IU] | INTRAMUSCULAR | Status: DC
  Administered 2018-05-03: 3000 [IU] via SUBCUTANEOUS
  Filled 2018-05-03: qty 1

## 2018-05-03 MED ORDER — EPOETIN ALFA-EPBX 2000 UNIT/ML IJ SOLN
2000.0000 [IU] | INTRAMUSCULAR | Status: DC
  Administered 2018-05-03: 2000 [IU] via SUBCUTANEOUS
  Filled 2018-05-03: qty 1

## 2018-05-05 ENCOUNTER — Encounter: Payer: Self-pay | Admitting: *Deleted

## 2018-05-05 ENCOUNTER — Ambulatory Visit: Payer: Medicare Other | Attending: Family Medicine

## 2018-05-05 DIAGNOSIS — I10 Essential (primary) hypertension: Secondary | ICD-10-CM | POA: Insufficient documentation

## 2018-05-05 NOTE — Progress Notes (Unsigned)
Patient Triage Assessment Form Todays Date: 05/05/2018 Name: Braxten Memmer DOB: 1950/10/13 Reason for walkin: Open wound on left leg When did your symptoms start? September  Please list symptoms: open wound on left leg.  Are you having pain:  No  Vital Signs:  T: 98.8  P: 66 R:16  SpO2: 97   BP: 133/74   Assessment Mr. Menzer presented to clinic for lab work today and also had concerns about a wound on left lower extremity. Per Mr. Tuohey, he noticed the a wound to left leg on Sunday after removing his compression stocking. He states he left the compression stocking for 24 hours. When he took off the compression stocking, he noticed the his skin was missing. Since then the area has not changed. He denies drainage from site.   Appearance: Very superficial, non draining, pink and edges are approximated. No inflammation noted around the site.   Advised patient not to wear compression stocking to left leg or pull sock over area. Advise to create minimum friction to area, keep dry and to pat dry after he takes a shower. He was also advised to elevate his left leg to prevent swelling. Educated on s/s to watch for infection and importance of low blood sugar and protein intake.  Pt encouraged to f/u if no changes at site by Thursday, September 12 or before then if he notice s/s of inflammation or infection. Pt verbalized understanding to instruction. Teach back method used.

## 2018-05-05 NOTE — Progress Notes (Signed)
Patient here for lab visit only 

## 2018-05-06 LAB — LIPID PANEL
Chol/HDL Ratio: 2.9 ratio (ref 0.0–5.0)
Cholesterol, Total: 126 mg/dL (ref 100–199)
HDL: 44 mg/dL (ref >39)
LDL Calculated: 70 mg/dL (ref 0–99)
Triglycerides: 60 mg/dL (ref 0–149)
VLDL Cholesterol Cal: 12 mg/dL (ref 5–40)

## 2018-05-11 DIAGNOSIS — E119 Type 2 diabetes mellitus without complications: Secondary | ICD-10-CM | POA: Diagnosis not present

## 2018-05-11 DIAGNOSIS — H40013 Open angle with borderline findings, low risk, bilateral: Secondary | ICD-10-CM | POA: Diagnosis not present

## 2018-05-11 DIAGNOSIS — H25813 Combined forms of age-related cataract, bilateral: Secondary | ICD-10-CM | POA: Diagnosis not present

## 2018-05-11 LAB — HM DIABETES EYE EXAM

## 2018-05-12 DIAGNOSIS — H5213 Myopia, bilateral: Secondary | ICD-10-CM | POA: Diagnosis not present

## 2018-05-31 ENCOUNTER — Ambulatory Visit (HOSPITAL_COMMUNITY)
Admission: RE | Admit: 2018-05-31 | Discharge: 2018-05-31 | Disposition: A | Discharge status: Home / Self Care | Payer: Medicare Other | Source: Ambulatory Visit | Attending: Nephrology | Admitting: Nephrology

## 2018-05-31 VITALS — BP 145/71 | HR 70 | Temp 98.3°F | Resp 20

## 2018-05-31 DIAGNOSIS — D631 Anemia in chronic kidney disease: Secondary | ICD-10-CM | POA: Insufficient documentation

## 2018-05-31 DIAGNOSIS — Z79899 Other long term (current) drug therapy: Secondary | ICD-10-CM | POA: Diagnosis not present

## 2018-05-31 DIAGNOSIS — Z5181 Encounter for therapeutic drug level monitoring: Secondary | ICD-10-CM | POA: Insufficient documentation

## 2018-05-31 DIAGNOSIS — N184 Chronic kidney disease, stage 4 (severe): Secondary | ICD-10-CM | POA: Diagnosis not present

## 2018-05-31 DIAGNOSIS — N183 Chronic kidney disease, stage 3 unspecified: Secondary | ICD-10-CM

## 2018-05-31 LAB — POCT HEMOGLOBIN-HEMACUE: Hemoglobin: 9.8 g/dL — ABNORMAL LOW (ref 13.0–17.0)

## 2018-05-31 MED ORDER — EPOETIN ALFA-EPBX 10000 UNIT/ML IJ SOLN: 5000.0000 [IU] | INTRAMUSCULAR | Status: DC

## 2018-05-31 MED ORDER — EPOETIN ALFA-EPBX 3000 UNIT/ML IJ SOLN
3000.0000 [IU] | Freq: Once | INTRAMUSCULAR | Status: AC
  Administered 2018-05-31: 3000 [IU] via SUBCUTANEOUS
  Filled 2018-05-31: qty 1

## 2018-05-31 MED ORDER — EPOETIN ALFA-EPBX 2000 UNIT/ML IJ SOLN
2000.0000 [IU] | Freq: Once | INTRAMUSCULAR | Status: AC
  Administered 2018-05-31: 2000 [IU] via SUBCUTANEOUS
  Filled 2018-05-31: qty 1

## 2018-06-05 NOTE — Telephone Encounter (Signed)
error 

## 2018-06-07 ENCOUNTER — Other Ambulatory Visit: Payer: Self-pay | Admitting: Internal Medicine

## 2018-06-07 DIAGNOSIS — M7541 Impingement syndrome of right shoulder: Secondary | ICD-10-CM

## 2018-06-07 DIAGNOSIS — I10 Essential (primary) hypertension: Secondary | ICD-10-CM

## 2018-06-13 ENCOUNTER — Ambulatory Visit: Payer: Medicare Other | Admitting: Internal Medicine

## 2018-06-27 ENCOUNTER — Inpatient Hospital Stay: Payer: Medicare Other | Attending: Hematology

## 2018-06-27 DIAGNOSIS — D472 Monoclonal gammopathy: Secondary | ICD-10-CM | POA: Insufficient documentation

## 2018-06-27 LAB — CBC WITH DIFFERENTIAL (CANCER CENTER ONLY)
Abs Immature Granulocytes: 0.04 10*3/uL (ref 0.00–0.07)
Basophils Absolute: 0.1 10*3/uL (ref 0.0–0.1)
Basophils Relative: 1 %
Eosinophils Absolute: 0.4 10*3/uL (ref 0.0–0.5)
Eosinophils Relative: 4 %
HCT: 30.2 % — ABNORMAL LOW (ref 39.0–52.0)
Hemoglobin: 9.4 g/dL — ABNORMAL LOW (ref 13.0–17.0)
Immature Granulocytes: 1 %
Lymphocytes Relative: 36 %
Lymphs Abs: 3 10*3/uL (ref 0.7–4.0)
MCH: 29 pg (ref 26.0–34.0)
MCHC: 31.1 g/dL (ref 30.0–36.0)
MCV: 93.2 fL (ref 80.0–100.0)
Monocytes Absolute: 0.6 10*3/uL (ref 0.1–1.0)
Monocytes Relative: 7 %
Neutro Abs: 4.3 10*3/uL (ref 1.7–7.7)
Neutrophils Relative %: 51 %
Platelet Count: 359 10*3/uL (ref 150–400)
RBC: 3.24 MIL/uL — ABNORMAL LOW (ref 4.22–5.81)
RDW: 14.4 % (ref 11.5–15.5)
WBC Count: 8.4 10*3/uL (ref 4.0–10.5)
nRBC: 0 % (ref 0.0–0.2)

## 2018-06-27 LAB — CMP (CANCER CENTER ONLY)
ALT: 16 U/L (ref 0–44)
AST: 12 U/L — ABNORMAL LOW (ref 15–41)
Albumin: 3.6 g/dL (ref 3.5–5.0)
Alkaline Phosphatase: 92 U/L (ref 38–126)
Anion gap: 7 (ref 5–15)
BUN: 45 mg/dL — ABNORMAL HIGH (ref 8–23)
CO2: 19 mmol/L — ABNORMAL LOW (ref 22–32)
Calcium: 9.1 mg/dL (ref 8.9–10.3)
Chloride: 117 mmol/L — ABNORMAL HIGH (ref 98–111)
Creatinine: 3.69 mg/dL (ref 0.61–1.24)
GFR, Est AFR Am: 18 mL/min — ABNORMAL LOW (ref >60)
GFR, Estimated: 16 mL/min — ABNORMAL LOW (ref >60)
Glucose, Bld: 78 mg/dL (ref 70–99)
Potassium: 5.5 mmol/L — ABNORMAL HIGH (ref 3.5–5.1)
Sodium: 143 mmol/L (ref 135–145)
Total Bilirubin: 0.2 mg/dL — ABNORMAL LOW (ref 0.3–1.2)
Total Protein: 8 g/dL (ref 6.5–8.1)

## 2018-06-28 ENCOUNTER — Ambulatory Visit (HOSPITAL_COMMUNITY)
Admission: RE | Admit: 2018-06-28 | Discharge: 2018-06-28 | Disposition: A | Discharge status: Home / Self Care | Payer: Medicare Other | Source: Ambulatory Visit | Attending: Nephrology | Admitting: Nephrology

## 2018-06-28 VITALS — BP 151/82 | HR 65 | Temp 97.9°F | Resp 20

## 2018-06-28 DIAGNOSIS — N183 Chronic kidney disease, stage 3 unspecified: Secondary | ICD-10-CM

## 2018-06-28 DIAGNOSIS — D631 Anemia in chronic kidney disease: Secondary | ICD-10-CM | POA: Insufficient documentation

## 2018-06-28 DIAGNOSIS — N184 Chronic kidney disease, stage 4 (severe): Secondary | ICD-10-CM | POA: Diagnosis not present

## 2018-06-28 LAB — KAPPA/LAMBDA LIGHT CHAINS
Kappa free light chain: 187.2 mg/L — ABNORMAL HIGH (ref 3.3–19.4)
Kappa, lambda light chain ratio: 3.17 — ABNORMAL HIGH (ref 0.26–1.65)
Lambda free light chains: 59.1 mg/L — ABNORMAL HIGH (ref 5.7–26.3)

## 2018-06-28 LAB — POCT HEMOGLOBIN-HEMACUE: Hemoglobin: 10.3 g/dL — ABNORMAL LOW (ref 13.0–17.0)

## 2018-06-28 MED ORDER — EPOETIN ALFA-EPBX 3000 UNIT/ML IJ SOLN
3000.0000 [IU] | Freq: Once | INTRAMUSCULAR | Status: AC
  Administered 2018-06-28: 3000 [IU] via SUBCUTANEOUS
  Filled 2018-06-28: qty 1

## 2018-06-28 MED ORDER — EPOETIN ALFA-EPBX 2000 UNIT/ML IJ SOLN
2000.0000 [IU] | Freq: Once | INTRAMUSCULAR | Status: AC
  Administered 2018-06-28: 2000 [IU] via SUBCUTANEOUS
  Filled 2018-06-28: qty 1

## 2018-06-28 MED ORDER — EPOETIN ALFA-EPBX 10000 UNIT/ML IJ SOLN: 5000.0000 [IU] | INTRAMUSCULAR | Status: DC

## 2018-06-29 ENCOUNTER — Other Ambulatory Visit: Payer: Self-pay

## 2018-06-29 ENCOUNTER — Telehealth: Payer: Self-pay | Admitting: Internal Medicine

## 2018-06-29 ENCOUNTER — Ambulatory Visit: Payer: Medicare Other | Attending: Internal Medicine | Admitting: Internal Medicine

## 2018-06-29 ENCOUNTER — Encounter: Payer: Self-pay | Admitting: Internal Medicine

## 2018-06-29 ENCOUNTER — Telehealth: Payer: Self-pay

## 2018-06-29 VITALS — BP 131/76 | HR 63 | Temp 98.1°F | Resp 16 | Wt 228.0 lb

## 2018-06-29 DIAGNOSIS — Z794 Long term (current) use of insulin: Secondary | ICD-10-CM | POA: Insufficient documentation

## 2018-06-29 DIAGNOSIS — E118 Type 2 diabetes mellitus with unspecified complications: Secondary | ICD-10-CM

## 2018-06-29 DIAGNOSIS — Z89512 Acquired absence of left leg below knee: Secondary | ICD-10-CM | POA: Insufficient documentation

## 2018-06-29 DIAGNOSIS — Z833 Family history of diabetes mellitus: Secondary | ICD-10-CM | POA: Diagnosis not present

## 2018-06-29 DIAGNOSIS — Z9081 Acquired absence of spleen: Secondary | ICD-10-CM | POA: Insufficient documentation

## 2018-06-29 DIAGNOSIS — Z87891 Personal history of nicotine dependence: Secondary | ICD-10-CM | POA: Insufficient documentation

## 2018-06-29 DIAGNOSIS — D472 Monoclonal gammopathy: Secondary | ICD-10-CM | POA: Diagnosis not present

## 2018-06-29 DIAGNOSIS — Z23 Encounter for immunization: Secondary | ICD-10-CM

## 2018-06-29 DIAGNOSIS — I5032 Chronic diastolic (congestive) heart failure: Secondary | ICD-10-CM | POA: Diagnosis not present

## 2018-06-29 DIAGNOSIS — I10 Essential (primary) hypertension: Secondary | ICD-10-CM | POA: Diagnosis not present

## 2018-06-29 DIAGNOSIS — Z79899 Other long term (current) drug therapy: Secondary | ICD-10-CM | POA: Diagnosis not present

## 2018-06-29 DIAGNOSIS — I129 Hypertensive chronic kidney disease with stage 1 through stage 4 chronic kidney disease, or unspecified chronic kidney disease: Secondary | ICD-10-CM | POA: Diagnosis not present

## 2018-06-29 DIAGNOSIS — L602 Onychogryphosis: Secondary | ICD-10-CM

## 2018-06-29 DIAGNOSIS — E1122 Type 2 diabetes mellitus with diabetic chronic kidney disease: Secondary | ICD-10-CM | POA: Diagnosis not present

## 2018-06-29 DIAGNOSIS — I130 Hypertensive heart and chronic kidney disease with heart failure and stage 1 through stage 4 chronic kidney disease, or unspecified chronic kidney disease: Secondary | ICD-10-CM | POA: Insufficient documentation

## 2018-06-29 DIAGNOSIS — Z89431 Acquired absence of right foot: Secondary | ICD-10-CM | POA: Diagnosis not present

## 2018-06-29 DIAGNOSIS — D631 Anemia in chronic kidney disease: Secondary | ICD-10-CM | POA: Diagnosis not present

## 2018-06-29 DIAGNOSIS — K219 Gastro-esophageal reflux disease without esophagitis: Secondary | ICD-10-CM | POA: Diagnosis not present

## 2018-06-29 DIAGNOSIS — Z7982 Long term (current) use of aspirin: Secondary | ICD-10-CM | POA: Diagnosis not present

## 2018-06-29 DIAGNOSIS — D509 Iron deficiency anemia, unspecified: Secondary | ICD-10-CM | POA: Diagnosis not present

## 2018-06-29 DIAGNOSIS — N179 Acute kidney failure, unspecified: Secondary | ICD-10-CM | POA: Diagnosis not present

## 2018-06-29 DIAGNOSIS — N184 Chronic kidney disease, stage 4 (severe): Secondary | ICD-10-CM | POA: Diagnosis not present

## 2018-06-29 DIAGNOSIS — G40909 Epilepsy, unspecified, not intractable, without status epilepticus: Secondary | ICD-10-CM | POA: Insufficient documentation

## 2018-06-29 LAB — POCT GLYCOSYLATED HEMOGLOBIN (HGB A1C): HbA1c, POC (prediabetic range): 6.1 % (ref 5.7–6.4)

## 2018-06-29 LAB — GLUCOSE, POCT (MANUAL RESULT ENTRY): POC Glucose: 83 mg/dl (ref 70–99)

## 2018-06-29 MED ORDER — INSULIN GLARGINE 100 UNIT/ML SOLOSTAR PEN: 8.0000 [IU] | PEN_INJECTOR | SUBCUTANEOUS | 3 refills | Status: DC

## 2018-06-29 MED ORDER — INSULIN PEN NEEDLE 32G X 4 MM MISC: 1 refills | Status: DC

## 2018-06-29 MED FILL — LANTUS SOLOSTAR 100 UNITS/M: 100 | 37 days supply | Qty: 3 | Fill #0

## 2018-06-29 MED FILL — TRUEPLUS PEN NDL 32GX5/32: 32G X 4 MM | 30 days supply | Qty: 100 | Fill #0

## 2018-06-29 MED FILL — TRUEPLUS PEN NDL 32GX5/32": 32G X 4 MM | 30 days supply | Qty: 100 | Fill #0

## 2018-06-29 NOTE — Telephone Encounter (Addendum)
Met with the patient at request of Dr Wynetta Emery. He explained that he has been receiving $139/month SSI and was recently informed by social security that he has been overpaid $3000 and they are going to " take back " the money by with holding his $621/month of social security. He said that this is being done because he did not report that his wife had enrolled in a life insurance plan.  His wife currently works part time. He said that he is in the process of getting his medical records because he wants to apply for SSDI, instead of the SSI that he currently receives. He was agreeable to making a referral to [employer] [place] to inquire if they can assist with the withholding of his SSI.  The referral was faxed to the attention of Abelino Derrick.  He informed this CM that he is almost out of insulin but has enough at home and will order through his mail order pharmacy. He said that he is out of insulin pen needles.  Feliz Beam, [facility] Pharm[facility] Tech transferred the order for pen needles to [facility]. The patient was instructed to wait for the pharmacy to fill the order prior to leaving the clinic and he said that he would wait.  He also informed his CM that he takes his lantus in the morning and does not take it when his blood sugar is between 80-100. This information was shared with Dr Wynetta Emery.

## 2018-06-29 NOTE — Patient Instructions (Signed)

## 2018-06-29 NOTE — Telephone Encounter (Signed)
Contacted pt to go over Dr. Wynetta Emery message didn't answer. Wasn't able to lvm will try pt again tomorrow

## 2018-06-29 NOTE — Progress Notes (Signed)
[id number]
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
## 2018-06-30 LAB — MULTIPLE MYELOMA PANEL, SERUM
Albumin SerPl Elph-Mcnc: 3.5 g/dL (ref 2.9–4.4)
Albumin/Glob SerPl: 0.9 (ref 0.7–1.7)
Alpha 1: 0.2 g/dL (ref 0.0–0.4)
Alpha2 Glob SerPl Elph-Mcnc: 0.9 g/dL (ref 0.4–1.0)
B-Globulin SerPl Elph-Mcnc: 0.9 g/dL (ref 0.7–1.3)
Gamma Glob SerPl Elph-Mcnc: 1.9 g/dL — ABNORMAL HIGH (ref 0.4–1.8)
Globulin, Total: 3.9 g/dL (ref 2.2–3.9)
IgA: 230 mg/dL (ref 61–437)
IgG (Immunoglobin G), Serum: 1901 mg/dL — ABNORMAL HIGH (ref 700–1600)
IgM (Immunoglobulin M), Srm: 309 mg/dL — ABNORMAL HIGH (ref 20–172)
M Protein SerPl Elph-Mcnc: 0.6 g/dL — ABNORMAL HIGH
Total Protein ELP: 7.4 g/dL (ref 6.0–8.5)

## 2018-06-30 NOTE — Telephone Encounter (Signed)
Tried contacting pt again to go over Dr. Wynetta Emery message pt didn't answer and was unable to lvm

## 2018-07-03 ENCOUNTER — Telehealth: Payer: Self-pay

## 2018-07-03 NOTE — Telephone Encounter (Signed)
-----   Message from Truitt Merle, MD sent at 07/01/2018  3:43 PM EDT ----- Please let pt know his lab results, overall his anemia, CKD, MGUS labs are stable, no new concerns, thanks   Truitt Merle  07/01/2018

## 2018-07-03 NOTE — Telephone Encounter (Signed)
Spoke with patient per Dr. Burr Medico notified lab results, overall his anemia, CKD, MGUS labs are stable, no new concerns, patient verbalized an understanding.

## 2018-07-05 DIAGNOSIS — H524 Presbyopia: Secondary | ICD-10-CM | POA: Diagnosis not present

## 2018-07-06 ENCOUNTER — Other Ambulatory Visit: Payer: Self-pay

## 2018-07-06 DIAGNOSIS — N183 Chronic kidney disease, stage 3 unspecified: Secondary | ICD-10-CM

## 2018-07-11 ENCOUNTER — Other Ambulatory Visit (HOSPITAL_COMMUNITY): Payer: Medicare Other

## 2018-07-11 ENCOUNTER — Encounter: Payer: Medicare Other | Admitting: Vascular Surgery

## 2018-07-11 ENCOUNTER — Encounter (HOSPITAL_COMMUNITY): Payer: Medicare Other

## 2018-07-19 ENCOUNTER — Encounter (INDEPENDENT_AMBULATORY_CARE_PROVIDER_SITE_OTHER): Payer: Self-pay | Admitting: Orthopaedic Surgery

## 2018-07-19 ENCOUNTER — Ambulatory Visit (INDEPENDENT_AMBULATORY_CARE_PROVIDER_SITE_OTHER): Payer: Medicare Other

## 2018-07-19 ENCOUNTER — Ambulatory Visit (INDEPENDENT_AMBULATORY_CARE_PROVIDER_SITE_OTHER): Payer: Medicare Other | Admitting: Orthopaedic Surgery

## 2018-07-19 ENCOUNTER — Telehealth: Payer: Self-pay

## 2018-07-19 DIAGNOSIS — S52501D Unspecified fracture of the lower end of right radius, subsequent encounter for closed fracture with routine healing: Secondary | ICD-10-CM | POA: Diagnosis not present
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
## 2018-07-19 NOTE — Telephone Encounter (Signed)
As per Mark [name], [employer] [facility], they have been tying to reach the patient without any success

## 2018-07-26 ENCOUNTER — Other Ambulatory Visit: Payer: Self-pay | Admitting: Internal Medicine

## 2018-07-26 ENCOUNTER — Ambulatory Visit (HOSPITAL_COMMUNITY)
Admission: RE | Admit: 2018-07-26 | Discharge: 2018-07-26 | Disposition: A | Discharge status: Home / Self Care | Payer: Medicare Other | Source: Ambulatory Visit | Attending: Nephrology | Admitting: Nephrology

## 2018-07-26 VITALS — BP 149/76 | HR 69 | Temp 98.2°F | Resp 20

## 2018-07-26 DIAGNOSIS — I5042 Chronic combined systolic (congestive) and diastolic (congestive) heart failure: Secondary | ICD-10-CM

## 2018-07-26 DIAGNOSIS — N183 Chronic kidney disease, stage 3 unspecified: Secondary | ICD-10-CM

## 2018-07-26 DIAGNOSIS — I10 Essential (primary) hypertension: Secondary | ICD-10-CM

## 2018-07-26 LAB — COMPREHENSIVE METABOLIC PANEL
ALT: 14 U/L (ref 0–44)
AST: 12 U/L — ABNORMAL LOW (ref 15–41)
Albumin: 3.2 g/dL — ABNORMAL LOW (ref 3.5–5.0)
Alkaline Phosphatase: 84 U/L (ref 38–126)
Anion gap: 5 (ref 5–15)
BUN: 46 mg/dL — ABNORMAL HIGH (ref 8–23)
CO2: 18 mmol/L — ABNORMAL LOW (ref 22–32)
Calcium: 8.6 mg/dL — ABNORMAL LOW (ref 8.9–10.3)
Chloride: 117 mmol/L — ABNORMAL HIGH (ref 98–111)
Creatinine, Ser: 3.21 mg/dL — ABNORMAL HIGH (ref 0.61–1.24)
GFR calc Af Amer: 22 mL/min — ABNORMAL LOW (ref >60)
GFR calc non Af Amer: 19 mL/min — ABNORMAL LOW (ref >60)
Glucose, Bld: 144 mg/dL — ABNORMAL HIGH (ref 70–99)
Potassium: 4.6 mmol/L (ref 3.5–5.1)
Sodium: 140 mmol/L (ref 135–145)
Total Bilirubin: 0.4 mg/dL (ref 0.3–1.2)
Total Protein: 7 g/dL (ref 6.5–8.1)

## 2018-07-26 LAB — POCT HEMOGLOBIN-HEMACUE: Hemoglobin: 9.9 g/dL — ABNORMAL LOW (ref 13.0–17.0)

## 2018-07-26 LAB — PHOSPHORUS: Phosphorus: 3.8 mg/dL (ref 2.5–4.6)

## 2018-07-26 LAB — IRON AND TIBC
Iron: 57 ug/dL (ref 45–182)
Saturation Ratios: 28 % (ref 17.9–39.5)
TIBC: 207 ug/dL — ABNORMAL LOW (ref 250–450)
UIBC: 150 ug/dL

## 2018-07-26 LAB — FERRITIN: Ferritin: 126 ng/mL (ref 24–336)

## 2018-07-26 MED ORDER — EPOETIN ALFA-EPBX 10000 UNIT/ML IJ SOLN: 5000.0000 [IU] | INTRAMUSCULAR | Status: DC

## 2018-07-26 MED ORDER — EPOETIN ALFA-EPBX 3000 UNIT/ML IJ SOLN
3000.0000 [IU] | Freq: Once | INTRAMUSCULAR | Status: AC
  Administered 2018-07-26: 3000 [IU] via SUBCUTANEOUS
  Filled 2018-07-26: qty 1

## 2018-07-26 MED ORDER — EPOETIN ALFA-EPBX 2000 UNIT/ML IJ SOLN
2000.0000 [IU] | Freq: Once | INTRAMUSCULAR | Status: AC
  Administered 2018-07-26: 2000 [IU] via SUBCUTANEOUS
  Filled 2018-07-26: qty 1

## 2018-08-01 ENCOUNTER — Encounter: Payer: Self-pay | Admitting: Internal Medicine

## 2018-08-01 NOTE — Progress Notes (Signed)
Received note from Dr. Marval Regal.  Pt seen 07/04/2018.  Patient diagnosed with polyclonal gammopathy.  He was seen by Dr. 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  Bone survey was also ordered to rule out lytic lesions.  He does not meet the diagnostic criteria for lymphoplasmacytic lymphoma or [place]'s macroglobulinemia.  He will follow-up with Dr. Morey Hummingbird in a few months.  Pt on Retacrit 5-10,000 units.

## 2018-08-11 ENCOUNTER — Ambulatory Visit (HOSPITAL_COMMUNITY)
Admission: RE | Admit: 2018-08-11 | Discharge: 2018-08-11 | Disposition: A | Discharge status: Home / Self Care | Payer: Medicare Other | Source: Ambulatory Visit | Attending: Vascular Surgery | Admitting: Vascular Surgery

## 2018-08-11 ENCOUNTER — Ambulatory Visit (INDEPENDENT_AMBULATORY_CARE_PROVIDER_SITE_OTHER)
Admission: RE | Admit: 2018-08-11 | Discharge: 2018-08-11 | Disposition: A | Discharge status: Home / Self Care | Payer: Medicare Other | Source: Ambulatory Visit | Attending: Vascular Surgery | Admitting: Vascular Surgery

## 2018-08-11 ENCOUNTER — Other Ambulatory Visit: Payer: Self-pay

## 2018-08-11 ENCOUNTER — Encounter: Payer: Self-pay | Admitting: Vascular Surgery

## 2018-08-11 ENCOUNTER — Ambulatory Visit (INDEPENDENT_AMBULATORY_CARE_PROVIDER_SITE_OTHER): Payer: Medicare Other | Admitting: Vascular Surgery

## 2018-08-11 VITALS — BP 164/89 | HR 72 | Temp 97.2°F | Resp 18 | Ht 76.0 in | Wt 232.0 lb

## 2018-08-11 DIAGNOSIS — N183 Chronic kidney disease, stage 3 unspecified: Secondary | ICD-10-CM

## 2018-08-11 DIAGNOSIS — N185 Chronic kidney disease, stage 5: Secondary | ICD-10-CM | POA: Diagnosis not present
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
## 2018-08-11 NOTE — Progress Notes (Signed)
I spoke with Shaquina at [facility] and she said that Dr. Elissa Hefty note states that patient has stable Stage 4 CKD at this time. She will call me back at the appropriate time to schedule patient for left arm AVG depending on patient's blood work. Doristine Bosworth will contact Mr. Stanek and discuss this plan with him. I will put surgery sheet in Dr. Claretha Cooper pending file.

## 2018-08-15 ENCOUNTER — Encounter: Payer: Self-pay | Admitting: Nephrology

## 2018-08-21 ENCOUNTER — Inpatient Hospital Stay: Payer: Medicare Other | Attending: Hematology

## 2018-08-21 DIAGNOSIS — D638 Anemia in other chronic diseases classified elsewhere: Secondary | ICD-10-CM | POA: Diagnosis not present

## 2018-08-21 DIAGNOSIS — N184 Chronic kidney disease, stage 4 (severe): Secondary | ICD-10-CM | POA: Diagnosis not present

## 2018-08-21 DIAGNOSIS — D472 Monoclonal gammopathy: Secondary | ICD-10-CM

## 2018-08-21 DIAGNOSIS — Z89512 Acquired absence of left leg below knee: Secondary | ICD-10-CM | POA: Diagnosis not present

## 2018-08-21 DIAGNOSIS — E119 Type 2 diabetes mellitus without complications: Secondary | ICD-10-CM | POA: Diagnosis not present

## 2018-08-21 DIAGNOSIS — Z794 Long term (current) use of insulin: Secondary | ICD-10-CM | POA: Diagnosis not present

## 2018-08-21 DIAGNOSIS — Z79899 Other long term (current) drug therapy: Secondary | ICD-10-CM | POA: Insufficient documentation

## 2018-08-21 DIAGNOSIS — Z7982 Long term (current) use of aspirin: Secondary | ICD-10-CM | POA: Diagnosis not present

## 2018-08-21 DIAGNOSIS — I10 Essential (primary) hypertension: Secondary | ICD-10-CM | POA: Insufficient documentation

## 2018-08-21 LAB — CMP (CANCER CENTER ONLY)
ALT: 14 U/L (ref 0–44)
AST: 11 U/L — ABNORMAL LOW (ref 15–41)
Albumin: 3.4 g/dL — ABNORMAL LOW (ref 3.5–5.0)
Alkaline Phosphatase: 98 U/L (ref 38–126)
Anion gap: 7 (ref 5–15)
BUN: 56 mg/dL — ABNORMAL HIGH (ref 8–23)
CO2: 18 mmol/L — ABNORMAL LOW (ref 22–32)
Calcium: 8.8 mg/dL — ABNORMAL LOW (ref 8.9–10.3)
Chloride: 116 mmol/L — ABNORMAL HIGH (ref 98–111)
Creatinine: 3.67 mg/dL (ref 0.61–1.24)
GFR, Est AFR Am: 19 mL/min — ABNORMAL LOW (ref >60)
GFR, Estimated: 16 mL/min — ABNORMAL LOW (ref >60)
Glucose, Bld: 151 mg/dL — ABNORMAL HIGH (ref 70–99)
Potassium: 5.7 mmol/L — ABNORMAL HIGH (ref 3.5–5.1)
Sodium: 141 mmol/L (ref 135–145)
Total Bilirubin: 0.2 mg/dL — ABNORMAL LOW (ref 0.3–1.2)
Total Protein: 7.9 g/dL (ref 6.5–8.1)

## 2018-08-21 LAB — CBC WITH DIFFERENTIAL (CANCER CENTER ONLY)
Abs Immature Granulocytes: 0.01 10*3/uL (ref 0.00–0.07)
Basophils Absolute: 0.1 10*3/uL (ref 0.0–0.1)
Basophils Relative: 1 %
Eosinophils Absolute: 0.5 10*3/uL (ref 0.0–0.5)
Eosinophils Relative: 6 %
HCT: 32.2 % — ABNORMAL LOW (ref 39.0–52.0)
Hemoglobin: 10 g/dL — ABNORMAL LOW (ref 13.0–17.0)
Immature Granulocytes: 0 %
Lymphocytes Relative: 36 %
Lymphs Abs: 2.9 10*3/uL (ref 0.7–4.0)
MCH: 29.5 pg (ref 26.0–34.0)
MCHC: 31.1 g/dL (ref 30.0–36.0)
MCV: 95 fL (ref 80.0–100.0)
Monocytes Absolute: 0.6 10*3/uL (ref 0.1–1.0)
Monocytes Relative: 7 %
Neutro Abs: 4 10*3/uL (ref 1.7–7.7)
Neutrophils Relative %: 50 %
Platelet Count: 340 10*3/uL (ref 150–400)
RBC: 3.39 MIL/uL — ABNORMAL LOW (ref 4.22–5.81)
RDW: 13.8 % (ref 11.5–15.5)
WBC Count: 8.1 10*3/uL (ref 4.0–10.5)
nRBC: 0 % (ref 0.0–0.2)

## 2018-08-22 LAB — KAPPA/LAMBDA LIGHT CHAINS
Kappa free light chain: 173.7 mg/L — ABNORMAL HIGH (ref 3.3–19.4)
Kappa, lambda light chain ratio: 2.86 — ABNORMAL HIGH (ref 0.26–1.65)
Lambda free light chains: 60.7 mg/L — ABNORMAL HIGH (ref 5.7–26.3)

## 2018-08-24 ENCOUNTER — Inpatient Hospital Stay (HOSPITAL_COMMUNITY): Admission: RE | Admit: 2018-08-24 | Payer: Medicare Other | Source: Ambulatory Visit
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
## 2018-08-28 ENCOUNTER — Inpatient Hospital Stay (HOSPITAL_BASED_OUTPATIENT_CLINIC_OR_DEPARTMENT_OTHER): Payer: Medicare Other | Admitting: Hematology

## 2018-08-28 ENCOUNTER — Telehealth: Payer: Self-pay | Admitting: Hematology

## 2018-08-28 VITALS — BP 168/80 | HR 69 | Temp 98.6°F | Resp 20 | Ht 76.0 in | Wt 239.0 lb

## 2018-08-28 DIAGNOSIS — D638 Anemia in other chronic diseases classified elsewhere: Secondary | ICD-10-CM | POA: Diagnosis not present

## 2018-08-28 DIAGNOSIS — Z794 Long term (current) use of insulin: Secondary | ICD-10-CM

## 2018-08-28 DIAGNOSIS — Z89512 Acquired absence of left leg below knee: Secondary | ICD-10-CM

## 2018-08-28 DIAGNOSIS — Z79899 Other long term (current) drug therapy: Secondary | ICD-10-CM

## 2018-08-28 DIAGNOSIS — D472 Monoclonal gammopathy: Secondary | ICD-10-CM

## 2018-08-28 DIAGNOSIS — E119 Type 2 diabetes mellitus without complications: Secondary | ICD-10-CM

## 2018-08-28 DIAGNOSIS — Z7982 Long term (current) use of aspirin: Secondary | ICD-10-CM | POA: Diagnosis not present

## 2018-08-28 DIAGNOSIS — N184 Chronic kidney disease, stage 4 (severe): Secondary | ICD-10-CM | POA: Diagnosis not present

## 2018-08-28 DIAGNOSIS — I10 Essential (primary) hypertension: Secondary | ICD-10-CM

## 2018-08-28 LAB — MULTIPLE MYELOMA PANEL, SERUM
Albumin SerPl Elph-Mcnc: 3.4 g/dL (ref 2.9–4.4)
Albumin/Glob SerPl: 0.9 (ref 0.7–1.7)
Alpha 1: 0.2 g/dL (ref 0.0–0.4)
Alpha2 Glob SerPl Elph-Mcnc: 0.9 g/dL (ref 0.4–1.0)
B-Globulin SerPl Elph-Mcnc: 1 g/dL (ref 0.7–1.3)
Gamma Glob SerPl Elph-Mcnc: 1.8 g/dL (ref 0.4–1.8)
Globulin, Total: 3.9 g/dL (ref 2.2–3.9)
IgA: 228 mg/dL (ref 61–437)
IgG (Immunoglobin G), Serum: 1838 mg/dL — ABNORMAL HIGH (ref 700–1600)
IgM (Immunoglobulin M), Srm: 324 mg/dL — ABNORMAL HIGH (ref 20–172)
M Protein SerPl Elph-Mcnc: 0.4 g/dL — ABNORMAL HIGH
Total Protein ELP: 7.3 g/dL (ref 6.0–8.5)

## 2018-08-28 NOTE — Telephone Encounter (Signed)
Gave patient avs report and appointments for March and June. Patient also given information to contact central radiology to set up bone survey. Patient informed central radiology has been trying to reach him re scheduling appointment.

## 2018-08-29 ENCOUNTER — Encounter: Payer: Self-pay | Admitting: Hematology

## 2018-08-29 ENCOUNTER — Ambulatory Visit (HOSPITAL_COMMUNITY)
Admission: RE | Admit: 2018-08-29 | Discharge: 2018-08-29 | Disposition: A | Discharge status: Home / Self Care | Payer: Medicare Other | Source: Ambulatory Visit | Attending: Nephrology | Admitting: Nephrology

## 2018-08-29 VITALS — BP 113/78 | HR 65 | Temp 98.8°F | Resp 20

## 2018-08-29 DIAGNOSIS — N183 Chronic kidney disease, stage 3 unspecified: Secondary | ICD-10-CM

## 2018-08-29 LAB — POCT HEMOGLOBIN-HEMACUE: Hemoglobin: 9.9 g/dL — ABNORMAL LOW (ref 13.0–17.0)

## 2018-08-29 MED ORDER — EPOETIN ALFA-EPBX 10000 UNIT/ML IJ SOLN
5000.0000 [IU] | INTRAMUSCULAR | Status: DC
  Filled 2018-08-29: qty 1

## 2018-08-29 MED ORDER — EPOETIN ALFA-EPBX 2000 UNIT/ML IJ SOLN
2000.0000 [IU] | Freq: Once | INTRAMUSCULAR | Status: AC
  Administered 2018-08-29: 2000 [IU] via SUBCUTANEOUS
  Filled 2018-08-29: qty 1

## 2018-08-29 MED ORDER — EPOETIN ALFA-EPBX 3000 UNIT/ML IJ SOLN
3000.0000 [IU] | Freq: Once | INTRAMUSCULAR | Status: AC
  Administered 2018-08-29: 3000 [IU] via SUBCUTANEOUS
  Filled 2018-08-29: qty 1

## 2018-08-30 DIAGNOSIS — J189 Pneumonia, unspecified organism: Secondary | ICD-10-CM

## 2018-08-30 HISTORY — DX: Pneumonia, unspecified organism: J18.9

## 2018-09-06 ENCOUNTER — Ambulatory Visit (HOSPITAL_COMMUNITY)
Admission: RE | Admit: 2018-09-06 | Discharge: 2018-09-06 | Disposition: A | Discharge status: Home / Self Care | Payer: Medicare Other | Source: Ambulatory Visit | Attending: Hematology | Admitting: Hematology

## 2018-09-06 DIAGNOSIS — D472 Monoclonal gammopathy: Secondary | ICD-10-CM | POA: Insufficient documentation

## 2018-09-12 ENCOUNTER — Telehealth: Payer: Self-pay

## 2018-09-12 NOTE — Telephone Encounter (Signed)
-----   Message from Truitt Merle, MD sent at 09/10/2018  8:37 PM EST ----- Please let pt know the bone survey result, no concerns, thanks   Truitt Merle  09/10/2018

## 2018-09-12 NOTE — Telephone Encounter (Signed)
Spoke with patient regarding bone survey results.  Per Dr. Burr Medico no concerns, was negative, patient verbalized an understanding.

## 2018-09-15 ENCOUNTER — Other Ambulatory Visit: Payer: Self-pay | Admitting: Internal Medicine

## 2018-09-15 DIAGNOSIS — E118 Type 2 diabetes mellitus with unspecified complications: Secondary | ICD-10-CM

## 2018-09-15 DIAGNOSIS — Z794 Long term (current) use of insulin: Secondary | ICD-10-CM

## 2018-09-15 DIAGNOSIS — I10 Essential (primary) hypertension: Secondary | ICD-10-CM

## 2018-09-26 ENCOUNTER — Encounter (HOSPITAL_COMMUNITY)
Admission: RE | Admit: 2018-09-26 | Discharge: 2018-09-26 | Disposition: A | Discharge status: Home / Self Care | Payer: Medicare Other | Source: Ambulatory Visit | Attending: Nephrology | Admitting: Nephrology

## 2018-09-26 VITALS — BP 140/73 | HR 66 | Temp 98.6°F | Resp 20

## 2018-09-26 DIAGNOSIS — D631 Anemia in chronic kidney disease: Secondary | ICD-10-CM | POA: Diagnosis not present

## 2018-09-26 DIAGNOSIS — N183 Chronic kidney disease, stage 3 unspecified: Secondary | ICD-10-CM

## 2018-09-26 DIAGNOSIS — N184 Chronic kidney disease, stage 4 (severe): Secondary | ICD-10-CM | POA: Diagnosis not present

## 2018-09-26 LAB — PHOSPHORUS: Phosphorus: 3.3 mg/dL (ref 2.5–4.6)

## 2018-09-26 LAB — IRON AND TIBC
Iron: 46 ug/dL (ref 45–182)
Saturation Ratios: 23 % (ref 17.9–39.5)
TIBC: 203 ug/dL — ABNORMAL LOW (ref 250–450)
UIBC: 157 ug/dL

## 2018-09-26 LAB — POCT HEMOGLOBIN-HEMACUE: Hemoglobin: 9 g/dL — ABNORMAL LOW (ref 13.0–17.0)

## 2018-09-26 LAB — COMPREHENSIVE METABOLIC PANEL
ALT: 13 U/L (ref 0–44)
AST: 14 U/L — ABNORMAL LOW (ref 15–41)
Albumin: 3.1 g/dL — ABNORMAL LOW (ref 3.5–5.0)
Alkaline Phosphatase: 74 U/L (ref 38–126)
Anion gap: 7 (ref 5–15)
BUN: 53 mg/dL — ABNORMAL HIGH (ref 8–23)
CO2: 16 mmol/L — ABNORMAL LOW (ref 22–32)
Calcium: 8.6 mg/dL — ABNORMAL LOW (ref 8.9–10.3)
Chloride: 119 mmol/L — ABNORMAL HIGH (ref 98–111)
Creatinine, Ser: 3.69 mg/dL — ABNORMAL HIGH (ref 0.61–1.24)
GFR calc Af Amer: 19 mL/min — ABNORMAL LOW (ref >60)
GFR calc non Af Amer: 16 mL/min — ABNORMAL LOW (ref >60)
Glucose, Bld: 156 mg/dL — ABNORMAL HIGH (ref 70–99)
Potassium: 4.9 mmol/L (ref 3.5–5.1)
Sodium: 142 mmol/L (ref 135–145)
Total Bilirubin: 0.4 mg/dL (ref 0.3–1.2)
Total Protein: 7 g/dL (ref 6.5–8.1)

## 2018-09-26 LAB — FERRITIN: Ferritin: 121 ng/mL (ref 24–336)

## 2018-09-26 MED ORDER — EPOETIN ALFA-EPBX 10000 UNIT/ML IJ SOLN: 5000.0000 [IU] | INTRAMUSCULAR | Status: DC

## 2018-09-26 MED ORDER — EPOETIN ALFA-EPBX 3000 UNIT/ML IJ SOLN
3000.0000 [IU] | Freq: Once | INTRAMUSCULAR | Status: AC
  Administered 2018-09-26: 3000 [IU] via SUBCUTANEOUS
  Filled 2018-09-26: qty 1

## 2018-09-26 MED ORDER — EPOETIN ALFA-EPBX 2000 UNIT/ML IJ SOLN
2000.0000 [IU] | Freq: Once | INTRAMUSCULAR | Status: AC
  Administered 2018-09-26: 2000 [IU] via SUBCUTANEOUS
  Filled 2018-09-26: qty 1

## 2018-10-03 ENCOUNTER — Encounter: Payer: Self-pay | Admitting: Internal Medicine

## 2018-10-03 ENCOUNTER — Ambulatory Visit: Payer: Medicare Other | Attending: Internal Medicine | Admitting: Internal Medicine

## 2018-10-03 VITALS — BP 167/90 | HR 74 | Temp 98.3°F | Resp 16 | Ht 73.0 in | Wt 249.2 lb

## 2018-10-03 DIAGNOSIS — E118 Type 2 diabetes mellitus with unspecified complications: Secondary | ICD-10-CM

## 2018-10-03 DIAGNOSIS — N185 Chronic kidney disease, stage 5: Secondary | ICD-10-CM | POA: Diagnosis not present

## 2018-10-03 DIAGNOSIS — I10 Essential (primary) hypertension: Secondary | ICD-10-CM

## 2018-10-03 DIAGNOSIS — E1169 Type 2 diabetes mellitus with other specified complication: Secondary | ICD-10-CM

## 2018-10-03 DIAGNOSIS — G40909 Epilepsy, unspecified, not intractable, without status epilepticus: Secondary | ICD-10-CM

## 2018-10-03 DIAGNOSIS — Z794 Long term (current) use of insulin: Secondary | ICD-10-CM

## 2018-10-03 DIAGNOSIS — Z89512 Acquired absence of left leg below knee: Secondary | ICD-10-CM

## 2018-10-03 LAB — POCT GLYCOSYLATED HEMOGLOBIN (HGB A1C): HbA1c, POC (controlled diabetic range): 6.9 % (ref 0.0–7.0)

## 2018-10-03 LAB — GLUCOSE, POCT (MANUAL RESULT ENTRY): POC Glucose: 177 mg/dl — AB (ref 70–99)

## 2018-10-03 MED ORDER — INSULIN GLARGINE 100 UNIT/ML SOLOSTAR PEN: 20.0000 [IU] | PEN_INJECTOR | Freq: Every day | SUBCUTANEOUS | 3 refills | Status: DC

## 2018-10-03 MED ORDER — HYDRALAZINE HCL 25 MG PO TABS: 25.0000 mg | ORAL_TABLET | Freq: Two times a day (BID) | ORAL | 3 refills | Status: DC
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
## 2018-10-03 NOTE — Patient Instructions (Signed)
Please eat something to bring up your blood sugar whenever your blood sugar is less than 90.   Increase the blood pressure medication hydralazine to 25 mg twice a day.  I have sent a new prescription to your pharmacy reflecting 20 units of Lantus daily which you state you have been taking.

## 2018-10-16 ENCOUNTER — Other Ambulatory Visit (HOSPITAL_COMMUNITY): Payer: Self-pay

## 2018-10-17 ENCOUNTER — Inpatient Hospital Stay (HOSPITAL_COMMUNITY)
Admission: EM | Admit: 2018-10-17 | Discharge: 2018-10-21 | DRG: 194 | Disposition: A | Discharge status: Home / Self Care | Payer: Medicare Other | Attending: Internal Medicine | Admitting: Internal Medicine

## 2018-10-17 ENCOUNTER — Encounter (HOSPITAL_COMMUNITY): Payer: Self-pay

## 2018-10-17 ENCOUNTER — Emergency Department (HOSPITAL_COMMUNITY): Payer: Medicare Other

## 2018-10-17 ENCOUNTER — Other Ambulatory Visit: Payer: Self-pay

## 2018-10-17 ENCOUNTER — Encounter (HOSPITAL_COMMUNITY)
Admission: RE | Admit: 2018-10-17 | Discharge: 2018-10-17 | Disposition: A | Discharge status: Home / Self Care | Payer: Medicare Other | Source: Ambulatory Visit | Attending: Nephrology | Admitting: Nephrology

## 2018-10-17 DIAGNOSIS — J440 Chronic obstructive pulmonary disease with acute lower respiratory infection: Secondary | ICD-10-CM | POA: Diagnosis not present

## 2018-10-17 DIAGNOSIS — E1151 Type 2 diabetes mellitus with diabetic peripheral angiopathy without gangrene: Secondary | ICD-10-CM | POA: Diagnosis not present

## 2018-10-17 DIAGNOSIS — N179 Acute kidney failure, unspecified: Secondary | ICD-10-CM | POA: Diagnosis present

## 2018-10-17 DIAGNOSIS — I5032 Chronic diastolic (congestive) heart failure: Secondary | ICD-10-CM | POA: Diagnosis not present

## 2018-10-17 DIAGNOSIS — J181 Lobar pneumonia, unspecified organism: Secondary | ICD-10-CM | POA: Diagnosis not present

## 2018-10-17 DIAGNOSIS — D638 Anemia in other chronic diseases classified elsewhere: Secondary | ICD-10-CM | POA: Diagnosis not present

## 2018-10-17 DIAGNOSIS — E861 Hypovolemia: Secondary | ICD-10-CM | POA: Diagnosis present

## 2018-10-17 DIAGNOSIS — Z7982 Long term (current) use of aspirin: Secondary | ICD-10-CM | POA: Diagnosis not present

## 2018-10-17 DIAGNOSIS — I132 Hypertensive heart and chronic kidney disease with heart failure and with stage 5 chronic kidney disease, or end stage renal disease: Secondary | ICD-10-CM | POA: Diagnosis not present

## 2018-10-17 DIAGNOSIS — J159 Unspecified bacterial pneumonia: Secondary | ICD-10-CM | POA: Diagnosis present

## 2018-10-17 DIAGNOSIS — G40909 Epilepsy, unspecified, not intractable, without status epilepticus: Secondary | ICD-10-CM | POA: Diagnosis present

## 2018-10-17 DIAGNOSIS — Z89512 Acquired absence of left leg below knee: Secondary | ICD-10-CM

## 2018-10-17 DIAGNOSIS — N186 End stage renal disease: Secondary | ICD-10-CM

## 2018-10-17 DIAGNOSIS — R0602 Shortness of breath: Secondary | ICD-10-CM | POA: Diagnosis not present

## 2018-10-17 DIAGNOSIS — E1122 Type 2 diabetes mellitus with diabetic chronic kidney disease: Secondary | ICD-10-CM | POA: Diagnosis not present

## 2018-10-17 DIAGNOSIS — E860 Dehydration: Secondary | ICD-10-CM | POA: Diagnosis not present

## 2018-10-17 DIAGNOSIS — E118 Type 2 diabetes mellitus with unspecified complications: Secondary | ICD-10-CM

## 2018-10-17 DIAGNOSIS — J101 Influenza due to other identified influenza virus with other respiratory manifestations: Secondary | ICD-10-CM

## 2018-10-17 DIAGNOSIS — E872 Acidosis: Secondary | ICD-10-CM | POA: Diagnosis not present

## 2018-10-17 DIAGNOSIS — N185 Chronic kidney disease, stage 5: Secondary | ICD-10-CM | POA: Diagnosis present

## 2018-10-17 DIAGNOSIS — J189 Pneumonia, unspecified organism: Secondary | ICD-10-CM

## 2018-10-17 DIAGNOSIS — R0902 Hypoxemia: Secondary | ICD-10-CM | POA: Diagnosis present

## 2018-10-17 DIAGNOSIS — Z9081 Acquired absence of spleen: Secondary | ICD-10-CM

## 2018-10-17 DIAGNOSIS — Z89519 Acquired absence of unspecified leg below knee: Secondary | ICD-10-CM

## 2018-10-17 DIAGNOSIS — I10 Essential (primary) hypertension: Secondary | ICD-10-CM | POA: Diagnosis not present

## 2018-10-17 DIAGNOSIS — J1008 Influenza due to other identified influenza virus with other specified pneumonia: Secondary | ICD-10-CM | POA: Diagnosis not present

## 2018-10-17 DIAGNOSIS — Z794 Long term (current) use of insulin: Secondary | ICD-10-CM

## 2018-10-17 DIAGNOSIS — R531 Weakness: Secondary | ICD-10-CM | POA: Diagnosis not present

## 2018-10-17 DIAGNOSIS — Z79899 Other long term (current) drug therapy: Secondary | ICD-10-CM | POA: Diagnosis not present

## 2018-10-17 DIAGNOSIS — R05 Cough: Secondary | ICD-10-CM | POA: Diagnosis not present

## 2018-10-17 LAB — CBC WITH DIFFERENTIAL/PLATELET
Abs Immature Granulocytes: 0.03 10*3/uL (ref 0.00–0.07)
Basophils Absolute: 0 10*3/uL (ref 0.0–0.1)
Basophils Relative: 1 %
Eosinophils Absolute: 0.2 10*3/uL (ref 0.0–0.5)
Eosinophils Relative: 2 %
HCT: 32.2 % — ABNORMAL LOW (ref 39.0–52.0)
Hemoglobin: 9.7 g/dL — ABNORMAL LOW (ref 13.0–17.0)
Immature Granulocytes: 0 %
Lymphocytes Relative: 38 %
Lymphs Abs: 3.3 10*3/uL (ref 0.7–4.0)
MCH: 29.4 pg (ref 26.0–34.0)
MCHC: 30.1 g/dL (ref 30.0–36.0)
MCV: 97.6 fL (ref 80.0–100.0)
Monocytes Absolute: 1 10*3/uL (ref 0.1–1.0)
Monocytes Relative: 12 %
Neutro Abs: 4.1 10*3/uL (ref 1.7–7.7)
Neutrophils Relative %: 47 %
Platelets: 250 10*3/uL (ref 150–400)
RBC: 3.3 MIL/uL — ABNORMAL LOW (ref 4.22–5.81)
RDW: 13.7 % (ref 11.5–15.5)
WBC: 8.7 10*3/uL (ref 4.0–10.5)
nRBC: 0.3 % — ABNORMAL HIGH (ref 0.0–0.2)

## 2018-10-17 MED ORDER — IPRATROPIUM-ALBUTEROL 0.5-2.5 (3) MG/3ML IN SOLN
RESPIRATORY_TRACT | Status: AC
  Administered 2018-10-17: 3 mL
  Filled 2018-10-17: qty 3

## 2018-10-17 MED ORDER — SODIUM CHLORIDE 0.9 % IV SOLN
500.0000 mg | Freq: Once | INTRAVENOUS | Status: AC
  Administered 2018-10-18: 500 mg via INTRAVENOUS
  Filled 2018-10-17: qty 500

## 2018-10-17 MED ORDER — ALBUTEROL SULFATE HFA 108 (90 BASE) MCG/ACT IN AERS
2.0000 | INHALATION_SPRAY | Freq: Once | RESPIRATORY_TRACT | Status: AC
  Administered 2018-10-17: 2 via RESPIRATORY_TRACT
  Filled 2018-10-17: qty 6.7

## 2018-10-17 MED ORDER — SODIUM CHLORIDE 0.9 % IV SOLN
510.0000 mg | INTRAVENOUS | Status: DC
  Administered 2018-10-17: 510 mg via INTRAVENOUS
  Filled 2018-10-17: qty 510

## 2018-10-17 MED ORDER — IPRATROPIUM-ALBUTEROL 0.5-2.5 (3) MG/3ML IN SOLN
3.0000 mL | Freq: Once | RESPIRATORY_TRACT | Status: AC
  Administered 2018-10-17: 3 mL via RESPIRATORY_TRACT
  Filled 2018-10-17: qty 3

## 2018-10-17 MED ORDER — SODIUM CHLORIDE 0.9 % IV SOLN
1.0000 g | Freq: Once | INTRAVENOUS | Status: AC
  Administered 2018-10-17: 1 g via INTRAVENOUS
  Filled 2018-10-17: qty 10
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
## 2018-10-17 NOTE — ED Triage Notes (Signed)
Pt arrived stating he has had flu like symptoms over the last four days. Pt non febrile. States he has a cough, sore throat and feeling weak.

## 2018-10-17 NOTE — ED Notes (Signed)
RN dropped Duoneb. Will override new Duoneb from pyxis.

## 2018-10-17 NOTE — ED Notes (Signed)
Bed: WA02 Expected date:  Expected time:  Means of arrival:  Comments: 

## 2018-10-17 NOTE — Discharge Instructions (Signed)

## 2018-10-18 DIAGNOSIS — N185 Chronic kidney disease, stage 5: Secondary | ICD-10-CM | POA: Diagnosis not present

## 2018-10-18 DIAGNOSIS — E860 Dehydration: Secondary | ICD-10-CM | POA: Diagnosis present

## 2018-10-18 DIAGNOSIS — Z7982 Long term (current) use of aspirin: Secondary | ICD-10-CM | POA: Diagnosis not present

## 2018-10-18 DIAGNOSIS — I132 Hypertensive heart and chronic kidney disease with heart failure and with stage 5 chronic kidney disease, or end stage renal disease: Secondary | ICD-10-CM | POA: Diagnosis present

## 2018-10-18 DIAGNOSIS — J159 Unspecified bacterial pneumonia: Secondary | ICD-10-CM | POA: Diagnosis present

## 2018-10-18 DIAGNOSIS — J440 Chronic obstructive pulmonary disease with acute lower respiratory infection: Secondary | ICD-10-CM | POA: Diagnosis present

## 2018-10-18 DIAGNOSIS — D638 Anemia in other chronic diseases classified elsewhere: Secondary | ICD-10-CM | POA: Diagnosis present

## 2018-10-18 DIAGNOSIS — Z794 Long term (current) use of insulin: Secondary | ICD-10-CM

## 2018-10-18 DIAGNOSIS — N179 Acute kidney failure, unspecified: Secondary | ICD-10-CM | POA: Diagnosis not present

## 2018-10-18 DIAGNOSIS — E1122 Type 2 diabetes mellitus with diabetic chronic kidney disease: Secondary | ICD-10-CM | POA: Diagnosis not present

## 2018-10-18 DIAGNOSIS — J181 Lobar pneumonia, unspecified organism: Secondary | ICD-10-CM | POA: Diagnosis not present

## 2018-10-18 DIAGNOSIS — J189 Pneumonia, unspecified organism: Secondary | ICD-10-CM | POA: Diagnosis present

## 2018-10-18 DIAGNOSIS — J101 Influenza due to other identified influenza virus with other respiratory manifestations: Secondary | ICD-10-CM | POA: Diagnosis present

## 2018-10-18 DIAGNOSIS — Z89512 Acquired absence of left leg below knee: Secondary | ICD-10-CM | POA: Diagnosis not present

## 2018-10-18 DIAGNOSIS — Z9081 Acquired absence of spleen: Secondary | ICD-10-CM | POA: Diagnosis not present

## 2018-10-18 DIAGNOSIS — E872 Acidosis: Secondary | ICD-10-CM | POA: Diagnosis present

## 2018-10-18 DIAGNOSIS — G40909 Epilepsy, unspecified, not intractable, without status epilepticus: Secondary | ICD-10-CM | POA: Diagnosis present

## 2018-10-18 DIAGNOSIS — E1151 Type 2 diabetes mellitus with diabetic peripheral angiopathy without gangrene: Secondary | ICD-10-CM | POA: Diagnosis present

## 2018-10-18 DIAGNOSIS — R0902 Hypoxemia: Secondary | ICD-10-CM | POA: Diagnosis present

## 2018-10-18 DIAGNOSIS — I5032 Chronic diastolic (congestive) heart failure: Secondary | ICD-10-CM | POA: Diagnosis present

## 2018-10-18 DIAGNOSIS — E118 Type 2 diabetes mellitus with unspecified complications: Secondary | ICD-10-CM | POA: Diagnosis not present

## 2018-10-18 DIAGNOSIS — Z79899 Other long term (current) drug therapy: Secondary | ICD-10-CM | POA: Diagnosis not present

## 2018-10-18 DIAGNOSIS — E861 Hypovolemia: Secondary | ICD-10-CM | POA: Diagnosis present

## 2018-10-18 DIAGNOSIS — J1008 Influenza due to other identified influenza virus with other specified pneumonia: Secondary | ICD-10-CM | POA: Diagnosis present

## 2018-10-18 LAB — GLUCOSE, CAPILLARY
Glucose-Capillary: 120 mg/dL — ABNORMAL HIGH (ref 70–99)
Glucose-Capillary: 107 mg/dL — ABNORMAL HIGH (ref 70–99)
Glucose-Capillary: 138 mg/dL — ABNORMAL HIGH (ref 70–99)

## 2018-10-18 LAB — BASIC METABOLIC PANEL
Anion gap: 8 (ref 5–15)
BUN: 66 mg/dL — ABNORMAL HIGH (ref 8–23)
CO2: 16 mmol/L — ABNORMAL LOW (ref 22–32)
Calcium: 8.4 mg/dL — ABNORMAL LOW (ref 8.9–10.3)
Chloride: 115 mmol/L — ABNORMAL HIGH (ref 98–111)
Creatinine, Ser: 4.86 mg/dL — ABNORMAL HIGH (ref 0.61–1.24)
GFR calc Af Amer: 13 mL/min — ABNORMAL LOW (ref >60)
GFR calc non Af Amer: 11 mL/min — ABNORMAL LOW (ref >60)
Glucose, Bld: 98 mg/dL (ref 70–99)
Potassium: 5.1 mmol/L (ref 3.5–5.1)
Sodium: 139 mmol/L (ref 135–145)

## 2018-10-18 LAB — INFLUENZA PANEL BY PCR (TYPE A & B)
Influenza A By PCR: POSITIVE — AB
Influenza B By PCR: NEGATIVE

## 2018-10-18 MED ORDER — SODIUM BICARBONATE 650 MG PO TABS
650.0000 mg | ORAL_TABLET | Freq: Three times a day (TID) | ORAL | Status: DC
  Administered 2018-10-18 – 2018-10-21 (×10): 650 mg via ORAL
  Filled 2018-10-18 (×10): qty 1

## 2018-10-18 MED ORDER — ONDANSETRON HCL 4 MG/2ML IJ SOLN
4.0000 mg | Freq: Four times a day (QID) | INTRAMUSCULAR | Status: DC | PRN
  Administered 2018-10-20: 4 mg via INTRAVENOUS
  Filled 2018-10-18: qty 2

## 2018-10-18 MED ORDER — FERROUS GLUCONATE 324 (38 FE) MG PO TABS
324.0000 mg | ORAL_TABLET | Freq: Two times a day (BID) | ORAL | Status: DC
  Administered 2018-10-18 – 2018-10-21 (×6): 324 mg via ORAL
  Filled 2018-10-18 (×7): qty 1

## 2018-10-18 MED ORDER — ATORVASTATIN CALCIUM 20 MG PO TABS
20.0000 mg | ORAL_TABLET | Freq: Every day | ORAL | Status: DC
  Administered 2018-10-18 – 2018-10-21 (×4): 20 mg via ORAL
  Filled 2018-10-18 (×4): qty 1

## 2018-10-18 MED ORDER — AZITHROMYCIN 250 MG PO TABS
500.0000 mg | ORAL_TABLET | ORAL | Status: DC
  Administered 2018-10-18 – 2018-10-19 (×2): 500 mg via ORAL
  Filled 2018-10-18 (×2): qty 2

## 2018-10-18 MED ORDER — INSULIN ASPART 100 UNIT/ML ~~LOC~~ SOLN
0.0000 [IU] | Freq: Three times a day (TID) | SUBCUTANEOUS | Status: DC
  Administered 2018-10-19: 2 [IU] via SUBCUTANEOUS
  Administered 2018-10-20 – 2018-10-21 (×2): 1 [IU] via SUBCUTANEOUS

## 2018-10-18 MED ORDER — ALBUTEROL SULFATE (2.5 MG/3ML) 0.083% IN NEBU: 2.5000 mg | INHALATION_SOLUTION | RESPIRATORY_TRACT | Status: DC | PRN

## 2018-10-18 MED ORDER — OSELTAMIVIR PHOSPHATE 30 MG PO CAPS
30.0000 mg | ORAL_CAPSULE | Freq: Every day | ORAL | Status: DC
  Administered 2018-10-19 – 2018-10-21 (×3): 30 mg via ORAL
  Filled 2018-10-18 (×3): qty 1

## 2018-10-18 MED ORDER — OSELTAMIVIR PHOSPHATE 30 MG PO CAPS
30.0000 mg | ORAL_CAPSULE | Freq: Once | ORAL | Status: AC
  Administered 2018-10-18: 30 mg via ORAL
  Filled 2018-10-18: qty 1

## 2018-10-18 MED ORDER — SODIUM CHLORIDE 0.45 % IV SOLN
INTRAVENOUS | Status: AC
  Administered 2018-10-18: 12:00:00 via INTRAVENOUS

## 2018-10-18 MED ORDER — HYDRALAZINE HCL 25 MG PO TABS
25.0000 mg | ORAL_TABLET | Freq: Two times a day (BID) | ORAL | Status: DC
  Administered 2018-10-18 – 2018-10-21 (×7): 25 mg via ORAL
  Filled 2018-10-18 (×8): qty 1

## 2018-10-18 MED ORDER — ADULT MULTIVITAMIN W/MINERALS CH
1.0000 | ORAL_TABLET | Freq: Every day | ORAL | Status: DC
  Administered 2018-10-18 – 2018-10-21 (×4): 1 via ORAL
  Filled 2018-10-18 (×4): qty 1

## 2018-10-18 MED ORDER — ASPIRIN EC 81 MG PO TBEC
81.0000 mg | DELAYED_RELEASE_TABLET | Freq: Every day | ORAL | Status: DC
  Administered 2018-10-18 – 2018-10-21 (×4): 81 mg via ORAL
  Filled 2018-10-18 (×4): qty 1

## 2018-10-18 MED ORDER — AMLODIPINE BESYLATE 5 MG PO TABS
10.0000 mg | ORAL_TABLET | Freq: Every day | ORAL | Status: DC
  Administered 2018-10-18 – 2018-10-21 (×4): 10 mg via ORAL
  Filled 2018-10-18 (×4): qty 2

## 2018-10-18 MED ORDER — ONDANSETRON HCL 4 MG PO TABS: 4.0000 mg | ORAL_TABLET | Freq: Four times a day (QID) | ORAL | Status: DC | PRN

## 2018-10-18 MED ORDER — CARVEDILOL 12.5 MG PO TABS
12.5000 mg | ORAL_TABLET | Freq: Two times a day (BID) | ORAL | Status: DC
  Administered 2018-10-18 – 2018-10-21 (×7): 12.5 mg via ORAL
  Filled 2018-10-18 (×7): qty 1

## 2018-10-18 MED ORDER — LACOSAMIDE 50 MG PO TABS
200.0000 mg | ORAL_TABLET | Freq: Two times a day (BID) | ORAL | Status: DC
  Administered 2018-10-18 – 2018-10-21 (×6): 200 mg via ORAL
  Filled 2018-10-18 (×6): qty 4

## 2018-10-18 MED ORDER — SODIUM CHLORIDE 0.9 % IV SOLN
2.0000 g | INTRAVENOUS | Status: DC
  Administered 2018-10-18 – 2018-10-19 (×2): 2 g via INTRAVENOUS
  Filled 2018-10-18 (×2): qty 2

## 2018-10-18 MED ORDER — ACETAMINOPHEN 325 MG PO TABS
650.0000 mg | ORAL_TABLET | Freq: Four times a day (QID) | ORAL | Status: DC | PRN
  Administered 2018-10-18: 650 mg via ORAL
  Filled 2018-10-18: qty 2

## 2018-10-18 MED ORDER — HEPARIN SODIUM (PORCINE) 5000 UNIT/ML IJ SOLN
5000.0000 [IU] | Freq: Three times a day (TID) | INTRAMUSCULAR | Status: DC
  Administered 2018-10-18 – 2018-10-21 (×8): 5000 [IU] via SUBCUTANEOUS
  Filled 2018-10-18 (×7): qty 1

## 2018-10-18 MED ORDER — INSULIN GLARGINE 100 UNIT/ML ~~LOC~~ SOLN
10.0000 [IU] | Freq: Every day | SUBCUTANEOUS | Status: DC
  Administered 2018-10-18 – 2018-10-20 (×3): 10 [IU] via SUBCUTANEOUS
  Filled 2018-10-18 (×4): qty 0.1

## 2018-10-18 MED ORDER — ACETAMINOPHEN 650 MG RE SUPP: 650.0000 mg | Freq: Four times a day (QID) | RECTAL | Status: DC | PRN

## 2018-10-18 NOTE — ED Notes (Signed)
ED TO INPATIENT HANDOFF REPORT  Name/Age/Gender Mark [name] 68 y.o. male  Code Status Code Status History    Date Active Date Inactive Code Status Order ID Comments User Context   08/31/2017 1608 09/14/2017 1712 Full Code [id number]  Barton Dubois, MD ED   12/31/2016 2026 01/05/2017 1841 Full Code [id number]  Etta Quill, DO ED   07/14/2016 2007 07/15/2016 1941 Full Code [id number]  Kinsinger, Arta Bruce, MD Inpatient   01/01/2016 2119 01/08/2016 2030 Full Code [id number]  Ivor Costa, MD ED   04/18/2015 1816 04/19/2015 2105 Full Code [id number]  Donne Hazel, MD ED   04/16/2015 2150 04/18/2015 0216 Full Code [id number]  Lavina Hamman, MD Inpatient   10/16/2014 2155 10/18/2014 1650 Full Code [id number]  Toy Baker, MD Inpatient   11/21/2013 1418 11/23/2013 1744 Full Code [id number]  Newt Minion, MD Inpatient   11/06/2013 1602 11/08/2013 1748 Full Code [id number]  Mcarthur Rossetti, MD Inpatient   10/09/2013 1632 10/17/2013 1850 Full Code [id number]  Cathlyn Parsons, PA-C Inpatient   09/26/2013 2252 10/09/2013 1632 Full Code [id number]  Kelvin Cellar, MD Inpatient      Home/SNF/Other Home  Chief Complaint flu like symptoms, cant swallow  Level of Care/Admitting Diagnosis ED Disposition    ED Disposition Condition [place]: [facility] [[id number]]  Level of Care: Med-Surg [16]  Diagnosis: CAP (community acquired pneumonia) [811031]  Admitting Physician: Bonnielee Haff [[id number]]  Attending Physician: Bonnielee Haff [[id number]]  Estimated length of stay: past midnight tomorrow  Certification:: I certify this patient will need inpatient services for at least 2 midnights  PT Class (Do Not Modify): Inpatient [101]  PT Acc Code (Do Not Modify): Private [1]       Medical History Past Medical History:  Diagnosis Date  . Acid indigestion   . Acute encephalopathy 01/01/2016  . Acute renal failure superimposed on stage 3 chronic kidney disease  ([place]) 04/16/2015  . Anemia 10/01/2013  . CHF (congestive heart failure) ([place])   . Chronic kidney disease   . CKD (chronic kidney disease) stage 3, GFR 30-59 ml/min (HCC) 08/18/2015  . Diabetes mellitus, type 2 ([place]) 04/16/2015  . Diverticulitis   . DM (diabetes mellitus), type 2 with peripheral vascular complications ([place])   . Elevated troponin 10/16/2014  . Essential hypertension 04/08/2014  . History of Clostridium difficile colitis 01/01/2016  . Hypertension    no pcp  . Hypothermia 01/01/2016  . Malnutrition of moderate degree ([place]) 04/17/2015  . Onychomycosis of toenail 04/30/2015  . Phantom limb pain ([place]) 12/12/2013  . Positive for microalbuminuria 08/18/2015  . S/P BKA (below knee amputation) ([place]) 11/21/2013   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    . Seizures ([place])   . Spleen absent   . Substance abuse ([place]) 04/02/2016   Cocaine  . Wound infection 01/02/2016    Allergies No Known Allergies  IV Location/Drains/Wounds Patient Lines/Drains/Airways Status   Active Line/Drains/Airways    Name:   Placement date:   Placement time:   Site:   Days:   Peripheral IV 10/18/18 Right Antecubital   10/18/18    0650    Antecubital   less than 1   Negative Pressure Wound Therapy Foot Right   09/08/17    2026    -   405   Incision (Closed) 01/01/17 Leg   01/01/17    1748     655   Incision (Closed) 09/02/17 Foot Right  09/02/17    1854     411   Incision (Closed) 09/08/17 Foot Right   09/08/17    2025     405   Wound / Incision (Open or Dehisced) 01/01/17 Non-pressure wound Leg Anterior;Right open wound /ulcer 2.5 cm x 2.5 cm   01/01/17    0400    Leg   655          Labs/Imaging Results for orders placed or performed during the hospital encounter of 10/17/18 (from the past 48 hour(s))  CBC with Differential     Status: Abnormal   Collection Time: 10/17/18 11:36 PM  Result Value Ref Range   WBC 8.7 4.0 - 10.5 K/uL   RBC 3.30 (L) 4.22 - 5.81 MIL/uL   Hemoglobin 9.7 (L) 13.0 - 17.0 g/dL   HCT 32.2  (L) 39.0 - 52.0 %   MCV 97.6 80.0 - 100.0 fL   MCH 29.4 26.0 - 34.0 pg   MCHC 30.1 30.0 - 36.0 g/dL   RDW 13.7 11.5 - 15.5 %   Platelets 250 150 - 400 K/uL   nRBC 0.3 (H) 0.0 - 0.2 %   Neutrophils Relative % 47 %   Neutro Abs 4.1 1.7 - 7.7 K/uL   Lymphocytes Relative 38 %   Lymphs Abs 3.3 0.7 - 4.0 K/uL   Monocytes Relative 12 %   Monocytes Absolute 1.0 0.1 - 1.0 K/uL   Eosinophils Relative 2 %   Eosinophils Absolute 0.2 0.0 - 0.5 K/uL   Basophils Relative 1 %   Basophils Absolute 0.0 0.0 - 0.1 K/uL   Immature Granulocytes 0 %   Abs Immature Granulocytes 0.03 0.00 - 0.07 K/uL    Comment: Performed at [facility], [place] [address], [place], [place] [address]  Basic metabolic panel     Status: Abnormal   Collection Time: 10/17/18 11:36 PM  Result Value Ref Range   Sodium 139 135 - 145 mmol/L   Potassium 5.1 3.5 - 5.1 mmol/L   Chloride 115 (H) 98 - 111 mmol/L   CO2 16 (L) 22 - 32 mmol/L   Glucose, Bld 98 70 - 99 mg/dL   BUN 66 (H) 8 - 23 mg/dL   Creatinine, Ser 4.86 (H) 0.61 - 1.24 mg/dL   Calcium 8.4 (L) 8.9 - 10.3 mg/dL   GFR calc non Af Amer 11 (L) >60 mL/min   GFR calc Af Amer 13 (L) >60 mL/min   Anion gap 8 5 - 15    Comment: Performed at [facility], [place] [address], [place], [place] [address]  Influenza panel by PCR (type A & B)     Status: Abnormal   Collection Time: 10/18/18  1:04 AM  Result Value Ref Range   Influenza A By PCR POSITIVE (A) NEGATIVE   Influenza B By PCR NEGATIVE NEGATIVE    Comment: (NOTE) The Xpert Xpress Flu assay is intended as an aid in the diagnosis of  influenza and should not be used as a sole basis for treatment.  This  assay is FDA approved for nasopharyngeal swab specimens only. Nasal  washings and aspirates are unacceptable for Xpert Xpress Flu testing. Performed at [facility], [place] [address], [place], [place] [address]    Dg Chest 2 View  Result Date: 10/17/2018 CLINICAL  DATA:  68 year old male with cough and chest congestion for 4 days. Monoclonal gammopathy. EXAM: CHEST - 2 VIEW COMPARISON:  Chest radiographs 08/31/2017 and earlier. FINDINGS:  Streaky peribronchial opacity at the left lung base. No pleural effusion. Elsewhere lung parenchyma appears stable and clear. Stable cardiac size and mediastinal contours. Visualized tracheal air column is within normal limits. No acute osseous abnormality identified. Negative visible bowel gas pattern. IMPRESSION: Acute left lung base opacity compatible with Bronchopneumonia. No pleural effusion. Followup PA and lateral chest X-ray is recommended in 3-4 weeks following trial of antibiotic therapy to ensure resolution and exclude underlying malignancy. Electronically Signed   By: Genevie Ann M.D.   On: 10/17/2018 23:04   None  Pending Labs Unresulted Labs (From admission, onward)   None      Vitals/Pain Today's Vitals   10/18/18 0430 10/18/18 0500 10/18/18 0530 10/18/18 0757  BP: 135/61 (!) 161/84 (!) 168/89 (!) 152/82  Pulse: 60 (!) 56 (!) 59 60  Resp: 16 12 10 18   Temp:      TempSrc:      SpO2: 97% 97% 96% 100%  Weight:      Height:      PainSc:    7     Isolation Precautions Droplet precaution  Medications Medications  ipratropium-albuterol (DUONEB) 0.5-2.5 (3) MG/3ML nebulizer solution 3 mL (3 mLs Nebulization Given 10/17/18 2257)  albuterol (PROVENTIL HFA;VENTOLIN HFA) 108 (90 Base) MCG/ACT inhaler 2 puff (2 puffs Inhalation Given 10/17/18 2255)  ipratropium-albuterol (DUONEB) 0.5-2.5 (3) MG/3ML nebulizer solution (3 mLs  Given 10/17/18 2314)  cefTRIAXone (ROCEPHIN) 1 g in sodium chloride 0.9 % 100 mL IVPB (0 g Intravenous Stopped 10/18/18 0020)  azithromycin (ZITHROMAX) 500 mg in sodium chloride 0.9 % 250 mL IVPB (0 mg Intravenous Stopped 10/18/18 0209)  oseltamivir (TAMIFLU) capsule 30 mg (30 mg Oral Given 10/18/18 0754)    Mobility walks with device
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
## 2018-10-18 NOTE — ED Notes (Signed)
Pt ambulated 25 feet. SpO2 went as low as 91%. Pt assisted back into the bed.

## 2018-10-18 NOTE — Progress Notes (Signed)
PHARMACY NOTE:  ANTIMICROBIAL RENAL DOSAGE ADJUSTMENT  Current antimicrobial regimen includes a mismatch between antimicrobial dosage and estimated renal function.  As per policy approved by the Pharmacy & Therapeutics and Medical Executive Committees, the antimicrobial dosage will be adjusted accordingly.  Current antimicrobial dosage:  Ceftriaxone 1g IV q24h Indication: CAP  Renal Function: Estimated Creatinine Clearance: 19.4 mL/min (A) (by C-G formula based on SCr of 4.86 mg/dL (H)).    Additional comments:  Weight > 100 kg Antimicrobial dosage has been changed to:  Ceftriaxone 2g IV q24h.   Thank you for allowing pharmacy to be a part of this patient's care. Gretta Arab PharmD, BCPS Pager [phone] 10/18/2018 11:20 AM

## 2018-10-18 NOTE — ED Notes (Addendum)
[name], PA spoke with pt. Pt decided he would no longer like to be AMA and would like to be admitted. Given soup and crackers by PA.

## 2018-10-18 NOTE — ED Notes (Signed)
Pt expressing more frustration with staff and delayed admission times stating "this is ridiculous. yall arent doing anything for me here. I am leaving." Pt informed of risks of leaving and encouraged to stay. Pt adamant about leaving. Alcario Drought, MD and [place], [place] aware. IV removed and monitoring equipment removed. Pt signed AMA. Pt alert and ambulatory

## 2018-10-18 NOTE — ED Notes (Addendum)
Pt updated on plan of care. Pt expressed frustration due to wait times.

## 2018-10-18 NOTE — ED Notes (Signed)
IV reestablished and pt stated he was willing to stay

## 2018-10-19 DIAGNOSIS — I10 Essential (primary) hypertension: Secondary | ICD-10-CM

## 2018-10-19 LAB — BASIC METABOLIC PANEL
Anion gap: 8 (ref 5–15)
BUN: 59 mg/dL — ABNORMAL HIGH (ref 8–23)
CO2: 16 mmol/L — ABNORMAL LOW (ref 22–32)
Calcium: 8.1 mg/dL — ABNORMAL LOW (ref 8.9–10.3)
Chloride: 118 mmol/L — ABNORMAL HIGH (ref 98–111)
Creatinine, Ser: 4.48 mg/dL — ABNORMAL HIGH (ref 0.61–1.24)
GFR calc Af Amer: 15 mL/min — ABNORMAL LOW (ref >60)
GFR calc non Af Amer: 13 mL/min — ABNORMAL LOW (ref >60)
Glucose, Bld: 136 mg/dL — ABNORMAL HIGH (ref 70–99)
Potassium: 4.4 mmol/L (ref 3.5–5.1)
Sodium: 142 mmol/L (ref 135–145)

## 2018-10-19 LAB — URINALYSIS, ROUTINE W REFLEX MICROSCOPIC
Bilirubin Urine: NEGATIVE
Glucose, UA: 50 mg/dL — AB
Hgb urine dipstick: NEGATIVE
Ketones, ur: NEGATIVE mg/dL
Leukocytes,Ua: NEGATIVE
Nitrite: NEGATIVE
Protein, ur: 100 mg/dL — AB
Specific Gravity, Urine: 1.011 (ref 1.005–1.030)
pH: 5 (ref 5.0–8.0)

## 2018-10-19 LAB — STREP PNEUMONIAE URINARY ANTIGEN: Strep Pneumo Urinary Antigen: NEGATIVE

## 2018-10-19 LAB — GLUCOSE, CAPILLARY
Glucose-Capillary: 96 mg/dL (ref 70–99)
Glucose-Capillary: 120 mg/dL — ABNORMAL HIGH (ref 70–99)
Glucose-Capillary: 151 mg/dL — ABNORMAL HIGH (ref 70–99)
Glucose-Capillary: 78 mg/dL (ref 70–99)

## 2018-10-19 LAB — CBC
HCT: 31.4 % — ABNORMAL LOW (ref 39.0–52.0)
Hemoglobin: 9.4 g/dL — ABNORMAL LOW (ref 13.0–17.0)
MCH: 29.7 pg (ref 26.0–34.0)
MCHC: 29.9 g/dL — ABNORMAL LOW (ref 30.0–36.0)
MCV: 99.1 fL (ref 80.0–100.0)
Platelets: 239 10*3/uL (ref 150–400)
RBC: 3.17 MIL/uL — ABNORMAL LOW (ref 4.22–5.81)
RDW: 13.6 % (ref 11.5–15.5)
WBC: 5.4 10*3/uL (ref 4.0–10.5)
nRBC: 0.6 % — ABNORMAL HIGH (ref 0.0–0.2)

## 2018-10-19 LAB — CREATININE, URINE, RANDOM: Creatinine, Urine: 82.65 mg/dL

## 2018-10-19 LAB — SODIUM, URINE, RANDOM: Sodium, Ur: 89 mmol/L

## 2018-10-19 LAB — HIV ANTIBODY (ROUTINE TESTING W REFLEX): HIV Screen 4th Generation wRfx: NONREACTIVE

## 2018-10-19 MED ORDER — SODIUM CHLORIDE 0.9 % IV SOLN: INTRAVENOUS | Status: DC

## 2018-10-19 MED ORDER — FLUTICASONE PROPIONATE 50 MCG/ACT NA SUSP
2.0000 | Freq: Every day | NASAL | Status: DC
  Administered 2018-10-19 – 2018-10-21 (×3): 2 via NASAL
  Filled 2018-10-19: qty 16

## 2018-10-19 MED ORDER — LORATADINE 10 MG PO TABS
10.0000 mg | ORAL_TABLET | Freq: Every day | ORAL | Status: DC
  Administered 2018-10-19 – 2018-10-21 (×3): 10 mg via ORAL
  Filled 2018-10-19 (×3): qty 1

## 2018-10-19 MED ORDER — GUAIFENESIN ER 600 MG PO TB12
1200.0000 mg | ORAL_TABLET | Freq: Two times a day (BID) | ORAL | Status: DC
  Administered 2018-10-19 – 2018-10-21 (×5): 1200 mg via ORAL
  Filled 2018-10-19 (×5): qty 2

## 2018-10-19 MED ORDER — IPRATROPIUM-ALBUTEROL 0.5-2.5 (3) MG/3ML IN SOLN
3.0000 mL | Freq: Three times a day (TID) | RESPIRATORY_TRACT | Status: DC
  Administered 2018-10-19: 3 mL via RESPIRATORY_TRACT

## 2018-10-19 MED ORDER — IPRATROPIUM-ALBUTEROL 0.5-2.5 (3) MG/3ML IN SOLN
3.0000 mL | Freq: Two times a day (BID) | RESPIRATORY_TRACT | Status: DC
  Administered 2018-10-19: 3 mL via RESPIRATORY_TRACT
  Filled 2018-10-19 (×3): qty 3

## 2018-10-19 MED ORDER — FAMOTIDINE 20 MG PO TABS
20.0000 mg | ORAL_TABLET | Freq: Every day | ORAL | Status: DC
  Administered 2018-10-19 – 2018-10-21 (×3): 20 mg via ORAL
  Filled 2018-10-19 (×4): qty 1

## 2018-10-19 MED ORDER — SODIUM CHLORIDE 0.9 % IV SOLN
INTRAVENOUS | Status: DC
  Administered 2018-10-19: 21:00:00 via INTRAVENOUS

## 2018-10-19 NOTE — Plan of Care (Signed)
Plan of care discussed.   

## 2018-10-19 NOTE — Progress Notes (Addendum)
PROGRESS NOTE    Mark [name]  SRP:[id number] DOB: 11-04-1950 DOA: 10/17/2018 PCP: [name], MD    Brief Narrative:  Patient is a 68 year old pleasant gentleman history of chronic kidney disease stage V, COPD, hypertension, history of insulin-dependent diabetes mellitus, seizure disorder presenting to the ED with cough productive of greenish sputum, shortness of breath with exertion, sick contacts.  Work-up in the ED consistent with a left lower lobe pneumonia and influenza A.  Patient noted to be hypoxic on ambulation and subsequently admitted for further evaluation and management.  Patient placed empirically on antibiotics and Tamiflu.   Assessment & Plan:   Principal Problem:   CAP (community acquired pneumonia) Active Problems:   S/P BKA (below knee amputation) ([place])   Essential hypertension   Diabetes mellitus, type 2 ([place])   AKI (acute kidney injury) ([place])   Chronic kidney disease, stage V ([place])   Influenza A  1 community-acquired pneumonia in the setting of influenza A Patient admitted with worsening shortness of breath, productive cough, probable fever.  Chest x-ray done consistent with a left lower lobe pneumonia.  Influenza PCR was positive for influenza A.  Check a urine Legionella antigen.  Urine pneumococcus antigen negative.  Patient improving clinically.  Continue Tamiflu, azithromycin, IV Rocephin.  Place on Flonase, Mucinex, scheduled duo nebs, Claritin.  2.  Acute renal failure on chronic kidney disease stage V with normal anion gap metabolic acidosis Likely secondary to a prerenal azotemia due to poor oral intake and dehydration.  Creatinine on admission was 4.86.  Baseline usually around 3.5-3.6.  BUN also noted to be elevated.  Patient placed on gentle hydration and creatinine trending down and currently at 4.48.  Avoid nephrotoxic agents.  Urine output of 1.050 L over the past 24 hours.  Check a UA with cultures and sensitivities.  Check urine sodium,  urine creatinine.  Continue gentle hydration.  Increase bicarb tablets to 3 times daily.  Continue to hold diuretics.   3.  History of COPD No wheezing noted on examination.  Patient not on any inhalers at home.  Follow.  4.  Status post left BKA Stable.  5.  Anemia of chronic disease Patient with no overt bleeding.  Hemoglobin stable at 9.4.  Continue oral iron supplementation.  Outpatient follow-up.  6.  Type 2 diabetes mellitus Hemoglobin A1c was 6.2 on 12/22/2017.  CBG 96 this morning.  Lantus and sliding scale insulin.  7.  Seizure disorder Stable.  Continue Vimpat.  Follow.  8.  Hypertension/chronic diastolic CHF Patient currently euvolemic on admission.  Patient mildly hypovolemic.  Diuretics on hold.  2D echo from 2017 with normal systolic function, grade 1 diastolic dysfunction.  Continue current regimen of Norvasc, aspirin, Lipitor, Coreg, hydralazine.   DVT prophylaxis: Heparin Code Status: Full Family Communication: Updated patient.  No family at bedside. Disposition Plan: Likely home when clinically improved and no longer hypoxic on ambulation hopefully in the next 24 to 48 hours.   Consultants:   None  Procedures:   Chest x-ray 10/17/2018    Antimicrobials:   Oral azithromycin 10/18/2018>>>>> 10/25/2018  IV azithromycin 218 20201 dose  IV Rocephin 10/17/2018  Tamiflu 10/18/2018   Subjective: Patient sitting up in bed eating lunch.  States shortness of breath improving since admission.  Denies any chest pain.  States has good urine output.  Objective: Vitals:   10/18/18 2222 10/19/18 0604 10/19/18 1040 10/19/18 1100  BP: 138/73 (!) 149/83    Pulse: 65 (!) 56  60  Resp: 20 16    Temp: 99.3 F (37.4 C) 98.2 F (36.8 C)    TempSrc: Oral Oral    SpO2: 95% 95% 96%   Weight:      Height:        Intake/Output Summary (Last 24 hours) at 10/19/2018 1325 Last data filed at 10/19/2018 1100 Gross per 24 hour  Intake 1896.25 ml  Output 950 ml  Net  946.25 ml   Filed Weights   10/17/18 2102  Weight: 113 kg    Examination:  General exam: Appears calm and comfortable  Respiratory system: Decreased breath sounds in the left base with some coarse breath sounds on the left.  No crackles, no wheezing, normal respiratory effort.  No use of accessory muscles of respiration. Cardiovascular system: S1 & S2 heard, RRR. No JVD, murmurs, rubs, gallops or clicks. No pedal edema. Gastrointestinal system: Abdomen is nondistended, soft and nontender. No organomegaly or masses felt. Normal bowel sounds heard. Central nervous system: Alert and oriented. No focal neurological deficits. Extremities: Symmetric 5 x 5 power. Skin: No rashes, lesions or ulcers Psychiatry: Judgement and insight appear normal. Mood & affect appropriate.     Data Reviewed: I have personally reviewed following labs and imaging studies  CBC: Recent Labs  Lab 10/17/18 2336 10/19/18 0342  WBC 8.7 5.4  NEUTROABS 4.1  --   HGB 9.7* 9.4*  HCT 32.2* 31.4*  MCV 97.6 99.1  PLT 250 063   Basic Metabolic Panel: Recent Labs  Lab 10/17/18 2336 10/19/18 0342  NA 139 142  K 5.1 4.4  CL 115* 118*  CO2 16* 16*  GLUCOSE 98 136*  BUN 66* 59*  CREATININE 4.86* 4.48*  CALCIUM 8.4* 8.1*   GFR: Estimated Creatinine Clearance: 21.1 mL/min (A) (by C-G formula based on SCr of 4.48 mg/dL (H)). Liver Function Tests: No results for input(s): AST, ALT, ALKPHOS, BILITOT, PROT, ALBUMIN in the last 168 hours. No results for input(s): LIPASE, AMYLASE in the last 168 hours. No results for input(s): AMMONIA in the last 168 hours. Coagulation Profile: No results for input(s): INR, PROTIME in the last 168 hours. Cardiac Enzymes: No results for input(s): CKTOTAL, CKMB, CKMBINDEX, TROPONINI in the last 168 hours. BNP (last 3 results) No results for input(s): PROBNP in the last 8760 hours. HbA1C: No results for input(s): HGBA1C in the last 72 hours. CBG: Recent Labs  Lab  10/18/18 1119 10/18/18 1655 10/18/18 2217 10/19/18 0732 10/19/18 1134  GLUCAP 120* 107* 138* 96 120*   Lipid Profile: No results for input(s): CHOL, HDL, LDLCALC, TRIG, CHOLHDL, LDLDIRECT in the last 72 hours. Thyroid Function Tests: No results for input(s): TSH, T4TOTAL, FREET4, T3FREE, THYROIDAB in the last 72 hours. Anemia Panel: No results for input(s): VITAMINB12, FOLATE, FERRITIN, TIBC, IRON, RETICCTPCT in the last 72 hours. Sepsis Labs: No results for input(s): PROCALCITON, LATICACIDVEN in the last 168 hours.  No results found for this or any previous visit (from the past 240 hour(s)).       Radiology Studies: Dg Chest 2 View  Result Date: 10/17/2018 CLINICAL DATA:  68 year old male with cough and chest congestion for 4 days. Monoclonal gammopathy. EXAM: CHEST - 2 VIEW COMPARISON:  Chest radiographs 08/31/2017 and earlier. FINDINGS: Streaky peribronchial opacity at the left lung base. No pleural effusion. Elsewhere lung parenchyma appears stable and clear. Stable cardiac size and mediastinal contours. Visualized tracheal air column is within normal limits. No acute osseous abnormality identified. Negative visible bowel gas pattern. IMPRESSION: Acute left lung base  opacity compatible with Bronchopneumonia. No pleural effusion. Followup PA and lateral chest X-ray is recommended in 3-4 weeks following trial of antibiotic therapy to ensure resolution and exclude underlying malignancy. Electronically Signed   By: Genevie Ann M.D.   On: 10/17/2018 23:04        Scheduled Meds: . amLODipine  10 mg Oral Daily  . aspirin EC  81 mg Oral Daily  . atorvastatin  20 mg Oral Daily  . azithromycin  500 mg Oral Q24H  . carvedilol  12.5 mg Oral BID WC  . famotidine  20 mg Oral Daily  . ferrous gluconate  324 mg Oral BID WC  . fluticasone  2 spray Each Nare Daily  . guaiFENesin  1,200 mg Oral BID  . heparin  5,000 Units Subcutaneous Q8H  . hydrALAZINE  25 mg Oral BID  . insulin aspart   0-9 Units Subcutaneous TID WC  . insulin glargine  10 Units Subcutaneous QHS  . ipratropium-albuterol  3 mL Nebulization BID  . lacosamide  200 mg Oral BID  . loratadine  10 mg Oral Daily  . multivitamin with minerals  1 tablet Oral Daily  . oseltamivir  30 mg Oral Daily  . sodium bicarbonate  650 mg Oral TID   Continuous Infusions: . cefTRIAXone (ROCEPHIN)  IV Stopped (10/18/18 2316)     LOS: 1 day    Time spent: 35 minutes    Irine Seal, MD [facility]  If 7PM-7AM, please contact night-coverage [email or web address] 10/19/2018, 1:25 PM

## 2018-10-20 LAB — CBC
HCT: 28.7 % — ABNORMAL LOW (ref 39.0–52.0)
Hemoglobin: 8.6 g/dL — ABNORMAL LOW (ref 13.0–17.0)
MCH: 29.6 pg (ref 26.0–34.0)
MCHC: 30 g/dL (ref 30.0–36.0)
MCV: 98.6 fL (ref 80.0–100.0)
Platelets: 252 10*3/uL (ref 150–400)
RBC: 2.91 MIL/uL — ABNORMAL LOW (ref 4.22–5.81)
RDW: 13.7 % (ref 11.5–15.5)
WBC: 6.6 10*3/uL (ref 4.0–10.5)
nRBC: 0.3 % — ABNORMAL HIGH (ref 0.0–0.2)

## 2018-10-20 LAB — GLUCOSE, CAPILLARY
Glucose-Capillary: 95 mg/dL (ref 70–99)
Glucose-Capillary: 124 mg/dL — ABNORMAL HIGH (ref 70–99)
Glucose-Capillary: 101 mg/dL — ABNORMAL HIGH (ref 70–99)
Glucose-Capillary: 136 mg/dL — ABNORMAL HIGH (ref 70–99)

## 2018-10-20 LAB — RENAL FUNCTION PANEL
Albumin: 3.1 g/dL — ABNORMAL LOW (ref 3.5–5.0)
Anion gap: 5 (ref 5–15)
BUN: 54 mg/dL — ABNORMAL HIGH (ref 8–23)
CO2: 19 mmol/L — ABNORMAL LOW (ref 22–32)
Calcium: 8.1 mg/dL — ABNORMAL LOW (ref 8.9–10.3)
Chloride: 119 mmol/L — ABNORMAL HIGH (ref 98–111)
Creatinine, Ser: 4.2 mg/dL — ABNORMAL HIGH (ref 0.61–1.24)
GFR calc Af Amer: 16 mL/min — ABNORMAL LOW (ref >60)
GFR calc non Af Amer: 14 mL/min — ABNORMAL LOW (ref >60)
Glucose, Bld: 112 mg/dL — ABNORMAL HIGH (ref 70–99)
Phosphorus: 4.1 mg/dL (ref 2.5–4.6)
Potassium: 4.3 mmol/L (ref 3.5–5.1)
Sodium: 143 mmol/L (ref 135–145)

## 2018-10-20 LAB — URINE CULTURE: Culture: NO GROWTH

## 2018-10-20 LAB — HEMOGLOBIN A1C
Hgb A1c MFr Bld: 6.6 % — ABNORMAL HIGH (ref 4.8–5.6)
Mean Plasma Glucose: 143 mg/dL

## 2018-10-20 LAB — LEGIONELLA PNEUMOPHILA SEROGP 1 UR AG: L. pneumophila Serogp 1 Ur Ag: NEGATIVE

## 2018-10-20 MED ORDER — ALUM & MAG HYDROXIDE-SIMETH 200-200-20 MG/5ML PO SUSP
30.0000 mL | Freq: Four times a day (QID) | ORAL | Status: DC | PRN
  Administered 2018-10-20: 30 mL via ORAL
  Filled 2018-10-20: qty 30

## 2018-10-20 MED ORDER — PANTOPRAZOLE SODIUM 40 MG PO TBEC
40.0000 mg | DELAYED_RELEASE_TABLET | Freq: Every day | ORAL | Status: DC
  Administered 2018-10-20 – 2018-10-21 (×2): 40 mg via ORAL
  Filled 2018-10-20 (×2): qty 1

## 2018-10-20 MED ORDER — SENNOSIDES-DOCUSATE SODIUM 8.6-50 MG PO TABS
1.0000 | ORAL_TABLET | Freq: Two times a day (BID) | ORAL | Status: DC
  Administered 2018-10-20 – 2018-10-21 (×2): 1 via ORAL
  Filled 2018-10-20 (×2): qty 1

## 2018-10-20 MED ORDER — CEFDINIR 300 MG PO CAPS
300.0000 mg | ORAL_CAPSULE | Freq: Two times a day (BID) | ORAL | Status: DC
  Administered 2018-10-20 – 2018-10-21 (×3): 300 mg via ORAL
  Filled 2018-10-20 (×3): qty 1

## 2018-10-20 MED ORDER — LIDOCAINE VISCOUS HCL 2 % MT SOLN
15.0000 mL | Freq: Four times a day (QID) | OROMUCOSAL | Status: DC | PRN
  Administered 2018-10-20: 15 mL via ORAL
  Filled 2018-10-20: qty 15

## 2018-10-20 MED ORDER — SIMETHICONE 80 MG PO CHEW
160.0000 mg | CHEWABLE_TABLET | Freq: Four times a day (QID) | ORAL | Status: DC | PRN
  Administered 2018-10-20: 160 mg via ORAL
  Filled 2018-10-20: qty 2

## 2018-10-20 MED ORDER — SODIUM CHLORIDE 0.45 % IV SOLN
INTRAVENOUS | Status: DC
  Administered 2018-10-20 (×2): via INTRAVENOUS

## 2018-10-20 NOTE — Progress Notes (Signed)
PROGRESS NOTE    Mark [name]  TFT:[id number] DOB: 1951-03-17 DOA: 10/17/2018 PCP: [name], MD    Brief Narrative:  Patient is a 68 year old pleasant gentleman history of chronic kidney disease stage V, COPD, hypertension, history of insulin-dependent diabetes mellitus, seizure disorder presenting to the ED with cough productive of greenish sputum, shortness of breath with exertion, sick contacts.  Work-up in the ED consistent with a left lower lobe pneumonia and influenza A.  Patient noted to be hypoxic on ambulation and subsequently admitted for further evaluation and management.  Patient placed empirically on antibiotics and Tamiflu.   Assessment & Plan:   Principal Problem:   CAP (community acquired pneumonia) Active Problems:   S/P BKA (below knee amputation) ([place])   Essential hypertension   Diabetes mellitus, type 2 ([place])   AKI (acute kidney injury) ([place])   Chronic kidney disease, stage V ([place])   Influenza A  1 community-acquired pneumonia in the setting of influenza A Patient admitted with worsening shortness of breath, productive cough, probable fever.  Chest x-ray done consistent with a left lower lobe pneumonia.  Influenza PCR was positive for influenza A.  Urine Legionella antigen negative.  Urine pneumococcus antigen negative.  Patient improving clinically.  Continue Tamiflu. DC IV Rocephin and oral azithromycin.  Place on oral Vantin to complete course of antibiotic treatment.  Continue Flonase, Mucinex, scheduled duo nebs, Claritin.   2.  Acute renal failure on chronic kidney disease stage V with normal anion gap metabolic acidosis Likely secondary to a prerenal azotemia due to poor oral intake and dehydration.  Creatinine on admission was 4.86.  Baseline usually around 3.5-3.6.  BUN also noted to be elevated.  Patient placed on gentle hydration and creatinine trending down and currently at 4.20 from 4.48.  Avoid nephrotoxic agents.  Urine output of 900 cc over  the past 24 hours.  Urinalysis nitrite negative, 100 protein, 0-5 WBCs.  Urine sodium of 89.  Urine creatinine of 82.65.  Bicarb improving with increase bicarb tablets to 3 times daily.  Continue to hold diuretics.  Decrease IV fluids to 50 cc/h.  Follow.    3.  History of COPD No wheezing noted on examination.  Patient not on any inhalers at home.  Follow.  4.  Status post left BKA Stable.  5.  Anemia of chronic disease Patient with no overt bleeding.  Hemoglobin stable at 8.6 from 9.4.  Continue oral iron supplementation.  Outpatient follow-up.  6.  Type 2 diabetes mellitus Hemoglobin A1c was 6.2 on 12/22/2017.  CBG 95 this morning.  Lantus and sliding scale insulin.  7.  Seizure disorder Patient noted to have refused his Vimpat stating he needed to drink lots of fluids to get it down and due to fluid restriction is why he refused Vimpat.  Patient with no seizures noted.  Fluid restriction has been increased to 1800 cc/day.  Monitor closely for volume overload.  Continue Vimpat.  Follow.  8.  Hypertension/chronic diastolic CHF Patient currently euvolemic on admission.  Patient mildly hypovolemic on admission.  Diuretics on hold.  2D echo from 2017 with normal systolic function, grade 1 diastolic dysfunction.  Continue current regimen of Norvasc, aspirin, Lipitor, Coreg, hydralazine.   DVT prophylaxis: Heparin Code Status: Full Family Communication: Updated patient.  No family at bedside. Disposition Plan: Likely home when clinically improved and no longer hypoxic on ambulation with no further nausea, hopefully in the next 24 to 48 hours.   Consultants:   None  Procedures:   Chest x-ray 10/17/2018    Antimicrobials:   Oral azithromycin 10/18/2018>>>>> 10/20/2018  IV azithromycin 218 20201 dose  IV Rocephin 10/17/2018>>> 10/20/2018   Tamiflu 10/18/2018   Vantin 10/20/2018   Subjective: Patient laying in bed.  States he is not feeling well today.  Complaining of nausea and  states when he took his pills he could taste pills coming up.  Denies any emesis.  No chest pain or shortness of breath.   Objective: Vitals:   10/19/18 2120 10/19/18 2146 10/20/18 0447 10/20/18 1342  BP:  (!) 151/73 (!) 153/74 (!) 171/87  Pulse:  62 (!) 59 62  Resp:  20 20 16   Temp:  99.5 F (37.5 C) 98 F (36.7 C) 98.3 F (36.8 C)  TempSrc:  Oral Oral Oral  SpO2: 96% 95% 97% 97%  Weight:   107.2 kg   Height:        Intake/Output Summary (Last 24 hours) at 10/20/2018 2018 Last data filed at 10/20/2018 1730 Gross per 24 hour  Intake 600 ml  Output 1350 ml  Net -750 ml   Filed Weights   10/17/18 2102 10/20/18 0447  Weight: 113 kg 107.2 kg    Examination:  General exam: NAD Respiratory system: Decreased breath sounds in the left base with some coarse breath sounds on the left.  No wheezing, no crackles.  Normal respiratory effort.  No use of accessory muscles of respiration.  Cardiovascular system: Regular rate and rhythm no murmurs rubs or gallops.  No JVD.  No lower extremity edema.  Gastrointestinal system: Abdomen is soft, nontender, nondistended, positive bowel sounds.  No rebound.  No guarding.  Central nervous system: Alert and oriented.  Moving extremities spontaneously.  No focal deficits.  Extremities: Symmetric 5 x 5 power. Skin: No rashes, lesions or ulcers Psychiatry: Judgement and insight appear normal. Mood & affect appropriate.     Data Reviewed: I have personally reviewed following labs and imaging studies  CBC: Recent Labs  Lab 10/17/18 2336 10/19/18 0342 10/20/18 0330  WBC 8.7 5.4 6.6  NEUTROABS 4.1  --   --   HGB 9.7* 9.4* 8.6*  HCT 32.2* 31.4* 28.7*  MCV 97.6 99.1 98.6  PLT 250 239 540   Basic Metabolic Panel: Recent Labs  Lab 10/17/18 2336 10/19/18 0342 10/20/18 0330  NA 139 142 143  K 5.1 4.4 4.3  CL 115* 118* 119*  CO2 16* 16* 19*  GLUCOSE 98 136* 112*  BUN 66* 59* 54*  CREATININE 4.86* 4.48* 4.20*  CALCIUM 8.4* 8.1* 8.1*   PHOS  --   --  4.1   GFR: Estimated Creatinine Clearance: 21.9 mL/min (A) (by C-G formula based on SCr of 4.2 mg/dL (H)). Liver Function Tests: Recent Labs  Lab 10/20/18 0330  ALBUMIN 3.1*   No results for input(s): LIPASE, AMYLASE in the last 168 hours. No results for input(s): AMMONIA in the last 168 hours. Coagulation Profile: No results for input(s): INR, PROTIME in the last 168 hours. Cardiac Enzymes: No results for input(s): CKTOTAL, CKMB, CKMBINDEX, TROPONINI in the last 168 hours. BNP (last 3 results) No results for input(s): PROBNP in the last 8760 hours. HbA1C: Recent Labs    10/19/18 0342  HGBA1C 6.6*   CBG: Recent Labs  Lab 10/19/18 1723 10/19/18 2137 10/20/18 0707 10/20/18 1109 10/20/18 1726  GLUCAP 151* 78 95 124* 101*   Lipid Profile: No results for input(s): CHOL, HDL, LDLCALC, TRIG, CHOLHDL, LDLDIRECT in the last 72 hours. Thyroid Function  Tests: No results for input(s): TSH, T4TOTAL, FREET4, T3FREE, THYROIDAB in the last 72 hours. Anemia Panel: No results for input(s): VITAMINB12, FOLATE, FERRITIN, TIBC, IRON, RETICCTPCT in the last 72 hours. Sepsis Labs: No results for input(s): PROCALCITON, LATICACIDVEN in the last 168 hours.  Recent Results (from the past 240 hour(s))  Culture, Urine     Status: None   Collection Time: 10/19/18  1:36 PM  Result Value Ref Range Status   Specimen Description   Final    URINE, RANDOM Performed at [facility] [address] [place], [place] [address]    Special Requests   Final    NONE Performed at [facility], [place] [address], [place], [place] [address]    Culture   Final    NO GROWTH Performed at [facility], [place] [address], [place], [place] [address]    Report Status 10/20/2018 FINAL  Final         Radiology Studies: No results found.      Scheduled Meds: . amLODipine  10 mg Oral Daily  . aspirin EC  81 mg Oral Daily  . atorvastatin  20 mg  Oral Daily  . azithromycin  500 mg Oral Q24H  . carvedilol  12.5 mg Oral BID WC  . cefdinir  300 mg Oral Q12H  . famotidine  20 mg Oral Daily  . ferrous gluconate  324 mg Oral BID WC  . fluticasone  2 spray Each Nare Daily  . guaiFENesin  1,200 mg Oral BID  . heparin  5,000 Units Subcutaneous Q8H  . hydrALAZINE  25 mg Oral BID  . insulin aspart  0-9 Units Subcutaneous TID WC  . insulin glargine  10 Units Subcutaneous QHS  . lacosamide  200 mg Oral BID  . loratadine  10 mg Oral Daily  . multivitamin with minerals  1 tablet Oral Daily  . oseltamivir  30 mg Oral Daily  . pantoprazole  40 mg Oral Daily  . senna-docusate  1 tablet Oral BID  . sodium bicarbonate  650 mg Oral TID   Continuous Infusions: . sodium chloride 75 mL/hr at 10/20/18 1306     LOS: 2 days    Time spent: 35 minutes    Irine Seal, MD [facility]  If 7PM-7AM, please contact night-coverage [email or web address] 10/20/2018, 8:18 PM

## 2018-10-20 NOTE — Progress Notes (Signed)
Patient refused Vimpat and states that he will talk to the doctor about it when he rounds.

## 2018-10-20 NOTE — Care Management Important Message (Signed)
Important Message  Patient Details  Name: Mark [name] MRN: [id number] Date of Birth: 06-28-1951   Medicare Important Message Given:  Yes    Kerin Salen 10/20/2018, 11:17 AMImportant Message  Patient Details  Name: Mark [name] MRN: [id number] Date of Birth: September 07, 1950   Medicare Important Message Given:  Yes    Kerin Salen 10/20/2018, 11:17 AM

## 2018-10-21 DIAGNOSIS — E118 Type 2 diabetes mellitus with unspecified complications: Secondary | ICD-10-CM

## 2018-10-21 DIAGNOSIS — Z89512 Acquired absence of left leg below knee: Secondary | ICD-10-CM

## 2018-10-21 LAB — BASIC METABOLIC PANEL
Anion gap: 6 (ref 5–15)
BUN: 51 mg/dL — ABNORMAL HIGH (ref 8–23)
CO2: 19 mmol/L — ABNORMAL LOW (ref 22–32)
Calcium: 8.4 mg/dL — ABNORMAL LOW (ref 8.9–10.3)
Chloride: 117 mmol/L — ABNORMAL HIGH (ref 98–111)
Creatinine, Ser: 3.86 mg/dL — ABNORMAL HIGH (ref 0.61–1.24)
GFR calc Af Amer: 18 mL/min — ABNORMAL LOW (ref >60)
GFR calc non Af Amer: 15 mL/min — ABNORMAL LOW (ref >60)
Glucose, Bld: 119 mg/dL — ABNORMAL HIGH (ref 70–99)
Potassium: 4.2 mmol/L (ref 3.5–5.1)
Sodium: 142 mmol/L (ref 135–145)

## 2018-10-21 LAB — GLUCOSE, CAPILLARY
Glucose-Capillary: 95 mg/dL (ref 70–99)
Glucose-Capillary: 139 mg/dL — ABNORMAL HIGH (ref 70–99)

## 2018-10-21 LAB — HEMOGLOBIN AND HEMATOCRIT, BLOOD
HCT: 31.7 % — ABNORMAL LOW (ref 39.0–52.0)
Hemoglobin: 9.3 g/dL — ABNORMAL LOW (ref 13.0–17.0)

## 2018-10-21 MED ORDER — FAMOTIDINE 20 MG PO TABS: 20.0000 mg | ORAL_TABLET | Freq: Every day | ORAL | 0 refills | Status: DC

## 2018-10-21 MED ORDER — SODIUM BICARBONATE 650 MG PO TABS: 650.0000 mg | ORAL_TABLET | Freq: Three times a day (TID) | ORAL | 0 refills | Status: DC

## 2018-10-21 MED ORDER — SENNOSIDES-DOCUSATE SODIUM 8.6-50 MG PO TABS: 1.0000 | ORAL_TABLET | Freq: Two times a day (BID) | ORAL | Status: DC

## 2018-10-21 MED ORDER — INSULIN GLARGINE 100 UNIT/ML SOLOSTAR PEN: 10.0000 [IU] | PEN_INJECTOR | Freq: Every day | SUBCUTANEOUS | 0 refills | Status: DC

## 2018-10-21 MED ORDER — LORATADINE 10 MG PO TABS: 10.0000 mg | ORAL_TABLET | Freq: Every day | ORAL | 0 refills | Status: DC

## 2018-10-21 MED ORDER — HYDRALAZINE HCL 20 MG/ML IJ SOLN
10.0000 mg | Freq: Four times a day (QID) | INTRAMUSCULAR | Status: DC | PRN
  Administered 2018-10-21: 10 mg via INTRAVENOUS
  Filled 2018-10-21: qty 1

## 2018-10-21 MED ORDER — FUROSEMIDE 20 MG PO TABS: 20.0000 mg | ORAL_TABLET | Freq: Every day | ORAL | Status: DC

## 2018-10-21 MED ORDER — FLUTICASONE PROPIONATE 50 MCG/ACT NA SUSP: 2.0000 | Freq: Every day | NASAL | Status: DC

## 2018-10-21 MED ORDER — CEFDINIR 300 MG PO CAPS: 300.0000 mg | ORAL_CAPSULE | Freq: Two times a day (BID) | ORAL | 0 refills | Status: AC

## 2018-10-21 MED ORDER — GUAIFENESIN ER 600 MG PO TB12: 1200.0000 mg | ORAL_TABLET | Freq: Two times a day (BID) | ORAL | 0 refills | Status: AC

## 2018-10-21 MED ORDER — OSELTAMIVIR PHOSPHATE 30 MG PO CAPS: 30.0000 mg | ORAL_CAPSULE | Freq: Every day | ORAL | 0 refills | Status: AC

## 2018-10-21 MED ORDER — PANTOPRAZOLE SODIUM 40 MG PO TBEC: 40.0000 mg | DELAYED_RELEASE_TABLET | Freq: Every day | ORAL | 0 refills | Status: DC

## 2018-10-21 NOTE — Progress Notes (Signed)
Patient discharged to home, discharge instructions reviewed with patient who verbalized understanding. New RX's given to patient.

## 2018-10-21 NOTE — Discharge Summary (Signed)
Physician Discharge Summary  PATRICK SALEMI DDU:[id number] DOB: [birth year] DOA: 10/17/2018  PCP: [name], MD  Admit date: 10/17/2018 Discharge date: 10/21/2018  Time spent: 55 minutes  Recommendations for Outpatient Follow-up:  1. Follow-up with [name], MD in 2 weeks.  On follow-up patient's pneumonia and flu will need to be reassessed.  Patient will need a basic metabolic profile done to follow-up on electrolytes and renal function. 2. Follow-up with Dr. Marval Regal, nephrology in 2 weeks.   Discharge Diagnoses:  Principal Problem:   CAP (community acquired pneumonia) Active Problems:   S/P BKA (below knee amputation) ([place])   Essential hypertension   Controlled type 2 diabetes mellitus with complication, with long-term current use of insulin (HCC)   AKI (acute kidney injury) ([employer])   Chronic kidney disease, stage V ([place])   Influenza A   Discharge Condition: Stable and improved  Diet recommendation: Carb modified diet.  Filed Weights   10/17/18 2102 10/20/18 0447  Weight: 113 kg 107.2 kg    History of present illness:  Per Dr Eldridge Abrahams is a 68 y.o. male with a past medical history of chronic kidney disease stage V, COPD, essential hypertension, history of insulin-dependent diabetes mellitus, seizure disorder who was in his usual state of health until a few days ago when he started developing a cough with greenish expectoration.  Denied any blood in the sputum.  He started developing shortness of breath with exertion.  He tells me that he was exposed to a child who was sick a few days ago.  Patient had fever although unable to tell me the temperature.  He has had poor oral intake the last few days.  Denied any nausea vomiting or diarrhea.  Denied any chest pain.  No dizziness or lightheadedness.  Has been making urine.  In the emergency department patient was found to have left lower lobe pneumonia.  Influenza PCR positive for influenza A.   Patient was hospitalized as his oxygen saturations dropped when he ambulated.   Hospital Course:  1 community-acquired pneumonia in the setting of influenza A Patient admitted with worsening shortness of breath, productive cough, probable fever.  Chest x-ray done consistent with a left lower lobe pneumonia.  Influenza PCR was positive for influenza A.  Urine Legionella antigen negative.  Urine pneumococcus antigen negative.  Patient initially placed on IV Rocephin and IV azithromycin on admission as well as Tamiflu.  Patient improved clinically IV azithromycin was discontinued.  IV Rocephin was transitioned to oral Vantin.  Patient be discharged home on 4 more days of oral Vantin to complete a 7-day course of antibiotic treatment.  Patient will need 1 more day of Tamiflu to complete a 5-day course of treatment.  Patient improved clinically and patient be discharged in stable and improved condition.   2.  Acute renal failure on chronic kidney disease stage V with normal anion gap metabolic acidosis Likely secondary to a prerenal azotemia due to poor oral intake and dehydration.  Creatinine on admission was 4.86.  Baseline usually around 3.5-3.6.  BUN also noted to be elevated.  Patient placed on gentle hydration and creatinine trended down and was 3.86 by day of discharge from 4.86 on admission.  Patient had good urine output during the hospitalization.  Urinalysis was unremarkable.  Patient's bicarb tablets were increased to 3 times daily.  Diuretics were held and will be resumed 4 days post discharge.  Outpatient follow-up with nephrology 2 weeks post discharge.  3.  History of COPD No wheezing noted on examination.  Patient not on any inhalers at home.  Follow.  4.  Status post left BKA Stable.  5.  Anemia of chronic disease Patient with no overt bleeding.  Hemoglobin remained stable during the hospitalization.  Patient maintained on home regimen of oral iron supplementation.  Outpatient  follow-up.    6.  Type 2 diabetes mellitus Hemoglobin A1c was 6.2 on 12/22/2017.    Patient maintained on Lantus 10 units daily as well as sliding scale insulin during the hospitalization.  Outpatient follow-up.   7.  Seizure disorder Patient had no seizure episodes throughout the hospitalization.  Patient maintained on home regimen of Vimpat.  Outpatient follow-up with PCP.   8.  Hypertension/chronic diastolic CHF Patient on admission with concerns for hypovolemia and mild dehydration noted to be in acute on chronic renal failure.  Patient's diuretics were held. 2D echo from 2017 with normal systolic function, grade 1 diastolic dysfunction.    Patient was maintained on home regimen of Norvasc, aspirin, Lipitor, Coreg, hydralazine.  Patient diuretics will be resumed 3 to 4 days post discharge.   Procedures:  Chest x-ray 10/17/2018  Consultations:  None  Discharge Exam: Vitals:   10/20/18 2139 10/21/18 0528  BP: (!) 176/90 (!) 161/100  Pulse: 68 60  Resp: 17 18  Temp: 98.6 F (37 C) 98.7 F (37.1 C)  SpO2: 98% 98%    General: NAD Cardiovascular: RRR Respiratory: Decreased coarse breath sounds in the left base.  No wheezing.  No crackles.  Discharge Instructions   Discharge Instructions    Diet Carb Modified   Complete by:  As directed    Increase activity slowly   Complete by:  As directed      Allergies as of 10/21/2018   No Known Allergies     Medication List    TAKE these medications   ACCU-CHEK AVIVA PLUS w/Device Kit Use as prescribed TID before meals and QHS   accu-chek soft touch lancets Use as instructed   ACCU-CHEK SOFTCLIX LANCETS lancets Use as instructed   acetaminophen 325 MG tablet Commonly known as:  TYLENOL Take 2 tablets (650 mg total) by mouth every 6 (six) hours as needed for mild pain (or Fever >/= 101).   amLODipine 10 MG tablet Commonly known as:  NORVASC TAKE 1 TABLET BY MOUTH  DAILY   aspirin EC 81 MG tablet Take 1 tablet  (81 mg total) by mouth daily.   atorvastatin 20 MG tablet Commonly known as:  LIPITOR TAKE 1 TABLET BY MOUTH  DAILY   bisacodyl 5 MG EC tablet Commonly known as:  DULCOLAX Take 5 mg by mouth daily as needed for moderate constipation.   carvedilol 12.5 MG tablet Commonly known as:  COREG TAKE 1.5 TABLET BY MOUTH 2 TIMES DAILY WITH A MEAL. What changed:    how to take this  when to take this   cefdinir 300 MG capsule Commonly known as:  OMNICEF Take 1 capsule (300 mg total) by mouth every 12 (twelve) hours for 4 days.   famotidine 20 MG tablet Commonly known as:  PEPCID Take 1 tablet (20 mg total) by mouth daily. Start taking on:  October 22, 2018   ferrous gluconate 324 MG tablet Commonly known as:  FERGON Take 1 tablet (324 mg total) by mouth 2 (two) times daily with a meal.   fluticasone 50 MCG/ACT nasal spray Commonly known as:  FLONASE Place 2 sprays into both nostrils daily. Start  taking on:  October 22, 2018   furosemide 20 MG tablet Commonly known as:  LASIX Take 1 tablet (20 mg total) by mouth daily. Start taking on:  October 25, 2018 What changed:  These instructions start on October 25, 2018. If you are unsure what to do until then, ask your doctor or other care provider.   glucose blood test strip Commonly known as:  ACCU-CHEK AVIVA PLUS 1 each by Other route 3 (three) times daily.   guaiFENesin 600 MG 12 hr tablet Commonly known as:  MUCINEX Take 2 tablets (1,200 mg total) by mouth 2 (two) times daily for 4 days.   hydrALAZINE 25 MG tablet Commonly known as:  APRESOLINE Take 1 tablet (25 mg total) by mouth 2 (two) times daily.   Insulin Glargine 100 UNIT/ML Solostar Pen Commonly known as:  LANTUS SOLOSTAR Inject 10 Units into the skin at bedtime. What changed:  how much to take   Insulin Pen Needle 32G X 4 MM Misc Commonly known as:  TRUEPLUS PEN NEEDLES Use as directed to administer lantus daily   INSULIN SYRINGE .5CC/30GX5/16" 30G X  5/16" 0.5 ML Misc Check blood sugar TID & QHS   lacosamide 200 MG Tabs tablet Commonly known as:  VIMPAT Take 1 tablet (200 mg total) by mouth 2 (two) times daily.   loratadine 10 MG tablet Commonly known as:  CLARITIN Take 1 tablet (10 mg total) by mouth daily. Start taking on:  October 22, 2018   multivitamin with minerals Tabs tablet Take 1 tablet by mouth daily.   oseltamivir 30 MG capsule Commonly known as:  TAMIFLU Take 1 capsule (30 mg total) by mouth daily for 1 day. Start taking on:  October 22, 2018   pantoprazole 40 MG tablet Commonly known as:  PROTONIX Take 1 tablet (40 mg total) by mouth daily. Start taking on:  October 22, 2018   senna-docusate 8.6-50 MG tablet Commonly known as:  Senokot-S Take 1 tablet by mouth 2 (two) times daily.   sodium bicarbonate 650 MG tablet Take 1 tablet (650 mg total) by mouth 3 (three) times daily.      No Known Allergies Follow-up Information    [name], MD. Schedule an appointment as soon as possible for a visit in 2 week(s).   Specialty:  Internal Medicine Contact information: [place] [address] [phone]        Donato Heinz, MD. Schedule an appointment as soon as possible for a visit in 2 week(s).   Specialty:  Nephrology Contact information: [place] [address] [phone]            The results of significant diagnostics from this hospitalization (including imaging, microbiology, ancillary and laboratory) are listed below for reference.    Significant Diagnostic Studies: Dg Chest 2 View  Result Date: 10/17/2018 CLINICAL DATA:  68 year old male with cough and chest congestion for 4 days. Monoclonal gammopathy. EXAM: CHEST - 2 VIEW COMPARISON:  Chest radiographs 08/31/2017 and earlier. FINDINGS: Streaky peribronchial opacity at the left lung base. No pleural effusion. Elsewhere lung parenchyma appears stable and clear. Stable cardiac size and  mediastinal contours. Visualized tracheal air column is within normal limits. No acute osseous abnormality identified. Negative visible bowel gas pattern. IMPRESSION: Acute left lung base opacity compatible with Bronchopneumonia. No pleural effusion. Followup PA and lateral chest X-ray is recommended in 3-4 weeks following trial of antibiotic therapy to ensure resolution and exclude underlying malignancy. Electronically Signed  By: Genevie Ann M.D.   On: 10/17/2018 23:04    Microbiology: Recent Results (from the past 240 hour(s))  Culture, Urine     Status: None   Collection Time: 10/19/18  1:36 PM  Result Value Ref Range Status   Specimen Description   Final    URINE, RANDOM Performed at [facility], [place] [address] [place], [place] [address]    Special Requests   Final    NONE Performed at [facility], [place] [address], [place], [place] [address]    Culture   Final    NO GROWTH Performed at [facility], [place] [address], [place], [place] [address]    Report Status 10/20/2018 FINAL  Final     Labs: Basic Metabolic Panel: Recent Labs  Lab 10/17/18 2336 10/19/18 0342 10/20/18 0330 10/21/18 0937  NA 139 142 143 142  K 5.1 4.4 4.3 4.2  CL 115* 118* 119* 117*  CO2 16* 16* 19* 19*  GLUCOSE 98 136* 112* 119*  BUN 66* 59* 54* 51*  CREATININE 4.86* 4.48* 4.20* 3.86*  CALCIUM 8.4* 8.1* 8.1* 8.4*  PHOS  --   --  4.1  --    Liver Function Tests: Recent Labs  Lab 10/20/18 0330  ALBUMIN 3.1*   No results for input(s): LIPASE, AMYLASE in the last 168 hours. No results for input(s): AMMONIA in the last 168 hours. CBC: Recent Labs  Lab 10/17/18 2336 10/19/18 0342 10/20/18 0330 10/21/18 0937  WBC 8.7 5.4 6.6  --   NEUTROABS 4.1  --   --   --   HGB 9.7* 9.4* 8.6* 9.3*  HCT 32.2* 31.4* 28.7* 31.7*  MCV 97.6 99.1 98.6  --   PLT 250 239 252  --    Cardiac Enzymes: No results for input(s): CKTOTAL, CKMB, CKMBINDEX, TROPONINI in the  last 168 hours. BNP: BNP (last 3 results) No results for input(s): BNP in the last 8760 hours.  ProBNP (last 3 results) No results for input(s): PROBNP in the last 8760 hours.  CBG: Recent Labs  Lab 10/20/18 1109 10/20/18 1726 10/20/18 2142 10/21/18 0740 10/21/18 1202  GLUCAP 124* 101* 136* 95 139*       Signed:  Irine Seal MD.  [facility] 10/21/2018, 1:30 PM

## 2018-10-23 ENCOUNTER — Telehealth: Payer: Self-pay | Admitting: Internal Medicine

## 2018-10-23 NOTE — Telephone Encounter (Signed)
-----   Message from [name], MD sent at 10/22/2018  5:37 PM EST ----- Regarding: needs hosp f/u appt with me

## 2018-10-23 NOTE — Telephone Encounter (Signed)
Called and spoke to the patient and informed him that the doctor wanted to see him. I put him in at 9:10 on the 10

## 2018-10-24 ENCOUNTER — Ambulatory Visit (HOSPITAL_COMMUNITY)
Admission: RE | Admit: 2018-10-24 | Discharge: 2018-10-24 | Disposition: A | Discharge status: Home / Self Care | Payer: Medicare Other | Source: Ambulatory Visit | Attending: Nephrology | Admitting: Nephrology

## 2018-10-24 VITALS — BP 156/76 | HR 73 | Temp 98.6°F | Resp 20 | Ht 74.0 in | Wt 239.0 lb

## 2018-10-24 DIAGNOSIS — N183 Chronic kidney disease, stage 3 unspecified: Secondary | ICD-10-CM

## 2018-10-24 LAB — POCT HEMOGLOBIN-HEMACUE: Hemoglobin: 9 g/dL — ABNORMAL LOW (ref 13.0–17.0)

## 2018-10-24 MED ORDER — SODIUM CHLORIDE 0.9 % IV SOLN
510.0000 mg | INTRAVENOUS | Status: AC
  Administered 2018-10-24: 510 mg via INTRAVENOUS
  Filled 2018-10-24: qty 510

## 2018-10-24 MED ORDER — EPOETIN ALFA-EPBX 10000 UNIT/ML IJ SOLN
10000.0000 [IU] | INTRAMUSCULAR | Status: DC
  Administered 2018-10-24: 10000 [IU] via SUBCUTANEOUS
  Filled 2018-10-24: qty 1

## 2018-11-07 ENCOUNTER — Inpatient Hospital Stay: Payer: Medicare Other | Admitting: Internal Medicine

## 2018-11-21 ENCOUNTER — Other Ambulatory Visit: Payer: Self-pay

## 2018-11-21 ENCOUNTER — Ambulatory Visit (HOSPITAL_COMMUNITY)
Admission: RE | Admit: 2018-11-21 | Discharge: 2018-11-21 | Disposition: A | Discharge status: Home / Self Care | Payer: Medicare Other | Source: Ambulatory Visit | Attending: Nephrology | Admitting: Nephrology

## 2018-11-21 VITALS — BP 151/77 | HR 76 | Temp 98.5°F | Resp 16

## 2018-11-21 DIAGNOSIS — N183 Chronic kidney disease, stage 3 unspecified: Secondary | ICD-10-CM

## 2018-11-21 DIAGNOSIS — Z79899 Other long term (current) drug therapy: Secondary | ICD-10-CM | POA: Diagnosis not present

## 2018-11-21 DIAGNOSIS — D631 Anemia in chronic kidney disease: Secondary | ICD-10-CM | POA: Diagnosis not present

## 2018-11-21 DIAGNOSIS — N184 Chronic kidney disease, stage 4 (severe): Secondary | ICD-10-CM | POA: Diagnosis not present

## 2018-11-21 DIAGNOSIS — Z5181 Encounter for therapeutic drug level monitoring: Secondary | ICD-10-CM | POA: Diagnosis not present

## 2018-11-21 LAB — IRON AND TIBC
Iron: 63 ug/dL (ref 45–182)
Saturation Ratios: 34 % (ref 17.9–39.5)
TIBC: 185 ug/dL — ABNORMAL LOW (ref 250–450)
UIBC: 122 ug/dL

## 2018-11-21 LAB — PHOSPHORUS: Phosphorus: 3.9 mg/dL (ref 2.5–4.6)

## 2018-11-21 LAB — COMPREHENSIVE METABOLIC PANEL
ALT: 23 U/L (ref 0–44)
AST: 18 U/L (ref 15–41)
Albumin: 3.4 g/dL — ABNORMAL LOW (ref 3.5–5.0)
Alkaline Phosphatase: 88 U/L (ref 38–126)
Anion gap: 5 (ref 5–15)
BUN: 59 mg/dL — ABNORMAL HIGH (ref 8–23)
CO2: 18 mmol/L — ABNORMAL LOW (ref 22–32)
Calcium: 8.8 mg/dL — ABNORMAL LOW (ref 8.9–10.3)
Chloride: 119 mmol/L — ABNORMAL HIGH (ref 98–111)
Creatinine, Ser: 4.17 mg/dL — ABNORMAL HIGH (ref 0.61–1.24)
GFR calc Af Amer: 16 mL/min — ABNORMAL LOW (ref >60)
GFR calc non Af Amer: 14 mL/min — ABNORMAL LOW (ref >60)
Glucose, Bld: 147 mg/dL — ABNORMAL HIGH (ref 70–99)
Potassium: 5 mmol/L (ref 3.5–5.1)
Sodium: 142 mmol/L (ref 135–145)
Total Bilirubin: 0.5 mg/dL (ref 0.3–1.2)
Total Protein: 7.7 g/dL (ref 6.5–8.1)

## 2018-11-21 LAB — POCT HEMOGLOBIN-HEMACUE: Hemoglobin: 10.4 g/dL — ABNORMAL LOW (ref 13.0–17.0)

## 2018-11-21 LAB — FERRITIN: Ferritin: 346 ng/mL — ABNORMAL HIGH (ref 24–336)

## 2018-11-21 MED ORDER — EPOETIN ALFA-EPBX 10000 UNIT/ML IJ SOLN
10000.0000 [IU] | INTRAMUSCULAR | Status: DC
  Administered 2018-11-21: 10000 [IU] via SUBCUTANEOUS
  Filled 2018-11-21: qty 1

## 2018-11-27 ENCOUNTER — Inpatient Hospital Stay: Payer: Medicare Other | Attending: Hematology

## 2018-11-27 ENCOUNTER — Other Ambulatory Visit: Payer: Self-pay

## 2018-11-27 DIAGNOSIS — D472 Monoclonal gammopathy: Secondary | ICD-10-CM | POA: Diagnosis not present

## 2018-11-27 LAB — CBC WITH DIFFERENTIAL (CANCER CENTER ONLY)
Abs Immature Granulocytes: 0.02 10*3/uL (ref 0.00–0.07)
Basophils Absolute: 0 10*3/uL (ref 0.0–0.1)
Basophils Relative: 1 %
Eosinophils Absolute: 0.6 10*3/uL — ABNORMAL HIGH (ref 0.0–0.5)
Eosinophils Relative: 8 %
HCT: 32 % — ABNORMAL LOW (ref 39.0–52.0)
Hemoglobin: 9.9 g/dL — ABNORMAL LOW (ref 13.0–17.0)
Immature Granulocytes: 0 %
Lymphocytes Relative: 28 %
Lymphs Abs: 2.3 10*3/uL (ref 0.7–4.0)
MCH: 29.6 pg (ref 26.0–34.0)
MCHC: 30.9 g/dL (ref 30.0–36.0)
MCV: 95.8 fL (ref 80.0–100.0)
Monocytes Absolute: 0.6 10*3/uL (ref 0.1–1.0)
Monocytes Relative: 8 %
Neutro Abs: 4.6 10*3/uL (ref 1.7–7.7)
Neutrophils Relative %: 55 %
Platelet Count: 311 10*3/uL (ref 150–400)
RBC: 3.34 MIL/uL — ABNORMAL LOW (ref 4.22–5.81)
RDW: 14.5 % (ref 11.5–15.5)
WBC Count: 8.1 10*3/uL (ref 4.0–10.5)
nRBC: 0 % (ref 0.0–0.2)

## 2018-11-27 LAB — CMP (CANCER CENTER ONLY)
ALT: 18 U/L (ref 0–44)
AST: 13 U/L — ABNORMAL LOW (ref 15–41)
Albumin: 3.3 g/dL — ABNORMAL LOW (ref 3.5–5.0)
Alkaline Phosphatase: 96 U/L (ref 38–126)
Anion gap: 9 (ref 5–15)
BUN: 60 mg/dL — ABNORMAL HIGH (ref 8–23)
CO2: 18 mmol/L — ABNORMAL LOW (ref 22–32)
Calcium: 8.8 mg/dL — ABNORMAL LOW (ref 8.9–10.3)
Chloride: 117 mmol/L — ABNORMAL HIGH (ref 98–111)
Creatinine: 4.24 mg/dL (ref 0.61–1.24)
GFR, Est AFR Am: 16 mL/min — ABNORMAL LOW (ref >60)
GFR, Estimated: 14 mL/min — ABNORMAL LOW (ref >60)
Glucose, Bld: 170 mg/dL — ABNORMAL HIGH (ref 70–99)
Potassium: 4.8 mmol/L (ref 3.5–5.1)
Sodium: 144 mmol/L (ref 135–145)
Total Bilirubin: 0.2 mg/dL — ABNORMAL LOW (ref 0.3–1.2)
Total Protein: 7.7 g/dL (ref 6.5–8.1)

## 2018-11-28 ENCOUNTER — Encounter (INDEPENDENT_AMBULATORY_CARE_PROVIDER_SITE_OTHER): Payer: Self-pay | Admitting: Orthopedic Surgery

## 2018-11-28 ENCOUNTER — Ambulatory Visit (INDEPENDENT_AMBULATORY_CARE_PROVIDER_SITE_OTHER): Payer: Medicare Other | Admitting: Orthopedic Surgery

## 2018-11-28 VITALS — Ht 74.0 in | Wt 239.0 lb

## 2018-11-28 DIAGNOSIS — Z89512 Acquired absence of left leg below knee: Secondary | ICD-10-CM | POA: Diagnosis not present

## 2018-11-28 DIAGNOSIS — L97921 Non-pressure chronic ulcer of unspecified part of left lower leg limited to breakdown of skin: Secondary | ICD-10-CM

## 2018-11-28 LAB — MULTIPLE MYELOMA PANEL, SERUM
Albumin SerPl Elph-Mcnc: 3.5 g/dL (ref 2.9–4.4)
Albumin/Glob SerPl: 1 (ref 0.7–1.7)
Alpha 1: 0.2 g/dL (ref 0.0–0.4)
Alpha2 Glob SerPl Elph-Mcnc: 0.9 g/dL (ref 0.4–1.0)
B-Globulin SerPl Elph-Mcnc: 0.8 g/dL (ref 0.7–1.3)
Gamma Glob SerPl Elph-Mcnc: 1.8 g/dL (ref 0.4–1.8)
Globulin, Total: 3.7 g/dL (ref 2.2–3.9)
IgA: 219 mg/dL (ref 61–437)
IgG (Immunoglobin G), Serum: 1803 mg/dL — ABNORMAL HIGH (ref 603–1613)
IgM (Immunoglobulin M), Srm: 348 mg/dL — ABNORMAL HIGH (ref 20–172)
M Protein SerPl Elph-Mcnc: 0.6 g/dL — ABNORMAL HIGH
Total Protein ELP: 7.2 g/dL (ref 6.0–8.5)

## 2018-11-28 LAB — KAPPA/LAMBDA LIGHT CHAINS
Kappa free light chain: 183.8 mg/L — ABNORMAL HIGH (ref 3.3–19.4)
Kappa, lambda light chain ratio: 2.62 — ABNORMAL HIGH (ref 0.26–1.65)
Lambda free light chains: 70.2 mg/L — ABNORMAL HIGH (ref 5.7–26.3)
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
## 2018-11-29 ENCOUNTER — Telehealth: Payer: Self-pay

## 2018-11-29 ENCOUNTER — Telehealth (INDEPENDENT_AMBULATORY_CARE_PROVIDER_SITE_OTHER): Payer: Self-pay | Admitting: Orthopedic Surgery

## 2018-11-29 NOTE — Telephone Encounter (Signed)
Patient called asked if she can get a Rx for some crutches. The number to contact patient is [phone]

## 2018-11-29 NOTE — Telephone Encounter (Signed)
Sent to [facility] fax [phone]

## 2018-11-29 NOTE — Telephone Encounter (Signed)
Spoke with patient regarding lab results, per Dr. Burr Medico M-protein, IGM and light chain levels are stable, anemia and CKD are also stable, no new concerns.  Patient verbalized an understanding.

## 2018-11-29 NOTE — Telephone Encounter (Signed)
Please send rx for crutches to [facility] or [facility]

## 2018-11-29 NOTE — Telephone Encounter (Signed)
-----   Message from Truitt Merle, MD sent at 11/28/2018  5:27 PM EDT ----- Please let pt know his lab results, M-protein, IgM and light chain levels are stable, anemia and CKD also stable, no new concerns, continue monitoring, thanks   Truitt Merle  11/28/2018

## 2018-11-29 NOTE — Telephone Encounter (Signed)
Please advise 

## 2018-11-30 DIAGNOSIS — L97921 Non-pressure chronic ulcer of unspecified part of left lower leg limited to breakdown of skin: Secondary | ICD-10-CM | POA: Diagnosis not present

## 2018-11-30 DIAGNOSIS — Z89512 Acquired absence of left leg below knee: Secondary | ICD-10-CM | POA: Diagnosis not present

## 2018-12-03 ENCOUNTER — Other Ambulatory Visit: Payer: Self-pay | Admitting: Internal Medicine

## 2018-12-03 DIAGNOSIS — I10 Essential (primary) hypertension: Secondary | ICD-10-CM

## 2018-12-04 ENCOUNTER — Ambulatory Visit: Payer: Medicare Other | Admitting: Adult Health

## 2018-12-04 ENCOUNTER — Telehealth (INDEPENDENT_AMBULATORY_CARE_PROVIDER_SITE_OTHER): Payer: Self-pay | Admitting: Radiology

## 2018-12-04 ENCOUNTER — Ambulatory Visit: Payer: Medicare Other | Admitting: Neurology

## 2018-12-04 NOTE — Telephone Encounter (Signed)
Patient called triage and states that he has increased fluid/blister on his stump. Appointment made to be seen in the office 12/05/2018 at [facility] with Dr. Sharol Given. Patient answered "No" to all COVID-19 screening questions.

## 2018-12-05 ENCOUNTER — Encounter (INDEPENDENT_AMBULATORY_CARE_PROVIDER_SITE_OTHER): Payer: Self-pay | Admitting: Orthopedic Surgery

## 2018-12-05 ENCOUNTER — Other Ambulatory Visit: Payer: Self-pay

## 2018-12-05 ENCOUNTER — Ambulatory Visit (INDEPENDENT_AMBULATORY_CARE_PROVIDER_SITE_OTHER): Payer: Medicare Other | Admitting: Physician Assistant

## 2018-12-05 VITALS — Ht 74.0 in | Wt 239.0 lb

## 2018-12-05 DIAGNOSIS — Z89512 Acquired absence of left leg below knee: Secondary | ICD-10-CM | POA: Diagnosis not present

## 2018-12-05 DIAGNOSIS — L97809 Non-pressure chronic ulcer of other part of unspecified lower leg with unspecified severity: Secondary | ICD-10-CM

## 2018-12-05 DIAGNOSIS — T8789 Other complications of amputation stump: Secondary | ICD-10-CM | POA: Diagnosis not present

## 2018-12-05 MED ORDER — DOXYCYCLINE HYCLATE 100 MG PO CAPS: 100.0000 mg | ORAL_CAPSULE | Freq: Two times a day (BID) | ORAL | 1 refills | Status: DC
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
## 2018-12-19 ENCOUNTER — Ambulatory Visit (INDEPENDENT_AMBULATORY_CARE_PROVIDER_SITE_OTHER): Payer: Medicare Other | Admitting: Physician Assistant

## 2018-12-19 ENCOUNTER — Ambulatory Visit (HOSPITAL_COMMUNITY)
Admission: RE | Admit: 2018-12-19 | Discharge: 2018-12-19 | Disposition: A | Discharge status: Home / Self Care | Payer: Medicare Other | Source: Ambulatory Visit | Attending: Nephrology | Admitting: Nephrology

## 2018-12-19 ENCOUNTER — Other Ambulatory Visit: Payer: Self-pay

## 2018-12-19 ENCOUNTER — Encounter (INDEPENDENT_AMBULATORY_CARE_PROVIDER_SITE_OTHER): Payer: Self-pay | Admitting: Orthopedic Surgery

## 2018-12-19 VITALS — Ht 74.0 in | Wt 239.0 lb

## 2018-12-19 VITALS — BP 162/87 | HR 71 | Temp 98.0°F | Resp 20

## 2018-12-19 DIAGNOSIS — Z89512 Acquired absence of left leg below knee: Secondary | ICD-10-CM

## 2018-12-19 DIAGNOSIS — N183 Chronic kidney disease, stage 3 unspecified: Secondary | ICD-10-CM

## 2018-12-19 LAB — POCT HEMOGLOBIN-HEMACUE: Hemoglobin: 10.8 g/dL — ABNORMAL LOW (ref 13.0–17.0)

## 2018-12-19 MED ORDER — EPOETIN ALFA-EPBX 10000 UNIT/ML IJ SOLN
10000.0000 [IU] | INTRAMUSCULAR | Status: DC
  Administered 2018-12-19: 10000 [IU] via SUBCUTANEOUS
  Filled 2018-12-19: qty 1
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
## 2018-12-27 ENCOUNTER — Telehealth (INDEPENDENT_AMBULATORY_CARE_PROVIDER_SITE_OTHER): Payer: Self-pay

## 2018-12-27 ENCOUNTER — Ambulatory Visit (INDEPENDENT_AMBULATORY_CARE_PROVIDER_SITE_OTHER): Payer: Medicare Other | Admitting: Family

## 2018-12-27 NOTE — Telephone Encounter (Signed)
Patient states he is still having swelling. Stump shrinker will not fit due to it being swollen. Having some pain. Taking Tylenol for pain PRN.   [phone]

## 2018-12-27 NOTE — Telephone Encounter (Signed)
Called and sw pt he will come in tomorrow at 11am. Can you please add to schedule?

## 2018-12-28 ENCOUNTER — Other Ambulatory Visit: Payer: Self-pay

## 2018-12-28 ENCOUNTER — Encounter (INDEPENDENT_AMBULATORY_CARE_PROVIDER_SITE_OTHER): Payer: Self-pay | Admitting: Orthopedic Surgery

## 2018-12-28 ENCOUNTER — Ambulatory Visit (INDEPENDENT_AMBULATORY_CARE_PROVIDER_SITE_OTHER): Payer: Medicare Other | Admitting: Orthopedic Surgery

## 2018-12-28 VITALS — Ht 74.0 in | Wt 239.0 lb

## 2018-12-28 DIAGNOSIS — Z89512 Acquired absence of left leg below knee: Secondary | ICD-10-CM | POA: Diagnosis not present
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
## 2019-01-02 ENCOUNTER — Ambulatory Visit: Payer: Medicare Other | Attending: Internal Medicine | Admitting: Internal Medicine

## 2019-01-02 ENCOUNTER — Other Ambulatory Visit: Payer: Self-pay

## 2019-01-02 DIAGNOSIS — Z794 Long term (current) use of insulin: Secondary | ICD-10-CM

## 2019-01-02 DIAGNOSIS — I10 Essential (primary) hypertension: Secondary | ICD-10-CM

## 2019-01-02 DIAGNOSIS — D472 Monoclonal gammopathy: Secondary | ICD-10-CM

## 2019-01-02 DIAGNOSIS — E118 Type 2 diabetes mellitus with unspecified complications: Secondary | ICD-10-CM | POA: Diagnosis not present

## 2019-01-02 DIAGNOSIS — N184 Chronic kidney disease, stage 4 (severe): Secondary | ICD-10-CM

## 2019-01-02 DIAGNOSIS — D631 Anemia in chronic kidney disease: Secondary | ICD-10-CM

## 2019-01-02 DIAGNOSIS — Z89512 Acquired absence of left leg below knee: Secondary | ICD-10-CM

## 2019-01-02 NOTE — Progress Notes (Signed)
Virtual Visit via Telephone Note  I connected with Mark [name] on 01/02/19 at 1:02 p.m EDT by telephone  from my office and verified that I am speaking with the correct person using two identifiers.  The patient is at home.  Only the patient and myself participated in this encounter.   I discussed the limitations, risks, security and privacy concerns of performing an evaluation and management service by telephone and the availability of in person appointments. I also discussed with the patient that there may be a patient responsible charge related to this service. The patient expressed understanding and agreed to proceed.   History of Present Illness: Pt with hx of HTN, chronic diastolic CHF, DM type 2 with microalbumin, Sz disorder(12/2015 with abnormal MRI and EEG and UDS + cocaine), CKD stage4, IDA, LT BKA, MGUS.  Last seen by me in early February.  Since last visit with me patient was hospitalized mid February with left lower lobe pneumonia and influenza.  Patient reports he is doing much better now.  DM:  Check BS in a.m and bedtime. Reports morning range 99-138 and bedtime range highest was 180 Eating habits:  Doing well. Med:  Takes 10-20 units of Lantus at nights depending on if BS is low. Denies frequent low BS episodes  LT BKA:  Seeing Dr. Sharol Given.  Having some swelling in stump.  Currently wearing a shrinker and plan is to get measure for another prosthesis.  HTN/CKDdiastolic CHF:  No device to check BP.  Reports his insurance is suppose to be sending him one.  He has not seen the nephrology in a while.  He was called on Friday for appt and he missed the call.  He called back and left message but has not heard back as yet -making good urine -no swelling in RT leg No CP/SOB/PND/dizziness -reports compliance with meds  MGUS/Anemia:  Saw Dr. Burr Medico recently.  Ds is stable.  Observations/Objective: Lab Results  Component Value Date   WBC 8.1 11/27/2018   HGB 10.8 (L) 12/19/2018    HCT 32.0 (L) 11/27/2018   MCV 95.8 11/27/2018   PLT 311 11/27/2018     Chemistry      Component Value Date/Time   NA 144 11/27/2018 1328   NA 145 (H) 10/18/2017 1424   K 4.8 11/27/2018 1328   CL 117 (H) 11/27/2018 1328   CO2 18 (L) 11/27/2018 1328   BUN 60 (H) 11/27/2018 1328   BUN 54 (H) 10/18/2017 1424   CREATININE 4.24 (HH) 11/27/2018 1328   CREATININE 2.16 (H) 11/16/2016 1148   GLU 175 01/14/2016      Component Value Date/Time   CALCIUM 8.8 (L) 11/27/2018 1328   ALKPHOS 96 11/27/2018 1328   AST 13 (L) 11/27/2018 1328   ALT 18 11/27/2018 1328   BILITOT 0.2 (L) 11/27/2018 1328     Lab Results  Component Value Date   IRON 63 11/21/2018   TIBC 185 (L) 11/21/2018   FERRITIN 346 (H) 11/21/2018     Assessment and Plan: 1. Controlled type 2 diabetes mellitus with complication, with long-term current use of insulin ([place]) Reported blood sugars are at goal. Advised him to keep the Lantus at 10 units at bedtime to help avoid hypoglycemia. Encouraged him to continue healthy eating habits and regular exercise  2. Essential hypertension Level of control unknown at this time.  Told patient that if he is unable to get a home blood pressure monitor through his insurance he should look into purchasing  one out-of-pocket.  It is very important that he keeps the blood pressure under control  continue low-salt diet  3. CKD (chronic kidney disease) stage 4, GFR 15-29 ml/min ([place]) Advised patient to call [facility] later this week if he does not hear back from them by that time  4. Anemia due to stage 4 chronic kidney disease (HCC) Stable  5. MGUS (monoclonal gammopathy of unknown significance) Followed by oncology  6. Acquired absence of left leg below knee (HCC) Still working with orthopedics as his stump continues to heal   Follow Up Instructions: Follow-up in 3 months or sooner if any acute issues   I discussed the assessment and treatment plan with the  patient. The patient was provided an opportunity to ask questions and all were answered. The patient agreed with the plan and demonstrated an understanding of the instructions.   The patient was advised to call back or seek an in-person evaluation if the symptoms worsen or if the condition fails to improve as anticipated.  I provided 12 minutes of non-face-to-face time during this encounter.   Karle Plumber, MD

## 2019-01-09 ENCOUNTER — Ambulatory Visit: Payer: Self-pay | Admitting: Orthopedic Surgery

## 2019-01-16 ENCOUNTER — Other Ambulatory Visit: Payer: Self-pay

## 2019-01-16 ENCOUNTER — Ambulatory Visit (HOSPITAL_COMMUNITY)
Admission: RE | Admit: 2019-01-16 | Discharge: 2019-01-16 | Disposition: A | Discharge status: Home / Self Care | Payer: Medicare Other | Source: Ambulatory Visit | Attending: Nephrology | Admitting: Nephrology

## 2019-01-16 VITALS — BP 134/77 | HR 68 | Temp 98.0°F | Resp 20

## 2019-01-16 DIAGNOSIS — D631 Anemia in chronic kidney disease: Secondary | ICD-10-CM | POA: Diagnosis not present

## 2019-01-16 DIAGNOSIS — N183 Chronic kidney disease, stage 3 unspecified: Secondary | ICD-10-CM

## 2019-01-16 LAB — COMPREHENSIVE METABOLIC PANEL
ALT: 15 U/L (ref 0–44)
AST: 17 U/L (ref 15–41)
Albumin: 3.6 g/dL (ref 3.5–5.0)
Alkaline Phosphatase: 72 U/L (ref 38–126)
Anion gap: 9 (ref 5–15)
BUN: 64 mg/dL — ABNORMAL HIGH (ref 8–23)
CO2: 17 mmol/L — ABNORMAL LOW (ref 22–32)
Calcium: 9.1 mg/dL (ref 8.9–10.3)
Chloride: 117 mmol/L — ABNORMAL HIGH (ref 98–111)
Creatinine, Ser: 4.51 mg/dL — ABNORMAL HIGH (ref 0.61–1.24)
GFR calc Af Amer: 15 mL/min — ABNORMAL LOW (ref >60)
GFR calc non Af Amer: 13 mL/min — ABNORMAL LOW (ref >60)
Glucose, Bld: 114 mg/dL — ABNORMAL HIGH (ref 70–99)
Potassium: 5.5 mmol/L — ABNORMAL HIGH (ref 3.5–5.1)
Sodium: 143 mmol/L (ref 135–145)
Total Bilirubin: 1.1 mg/dL (ref 0.3–1.2)
Total Protein: 7.6 g/dL (ref 6.5–8.1)

## 2019-01-16 LAB — IRON AND TIBC
Iron: 53 ug/dL (ref 45–182)
Saturation Ratios: 26 % (ref 17.9–39.5)
TIBC: 206 ug/dL — ABNORMAL LOW (ref 250–450)
UIBC: 153 ug/dL

## 2019-01-16 LAB — PHOSPHORUS: Phosphorus: 4.2 mg/dL (ref 2.5–4.6)

## 2019-01-16 LAB — POCT HEMOGLOBIN-HEMACUE: Hemoglobin: 10.1 g/dL — ABNORMAL LOW (ref 13.0–17.0)

## 2019-01-16 LAB — FERRITIN: Ferritin: 234 ng/mL (ref 24–336)

## 2019-01-16 MED ORDER — EPOETIN ALFA-EPBX 10000 UNIT/ML IJ SOLN
10000.0000 [IU] | INTRAMUSCULAR | Status: DC
  Administered 2019-01-16: 10000 [IU] via SUBCUTANEOUS
  Filled 2019-01-16: qty 1

## 2019-01-17 ENCOUNTER — Telehealth: Payer: Self-pay | Admitting: *Deleted

## 2019-01-17 ENCOUNTER — Telehealth: Payer: Self-pay

## 2019-01-17 DIAGNOSIS — R569 Unspecified convulsions: Secondary | ICD-10-CM

## 2019-01-17 NOTE — Telephone Encounter (Addendum)
Drug registry checked last filled 06-07-18 #180  [facility] and he states that they have last prescribed 09-19-18 #30.  I called pt.  He stated that he had 3 refills available when he got the fill [facility].  He states that other MD ?? fills for him as well.  He states that he has 15 days left.  Appt made 01-29-19 with SS/NP.  Refill?  [facility].  [facility]  [phone] (open 9a). Will call back. [facility] 10-13-2017 last filled per Cecille Rubin at [address]. [facility] advise.  Per [place] they have never filled for pt. (they do not fill vimpat there).   LMVM for wife to return call.

## 2019-01-17 NOTE — Telephone Encounter (Signed)
I called pt to make 1 yr f/u with pt.  He states that he has no smartphone or laptop with camera. Did note email in demographics, but he stated his wife would not do virtual either.  Made TELEPHONE VISIT for 01-29-19 at 0915 Due to current COVID 19 pandemic, our office is severely reducing in office visits until further notice, in order to minimize the risk to our patients and healthcare providers.  Pt understands that although there may be some limitations with this type of visit, we will take all precautions to reduce any security or privacy concerns.  Pt understands that this will be treated like an in office visit and we will file with pt's insurance, and there may be a patient responsible charge related to this service.

## 2019-01-17 NOTE — Telephone Encounter (Signed)
As per Maria Ramirez Perez, [facility], the patient's referral is now closed.  

## 2019-01-24 MED ORDER — LACOSAMIDE 200 MG PO TABS: 200.0000 mg | ORAL_TABLET | Freq: Two times a day (BID) | ORAL | 0 refills | Status: DC

## 2019-01-24 NOTE — Addendum Note (Signed)
Addended by: Kathrynn Ducking on: 01/24/2019 04:29 PM   Modules accepted: Orders

## 2019-01-24 NOTE — Telephone Encounter (Signed)
I will send in the prescription for Vimpat.

## 2019-01-24 NOTE — Telephone Encounter (Signed)
LMVM for pt to return call.  Checking if getting PAP, although no record I see?  Use local pharm? Till appt?

## 2019-01-25 ENCOUNTER — Ambulatory Visit (INDEPENDENT_AMBULATORY_CARE_PROVIDER_SITE_OTHER): Payer: Medicare Other | Admitting: Orthopedic Surgery

## 2019-01-25 ENCOUNTER — Other Ambulatory Visit: Payer: Self-pay

## 2019-01-25 ENCOUNTER — Encounter: Payer: Self-pay | Admitting: Orthopedic Surgery

## 2019-01-25 VITALS — Ht 74.0 in | Wt 239.0 lb

## 2019-01-25 DIAGNOSIS — Z89512 Acquired absence of left leg below knee: Secondary | ICD-10-CM
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
## 2019-01-27 ENCOUNTER — Telehealth: Payer: Self-pay | Admitting: *Deleted

## 2019-01-27 NOTE — Telephone Encounter (Signed)
Patient called and left voicemail message to return call. Pt will need to be notified of potential exposure during recent visit and offered testing.

## 2019-01-28 NOTE — Progress Notes (Signed)
Virtual Visit via Telephone Note  I connected with [name] on 01/28/19 at  9:15 AM EDT by telephone and verified that I am speaking with the correct person using two identifiers.   I discussed the limitations, risks, security and privacy concerns of performing an evaluation and management service by telephone and the availability of in person appointments. I also discussed with the patient that there may be a patient responsible charge related to this service. The patient expressed understanding and agreed to proceed.  History of Present Illness: 01/29/2019 SS: Mr. Steege is a 68 year old male with history of diabetes, peripheral vascular disease, seizures.  He had a single seizure event in 2017 in the setting of hypoglycemia and cocaine use.  He is supposed to be taking Vimpat 200 mg twice daily.  He reports he stopped the medication 2 months ago.  He says the medication made him have hallucinations and bad dreams.  Apparently the side effects have persisted since he started the medication.  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  He has not had any recurrent seizure events.  He says he does not use drugs.  He does not drive a car.  He denies any new neurological concerns.   12/01/2017 Dr.Willis: Mr. Goodrich is a 68 year old right-handed black male with a history of diabetes and peripheral vascular disease. The patient has had no seizures since last seen, the last seizure was in May 2017.  The patient remains on Vimpat, he is tolerating the medication well.  He is back to operating a motor vehicle.  He recently had partial amputation of some of the toes of the right foot, he has a left below-knee amputation.  The patient is still healing up from the right toe amputation.  The patient returns for routine reevaluation.   Observations/Objective: Telephone call, is alert, answers questions appropriately, speech is clear and concise   Assessment and Plan: 1.  History of seizure in 2017,in the setting of hypoglycemia and cocaine abuse  He has not had any recurrent seizures.  He has stopped taking Vimpat 2 months ago.  He reported that he had side effects of hallucinations and bad dreams since starting the medication.  I encouraged him to have a repeat EEG, he refused. We also discussed repeat MRI of the brain that was done in 2017 after the seizure event. He refused repeat imaging at this time. We will discuss this further when he follows up again in 6 months. He is not driving a car. I recommend he have EEG and MRI of the brain but he has refused. I advised him to call with any problems, concerns or recurrent seizures. We discussed that cocaine is an activator for seizures. He should avoid drug use.    Follow Up Instructions: 6 months, if he continues to refuse EEG or MRI of the brain at next visit, he can follow-up on as needed basis   I discussed the assessment and treatment plan with the patient. The patient was provided an opportunity to ask questions and all were answered. The patient agreed with the plan and demonstrated an understanding of the instructions.   The patient was advised to call back or seek an in-person evaluation if the symptoms worsen or if the condition fails to improve as anticipated.  I provided 25 minutes of non-face-to-face time during this encounter.  Butler Denmark, Laqueta Jean, DNP  [facility] [address],  [place], [place] [address] [phone]

## 2019-01-29 ENCOUNTER — Ambulatory Visit (INDEPENDENT_AMBULATORY_CARE_PROVIDER_SITE_OTHER): Payer: Medicare Other | Admitting: Neurology

## 2019-01-29 ENCOUNTER — Other Ambulatory Visit: Payer: Self-pay

## 2019-01-29 ENCOUNTER — Encounter: Payer: Self-pay | Admitting: Neurology

## 2019-01-29 DIAGNOSIS — G40909 Epilepsy, unspecified, not intractable, without status epilepticus: Secondary | ICD-10-CM | POA: Diagnosis not present

## 2019-01-29 NOTE — Progress Notes (Signed)
I have read the note, and I agree with the clinical assessment and plan.  Lorie K Allannah Kempen   

## 2019-02-03 ENCOUNTER — Other Ambulatory Visit: Payer: Self-pay | Admitting: Internal Medicine

## 2019-02-03 DIAGNOSIS — I10 Essential (primary) hypertension: Secondary | ICD-10-CM

## 2019-02-03 DIAGNOSIS — I5042 Chronic combined systolic (congestive) and diastolic (congestive) heart failure: Secondary | ICD-10-CM

## 2019-02-04 ENCOUNTER — Other Ambulatory Visit: Payer: Self-pay | Admitting: Internal Medicine

## 2019-02-04 DIAGNOSIS — I10 Essential (primary) hypertension: Secondary | ICD-10-CM

## 2019-02-08 DIAGNOSIS — S88119A Complete traumatic amputation at level between knee and ankle, unspecified lower leg, initial encounter: Secondary | ICD-10-CM | POA: Diagnosis not present

## 2019-02-08 DIAGNOSIS — M7541 Impingement syndrome of right shoulder: Secondary | ICD-10-CM | POA: Diagnosis not present

## 2019-02-08 DIAGNOSIS — N184 Chronic kidney disease, stage 4 (severe): Secondary | ICD-10-CM | POA: Diagnosis not present

## 2019-02-08 DIAGNOSIS — M7542 Impingement syndrome of left shoulder: Secondary | ICD-10-CM | POA: Diagnosis not present

## 2019-02-08 DIAGNOSIS — I5032 Chronic diastolic (congestive) heart failure: Secondary | ICD-10-CM | POA: Diagnosis not present

## 2019-02-13 ENCOUNTER — Ambulatory Visit (HOSPITAL_COMMUNITY)
Admission: RE | Admit: 2019-02-13 | Discharge: 2019-02-13 | Disposition: A | Discharge status: Home / Self Care | Payer: Medicare Other | Source: Ambulatory Visit | Attending: Nephrology | Admitting: Nephrology

## 2019-02-13 ENCOUNTER — Other Ambulatory Visit: Payer: Self-pay

## 2019-02-13 VITALS — BP 140/77 | HR 71 | Resp 20

## 2019-02-13 DIAGNOSIS — N183 Chronic kidney disease, stage 3 unspecified: Secondary | ICD-10-CM

## 2019-02-13 LAB — POCT HEMOGLOBIN-HEMACUE: Hemoglobin: 10.5 g/dL — ABNORMAL LOW (ref 13.0–17.0)

## 2019-02-13 MED ORDER — EPOETIN ALFA-EPBX 10000 UNIT/ML IJ SOLN
10000.0000 [IU] | INTRAMUSCULAR | Status: DC
  Administered 2019-02-13: 10000 [IU] via SUBCUTANEOUS
  Filled 2019-02-13: qty 1

## 2019-02-19 ENCOUNTER — Telehealth: Payer: Self-pay | Admitting: *Deleted

## 2019-02-19 ENCOUNTER — Other Ambulatory Visit: Payer: Self-pay

## 2019-02-19 ENCOUNTER — Inpatient Hospital Stay: Payer: Medicare Other | Attending: Hematology

## 2019-02-19 DIAGNOSIS — N184 Chronic kidney disease, stage 4 (severe): Secondary | ICD-10-CM | POA: Insufficient documentation

## 2019-02-19 DIAGNOSIS — D472 Monoclonal gammopathy: Secondary | ICD-10-CM

## 2019-02-19 DIAGNOSIS — E1122 Type 2 diabetes mellitus with diabetic chronic kidney disease: Secondary | ICD-10-CM | POA: Diagnosis not present

## 2019-02-19 DIAGNOSIS — D509 Iron deficiency anemia, unspecified: Secondary | ICD-10-CM | POA: Diagnosis not present

## 2019-02-19 DIAGNOSIS — D638 Anemia in other chronic diseases classified elsewhere: Secondary | ICD-10-CM | POA: Diagnosis not present

## 2019-02-19 DIAGNOSIS — I509 Heart failure, unspecified: Secondary | ICD-10-CM | POA: Insufficient documentation

## 2019-02-19 DIAGNOSIS — I130 Hypertensive heart and chronic kidney disease with heart failure and stage 1 through stage 4 chronic kidney disease, or unspecified chronic kidney disease: Secondary | ICD-10-CM | POA: Diagnosis not present

## 2019-02-19 LAB — CBC WITH DIFFERENTIAL (CANCER CENTER ONLY)
Abs Immature Granulocytes: 0.01 10*3/uL (ref 0.00–0.07)
Basophils Absolute: 0 10*3/uL (ref 0.0–0.1)
Basophils Relative: 1 %
Eosinophils Absolute: 0.4 10*3/uL (ref 0.0–0.5)
Eosinophils Relative: 6 %
HCT: 30.5 % — ABNORMAL LOW (ref 39.0–52.0)
Hemoglobin: 9.5 g/dL — ABNORMAL LOW (ref 13.0–17.0)
Immature Granulocytes: 0 %
Lymphocytes Relative: 34 %
Lymphs Abs: 2.6 10*3/uL (ref 0.7–4.0)
MCH: 29.8 pg (ref 26.0–34.0)
MCHC: 31.1 g/dL (ref 30.0–36.0)
MCV: 95.6 fL (ref 80.0–100.0)
Monocytes Absolute: 0.6 10*3/uL (ref 0.1–1.0)
Monocytes Relative: 8 %
Neutro Abs: 3.9 10*3/uL (ref 1.7–7.7)
Neutrophils Relative %: 51 %
Platelet Count: 322 10*3/uL (ref 150–400)
RBC: 3.19 MIL/uL — ABNORMAL LOW (ref 4.22–5.81)
RDW: 15.1 % (ref 11.5–15.5)
WBC Count: 7.5 10*3/uL (ref 4.0–10.5)
nRBC: 0.8 % — ABNORMAL HIGH (ref 0.0–0.2)

## 2019-02-19 LAB — CMP (CANCER CENTER ONLY)
ALT: 11 U/L (ref 0–44)
AST: 9 U/L — ABNORMAL LOW (ref 15–41)
Albumin: 3.6 g/dL (ref 3.5–5.0)
Alkaline Phosphatase: 79 U/L (ref 38–126)
Anion gap: 7 (ref 5–15)
BUN: 71 mg/dL — ABNORMAL HIGH (ref 8–23)
CO2: 15 mmol/L — ABNORMAL LOW (ref 22–32)
Calcium: 8.9 mg/dL (ref 8.9–10.3)
Chloride: 119 mmol/L — ABNORMAL HIGH (ref 98–111)
Creatinine: 5.27 mg/dL (ref 0.61–1.24)
GFR, Est AFR Am: 12 mL/min — ABNORMAL LOW (ref >60)
GFR, Estimated: 10 mL/min — ABNORMAL LOW (ref >60)
Glucose, Bld: 118 mg/dL — ABNORMAL HIGH (ref 70–99)
Potassium: 5.4 mmol/L — ABNORMAL HIGH (ref 3.5–5.1)
Sodium: 141 mmol/L (ref 135–145)
Total Bilirubin: 0.2 mg/dL — ABNORMAL LOW (ref 0.3–1.2)
Total Protein: 7.9 g/dL (ref 6.5–8.1)

## 2019-02-19 NOTE — Telephone Encounter (Signed)
Received call report from lab with today's Creat = 5.27.  Secure Chat sent with results.

## 2019-02-20 LAB — MULTIPLE MYELOMA PANEL, SERUM
Albumin SerPl Elph-Mcnc: 3.7 g/dL (ref 2.9–4.4)
Albumin/Glob SerPl: 1.1 (ref 0.7–1.7)
Alpha 1: 0.2 g/dL (ref 0.0–0.4)
Alpha2 Glob SerPl Elph-Mcnc: 0.8 g/dL (ref 0.4–1.0)
B-Globulin SerPl Elph-Mcnc: 0.8 g/dL (ref 0.7–1.3)
Gamma Glob SerPl Elph-Mcnc: 1.8 g/dL (ref 0.4–1.8)
Globulin, Total: 3.6 g/dL (ref 2.2–3.9)
IgA: 204 mg/dL (ref 61–437)
IgG (Immunoglobin G), Serum: 1882 mg/dL — ABNORMAL HIGH (ref 603–1613)
IgM (Immunoglobulin M), Srm: 327 mg/dL — ABNORMAL HIGH (ref 20–172)
M Protein SerPl Elph-Mcnc: 0.6 g/dL — ABNORMAL HIGH
Total Protein ELP: 7.3 g/dL (ref 6.0–8.5)

## 2019-02-20 LAB — KAPPA/LAMBDA LIGHT CHAINS
Kappa free light chain: 181.4 mg/L — ABNORMAL HIGH (ref 3.3–19.4)
Kappa, lambda light chain ratio: 3.04 — ABNORMAL HIGH (ref 0.26–1.65)
Lambda free light chains: 59.7 mg/L — ABNORMAL HIGH (ref 5.7–26.3)
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
## 2019-02-26 ENCOUNTER — Other Ambulatory Visit: Payer: Self-pay

## 2019-02-26 ENCOUNTER — Inpatient Hospital Stay (HOSPITAL_BASED_OUTPATIENT_CLINIC_OR_DEPARTMENT_OTHER): Payer: Medicare Other | Admitting: Hematology

## 2019-02-26 ENCOUNTER — Telehealth: Payer: Self-pay | Admitting: Hematology

## 2019-02-26 VITALS — BP 138/70 | HR 62 | Temp 99.2°F | Resp 17 | Ht 74.0 in

## 2019-02-26 DIAGNOSIS — D638 Anemia in other chronic diseases classified elsewhere: Secondary | ICD-10-CM | POA: Diagnosis not present

## 2019-02-26 DIAGNOSIS — E1122 Type 2 diabetes mellitus with diabetic chronic kidney disease: Secondary | ICD-10-CM | POA: Diagnosis not present

## 2019-02-26 DIAGNOSIS — I509 Heart failure, unspecified: Secondary | ICD-10-CM | POA: Diagnosis not present

## 2019-02-26 DIAGNOSIS — I130 Hypertensive heart and chronic kidney disease with heart failure and stage 1 through stage 4 chronic kidney disease, or unspecified chronic kidney disease: Secondary | ICD-10-CM | POA: Diagnosis not present

## 2019-02-26 DIAGNOSIS — N184 Chronic kidney disease, stage 4 (severe): Secondary | ICD-10-CM | POA: Diagnosis not present

## 2019-02-26 DIAGNOSIS — D472 Monoclonal gammopathy: Secondary | ICD-10-CM | POA: Diagnosis not present

## 2019-02-26 DIAGNOSIS — N185 Chronic kidney disease, stage 5: Secondary | ICD-10-CM

## 2019-02-26 DIAGNOSIS — I5032 Chronic diastolic (congestive) heart failure: Secondary | ICD-10-CM

## 2019-02-26 DIAGNOSIS — D509 Iron deficiency anemia, unspecified: Secondary | ICD-10-CM

## 2019-02-26 NOTE — Telephone Encounter (Signed)
Scheduled appt per 6/29 los. Printed and mailed calendar. °

## 2019-02-27 ENCOUNTER — Encounter: Payer: Self-pay | Admitting: Hematology

## 2019-02-28 ENCOUNTER — Other Ambulatory Visit: Payer: Self-pay

## 2019-02-28 DIAGNOSIS — Z89512 Acquired absence of left leg below knee: Secondary | ICD-10-CM

## 2019-03-07 DIAGNOSIS — Z89512 Acquired absence of left leg below knee: Secondary | ICD-10-CM | POA: Diagnosis not present

## 2019-03-13 ENCOUNTER — Other Ambulatory Visit: Payer: Self-pay

## 2019-03-13 ENCOUNTER — Ambulatory Visit (HOSPITAL_COMMUNITY)
Admission: RE | Admit: 2019-03-13 | Discharge: 2019-03-13 | Disposition: A | Discharge status: Home / Self Care | Payer: Medicare Other | Source: Ambulatory Visit | Attending: Nephrology | Admitting: Nephrology

## 2019-03-13 VITALS — BP 141/74 | HR 81 | Temp 98.0°F | Resp 20

## 2019-03-13 DIAGNOSIS — N183 Chronic kidney disease, stage 3 unspecified: Secondary | ICD-10-CM

## 2019-03-13 DIAGNOSIS — D631 Anemia in chronic kidney disease: Secondary | ICD-10-CM | POA: Insufficient documentation

## 2019-03-13 LAB — COMPREHENSIVE METABOLIC PANEL
ALT: 12 U/L (ref 0–44)
AST: 9 U/L — ABNORMAL LOW (ref 15–41)
Albumin: 3.5 g/dL (ref 3.5–5.0)
Alkaline Phosphatase: 77 U/L (ref 38–126)
Anion gap: 10 (ref 5–15)
BUN: 75 mg/dL — ABNORMAL HIGH (ref 8–23)
CO2: 13 mmol/L — ABNORMAL LOW (ref 22–32)
Calcium: 9 mg/dL (ref 8.9–10.3)
Chloride: 117 mmol/L — ABNORMAL HIGH (ref 98–111)
Creatinine, Ser: 5.63 mg/dL — ABNORMAL HIGH (ref 0.61–1.24)
GFR calc Af Amer: 11 mL/min — ABNORMAL LOW (ref >60)
GFR calc non Af Amer: 10 mL/min — ABNORMAL LOW (ref >60)
Glucose, Bld: 206 mg/dL — ABNORMAL HIGH (ref 70–99)
Potassium: 5.1 mmol/L (ref 3.5–5.1)
Sodium: 140 mmol/L (ref 135–145)
Total Bilirubin: 0.4 mg/dL (ref 0.3–1.2)
Total Protein: 7.6 g/dL (ref 6.5–8.1)

## 2019-03-13 LAB — POCT HEMOGLOBIN-HEMACUE: Hemoglobin: 9.6 g/dL — ABNORMAL LOW (ref 13.0–17.0)

## 2019-03-13 LAB — PHOSPHORUS: Phosphorus: 4.3 mg/dL (ref 2.5–4.6)

## 2019-03-13 LAB — IRON AND TIBC
Iron: 65 ug/dL (ref 45–182)
Saturation Ratios: 32 % (ref 17.9–39.5)
TIBC: 200 ug/dL — ABNORMAL LOW (ref 250–450)
UIBC: 135 ug/dL

## 2019-03-13 LAB — FERRITIN: Ferritin: 263 ng/mL (ref 24–336)

## 2019-03-13 MED ORDER — EPOETIN ALFA-EPBX 10000 UNIT/ML IJ SOLN
10000.0000 [IU] | INTRAMUSCULAR | Status: DC
  Administered 2019-03-13: 10000 [IU] via SUBCUTANEOUS
  Filled 2019-03-13: qty 1

## 2019-03-22 ENCOUNTER — Telehealth: Payer: Self-pay | Admitting: Internal Medicine

## 2019-03-22 DIAGNOSIS — Z794 Long term (current) use of insulin: Secondary | ICD-10-CM

## 2019-03-22 DIAGNOSIS — E118 Type 2 diabetes mellitus with unspecified complications: Secondary | ICD-10-CM

## 2019-03-22 NOTE — Telephone Encounter (Signed)
Could you take care of this please

## 2019-03-22 NOTE — Telephone Encounter (Signed)
States he needs another blood sugar meter stating he has had it for five years. Please mail to pt home per pt request. Verified address and phone number for pt.

## 2019-03-23 MED ORDER — ONETOUCH DELICA LANCETS 33G MISC: 12 refills | Status: DC

## 2019-03-23 MED ORDER — ONETOUCH VERIO W/DEVICE KIT: PACK | 0 refills | Status: AC

## 2019-03-23 MED ORDER — ONETOUCH VERIO VI STRP: ORAL_STRIP | 12 refills | Status: DC

## 2019-04-10 ENCOUNTER — Other Ambulatory Visit: Payer: Self-pay

## 2019-04-10 ENCOUNTER — Telehealth: Payer: Self-pay | Admitting: Orthopedic Surgery

## 2019-04-10 ENCOUNTER — Ambulatory Visit (HOSPITAL_COMMUNITY)
Admission: RE | Admit: 2019-04-10 | Discharge: 2019-04-10 | Disposition: A | Discharge status: Home / Self Care | Payer: Medicare Other | Source: Ambulatory Visit | Attending: Nephrology | Admitting: Nephrology

## 2019-04-10 VITALS — BP 142/70 | HR 80 | Temp 97.4°F

## 2019-04-10 DIAGNOSIS — N183 Chronic kidney disease, stage 3 unspecified: Secondary | ICD-10-CM

## 2019-04-10 LAB — POCT HEMOGLOBIN-HEMACUE: Hemoglobin: 9.4 g/dL — ABNORMAL LOW (ref 13.0–17.0)

## 2019-04-10 MED ORDER — EPOETIN ALFA-EPBX 10000 UNIT/ML IJ SOLN
10000.0000 [IU] | INTRAMUSCULAR | Status: DC
  Administered 2019-04-10: 10000 [IU] via SUBCUTANEOUS
  Filled 2019-04-10: qty 1

## 2019-04-10 NOTE — Telephone Encounter (Signed)
Received completed and awaiting signature on Thursday.

## 2019-04-10 NOTE — Telephone Encounter (Signed)
Patient called advised he is on his was to bring a form to be completed for a handicap placard. The number if needed to contact patient is [phone]

## 2019-04-13 NOTE — Telephone Encounter (Signed)
Patient called wanting to know the status of his handicap placard.  Thank you.

## 2019-04-13 NOTE — Telephone Encounter (Signed)
Called pt to advise tht was put in the mail today.  Lm on vm

## 2019-04-17 ENCOUNTER — Telehealth: Payer: Self-pay | Admitting: Orthopedic Surgery

## 2019-04-17 NOTE — Telephone Encounter (Signed)
Patient called advised he still do not have the handicap placard and he's suppose to go to [employer]. The number to contact patient is [phone]

## 2019-04-17 NOTE — Telephone Encounter (Signed)
Called and sw pt to advise that I did not know why he didn't receive the form in the mail but I have filled out another one and it is at the front desk for pick up whenever he has time.

## 2019-04-29 ENCOUNTER — Other Ambulatory Visit: Payer: Self-pay | Admitting: Internal Medicine

## 2019-04-29 DIAGNOSIS — I5042 Chronic combined systolic (congestive) and diastolic (congestive) heart failure: Secondary | ICD-10-CM

## 2019-04-29 DIAGNOSIS — I10 Essential (primary) hypertension: Secondary | ICD-10-CM

## 2019-05-08 ENCOUNTER — Other Ambulatory Visit: Payer: Self-pay

## 2019-05-08 ENCOUNTER — Ambulatory Visit (HOSPITAL_COMMUNITY)
Admission: RE | Admit: 2019-05-08 | Discharge: 2019-05-08 | Disposition: A | Discharge status: Home / Self Care | Payer: Medicare Other | Source: Ambulatory Visit | Attending: Nephrology | Admitting: Nephrology

## 2019-05-08 VITALS — BP 159/78 | HR 68 | Temp 97.2°F | Resp 20

## 2019-05-08 DIAGNOSIS — N183 Chronic kidney disease, stage 3 unspecified: Secondary | ICD-10-CM

## 2019-05-08 DIAGNOSIS — D631 Anemia in chronic kidney disease: Secondary | ICD-10-CM | POA: Insufficient documentation

## 2019-05-08 LAB — COMPREHENSIVE METABOLIC PANEL
ALT: 11 U/L (ref 0–44)
AST: 11 U/L — ABNORMAL LOW (ref 15–41)
Albumin: 3.5 g/dL (ref 3.5–5.0)
Alkaline Phosphatase: 70 U/L (ref 38–126)
Anion gap: 8 (ref 5–15)
BUN: 70 mg/dL — ABNORMAL HIGH (ref 8–23)
CO2: 15 mmol/L — ABNORMAL LOW (ref 22–32)
Calcium: 8.8 mg/dL — ABNORMAL LOW (ref 8.9–10.3)
Chloride: 119 mmol/L — ABNORMAL HIGH (ref 98–111)
Creatinine, Ser: 5.03 mg/dL — ABNORMAL HIGH (ref 0.61–1.24)
GFR calc Af Amer: 13 mL/min — ABNORMAL LOW (ref >60)
GFR calc non Af Amer: 11 mL/min — ABNORMAL LOW (ref >60)
Glucose, Bld: 205 mg/dL — ABNORMAL HIGH (ref 70–99)
Potassium: 4.7 mmol/L (ref 3.5–5.1)
Sodium: 142 mmol/L (ref 135–145)
Total Bilirubin: 0.5 mg/dL (ref 0.3–1.2)
Total Protein: 7.9 g/dL (ref 6.5–8.1)

## 2019-05-08 LAB — IRON AND TIBC
Iron: 62 ug/dL (ref 45–182)
Saturation Ratios: 30 % (ref 17.9–39.5)
TIBC: 204 ug/dL — ABNORMAL LOW (ref 250–450)
UIBC: 142 ug/dL

## 2019-05-08 LAB — FERRITIN: Ferritin: 219 ng/mL (ref 24–336)

## 2019-05-08 LAB — PHOSPHORUS: Phosphorus: 4 mg/dL (ref 2.5–4.6)

## 2019-05-08 LAB — POCT HEMOGLOBIN-HEMACUE: Hemoglobin: 9.7 g/dL — ABNORMAL LOW (ref 13.0–17.0)

## 2019-05-08 MED ORDER — EPOETIN ALFA-EPBX 10000 UNIT/ML IJ SOLN
10000.0000 [IU] | INTRAMUSCULAR | Status: DC
  Administered 2019-05-08: 10000 [IU] via SUBCUTANEOUS
  Filled 2019-05-08: qty 1

## 2019-05-15 ENCOUNTER — Other Ambulatory Visit: Payer: Self-pay | Admitting: Internal Medicine

## 2019-05-15 DIAGNOSIS — I10 Essential (primary) hypertension: Secondary | ICD-10-CM

## 2019-05-17 ENCOUNTER — Ambulatory Visit (INDEPENDENT_AMBULATORY_CARE_PROVIDER_SITE_OTHER): Payer: Medicare Other

## 2019-05-17 ENCOUNTER — Ambulatory Visit (INDEPENDENT_AMBULATORY_CARE_PROVIDER_SITE_OTHER): Payer: Medicare Other | Admitting: Orthopedic Surgery

## 2019-05-17 ENCOUNTER — Other Ambulatory Visit: Payer: Self-pay

## 2019-05-17 ENCOUNTER — Encounter: Payer: Self-pay | Admitting: Orthopedic Surgery

## 2019-05-17 VITALS — Ht 74.0 in | Wt 239.0 lb

## 2019-05-17 DIAGNOSIS — Z89512 Acquired absence of left leg below knee: Secondary | ICD-10-CM

## 2019-05-17 DIAGNOSIS — M79671 Pain in right foot: Secondary | ICD-10-CM | POA: Diagnosis not present

## 2019-05-19 ENCOUNTER — Encounter: Payer: Self-pay | Admitting: Orthopedic Surgery
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
## 2019-05-21 ENCOUNTER — Other Ambulatory Visit: Payer: Self-pay | Admitting: Internal Medicine

## 2019-05-21 DIAGNOSIS — E118 Type 2 diabetes mellitus with unspecified complications: Secondary | ICD-10-CM

## 2019-05-21 DIAGNOSIS — Z794 Long term (current) use of insulin: Secondary | ICD-10-CM

## 2019-06-05 ENCOUNTER — Ambulatory Visit: Payer: Medicare Other | Admitting: Internal Medicine

## 2019-06-05 ENCOUNTER — Ambulatory Visit (HOSPITAL_COMMUNITY)
Admission: RE | Admit: 2019-06-05 | Discharge: 2019-06-05 | Disposition: A | Discharge status: Home / Self Care | Payer: Medicare Other | Source: Ambulatory Visit | Attending: Nephrology | Admitting: Nephrology

## 2019-06-05 ENCOUNTER — Other Ambulatory Visit: Payer: Self-pay

## 2019-06-05 DIAGNOSIS — D631 Anemia in chronic kidney disease: Secondary | ICD-10-CM | POA: Insufficient documentation

## 2019-06-05 DIAGNOSIS — N184 Chronic kidney disease, stage 4 (severe): Secondary | ICD-10-CM | POA: Diagnosis not present

## 2019-06-05 LAB — POCT HEMOGLOBIN-HEMACUE: Hemoglobin: 9.6 g/dL — ABNORMAL LOW (ref 13.0–17.0)

## 2019-06-05 MED ORDER — EPOETIN ALFA-EPBX 10000 UNIT/ML IJ SOLN
10000.0000 [IU] | INTRAMUSCULAR | Status: DC
  Administered 2019-06-05: 10000 [IU] via SUBCUTANEOUS
  Filled 2019-06-05: qty 1

## 2019-06-06 ENCOUNTER — Other Ambulatory Visit: Payer: Self-pay | Admitting: Internal Medicine

## 2019-06-06 DIAGNOSIS — E118 Type 2 diabetes mellitus with unspecified complications: Secondary | ICD-10-CM

## 2019-06-06 DIAGNOSIS — Z794 Long term (current) use of insulin: Secondary | ICD-10-CM

## 2019-06-07 DIAGNOSIS — Z89512 Acquired absence of left leg below knee: Secondary | ICD-10-CM | POA: Diagnosis not present

## 2019-06-07 DIAGNOSIS — L602 Onychogryphosis: Secondary | ICD-10-CM | POA: Diagnosis not present

## 2019-06-07 DIAGNOSIS — L97519 Non-pressure chronic ulcer of other part of right foot with unspecified severity: Secondary | ICD-10-CM | POA: Diagnosis not present

## 2019-06-07 DIAGNOSIS — L84 Corns and callosities: Secondary | ICD-10-CM | POA: Diagnosis not present

## 2019-06-07 DIAGNOSIS — E1351 Other specified diabetes mellitus with diabetic peripheral angiopathy without gangrene: Secondary | ICD-10-CM | POA: Diagnosis not present

## 2019-06-21 DIAGNOSIS — L97519 Non-pressure chronic ulcer of other part of right foot with unspecified severity: Secondary | ICD-10-CM | POA: Diagnosis not present

## 2019-06-21 DIAGNOSIS — E1351 Other specified diabetes mellitus with diabetic peripheral angiopathy without gangrene: Secondary | ICD-10-CM | POA: Diagnosis not present

## 2019-06-21 DIAGNOSIS — Z89512 Acquired absence of left leg below knee: Secondary | ICD-10-CM | POA: Diagnosis not present

## 2019-06-22 ENCOUNTER — Ambulatory Visit (HOSPITAL_BASED_OUTPATIENT_CLINIC_OR_DEPARTMENT_OTHER): Payer: Medicare Other | Admitting: Pharmacist

## 2019-06-22 ENCOUNTER — Other Ambulatory Visit: Payer: Self-pay

## 2019-06-22 ENCOUNTER — Ambulatory Visit: Payer: Medicare Other | Attending: Internal Medicine | Admitting: Internal Medicine

## 2019-06-22 ENCOUNTER — Encounter: Payer: Self-pay | Admitting: Internal Medicine

## 2019-06-22 VITALS — BP 158/81 | HR 88 | Temp 99.3°F | Resp 16 | Wt 242.2 lb

## 2019-06-22 DIAGNOSIS — E1122 Type 2 diabetes mellitus with diabetic chronic kidney disease: Secondary | ICD-10-CM | POA: Diagnosis not present

## 2019-06-22 DIAGNOSIS — I10 Essential (primary) hypertension: Secondary | ICD-10-CM

## 2019-06-22 DIAGNOSIS — Z23 Encounter for immunization: Secondary | ICD-10-CM | POA: Diagnosis not present

## 2019-06-22 DIAGNOSIS — Z794 Long term (current) use of insulin: Secondary | ICD-10-CM

## 2019-06-22 DIAGNOSIS — I5042 Chronic combined systolic (congestive) and diastolic (congestive) heart failure: Secondary | ICD-10-CM

## 2019-06-22 DIAGNOSIS — D509 Iron deficiency anemia, unspecified: Secondary | ICD-10-CM

## 2019-06-22 DIAGNOSIS — D631 Anemia in chronic kidney disease: Secondary | ICD-10-CM

## 2019-06-22 DIAGNOSIS — D472 Monoclonal gammopathy: Secondary | ICD-10-CM

## 2019-06-22 DIAGNOSIS — N185 Chronic kidney disease, stage 5: Secondary | ICD-10-CM | POA: Diagnosis not present

## 2019-06-22 DIAGNOSIS — G40909 Epilepsy, unspecified, not intractable, without status epilepticus: Secondary | ICD-10-CM

## 2019-06-22 LAB — POCT GLYCOSYLATED HEMOGLOBIN (HGB A1C): HbA1c, POC (prediabetic range): 6 % (ref 5.7–6.4)

## 2019-06-22 LAB — GLUCOSE, POCT (MANUAL RESULT ENTRY): POC Glucose: 204 mg/dl — AB (ref 70–99)

## 2019-06-22 MED ORDER — CARVEDILOL 12.5 MG PO TABS: ORAL_TABLET | ORAL | 6 refills | Status: DC
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
## 2019-06-22 NOTE — Progress Notes (Signed)
Patient presents for vaccination against influenza per orders of Dr. Johnson. Consent given. Counseling provided. No contraindications exists. Vaccine administered without incident.   

## 2019-06-22 NOTE — Patient Instructions (Signed)
Influenza Virus Vaccine injection (Fluarix) What is this medicine? INFLUENZA VIRUS VACCINE (in floo EN zuh VAHY ruhs vak SEEN) helps to reduce the risk of getting influenza also known as the flu. This medicine may be used for other purposes; ask your health care provider or pharmacist if you have questions. COMMON BRAND NAME(S): Fluarix, Fluzone What should I tell my health care provider before I take this medicine? They need to know if you have any of these conditions:  bleeding disorder like hemophilia  fever or infection  Guillain-Barre syndrome or other neurological problems  immune system problems  infection with the human immunodeficiency virus (HIV) or AIDS  low blood platelet counts  multiple sclerosis  an unusual or allergic reaction to influenza virus vaccine, eggs, chicken proteins, latex, gentamicin, other medicines, foods, dyes or preservatives  pregnant or trying to get pregnant  breast-feeding How should I use this medicine? This vaccine is for injection into a muscle. It is given by a health care professional. A copy of Vaccine Information Statements will be given before each vaccination. Read this sheet carefully each time. The sheet may change frequently. Talk to your pediatrician regarding the use of this medicine in children. Special care may be needed. Overdosage: If you think you have taken too much of this medicine contact a poison control center or emergency room at once. NOTE: This medicine is only for you. Do not share this medicine with others. What if I miss a dose? This does not apply. What may interact with this medicine?  chemotherapy or radiation therapy  medicines that lower your immune system like etanercept, anakinra, infliximab, and adalimumab  medicines that treat or prevent blood clots like warfarin  phenytoin  steroid medicines like prednisone or cortisone  theophylline  vaccines This list may not describe all possible  interactions. Give your health care provider a list of all the medicines, herbs, non-prescription drugs, or dietary supplements you use. Also tell them if you smoke, drink alcohol, or use illegal drugs. Some items may interact with your medicine. What should I watch for while using this medicine? Report any side effects that do not go away within 3 days to your doctor or health care professional. Call your health care provider if any unusual symptoms occur within 6 weeks of receiving this vaccine. You may still catch the flu, but the illness is not usually as bad. You cannot get the flu from the vaccine. The vaccine will not protect against colds or other illnesses that may cause fever. The vaccine is needed every year. What side effects may I notice from receiving this medicine? Side effects that you should report to your doctor or health care professional as soon as possible:  allergic reactions like skin rash, itching or hives, swelling of the face, lips, or tongue Side effects that usually do not require medical attention (report to your doctor or health care professional if they continue or are bothersome):  fever  headache  muscle aches and pains  pain, tenderness, redness, or swelling at site where injected  weak or tired This list may not describe all possible side effects. Call your doctor for medical advice about side effects. You may report side effects to FDA at 1-800-FDA-1088. Where should I keep my medicine? This vaccine is only given in a clinic, pharmacy, doctor's office, or other health care setting and will not be stored at home. NOTE: This sheet is a summary. It may not cover all possible information. If you have questions   about this medicine, talk to your doctor, pharmacist, or health care provider.  2020 Elsevier/Gold Standard (2008-03-13 09:30:40)  

## 2019-07-03 ENCOUNTER — Encounter (HOSPITAL_COMMUNITY): Payer: Medicare Other

## 2019-07-05 DIAGNOSIS — E1351 Other specified diabetes mellitus with diabetic peripheral angiopathy without gangrene: Secondary | ICD-10-CM | POA: Diagnosis not present

## 2019-07-05 DIAGNOSIS — L97519 Non-pressure chronic ulcer of other part of right foot with unspecified severity: Secondary | ICD-10-CM | POA: Diagnosis not present

## 2019-07-05 DIAGNOSIS — Z89512 Acquired absence of left leg below knee: Secondary | ICD-10-CM | POA: Diagnosis not present

## 2019-07-19 ENCOUNTER — Other Ambulatory Visit: Payer: Self-pay | Admitting: Internal Medicine

## 2019-07-19 DIAGNOSIS — Z89512 Acquired absence of left leg below knee: Secondary | ICD-10-CM | POA: Diagnosis not present

## 2019-07-19 DIAGNOSIS — I5042 Chronic combined systolic (congestive) and diastolic (congestive) heart failure: Secondary | ICD-10-CM

## 2019-07-19 DIAGNOSIS — I739 Peripheral vascular disease, unspecified: Secondary | ICD-10-CM | POA: Diagnosis not present

## 2019-07-19 DIAGNOSIS — I10 Essential (primary) hypertension: Secondary | ICD-10-CM

## 2019-07-19 DIAGNOSIS — L97519 Non-pressure chronic ulcer of other part of right foot with unspecified severity: Secondary | ICD-10-CM | POA: Diagnosis not present

## 2019-07-19 DIAGNOSIS — E1351 Other specified diabetes mellitus with diabetic peripheral angiopathy without gangrene: Secondary | ICD-10-CM | POA: Diagnosis not present

## 2019-07-30 ENCOUNTER — Other Ambulatory Visit (HOSPITAL_COMMUNITY): Payer: Self-pay | Admitting: Podiatry

## 2019-07-30 DIAGNOSIS — I739 Peripheral vascular disease, unspecified: Secondary | ICD-10-CM

## 2019-07-31 ENCOUNTER — Encounter (HOSPITAL_COMMUNITY): Payer: Medicare Other

## 2019-08-01 ENCOUNTER — Other Ambulatory Visit: Payer: Self-pay

## 2019-08-01 ENCOUNTER — Ambulatory Visit (HOSPITAL_COMMUNITY)
Admission: RE | Admit: 2019-08-01 | Discharge: 2019-08-01 | Disposition: A | Discharge status: Home / Self Care | Payer: Medicare Other | Source: Ambulatory Visit | Attending: Cardiovascular Disease | Admitting: Cardiovascular Disease

## 2019-08-01 DIAGNOSIS — I739 Peripheral vascular disease, unspecified: Secondary | ICD-10-CM | POA: Diagnosis not present

## 2019-08-02 DIAGNOSIS — L97519 Non-pressure chronic ulcer of other part of right foot with unspecified severity: Secondary | ICD-10-CM | POA: Diagnosis not present

## 2019-08-02 DIAGNOSIS — E1351 Other specified diabetes mellitus with diabetic peripheral angiopathy without gangrene: Secondary | ICD-10-CM | POA: Diagnosis not present

## 2019-08-02 DIAGNOSIS — I739 Peripheral vascular disease, unspecified: Secondary | ICD-10-CM | POA: Diagnosis not present

## 2019-08-02 DIAGNOSIS — Z89512 Acquired absence of left leg below knee: Secondary | ICD-10-CM | POA: Diagnosis not present

## 2019-08-10 ENCOUNTER — Other Ambulatory Visit: Payer: Self-pay

## 2019-08-10 ENCOUNTER — Ambulatory Visit (HOSPITAL_COMMUNITY)
Admission: RE | Admit: 2019-08-10 | Discharge: 2019-08-10 | Disposition: A | Discharge status: Home / Self Care | Payer: Medicare Other | Source: Ambulatory Visit | Attending: Nephrology | Admitting: Nephrology

## 2019-08-10 DIAGNOSIS — D631 Anemia in chronic kidney disease: Secondary | ICD-10-CM | POA: Diagnosis not present

## 2019-08-10 DIAGNOSIS — N189 Chronic kidney disease, unspecified: Secondary | ICD-10-CM | POA: Diagnosis not present

## 2019-08-10 LAB — FERRITIN: Ferritin: 252 ng/mL (ref 24–336)

## 2019-08-10 LAB — POCT HEMOGLOBIN-HEMACUE: Hemoglobin: 9.3 g/dL — ABNORMAL LOW (ref 13.0–17.0)

## 2019-08-10 LAB — IRON AND TIBC
Iron: 48 ug/dL (ref 45–182)
Saturation Ratios: 24 % (ref 17.9–39.5)
TIBC: 200 ug/dL — ABNORMAL LOW (ref 250–450)
UIBC: 152 ug/dL

## 2019-08-10 MED ORDER — EPOETIN ALFA-EPBX 10000 UNIT/ML IJ SOLN
10000.0000 [IU] | INTRAMUSCULAR | Status: DC
  Administered 2019-08-10: 10000 [IU] via SUBCUTANEOUS

## 2019-08-10 MED ORDER — EPOETIN ALFA-EPBX 10000 UNIT/ML IJ SOLN
INTRAMUSCULAR | Status: AC
  Filled 2019-08-10: qty 1

## 2019-08-16 DIAGNOSIS — I739 Peripheral vascular disease, unspecified: Secondary | ICD-10-CM | POA: Diagnosis not present

## 2019-08-16 DIAGNOSIS — Z89512 Acquired absence of left leg below knee: Secondary | ICD-10-CM | POA: Diagnosis not present

## 2019-08-16 DIAGNOSIS — L602 Onychogryphosis: Secondary | ICD-10-CM | POA: Diagnosis not present

## 2019-08-16 DIAGNOSIS — L84 Corns and callosities: Secondary | ICD-10-CM | POA: Diagnosis not present

## 2019-08-16 DIAGNOSIS — L97519 Non-pressure chronic ulcer of other part of right foot with unspecified severity: Secondary | ICD-10-CM | POA: Diagnosis not present

## 2019-08-28 ENCOUNTER — Inpatient Hospital Stay: Payer: Medicare Other | Attending: Hematology

## 2019-08-28 ENCOUNTER — Other Ambulatory Visit: Payer: Self-pay

## 2019-08-28 DIAGNOSIS — D472 Monoclonal gammopathy: Secondary | ICD-10-CM

## 2019-08-28 LAB — CMP (CANCER CENTER ONLY)
ALT: 11 U/L (ref 0–44)
AST: 13 U/L — ABNORMAL LOW (ref 15–41)
Albumin: 3.6 g/dL (ref 3.5–5.0)
Alkaline Phosphatase: 95 U/L (ref 38–126)
Anion gap: 11 (ref 5–15)
BUN: 66 mg/dL — ABNORMAL HIGH (ref 8–23)
CO2: 14 mmol/L — ABNORMAL LOW (ref 22–32)
Calcium: 8.7 mg/dL — ABNORMAL LOW (ref 8.9–10.3)
Chloride: 121 mmol/L — ABNORMAL HIGH (ref 98–111)
Creatinine: 5.24 mg/dL (ref 0.61–1.24)
GFR, Est AFR Am: 12 mL/min — ABNORMAL LOW (ref >60)
GFR, Estimated: 10 mL/min — ABNORMAL LOW (ref >60)
Glucose, Bld: 127 mg/dL — ABNORMAL HIGH (ref 70–99)
Potassium: 5 mmol/L (ref 3.5–5.1)
Sodium: 146 mmol/L — ABNORMAL HIGH (ref 135–145)
Total Bilirubin: <0.2 mg/dL — ABNORMAL LOW (ref 0.3–1.2)
Total Protein: 7.9 g/dL (ref 6.5–8.1)

## 2019-08-28 LAB — CBC WITH DIFFERENTIAL (CANCER CENTER ONLY)
Abs Immature Granulocytes: 0.02 10*3/uL (ref 0.00–0.07)
Basophils Absolute: 0.1 10*3/uL (ref 0.0–0.1)
Basophils Relative: 1 %
Eosinophils Absolute: 0.5 10*3/uL (ref 0.0–0.5)
Eosinophils Relative: 6 %
HCT: 29.6 % — ABNORMAL LOW (ref 39.0–52.0)
Hemoglobin: 9 g/dL — ABNORMAL LOW (ref 13.0–17.0)
Immature Granulocytes: 0 %
Lymphocytes Relative: 37 %
Lymphs Abs: 2.7 10*3/uL (ref 0.7–4.0)
MCH: 30 pg (ref 26.0–34.0)
MCHC: 30.4 g/dL (ref 30.0–36.0)
MCV: 98.7 fL (ref 80.0–100.0)
Monocytes Absolute: 0.6 10*3/uL (ref 0.1–1.0)
Monocytes Relative: 8 %
Neutro Abs: 3.4 10*3/uL (ref 1.7–7.7)
Neutrophils Relative %: 48 %
Platelet Count: 289 10*3/uL (ref 150–400)
RBC: 3 MIL/uL — ABNORMAL LOW (ref 4.22–5.81)
RDW: 14.9 % (ref 11.5–15.5)
WBC Count: 7.1 10*3/uL (ref 4.0–10.5)
nRBC: 0 % (ref 0.0–0.2)

## 2019-08-29 LAB — MULTIPLE MYELOMA PANEL, SERUM
Albumin SerPl Elph-Mcnc: 3.4 g/dL (ref 2.9–4.4)
Albumin/Glob SerPl: 1 (ref 0.7–1.7)
Alpha 1: 0.2 g/dL (ref 0.0–0.4)
Alpha2 Glob SerPl Elph-Mcnc: 0.8 g/dL (ref 0.4–1.0)
B-Globulin SerPl Elph-Mcnc: 0.9 g/dL (ref 0.7–1.3)
Gamma Glob SerPl Elph-Mcnc: 1.8 g/dL (ref 0.4–1.8)
Globulin, Total: 3.7 g/dL (ref 2.2–3.9)
IgA: 184 mg/dL (ref 61–437)
IgG (Immunoglobin G), Serum: 1696 mg/dL — ABNORMAL HIGH (ref 603–1613)
IgM (Immunoglobulin M), Srm: 338 mg/dL — ABNORMAL HIGH (ref 20–172)
M Protein SerPl Elph-Mcnc: 0.5 g/dL — ABNORMAL HIGH
Total Protein ELP: 7.1 g/dL (ref 6.0–8.5)

## 2019-08-29 LAB — KAPPA/LAMBDA LIGHT CHAINS
Kappa free light chain: 218.7 mg/L — ABNORMAL HIGH (ref 3.3–19.4)
Kappa, lambda light chain ratio: 3.12 — ABNORMAL HIGH (ref 0.26–1.65)
Lambda free light chains: 70.2 mg/L — ABNORMAL HIGH (ref 5.7–26.3)

## 2019-08-30 DIAGNOSIS — I739 Peripheral vascular disease, unspecified: Secondary | ICD-10-CM | POA: Diagnosis not present

## 2019-08-30 DIAGNOSIS — E1351 Other specified diabetes mellitus with diabetic peripheral angiopathy without gangrene: Secondary | ICD-10-CM | POA: Diagnosis not present

## 2019-08-30 DIAGNOSIS — M205X1 Other deformities of toe(s) (acquired), right foot: Secondary | ICD-10-CM | POA: Diagnosis not present

## 2019-08-30 DIAGNOSIS — L97519 Non-pressure chronic ulcer of other part of right foot with unspecified severity: Secondary | ICD-10-CM | POA: Diagnosis not present

## 2019-09-04 ENCOUNTER — Telehealth: Payer: Self-pay

## 2019-09-04 NOTE — Telephone Encounter (Signed)
Unable to leave vm.

## 2019-09-04 NOTE — Telephone Encounter (Signed)
-----   Message from Truitt Merle, MD sent at 09/04/2019 12:58 PM EST ----- Please let pt know his lab results. M-protein and light chain levels are stable, anemia slightly worse due to his CKD, no other concerns, thanks   Truitt Merle  09/04/2019

## 2019-09-06 ENCOUNTER — Telehealth: Payer: Self-pay

## 2019-09-06 NOTE — Telephone Encounter (Signed)
Spoke with patient regarding lab results, M-protein and light chains are stable, anemia is a little worse probably related to kidney disease (per Dr. Burr Medico), patient verbalized an understanding.

## 2019-09-07 ENCOUNTER — Inpatient Hospital Stay (HOSPITAL_COMMUNITY): Admission: RE | Admit: 2019-09-07 | Payer: Medicare Other | Source: Ambulatory Visit

## 2019-09-20 DIAGNOSIS — Z89512 Acquired absence of left leg below knee: Secondary | ICD-10-CM | POA: Diagnosis not present

## 2019-09-20 DIAGNOSIS — L97519 Non-pressure chronic ulcer of other part of right foot with unspecified severity: Secondary | ICD-10-CM | POA: Diagnosis not present

## 2019-09-20 DIAGNOSIS — E1351 Other specified diabetes mellitus with diabetic peripheral angiopathy without gangrene: Secondary | ICD-10-CM | POA: Diagnosis not present

## 2019-09-20 DIAGNOSIS — I739 Peripheral vascular disease, unspecified: Secondary | ICD-10-CM | POA: Diagnosis not present

## 2019-09-24 DIAGNOSIS — E1351 Other specified diabetes mellitus with diabetic peripheral angiopathy without gangrene: Secondary | ICD-10-CM | POA: Diagnosis not present

## 2019-09-24 DIAGNOSIS — Z89512 Acquired absence of left leg below knee: Secondary | ICD-10-CM | POA: Diagnosis not present

## 2019-09-24 DIAGNOSIS — L97519 Non-pressure chronic ulcer of other part of right foot with unspecified severity: Secondary | ICD-10-CM | POA: Diagnosis not present

## 2019-09-24 DIAGNOSIS — I739 Peripheral vascular disease, unspecified: Secondary | ICD-10-CM | POA: Diagnosis not present

## 2019-09-27 ENCOUNTER — Other Ambulatory Visit: Payer: Self-pay

## 2019-09-27 ENCOUNTER — Ambulatory Visit: Payer: Medicare Other | Attending: Internal Medicine | Admitting: Internal Medicine

## 2019-09-27 DIAGNOSIS — Z89512 Acquired absence of left leg below knee: Secondary | ICD-10-CM | POA: Diagnosis not present

## 2019-09-27 DIAGNOSIS — Z23 Encounter for immunization: Secondary | ICD-10-CM

## 2019-09-27 DIAGNOSIS — G546 Phantom limb syndrome with pain: Secondary | ICD-10-CM

## 2019-09-27 DIAGNOSIS — I5042 Chronic combined systolic (congestive) and diastolic (congestive) heart failure: Secondary | ICD-10-CM

## 2019-09-27 DIAGNOSIS — E1122 Type 2 diabetes mellitus with diabetic chronic kidney disease: Secondary | ICD-10-CM

## 2019-09-27 DIAGNOSIS — G40909 Epilepsy, unspecified, not intractable, without status epilepticus: Secondary | ICD-10-CM

## 2019-09-27 DIAGNOSIS — I132 Hypertensive heart and chronic kidney disease with heart failure and with stage 5 chronic kidney disease, or end stage renal disease: Secondary | ICD-10-CM

## 2019-09-27 DIAGNOSIS — N185 Chronic kidney disease, stage 5: Secondary | ICD-10-CM

## 2019-09-27 DIAGNOSIS — Z794 Long term (current) use of insulin: Secondary | ICD-10-CM

## 2019-09-27 DIAGNOSIS — I10 Essential (primary) hypertension: Secondary | ICD-10-CM

## 2019-09-27 MED ORDER — AMLODIPINE BESYLATE 10 MG PO TABS: 10.0000 mg | ORAL_TABLET | Freq: Every day | ORAL | 6 refills | Status: DC

## 2019-09-27 MED ORDER — GABAPENTIN 100 MG PO CAPS: 100.0000 mg | ORAL_CAPSULE | Freq: Every day | ORAL | 3 refills | Status: DC

## 2019-09-27 MED ORDER — CARVEDILOL 12.5 MG PO TABS: ORAL_TABLET | ORAL | 6 refills | Status: DC

## 2019-09-27 MED ORDER — HYDRALAZINE HCL 25 MG PO TABS: 25.0000 mg | ORAL_TABLET | Freq: Two times a day (BID) | ORAL | 3 refills | Status: DC

## 2019-09-27 NOTE — Progress Notes (Signed)
Virtual Visit via Telephone Note Due to current restrictions/limitations of in-office visits due to the COVID-19 pandemic, this scheduled clinical appointment was converted to a telehealth visit  I connected with Mark [name] on 09/27/19 at 9:44 a.m by telephone and verified that I am speaking with the correct person using two identifiers. I am in my office.  The patient is at home.  Only the patient and myself participated in this encounter.  I discussed the limitations, risks, security and privacy concerns of performing an evaluation and management service by telephone and the availability of in person appointments. I also discussed with the patient that there may be a patient responsible charge related to this service. The patient expressed understanding and agreed to proceed.   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 Burr Medico, LT BKA, MGUS.  Last seen 05/2019.  Purpose of today's visit is chronic disease management.  CKD 5: referred to nephrology for f/u on last visit.  Pt states he was not called.  I looked at the referral message in the system.  They said pt is already establish and just needs to call for appt.  Still makes good urine.  Endorse some swelling in RT ankle  DM:  Has not seen eye specialist yet either.  Does not want to go out until he gets COVID-19 vaccine.  Tried to get the vaccine twice unsuccessfully.  -had A1C of 5.1 done earlier this mth by nurse sent out by his insurance -takes Lantus insulin now only when he needs to which is about 2 x a wk. BS this a.m was 128.  Range usually 120-130. No recent lows Doing well with eating habits  HTN/CHF Currently taking: see medication list Med Adherence: [x]  Yes -out of Hydralazine for a while due to lack of funds.  [facility] $40 so they will not send it  out.  He requests prescription sent to local pharmacy Medication side effects: []  Yes    [x]  No Adherence with salt restriction: [x]  Yes    []  No Home Monitoring?: [x]  Yes    []  No Monitoring Frequency: once a day Home BP results range: 166/72 SOB? []  Yes    [x]  No Chest Pain?: []  Yes    [x]  No Leg swelling?: [x]  Yes    []  No Headaches?: []  Yes    [x]  No Dizziness? []  Yes    [x]  No Comments:  No PND, sleeps on 2 pillows  LT BKA:  Phantom pain intermittently in stump.  No ulcers.  No falls. Wears prosthesis in the house  SZ;  No seizures since last visit.  Out of Vimpat but has already placed a call to his neurologist.  Current Outpatient Medications on File Prior to Visit  Medication Sig Dispense Refill  . acetaminophen (TYLENOL) 325 MG tablet Take 2 tablets (650 mg total) by mouth every 6 (six) hours as needed for mild pain (or Fever >/= 101).    Marland Kitchen amLODipine (NORVASC) 10 MG tablet TAKE 1 TABLET BY MOUTH  DAILY 90 tablet 0  . aspirin EC 81 MG tablet Take 1 tablet (81 mg total) by mouth daily. 30 tablet 5  . atorvastatin (LIPITOR) 20 MG tablet TAKE 1 TABLET BY MOUTH  DAILY 90 tablet 0  . bisacodyl (DULCOLAX) 5 MG EC tablet Take 5 mg by  mouth daily as needed for moderate constipation.    . Blood Glucose Monitoring Suppl (ONETOUCH VERIO) w/Device KIT Use as directed to test blood sugar three times daily. 1 kit 0  . carvedilol (COREG) 12.5 MG tablet TAKE 1.5 TABLET BY MOUTH 2 TIMES DAILY WITH A MEAL. 90 tablet 6  . ferrous gluconate (FERGON) 324 MG tablet Take 1 tablet (324 mg total) by mouth 2 (two) times daily with a meal. 60 tablet 5  . furosemide (LASIX) 20 MG tablet Take 1 tablet (20 mg total) by mouth daily. 30 tablet 0  . glucose blood (ONETOUCH VERIO) test strip Use as directed to test blood sugar three times daily. 100 each 12  . hydrALAZINE (APRESOLINE) 25 MG tablet Take 1 tablet (25 mg total) by mouth 2 (two) times daily. 180 tablet 3  . Insulin Glargine (LANTUS SOLOSTAR) 100  UNIT/ML Solostar Pen Inject 10 Units into the skin at bedtime. 15 mL 0  . Insulin Pen Needle (TRUEPLUS PEN NEEDLES) 32G X 4 MM MISC Use as directed to administer lantus daily 100 each 1  . Insulin Syringe-Needle U-100 (INSULIN SYRINGE .5CC/30GX5/16") 30G X 5/16" 0.5 ML MISC Check blood sugar TID & QHS 100 each 2  . lacosamide (VIMPAT) 200 MG TABS tablet Take 1 tablet (200 mg total) by mouth 2 (two) times daily. 180 tablet 0  . Multiple Vitamin (MULTIVITAMIN WITH MINERALS) TABS tablet Take 1 tablet by mouth daily.    [name] Delica Lancets 78G MISC Use as directed to test blood sugar three times daily. 100 each 12  . senna-docusate (SENOKOT-S) 8.6-50 MG tablet Take 1 tablet by mouth 2 (two) times daily.    . sodium bicarbonate 650 MG tablet Take 1 tablet (650 mg total) by mouth 3 (three) times daily. 90 tablet 0   No current facility-administered medications on file prior to visit.      Observations/Objective: Results for orders placed or performed in visit on 08/28/19  CMP ([place] only)  Result Value Ref Range   Sodium 146 (H) 135 - 145 mmol/L   Potassium 5.0 3.5 - 5.1 mmol/L   Chloride 121 (H) 98 - 111 mmol/L   CO2 14 (L) 22 - 32 mmol/L   Glucose, Bld 127 (H) 70 - 99 mg/dL   BUN 66 (H) 8 - 23 mg/dL   Creatinine 5.24 (HH) 0.61 - 1.24 mg/dL   Calcium 8.7 (L) 8.9 - 10.3 mg/dL   Total Protein 7.9 6.5 - 8.1 g/dL   Albumin 3.6 3.5 - 5.0 g/dL   AST 13 (L) 15 - 41 U/L   ALT 11 0 - 44 U/L   Alkaline Phosphatase 95 38 - 126 U/L   Total Bilirubin <0.2 (L) 0.3 - 1.2 mg/dL   GFR, Est Non Af Am 10 (L) >60 mL/min   GFR, Est AFR Am 12 (L) >60 mL/min   Anion gap 11 5 - 15  CBC with Differential ([facility] Only)  Result Value Ref Range   WBC Count 7.1 4.0 - 10.5 K/uL   RBC 3.00 (L) 4.22 - 5.81 MIL/uL   Hemoglobin 9.0 (L) 13.0 - 17.0 g/dL   HCT 29.6 (L) 39.0 - 52.0 %   MCV 98.7 80.0 - 100.0 fL   MCH 30.0 26.0 - 34.0 pg   MCHC 30.4 30.0 - 36.0 g/dL   RDW 14.9 11.5 - 15.5 %    Platelet Count 289 150 - 400 K/uL   nRBC 0.0 0.0 - 0.2 %  Neutrophils Relative % 48 %   Neutro Abs 3.4 1.7 - 7.7 K/uL   Lymphocytes Relative 37 %   Lymphs Abs 2.7 0.7 - 4.0 K/uL   Monocytes Relative 8 %   Monocytes Absolute 0.6 0.1 - 1.0 K/uL   Eosinophils Relative 6 %   Eosinophils Absolute 0.5 0.0 - 0.5 K/uL   Basophils Relative 1 %   Basophils Absolute 0.1 0.0 - 0.1 K/uL   Immature Granulocytes 0 %   Abs Immature Granulocytes 0.02 0.00 - 0.07 K/uL  Kappa/lambda light chains  Result Value Ref Range   Kappa free light chain 218.7 (H) 3.3 - 19.4 mg/L   Lamda free light chains 70.2 (H) 5.7 - 26.3 mg/L   Kappa, lamda light chain ratio 3.12 (H) 0.26 - 1.65  Multiple Myeloma Panel (SPEP&IFE w/QIG)  Result Value Ref Range   IgG (Immunoglobin G), Serum 1,696 (H) 603 - 1,613 mg/dL   IgA 184 61 - 437 mg/dL   IgM (Immunoglobulin M), Srm 338 (H) 20 - 172 mg/dL   Total Protein ELP 7.1 6.0 - 8.5 g/dL   Albumin SerPl Elph-Mcnc 3.4 2.9 - 4.4 g/dL   Alpha 1 0.2 0.0 - 0.4 g/dL   Alpha2 Glob SerPl Elph-Mcnc 0.8 0.4 - 1.0 g/dL   B-Globulin SerPl Elph-Mcnc 0.9 0.7 - 1.3 g/dL   Gamma Glob SerPl Elph-Mcnc 1.8 0.4 - 1.8 g/dL   M Protein SerPl Elph-Mcnc 0.5 (H) Not Observed g/dL   Globulin, Total 3.7 2.2 - 3.9 g/dL   Albumin/Glob SerPl 1.0 0.7 - 1.7   IFE 1 Comment (A)    Please Note Comment      Assessment and Plan: 1. 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  He is not having to use Lantus on a daily basis which is fine as long as his blood sugars remain good.  Encouraged him to continue healthy eating habits. -Given phone number to call and schedule an appointment with [facility].  2. Essential hypertension Not at goal.  Out of hydralazine.  He requested that hydralazine, carvedilol and amlodipine prescriptions be sent to a local pharmacy instead of [facility] - hydrALAZINE (APRESOLINE) 25 MG tablet; Take 1 tablet (25 mg total) by mouth 2 (two) times daily.  Dispense: 180 tablet; Refill: 3 - carvedilol (COREG) 12.5 MG tablet; TAKE 1.5 TABLET BY MOUTH 2 TIMES DAILY WITH A MEAL.  Dispense: 90 tablet; Refill: 6 - amLODipine (NORVASC) 10 MG tablet; Take 1 tablet (10 mg total) by mouth daily.  Dispense: 30 tablet; Refill: 6  3. Chronic combined systolic and diastolic congestive heart failure ([place]) Compensated by history.  Continue current medications including carvedilol and furosemide - carvedilol (COREG) 12.5 MG tablet; TAKE 1.5 TABLET BY MOUTH 2 TIMES DAILY WITH A MEAL.  Dispense: 90 tablet; Refill: 6  4. Left below-knee amputee [facility]) Wears prosthesis.  Denies any falls  5. Seizure disorder ([place]) Followed by neurology.  On Vimpat  6. High priority for COVID-19 virus vaccination I will try to find the information of the site for him to sign up on the wait list at [facility]  Encouraged him to continue practicing social distancing, good handwashing and wearing a mask when in public  7. Phantom limb pain ([place]) He is agreeable to trying a low-dose of gabapentin - gabapentin (NEURONTIN) 100 MG capsule; Take 1 capsule (100 mg total) by mouth at bedtime.  Dispense: 30 capsule; Refill: 3  Follow Up Instructions: 3 mths   I discussed the assessment and treatment plan with the patient. The patient was provided an opportunity to ask questions and all were answered. The patient agreed with the plan and demonstrated an understanding of the instructions.   The patient was advised to call back or seek an in-person evaluation if the symptoms worsen or if the condition fails to improve as anticipated.  I provided 20 minutes of non-face-to-face time during this encounter.   Karle Plumber, MD

## 2019-09-27 NOTE — Progress Notes (Signed)
Patient has been called and DOB has been verified. Patient has been screened and transferred to PCP to start phone visit.     

## 2019-10-04 DIAGNOSIS — M205X1 Other deformities of toe(s) (acquired), right foot: Secondary | ICD-10-CM | POA: Diagnosis not present

## 2019-10-04 DIAGNOSIS — I739 Peripheral vascular disease, unspecified: Secondary | ICD-10-CM | POA: Diagnosis not present

## 2019-10-04 DIAGNOSIS — Z89512 Acquired absence of left leg below knee: Secondary | ICD-10-CM | POA: Diagnosis not present

## 2019-10-04 DIAGNOSIS — I70211 Atherosclerosis of native arteries of extremities with intermittent claudication, right leg: Secondary | ICD-10-CM | POA: Diagnosis not present

## 2019-10-04 DIAGNOSIS — L97519 Non-pressure chronic ulcer of other part of right foot with unspecified severity: Secondary | ICD-10-CM | POA: Diagnosis not present

## 2019-10-05 ENCOUNTER — Encounter (HOSPITAL_COMMUNITY): Payer: Medicare Other

## 2019-10-15 DIAGNOSIS — I739 Peripheral vascular disease, unspecified: Secondary | ICD-10-CM | POA: Diagnosis not present

## 2019-10-15 DIAGNOSIS — I70223 Atherosclerosis of native arteries of extremities with rest pain, bilateral legs: Secondary | ICD-10-CM | POA: Diagnosis not present

## 2019-10-20 DIAGNOSIS — L97519 Non-pressure chronic ulcer of other part of right foot with unspecified severity: Secondary | ICD-10-CM | POA: Diagnosis not present

## 2019-10-20 DIAGNOSIS — I70235 Atherosclerosis of native arteries of right leg with ulceration of other part of foot: Secondary | ICD-10-CM | POA: Diagnosis not present

## 2019-10-21 DIAGNOSIS — I70211 Atherosclerosis of native arteries of extremities with intermittent claudication, right leg: Secondary | ICD-10-CM | POA: Diagnosis not present

## 2019-10-25 ENCOUNTER — Other Ambulatory Visit: Payer: Self-pay | Admitting: Neurology

## 2019-10-25 DIAGNOSIS — E1351 Other specified diabetes mellitus with diabetic peripheral angiopathy without gangrene: Secondary | ICD-10-CM | POA: Diagnosis not present

## 2019-10-25 DIAGNOSIS — I739 Peripheral vascular disease, unspecified: Secondary | ICD-10-CM | POA: Diagnosis not present

## 2019-10-25 DIAGNOSIS — L97519 Non-pressure chronic ulcer of other part of right foot with unspecified severity: Secondary | ICD-10-CM | POA: Diagnosis not present

## 2019-10-25 DIAGNOSIS — Z89512 Acquired absence of left leg below knee: Secondary | ICD-10-CM | POA: Diagnosis not present

## 2019-10-25 NOTE — Telephone Encounter (Signed)
I called the patient.  She is been off Vimpat for at least 6 months.  Apparently he went to see his PCP, was encouraged to get back on the Vimpat.  He has been on Vimpat since at least 2017, tells me since has been taking it he has been having hallucinations, now that he is off the medicine he no longer has these.  He is coming to see me on Monday, we can discuss further evaluation, and medication at that time.  I will not refill the Vimpat since he had side effect.

## 2019-10-25 NOTE — Telephone Encounter (Signed)
1) Medication(s) Requested (by name): lacosamide (VIMPAT) 200 MG TABS tablet   2) Pharmacy of Choice: [facility] on [address]

## 2019-10-25 NOTE — Telephone Encounter (Signed)
Noted  

## 2019-10-25 NOTE — Telephone Encounter (Signed)
[place] Database Verified LR: 01-29-2019 Qty: 180 Pending appointment: 10-29-2019 Judson Roch)

## 2019-10-29 ENCOUNTER — Ambulatory Visit (INDEPENDENT_AMBULATORY_CARE_PROVIDER_SITE_OTHER): Payer: Medicare Other | Admitting: Neurology

## 2019-10-29 ENCOUNTER — Telehealth: Payer: Self-pay | Admitting: Neurology

## 2019-10-29 ENCOUNTER — Other Ambulatory Visit: Payer: Self-pay

## 2019-10-29 ENCOUNTER — Encounter: Payer: Self-pay | Admitting: Neurology

## 2019-10-29 VITALS — BP 153/76 | HR 71 | Temp 97.7°F | Ht 74.0 in | Wt 256.2 lb

## 2019-10-29 DIAGNOSIS — G40909 Epilepsy, unspecified, not intractable, without status epilepticus: Secondary | ICD-10-CM | POA: Diagnosis not present

## 2019-10-29 NOTE — Telephone Encounter (Signed)
This patient did not show for his appointment today. Please try to reschedule him, he requested refill of Vimpat last week, after being off it for several months due to reported side effect. My understanding is he had a single seizure event previously, we may repeat EEG and consider keeping him off seizure medications possibly.

## 2019-10-29 NOTE — Patient Instructions (Signed)
It was good to see you today! Let's get EEG and MRI of the brain to determine if you can stay off seizure medications For now stay off Vimpat See you in 6 months
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
## 2019-10-29 NOTE — Telephone Encounter (Signed)
[employer] medicare/medicaid order sent to [facility]. No auth they will reach out to the patient to schedule.  °

## 2019-10-29 NOTE — Progress Notes (Signed)
I have read the note, and I agree with the clinical assessment and plan.  Saul K Kameko Hukill   

## 2019-11-01 DIAGNOSIS — N179 Acute kidney failure, unspecified: Secondary | ICD-10-CM | POA: Diagnosis not present

## 2019-11-01 DIAGNOSIS — E1122 Type 2 diabetes mellitus with diabetic chronic kidney disease: Secondary | ICD-10-CM | POA: Diagnosis not present

## 2019-11-01 DIAGNOSIS — N185 Chronic kidney disease, stage 5: Secondary | ICD-10-CM | POA: Diagnosis not present

## 2019-11-01 DIAGNOSIS — D631 Anemia in chronic kidney disease: Secondary | ICD-10-CM | POA: Diagnosis not present

## 2019-11-01 DIAGNOSIS — I120 Hypertensive chronic kidney disease with stage 5 chronic kidney disease or end stage renal disease: Secondary | ICD-10-CM | POA: Diagnosis not present

## 2019-11-01 DIAGNOSIS — N2581 Secondary hyperparathyroidism of renal origin: Secondary | ICD-10-CM | POA: Diagnosis not present

## 2019-11-01 DIAGNOSIS — N189 Chronic kidney disease, unspecified: Secondary | ICD-10-CM | POA: Diagnosis not present

## 2019-11-07 DIAGNOSIS — N185 Chronic kidney disease, stage 5: Secondary | ICD-10-CM | POA: Diagnosis not present

## 2019-11-12 ENCOUNTER — Ambulatory Visit (INDEPENDENT_AMBULATORY_CARE_PROVIDER_SITE_OTHER): Payer: Medicare Other | Admitting: Neurology

## 2019-11-12 ENCOUNTER — Other Ambulatory Visit: Payer: Self-pay

## 2019-11-12 ENCOUNTER — Telehealth: Payer: Self-pay | Admitting: Neurology

## 2019-11-12 DIAGNOSIS — G40909 Epilepsy, unspecified, not intractable, without status epilepticus: Secondary | ICD-10-CM

## 2019-11-12 NOTE — Telephone Encounter (Signed)
I called the patient.  EEG study was normal.

## 2019-11-12 NOTE — Procedures (Signed)
    History:  Mark [name] is a 69 year old gentleman with a history of a single seizure event in 2017 in the setting of hypoglycemia and cocaine use.  The patient stopped his seizure medication about a year ago, he wishes to remain off the drug.  He is being reevaluated for the seizures.  This is a routine EEG.  No skull defects are noted.  Medications include amlodipine, aspirin, Lipitor, Dulcolax, Coreg, iron supplementation, Lasix, gabapentin, hydralazine, insulin, multivitamins, and Tylenol.  EEG classification: Normal awake and drowsy  Description of the recording: The background rhythms of this recording consists of a fairly well modulated medium amplitude alpha rhythm of 9 Hz that is reactive to eye opening and closure. As the record progresses, the patient appears to remain in the waking state throughout the recording. Photic stimulation was performed, resulting in a bilateral and symmetric photic driving response. Hyperventilation was also performed, resulting in a minimal buildup of the background rhythm activities without significant slowing seen. Toward the end of the recording, the patient enters the drowsy state with slight symmetric slowing seen. The patient never enters stage II sleep. At no time during the recording does there appear to be evidence of spike or spike wave discharges or evidence of focal slowing. EKG monitor shows no evidence of cardiac rhythm abnormalities with a heart rate of 60.  Impression: This is a normal EEG recording in the waking and drowsy state. No evidence of ictal or interictal discharges are seen.

## 2019-11-13 ENCOUNTER — Ambulatory Visit (HOSPITAL_COMMUNITY)
Admission: RE | Admit: 2019-11-13 | Discharge: 2019-11-13 | Disposition: A | Discharge status: Home / Self Care | Payer: Medicare Other | Source: Ambulatory Visit | Attending: Nephrology | Admitting: Nephrology

## 2019-11-13 DIAGNOSIS — N184 Chronic kidney disease, stage 4 (severe): Secondary | ICD-10-CM | POA: Insufficient documentation

## 2019-11-13 DIAGNOSIS — D631 Anemia in chronic kidney disease: Secondary | ICD-10-CM | POA: Insufficient documentation

## 2019-11-13 LAB — COMPREHENSIVE METABOLIC PANEL
ALT: 11 U/L (ref 0–44)
AST: 9 U/L — ABNORMAL LOW (ref 15–41)
Albumin: 3.3 g/dL — ABNORMAL LOW (ref 3.5–5.0)
Alkaline Phosphatase: 75 U/L (ref 38–126)
Anion gap: 11 (ref 5–15)
BUN: 61 mg/dL — ABNORMAL HIGH (ref 8–23)
CO2: 19 mmol/L — ABNORMAL LOW (ref 22–32)
Calcium: 8.9 mg/dL (ref 8.9–10.3)
Chloride: 113 mmol/L — ABNORMAL HIGH (ref 98–111)
Creatinine, Ser: 5.63 mg/dL — ABNORMAL HIGH (ref 0.61–1.24)
GFR calc Af Amer: 11 mL/min — ABNORMAL LOW (ref >60)
GFR calc non Af Amer: 10 mL/min — ABNORMAL LOW (ref >60)
Glucose, Bld: 163 mg/dL — ABNORMAL HIGH (ref 70–99)
Potassium: 4.5 mmol/L (ref 3.5–5.1)
Sodium: 143 mmol/L (ref 135–145)
Total Bilirubin: 0.5 mg/dL (ref 0.3–1.2)
Total Protein: 7.4 g/dL (ref 6.5–8.1)

## 2019-11-13 LAB — IRON AND TIBC
Iron: 39 ug/dL — ABNORMAL LOW (ref 45–182)
Saturation Ratios: 20 % (ref 17.9–39.5)
TIBC: 197 ug/dL — ABNORMAL LOW (ref 250–450)
UIBC: 158 ug/dL

## 2019-11-13 LAB — FERRITIN: Ferritin: 261 ng/mL (ref 24–336)

## 2019-11-13 LAB — PHOSPHORUS: Phosphorus: 3.7 mg/dL (ref 2.5–4.6)

## 2019-11-13 MED ORDER — EPOETIN ALFA-EPBX 10000 UNIT/ML IJ SOLN
INTRAMUSCULAR | Status: AC
  Filled 2019-11-13: qty 1

## 2019-11-13 MED ORDER — EPOETIN ALFA-EPBX 10000 UNIT/ML IJ SOLN
10000.0000 [IU] | INTRAMUSCULAR | Status: DC
  Administered 2019-11-13: 10000 [IU] via SUBCUTANEOUS

## 2019-11-13 NOTE — Telephone Encounter (Signed)
Noted, EEG was normal, looks like scheduled for MRI of the brain. Will update patient with MRI results once available.

## 2019-11-13 NOTE — Telephone Encounter (Signed)
I called pt and relayed that EEG normal, he is scheduled 3-27--21 at 1500 for MRI.  He verbalized understanding.

## 2019-11-14 LAB — POCT HEMOGLOBIN-HEMACUE: Hemoglobin: 8.7 g/dL — ABNORMAL LOW (ref 13.0–17.0)

## 2019-11-15 DIAGNOSIS — I739 Peripheral vascular disease, unspecified: Secondary | ICD-10-CM | POA: Diagnosis not present

## 2019-11-15 DIAGNOSIS — E1351 Other specified diabetes mellitus with diabetic peripheral angiopathy without gangrene: Secondary | ICD-10-CM | POA: Diagnosis not present

## 2019-11-15 DIAGNOSIS — L97519 Non-pressure chronic ulcer of other part of right foot with unspecified severity: Secondary | ICD-10-CM | POA: Diagnosis not present

## 2019-11-15 DIAGNOSIS — Z89512 Acquired absence of left leg below knee: Secondary | ICD-10-CM | POA: Diagnosis not present

## 2019-11-22 DIAGNOSIS — L97519 Non-pressure chronic ulcer of other part of right foot with unspecified severity: Secondary | ICD-10-CM | POA: Diagnosis not present

## 2019-11-22 DIAGNOSIS — E1351 Other specified diabetes mellitus with diabetic peripheral angiopathy without gangrene: Secondary | ICD-10-CM | POA: Diagnosis not present

## 2019-11-22 DIAGNOSIS — Z89512 Acquired absence of left leg below knee: Secondary | ICD-10-CM | POA: Diagnosis not present

## 2019-11-22 DIAGNOSIS — M205X1 Other deformities of toe(s) (acquired), right foot: Secondary | ICD-10-CM | POA: Diagnosis not present

## 2019-11-23 DIAGNOSIS — Z89512 Acquired absence of left leg below knee: Secondary | ICD-10-CM | POA: Diagnosis not present

## 2019-11-23 DIAGNOSIS — L97519 Non-pressure chronic ulcer of other part of right foot with unspecified severity: Secondary | ICD-10-CM | POA: Diagnosis not present

## 2019-11-23 DIAGNOSIS — I70235 Atherosclerosis of native arteries of right leg with ulceration of other part of foot: Secondary | ICD-10-CM | POA: Diagnosis not present

## 2019-11-23 DIAGNOSIS — Z89421 Acquired absence of other right toe(s): Secondary | ICD-10-CM | POA: Diagnosis not present

## 2019-11-24 ENCOUNTER — Other Ambulatory Visit: Payer: Self-pay

## 2019-11-24 ENCOUNTER — Ambulatory Visit
Admission: RE | Admit: 2019-11-24 | Discharge: 2019-11-24 | Disposition: A | Discharge status: Home / Self Care | Payer: Medicare Other | Source: Ambulatory Visit | Attending: Neurology | Admitting: Neurology

## 2019-11-24 DIAGNOSIS — G40909 Epilepsy, unspecified, not intractable, without status epilepticus: Secondary | ICD-10-CM

## 2019-11-26 ENCOUNTER — Telehealth: Payer: Self-pay | Admitting: Neurology

## 2019-11-26 NOTE — Telephone Encounter (Signed)
Please call patient.  MRI of the brain did not show acute findings, showed mild generalized cortical atrophy, in regards to seizures, some abnormality was seen In the left temporal area, which could possibly lead to seizures.  However, he has only had one single seizure event in the setting of cocaine and hypoglycemia in 2017. Did discuss with Dr. Jannifer Franklin, he may remain off seizure medication for the time being, he should let us know if any events reoccur. He has been off Vimpat for nearly a year with no events. EEG was normal.   IMPRESSION: This MRI of the brain without contrast shows the following: 1.   There were no acute findings. 2.   Mild generalized cortical atrophy.  The temporal horn of the left lateral ventricle is larger than the right.  Adjacent hippocampus and temporal lobe abnormal signal.  No definite change compared to the 2017 MRI. 3.   Scattered T2/FLAIR hyperintense foci consistent with moderate for age chronic microvascular ischemic changes, progressed compared to the 2017 MRI. 4.   Ethmoid and right frontal chronic sinusitis.

## 2019-11-26 NOTE — Telephone Encounter (Signed)
LMVM for pt to return call for MRI results.  

## 2019-11-27 NOTE — Telephone Encounter (Signed)
LMVM for pt to return call.   

## 2019-11-28 ENCOUNTER — Encounter: Payer: Self-pay | Admitting: *Deleted

## 2019-11-28 NOTE — Telephone Encounter (Signed)
I sent a result letter to pts address since have not been able to reach him.

## 2019-12-03 DIAGNOSIS — I7091 Generalized atherosclerosis: Secondary | ICD-10-CM | POA: Diagnosis not present

## 2019-12-03 DIAGNOSIS — I70201 Unspecified atherosclerosis of native arteries of extremities, right leg: Secondary | ICD-10-CM | POA: Diagnosis not present

## 2019-12-03 DIAGNOSIS — I70235 Atherosclerosis of native arteries of right leg with ulceration of other part of foot: Secondary | ICD-10-CM | POA: Diagnosis not present

## 2019-12-03 DIAGNOSIS — L97519 Non-pressure chronic ulcer of other part of right foot with unspecified severity: Secondary | ICD-10-CM | POA: Diagnosis not present

## 2019-12-03 DIAGNOSIS — E1151 Type 2 diabetes mellitus with diabetic peripheral angiopathy without gangrene: Secondary | ICD-10-CM | POA: Diagnosis not present

## 2019-12-04 ENCOUNTER — Telehealth (HOSPITAL_COMMUNITY): Payer: Self-pay

## 2019-12-04 ENCOUNTER — Other Ambulatory Visit: Payer: Self-pay | Admitting: *Deleted

## 2019-12-04 DIAGNOSIS — N185 Chronic kidney disease, stage 5: Secondary | ICD-10-CM

## 2019-12-04 NOTE — Telephone Encounter (Signed)

## 2019-12-05 ENCOUNTER — Ambulatory Visit (INDEPENDENT_AMBULATORY_CARE_PROVIDER_SITE_OTHER): Payer: Medicare Other | Admitting: Vascular Surgery

## 2019-12-05 ENCOUNTER — Other Ambulatory Visit: Payer: Self-pay

## 2019-12-05 ENCOUNTER — Ambulatory Visit (HOSPITAL_COMMUNITY)
Admission: RE | Admit: 2019-12-05 | Discharge: 2019-12-05 | Disposition: A | Discharge status: Home / Self Care | Payer: Medicare Other | Source: Ambulatory Visit | Attending: Surgery | Admitting: Surgery

## 2019-12-05 ENCOUNTER — Ambulatory Visit (INDEPENDENT_AMBULATORY_CARE_PROVIDER_SITE_OTHER)
Admission: RE | Admit: 2019-12-05 | Discharge: 2019-12-05 | Disposition: A | Discharge status: Home / Self Care | Payer: Medicare Other | Source: Ambulatory Visit | Attending: Surgery | Admitting: Surgery

## 2019-12-05 ENCOUNTER — Encounter: Payer: Self-pay | Admitting: Vascular Surgery

## 2019-12-05 VITALS — BP 143/68 | HR 75 | Temp 97.9°F | Resp 20 | Ht 74.0 in | Wt 255.0 lb

## 2019-12-05 DIAGNOSIS — N185 Chronic kidney disease, stage 5: Secondary | ICD-10-CM

## 2019-12-05 NOTE — Progress Notes (Signed)
REASON FOR CONSULT:    To evaluate for hemodialysis access.  The consult is requested by Juanell Fairly, NP  ASSESSMENT & PLAN:   STAGE V CHRONIC KIDNEY DISEASE: This patient has small veins.  It appears that his only option for a fistula would likely be a basilic vein transposition.  I explained that if the vein appears reasonable in size we would likely do this in 2 stages.  If the vein is not adequate then we would place an AV graft. I have explained the indications for placement of an AV fistula or AV graft. I've explained that if at all possible we will place an AV fistula.  I have reviewed the risks of placement of an AV fistula including but not limited to: failure of the fistula to mature, need for subsequent interventions, and thrombosis. In addition I have reviewed the potential complications of placement of an AV graft. These risks include, but are not limited to, graft thrombosis, graft infection, wound healing problems, bleeding, arm swelling, and steal syndrome. All the patient's questions were answered and they are agreeable to proceed with surgery.  His surgery is scheduled for 12/11/2019.  All of his questions were answered and he is agreeable to proceed.  Deitra Mayo, MD Office: [phone]   HPI:   Mark [name] is a pleasant 69 y.o. male, who was referred for evaluation of hemodialysis for hemodialysis access.  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  In addition he has hypertension and chronic diastolic congestive heart failure.  He has had some poor appetite but no other uremic symptoms.  Specifically, he denies nausea, vomiting, or palpitations.  He has had some fatigue.  He is right-handed.  He does not have a pacemaker.  He is not on blood thinners.  He has had a previous left below the knee amputation and is ambulatory with his prosthesis.  He cuts his own grass.  Past Medical History:  Diagnosis Date  . Acid indigestion   .  Acute encephalopathy 01/01/2016  . Acute renal failure superimposed on stage 3 chronic kidney disease ([place]) 04/16/2015  . Anemia 10/01/2013  . CHF (congestive heart failure) ([place])   . Chronic kidney disease   . CKD (chronic kidney disease) stage 3, GFR 30-59 ml/min 08/18/2015  . Diabetes mellitus, type 2 ([place]) 04/16/2015  . Diverticulitis   . DM (diabetes mellitus), type 2 with peripheral vascular complications ([place])   . Elevated troponin 10/16/2014  . Essential hypertension 04/08/2014  . History of Clostridium difficile colitis 01/01/2016  . Hypertension    no pcp  . Hypothermia 01/01/2016  . Malnutrition of moderate degree ([place]) 04/17/2015  . Onychomycosis of toenail 04/30/2015  . Phantom limb pain ([place]) 12/12/2013  . Positive for microalbuminuria 08/18/2015  . S/P BKA (below knee amputation) ([place]) 11/21/2013   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    . Seizures ([place])   . Spleen absent   . Substance abuse ([place]) 04/02/2016   Cocaine  . Wound infection 01/02/2016    Family History  Problem Relation Age of Onset  . Diabetes Mother     SOCIAL HISTORY: Social History   Socioeconomic History  . Marital status: Married    Spouse name: Not on file  . Number of children: 1  . Years of education: Some college  . Highest education level: Not on file  Occupational History  . Occupation: Mows grass  Tobacco Use  . Smoking status: Former Smoker  Packs/day: 1.00    Years: 10.00    Pack years: 10.00    Quit date: 08/03/2013    Years since quitting: 6.3  . Smokeless tobacco: Never Used  Substance and Sexual Activity  . Alcohol use: No  . Drug use: No    Types: Cocaine    Comment: 01-01-16   . Sexual activity: Not on file  Other Topics Concern  . Not on file  Social History Narrative   Lives with his wife, [name]   Admitted to [facility] 01/08/16   Full Code   Right-handed   Caffeine: none currently   Social Determinants of Health   Financial Resource Strain:   . Difficulty of Paying Living  Expenses:   Food Insecurity:   . Worried About Charity fundraiser in the Last Year:   . Arboriculturist in the Last Year:   Transportation Needs:   . Film/video editor (Medical):   Marland Kitchen Lack of Transportation (Non-Medical):   Physical Activity:   . Days of Exercise per Week:   . Minutes of Exercise per Session:   Stress:   . Feeling of Stress :   Social Connections:   . Frequency of Communication with Friends and Family:   . Frequency of Social Gatherings with Friends and Family:   . Attends Religious Services:   . Active Member of Clubs or Organizations:   . Attends Archivist Meetings:   Marland Kitchen Marital Status:   Intimate Partner Violence:   . Fear of Current or Ex-Partner:   . Emotionally Abused:   Marland Kitchen Physically Abused:   . Sexually Abused:     No Known Allergies  Current Outpatient Medications  Medication Sig Dispense Refill  . amLODipine (NORVASC) 10 MG tablet Take 1 tablet (10 mg total) by mouth daily. 30 tablet 6  . aspirin EC 81 MG tablet Take 1 tablet (81 mg total) by mouth daily. 30 tablet 5  . atorvastatin (LIPITOR) 20 MG tablet TAKE 1 TABLET BY MOUTH  DAILY 90 tablet 0  . bisacodyl (DULCOLAX) 5 MG EC tablet Take 5 mg by mouth daily as needed for moderate constipation.    . Blood Glucose Monitoring Suppl (ONETOUCH VERIO) w/Device KIT Use as directed to test blood sugar three times daily. 1 kit 0  . carvedilol (COREG) 12.5 MG tablet TAKE 1.5 TABLET BY MOUTH 2 TIMES DAILY WITH A MEAL. 90 tablet 6  . ferrous gluconate (FERGON) 324 MG tablet Take 1 tablet (324 mg total) by mouth 2 (two) times daily with a meal. 60 tablet 5  . furosemide (LASIX) 20 MG tablet Take 1 tablet (20 mg total) by mouth daily. 30 tablet 0  . gabapentin (NEURONTIN) 100 MG capsule Take 1 capsule (100 mg total) by mouth at bedtime. 30 capsule 3  . glucose blood (ONETOUCH VERIO) test strip Use as directed to test blood sugar three times daily. 100 each 12  . hydrALAZINE (APRESOLINE) 25 MG  tablet Take 1 tablet (25 mg total) by mouth 2 (two) times daily. 180 tablet 3  . Insulin Glargine (LANTUS SOLOSTAR) 100 UNIT/ML Solostar Pen Inject 10 Units into the skin at bedtime. 15 mL 0  . Insulin Pen Needle (TRUEPLUS PEN NEEDLES) 32G X 4 MM MISC Use as directed to administer lantus daily 100 each 1  . Insulin Syringe-Needle U-100 (INSULIN SYRINGE .5CC/30GX5/16") 30G X 5/16" 0.5 ML MISC Check blood sugar TID & QHS 100 each 2  . Multiple Vitamin (MULTIVITAMIN WITH MINERALS) TABS  tablet Take 1 tablet by mouth daily.    [name] Delica Lancets 94H MISC Use as directed to test blood sugar three times daily. 100 each 12  . senna-docusate (SENOKOT-S) 8.6-50 MG tablet Take 1 tablet by mouth 2 (two) times daily.     No current facility-administered medications for this visit.    REVIEW OF SYSTEMS:  [X]  denotes positive finding, [ ]  denotes negative finding Cardiac  Comments:  Chest pain or chest pressure:    Shortness of breath upon exertion: x   Short of breath when lying flat:    Irregular heart rhythm:        Vascular    Pain in calf, thigh, or hip brought on by ambulation:    Pain in feet at night that wakes you up from your sleep:     Blood clot in your veins:    Leg swelling:         Pulmonary    Oxygen at home:    Productive cough:     Wheezing:         Neurologic    Sudden weakness in arms or legs:     Sudden numbness in arms or legs:     Sudden onset of difficulty speaking or slurred speech:    Temporary loss of vision in one eye:     Problems with dizziness:         Gastrointestinal    Blood in stool:     Vomited blood:         Genitourinary    Burning when urinating:     Blood in urine:        Psychiatric    Major depression:         Hematologic    Bleeding problems:    Problems with blood clotting too easily:        Skin    Rashes or ulcers:        Constitutional    Fever or chills:     PHYSICAL EXAM:   Vitals:   12/05/19 0933  BP: (!) 143/68    Pulse: 75  Resp: 20  Temp: 97.9 F (36.6 C)  SpO2: 98%  Weight: 255 lb (115.7 kg)  Height: 6' 2"  (1.88 m)    GENERAL: The patient is a well-nourished male, in no acute distress. The vital signs are documented above. CARDIAC: There is a regular rate and rhythm.  VASCULAR: I do not detect carotid bruits. He has palpable brachial and radial pulses bilaterally. PULMONARY: There is good air exchange bilaterally without wheezing or rales. ABDOMEN: Soft and non-tender with normal pitched bowel sounds.  MUSCULOSKELETAL: He has a left below the knee amputation. NEUROLOGIC: No focal weakness or paresthesias are detected. SKIN: There are no ulcers or rashes noted. PSYCHIATRIC: The patient has a normal affect.  DATA:    UPPER EXTREMITY ARTERIAL DUPLEX: I have independently interpreted his upper extremity arterial duplex scan today.  On the right side there is a triphasic radial and ulnar waveform.  The brachial artery measures 0.61 cm in diameter.  On the left side there is a triphasic radial and ulnar waveform.  The brachial artery measures 0.57 cm in diameter.  UPPER EXTREMITY VEIN MAP: I have independently interpreted his upper extremity vein map today.  On the right side the forearm and upper arm cephalic vein are quite small and do not appear to be usable for fistula.  The basilic vein is also small.  On the left  side the forearm cephalic vein is small.  The upper arm cephalic vein likewise is small narrows down to 0.1 cm.  The basilic vein is marginal in size with diameters ranging from 0.31-0.33 cm.  LABS: I reviewed his labs from 11/13/2019.  GFR was 11

## 2019-12-06 ENCOUNTER — Other Ambulatory Visit: Payer: Self-pay

## 2019-12-07 DIAGNOSIS — I5042 Chronic combined systolic (congestive) and diastolic (congestive) heart failure: Secondary | ICD-10-CM | POA: Diagnosis not present

## 2019-12-07 DIAGNOSIS — E875 Hyperkalemia: Secondary | ICD-10-CM | POA: Diagnosis not present

## 2019-12-07 DIAGNOSIS — E1129 Type 2 diabetes mellitus with other diabetic kidney complication: Secondary | ICD-10-CM | POA: Diagnosis not present

## 2019-12-07 DIAGNOSIS — N189 Chronic kidney disease, unspecified: Secondary | ICD-10-CM | POA: Diagnosis not present

## 2019-12-07 DIAGNOSIS — E872 Acidosis: Secondary | ICD-10-CM | POA: Diagnosis not present

## 2019-12-07 DIAGNOSIS — I129 Hypertensive chronic kidney disease with stage 1 through stage 4 chronic kidney disease, or unspecified chronic kidney disease: Secondary | ICD-10-CM | POA: Diagnosis not present

## 2019-12-07 DIAGNOSIS — N185 Chronic kidney disease, stage 5: Secondary | ICD-10-CM | POA: Diagnosis not present

## 2019-12-08 ENCOUNTER — Other Ambulatory Visit (HOSPITAL_COMMUNITY)
Admission: RE | Admit: 2019-12-08 | Discharge: 2019-12-08 | Disposition: A | Discharge status: Home / Self Care | Payer: Medicare Other | Source: Ambulatory Visit | Attending: Vascular Surgery | Admitting: Vascular Surgery

## 2019-12-08 DIAGNOSIS — Z01812 Encounter for preprocedural laboratory examination: Secondary | ICD-10-CM | POA: Insufficient documentation

## 2019-12-08 DIAGNOSIS — Z20822 Contact with and (suspected) exposure to covid-19: Secondary | ICD-10-CM | POA: Diagnosis not present

## 2019-12-08 LAB — SARS CORONAVIRUS 2 (TAT 6-24 HRS): SARS Coronavirus 2: NEGATIVE

## 2019-12-10 MED ORDER — VANCOMYCIN HCL 1500 MG/300ML IV SOLN
1500.0000 mg | Freq: Once | INTRAVENOUS | Status: DC
  Filled 2019-12-10: qty 300

## 2019-12-10 NOTE — Progress Notes (Signed)
Unable to reach pt and his wife for pre-op call. Tried numerous times, all numbers listed. Left detailed instructions on his voicemail and his wife's.   Instructed pt to take 1/2 of his regular dose of Lantus Insulin if he needs to use his insulin (pt only takes the Lantus if his blood sugar is greater than 200). Instructed pt and wife to check pt's blood sugar in the AM when he gets up and every 2 hours until he leaves for the hospital. If blood sugar is 70 or below, treat with 1/2 cup of clear juice (apple or cranberry) and recheck blood sugar 15 minutes after drinking juice.  Covid test done 12/08/19 and it's negative.

## 2019-12-10 NOTE — Progress Notes (Signed)
Dillard Pascal SDW  Your procedure is scheduled on Tuesday April 13.  Report to [facility] Main Entrance "A" at 09:00 A.M., and check in at the Admitting office.  Call this number if you have problems the morning of surgery: [phone]  Call [phone] if you have any questions prior to your surgery date Monday-Friday 8am-4pm   Remember: Do not eat or drink after midnight the night before your surgery  Take these medicines the morning of surgery with A SIP OF WATER: amLODipine (NORVASC)  atorvastatin (LIPITOR)  carvedilol (COREG) hydrALAZINE (APRESOLINE)  Follow your surgeon's instructions on when to stop Aspirin.  If no instructions were given by your surgeon then you will need to call the office to get those instructions.     As of today, STOP taking any Aleve, Naproxen, Ibuprofen, Motrin, Advil, Goody's, BC's, all herbal medications, fish oil, and all vitamins.   WHAT DO I DO ABOUT MY DIABETES MEDICATION?   Marland Kitchen Do not take oral diabetes medicines (pills) the morning of surgery.  . THE NIGHT BEFORE SURGERY o Insulin Glargine (LANTUS SOLOSTAR) - 5 units   . THE MORNING OF SURGERY o Insulin Glargine (LANTUS SOLOSTAR) - 5 units  . If your CBG is greater than 220 mg/dL, you may take  of your sliding scale (correction) dose of insulin.   HOW TO MANAGE YOUR DIABETES BEFORE AND AFTER SURGERY  Why is it important to control my blood sugar before and after surgery? . Improving blood sugar levels before and after surgery helps healing and can limit problems. . A way of improving blood sugar control is eating a healthy diet by: o  Eating less sugar and carbohydrates o  Increasing activity/exercise o  Talking with your doctor about reaching your blood sugar goals . High blood sugars (greater than 180 mg/dL) can raise your risk of infections and slow your recovery, so you will need to focus on controlling your diabetes during the weeks before surgery. . Make sure that the  doctor who takes care of your diabetes knows about your planned surgery including the date and location.  How do I manage my blood sugar before surgery? . Check your blood sugar at least 4 times a day, starting 2 days before surgery, to make sure that the level is not too high or low. . Check your blood sugar the morning of your surgery when you wake up and every 2 hours until you get to the Short Stay unit. o If your blood sugar is less than 70 mg/dL, you will need to treat for low blood sugar: - Do not take insulin. - Treat a low blood sugar (less than 70 mg/dL) with  cup of clear juice (cranberry or apple), 4 glucose tablets, OR glucose gel. - Recheck blood sugar in 15 minutes after treatment (to make sure it is greater than 70 mg/dL). If your blood sugar is not greater than 70 mg/dL on recheck, call [phone] for further instructions. . Report your blood sugar to the short stay nurse when you get to Short Stay     The Morning of Surgery  Do not wear jewelry  Do not wear lotions, powders, colognes, or deodorant   Men may shave face and neck.  Do not bring valuables to the hospital.  [facility] is not responsible for any belongings or valuables.  If you are a smoker, DO NOT Smoke 24 hours prior to surgery  If you wear a CPAP at night please bring your mask the  morning of surgery   Remember that you must have someone to transport you home after your surgery, and remain with you for 24 hours if you are discharged the same day.   Please bring cases for contacts, glasses, hearing aids, dentures or bridgework because it cannot be worn into surgery.    Leave your suitcase in the car.  After surgery it may be brought to your room.  For patients admitted to the hospital, discharge time will be determined by your treatment team.  Patients discharged the day of surgery will not be allowed to drive home.    Day of Surgery:  Please shower the morning of surgery Do not apply any  deodorants/lotions. Please wear clean clothes to the hospital/surgery center.   Remember to brush your teeth WITH YOUR REGULAR TOOTHPASTE.   Please read over the following fact sheets that you were given.

## 2019-12-11 ENCOUNTER — Ambulatory Visit (HOSPITAL_COMMUNITY): Admission: RE | Admit: 2019-12-11 | Payer: Medicare Other | Source: Home / Self Care | Admitting: Vascular Surgery

## 2019-12-11 ENCOUNTER — Encounter (HOSPITAL_COMMUNITY): Payer: Medicare Other

## 2019-12-11 SURGERY — TRANSPOSITION, VEIN, BASILIC
Anesthesia: Monitor Anesthesia Care | Laterality: Left

## 2019-12-11 NOTE — Progress Notes (Signed)
Patient called and informed secretary that he was not coming in for his procedure today.  Alana at [facility] desk notified and will call Dr. Scot Dock

## 2019-12-12 DIAGNOSIS — L97519 Non-pressure chronic ulcer of other part of right foot with unspecified severity: Secondary | ICD-10-CM | POA: Diagnosis not present

## 2019-12-12 DIAGNOSIS — Z89512 Acquired absence of left leg below knee: Secondary | ICD-10-CM | POA: Diagnosis not present

## 2019-12-12 DIAGNOSIS — I739 Peripheral vascular disease, unspecified: Secondary | ICD-10-CM | POA: Diagnosis not present

## 2019-12-12 DIAGNOSIS — M205X1 Other deformities of toe(s) (acquired), right foot: Secondary | ICD-10-CM | POA: Diagnosis not present

## 2019-12-12 DIAGNOSIS — L84 Corns and callosities: Secondary | ICD-10-CM | POA: Diagnosis not present

## 2019-12-17 DIAGNOSIS — R0600 Dyspnea, unspecified: Secondary | ICD-10-CM | POA: Diagnosis not present

## 2019-12-17 DIAGNOSIS — Z01818 Encounter for other preprocedural examination: Secondary | ICD-10-CM | POA: Diagnosis not present

## 2019-12-19 ENCOUNTER — Encounter: Payer: Self-pay | Admitting: Internal Medicine

## 2019-12-19 DIAGNOSIS — R0600 Dyspnea, unspecified: Secondary | ICD-10-CM | POA: Diagnosis not present

## 2019-12-19 DIAGNOSIS — Z01818 Encounter for other preprocedural examination: Secondary | ICD-10-CM | POA: Diagnosis not present

## 2019-12-19 NOTE — Progress Notes (Signed)
Contacted [facility] to see if pt has medical release. Was sent to the medical record department lvm asking medical records to give me a call back at their earliest convenience

## 2019-12-19 NOTE — Progress Notes (Unsigned)
I received office note from [facility] surgical [facility].  Patient was seen there 12/03/2019.  Patient is scheduled for right femoropopliteal bypass graft due to peripheral vascular disease and slow healing ulcers on the first second and third toes.  However on the assessment and plan it is said that patient had intervention to the right SFA and that he now has wound healing so the plan is not to proceed with surgery at this time.  Patient is to continue going to wound care treatment and will have repeat lower extremity arterial Doppler study in 1 month. They requested that we fax patient's last few office notes.  I will have my CMA get permission from the patient to do so.

## 2019-12-21 NOTE — Progress Notes (Signed)
Contacted [facility] to see if pt has medical release. Lvm asking medical records to give me a call back at their earliest convenience

## 2019-12-21 NOTE — Progress Notes (Signed)
Trudi Ida from [facility] returned call regarding pt and per Rodena Piety pt doesn't have a Medical release form on file. Will contact pt to see if he is able to come in and sign one

## 2019-12-21 NOTE — Progress Notes (Signed)
Got in touch with pt regarding Medical Release form. Pt request that he would like form mailed to him for competition and he will mail it back. Will mail pt medical release form

## 2019-12-26 ENCOUNTER — Encounter: Payer: Self-pay | Admitting: Internal Medicine

## 2019-12-26 NOTE — Progress Notes (Signed)
I received note from nurse practitioner Juanell Fairly of [facility].  Patient was seen 11/01/2019.  Patient with stage V CKD with BUN/creatinine of 83/6.67 and GFR of 9.  Patient was referred to vascular surgeon for permanent access.  Blood pressure, volume status and potassium were elevated.  Furosemide increased to 80 mg daily.  Sodium bicarbonate 650 mg 2 tabs twice a day started.  Patient given samples of Lokelma 10 g daily for 3 days to address the high potassium. Sent back to infusion clinic to restart Retacrit due to anemia of chronic disease.  H&H 8.3/25.8. History of IgM MGUS, last seen by Dr. Burr Medico 02/26/2019.

## 2019-12-27 ENCOUNTER — Other Ambulatory Visit: Payer: Self-pay

## 2019-12-27 ENCOUNTER — Encounter: Payer: Self-pay | Admitting: Internal Medicine

## 2019-12-27 ENCOUNTER — Ambulatory Visit: Payer: Medicare Other | Attending: Internal Medicine | Admitting: Internal Medicine

## 2019-12-27 VITALS — BP 173/89 | HR 76 | Temp 97.5°F | Resp 16 | Wt 250.6 lb

## 2019-12-27 DIAGNOSIS — Z794 Long term (current) use of insulin: Secondary | ICD-10-CM | POA: Diagnosis not present

## 2019-12-27 DIAGNOSIS — G546 Phantom limb syndrome with pain: Secondary | ICD-10-CM

## 2019-12-27 DIAGNOSIS — I10 Essential (primary) hypertension: Secondary | ICD-10-CM | POA: Diagnosis not present

## 2019-12-27 DIAGNOSIS — N185 Chronic kidney disease, stage 5: Secondary | ICD-10-CM | POA: Diagnosis not present

## 2019-12-27 DIAGNOSIS — G40909 Epilepsy, unspecified, not intractable, without status epilepticus: Secondary | ICD-10-CM | POA: Diagnosis not present

## 2019-12-27 DIAGNOSIS — I739 Peripheral vascular disease, unspecified: Secondary | ICD-10-CM

## 2019-12-27 DIAGNOSIS — E1122 Type 2 diabetes mellitus with diabetic chronic kidney disease: Secondary | ICD-10-CM | POA: Diagnosis not present

## 2019-12-27 DIAGNOSIS — Z89421 Acquired absence of other right toe(s): Secondary | ICD-10-CM

## 2019-12-27 DIAGNOSIS — I5032 Chronic diastolic (congestive) heart failure: Secondary | ICD-10-CM

## 2019-12-27 DIAGNOSIS — D631 Anemia in chronic kidney disease: Secondary | ICD-10-CM

## 2019-12-27 DIAGNOSIS — Z89512 Acquired absence of left leg below knee: Secondary | ICD-10-CM | POA: Diagnosis not present

## 2019-12-27 DIAGNOSIS — D472 Monoclonal gammopathy: Secondary | ICD-10-CM

## 2019-12-27 LAB — POCT GLYCOSYLATED HEMOGLOBIN (HGB A1C): HbA1c, POC (controlled diabetic range): 6.8 % (ref 0.0–7.0)

## 2019-12-27 LAB — GLUCOSE, POCT (MANUAL RESULT ENTRY): POC Glucose: 118 mg/dl — AB (ref 70–99)

## 2019-12-27 MED ORDER — GABAPENTIN 100 MG PO CAPS: 100.0000 mg | ORAL_CAPSULE | Freq: Every day | ORAL | 3 refills | Status: DC

## 2019-12-27 MED ORDER — HYDRALAZINE HCL 25 MG PO TABS: 25.0000 mg | ORAL_TABLET | Freq: Two times a day (BID) | ORAL | 3 refills | Status: DC
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
## 2019-12-27 NOTE — Patient Instructions (Signed)
Please check your blood pressure daily.  The goal is 130/80 or lower.  Continue to limit salt in the foods.

## 2020-01-01 DIAGNOSIS — I359 Nonrheumatic aortic valve disorder, unspecified: Secondary | ICD-10-CM | POA: Diagnosis not present

## 2020-01-01 DIAGNOSIS — I517 Cardiomegaly: Secondary | ICD-10-CM | POA: Diagnosis not present

## 2020-01-07 DIAGNOSIS — E1122 Type 2 diabetes mellitus with diabetic chronic kidney disease: Secondary | ICD-10-CM | POA: Diagnosis not present

## 2020-01-07 DIAGNOSIS — N179 Acute kidney failure, unspecified: Secondary | ICD-10-CM | POA: Diagnosis not present

## 2020-01-07 DIAGNOSIS — D631 Anemia in chronic kidney disease: Secondary | ICD-10-CM | POA: Diagnosis not present

## 2020-01-07 DIAGNOSIS — N185 Chronic kidney disease, stage 5: Secondary | ICD-10-CM | POA: Diagnosis not present

## 2020-01-07 DIAGNOSIS — I120 Hypertensive chronic kidney disease with stage 5 chronic kidney disease or end stage renal disease: Secondary | ICD-10-CM | POA: Diagnosis not present

## 2020-01-07 DIAGNOSIS — N189 Chronic kidney disease, unspecified: Secondary | ICD-10-CM | POA: Diagnosis not present

## 2020-01-08 DIAGNOSIS — N185 Chronic kidney disease, stage 5: Secondary | ICD-10-CM | POA: Diagnosis not present

## 2020-01-09 DIAGNOSIS — E1351 Other specified diabetes mellitus with diabetic peripheral angiopathy without gangrene: Secondary | ICD-10-CM | POA: Diagnosis not present

## 2020-01-09 DIAGNOSIS — Z89512 Acquired absence of left leg below knee: Secondary | ICD-10-CM | POA: Diagnosis not present

## 2020-01-09 DIAGNOSIS — I739 Peripheral vascular disease, unspecified: Secondary | ICD-10-CM | POA: Diagnosis not present

## 2020-01-09 DIAGNOSIS — M205X1 Other deformities of toe(s) (acquired), right foot: Secondary | ICD-10-CM | POA: Diagnosis not present

## 2020-01-10 ENCOUNTER — Telehealth: Payer: Self-pay

## 2020-01-10 DIAGNOSIS — E875 Hyperkalemia: Secondary | ICD-10-CM | POA: Diagnosis not present

## 2020-01-10 NOTE — Telephone Encounter (Signed)
Call made to patient to attempt to reschedule him for his left 1st stage basilic vein transposition versus arteriovenous graft with Dr. Scot Dock. Patient stated he told Dr. Marval Regal that he did not want to proceed with surgery or dialysis at this time. Will update Dr. Scot Dock with information. Minette Brine, RN

## 2020-01-11 DIAGNOSIS — Z89512 Acquired absence of left leg below knee: Secondary | ICD-10-CM | POA: Diagnosis not present

## 2020-01-11 DIAGNOSIS — E1351 Other specified diabetes mellitus with diabetic peripheral angiopathy without gangrene: Secondary | ICD-10-CM | POA: Diagnosis not present

## 2020-01-11 DIAGNOSIS — I879 Disorder of vein, unspecified: Secondary | ICD-10-CM | POA: Diagnosis not present

## 2020-01-11 DIAGNOSIS — I739 Peripheral vascular disease, unspecified: Secondary | ICD-10-CM | POA: Diagnosis not present

## 2020-02-01 ENCOUNTER — Other Ambulatory Visit: Payer: Self-pay

## 2020-02-01 DIAGNOSIS — Z794 Long term (current) use of insulin: Secondary | ICD-10-CM

## 2020-02-01 DIAGNOSIS — E118 Type 2 diabetes mellitus with unspecified complications: Secondary | ICD-10-CM

## 2020-02-01 MED ORDER — ONETOUCH VERIO VI STRP: ORAL_STRIP | 12 refills | Status: DC

## 2020-02-12 ENCOUNTER — Telehealth: Payer: Self-pay | Admitting: Orthopedic Surgery

## 2020-02-12 NOTE — Telephone Encounter (Signed)
The pt called stating Mark [name] with [facility] told him he needs to have Dr.Duda send over a rx for a new prosthetic however he did not give the pt a fax number or any other way for Dr.Duda to send the rx.   [phone]

## 2020-02-12 NOTE — Telephone Encounter (Signed)
Left BKA can you write order for this please and I will fax
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
## 2020-02-18 ENCOUNTER — Inpatient Hospital Stay: Payer: Medicare Other | Attending: Hematology

## 2020-02-18 ENCOUNTER — Telehealth: Payer: Self-pay | Admitting: Emergency Medicine

## 2020-02-18 ENCOUNTER — Other Ambulatory Visit: Payer: Self-pay

## 2020-02-18 DIAGNOSIS — D472 Monoclonal gammopathy: Secondary | ICD-10-CM | POA: Diagnosis present

## 2020-02-18 DIAGNOSIS — E875 Hyperkalemia: Secondary | ICD-10-CM | POA: Insufficient documentation

## 2020-02-18 DIAGNOSIS — I132 Hypertensive heart and chronic kidney disease with heart failure and with stage 5 chronic kidney disease, or end stage renal disease: Secondary | ICD-10-CM | POA: Insufficient documentation

## 2020-02-18 DIAGNOSIS — N185 Chronic kidney disease, stage 5: Secondary | ICD-10-CM | POA: Diagnosis not present

## 2020-02-18 DIAGNOSIS — I509 Heart failure, unspecified: Secondary | ICD-10-CM | POA: Insufficient documentation

## 2020-02-18 DIAGNOSIS — E1122 Type 2 diabetes mellitus with diabetic chronic kidney disease: Secondary | ICD-10-CM | POA: Insufficient documentation

## 2020-02-18 LAB — CMP (CANCER CENTER ONLY)
ALT: 8 U/L (ref 0–44)
AST: <6 U/L — ABNORMAL LOW (ref 15–41)
Albumin: 3.3 g/dL — ABNORMAL LOW (ref 3.5–5.0)
Alkaline Phosphatase: 74 U/L (ref 38–126)
Anion gap: 11 (ref 5–15)
BUN: 75 mg/dL — ABNORMAL HIGH (ref 8–23)
CO2: 18 mmol/L — ABNORMAL LOW (ref 22–32)
Calcium: 9 mg/dL (ref 8.9–10.3)
Chloride: 114 mmol/L — ABNORMAL HIGH (ref 98–111)
Creatinine: 7.86 mg/dL (ref 0.61–1.24)
GFR, Est AFR Am: 7 mL/min — ABNORMAL LOW (ref >60)
GFR, Estimated: 6 mL/min — ABNORMAL LOW (ref >60)
Glucose, Bld: 217 mg/dL — ABNORMAL HIGH (ref 70–99)
Potassium: 5.6 mmol/L — ABNORMAL HIGH (ref 3.5–5.1)
Sodium: 143 mmol/L (ref 135–145)
Total Bilirubin: 0.3 mg/dL (ref 0.3–1.2)
Total Protein: 7.6 g/dL (ref 6.5–8.1)

## 2020-02-18 LAB — CBC WITH DIFFERENTIAL (CANCER CENTER ONLY)
Abs Immature Granulocytes: 0.01 10*3/uL (ref 0.00–0.07)
Basophils Absolute: 0 10*3/uL (ref 0.0–0.1)
Basophils Relative: 1 %
Eosinophils Absolute: 0.5 10*3/uL (ref 0.0–0.5)
Eosinophils Relative: 7 %
HCT: 27 % — ABNORMAL LOW (ref 39.0–52.0)
Hemoglobin: 8.5 g/dL — ABNORMAL LOW (ref 13.0–17.0)
Immature Granulocytes: 0 %
Lymphocytes Relative: 42 %
Lymphs Abs: 2.7 10*3/uL (ref 0.7–4.0)
MCH: 30.6 pg (ref 26.0–34.0)
MCHC: 31.5 g/dL (ref 30.0–36.0)
MCV: 97.1 fL (ref 80.0–100.0)
Monocytes Absolute: 0.5 10*3/uL (ref 0.1–1.0)
Monocytes Relative: 8 %
Neutro Abs: 2.8 10*3/uL (ref 1.7–7.7)
Neutrophils Relative %: 42 %
Platelet Count: 292 10*3/uL (ref 150–400)
RBC: 2.78 MIL/uL — ABNORMAL LOW (ref 4.22–5.81)
RDW: 13.2 % (ref 11.5–15.5)
WBC Count: 6.4 10*3/uL (ref 4.0–10.5)
nRBC: 0 % (ref 0.0–0.2)

## 2020-02-18 NOTE — Telephone Encounter (Signed)
PT lab results today reviewed with Dr. Benay Spice. Potassium 5.6 and creatine 7.86. PT is not currently on dialysis according to Dr. Merlene Laughter nurse at [facility]. Lab results faxed to the office. They will address this per nurse at [facility].

## 2020-02-19 LAB — KAPPA/LAMBDA LIGHT CHAINS
Kappa free light chain: 234.9 mg/L — ABNORMAL HIGH (ref 3.3–19.4)
Kappa, lambda light chain ratio: 2.88 — ABNORMAL HIGH (ref 0.26–1.65)
Lambda free light chains: 81.5 mg/L — ABNORMAL HIGH (ref 5.7–26.3)

## 2020-02-22 LAB — MULTIPLE MYELOMA PANEL, SERUM
Albumin SerPl Elph-Mcnc: 3.2 g/dL (ref 2.9–4.4)
Albumin/Glob SerPl: 0.9 (ref 0.7–1.7)
Alpha 1: 0.2 g/dL (ref 0.0–0.4)
Alpha2 Glob SerPl Elph-Mcnc: 0.8 g/dL (ref 0.4–1.0)
B-Globulin SerPl Elph-Mcnc: 0.9 g/dL (ref 0.7–1.3)
Gamma Glob SerPl Elph-Mcnc: 1.7 g/dL (ref 0.4–1.8)
Globulin, Total: 3.6 g/dL (ref 2.2–3.9)
IgA: 203 mg/dL (ref 61–437)
IgG (Immunoglobin G), Serum: 1620 mg/dL — ABNORMAL HIGH (ref 603–1613)
IgM (Immunoglobulin M), Srm: 346 mg/dL — ABNORMAL HIGH (ref 20–172)
M Protein SerPl Elph-Mcnc: 0.5 g/dL — ABNORMAL HIGH
Total Protein ELP: 6.8 g/dL (ref 6.0–8.5)

## 2020-02-25 ENCOUNTER — Inpatient Hospital Stay (HOSPITAL_BASED_OUTPATIENT_CLINIC_OR_DEPARTMENT_OTHER): Payer: Medicare Other | Admitting: Hematology

## 2020-02-25 ENCOUNTER — Other Ambulatory Visit: Payer: Medicare Other

## 2020-02-25 ENCOUNTER — Other Ambulatory Visit: Payer: Self-pay

## 2020-02-25 VITALS — BP 167/71 | HR 78 | Temp 98.5°F | Resp 20 | Ht 74.0 in | Wt 251.1 lb

## 2020-02-25 DIAGNOSIS — D472 Monoclonal gammopathy: Secondary | ICD-10-CM | POA: Diagnosis not present

## 2020-02-27 ENCOUNTER — Encounter: Payer: Self-pay | Admitting: Hematology

## 2020-03-13 LAB — HM DIABETES EYE EXAM

## 2020-03-18 ENCOUNTER — Encounter: Payer: Self-pay | Admitting: Internal Medicine

## 2020-03-18 DIAGNOSIS — E113299 Type 2 diabetes mellitus with mild nonproliferative diabetic retinopathy without macular edema, unspecified eye: Secondary | ICD-10-CM | POA: Insufficient documentation

## 2020-03-27 ENCOUNTER — Other Ambulatory Visit: Payer: Self-pay

## 2020-03-27 ENCOUNTER — Telehealth: Payer: Self-pay | Admitting: Internal Medicine

## 2020-03-27 ENCOUNTER — Ambulatory Visit: Payer: Medicare Other | Attending: Internal Medicine | Admitting: Internal Medicine

## 2020-03-27 DIAGNOSIS — I10 Essential (primary) hypertension: Secondary | ICD-10-CM | POA: Diagnosis not present

## 2020-03-27 DIAGNOSIS — D472 Monoclonal gammopathy: Secondary | ICD-10-CM | POA: Diagnosis not present

## 2020-03-27 DIAGNOSIS — D631 Anemia in chronic kidney disease: Secondary | ICD-10-CM

## 2020-03-27 DIAGNOSIS — I5032 Chronic diastolic (congestive) heart failure: Secondary | ICD-10-CM

## 2020-03-27 DIAGNOSIS — E1122 Type 2 diabetes mellitus with diabetic chronic kidney disease: Secondary | ICD-10-CM | POA: Diagnosis not present

## 2020-03-27 DIAGNOSIS — Z794 Long term (current) use of insulin: Secondary | ICD-10-CM

## 2020-03-27 DIAGNOSIS — N185 Chronic kidney disease, stage 5: Secondary | ICD-10-CM

## 2020-03-27 MED ORDER — CARVEDILOL 25 MG PO TABS: 25.0000 mg | ORAL_TABLET | Freq: Two times a day (BID) | ORAL | 6 refills | Status: DC

## 2020-03-27 NOTE — Telephone Encounter (Signed)
Called to get clarification on the dosage of medication for carvedilol (COREG) 25 MG tablet.  Please call to discuss at [phone]

## 2020-03-27 NOTE — Progress Notes (Signed)
Virtual Visit via Telephone Note Due to current restrictions/limitations of in-office visits due to the COVID-19 pandemic, this scheduled clinical appointment was converted to a telehealth visit  I connected with Mark [name] on 03/27/20 at 12:21 p.m by telephone and verified that I am speaking with the correct person using two identifiers. I am in my office.  The patient is at home.  Only the patient and myself participated in this encounter.  I discussed the limitations, risks, security and privacy concerns of performing an evaluation and management service by telephone and the availability of in person appointments. I also discussed with the patient that there may be a patient responsible charge related to this service. The patient expressed understanding and agreed to proceed.   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  Today's visit is for chronic ds management   Ig M MGUS: saw Dr. Burr Medico in f/u 02/25/2020.  Bone survey ordered.   HYPERTENSION/dCHF Currently taking: see medication list Med Adherence: [x]  Yes    []  No Medication side effects: []  Yes    [x]  No Adherence with salt restriction: [x]  Yes.  "I threw away the salt shaker."   Home Monitoring?: [x]  Yes    []  No Monitoring Frequency:  daily Home BP results range:  BP this a.m was 146/64 after taking meds.  Saw his cardiologist Dr. Shirlee More 03/20/2020.  Coreg increase to 25 mg BID. SOB? []  Yes    [x]  No Chest Pain?: []  Yes    [x]  No Leg swelling?: []  Yes    [x]  No Headaches?: []  Yes    [x]  No Dizziness? []  Yes    [x]  No Comments:   CKD 5:  Saw her kidney specialist yesterday.  No changes in meds.  AV graft recommended but pt has not made up his mind to have it done.  He tells me that the  kidney specialist has ordered the Retacrit but he has not called the hosp to start getting the shot.  DM:  Checking the BS 1-2 x/day.  7 day avg is 144, 14 day avg is 123, 30 day avg is 122.   Reports compliance with Lantus 10 units but does not take if BS are low in the evening.  Doing okay with eating habits.   Outpatient Encounter Medications as of 03/27/2020  Medication Sig  . amLODipine (NORVASC) 10 MG tablet Take 1 tablet (10 mg total) by mouth daily.  Marland Kitchen aspirin EC 81 MG tablet Take 1 tablet (81 mg total) by mouth daily.  Marland Kitchen atorvastatin (LIPITOR) 20 MG tablet TAKE 1 TABLET BY MOUTH  DAILY  . bisacodyl (DULCOLAX) 5 MG EC tablet Take 5 mg by mouth daily as needed for moderate constipation.  . Blood Glucose Monitoring Suppl (ONETOUCH VERIO) w/Device KIT Use as directed to test blood sugar three times daily.  . calcitRIOL (ROCALTROL) 0.25 MCG capsule Take 0.25 mcg by mouth daily.  . carvedilol (COREG) 12.5 MG tablet TAKE 1.5 TABLET BY MOUTH 2 TIMES DAILY WITH A MEAL.  . cholecalciferol (VITAMIN D3) 25 MCG (1000 UNIT) tablet Take 1,000 Units by mouth daily.  . ferrous gluconate (FERGON) 324 MG tablet Take 1 tablet (324 mg total) by mouth 2 (two) times daily with a meal.  .  furosemide (LASIX) 80 MG tablet Take 80 mg by mouth daily.  Marland Kitchen gabapentin (NEURONTIN) 100 MG capsule Take 1 capsule (100 mg total) by mouth at bedtime.  Marland Kitchen glucose blood (ONETOUCH VERIO) test strip Use as directed to test blood sugar three times daily.  . hydrALAZINE (APRESOLINE) 25 MG tablet Take 1 tablet (25 mg total) by mouth 2 (two) times daily.  . Insulin Glargine (LANTUS SOLOSTAR) 100 UNIT/ML Solostar Pen Inject 10 Units into the skin at bedtime. (Patient taking differently: Inject 10 Units into the skin at bedtime as needed (>200). )  . Insulin Pen Needle (TRUEPLUS PEN NEEDLES) 32G X 4 MM MISC Use as directed to administer lantus daily  . Insulin Syringe-Needle U-100 (INSULIN SYRINGE .5CC/30GX5/16") 30G X 5/16" 0.5 ML  MISC Check blood sugar TID & QHS  . Multiple Vitamin (MULTIVITAMIN WITH MINERALS) TABS tablet Take 1 tablet by mouth daily.  [name] Delica Lancets 38V MISC Use as directed to test blood sugar three times daily.  Marland Kitchen OVER THE COUNTER MEDICATION Take 1 tablet by mouth daily. Kidney Factors  . sodium bicarbonate 650 MG tablet Take 1,300 mg by mouth 2 (two) times daily.   Facility-Administered Encounter Medications as of 03/27/2020  Medication  . vancomycin (VANCOREADY) IVPB 1500 mg/300 mL    Observations/Objective: Lab Results  Component Value Date   WBC 6.4 02/18/2020   HGB 8.5 (L) 02/18/2020   HCT 27.0 (L) 02/18/2020   MCV 97.1 02/18/2020   PLT 292 02/18/2020     Chemistry      Component Value Date/Time   NA 143 02/18/2020 1357   NA 145 (H) 10/18/2017 1424   K 5.6 (H) 02/18/2020 1357   CL 114 (H) 02/18/2020 1357   CO2 18 (L) 02/18/2020 1357   BUN 75 (H) 02/18/2020 1357   BUN 54 (H) 10/18/2017 1424   CREATININE 7.86 (HH) 02/18/2020 1357   CREATININE 2.16 (H) 11/16/2016 1148   GLU 175 01/14/2016 0000      Component Value Date/Time   CALCIUM 9.0 02/18/2020 1357   ALKPHOS 74 02/18/2020 1357   AST <6 (L) 02/18/2020 1357   ALT 8 02/18/2020 1357   BILITOT 0.3 02/18/2020 1357       Assessment and Plan: 1. Controlled type 2 diabetes mellitus with stage 5 chronic kidney disease not on chronic dialysis, with long-term current use of insulin (HCC) Continue Lantus insulin.  Encourage him to continue trying to eat healthy. Continue follow-up with nephrology.  Encouraged him to make a decision about getting the graft placed sooner rather than later  2. Essential hypertension Recent increase in carvedilol so I have made no additional changes.  Continue carvedilol, amlodipine and hydralazine. - carvedilol (COREG) 25 MG tablet; Take 1 tablet (25 mg total) by mouth 2 (two) times daily with a meal. .  Dispense: 60 tablet; Refill: 6  3. Chronic combined systolic and diastolic  congestive heart failure (HCC) Continue furosemide, carvedilol, - carvedilol (COREG) 25 MG tablet; Take 1 tablet (25 mg total) by mouth 2 (two) times daily with a meal. TAKE 1.5 TABLET BY MOUTH 2 TIMES DAILY WITH A MEAL.  Dispense: 60 tablet; Refill: 6  4. MGUS (monoclonal gammopathy of unknown significance) Followed by oncology.  5. Anemia due to stage 5 chronic kidney disease, not on chronic dialysis ([place]) On iron supplement.  Per his report he is supposed to start Retacrit injections again but he needs to: Schedule the appointment at the hospital where he was getting the shots in  the past.   Follow Up Instructions: 3 mths   I discussed the assessment and treatment plan with the patient. The patient was provided an opportunity to ask questions and all were answered. The patient agreed with the plan and demonstrated an understanding of the instructions.   The patient was advised to call back or seek an in-person evaluation if the symptoms worsen or if the condition fails to improve as anticipated.  I provided 16 minutes of non-face-to-face time during this encounter.   Karle Plumber, MD

## 2020-03-27 NOTE — Telephone Encounter (Signed)
Return call and spoke to Spencer T. And provided clarification for medication

## 2020-04-03 ENCOUNTER — Other Ambulatory Visit (HOSPITAL_COMMUNITY): Payer: Self-pay | Admitting: *Deleted

## 2020-04-04 ENCOUNTER — Ambulatory Visit (HOSPITAL_COMMUNITY)
Admission: RE | Admit: 2020-04-04 | Discharge: 2020-04-04 | Disposition: A | Discharge status: Home / Self Care | Payer: Medicare Other | Source: Ambulatory Visit | Attending: Nephrology | Admitting: Nephrology

## 2020-04-04 ENCOUNTER — Other Ambulatory Visit: Payer: Self-pay

## 2020-04-04 VITALS — BP 149/75 | HR 59 | Temp 97.5°F | Resp 18

## 2020-04-04 DIAGNOSIS — N183 Chronic kidney disease, stage 3 unspecified: Secondary | ICD-10-CM

## 2020-04-04 LAB — POCT HEMOGLOBIN-HEMACUE: Hemoglobin: 8.8 g/dL — ABNORMAL LOW (ref 13.0–17.0)

## 2020-04-04 MED ORDER — EPOETIN ALFA-EPBX 10000 UNIT/ML IJ SOLN
INTRAMUSCULAR | Status: AC
  Administered 2020-04-04: 20000 [IU] via SUBCUTANEOUS
  Filled 2020-04-04: qty 2

## 2020-04-04 MED ORDER — EPOETIN ALFA-EPBX 10000 UNIT/ML IJ SOLN: 20000.0000 [IU] | INTRAMUSCULAR | Status: DC

## 2020-04-16 ENCOUNTER — Ambulatory Visit (HOSPITAL_COMMUNITY)
Admission: RE | Admit: 2020-04-16 | Discharge: 2020-04-16 | Disposition: A | Discharge status: Home / Self Care | Payer: Medicare Other | Source: Ambulatory Visit | Attending: Hematology | Admitting: Hematology

## 2020-04-16 ENCOUNTER — Other Ambulatory Visit: Payer: Self-pay

## 2020-04-16 DIAGNOSIS — D472 Monoclonal gammopathy: Secondary | ICD-10-CM | POA: Insufficient documentation

## 2020-04-21 ENCOUNTER — Telehealth: Payer: Self-pay

## 2020-04-21 NOTE — Telephone Encounter (Signed)
-----   Message from Truitt Merle, MD sent at 04/20/2020  4:44 PM EDT ----- Please let pt know his bone survey was negative for bone lesion. Ask if he has any pulmonary symptoms, and f/u with PCP if he does, thanks   Truitt Merle  04/20/2020

## 2020-04-21 NOTE — Telephone Encounter (Signed)
Spoke with pt about bone scan results and follow up recommendations pt states he understand happy with call pt encouraged to call with any questions concerns or changes

## 2020-04-30 ENCOUNTER — Encounter: Payer: Self-pay | Admitting: Internal Medicine

## 2020-04-30 DIAGNOSIS — H40009 Preglaucoma, unspecified, unspecified eye: Secondary | ICD-10-CM | POA: Insufficient documentation
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
## 2020-05-01 ENCOUNTER — Ambulatory Visit: Payer: Medicare Other | Admitting: Neurology

## 2020-05-02 ENCOUNTER — Other Ambulatory Visit: Payer: Self-pay

## 2020-05-02 ENCOUNTER — Ambulatory Visit (HOSPITAL_COMMUNITY)
Admission: RE | Admit: 2020-05-02 | Discharge: 2020-05-02 | Disposition: A | Discharge status: Home / Self Care | Payer: Medicare Other | Source: Ambulatory Visit | Attending: Nephrology | Admitting: Nephrology

## 2020-05-02 VITALS — BP 140/66 | HR 67 | Temp 97.4°F | Resp 18

## 2020-05-02 DIAGNOSIS — N183 Chronic kidney disease, stage 3 unspecified: Secondary | ICD-10-CM | POA: Insufficient documentation

## 2020-05-02 LAB — POCT HEMOGLOBIN-HEMACUE: Hemoglobin: 9.2 g/dL — ABNORMAL LOW (ref 13.0–17.0)

## 2020-05-02 MED ORDER — EPOETIN ALFA-EPBX 10000 UNIT/ML IJ SOLN
INTRAMUSCULAR | Status: AC
  Administered 2020-05-02: 20000 [IU] via SUBCUTANEOUS
  Filled 2020-05-02: qty 2

## 2020-05-02 MED ORDER — EPOETIN ALFA-EPBX 10000 UNIT/ML IJ SOLN: 20000.0000 [IU] | INTRAMUSCULAR | Status: DC

## 2020-05-14 ENCOUNTER — Encounter: Payer: Self-pay | Admitting: Internal Medicine

## 2020-05-14 NOTE — Progress Notes (Signed)
Patient was seen 04/29/2020 by Dr. Donato Heinz.  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  He had previously decided to pursue dialysis and arrangement was made for AV fistula placement but this was canceled for unclear reasons in April.  Now patient is wavering on whether he wants dialysis at all.  He explained to the patient the different modalities of renal replacement therapy.  However he stated that the patient was not interested in any of them at this time.  He discussed the natural progression of end-stage renal disease without dialysis and he is amendable for palliative care consult.  Referral was placed.  Labs revealed BUN of 62, creatinine of 7, GFR of 8, potassium 4.7.  H/H 9.1/27.8.

## 2020-05-30 ENCOUNTER — Other Ambulatory Visit: Payer: Self-pay

## 2020-05-30 ENCOUNTER — Encounter (HOSPITAL_COMMUNITY)
Admission: RE | Admit: 2020-05-30 | Discharge: 2020-05-30 | Disposition: A | Discharge status: Home / Self Care | Payer: Medicare Other | Source: Ambulatory Visit | Attending: Nephrology | Admitting: Nephrology

## 2020-05-30 VITALS — BP 162/85 | HR 60 | Temp 97.5°F | Resp 18

## 2020-05-30 DIAGNOSIS — N183 Chronic kidney disease, stage 3 unspecified: Secondary | ICD-10-CM | POA: Diagnosis present

## 2020-05-30 DIAGNOSIS — D631 Anemia in chronic kidney disease: Secondary | ICD-10-CM | POA: Insufficient documentation

## 2020-05-30 LAB — IRON AND TIBC
Iron: 59 ug/dL (ref 45–182)
Saturation Ratios: 29 % (ref 17.9–39.5)
TIBC: 203 ug/dL — ABNORMAL LOW (ref 250–450)
UIBC: 144 ug/dL

## 2020-05-30 LAB — COMPREHENSIVE METABOLIC PANEL
ALT: 11 U/L (ref 0–44)
AST: 7 U/L — ABNORMAL LOW (ref 15–41)
Albumin: 3.3 g/dL — ABNORMAL LOW (ref 3.5–5.0)
Alkaline Phosphatase: 53 U/L (ref 38–126)
Anion gap: 10 (ref 5–15)
BUN: 79 mg/dL — ABNORMAL HIGH (ref 8–23)
CO2: 18 mmol/L — ABNORMAL LOW (ref 22–32)
Calcium: 9.5 mg/dL (ref 8.9–10.3)
Chloride: 117 mmol/L — ABNORMAL HIGH (ref 98–111)
Creatinine, Ser: 7.45 mg/dL — ABNORMAL HIGH (ref 0.61–1.24)
GFR calc Af Amer: 8 mL/min — ABNORMAL LOW (ref >60)
GFR calc non Af Amer: 7 mL/min — ABNORMAL LOW (ref >60)
Glucose, Bld: 148 mg/dL — ABNORMAL HIGH (ref 70–99)
Potassium: 4.8 mmol/L (ref 3.5–5.1)
Sodium: 145 mmol/L (ref 135–145)
Total Bilirubin: 0.5 mg/dL (ref 0.3–1.2)
Total Protein: 7.6 g/dL (ref 6.5–8.1)

## 2020-05-30 LAB — FERRITIN: Ferritin: 257 ng/mL (ref 24–336)

## 2020-05-30 LAB — PHOSPHORUS: Phosphorus: 5.1 mg/dL — ABNORMAL HIGH (ref 2.5–4.6)

## 2020-05-30 MED ORDER — EPOETIN ALFA-EPBX 10000 UNIT/ML IJ SOLN
20000.0000 [IU] | INTRAMUSCULAR | Status: DC
  Administered 2020-05-30: 20000 [IU] via SUBCUTANEOUS

## 2020-05-30 MED ORDER — EPOETIN ALFA-EPBX 10000 UNIT/ML IJ SOLN
INTRAMUSCULAR | Status: AC
  Filled 2020-05-30: qty 2

## 2020-06-02 LAB — POCT HEMOGLOBIN-HEMACUE: Hemoglobin: 9.5 g/dL — ABNORMAL LOW (ref 13.0–17.0)

## 2020-06-20 ENCOUNTER — Other Ambulatory Visit: Payer: Self-pay

## 2020-06-20 DIAGNOSIS — N183 Chronic kidney disease, stage 3 unspecified: Secondary | ICD-10-CM

## 2020-06-27 ENCOUNTER — Encounter (HOSPITAL_COMMUNITY)
Admission: RE | Admit: 2020-06-27 | Discharge: 2020-06-27 | Disposition: A | Discharge status: Home / Self Care | Payer: Medicare Other | Source: Ambulatory Visit | Attending: Nephrology | Admitting: Nephrology

## 2020-06-27 ENCOUNTER — Other Ambulatory Visit: Payer: Self-pay

## 2020-06-27 VITALS — BP 165/84 | HR 64 | Temp 98.0°F | Resp 18

## 2020-06-27 DIAGNOSIS — N183 Chronic kidney disease, stage 3 unspecified: Secondary | ICD-10-CM | POA: Diagnosis not present

## 2020-06-27 LAB — POCT HEMOGLOBIN-HEMACUE: Hemoglobin: 10.1 g/dL — ABNORMAL LOW (ref 13.0–17.0)

## 2020-06-27 MED ORDER — EPOETIN ALFA-EPBX 10000 UNIT/ML IJ SOLN
INTRAMUSCULAR | Status: AC
  Administered 2020-06-27: 20000 [IU] via SUBCUTANEOUS
  Filled 2020-06-27: qty 2

## 2020-06-27 MED ORDER — EPOETIN ALFA-EPBX 10000 UNIT/ML IJ SOLN: 20000.0000 [IU] | INTRAMUSCULAR | Status: DC

## 2020-06-30 ENCOUNTER — Telehealth: Payer: Self-pay | Admitting: Neurology

## 2020-06-30 ENCOUNTER — Other Ambulatory Visit: Payer: Self-pay

## 2020-06-30 ENCOUNTER — Encounter: Payer: Medicare Other | Admitting: Neurology

## 2020-06-30 NOTE — Progress Notes (Deleted)
    Virtual Visit via Telephone Note  I connected with Mark [name] on 06/30/20 at 10:15 AM EDT by telephone and verified that I am speaking with the correct person using two identifiers.   I discussed the limitations, risks, security and privacy concerns of performing an evaluation and management service by telephone and the availability of in person appointments. I also discussed with the patient that there may be a patient responsible charge related to this service. The patient expressed understanding and agreed to proceed.   History of Present Illness: 06/30/2020 SS: Mark [name] is a 69 year old male with history of diabetes, peripheral vascular disease, and seizures.  He had a single seizure in 2017 in the setting of hypoglycemia and cocaine use.  He stopped taking Vimpat 200 mg twice a day on his own in 2020, he has remained off the medication.  He reported hallucinations and bad dreams.  He has not had recurrent seizure.  EEG in March 2021 was normal.  MRI of the brain showed mild generalized cortical atrophy, some abnormality in the left temporal area, could possibly lead to seizures.  10/29/2019 SS: Mark [name] is a 69 year old male with history of diabetes, peripheral vascular disease, and seizures.  He had a single seizure event 2017 in the setting of hypoglycemia and cocaine use.  He was supposed to be taking Vimpat 200 mg twice daily, but stopped the medication about a year ago.  He says the medicine gave him hallucinations and bad dreams.  He has not had recurrent seizure, denies cocaine use.  He drives a car.  He has seen a kidney doctor, for elevated creatinine.  He has a left below the knee amputation.  He uses a quad cane.  He presents today for evaluation unaccompanied.  He has not had any recurrent hallucinations or bad dreams since being off Vimpat.  He wishes to remain off seizure medications, if possible.   Observations/Objective:   Assessment and Plan:   Follow Up  Instructions:    I discussed the assessment and treatment plan with the patient. The patient was provided an opportunity to ask questions and all were answered. The patient agreed with the plan and demonstrated an understanding of the instructions.   The patient was advised to call back or seek an in-person evaluation if the symptoms worsen or if the condition fails to improve as anticipated.  I provided *** minutes of non-face-to-face time during this encounter.   Suzzanne Cloud, NP

## 2020-06-30 NOTE — Telephone Encounter (Signed)
I called the patient for 1015 telephone visit, I left a message, I could not reach him.

## 2020-07-01 NOTE — Progress Notes (Signed)
This encounter was created in error - please disregard.

## 2020-07-07 ENCOUNTER — Ambulatory Visit (HOSPITAL_COMMUNITY)
Admission: RE | Admit: 2020-07-07 | Discharge: 2020-07-07 | Disposition: A | Discharge status: Home / Self Care | Payer: Medicare Other | Source: Ambulatory Visit | Attending: Surgery | Admitting: Surgery

## 2020-07-07 ENCOUNTER — Other Ambulatory Visit: Payer: Self-pay

## 2020-07-07 ENCOUNTER — Encounter: Payer: Self-pay | Admitting: Surgery

## 2020-07-07 ENCOUNTER — Ambulatory Visit (INDEPENDENT_AMBULATORY_CARE_PROVIDER_SITE_OTHER): Payer: Medicare Other | Admitting: Surgery

## 2020-07-07 ENCOUNTER — Ambulatory Visit (INDEPENDENT_AMBULATORY_CARE_PROVIDER_SITE_OTHER)
Admission: RE | Admit: 2020-07-07 | Discharge: 2020-07-07 | Disposition: A | Discharge status: Home / Self Care | Payer: Medicare Other | Source: Ambulatory Visit | Attending: Surgery | Admitting: Surgery

## 2020-07-07 VITALS — BP 163/90 | HR 66 | Temp 98.4°F | Resp 20 | Ht 74.0 in | Wt 250.0 lb

## 2020-07-07 DIAGNOSIS — N185 Chronic kidney disease, stage 5: Secondary | ICD-10-CM | POA: Diagnosis not present

## 2020-07-07 DIAGNOSIS — N183 Chronic kidney disease, stage 3 unspecified: Secondary | ICD-10-CM

## 2020-07-07 NOTE — H&P (View-Only) (Signed)
[facility] [place]  Patient name: Mark [name] MRN: [id number] DOB: Sep 10, 1950 Sex: male   REASON FOR VISIT:    Dialysis access  HISOTRY OF PRESENT ILLNESS:    Mark [name] is a 69 y.o. male who has previously been evaluated by Dr. Scot Dock for fistula creation however this never occurred.  He was scheduled for a basilic vein fistula.  He is a right-handed gentleman with renal failure secondary to diabetes.  He also suffers from hypertension and chronic diastolic congestive heart failure.  He denies any history of pacemaker or defibrillator.  He does not take anticoagulation other than aspirin.  He has a history of a below-knee amputation, however he is ambulatory.   PAST MEDICAL HISTORY:   Past Medical History:  Diagnosis Date  . Acid indigestion   . Acute encephalopathy 01/01/2016  . Acute renal failure superimposed on stage 3 chronic kidney disease ([place]) 04/16/2015  . Anemia 10/01/2013  . CHF (congestive heart failure) ([place])   . Chronic kidney disease   . CKD (chronic kidney disease) stage 3, GFR 30-59 ml/min (HCC) 08/18/2015  . Diabetes mellitus, type 2 ([place]) 04/16/2015  . Diverticulitis   . DM (diabetes mellitus), type 2 with peripheral vascular complications ([place])   . Elevated troponin 10/16/2014  . Essential hypertension 04/08/2014  . History of Clostridium difficile colitis 01/01/2016  . Hypertension    no pcp  . Hypothermia 01/01/2016  . Malnutrition of moderate degree ([place]) 04/17/2015  . Onychomycosis of toenail 04/30/2015  . Phantom limb pain ([place]) 12/12/2013  . Positive for microalbuminuria 08/18/2015  . S/P BKA (below knee amputation) ([place]) 11/21/2013   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    . Seizures ([place])   . Spleen absent   . Substance abuse ([place]) 04/02/2016   Cocaine  . Wound infection 01/02/2016     FAMILY HISTORY:   Family History  Problem Relation Age of Onset  . Diabetes Mother     SOCIAL HISTORY:    Social History   Tobacco Use  . Smoking status: Former Smoker    Packs/day: 1.00    Years: 10.00    Pack years: 10.00    Quit date: 08/03/2013    Years since quitting: 6.9  . Smokeless tobacco: Never Used  Substance Use Topics  . Alcohol use: No     ALLERGIES:   No Known Allergies   CURRENT MEDICATIONS:   Current Outpatient Medications  Medication Sig Dispense Refill  . amLODipine (NORVASC) 10 MG tablet Take 1 tablet (10 mg total) by mouth daily. 30 tablet 6  . aspirin EC 81 MG tablet Take 1 tablet (81 mg total) by mouth daily. 30 tablet 5  . atorvastatin (LIPITOR) 20 MG tablet TAKE 1 TABLET BY MOUTH  DAILY 90 tablet 0  . bisacodyl (DULCOLAX) 5 MG EC tablet Take 5 mg by mouth daily as needed for moderate constipation.    . Blood Glucose Monitoring Suppl (ONETOUCH VERIO) w/Device KIT Use as directed to test blood sugar three times daily. 1 kit 0  . calcitRIOL (ROCALTROL) 0.25 MCG capsule Take 0.25 mcg by mouth daily.    . carvedilol (COREG) 25 MG tablet Take 1 tablet (25 mg total) by mouth 2 (two) times daily with a meal. 60 tablet 6  . cholecalciferol (VITAMIN D3) 25 MCG (1000 UNIT) tablet Take 1,000 Units by mouth daily.    . ferrous gluconate (FERGON) 324 MG tablet Take 1 tablet (324 mg total) by mouth 2 (  two) times daily with a meal. 60 tablet 5  . furosemide (LASIX) 80 MG tablet Take 80 mg by mouth daily.    Marland Kitchen gabapentin (NEURONTIN) 100 MG capsule Take 1 capsule (100 mg total) by mouth at bedtime. 30 capsule 3  . glucose blood (ONETOUCH VERIO) test strip Use as directed to test blood sugar three times daily. 100 each 12  . hydrALAZINE (APRESOLINE) 25 MG tablet Take 1 tablet (25 mg total) by mouth 2 (two) times daily. 180 tablet 3  . Insulin Glargine (LANTUS SOLOSTAR) 100 UNIT/ML Solostar Pen Inject 10 Units into the skin at bedtime. (Patient taking differently: Inject 10 Units into the skin at bedtime as needed (>200). ) 15 mL 0  . Insulin Pen Needle (TRUEPLUS PEN  NEEDLES) 32G X 4 MM MISC Use as directed to administer lantus daily 100 each 1  . Insulin Syringe-Needle U-100 (INSULIN SYRINGE .5CC/30GX5/16") 30G X 5/16" 0.5 ML MISC Check blood sugar TID & QHS 100 each 2  . Multiple Vitamin (MULTIVITAMIN WITH MINERALS) TABS tablet Take 1 tablet by mouth daily.    [name] Delica Lancets 00X MISC Use as directed to test blood sugar three times daily. 100 each 12  . OVER THE COUNTER MEDICATION Take 1 tablet by mouth daily. Kidney Factors    . sodium bicarbonate 650 MG tablet Take 1,300 mg by mouth 2 (two) times daily.     No current facility-administered medications for this visit.   Facility-Administered Medications Ordered in Other Visits  Medication Dose Route Frequency Provider Last Rate Last Admin  . vancomycin (VANCOREADY) IVPB 1500 mg/300 mL  1,500 mg Intravenous Once Angelia Mould, MD        REVIEW OF SYSTEMS:   [X]  denotes positive finding, [ ]  denotes negative finding Cardiac  Comments:  Chest pain or chest pressure:    Shortness of breath upon exertion:    Short of breath when lying flat:    Irregular heart rhythm:        Vascular    Pain in calf, thigh, or hip brought on by ambulation:    Pain in feet at night that wakes you up from your sleep:     Blood clot in your veins:    Leg swelling:         Pulmonary    Oxygen at home:    Productive cough:     Wheezing:         Neurologic    Sudden weakness in arms or legs:     Sudden numbness in arms or legs:     Sudden onset of difficulty speaking or slurred speech:    Temporary loss of vision in one eye:     Problems with dizziness:         Gastrointestinal    Blood in stool:     Vomited blood:         Genitourinary    Burning when urinating:     Blood in urine:        Psychiatric    Major depression:         Hematologic    Bleeding problems:    Problems with blood clotting too easily:        Skin    Rashes or ulcers:        Constitutional    Fever or  chills:      PHYSICAL EXAM:   There were no vitals filed for this visit.  GENERAL: The patient is  a well-nourished male, in no acute distress. The vital signs are documented above. CARDIAC: There is a regular rate and rhythm.  VASCULAR: Palpable left radial and brachial pulse PULMONARY: Non-labored respirations  MUSCULOSKELETAL: There are no major deformities or cyanosis. NEUROLOGIC: No focal weakness or paresthesias are detected. SKIN: There are no ulcers or rashes noted. PSYCHIATRIC: The patient has a normal affect.  STUDIES:   I have reviewed the following: Arterial: Triphasic waveforms  +-----------------+-------------+----------+--------------------+  Right Cephalic  Diameter (cm)Depth (cm)   Findings     +-----------------+-------------+----------+--------------------+  Shoulder       0.37                    +-----------------+-------------+----------+--------------------+  Prox upper arm    0.37                    +-----------------+-------------+----------+--------------------+  Mid upper arm    0.23                    +-----------------+-------------+----------+--------------------+  Dist upper arm   0.18 / 0.10      thick walls , branch  +-----------------+-------------+----------+--------------------+  Antecubital fossa  0.22                    +-----------------+-------------+----------+--------------------+  Prox forearm    0.24 / 0.21                  +-----------------+-------------+----------+--------------------+  Mid forearm    0.36 / 0.23                  +-----------------+-------------+----------+--------------------+  Dist forearm     0.25                    +-----------------+-------------+----------+--------------------+    +-----------------+-------------+----------+-------------------------+  Right Basilic  Diameter (cm)Depth (cm)    Findings       +-----------------+-------------+----------+-------------------------+  Shoulder                    not visualized     +-----------------+-------------+----------+-------------------------+  Prox upper arm    0.27                       +-----------------+-------------+----------+-------------------------+  Mid upper arm   0.34 / 0.29      branching and branch 0.32  +-----------------+-------------+----------+-------------------------+  Dist upper arm    0.34                       +-----------------+-------------+----------+-------------------------+  Antecubital fossa  0.32                       +-----------------+-------------+----------+-------------------------+  Prox forearm     0.30                       +-----------------+-------------+----------+-------------------------+   +-----------------+-------------+----------+---------------------------+  Left Cephalic  Diameter (cm)Depth (cm)     Findings       +-----------------+-------------+----------+---------------------------+  Shoulder       0.34                        +-----------------+-------------+----------+---------------------------+  Prox upper arm   0.32 / 0.26                      +-----------------+-------------+----------+---------------------------+  Mid upper arm   0.23 / 0.14                      +-----------------+-------------+----------+---------------------------+  Dist upper arm    0.25                        +-----------------+-------------+----------+---------------------------+  Antecubital fossa   0.15                        +-----------------+-------------+----------+---------------------------+  Prox forearm     0.20                        +-----------------+-------------+----------+---------------------------+  Mid forearm     0.22        branching and br mid to dst  +-----------------+-------------+----------+---------------------------+  Dist forearm     0.19                        +-----------------+-------------+----------+---------------------------+   +-----------------+-------------+----------+--------------+  Left Basilic   Diameter (cm)Depth (cm)  Findings    +-----------------+-------------+----------+--------------+  Shoulder                 not visualized  +-----------------+-------------+----------+--------------+  Prox upper arm              not visualized  +-----------------+-------------+----------+--------------+  Mid upper arm    0.33                 +-----------------+-------------+----------+--------------+  Dist upper arm    0.28        br mid to dst   +-----------------+-------------+----------+--------------+  Antecubital fossa  0.32                 +-----------------+-------------+----------+--------------+  Prox forearm     0.22                 +-----------------+-------------+----------+--------------+ MEDICAL ISSUES:   CKD 5: I discussed with the patient that the ultimate procedure will be determined in the operating room.  We discussed a left cephalic vein fistula versus a basilic vein fistula.  He understands that there is a likelihood of a second operation if the basilic vein is utilized.  I also discussed that if his veins do not appear adequate, a graft would be placed.  I discussed the risk and benefits of the procedure  including the risk of not maturity, the risk of steal syndrome, and the need for additional procedures.  All of his questions were answered.  He will be scheduled in the near future.   Leia Alf, MD, FACS [facility] [phone] Pager [phone]

## 2020-07-07 NOTE — Progress Notes (Signed)
[name]  Patient name: Mark [name] MRN: [id number] DOB: 02-13-1951 Sex: male   REASON FOR VISIT:    Dialysis access  HISOTRY OF PRESENT ILLNESS:    Mark [name] is a 69 y.o. male who has previously been evaluated by Dr. Scot Dock for fistula creation however this never occurred.  He was scheduled for a basilic vein fistula.  He is a right-handed gentleman with renal failure secondary to diabetes.  He also suffers from hypertension and chronic diastolic congestive heart failure.  He denies any history of pacemaker or defibrillator.  He does not take anticoagulation other than aspirin.  He has a history of a below-knee amputation, however he is ambulatory.   PAST MEDICAL HISTORY:   Past Medical History:  Diagnosis Date  . Acid indigestion   . Acute encephalopathy 01/01/2016  . Acute renal failure superimposed on stage 3 chronic kidney disease ([name]) 04/16/2015  . Anemia 10/01/2013  . CHF (congestive heart failure) (Mark [name])   . Chronic kidney disease   . CKD (chronic kidney disease) stage 3, GFR 30-59 ml/min (HCC) 08/18/2015  . Diabetes mellitus, type 2 ([place]) 04/16/2015  . Diverticulitis   . DM (diabetes mellitus), type 2 with peripheral vascular complications (Mark [name])   . Elevated troponin 10/16/2014  . Essential hypertension 04/08/2014  . History of Clostridium difficile colitis 01/01/2016  . Hypertension    no pcp  . Hypothermia 01/01/2016  . Malnutrition of moderate degree (Mark [name]) 04/17/2015  . Onychomycosis of toenail 04/30/2015  . Phantom limb pain (Mark [name]) 12/12/2013  . Positive for microalbuminuria 08/18/2015  . S/P BKA (below knee amputation) (Mark [name]) 11/21/2013   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    . Seizures (Mark [name])   . Spleen absent   . Substance abuse (Mark [name]) 04/02/2016   Cocaine  . Wound infection 01/02/2016     FAMILY HISTORY:   Family History  Problem Relation Age of Onset  . Diabetes Mother     SOCIAL HISTORY:    Social History   Tobacco Use  . Smoking status: Former Smoker    Packs/day: 1.00    Years: 10.00    Pack years: 10.00    Quit date: 08/03/2013    Years since quitting: 6.9  . Smokeless tobacco: Never Used  Substance Use Topics  . Alcohol use: No     ALLERGIES:   No Known Allergies   CURRENT MEDICATIONS:   Current Outpatient Medications  Medication Sig Dispense Refill  . amLODipine (NORVASC) 10 MG tablet Take 1 tablet (10 mg total) by mouth daily. 30 tablet 6  . aspirin EC 81 MG tablet Take 1 tablet (81 mg total) by mouth daily. 30 tablet 5  . atorvastatin (LIPITOR) 20 MG tablet TAKE 1 TABLET BY MOUTH  DAILY 90 tablet 0  . bisacodyl (DULCOLAX) 5 MG EC tablet Take 5 mg by mouth daily as needed for moderate constipation.    . Blood Glucose Monitoring Suppl (ONETOUCH VERIO) w/Device KIT Use as directed to test blood sugar three times daily. 1 kit 0  . calcitRIOL (ROCALTROL) 0.25 MCG capsule Take 0.25 mcg by mouth daily.    . carvedilol (COREG) 25 MG tablet Take 1 tablet (25 mg total) by mouth 2 (two) times daily with a meal. 60 tablet 6  . cholecalciferol (VITAMIN D3) 25 MCG (1000 UNIT) tablet Take 1,000 Units by mouth daily.    . ferrous gluconate (FERGON) 324 MG tablet Take 1 tablet (324 mg total) by mouth 2 (  two) times daily with a meal. 60 tablet 5  . furosemide (LASIX) 80 MG tablet Take 80 mg by mouth daily.    Marland Kitchen gabapentin (NEURONTIN) 100 MG capsule Take 1 capsule (100 mg total) by mouth at bedtime. 30 capsule 3  . glucose blood (ONETOUCH VERIO) test strip Use as directed to test blood sugar three times daily. 100 each 12  . hydrALAZINE (APRESOLINE) 25 MG tablet Take 1 tablet (25 mg total) by mouth 2 (two) times daily. 180 tablet 3  . Insulin Glargine (LANTUS SOLOSTAR) 100 UNIT/ML Solostar Pen Inject 10 Units into the skin at bedtime. (Patient taking differently: Inject 10 Units into the skin at bedtime as needed (>200). ) 15 mL 0  . Insulin Pen Needle (TRUEPLUS PEN  NEEDLES) 32G X 4 MM MISC Use as directed to administer lantus daily 100 each 1  . Insulin Syringe-Needle U-100 (INSULIN SYRINGE .5CC/30GX5/16") 30G X 5/16" 0.5 ML MISC Check blood sugar TID & QHS 100 each 2  . Multiple Vitamin (MULTIVITAMIN WITH MINERALS) TABS tablet Take 1 tablet by mouth daily.    [name] Delica Lancets 51T MISC Use as directed to test blood sugar three times daily. 100 each 12  . OVER THE COUNTER MEDICATION Take 1 tablet by mouth daily. Kidney Factors    . sodium bicarbonate 650 MG tablet Take 1,300 mg by mouth 2 (two) times daily.     No current facility-administered medications for this visit.   Facility-Administered Medications Ordered in Other Visits  Medication Dose Route Frequency Provider Last Rate Last Admin  . vancomycin (VANCOREADY) IVPB 1500 mg/300 mL  1,500 mg Intravenous Once Angelia Mould, MD        REVIEW OF SYSTEMS:   [X]  denotes positive finding, [ ]  denotes negative finding Cardiac  Comments:  Chest pain or chest pressure:    Shortness of breath upon exertion:    Short of breath when lying flat:    Irregular heart rhythm:        Vascular    Pain in calf, thigh, or hip brought on by ambulation:    Pain in feet at night that wakes you up from your sleep:     Blood clot in your veins:    Leg swelling:         Pulmonary    Oxygen at home:    Productive cough:     Wheezing:         Neurologic    Sudden weakness in arms or legs:     Sudden numbness in arms or legs:     Sudden onset of difficulty speaking or slurred speech:    Temporary loss of vision in one eye:     Problems with dizziness:         Gastrointestinal    Blood in stool:     Vomited blood:         Genitourinary    Burning when urinating:     Blood in urine:        Psychiatric    Major depression:         Hematologic    Bleeding problems:    Problems with blood clotting too easily:        Skin    Rashes or ulcers:        Constitutional    Fever or  chills:      PHYSICAL EXAM:   There were no vitals filed for this visit.  Mark: The patient is  a well-nourished male, in no acute distress. The vital signs are documented above. CARDIAC: There is a regular rate and rhythm.  VASCULAR: Palpable left radial and brachial pulse PULMONARY: Non-labored respirations  MUSCULOSKELETAL: There are no major deformities or cyanosis. NEUROLOGIC: No focal weakness or paresthesias are detected. SKIN: There are no ulcers or rashes noted. PSYCHIATRIC: The patient has a normal affect.  STUDIES:   I have reviewed the following: Arterial: Triphasic waveforms  +-----------------+-------------+----------+--------------------+  Right Cephalic  Diameter (cm)Depth (cm)   Findings     +-----------------+-------------+----------+--------------------+  Shoulder       0.37                    +-----------------+-------------+----------+--------------------+  Prox upper arm    0.37                    +-----------------+-------------+----------+--------------------+  Mid upper arm    0.23                    +-----------------+-------------+----------+--------------------+  Dist upper arm   0.18 / 0.10      thick walls , branch  +-----------------+-------------+----------+--------------------+  Antecubital fossa  0.22                    +-----------------+-------------+----------+--------------------+  Prox forearm    0.24 / 0.21                  +-----------------+-------------+----------+--------------------+  Mid forearm    0.36 / 0.23                  +-----------------+-------------+----------+--------------------+  Dist forearm     0.25                    +-----------------+-------------+----------+--------------------+    +-----------------+-------------+----------+-------------------------+  Right Basilic  Diameter (cm)Depth (cm)    Findings       +-----------------+-------------+----------+-------------------------+  Shoulder                    not visualized     +-----------------+-------------+----------+-------------------------+  Prox upper arm    0.27                       +-----------------+-------------+----------+-------------------------+  Mid upper arm   0.34 / 0.29      branching and branch 0.32  +-----------------+-------------+----------+-------------------------+  Dist upper arm    0.34                       +-----------------+-------------+----------+-------------------------+  Antecubital fossa  0.32                       +-----------------+-------------+----------+-------------------------+  Prox forearm     0.30                       +-----------------+-------------+----------+-------------------------+   +-----------------+-------------+----------+---------------------------+  Left Cephalic  Diameter (cm)Depth (cm)     Findings       +-----------------+-------------+----------+---------------------------+  Shoulder       0.34                        +-----------------+-------------+----------+---------------------------+  Prox upper arm   0.32 / 0.26                      +-----------------+-------------+----------+---------------------------+  Mid upper arm   0.23 / 0.14                      +-----------------+-------------+----------+---------------------------+  Dist upper arm    0.25                        +-----------------+-------------+----------+---------------------------+  Antecubital fossa   0.15                        +-----------------+-------------+----------+---------------------------+  Prox forearm     0.20                        +-----------------+-------------+----------+---------------------------+  Mid forearm     0.22        branching and br mid to dst  +-----------------+-------------+----------+---------------------------+  Dist forearm     0.19                        +-----------------+-------------+----------+---------------------------+   +-----------------+-------------+----------+--------------+  Left Basilic   Diameter (cm)Depth (cm)  Findings    +-----------------+-------------+----------+--------------+  Shoulder                 not visualized  +-----------------+-------------+----------+--------------+  Prox upper arm              not visualized  +-----------------+-------------+----------+--------------+  Mid upper arm    0.33                 +-----------------+-------------+----------+--------------+  Dist upper arm    0.28        br mid to dst   +-----------------+-------------+----------+--------------+  Antecubital fossa  0.32                 +-----------------+-------------+----------+--------------+  Prox forearm     0.22                 +-----------------+-------------+----------+--------------+ MEDICAL ISSUES:   CKD 5: I discussed with the patient that the ultimate procedure will be determined in the operating room.  We discussed a left cephalic vein fistula versus a basilic vein fistula.  He understands that there is a likelihood of a second operation if the basilic vein is utilized.  I also discussed that if his veins do not appear adequate, a graft would be placed.  I discussed the risk and benefits of the procedure  including the risk of not maturity, the risk of steal syndrome, and the need for additional procedures.  All of his questions were answered.  He will be scheduled in the near future.   Leia Alf, MD, FACS [facility] [phone] Pager [phone]

## 2020-07-12 ENCOUNTER — Other Ambulatory Visit (HOSPITAL_COMMUNITY)
Admission: RE | Admit: 2020-07-12 | Discharge: 2020-07-12 | Disposition: A | Discharge status: Home / Self Care | Payer: Medicare Other | Source: Ambulatory Visit | Attending: Surgery | Admitting: Surgery

## 2020-07-12 DIAGNOSIS — Z01812 Encounter for preprocedural laboratory examination: Secondary | ICD-10-CM | POA: Insufficient documentation

## 2020-07-12 DIAGNOSIS — Z20822 Contact with and (suspected) exposure to covid-19: Secondary | ICD-10-CM | POA: Insufficient documentation

## 2020-07-12 LAB — SARS CORONAVIRUS 2 (TAT 6-24 HRS): SARS Coronavirus 2: NEGATIVE

## 2020-07-15 ENCOUNTER — Encounter (HOSPITAL_COMMUNITY): Payer: Self-pay | Admitting: Surgery

## 2020-07-15 ENCOUNTER — Other Ambulatory Visit: Payer: Self-pay

## 2020-07-15 DIAGNOSIS — Z01812 Encounter for preprocedural laboratory examination: Secondary | ICD-10-CM | POA: Diagnosis not present

## 2020-07-15 NOTE — Progress Notes (Signed)
[name] denies chest pain or shortness of breath. Patient tested negative for Covid 07/12/20 and has been in quarantine since that time.  [name] has type II diabetes, patient reports that CBGs run 125-170. I instructed patient that if CBG is > 200 at hs to take 1/2 of Lantus dose- take 5 uints. I instructed patient to check CBG after awaking and every 2 hours until arrival  to the hospital.  I Instructed patient if CBG is less than 70 to drink  or 1/2 cup of a clear juice. Recheck CBG in 15 minutes then call pre- op desk at [phone] for further instructions.  [name] has arranged for transportation.  [name] reports that he will be picked up in time to arrive by 0900, the time he was instructed to arrive by office staff. I told [name] that maybe the time was changed , but we need him to arrive at 38.  Patient said that he will try to get in touch with [employer] and see if he can be picked up early.

## 2020-07-16 ENCOUNTER — Encounter (HOSPITAL_COMMUNITY)
Admission: RE | Disposition: A | Discharge status: Home / Self Care | Payer: Self-pay | Source: Home / Self Care | Attending: Surgery

## 2020-07-16 ENCOUNTER — Ambulatory Visit (HOSPITAL_COMMUNITY): Payer: Medicare Other | Admitting: Anesthesiology

## 2020-07-16 ENCOUNTER — Other Ambulatory Visit: Payer: Self-pay

## 2020-07-16 ENCOUNTER — Encounter (HOSPITAL_COMMUNITY): Payer: Self-pay | Admitting: Surgery

## 2020-07-16 ENCOUNTER — Ambulatory Visit (HOSPITAL_COMMUNITY)
Admission: RE | Admit: 2020-07-16 | Discharge: 2020-07-16 | Disposition: A | Discharge status: Home / Self Care | Payer: Medicare Other | Attending: Surgery | Admitting: Surgery

## 2020-07-16 DIAGNOSIS — Z7982 Long term (current) use of aspirin: Secondary | ICD-10-CM | POA: Diagnosis not present

## 2020-07-16 DIAGNOSIS — I5032 Chronic diastolic (congestive) heart failure: Secondary | ICD-10-CM | POA: Insufficient documentation

## 2020-07-16 DIAGNOSIS — I132 Hypertensive heart and chronic kidney disease with heart failure and with stage 5 chronic kidney disease, or end stage renal disease: Secondary | ICD-10-CM | POA: Insufficient documentation

## 2020-07-16 DIAGNOSIS — Z89512 Acquired absence of left leg below knee: Secondary | ICD-10-CM | POA: Insufficient documentation

## 2020-07-16 DIAGNOSIS — E1122 Type 2 diabetes mellitus with diabetic chronic kidney disease: Secondary | ICD-10-CM | POA: Insufficient documentation

## 2020-07-16 DIAGNOSIS — N185 Chronic kidney disease, stage 5: Secondary | ICD-10-CM | POA: Insufficient documentation

## 2020-07-16 DIAGNOSIS — Z79899 Other long term (current) drug therapy: Secondary | ICD-10-CM | POA: Diagnosis not present

## 2020-07-16 HISTORY — DX: Diarrhea, unspecified: R19.7

## 2020-07-16 HISTORY — DX: Personal history of urinary calculi: Z87.442

## 2020-07-16 HISTORY — DX: Bursopathy, unspecified: M71.9

## 2020-07-16 HISTORY — PX: AV FISTULA PLACEMENT: SHX1204

## 2020-07-16 HISTORY — DX: Unspecified osteoarthritis, unspecified site: M19.90

## 2020-07-16 LAB — SURGICAL PCR SCREEN
MRSA, PCR: NEGATIVE
Staphylococcus aureus: NEGATIVE

## 2020-07-16 LAB — GLUCOSE, CAPILLARY: Glucose-Capillary: 129 mg/dL — ABNORMAL HIGH (ref 70–99)

## 2020-07-16 LAB — BASIC METABOLIC PANEL
Anion gap: 10 (ref 5–15)
BUN: 69 mg/dL — ABNORMAL HIGH (ref 8–23)
CO2: 18 mmol/L — ABNORMAL LOW (ref 22–32)
Calcium: 9.5 mg/dL (ref 8.9–10.3)
Chloride: 114 mmol/L — ABNORMAL HIGH (ref 98–111)
Creatinine, Ser: 7.26 mg/dL — ABNORMAL HIGH (ref 0.61–1.24)
GFR, Estimated: 8 mL/min — ABNORMAL LOW (ref >60)
Glucose, Bld: 144 mg/dL — ABNORMAL HIGH (ref 70–99)
Potassium: 4.7 mmol/L (ref 3.5–5.1)
Sodium: 142 mmol/L (ref 135–145)

## 2020-07-16 SURGERY — ARTERIOVENOUS (AV) FISTULA CREATION
Anesthesia: General | Laterality: Left

## 2020-07-16 MED ORDER — FENTANYL CITRATE (PF) 250 MCG/5ML IJ SOLN
INTRAMUSCULAR | Status: AC
  Filled 2020-07-16: qty 5

## 2020-07-16 MED ORDER — CHLORHEXIDINE GLUCONATE 4 % EX LIQD: 60.0000 mL | Freq: Once | CUTANEOUS | Status: DC

## 2020-07-16 MED ORDER — EPHEDRINE SULFATE 50 MG/ML IJ SOLN
INTRAMUSCULAR | Status: DC | PRN
  Administered 2020-07-16: 10 mg via INTRAVENOUS

## 2020-07-16 MED ORDER — HYDROCODONE-ACETAMINOPHEN 5-325 MG PO TABS: 1.0000 | ORAL_TABLET | Freq: Four times a day (QID) | ORAL | 0 refills | Status: DC | PRN

## 2020-07-16 MED ORDER — LIDOCAINE HCL (CARDIAC) PF 100 MG/5ML IV SOSY
PREFILLED_SYRINGE | INTRAVENOUS | Status: DC | PRN
  Administered 2020-07-16: 100 mg via INTRAVENOUS

## 2020-07-16 MED ORDER — ORAL CARE MOUTH RINSE: 15.0000 mL | Freq: Once | OROMUCOSAL | Status: AC

## 2020-07-16 MED ORDER — LIDOCAINE-EPINEPHRINE (PF) 1 %-1:200000 IJ SOLN
INTRAMUSCULAR | Status: AC
  Filled 2020-07-16: qty 30

## 2020-07-16 MED ORDER — CHLORHEXIDINE GLUCONATE 0.12 % MT SOLN
15.0000 mL | Freq: Once | OROMUCOSAL | Status: AC
  Administered 2020-07-16: 15 mL via OROMUCOSAL
  Filled 2020-07-16: qty 15

## 2020-07-16 MED ORDER — ONDANSETRON HCL 4 MG/2ML IJ SOLN
INTRAMUSCULAR | Status: DC | PRN
  Administered 2020-07-16: 4 mg via INTRAVENOUS

## 2020-07-16 MED ORDER — PHENYLEPHRINE HCL-NACL 10-0.9 MG/250ML-% IV SOLN: INTRAVENOUS | Status: DC | PRN

## 2020-07-16 MED ORDER — SODIUM CHLORIDE 0.9 % IV SOLN: INTRAVENOUS | Status: DC | PRN

## 2020-07-16 MED ORDER — PHENYLEPHRINE HCL-NACL 10-0.9 MG/250ML-% IV SOLN
INTRAVENOUS | Status: DC | PRN
  Administered 2020-07-16: 50 ug/min via INTRAVENOUS

## 2020-07-16 MED ORDER — FENTANYL CITRATE (PF) 250 MCG/5ML IJ SOLN
INTRAMUSCULAR | Status: DC | PRN
Start: 2020-07-16 — End: 2020-07-16
  Administered 2020-07-16: 50 ug via INTRAVENOUS
  Administered 2020-07-16 (×2): 25 ug via INTRAVENOUS

## 2020-07-16 MED ORDER — CEFAZOLIN SODIUM-DEXTROSE 2-4 GM/100ML-% IV SOLN
2.0000 g | INTRAVENOUS | Status: AC
  Administered 2020-07-16: 2 g via INTRAVENOUS
  Filled 2020-07-16: qty 100

## 2020-07-16 MED ORDER — CARVEDILOL 25 MG PO TABS: 25.0000 mg | ORAL_TABLET | Freq: Once | ORAL | Status: AC

## 2020-07-16 MED ORDER — PHENYLEPHRINE HCL (PRESSORS) 10 MG/ML IV SOLN
INTRAVENOUS | Status: DC | PRN
  Administered 2020-07-16 (×2): 40 ug via INTRAVENOUS

## 2020-07-16 MED ORDER — 0.9 % SODIUM CHLORIDE (POUR BTL) OPTIME
TOPICAL | Status: DC | PRN
  Administered 2020-07-16: 1000 mL

## 2020-07-16 MED ORDER — LIDOCAINE-EPINEPHRINE (PF) 1 %-1:200000 IJ SOLN
INTRAMUSCULAR | Status: DC | PRN
  Administered 2020-07-16: 30 mL

## 2020-07-16 MED ORDER — PROPOFOL 10 MG/ML IV BOLUS
INTRAVENOUS | Status: DC | PRN
  Administered 2020-07-16: 160 mg via INTRAVENOUS
  Administered 2020-07-16: 40 mg via INTRAVENOUS

## 2020-07-16 MED ORDER — CARVEDILOL 12.5 MG PO TABS
ORAL_TABLET | ORAL | Status: AC
  Administered 2020-07-16: 25 mg via ORAL
  Filled 2020-07-16: qty 2

## 2020-07-16 MED ORDER — SODIUM CHLORIDE 0.9 % IV SOLN
INTRAVENOUS | Status: AC
  Filled 2020-07-16: qty 1.2

## 2020-07-16 MED ORDER — SODIUM CHLORIDE 0.9 % IV SOLN: INTRAVENOUS | Status: DC

## 2020-07-16 MED ORDER — ALBUMIN HUMAN 5 % IV SOLN: INTRAVENOUS | Status: DC | PRN

## 2020-07-16 MED ORDER — PHENYLEPHRINE HCL (PRESSORS) 10 MG/ML IV SOLN
INTRAVENOUS | Status: DC | PRN
  Administered 2020-07-16: 100 ug via INTRAVENOUS

## 2020-07-16 MED ORDER — SODIUM CHLORIDE 0.9 % IV SOLN
INTRAVENOUS | Status: DC | PRN
  Administered 2020-07-16: 500 mL

## 2020-07-16 MED ORDER — LIDOCAINE HCL (PF) 1 % IJ SOLN
INTRAMUSCULAR | Status: AC
  Filled 2020-07-16: qty 30

## 2020-07-16 SURGICAL SUPPLY — 39 items
ADH SKN CLS APL DERMABOND .7 (GAUZE/BANDAGES/DRESSINGS) ×1
ARMBAND PINK RESTRICT EXTREMIT (MISCELLANEOUS) ×3 IMPLANT
CANISTER SUCT 3000ML PPV (MISCELLANEOUS) ×2 IMPLANT
CLIP VESOCCLUDE MED 6/CT (CLIP) ×2 IMPLANT
CLIP VESOCCLUDE SM WIDE 6/CT (CLIP) ×2 IMPLANT
COVER PROBE W GEL 5X96 (DRAPES) ×3 IMPLANT
COVER WAND RF STERILE (DRAPES) ×1 IMPLANT
DECANTER SPIKE VIAL GLASS SM (MISCELLANEOUS) ×1 IMPLANT
DERMABOND ADVANCED (GAUZE/BANDAGES/DRESSINGS) ×1
DERMABOND ADVANCED .7 DNX12 (GAUZE/BANDAGES/DRESSINGS) ×1 IMPLANT
ELECT REM PT RETURN 9FT ADLT (ELECTROSURGICAL) ×2
ELECTRODE REM PT RTRN 9FT ADLT (ELECTROSURGICAL) ×1 IMPLANT
GLOVE BIOGEL PI IND STRL 6.5 (GLOVE) IMPLANT
GLOVE BIOGEL PI IND STRL 7.5 (GLOVE) ×1 IMPLANT
GLOVE BIOGEL PI IND STRL 8 (GLOVE) IMPLANT
GLOVE BIOGEL PI INDICATOR 6.5 (GLOVE) ×1
GLOVE BIOGEL PI INDICATOR 7.5 (GLOVE) ×1
GLOVE BIOGEL PI INDICATOR 8 (GLOVE) ×1
GLOVE ECLIPSE 6.5 STRL STRAW (GLOVE) ×1 IMPLANT
GLOVE SURG SS PI 7.5 STRL IVOR (GLOVE) ×2 IMPLANT
GOWN STRL REUS W/ TWL LRG LVL3 (GOWN DISPOSABLE) ×2 IMPLANT
GOWN STRL REUS W/ TWL XL LVL3 (GOWN DISPOSABLE) ×1 IMPLANT
GOWN STRL REUS W/TWL LRG LVL3 (GOWN DISPOSABLE) ×4
GOWN STRL REUS W/TWL XL LVL3 (GOWN DISPOSABLE) ×2
HEMOSTAT SNOW SURGICEL 2X4 (HEMOSTASIS) IMPLANT
KIT BASIN OR (CUSTOM PROCEDURE TRAY) ×2 IMPLANT
KIT TURNOVER KIT B (KITS) ×2 IMPLANT
NS IRRIG 1000ML POUR BTL (IV SOLUTION) ×2 IMPLANT
PACK CV ACCESS (CUSTOM PROCEDURE TRAY) ×2 IMPLANT
PAD ARMBOARD 7.5X6 YLW CONV (MISCELLANEOUS) ×4 IMPLANT
SUT PROLENE 6 0 CC (SUTURE) ×2 IMPLANT
SUT SILK 3 0 (SUTURE) ×2
SUT SILK 3-0 18XBRD TIE 12 (SUTURE) IMPLANT
SUT VIC AB 3-0 SH 27 (SUTURE) ×2
SUT VIC AB 3-0 SH 27X BRD (SUTURE) ×1 IMPLANT
SUT VICRYL 4-0 PS2 18IN ABS (SUTURE) ×2 IMPLANT
TOWEL GREEN STERILE (TOWEL DISPOSABLE) ×2 IMPLANT
UNDERPAD 30X36 HEAVY ABSORB (UNDERPADS AND DIAPERS) ×2 IMPLANT
WATER STERILE IRR 1000ML POUR (IV SOLUTION) ×2 IMPLANT

## 2020-07-16 NOTE — Transfer of Care (Signed)
Immediate Anesthesia Transfer of Care Note  Patient: Mark [name]  Procedure(s) Performed: LEFT ARM ARTERIOVENOUS (AV) FISTULA (Left )  Patient Location: PACU  Anesthesia Type:General  Level of Consciousness: awake and alert   Airway & Oxygen Therapy: Patient Spontanous Breathing  Post-op Assessment: Report given to RN and Post -op Vital signs reviewed and stable  Post vital signs: Reviewed and stable  Last Vitals:  Vitals Value Taken Time  BP 168/90 07/16/20 1232  Temp    Pulse 68 07/16/20 1237  Resp 15 07/16/20 1237  SpO2 100 % 07/16/20 1237  Vitals shown include unvalidated device data.  Last Pain:  Vitals:   07/16/20 0916  TempSrc:   PainSc: 0-No pain         Complications: No complications documented.

## 2020-07-16 NOTE — Interval H&P Note (Signed)
History and Physical Interval Note:  07/16/2020 10:24 AM  Mark [name]  has presented today for surgery, with the diagnosis of CHRONIC RENAL INSUFFICIENCY STAGE V.  The various methods of treatment have been discussed with the patient and family. After consideration of risks, benefits and other options for treatment, the patient has consented to  Procedure(s): LEFT ARM ARTERIOVENOUS (AV) FISTULA VERSUS ARTERIOVENOUS GORE-TEX GRAFT CREATION (Left) as a surgical intervention.  The patient's history has been reviewed, patient examined, no change in status, stable for surgery.  I have reviewed the patient's chart and labs.  Questions were answered to the patient's satisfaction.     [name]

## 2020-07-16 NOTE — Anesthesia Procedure Notes (Signed)
Procedure Name: LMA Insertion Date/Time: 07/16/2020 11:01 AM Performed by: Eligha Bridegroom, CRNA Pre-anesthesia Checklist: Patient identified, Emergency Drugs available, Suction available, Patient being monitored and Timeout performed Patient Re-evaluated:Patient Re-evaluated prior to induction Oxygen Delivery Method: Circle system utilized Preoxygenation: Pre-oxygenation with 100% oxygen Induction Type: IV induction LMA: LMA flexible inserted LMA Size: 4.0 Number of attempts: 1 Placement Confirmation: positive ETCO2 and breath sounds checked- equal and bilateral Tube secured with: Tape Dental Injury: Teeth and Oropharynx as per pre-operative assessment

## 2020-07-16 NOTE — Progress Notes (Signed)
CRITICAL VALUE ALERT  Critical Value:  Potassium 6.7 from Istat  Date & Time Notied:  07/16/20 9:26 am  Provider Notified: Dr. Lissa Hoard  Orders Received/Actions taken: Orders received to redraw a Stat BMET

## 2020-07-16 NOTE — Op Note (Signed)
    Patient name: Mark [name] MRN: [id number] DOB: Nov 02, 1950 Sex: male  07/16/2020 Pre-operative Diagnosis: CKD 5 Post-operative diagnosis:  Same Surgeon:  [name] Assistants: Arlee Muslim Procedure:   Left first stage basilic vein fistula creation Anesthesia: General Blood Loss: Minimal Specimens: None  Findings: Approximate 3 mm basilic vein.  He had a large brachial vein coursing anterior to his brachial artery.  The brachial artery was dissected out underneath the vein and the anastomosis is on the medial side of the artery  Indications:   The patient comes in for dialysis access  Procedure:  The patient was identified in the holding area and taken to [place] [facility]  The patient was then placed supine on the table. general anesthesia was administered.  The patient was prepped and draped in the usual sterile fashion.  A time out was called and antibiotics were administered.  A PA was necessary to expedite the procedure and perform necessary technical details  Ultrasound was used to evaluate the cephalic and basilic vein in the left arm.  The cephalic vein was too small.  The basilic vein was of excellent caliber in the upper arm.  I did branch just above the antecubital crease however the branches did appear to be acceptable for fistula creation.  Next, an oblique incision was made just proximal to the antecubital crease.  Cautery was used about subcutaneous tissue.  There was a large, 5-6 mm brachial vein directly anterior to the brachial artery.  The vein was fully mobilized and the brachial artery was dissected out posterior to the vein.  The artery was 4 mm and disease-free.  Next, I identified the basilic vein.  There were multiple branches that were divided between silk ties.  Ultimately I dissected out the main basilic vein which was roughly 3 mm.  I did dissect out up to the primary branch.  Above the primary branch it was 4 to 5 mm.  The vein was marked for orientation and  then ligated distally.  It distended nicely with heparin saline.  Next the brachial artery was occluded with baby Gregory clamps.  A #11 blade was used to make an arteriotomy which was extended longitudinally with Potts scissors.  The vein was cut the appropriate length and spatulated to fit the size of the arteriotomy.  A running anastomosis was created with 6-0 Prolene.  Prior to completion the appropriate flushing maneuvers were performed and the anastomosis was completed.  The clamps were released and the vein distended nicely.  I explored the vein to make sure there were no kinks.  There was a palpable thrill within the fistula and a palpable radial pulse.  The wound was irrigated.  Hemostasis was achieved.  The incision was closed with 2 layers of 3-0 Vicryl followed by Dermabond.  There were no immediate complications.   Disposition: To PACU stable.   Theotis Burrow, M.D., [facility] [place] Office: [phone] Pager:  [phone]

## 2020-07-16 NOTE — Discharge Instructions (Signed)
° °  [facility] [place] ° °Discharge Instructions ° °AV Fistula or Graft Surgery for Dialysis Access ° °Please refer to the following instructions for your post-procedure care. Your surgeon or physician assistant will discuss any changes with you. ° °Activity ° °You may drive the day following your surgery, if you are comfortable and no longer taking prescription pain medication. Resume full activity as the soreness in your incision resolves. ° °Bathing/Showering ° °You may shower after you go home. Keep your incision dry for 48 hours. Do not soak in a bathtub, hot tub, or swim until the incision heals completely. You may not shower if you have a hemodialysis catheter. ° °Incision Care ° °Clean your incision with mild soap and water after 48 hours. Pat the area dry with a clean towel. You do not need a bandage unless otherwise instructed. Do not apply any ointments or creams to your incision. You may have skin glue on your incision. Do not peel it off. It will come off on its own in about one week. Your arm may swell a bit after surgery. To reduce swelling use pillows to elevate your arm so it is above your heart. Your doctor will tell you if you need to lightly wrap your arm with an ACE bandage. ° °Diet ° °Resume your normal diet. There are not special food restrictions following this procedure. In order to heal from your surgery, it is CRITICAL to get adequate nutrition. Your body requires vitamins, minerals, and protein. Vegetables are the best source of vitamins and minerals. Vegetables also provide the perfect balance of protein. Processed food has little nutritional value, so try to avoid this. ° °Medications ° °Resume taking all of your medications. If your incision is causing pain, you may take over-the counter pain relievers such as acetaminophen (Tylenol). If you were prescribed a stronger pain medication, please be aware these medications can cause nausea and constipation. Prevent  nausea by taking the medication with a snack or meal. Avoid constipation by drinking plenty of fluids and eating foods with high amount of fiber, such as fruits, vegetables, and grains. Do not take Tylenol if you are taking prescription pain medications. ° ° ° ° °Follow up °Your surgeon may want to see you in the office following your access surgery. If so, this will be arranged at the time of your surgery. ° °Please call us immediately for any of the following conditions: ° °Increased pain, redness, drainage (pus) from your incision site °Fever of 101 degrees or higher °Severe or worsening pain at your incision site °Hand pain or numbness. ° °Reduce your risk of vascular disease: ° °Stop smoking. If you would like help, call [facility] at 1-800-QUIT-NOW (1-800-784-8669) or [place] at 336-586-4000 ° °Manage your cholesterol °Maintain a desired weight °Control your diabetes °Keep your blood pressure down ° °Dialysis ° °It will take several weeks to several months for your new dialysis access to be ready for use. Your surgeon will determine when it is OK to use it. Your nephrologist will continue to direct your dialysis. You can continue to use your Permcath until your new access is ready for use. ° °If you have any questions, please call the office at [phone]. ° °

## 2020-07-16 NOTE — Anesthesia Preprocedure Evaluation (Addendum)
Anesthesia Evaluation  Patient identified by MRN, date of birth, ID band Patient awake    Reviewed: Allergy & Precautions, NPO status , Patient's Chart, lab work & pertinent test results, reviewed documented beta blocker date and time   Airway Mallampati: II  TM Distance: >3 FB Neck ROM: Full    Dental  (+) Edentulous Upper, Edentulous Lower   Pulmonary pneumonia, resolved, former smoker,    Pulmonary exam normal breath sounds clear to auscultation       Cardiovascular hypertension, Pt. on home beta blockers and Pt. on medications + Peripheral Vascular Disease and +CHF  Normal cardiovascular exam Rhythm:Regular Rate:Normal     Neuro/Psych Seizures -,   Neuromuscular disease    GI/Hepatic Neg liver ROS, GERD  ,  Endo/Other  negative endocrine ROSdiabetes  Renal/GU Renal disease     Musculoskeletal  (+) Arthritis ,   Abdominal (+) + obese,   Peds  Hematology  (+) Blood dyscrasia, anemia ,   Anesthesia Other Findings   Reproductive/Obstetrics                           Anesthesia Physical Anesthesia Plan  ASA: III  Anesthesia Plan: MAC   Post-op Pain Management:    Induction: Intravenous  PONV Risk Score and Plan: 1 and Propofol infusion and Treatment may vary due to age or medical condition  Airway Management Planned: Natural Airway  Additional Equipment: None  Intra-op Plan:   Post-operative Plan:   Informed Consent: I have reviewed the patients History and Physical, chart, labs and discussed the procedure including the risks, benefits and alternatives for the proposed anesthesia with the patient or authorized representative who has indicated his/her understanding and acceptance.     Dental advisory given  Plan Discussed with: CRNA  Anesthesia Plan Comments:        Anesthesia Quick Evaluation

## 2020-07-17 ENCOUNTER — Encounter (HOSPITAL_COMMUNITY): Payer: Self-pay | Admitting: Surgery

## 2020-07-17 LAB — POCT I-STAT, CHEM 8
BUN: 99 mg/dL — ABNORMAL HIGH (ref 8–23)
Calcium, Ion: 1.11 mmol/L — ABNORMAL LOW (ref 1.15–1.40)
Chloride: 118 mmol/L — ABNORMAL HIGH (ref 98–111)
Creatinine, Ser: 7.7 mg/dL — ABNORMAL HIGH (ref 0.61–1.24)
Glucose, Bld: 142 mg/dL — ABNORMAL HIGH (ref 70–99)
HCT: 33 % — ABNORMAL LOW (ref 39.0–52.0)
Hemoglobin: 11.2 g/dL — ABNORMAL LOW (ref 13.0–17.0)
Potassium: 6.7 mmol/L (ref 3.5–5.1)
Sodium: 142 mmol/L (ref 135–145)
TCO2: 19 mmol/L — ABNORMAL LOW (ref 22–32)

## 2020-07-18 ENCOUNTER — Encounter (HOSPITAL_COMMUNITY): Payer: Self-pay | Admitting: Surgery

## 2020-07-18 NOTE — Anesthesia Postprocedure Evaluation (Signed)
Anesthesia Post Note  Patient: Mark [name]  Procedure(s) Performed: LEFT ARM ARTERIOVENOUS (AV) FISTULA (Left )     Patient location during evaluation: PACU Anesthesia Type: General Level of consciousness: sedated and patient cooperative Pain management: pain level controlled Vital Signs Assessment: post-procedure vital signs reviewed and stable Respiratory status: spontaneous breathing Cardiovascular status: stable Anesthetic complications: no   No complications documented.  Last Vitals:  Vitals:   07/16/20 1247 07/16/20 1302  BP: (!) 171/85 (!) 171/85  Pulse: 61 (!) 59  Resp: 19 (!) 21  Temp:  (!) 36.1 C  SpO2: 98% 98%    Last Pain:  Vitals:   07/16/20 1232  TempSrc:   PainSc: 0-No pain                 Nolon Nations

## 2020-07-25 ENCOUNTER — Other Ambulatory Visit: Payer: Self-pay

## 2020-07-25 ENCOUNTER — Ambulatory Visit (HOSPITAL_COMMUNITY)
Admission: RE | Admit: 2020-07-25 | Discharge: 2020-07-25 | Disposition: A | Discharge status: Home / Self Care | Payer: Medicare Other | Source: Ambulatory Visit | Attending: Nephrology | Admitting: Nephrology

## 2020-07-25 VITALS — BP 154/66 | HR 67 | Temp 97.4°F | Resp 18

## 2020-07-25 DIAGNOSIS — N183 Chronic kidney disease, stage 3 unspecified: Secondary | ICD-10-CM

## 2020-07-25 LAB — POCT HEMOGLOBIN-HEMACUE: Hemoglobin: 9.5 g/dL — ABNORMAL LOW (ref 13.0–17.0)

## 2020-07-25 MED ORDER — EPOETIN ALFA-EPBX 10000 UNIT/ML IJ SOLN
20000.0000 [IU] | INTRAMUSCULAR | Status: DC
  Administered 2020-07-25: 20000 [IU] via SUBCUTANEOUS

## 2020-07-25 MED ORDER — EPOETIN ALFA-EPBX 10000 UNIT/ML IJ SOLN
INTRAMUSCULAR | Status: AC
  Filled 2020-07-25: qty 2

## 2020-08-14 ENCOUNTER — Other Ambulatory Visit: Payer: Self-pay

## 2020-08-14 DIAGNOSIS — N185 Chronic kidney disease, stage 5: Secondary | ICD-10-CM

## 2020-08-19 ENCOUNTER — Other Ambulatory Visit: Payer: Self-pay

## 2020-08-19 ENCOUNTER — Inpatient Hospital Stay: Payer: Medicare Other | Attending: Nurse Practitioner

## 2020-08-19 ENCOUNTER — Telehealth: Payer: Self-pay

## 2020-08-19 DIAGNOSIS — D472 Monoclonal gammopathy: Secondary | ICD-10-CM

## 2020-08-19 LAB — CMP (CANCER CENTER ONLY)
ALT: 7 U/L (ref 0–44)
AST: 7 U/L — ABNORMAL LOW (ref 15–41)
Albumin: 3.5 g/dL (ref 3.5–5.0)
Alkaline Phosphatase: 52 U/L (ref 38–126)
Anion gap: 10 (ref 5–15)
BUN: 91 mg/dL — ABNORMAL HIGH (ref 8–23)
CO2: 16 mmol/L — ABNORMAL LOW (ref 22–32)
Calcium: 9.7 mg/dL (ref 8.9–10.3)
Chloride: 117 mmol/L — ABNORMAL HIGH (ref 98–111)
Creatinine: 9.09 mg/dL (ref 0.61–1.24)
GFR, Estimated: 6 mL/min — ABNORMAL LOW (ref >60)
Glucose, Bld: 121 mg/dL — ABNORMAL HIGH (ref 70–99)
Potassium: 4.9 mmol/L (ref 3.5–5.1)
Sodium: 143 mmol/L (ref 135–145)
Total Bilirubin: 0.3 mg/dL (ref 0.3–1.2)
Total Protein: 7.7 g/dL (ref 6.5–8.1)

## 2020-08-19 LAB — CBC WITH DIFFERENTIAL (CANCER CENTER ONLY)
Abs Immature Granulocytes: 0.02 10*3/uL (ref 0.00–0.07)
Basophils Absolute: 0.1 10*3/uL (ref 0.0–0.1)
Basophils Relative: 1 %
Eosinophils Absolute: 0.4 10*3/uL (ref 0.0–0.5)
Eosinophils Relative: 6 %
HCT: 30.9 % — ABNORMAL LOW (ref 39.0–52.0)
Hemoglobin: 9.7 g/dL — ABNORMAL LOW (ref 13.0–17.0)
Immature Granulocytes: 0 %
Lymphocytes Relative: 32 %
Lymphs Abs: 2.6 10*3/uL (ref 0.7–4.0)
MCH: 30.9 pg (ref 26.0–34.0)
MCHC: 31.4 g/dL (ref 30.0–36.0)
MCV: 98.4 fL (ref 80.0–100.0)
Monocytes Absolute: 0.7 10*3/uL (ref 0.1–1.0)
Monocytes Relative: 8 %
Neutro Abs: 4.2 10*3/uL (ref 1.7–7.7)
Neutrophils Relative %: 53 %
Platelet Count: 271 10*3/uL (ref 150–400)
RBC: 3.14 MIL/uL — ABNORMAL LOW (ref 4.22–5.81)
RDW: 15.4 % (ref 11.5–15.5)
WBC Count: 7.9 10*3/uL (ref 4.0–10.5)
nRBC: 0 % (ref 0.0–0.2)

## 2020-08-19 NOTE — Telephone Encounter (Signed)
Critical Value: Creatinine: 9.09 Cira Rue, NP notified

## 2020-08-20 LAB — KAPPA/LAMBDA LIGHT CHAINS
Kappa free light chain: 206.3 mg/L — ABNORMAL HIGH (ref 3.3–19.4)
Kappa, lambda light chain ratio: 2.7 — ABNORMAL HIGH (ref 0.26–1.65)
Lambda free light chains: 76.3 mg/L — ABNORMAL HIGH (ref 5.7–26.3)

## 2020-08-21 ENCOUNTER — Ambulatory Visit (HOSPITAL_COMMUNITY)
Admission: RE | Admit: 2020-08-21 | Discharge: 2020-08-21 | Disposition: A | Discharge status: Home / Self Care | Payer: Medicare Other | Source: Ambulatory Visit | Attending: Nephrology | Admitting: Nephrology

## 2020-08-21 ENCOUNTER — Other Ambulatory Visit: Payer: Self-pay

## 2020-08-21 VITALS — BP 151/73 | HR 68 | Temp 97.6°F | Resp 18

## 2020-08-21 DIAGNOSIS — N183 Chronic kidney disease, stage 3 unspecified: Secondary | ICD-10-CM | POA: Insufficient documentation

## 2020-08-21 DIAGNOSIS — D631 Anemia in chronic kidney disease: Secondary | ICD-10-CM | POA: Insufficient documentation

## 2020-08-21 LAB — MULTIPLE MYELOMA PANEL, SERUM
Albumin SerPl Elph-Mcnc: 3.5 g/dL (ref 2.9–4.4)
Albumin/Glob SerPl: 1 (ref 0.7–1.7)
Alpha 1: 0.2 g/dL (ref 0.0–0.4)
Alpha2 Glob SerPl Elph-Mcnc: 0.7 g/dL (ref 0.4–1.0)
B-Globulin SerPl Elph-Mcnc: 0.9 g/dL (ref 0.7–1.3)
Gamma Glob SerPl Elph-Mcnc: 1.8 g/dL (ref 0.4–1.8)
Globulin, Total: 3.6 g/dL (ref 2.2–3.9)
IgA: 184 mg/dL (ref 61–437)
IgG (Immunoglobin G), Serum: 1656 mg/dL — ABNORMAL HIGH (ref 603–1613)
IgM (Immunoglobulin M), Srm: 355 mg/dL — ABNORMAL HIGH (ref 20–172)
M Protein SerPl Elph-Mcnc: 0.5 g/dL — ABNORMAL HIGH
Total Protein ELP: 7.1 g/dL (ref 6.0–8.5)

## 2020-08-21 LAB — COMPREHENSIVE METABOLIC PANEL
ALT: 8 U/L (ref 0–44)
AST: 8 U/L — ABNORMAL LOW (ref 15–41)
Albumin: 3.5 g/dL (ref 3.5–5.0)
Alkaline Phosphatase: 47 U/L (ref 38–126)
Anion gap: 11 (ref 5–15)
BUN: 84 mg/dL — ABNORMAL HIGH (ref 8–23)
CO2: 17 mmol/L — ABNORMAL LOW (ref 22–32)
Calcium: 9.8 mg/dL (ref 8.9–10.3)
Chloride: 114 mmol/L — ABNORMAL HIGH (ref 98–111)
Creatinine, Ser: 8.72 mg/dL — ABNORMAL HIGH (ref 0.61–1.24)
GFR, Estimated: 6 mL/min — ABNORMAL LOW (ref >60)
Glucose, Bld: 135 mg/dL — ABNORMAL HIGH (ref 70–99)
Potassium: 4.5 mmol/L (ref 3.5–5.1)
Sodium: 142 mmol/L (ref 135–145)
Total Bilirubin: 0.7 mg/dL (ref 0.3–1.2)
Total Protein: 7.5 g/dL (ref 6.5–8.1)

## 2020-08-21 LAB — PHOSPHORUS: Phosphorus: 4.9 mg/dL — ABNORMAL HIGH (ref 2.5–4.6)

## 2020-08-21 LAB — IRON AND TIBC
Iron: 55 ug/dL (ref 45–182)
Saturation Ratios: 27 % (ref 17.9–39.5)
TIBC: 203 ug/dL — ABNORMAL LOW (ref 250–450)
UIBC: 148 ug/dL

## 2020-08-21 LAB — POCT HEMOGLOBIN-HEMACUE: Hemoglobin: 9.6 g/dL — ABNORMAL LOW (ref 13.0–17.0)

## 2020-08-21 LAB — FERRITIN: Ferritin: 222 ng/mL (ref 24–336)

## 2020-08-21 MED ORDER — EPOETIN ALFA-EPBX 10000 UNIT/ML IJ SOLN
20000.0000 [IU] | INTRAMUSCULAR | Status: DC
  Administered 2020-08-21: 20000 [IU] via SUBCUTANEOUS

## 2020-08-21 MED ORDER — EPOETIN ALFA-EPBX 10000 UNIT/ML IJ SOLN
INTRAMUSCULAR | Status: AC
  Filled 2020-08-21: qty 2

## 2020-08-26 ENCOUNTER — Inpatient Hospital Stay: Payer: Medicare Other | Admitting: Nurse Practitioner
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
## 2020-08-27 ENCOUNTER — Inpatient Hospital Stay (HOSPITAL_COMMUNITY): Admission: RE | Admit: 2020-08-27 | Payer: Medicare Other | Source: Ambulatory Visit

## 2020-08-27 ENCOUNTER — Telehealth: Payer: Self-pay | Admitting: Adult Health

## 2020-08-27 NOTE — Telephone Encounter (Signed)
Left message to reschedule appointment per 12/28 schedule message.

## 2020-09-04 DIAGNOSIS — L602 Onychogryphosis: Secondary | ICD-10-CM | POA: Diagnosis not present

## 2020-09-04 DIAGNOSIS — I739 Peripheral vascular disease, unspecified: Secondary | ICD-10-CM | POA: Diagnosis not present

## 2020-09-04 DIAGNOSIS — L84 Corns and callosities: Secondary | ICD-10-CM | POA: Diagnosis not present

## 2020-09-08 DIAGNOSIS — N2581 Secondary hyperparathyroidism of renal origin: Secondary | ICD-10-CM | POA: Diagnosis not present

## 2020-09-08 DIAGNOSIS — I770 Arteriovenous fistula, acquired: Secondary | ICD-10-CM | POA: Diagnosis not present

## 2020-09-08 DIAGNOSIS — N189 Chronic kidney disease, unspecified: Secondary | ICD-10-CM | POA: Diagnosis not present

## 2020-09-08 DIAGNOSIS — E875 Hyperkalemia: Secondary | ICD-10-CM | POA: Diagnosis not present

## 2020-09-08 DIAGNOSIS — I5042 Chronic combined systolic (congestive) and diastolic (congestive) heart failure: Secondary | ICD-10-CM | POA: Diagnosis not present

## 2020-09-08 DIAGNOSIS — D631 Anemia in chronic kidney disease: Secondary | ICD-10-CM | POA: Diagnosis not present

## 2020-09-08 DIAGNOSIS — E1122 Type 2 diabetes mellitus with diabetic chronic kidney disease: Secondary | ICD-10-CM | POA: Diagnosis not present

## 2020-09-08 DIAGNOSIS — N185 Chronic kidney disease, stage 5: Secondary | ICD-10-CM | POA: Diagnosis not present

## 2020-09-08 DIAGNOSIS — I120 Hypertensive chronic kidney disease with stage 5 chronic kidney disease or end stage renal disease: Secondary | ICD-10-CM | POA: Diagnosis not present

## 2020-09-12 ENCOUNTER — Ambulatory Visit (INDEPENDENT_AMBULATORY_CARE_PROVIDER_SITE_OTHER): Payer: Self-pay | Admitting: Physician Assistant

## 2020-09-12 ENCOUNTER — Other Ambulatory Visit: Payer: Self-pay

## 2020-09-12 ENCOUNTER — Ambulatory Visit (HOSPITAL_COMMUNITY)
Admission: RE | Admit: 2020-09-12 | Discharge: 2020-09-12 | Disposition: A | Discharge status: Home / Self Care | Payer: Medicare Other | Source: Ambulatory Visit | Attending: Physician Assistant | Admitting: Physician Assistant

## 2020-09-12 VITALS — BP 153/84 | HR 74 | Temp 98.3°F | Resp 20 | Ht 74.0 in | Wt 256.6 lb

## 2020-09-12 DIAGNOSIS — N185 Chronic kidney disease, stage 5: Secondary | ICD-10-CM | POA: Insufficient documentation

## 2020-09-12 NOTE — Progress Notes (Signed)
Established Dialysis Access   History of Present Illness   Mark [name] is a 70 y.o. (09/14/1950) male who presents for re-evaluation of permanent access.  He is status post left first stage basilic vein fistula creation by Dr. Trula Slade on 07/16/2020.  He states the incision is well-healed.  He denies signs or symptoms of steal syndrome in his left hand.  He is not yet on HD.  CKD is managed by Dr. Marval Regal.  He is willing to proceed with second stage basilic vein transposition.  The patient's PMH, PSH, SH, and FamHx were reviewed and are unchanged from prior visit.  Current Outpatient Medications  Medication Sig Dispense Refill  . acetaminophen (TYLENOL) 500 MG tablet Take 500 mg by mouth every 6 (six) hours as needed for moderate pain or headache.    Marland Kitchen amLODipine (NORVASC) 10 MG tablet Take 1 tablet (10 mg total) by mouth daily. 30 tablet 6  . aspirin EC 81 MG tablet Take 1 tablet (81 mg total) by mouth daily. 30 tablet 5  . atorvastatin (LIPITOR) 20 MG tablet TAKE 1 TABLET BY MOUTH  DAILY 90 tablet 0  . Blood Glucose Monitoring Suppl (ONETOUCH VERIO) w/Device KIT Use as directed to test blood sugar three times daily. 1 kit 0  . calcitRIOL (ROCALTROL) 0.5 MCG capsule Take 0.5 mcg by mouth daily.    . carvedilol (COREG) 25 MG tablet Take 1 tablet (25 mg total) by mouth 2 (two) times daily with a meal. 60 tablet 6  . cholecalciferol (VITAMIN D3) 25 MCG (1000 UNIT) tablet Take 1,000 Units by mouth daily.    Marland Kitchen docusate sodium (COLACE) 100 MG capsule Take 100 mg by mouth daily as needed for mild constipation.    . ferrous sulfate 325 (65 FE) MG tablet Take 325 mg by mouth 2 (two) times daily.    . furosemide (LASIX) 80 MG tablet Take 80 mg by mouth daily.    Marland Kitchen gabapentin (NEURONTIN) 100 MG capsule Take 1 capsule (100 mg total) by mouth at bedtime. 30 capsule 3  . glucose blood (ONETOUCH VERIO) test strip Use as directed to test blood sugar three times daily. 100 each 12  . hydrALAZINE  (APRESOLINE) 25 MG tablet Take 1 tablet (25 mg total) by mouth 2 (two) times daily. 180 tablet 3  . Insulin Glargine (LANTUS SOLOSTAR) 100 UNIT/ML Solostar Pen Inject 10 Units into the skin at bedtime. (Patient taking differently: Inject 10 Units into the skin at bedtime as needed (bs >200).) 15 mL 0  . Insulin Pen Needle (TRUEPLUS PEN NEEDLES) 32G X 4 MM MISC Use as directed to administer lantus daily 100 each 1  . Insulin Syringe-Needle U-100 (INSULIN SYRINGE .5CC/30GX5/16") 30G X 5/16" 0.5 ML MISC Check blood sugar TID & QHS 100 each 2  . Multiple Vitamin (MULTIVITAMIN WITH MINERALS) TABS tablet Take 1 tablet by mouth daily.    [name] Delica Lancets 37J MISC Use as directed to test blood sugar three times daily. 100 each 12  . OVER THE COUNTER MEDICATION Take 1 tablet by mouth daily. Kidney Factors    . sodium bicarbonate 650 MG tablet Take 1,950 mg by mouth 2 (two) times daily.     Marland Kitchen HYDROcodone-acetaminophen (NORCO) 5-325 MG tablet Take 1 tablet by mouth every 6 (six) hours as needed for moderate pain. (Patient not taking: Reported on 09/12/2020) 10 tablet 0   No current facility-administered medications for this visit.   Facility-Administered Medications Ordered in Other Visits  Medication Dose Route Frequency Provider Last Rate Last Admin  . vancomycin (VANCOREADY) IVPB 1500 mg/300 mL  1,500 mg Intravenous Once Angelia Mould, MD        REVIEW OF SYSTEMS (negative unless checked):   Cardiac:  _0  Chest pain or chest pressure? _1  Shortness of breath upon activity? _2  Shortness of breath when lying flat? _3  Irregular heart rhythm?  Vascular:  _4  Pain in calf, thigh, or hip brought on by walking? _5  Pain in feet at night that wakes you up from your sleep? _6  Blood clot in your veins? _7  Leg swelling?  Pulmonary:  _8  Oxygen at home? _9  Productive cough? _10  Wheezing?  Neurologic:  _11  Sudden weakness in arms or legs? _12  Sudden numbness in arms or legs? _13  Sudden  onset of difficult speaking or slurred speech? _14  Temporary loss of vision in one eye? _15  Problems with dizziness?  Gastrointestinal:  _16  Blood in stool? _17  Vomited blood?  Genitourinary:  _18  Burning when urinating? _19  Blood in urine?  Psychiatric:  _20  Major depression  Hematologic:  _21  Bleeding problems? _22  Problems with blood clotting?  Dermatologic:  _23  Rashes or ulcers?  Constitutional:  _24  Fever or chills?  Ear/Nose/Throat:  _25  Change in hearing? _26  Nose bleeds? _27  Sore throat?  Musculoskeletal:  _28  Back pain? _29  Joint pain? _30  Muscle pain?   Physical Examination   Vitals:   09/12/20 1334  BP: (!) 153/84  Pulse: 74  Resp: 20  Temp: 98.3 F (36.8 C)  TempSrc: Temporal  SpO2: 98%  Weight: 256 lb 9.6 oz (116.4 kg)  Height: _31  (1.88 m)   Body mass index is 32.95 kg/m.  General:  WDWN in NAD; vital signs documented above Gait: Not observed HENT: WNL, normocephalic Pulmonary: normal non-labored breathing Cardiac: regular HR Abdomen: soft, NT, no masses Skin: without rashes Vascular Exam/Pulses:  Right Left  Radial 2+ (normal) 2+ (normal)   Extremities: Palpable thrill near arterial anastomosis; incision well-healed Musculoskeletal: no muscle wasting or atrophy  Neurologic: A&O X 3;  No focal weakness or paresthesias are detected Psychiatric:  The pt has Normal affect.   Non-invasive Vascular Imaging    Left arm fistula duplex demonstrates a mature brachiobasilic fistula greater than 6 mm from the surface of the skin    Medical Decision Making   SALIL RAINERI is a 70 y.o. 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    Patent left basilic vein fistula with no signs or symptoms of steal syndrome  Based on duplex, fistula is ready for second stage basilic vein transposition  This will be scheduled with Dr. Trula Slade in the next couple weeks Risk, benefits, and alternatives to access surgery were  discussed.   The patient is aware the risks include but are not limited to: bleeding, infection, steal syndrome, nerve damage, thrombosis, failure to mature, and need for additional procedures.   The patient agrees to proceed with the procedure.    Dagoberto Ligas PA-C [facility] Office: [phone]  Clinic MD: Donzetta Matters on call

## 2020-09-12 NOTE — H&P (View-Only) (Signed)
  Established Dialysis Access   History of Present Illness   Mark [name] is a 69 y.o. (11/14/1950) male who presents for re-evaluation of permanent access.  He is status post left first stage basilic vein fistula creation by Dr. Brabham on 07/16/2020.  He states the incision is well-healed.  He denies signs or symptoms of steal syndrome in his left hand.  He is not yet on HD.  CKD is managed by Dr. Coladonato.  He is willing to proceed with second stage basilic vein transposition.  The patient's PMH, PSH, SH, and FamHx were reviewed and are unchanged from prior visit.  Current Outpatient Medications  Medication Sig Dispense Refill  . acetaminophen (TYLENOL) 500 MG tablet Take 500 mg by mouth every 6 (six) hours as needed for moderate pain or headache.    . amLODipine (NORVASC) 10 MG tablet Take 1 tablet (10 mg total) by mouth daily. 30 tablet 6  . aspirin EC 81 MG tablet Take 1 tablet (81 mg total) by mouth daily. 30 tablet 5  . atorvastatin (LIPITOR) 20 MG tablet TAKE 1 TABLET BY MOUTH  DAILY 90 tablet 0  . Blood Glucose Monitoring Suppl (ONETOUCH VERIO) w/Device KIT Use as directed to test blood sugar three times daily. 1 kit 0  . calcitRIOL (ROCALTROL) 0.5 MCG capsule Take 0.5 mcg by mouth daily.    . carvedilol (COREG) 25 MG tablet Take 1 tablet (25 mg total) by mouth 2 (two) times daily with a meal. 60 tablet 6  . cholecalciferol (VITAMIN D3) 25 MCG (1000 UNIT) tablet Take 1,000 Units by mouth daily.    . docusate sodium (COLACE) 100 MG capsule Take 100 mg by mouth daily as needed for mild constipation.    . ferrous sulfate 325 (65 FE) MG tablet Take 325 mg by mouth 2 (two) times daily.    . furosemide (LASIX) 80 MG tablet Take 80 mg by mouth daily.    . gabapentin (NEURONTIN) 100 MG capsule Take 1 capsule (100 mg total) by mouth at bedtime. 30 capsule 3  . glucose blood (ONETOUCH VERIO) test strip Use as directed to test blood sugar three times daily. 100 each 12  . hydrALAZINE  (APRESOLINE) 25 MG tablet Take 1 tablet (25 mg total) by mouth 2 (two) times daily. 180 tablet 3  . Insulin Glargine (LANTUS SOLOSTAR) 100 UNIT/ML Solostar Pen Inject 10 Units into the skin at bedtime. (Patient taking differently: Inject 10 Units into the skin at bedtime as needed (bs >200).) 15 mL 0  . Insulin Pen Needle (TRUEPLUS PEN NEEDLES) 32G X 4 MM MISC Use as directed to administer lantus daily 100 each 1  . Insulin Syringe-Needle U-100 (INSULIN SYRINGE .5CC/30GX5/16") 30G X 5/16" 0.5 ML MISC Check blood sugar TID & QHS 100 each 2  . Multiple Vitamin (MULTIVITAMIN WITH MINERALS) TABS tablet Take 1 tablet by mouth daily.    . OneTouch Delica Lancets 33G MISC Use as directed to test blood sugar three times daily. 100 each 12  . OVER THE COUNTER MEDICATION Take 1 tablet by mouth daily. Kidney Factors    . sodium bicarbonate 650 MG tablet Take 1,950 mg by mouth 2 (two) times daily.     . HYDROcodone-acetaminophen (NORCO) 5-325 MG tablet Take 1 tablet by mouth every 6 (six) hours as needed for moderate pain. (Patient not taking: Reported on 09/12/2020) 10 tablet 0   No current facility-administered medications for this visit.   Facility-Administered Medications Ordered in Other Visits    Medication Dose Route Frequency Provider Last Rate Last Admin  . vancomycin (VANCOREADY) IVPB 1500 mg/300 mL  1,500 mg Intravenous Once Dickson, Christopher S, MD        REVIEW OF SYSTEMS (negative unless checked):   Cardiac:  [] Chest pain or chest pressure? [] Shortness of breath upon activity? [] Shortness of breath when lying flat? [] Irregular heart rhythm?  Vascular:  [] Pain in calf, thigh, or hip brought on by walking? [] Pain in feet at night that wakes you up from your sleep? [] Blood clot in your veins? [] Leg swelling?  Pulmonary:  [] Oxygen at home? [] Productive cough? [] Wheezing?  Neurologic:  [] Sudden weakness in arms or legs? [] Sudden numbness in arms or legs? [] Sudden  onset of difficult speaking or slurred speech? [] Temporary loss of vision in one eye? [] Problems with dizziness?  Gastrointestinal:  [] Blood in stool? [] Vomited blood?  Genitourinary:  [] Burning when urinating? [] Blood in urine?  Psychiatric:  [] Major depression  Hematologic:  [] Bleeding problems? [] Problems with blood clotting?  Dermatologic:  [] Rashes or ulcers?  Constitutional:  [] Fever or chills?  Ear/Nose/Throat:  [] Change in hearing? [] Nose bleeds? [] Sore throat?  Musculoskeletal:  [] Back pain? [] Joint pain? [] Muscle pain?   Physical Examination   Vitals:   09/12/20 1334  BP: (!) 153/84  Pulse: 74  Resp: 20  Temp: 98.3 F (36.8 C)  TempSrc: Temporal  SpO2: 98%  Weight: 256 lb 9.6 oz (116.4 kg)  Height: 6' 2" (1.88 m)   Body mass index is 32.95 kg/m.  General:  WDWN in NAD; vital signs documented above Gait: Not observed HENT: WNL, normocephalic Pulmonary: normal non-labored breathing Cardiac: regular HR Abdomen: soft, NT, no masses Skin: without rashes Vascular Exam/Pulses:  Right Left  Radial 2+ (normal) 2+ (normal)   Extremities: Palpable thrill near arterial anastomosis; incision well-healed Musculoskeletal: no muscle wasting or atrophy  Neurologic: A&O X 3;  No focal weakness or paresthesias are detected Psychiatric:  The pt has Normal affect.   Non-invasive Vascular Imaging    Left arm fistula duplex demonstrates a mature brachiobasilic fistula greater than 6 mm from the surface of the skin    Medical Decision Making   Mark [name] is a 69 y.o. 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    Patent left basilic vein fistula with no signs or symptoms of steal syndrome  Based on duplex, fistula is ready for second stage basilic vein transposition  This will be scheduled with Dr. Brabham in the next couple weeks Risk, benefits, and alternatives to access surgery were  discussed.   The patient is aware the risks include but are not limited to: bleeding, infection, steal syndrome, nerve damage, thrombosis, failure to mature, and need for additional procedures.   The patient agrees to proceed with the procedure.    Ulyana Pitones PA-C [facility] Office: [phone]  Clinic MD: Cain on call  

## 2020-09-17 ENCOUNTER — Encounter: Payer: Self-pay | Admitting: Internal Medicine

## 2020-09-17 NOTE — Progress Notes (Signed)
Patient was seen by nephrology PA on 09/08/2020.  Patient has CKD stage V.  He has had AV fistula placed as of 07/16/2020.  He was noted to be a little fluid overloaded on exam.  Lasix increased to 80 mg in the morning and 40 mg in the p.m.  He was to return in 2 weeks to recheck edema/BP/renal function.  His BUN is 78, creatinine 7.4, GFR of 8.  Hemoglobin 9.8 hematocrit of 30 with MCV of 94, ferritin 271, iron 58, iron sat is 31%..  Patient receives monthly Retacrit injections.

## 2020-09-18 ENCOUNTER — Ambulatory Visit (HOSPITAL_COMMUNITY)
Admission: RE | Admit: 2020-09-18 | Discharge: 2020-09-18 | Disposition: A | Discharge status: Home / Self Care | Payer: Medicare Other | Source: Ambulatory Visit | Attending: Nephrology | Admitting: Nephrology

## 2020-09-18 ENCOUNTER — Other Ambulatory Visit: Payer: Self-pay

## 2020-09-18 VITALS — BP 166/82 | HR 77 | Temp 97.8°F | Resp 18

## 2020-09-18 DIAGNOSIS — N183 Chronic kidney disease, stage 3 unspecified: Secondary | ICD-10-CM

## 2020-09-18 LAB — POCT HEMOGLOBIN-HEMACUE: Hemoglobin: 9.9 g/dL — ABNORMAL LOW (ref 13.0–17.0)

## 2020-09-18 MED ORDER — EPOETIN ALFA-EPBX 10000 UNIT/ML IJ SOLN
20000.0000 [IU] | INTRAMUSCULAR | Status: DC
  Administered 2020-09-18: 20000 [IU] via SUBCUTANEOUS

## 2020-09-18 MED ORDER — EPOETIN ALFA-EPBX 10000 UNIT/ML IJ SOLN
INTRAMUSCULAR | Status: AC
  Filled 2020-09-18: qty 2

## 2020-09-19 ENCOUNTER — Other Ambulatory Visit: Payer: Self-pay

## 2020-09-22 DIAGNOSIS — N2581 Secondary hyperparathyroidism of renal origin: Secondary | ICD-10-CM | POA: Diagnosis not present

## 2020-09-22 DIAGNOSIS — I5042 Chronic combined systolic (congestive) and diastolic (congestive) heart failure: Secondary | ICD-10-CM | POA: Diagnosis not present

## 2020-09-22 DIAGNOSIS — N185 Chronic kidney disease, stage 5: Secondary | ICD-10-CM | POA: Diagnosis not present

## 2020-09-22 DIAGNOSIS — D631 Anemia in chronic kidney disease: Secondary | ICD-10-CM | POA: Diagnosis not present

## 2020-09-22 DIAGNOSIS — E875 Hyperkalemia: Secondary | ICD-10-CM | POA: Diagnosis not present

## 2020-09-22 DIAGNOSIS — I120 Hypertensive chronic kidney disease with stage 5 chronic kidney disease or end stage renal disease: Secondary | ICD-10-CM | POA: Diagnosis not present

## 2020-09-22 DIAGNOSIS — I770 Arteriovenous fistula, acquired: Secondary | ICD-10-CM | POA: Diagnosis not present

## 2020-09-22 DIAGNOSIS — E1122 Type 2 diabetes mellitus with diabetic chronic kidney disease: Secondary | ICD-10-CM | POA: Diagnosis not present

## 2020-09-23 ENCOUNTER — Encounter (HOSPITAL_COMMUNITY): Payer: Self-pay | Admitting: Surgery

## 2020-09-23 NOTE — Progress Notes (Signed)
[facility] [address], [place] [address] Phone: [phone] Fax: [phone]      Your procedure is scheduled on Friday, September 26, 2020 (7:30AM - 9:09AM).  Report to [facility] Main Entrance "A" at 5:30 A.M., and check in at the Admitting office.  Call this number if you have problems the morning of surgery:  [phone]  Call [phone] if you have any questions prior to your surgery date Monday-Friday 8am-4pm    Remember:  Do not eat or drink after midnight the night before your surgery    Take these medicines the morning of surgery with A SIP OF WATER:  amLODipine (NORVASC) aspirin EC atorvastatin (LIPITOR) carvedilol (COREG) acetaminophen (TYLENOL) - if needed  As of today, STOP taking any Aleve, Naproxen, Ibuprofen, Motrin, Advil, Goody's, BC's, all herbal medications, fish oil, and all vitamins.   WHAT DO I DO ABOUT MY DIABETES MEDICATION?  . If your CBG is greater than 220 mg/dL, you may take (5 units) of your Insulin Glargine (LANTUS SOLOSTAR).   HOW TO MANAGE YOUR DIABETES BEFORE AND AFTER SURGERY  Why is it important to control my blood sugar before and after surgery? . Improving blood sugar levels before and after surgery helps healing and can limit problems. . A way of improving blood sugar control is eating a healthy diet by: o  Eating less sugar and carbohydrates o  Increasing activity/exercise o  Talking with your doctor about reaching your blood sugar goals . High blood sugars (greater than 180 mg/dL) can raise your risk of infections and slow your recovery, so you will need to focus on controlling your diabetes during the weeks before surgery. . Make sure that the doctor who takes care of your diabetes knows about your planned surgery including the date and location.  How do I manage my blood sugar before surgery? . Check your blood sugar at least 4 times a day,  starting 2 days before surgery, to make sure that the level is not too high or low. . Check your blood sugar the morning of your surgery when you wake up and every 2 hours until you get to the Short Stay unit. o If your blood sugar is less than 70 mg/dL, you will need to treat for low blood sugar: - Do not take insulin. - Treat a low blood sugar (less than 70 mg/dL) with  cup of clear juice (cranberry or apple), 4 glucose tablets, OR glucose gel. - Recheck blood sugar in 15 minutes after treatment (to make sure it is greater than 70 mg/dL). If your blood sugar is not greater than 70 mg/dL on recheck, call [phone] for further instructions. . Report your blood sugar to the short stay nurse when you get to Short Stay.  . If you are admitted to the hospital after surgery: o Your blood sugar will be checked by the staff and you will probably be given insulin after surgery (instead of oral diabetes medicines) to make sure you have good blood sugar levels. o The goal for blood sugar control after surgery is 80-180 mg/dL.                      Do not wear jewelry.            Do not wear lotions, powders, colognes, or deodorant.            Do not shave 48 hours prior  to surgery.  Men may shave face and neck.            Do not bring valuables to the hospital.            [facility] is not responsible for any belongings or valuables.  Do NOT Smoke (Tobacco/Vaping) or drink Alcohol 24 hours prior to your procedure If you use a CPAP at night, you may bring all equipment for your overnight stay.   Contacts, glasses, dentures or bridgework may not be worn into surgery.      For patients admitted to the hospital, discharge time will be determined by your treatment team.   Patients discharged the day of surgery will not be allowed to drive home, and someone needs to stay with them for 24 hours.   Oral Hygiene is also important to reduce your risk of infection.  Remember - BRUSH YOUR TEETH THE  MORNING OF SURGERY WITH YOUR REGULAR TOOTHPASTE  Shower the with mild Soap.    Day of Surgery: Wear Clean/Comfortable clothing the morning of surgery Do not apply any deodorants/lotions.

## 2020-09-24 ENCOUNTER — Other Ambulatory Visit (HOSPITAL_COMMUNITY)
Admission: RE | Admit: 2020-09-24 | Discharge: 2020-09-24 | Disposition: A | Discharge status: Home / Self Care | Payer: Medicare Other | Source: Ambulatory Visit | Attending: Surgery | Admitting: Surgery

## 2020-09-24 DIAGNOSIS — Z20822 Contact with and (suspected) exposure to covid-19: Secondary | ICD-10-CM | POA: Insufficient documentation

## 2020-09-24 DIAGNOSIS — Z01812 Encounter for preprocedural laboratory examination: Secondary | ICD-10-CM | POA: Insufficient documentation

## 2020-09-24 LAB — SARS CORONAVIRUS 2 (TAT 6-24 HRS): SARS Coronavirus 2: NEGATIVE

## 2020-09-25 ENCOUNTER — Other Ambulatory Visit (HOSPITAL_COMMUNITY): Payer: Medicare Other

## 2020-09-25 NOTE — Anesthesia Preprocedure Evaluation (Signed)
Anesthesia Evaluation  Patient identified by MRN, date of birth, ID band Patient awake    Reviewed: Allergy & Precautions, NPO status , Patient's Chart, lab work & pertinent test results, reviewed documented beta blocker date and time   History of Anesthesia Complications Negative for: history of anesthetic complications  Airway Mallampati: I  TM Distance: >3 FB Neck ROM: Full    Dental  (+) Edentulous Upper, Edentulous Lower   Pulmonary pneumonia, resolved, former smoker,    Pulmonary exam normal        Cardiovascular hypertension, Pt. on home beta blockers and Pt. on medications + Peripheral Vascular Disease and +CHF  Normal cardiovascular exam     Neuro/Psych Seizures -,   Neuromuscular disease    GI/Hepatic Neg liver ROS, GERD  ,  Endo/Other  negative endocrine ROSdiabetes  Renal/GU Renal disease     Musculoskeletal  (+) Arthritis ,   Abdominal (+) - obese,   Peds  Hematology  (+) Blood dyscrasia, anemia ,   Anesthesia Other Findings   Reproductive/Obstetrics                           Anesthesia Physical  Anesthesia Plan  ASA: III  Anesthesia Plan: General   Post-op Pain Management:    Induction: Intravenous  PONV Risk Score and Plan: 2, 1 and 3 and Ondansetron, Dexamethasone and Treatment may vary due to age or medical condition  Airway Management Planned: LMA  Additional Equipment: None  Intra-op Plan:   Post-operative Plan:   Informed Consent: I have reviewed the patients History and Physical, chart, labs and discussed the procedure including the risks, benefits and alternatives for the proposed anesthesia with the patient or authorized representative who has indicated his/her understanding and acceptance.     Dental advisory given  Plan Discussed with: Anesthesiologist and CRNA  Anesthesia Plan Comments:        Anesthesia Quick Evaluation

## 2020-09-26 ENCOUNTER — Ambulatory Visit (HOSPITAL_COMMUNITY)
Admission: RE | Admit: 2020-09-26 | Discharge: 2020-09-26 | Disposition: A | Discharge status: Home / Self Care | Payer: Medicare Other | Attending: Surgery | Admitting: Surgery

## 2020-09-26 ENCOUNTER — Encounter (HOSPITAL_COMMUNITY)
Admission: RE | Disposition: A | Discharge status: Home / Self Care | Payer: Self-pay | Source: Home / Self Care | Attending: Surgery

## 2020-09-26 ENCOUNTER — Ambulatory Visit (HOSPITAL_COMMUNITY): Payer: Medicare Other | Admitting: Physician Assistant

## 2020-09-26 ENCOUNTER — Telehealth: Payer: Self-pay

## 2020-09-26 ENCOUNTER — Encounter (HOSPITAL_COMMUNITY): Payer: Self-pay | Admitting: Surgery

## 2020-09-26 ENCOUNTER — Other Ambulatory Visit: Payer: Self-pay

## 2020-09-26 DIAGNOSIS — Z794 Long term (current) use of insulin: Secondary | ICD-10-CM | POA: Insufficient documentation

## 2020-09-26 DIAGNOSIS — Z7982 Long term (current) use of aspirin: Secondary | ICD-10-CM | POA: Diagnosis not present

## 2020-09-26 DIAGNOSIS — I132 Hypertensive heart and chronic kidney disease with heart failure and with stage 5 chronic kidney disease, or end stage renal disease: Secondary | ICD-10-CM | POA: Diagnosis not present

## 2020-09-26 DIAGNOSIS — Z79899 Other long term (current) drug therapy: Secondary | ICD-10-CM | POA: Diagnosis not present

## 2020-09-26 DIAGNOSIS — N185 Chronic kidney disease, stage 5: Secondary | ICD-10-CM | POA: Insufficient documentation

## 2020-09-26 DIAGNOSIS — N183 Chronic kidney disease, stage 3 unspecified: Secondary | ICD-10-CM

## 2020-09-26 DIAGNOSIS — I5032 Chronic diastolic (congestive) heart failure: Secondary | ICD-10-CM | POA: Diagnosis not present

## 2020-09-26 DIAGNOSIS — D509 Iron deficiency anemia, unspecified: Secondary | ICD-10-CM | POA: Diagnosis not present

## 2020-09-26 HISTORY — DX: Malignant (primary) neoplasm, unspecified: C80.1

## 2020-09-26 HISTORY — PX: BASCILIC VEIN TRANSPOSITION: SHX5742

## 2020-09-26 LAB — SURGICAL PCR SCREEN
MRSA, PCR: NEGATIVE
Staphylococcus aureus: NEGATIVE

## 2020-09-26 LAB — POCT I-STAT, CHEM 8
BUN: 72 mg/dL — ABNORMAL HIGH (ref 8–23)
Calcium, Ion: 1.25 mmol/L (ref 1.15–1.40)
Chloride: 117 mmol/L — ABNORMAL HIGH (ref 98–111)
Creatinine, Ser: 9.8 mg/dL — ABNORMAL HIGH (ref 0.61–1.24)
Glucose, Bld: 136 mg/dL — ABNORMAL HIGH (ref 70–99)
HCT: 37 % — ABNORMAL LOW (ref 39.0–52.0)
Hemoglobin: 12.6 g/dL — ABNORMAL LOW (ref 13.0–17.0)
Potassium: 5.6 mmol/L — ABNORMAL HIGH (ref 3.5–5.1)
Sodium: 145 mmol/L (ref 135–145)
TCO2: 20 mmol/L — ABNORMAL LOW (ref 22–32)

## 2020-09-26 LAB — GLUCOSE, CAPILLARY
Glucose-Capillary: 128 mg/dL — ABNORMAL HIGH (ref 70–99)
Glucose-Capillary: 106 mg/dL — ABNORMAL HIGH (ref 70–99)

## 2020-09-26 SURGERY — TRANSPOSITION, VEIN, BASILIC
Anesthesia: General | Site: Arm Upper | Laterality: Left

## 2020-09-26 MED ORDER — HYDROCODONE-ACETAMINOPHEN 5-325 MG PO TABS: 1.0000 | ORAL_TABLET | ORAL | 0 refills | Status: DC | PRN

## 2020-09-26 MED ORDER — MIDAZOLAM HCL 5 MG/5ML IJ SOLN
INTRAMUSCULAR | Status: DC | PRN
  Administered 2020-09-26: 2 mg via INTRAVENOUS

## 2020-09-26 MED ORDER — EPHEDRINE SULFATE-NACL 50-0.9 MG/10ML-% IV SOSY
PREFILLED_SYRINGE | INTRAVENOUS | Status: DC | PRN
  Administered 2020-09-26 (×2): 10 mg via INTRAVENOUS

## 2020-09-26 MED ORDER — ONDANSETRON HCL 4 MG/2ML IJ SOLN
INTRAMUSCULAR | Status: DC | PRN
  Administered 2020-09-26: 4 mg via INTRAVENOUS

## 2020-09-26 MED ORDER — PROPOFOL 10 MG/ML IV BOLUS
INTRAVENOUS | Status: DC | PRN
  Administered 2020-09-26: 50 mg via INTRAVENOUS
  Administered 2020-09-26: 150 mg via INTRAVENOUS
  Administered 2020-09-26: 50 mg via INTRAVENOUS
  Administered 2020-09-26: 60 mg via INTRAVENOUS
  Administered 2020-09-26: 30 mg via INTRAVENOUS

## 2020-09-26 MED ORDER — FENTANYL CITRATE (PF) 250 MCG/5ML IJ SOLN
INTRAMUSCULAR | Status: AC
  Filled 2020-09-26: qty 5

## 2020-09-26 MED ORDER — SODIUM CHLORIDE 0.9 % IV SOLN
INTRAVENOUS | Status: AC
  Filled 2020-09-26: qty 1.2

## 2020-09-26 MED ORDER — MIDAZOLAM HCL 2 MG/2ML IJ SOLN
INTRAMUSCULAR | Status: AC
  Filled 2020-09-26: qty 2

## 2020-09-26 MED ORDER — PROPOFOL 10 MG/ML IV BOLUS
INTRAVENOUS | Status: AC
  Filled 2020-09-26: qty 40

## 2020-09-26 MED ORDER — CELECOXIB 200 MG PO CAPS
200.0000 mg | ORAL_CAPSULE | Freq: Once | ORAL | Status: AC
  Administered 2020-09-26: 200 mg via ORAL
  Filled 2020-09-26: qty 1

## 2020-09-26 MED ORDER — PROPOFOL 10 MG/ML IV BOLUS
INTRAVENOUS | Status: AC
  Filled 2020-09-26: qty 20

## 2020-09-26 MED ORDER — ACETAMINOPHEN 500 MG PO TABS
1000.0000 mg | ORAL_TABLET | Freq: Once | ORAL | Status: AC
  Administered 2020-09-26: 1000 mg via ORAL
  Filled 2020-09-26: qty 2

## 2020-09-26 MED ORDER — CEFAZOLIN SODIUM-DEXTROSE 2-4 GM/100ML-% IV SOLN
2.0000 g | INTRAVENOUS | Status: AC
  Administered 2020-09-26: 2 g via INTRAVENOUS
  Filled 2020-09-26: qty 100

## 2020-09-26 MED ORDER — CHLORHEXIDINE GLUCONATE 4 % EX LIQD: 60.0000 mL | Freq: Once | CUTANEOUS | Status: DC

## 2020-09-26 MED ORDER — BUPIVACAINE LIPOSOME 1.3 % IJ SUSP
INTRAMUSCULAR | Status: DC | PRN
  Administered 2020-09-26: 70 mL

## 2020-09-26 MED ORDER — BUPIVACAINE HCL (PF) 0.5 % IJ SOLN
INTRAMUSCULAR | Status: AC
  Filled 2020-09-26: qty 30

## 2020-09-26 MED ORDER — LIDOCAINE 2% (20 MG/ML) 5 ML SYRINGE
INTRAMUSCULAR | Status: AC
  Filled 2020-09-26: qty 5

## 2020-09-26 MED ORDER — FENTANYL CITRATE (PF) 100 MCG/2ML IJ SOLN
INTRAMUSCULAR | Status: DC | PRN
  Administered 2020-09-26 (×3): 50 ug via INTRAVENOUS

## 2020-09-26 MED ORDER — LIDOCAINE 2% (20 MG/ML) 5 ML SYRINGE
INTRAMUSCULAR | Status: DC | PRN
  Administered 2020-09-26: 50 mg via INTRAVENOUS

## 2020-09-26 MED ORDER — SODIUM CHLORIDE 0.9 % IV SOLN: INTRAVENOUS | Status: DC

## 2020-09-26 MED ORDER — KETAMINE HCL 10 MG/ML IJ SOLN
INTRAMUSCULAR | Status: DC | PRN
  Administered 2020-09-26: 15 mg via INTRAVENOUS
  Administered 2020-09-26: 10 mg via INTRAVENOUS
  Administered 2020-09-26: 25 mg via INTRAVENOUS

## 2020-09-26 MED ORDER — SUCCINYLCHOLINE CHLORIDE 200 MG/10ML IV SOSY
PREFILLED_SYRINGE | INTRAVENOUS | Status: AC
  Filled 2020-09-26: qty 10

## 2020-09-26 MED ORDER — 0.9 % SODIUM CHLORIDE (POUR BTL) OPTIME
TOPICAL | Status: DC | PRN
  Administered 2020-09-26: 1000 mL

## 2020-09-26 MED ORDER — CARVEDILOL 12.5 MG PO TABS
25.0000 mg | ORAL_TABLET | Freq: Once | ORAL | Status: AC
  Administered 2020-09-26: 25 mg via ORAL
  Filled 2020-09-26: qty 2

## 2020-09-26 MED ORDER — LIDOCAINE-EPINEPHRINE (PF) 1 %-1:200000 IJ SOLN
INTRAMUSCULAR | Status: AC
  Filled 2020-09-26: qty 30

## 2020-09-26 MED ORDER — BUPIVACAINE LIPOSOME 1.3 % IJ SUSP
20.0000 mL | INTRAMUSCULAR | Status: DC
  Filled 2020-09-26: qty 20

## 2020-09-26 MED ORDER — SODIUM CHLORIDE 0.9 % IV SOLN: INTRAVENOUS | Status: DC | PRN

## 2020-09-26 MED ORDER — PHENYLEPHRINE HCL-NACL 10-0.9 MG/250ML-% IV SOLN
INTRAVENOUS | Status: DC | PRN
  Administered 2020-09-26: 40 ug/min via INTRAVENOUS

## 2020-09-26 MED ORDER — CHLORHEXIDINE GLUCONATE 0.12 % MT SOLN
OROMUCOSAL | Status: AC
  Administered 2020-09-26: 15 mL
  Filled 2020-09-26: qty 15

## 2020-09-26 MED ORDER — SODIUM CHLORIDE 0.9 % IV SOLN
INTRAVENOUS | Status: DC | PRN
  Administered 2020-09-26: 500 mL

## 2020-09-26 MED ORDER — ROCURONIUM BROMIDE 10 MG/ML (PF) SYRINGE
PREFILLED_SYRINGE | INTRAVENOUS | Status: AC
  Filled 2020-09-26: qty 10

## 2020-09-26 SURGICAL SUPPLY — 47 items
ADH SKN CLS APL DERMABOND .7 (GAUZE/BANDAGES/DRESSINGS) ×1
ARMBAND PINK RESTRICT EXTREMIT (MISCELLANEOUS) ×2 IMPLANT
CANISTER SUCT 3000ML PPV (MISCELLANEOUS) ×2 IMPLANT
CATH EMB 3FR 40CM (CATHETERS) ×1 IMPLANT
CLIP VESOCCLUDE MED 24/CT (CLIP) IMPLANT
CLIP VESOCCLUDE MED 6/CT (CLIP) ×2 IMPLANT
CLIP VESOCCLUDE SM WIDE 24/CT (CLIP) IMPLANT
CLIP VESOCCLUDE SM WIDE 6/CT (CLIP) ×1 IMPLANT
CNTNR URN SCR LID CUP LEK RST (MISCELLANEOUS) IMPLANT
CONT SPEC 4OZ STRL OR WHT (MISCELLANEOUS) ×2
COVER PROBE W GEL 5X96 (DRAPES) ×2 IMPLANT
COVER WAND RF STERILE (DRAPES) ×1 IMPLANT
DERMABOND ADVANCED (GAUZE/BANDAGES/DRESSINGS) ×1
DERMABOND ADVANCED .7 DNX12 (GAUZE/BANDAGES/DRESSINGS) ×1 IMPLANT
ELECT REM PT RETURN 9FT ADLT (ELECTROSURGICAL) ×2
ELECTRODE REM PT RTRN 9FT ADLT (ELECTROSURGICAL) ×1 IMPLANT
GLOVE BIOGEL PI IND STRL 7.5 (GLOVE) ×1 IMPLANT
GLOVE BIOGEL PI INDICATOR 7.5 (GLOVE) ×1
GLOVE ECLIPSE 6.5 STRL STRAW (GLOVE) ×2 IMPLANT
GLOVE SS BIOGEL STRL SZ 6.5 (GLOVE) IMPLANT
GLOVE SUPERSENSE BIOGEL SZ 6.5 (GLOVE) ×1
GLOVE SURG SS PI 7.5 STRL IVOR (GLOVE) ×2 IMPLANT
GLOVE SURG UNDER POLY LF SZ6.5 (GLOVE) ×2 IMPLANT
GLOVE SURG UNDER POLY LF SZ7.5 (GLOVE) ×1 IMPLANT
GOWN STRL REUS W/ TWL LRG LVL3 (GOWN DISPOSABLE) ×2 IMPLANT
GOWN STRL REUS W/ TWL XL LVL3 (GOWN DISPOSABLE) ×1 IMPLANT
GOWN STRL REUS W/TWL LRG LVL3 (GOWN DISPOSABLE) ×8
GOWN STRL REUS W/TWL XL LVL3 (GOWN DISPOSABLE) ×2
HEMOSTAT SNOW SURGICEL 2X4 (HEMOSTASIS) IMPLANT
KIT BASIN OR (CUSTOM PROCEDURE TRAY) ×2 IMPLANT
KIT TURNOVER KIT B (KITS) ×2 IMPLANT
MARKER SKIN DUAL TIP RULER LAB (MISCELLANEOUS) ×1 IMPLANT
NDL 18GX1X1/2 (RX/OR ONLY) (NEEDLE) IMPLANT
NEEDLE 18GX1X1/2 (RX/OR ONLY) (NEEDLE) ×2 IMPLANT
NS IRRIG 1000ML POUR BTL (IV SOLUTION) ×2 IMPLANT
PACK CV ACCESS (CUSTOM PROCEDURE TRAY) ×2 IMPLANT
PAD ARMBOARD 7.5X6 YLW CONV (MISCELLANEOUS) ×4 IMPLANT
SUT PROLENE 6 0 CC (SUTURE) ×2 IMPLANT
SUT SILK 2 0 SH (SUTURE) IMPLANT
SUT VIC AB 3-0 SH 27 (SUTURE) ×4
SUT VIC AB 3-0 SH 27X BRD (SUTURE) ×1 IMPLANT
SUT VICRYL 4-0 PS2 18IN ABS (SUTURE) ×2 IMPLANT
SYR 3ML LL SCALE MARK (SYRINGE) ×1 IMPLANT
SYR 50ML LL SCALE MARK (SYRINGE) ×1 IMPLANT
TOWEL GREEN STERILE (TOWEL DISPOSABLE) ×2 IMPLANT
UNDERPAD 30X36 HEAVY ABSORB (UNDERPADS AND DIAPERS) ×2 IMPLANT
WATER STERILE IRR 1000ML POUR (IV SOLUTION) ×2 IMPLANT

## 2020-09-26 NOTE — Interval H&P Note (Signed)
History and Physical Interval Note:  09/26/2020 7:25 AM  Mark [name]  has presented today for surgery, with the diagnosis of CHRONIC KIDNEY DISEASE STAGE 5.  The various methods of treatment have been discussed with the patient and family. After consideration of risks, benefits and other options for treatment, the patient has consented to  Procedure(s): LEFT ARM SECOND STAGE [place] (Left) as a surgical intervention.  The patient's history has been reviewed, patient examined, no change in status, stable for surgery.  I have reviewed the patient's chart and labs.  Questions were answered to the patient's satisfaction.     [name]

## 2020-09-26 NOTE — Discharge Instructions (Addendum)
° °  [facility] ° °Discharge Instructions ° °AV Fistula or Graft Surgery for Dialysis Access ° °Please refer to the following instructions for your post-procedure care. Your surgeon or physician assistant will discuss any changes with you. ° °Activity ° °You may drive the day following your surgery, if you are comfortable and no longer taking prescription pain medication. Resume full activity as the soreness in your incision resolves. ° °Bathing/Showering ° °You may shower after you go home. Keep your incision dry for 48 hours. Do not soak in a bathtub, hot tub, or swim until the incision heals completely. You may not shower if you have a hemodialysis catheter. ° °Incision Care ° °Clean your incision with mild soap and water after 48 hours. Pat the area dry with a clean towel. You do not need a bandage unless otherwise instructed. Do not apply any ointments or creams to your incision. You may have skin glue on your incision. Do not peel it off. It will come off on its own in about one week. Your arm may swell a bit after surgery. To reduce swelling use pillows to elevate your arm so it is above your heart. Your doctor will tell you if you need to lightly wrap your arm with an ACE bandage. ° °Diet ° °Resume your normal diet. There are not special food restrictions following this procedure. In order to heal from your surgery, it is CRITICAL to get adequate nutrition. Your body requires vitamins, minerals, and protein. Vegetables are the best source of vitamins and minerals. Vegetables also provide the perfect balance of protein. Processed food has little nutritional value, so try to avoid this. ° °Medications ° °Resume taking all of your medications. If your incision is causing pain, you may take over-the counter pain relievers such as acetaminophen (Tylenol). If you were prescribed a stronger pain medication, please be aware these medications can cause nausea and constipation. Prevent  nausea by taking the medication with a snack or meal. Avoid constipation by drinking plenty of fluids and eating foods with high amount of fiber, such as fruits, vegetables, and grains. Do not take Tylenol if you are taking prescription pain medications. ° ° ° ° °Follow up °Your surgeon may want to see you in the office following your access surgery. If so, this will be arranged at the time of your surgery. ° °Please call us immediately for any of the following conditions: ° °Increased pain, redness, drainage (pus) from your incision site °Fever of 101 degrees or higher °Severe or worsening pain at your incision site °Hand pain or numbness. ° °Reduce your risk of vascular disease: ° °Stop smoking. If you would like help, call [facility] at 1-800-QUIT-NOW (1-800-784-8669) or [facility] at 336-586-4000 ° °Manage your cholesterol °Maintain a desired weight °Control your diabetes °Keep your blood pressure down ° °Dialysis ° °It will take several weeks to several months for your new dialysis access to be ready for use. Your surgeon will determine when it is OK to use it. Your nephrologist will continue to direct your dialysis. You can continue to use your Permcath until your new access is ready for use. ° °If you have any questions, please call the office at [phone]. ° °

## 2020-09-26 NOTE — Anesthesia Procedure Notes (Signed)
Procedure Name: LMA Insertion Date/Time: 09/26/2020 7:46 AM Performed by: Hoy Morn, CRNA Pre-anesthesia Checklist: Patient identified, Emergency Drugs available, Suction available, Patient being monitored and Timeout performed Patient Re-evaluated:Patient Re-evaluated prior to induction Oxygen Delivery Method: Circle system utilized Preoxygenation: Pre-oxygenation with 100% oxygen Induction Type: IV induction LMA: LMA inserted LMA Size: 4.0 Number of attempts: 1 Airway Equipment and Method: Bite block Dental Injury: Teeth and Oropharynx as per pre-operative assessment

## 2020-09-26 NOTE — Anesthesia Postprocedure Evaluation (Signed)
Anesthesia Post Note  Patient: Mark [name]  Procedure(s) Performed: LEFT ARM SECOND STAGE BASCILIC VEIN TRANSPOSITION (Left Arm Upper)     Patient location during evaluation: PACU Anesthesia Type: General Level of consciousness: sedated Pain management: pain level controlled Vital Signs Assessment: post-procedure vital signs reviewed and stable Respiratory status: spontaneous breathing and respiratory function stable Cardiovascular status: stable Postop Assessment: no apparent nausea or vomiting Anesthetic complications: no   No complications documented.  Last Vitals:  Vitals:   09/26/20 0945 09/26/20 1030  BP:  (!) 170/77  Pulse:  64  Resp:  15  Temp: (!) 36.2 C 36.6 C  SpO2:  95%    Last Pain:  Vitals:   09/26/20 1030  TempSrc:   PainSc: 0-No pain                 [name]

## 2020-09-26 NOTE — Transfer of Care (Signed)
Immediate Anesthesia Transfer of Care Note  Patient: STANLEY LYNESS  Procedure(s) Performed: LEFT ARM SECOND STAGE BASCILIC VEIN TRANSPOSITION (Left Arm Upper)  Patient Location: PACU  Anesthesia Type:General  Level of Consciousness: awake  Airway & Oxygen Therapy: Patient Spontanous Breathing and Patient connected to face mask oxygen  Post-op Assessment: Report given to RN and Post -op Vital signs reviewed and stable  Post vital signs: Reviewed and stable  Last Vitals:  Vitals Value Taken Time  BP 181/93 09/26/20 0959  Temp 36.2 C 09/26/20 0945  Pulse 71 09/26/20 1000  Resp 17 09/26/20 1000  SpO2 99 % 09/26/20 1000  Vitals shown include unvalidated device data.  Last Pain:  Vitals:   09/26/20 0945  TempSrc:   PainSc: 0-No pain      Patients Stated Pain Goal: 2 (54/65/68 1275)  Complications: No complications documented.

## 2020-09-26 NOTE — Telephone Encounter (Signed)
Pt's wife called with concerns about pt having his second stage surgery this AM and a small amount of drainage is coming out. It sounds very minimal. I have encouraged her to keep an eye on it and to keep area clean and dry. She is aware to call back if this does not resolve or anything changes.

## 2020-09-27 ENCOUNTER — Encounter (HOSPITAL_COMMUNITY): Payer: Self-pay | Admitting: Surgery

## 2020-09-27 NOTE — Op Note (Signed)
    Patient name: Mark [name] MRN: [id number] DOB: 08/11/1951 Sex: male  09/26/2020 Pre-operative Diagnosis: Chronic renal insufficiency Post-operative diagnosis:  Same Surgeon:  [name] Assistants: Risa Grill Procedure:   Second stage left basilic vein fistula creation Anesthesia: General Blood Loss: Minimal Specimens: None  Findings: Excellent caliber basilic vein.  Anastomosis was performed to the antecubital crease.  Exparel was used for local anesthesia.  Indications: The patient is not yet on dialysis.  He comes in today for second stage basilic vein fistula creation  Procedure:  The patient was identified in the holding area and taken to [place] 12  The patient was then placed supine on the table. general anesthesia was administered.  The patient was prepped and draped in the usual sterile fashion.  A time out was called and antibiotics were administered.  A PA was necessary to expedite the procedure and assist with technical details including suction retraction and exposure.  Ultrasound was used to evaluate the basilic vein in the left upper arm.  This was widely patent and of excellent diameter measuring 5-7 mm.  Next, 2 longitudinal incisions were made in the upper arm over top of the vein.  Cautery and sharp dissection was used to circumferentially dissect free the vein from the axilla down to the antecubital crease.  The nerve was protected.  Side branches were ligated between silk ties.  Once the vein was fully mobilized, it was marked for orientation.  It was then occluded in the axilla with the baby Gregory clamps and at the arteriovenous anastomosis with a baby Gregory clamp.  The vein was then divided near the antecubital crease.  Exparel was used for local anesthesia in the tunnel.  A subcutaneous tunnel was created with a curved Gore tunneler.  The vein was then brought through the tunnel making sure to maintain proper orientation.  Next a end to end anastomosis  was created with running 6-0 Prolene.  Prior to completion the appropriate flushing maneuvers were performed the anastomosis was completed.  The patient had an excellent thrill within the fistula and a palpable radial pulse.  The wounds were then copiously irrigated.  Hemostasis was achieved.  The incisions were closed with 2 layers of Vicryl followed by Dermabond.  There were no immediate complications.   Disposition: To PACU stable.   Theotis Burrow, M.D., [facility] [place] Office: [phone] Pager:  [phone]

## 2020-10-06 ENCOUNTER — Telehealth: Payer: Self-pay | Admitting: Nurse Practitioner

## 2020-10-06 NOTE — Telephone Encounter (Signed)
Scheduled appt per 2/4 sch msg - left message for patient with appt date and time

## 2020-10-13 ENCOUNTER — Encounter: Payer: Self-pay | Admitting: Hematology

## 2020-10-13 ENCOUNTER — Inpatient Hospital Stay: Payer: Medicare Other | Attending: Nurse Practitioner | Admitting: Hematology

## 2020-10-13 DIAGNOSIS — D472 Monoclonal gammopathy: Secondary | ICD-10-CM | POA: Diagnosis not present
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
## 2020-10-14 ENCOUNTER — Telehealth: Payer: Self-pay | Admitting: Hematology

## 2020-10-14 ENCOUNTER — Encounter: Payer: Medicare Other | Admitting: Vascular Surgery

## 2020-10-14 ENCOUNTER — Encounter (HOSPITAL_COMMUNITY): Payer: Medicare Other

## 2020-10-14 NOTE — Telephone Encounter (Signed)
Left message with follow-up appointments per 2/14 los. Gave option to call back to reschedule if needed.

## 2020-10-16 ENCOUNTER — Encounter (HOSPITAL_COMMUNITY): Payer: Medicare Other

## 2020-10-21 DIAGNOSIS — N185 Chronic kidney disease, stage 5: Secondary | ICD-10-CM | POA: Diagnosis not present

## 2020-10-21 DIAGNOSIS — I770 Arteriovenous fistula, acquired: Secondary | ICD-10-CM | POA: Diagnosis not present

## 2020-10-21 DIAGNOSIS — N189 Chronic kidney disease, unspecified: Secondary | ICD-10-CM | POA: Diagnosis not present

## 2020-10-21 DIAGNOSIS — I120 Hypertensive chronic kidney disease with stage 5 chronic kidney disease or end stage renal disease: Secondary | ICD-10-CM | POA: Diagnosis not present

## 2020-10-21 DIAGNOSIS — D631 Anemia in chronic kidney disease: Secondary | ICD-10-CM | POA: Diagnosis not present

## 2020-10-21 DIAGNOSIS — E875 Hyperkalemia: Secondary | ICD-10-CM | POA: Diagnosis not present

## 2020-10-21 DIAGNOSIS — N2581 Secondary hyperparathyroidism of renal origin: Secondary | ICD-10-CM | POA: Diagnosis not present

## 2020-10-21 DIAGNOSIS — E1122 Type 2 diabetes mellitus with diabetic chronic kidney disease: Secondary | ICD-10-CM | POA: Diagnosis not present

## 2020-10-21 DIAGNOSIS — I5042 Chronic combined systolic (congestive) and diastolic (congestive) heart failure: Secondary | ICD-10-CM | POA: Diagnosis not present

## 2020-10-23 ENCOUNTER — Telehealth: Payer: Self-pay | Admitting: Nurse Practitioner

## 2020-10-23 NOTE — Telephone Encounter (Signed)
Left message with moved appointment due to provider's template. Gave option to call back to reschedule if needed. 

## 2020-10-24 ENCOUNTER — Other Ambulatory Visit: Payer: Self-pay

## 2020-10-24 ENCOUNTER — Ambulatory Visit (HOSPITAL_COMMUNITY)
Admission: RE | Admit: 2020-10-24 | Discharge: 2020-10-24 | Disposition: A | Discharge status: Home / Self Care | Payer: Medicare Other | Source: Ambulatory Visit | Attending: Nephrology | Admitting: Nephrology

## 2020-10-24 VITALS — BP 153/74 | HR 69 | Temp 97.8°F | Resp 18

## 2020-10-24 DIAGNOSIS — N183 Chronic kidney disease, stage 3 unspecified: Secondary | ICD-10-CM

## 2020-10-24 DIAGNOSIS — D631 Anemia in chronic kidney disease: Secondary | ICD-10-CM | POA: Insufficient documentation

## 2020-10-24 LAB — COMPREHENSIVE METABOLIC PANEL
ALT: 8 U/L (ref 0–44)
AST: 7 U/L — ABNORMAL LOW (ref 15–41)
Albumin: 3.1 g/dL — ABNORMAL LOW (ref 3.5–5.0)
Alkaline Phosphatase: 51 U/L (ref 38–126)
Anion gap: 11 (ref 5–15)
BUN: 101 mg/dL — ABNORMAL HIGH (ref 8–23)
CO2: 14 mmol/L — ABNORMAL LOW (ref 22–32)
Calcium: 9.7 mg/dL (ref 8.9–10.3)
Chloride: 116 mmol/L — ABNORMAL HIGH (ref 98–111)
Creatinine, Ser: 9.71 mg/dL — ABNORMAL HIGH (ref 0.61–1.24)
GFR, Estimated: 5 mL/min — ABNORMAL LOW (ref >60)
Glucose, Bld: 112 mg/dL — ABNORMAL HIGH (ref 70–99)
Potassium: 5 mmol/L (ref 3.5–5.1)
Sodium: 141 mmol/L (ref 135–145)
Total Bilirubin: 0.5 mg/dL (ref 0.3–1.2)
Total Protein: 7 g/dL (ref 6.5–8.1)

## 2020-10-24 LAB — PHOSPHORUS: Phosphorus: 6.2 mg/dL — ABNORMAL HIGH (ref 2.5–4.6)

## 2020-10-24 LAB — FERRITIN: Ferritin: 160 ng/mL (ref 24–336)

## 2020-10-24 LAB — IRON AND TIBC
Iron: 60 ug/dL (ref 45–182)
Saturation Ratios: 32 % (ref 17.9–39.5)
TIBC: 190 ug/dL — ABNORMAL LOW (ref 250–450)
UIBC: 130 ug/dL

## 2020-10-24 LAB — POCT HEMOGLOBIN-HEMACUE: Hemoglobin: 8.7 g/dL — ABNORMAL LOW (ref 13.0–17.0)

## 2020-10-24 MED ORDER — EPOETIN ALFA-EPBX 10000 UNIT/ML IJ SOLN
20000.0000 [IU] | INTRAMUSCULAR | Status: DC
  Administered 2020-10-24: 20000 [IU] via SUBCUTANEOUS

## 2020-10-24 MED ORDER — EPOETIN ALFA-EPBX 10000 UNIT/ML IJ SOLN
INTRAMUSCULAR | Status: AC
  Filled 2020-10-24: qty 2

## 2020-10-27 ENCOUNTER — Ambulatory Visit (INDEPENDENT_AMBULATORY_CARE_PROVIDER_SITE_OTHER): Payer: Medicare Other | Admitting: Physician Assistant

## 2020-10-27 ENCOUNTER — Other Ambulatory Visit: Payer: Self-pay

## 2020-10-27 VITALS — BP 148/68 | HR 70 | Temp 98.7°F | Resp 20 | Ht 74.0 in | Wt 260.7 lb

## 2020-10-27 DIAGNOSIS — N185 Chronic kidney disease, stage 5: Secondary | ICD-10-CM

## 2020-10-27 NOTE — Progress Notes (Signed)
POST OPERATIVE DIALYSIS ACCESS OFFICE NOTE    CC:  F/u for dialysis access surgery  HPI:  This is a 70 y.o. male who is s/p 2nd stage left basilic vein fistula on 01/14/6159 by Dr. Trula Slade. His 1st stage was performed on 07/16/2020.  He is not yet on HD.  CKD is managed by Dr. Marval Regal. He reports left shoulder pain from arthritis and chronic, bilateral 4th and 5th digit tingling. No hand pain.  No Known Allergies  Current Outpatient Medications  Medication Sig Dispense Refill  . acetaminophen (TYLENOL) 500 MG tablet Take 500 mg by mouth every 6 (six) hours as needed for moderate pain or headache.    Marland Kitchen amLODipine (NORVASC) 10 MG tablet Take 1 tablet (10 mg total) by mouth daily. (Patient taking differently: Take 10 mg by mouth in the morning and at bedtime.) 30 tablet 6  . aspirin EC 81 MG tablet Take 1 tablet (81 mg total) by mouth daily. 30 tablet 5  . atorvastatin (LIPITOR) 20 MG tablet TAKE 1 TABLET BY MOUTH  DAILY (Patient taking differently: Take 20 mg by mouth daily.) 90 tablet 0  . Blood Glucose Monitoring Suppl (ONETOUCH VERIO) w/Device KIT Use as directed to test blood sugar three times daily. 1 kit 0  . calcitRIOL (ROCALTROL) 0.5 MCG capsule Take 0.5 mcg by mouth daily.    . carvedilol (COREG) 25 MG tablet Take 1 tablet (25 mg total) by mouth 2 (two) times daily with a meal. 60 tablet 6  . cholecalciferol (VITAMIN D3) 25 MCG (1000 UNIT) tablet Take 1,000 Units by mouth daily.    Marland Kitchen docusate sodium (COLACE) 100 MG capsule Take 100 mg by mouth daily as needed for mild constipation.    . ferrous sulfate 325 (65 FE) MG tablet Take 325 mg by mouth 2 (two) times daily.    . furosemide (LASIX) 80 MG tablet Take 120 mg by mouth daily.    Marland Kitchen gabapentin (NEURONTIN) 100 MG capsule Take 1 capsule (100 mg total) by mouth at bedtime. (Patient not taking: Reported on 09/18/2020) 30 capsule 3  . glucose blood (ONETOUCH VERIO) test strip Use as directed to test blood sugar three times daily. 100  each 12  . hydrALAZINE (APRESOLINE) 25 MG tablet Take 1 tablet (25 mg total) by mouth 2 (two) times daily. 180 tablet 3  . HYDROcodone-acetaminophen (NORCO/VICODIN) 5-325 MG tablet Take 1 tablet by mouth every 4 (four) hours as needed for moderate pain. 20 tablet 0  . Insulin Glargine (LANTUS SOLOSTAR) 100 UNIT/ML Solostar Pen Inject 10 Units into the skin at bedtime. (Patient taking differently: Inject 10 Units into the skin at bedtime as needed (bs >200).) 15 mL 0  . Insulin Pen Needle (TRUEPLUS PEN NEEDLES) 32G X 4 MM MISC Use as directed to administer lantus daily 100 each 1  . Insulin Syringe-Needle U-100 (INSULIN SYRINGE .5CC/30GX5/16") 30G X 5/16" 0.5 ML MISC Check blood sugar TID & QHS 100 each 2  . Multiple Vitamin (MULTIVITAMIN WITH MINERALS) TABS tablet Take 1 tablet by mouth daily.    [name] Delica Lancets 73X MISC Use as directed to test blood sugar three times daily. 100 each 12  . sodium bicarbonate 650 MG tablet Take 1,950 mg by mouth 2 (two) times daily.      No current facility-administered medications for this visit.   Facility-Administered Medications Ordered in Other Visits  Medication Dose Route Frequency Provider Last Rate Last Admin  . vancomycin (VANCOREADY) IVPB 1500 mg/300 mL  1,500  mg Intravenous Once Angelia Mould, MD         ROS:  See HPI Vitals:   10/27/20 0847  BP: (!) 148/68  Pulse: 70  Resp: 20  Temp: 98.7 F (37.1 C)  SpO2: 99%    Physical Exam:  General appearance: WD, WN in NAD Cardiac:  RRR Respiratory: non-labored Incision:  Left upper arm incisions healing without signs of infection Extremities:  There is mild swelling of the dependent left upper arm below each incision. See photo.These areas are soft without tenderness, erythema or increased warmth.  There is a good bruit and thrill in the fistula. 2+ radial pulse. 5/5 hand grip strength     Assessment/Plan:   -pt does not have evidence of steal syndrome -likely small  seromas of the left upper arm without signs of infection -follow-up in 2-3 weeks to ensure resolution of edema  Barbie Banner, PA-C 10/27/2020 8:42 AM [facility] [phone]  Clinic MD:  Trula Slade

## 2020-10-29 DIAGNOSIS — N185 Chronic kidney disease, stage 5: Secondary | ICD-10-CM | POA: Diagnosis not present

## 2020-11-13 DIAGNOSIS — I739 Peripheral vascular disease, unspecified: Secondary | ICD-10-CM | POA: Diagnosis not present

## 2020-11-13 DIAGNOSIS — L602 Onychogryphosis: Secondary | ICD-10-CM | POA: Diagnosis not present

## 2020-11-13 DIAGNOSIS — L84 Corns and callosities: Secondary | ICD-10-CM | POA: Diagnosis not present

## 2020-11-17 ENCOUNTER — Ambulatory Visit: Payer: Medicare Other

## 2020-11-17 DIAGNOSIS — I120 Hypertensive chronic kidney disease with stage 5 chronic kidney disease or end stage renal disease: Secondary | ICD-10-CM | POA: Diagnosis not present

## 2020-11-17 DIAGNOSIS — N185 Chronic kidney disease, stage 5: Secondary | ICD-10-CM | POA: Diagnosis not present

## 2020-11-17 DIAGNOSIS — E875 Hyperkalemia: Secondary | ICD-10-CM | POA: Diagnosis not present

## 2020-11-17 DIAGNOSIS — N189 Chronic kidney disease, unspecified: Secondary | ICD-10-CM | POA: Diagnosis not present

## 2020-11-17 DIAGNOSIS — E872 Acidosis: Secondary | ICD-10-CM | POA: Diagnosis not present

## 2020-11-17 DIAGNOSIS — R809 Proteinuria, unspecified: Secondary | ICD-10-CM | POA: Diagnosis not present

## 2020-11-17 DIAGNOSIS — E1122 Type 2 diabetes mellitus with diabetic chronic kidney disease: Secondary | ICD-10-CM | POA: Diagnosis not present

## 2020-11-17 DIAGNOSIS — D631 Anemia in chronic kidney disease: Secondary | ICD-10-CM | POA: Diagnosis not present

## 2020-11-17 DIAGNOSIS — N2581 Secondary hyperparathyroidism of renal origin: Secondary | ICD-10-CM | POA: Diagnosis not present

## 2020-11-21 ENCOUNTER — Other Ambulatory Visit: Payer: Self-pay

## 2020-11-21 ENCOUNTER — Ambulatory Visit (HOSPITAL_COMMUNITY)
Admission: RE | Admit: 2020-11-21 | Discharge: 2020-11-21 | Disposition: A | Discharge status: Home / Self Care | Payer: Medicare Other | Source: Ambulatory Visit | Attending: Nephrology | Admitting: Nephrology

## 2020-11-21 VITALS — BP 149/71 | HR 64 | Temp 97.7°F | Resp 18

## 2020-11-21 DIAGNOSIS — N183 Chronic kidney disease, stage 3 unspecified: Secondary | ICD-10-CM

## 2020-11-21 LAB — POCT HEMOGLOBIN-HEMACUE: Hemoglobin: 7.9 g/dL — ABNORMAL LOW (ref 13.0–17.0)

## 2020-11-21 MED ORDER — EPOETIN ALFA-EPBX 10000 UNIT/ML IJ SOLN
INTRAMUSCULAR | Status: AC
  Administered 2020-11-21: 20000 [IU] via SUBCUTANEOUS
  Filled 2020-11-21: qty 2

## 2020-11-21 MED ORDER — EPOETIN ALFA-EPBX 10000 UNIT/ML IJ SOLN: 20000.0000 [IU] | INTRAMUSCULAR | Status: DC

## 2020-11-21 NOTE — Progress Notes (Signed)
Pt arrived at 1100 for Retacrit 20,000 unit injection. Hemocue 7.9. Pt denies seeing blood in toilet or emesis, no reports of vomiting. Called [facility], spoke with Reggie Pile regarding low hemocue. Called back at 1200 after speaking with MD. No new orders received. Pt verbalized understanding to call 911 or go to ER with worsening symptoms.

## 2020-11-24 ENCOUNTER — Ambulatory Visit: Payer: Medicare Other

## 2020-11-26 ENCOUNTER — Emergency Department (HOSPITAL_COMMUNITY): Payer: Medicare Other | Admitting: Anesthesiology

## 2020-11-26 ENCOUNTER — Encounter (HOSPITAL_COMMUNITY)
Admission: EM | Disposition: A | Discharge status: Home / Self Care | Payer: Self-pay | Source: Home / Self Care | Attending: Emergency Medicine

## 2020-11-26 ENCOUNTER — Observation Stay (HOSPITAL_COMMUNITY)
Admission: EM | Admit: 2020-11-26 | Discharge: 2020-11-27 | Disposition: A | Discharge status: Home / Self Care | Payer: Medicare Other | Attending: Surgery | Admitting: Surgery

## 2020-11-26 ENCOUNTER — Telehealth: Payer: Self-pay

## 2020-11-26 ENCOUNTER — Encounter (HOSPITAL_COMMUNITY): Payer: Self-pay

## 2020-11-26 DIAGNOSIS — T82590A Other mechanical complication of surgically created arteriovenous fistula, initial encounter: Secondary | ICD-10-CM | POA: Diagnosis not present

## 2020-11-26 DIAGNOSIS — T829XXA Unspecified complication of cardiac and vascular prosthetic device, implant and graft, initial encounter: Secondary | ICD-10-CM

## 2020-11-26 DIAGNOSIS — I97638 Postprocedural hematoma of a circulatory system organ or structure following other circulatory system procedure: Secondary | ICD-10-CM | POA: Diagnosis not present

## 2020-11-26 DIAGNOSIS — E1122 Type 2 diabetes mellitus with diabetic chronic kidney disease: Secondary | ICD-10-CM | POA: Diagnosis not present

## 2020-11-26 DIAGNOSIS — N186 End stage renal disease: Secondary | ICD-10-CM

## 2020-11-26 DIAGNOSIS — Z794 Long term (current) use of insulin: Secondary | ICD-10-CM | POA: Insufficient documentation

## 2020-11-26 DIAGNOSIS — I132 Hypertensive heart and chronic kidney disease with heart failure and with stage 5 chronic kidney disease, or end stage renal disease: Secondary | ICD-10-CM | POA: Insufficient documentation

## 2020-11-26 DIAGNOSIS — I10 Essential (primary) hypertension: Secondary | ICD-10-CM | POA: Diagnosis not present

## 2020-11-26 DIAGNOSIS — S40022A Contusion of left upper arm, initial encounter: Principal | ICD-10-CM | POA: Insufficient documentation

## 2020-11-26 DIAGNOSIS — Z992 Dependence on renal dialysis: Secondary | ICD-10-CM | POA: Diagnosis not present

## 2020-11-26 DIAGNOSIS — T82838A Hemorrhage of vascular prosthetic devices, implants and grafts, initial encounter: Secondary | ICD-10-CM | POA: Diagnosis present

## 2020-11-26 DIAGNOSIS — D631 Anemia in chronic kidney disease: Secondary | ICD-10-CM | POA: Diagnosis not present

## 2020-11-26 DIAGNOSIS — Z856 Personal history of leukemia: Secondary | ICD-10-CM | POA: Insufficient documentation

## 2020-11-26 DIAGNOSIS — Z743 Need for continuous supervision: Secondary | ICD-10-CM | POA: Diagnosis not present

## 2020-11-26 DIAGNOSIS — M7981 Nontraumatic hematoma of soft tissue: Secondary | ICD-10-CM | POA: Diagnosis not present

## 2020-11-26 DIAGNOSIS — I509 Heart failure, unspecified: Secondary | ICD-10-CM | POA: Insufficient documentation

## 2020-11-26 DIAGNOSIS — Z20822 Contact with and (suspected) exposure to covid-19: Secondary | ICD-10-CM | POA: Diagnosis not present

## 2020-11-26 DIAGNOSIS — I5032 Chronic diastolic (congestive) heart failure: Secondary | ICD-10-CM | POA: Diagnosis not present

## 2020-11-26 DIAGNOSIS — X58XXXA Exposure to other specified factors, initial encounter: Secondary | ICD-10-CM | POA: Diagnosis not present

## 2020-11-26 DIAGNOSIS — Z79899 Other long term (current) drug therapy: Secondary | ICD-10-CM | POA: Diagnosis not present

## 2020-11-26 DIAGNOSIS — Z7982 Long term (current) use of aspirin: Secondary | ICD-10-CM | POA: Diagnosis not present

## 2020-11-26 DIAGNOSIS — Z87891 Personal history of nicotine dependence: Secondary | ICD-10-CM | POA: Diagnosis not present

## 2020-11-26 DIAGNOSIS — N185 Chronic kidney disease, stage 5: Secondary | ICD-10-CM | POA: Diagnosis not present

## 2020-11-26 DIAGNOSIS — R58 Hemorrhage, not elsewhere classified: Secondary | ICD-10-CM | POA: Diagnosis not present

## 2020-11-26 HISTORY — PX: HEMATOMA EVACUATION: SHX5118

## 2020-11-26 LAB — CBC WITH DIFFERENTIAL/PLATELET
Abs Immature Granulocytes: 0.03 10*3/uL (ref 0.00–0.07)
Basophils Absolute: 0.1 10*3/uL (ref 0.0–0.1)
Basophils Relative: 1 %
Eosinophils Absolute: 0.3 10*3/uL (ref 0.0–0.5)
Eosinophils Relative: 4 %
HCT: 25.8 % — ABNORMAL LOW (ref 39.0–52.0)
Hemoglobin: 8 g/dL — ABNORMAL LOW (ref 13.0–17.0)
Immature Granulocytes: 0 %
Lymphocytes Relative: 29 %
Lymphs Abs: 2.5 10*3/uL (ref 0.7–4.0)
MCH: 30.4 pg (ref 26.0–34.0)
MCHC: 31 g/dL (ref 30.0–36.0)
MCV: 98.1 fL (ref 80.0–100.0)
Monocytes Absolute: 0.9 10*3/uL (ref 0.1–1.0)
Monocytes Relative: 10 %
Neutro Abs: 4.8 10*3/uL (ref 1.7–7.7)
Neutrophils Relative %: 56 %
Platelets: 373 10*3/uL (ref 150–400)
RBC: 2.63 MIL/uL — ABNORMAL LOW (ref 4.22–5.81)
RDW: 15.5 % (ref 11.5–15.5)
WBC: 8.7 10*3/uL (ref 4.0–10.5)
nRBC: 2.1 % — ABNORMAL HIGH (ref 0.0–0.2)

## 2020-11-26 LAB — COMPREHENSIVE METABOLIC PANEL
ALT: 8 U/L (ref 0–44)
AST: 5 U/L — ABNORMAL LOW (ref 15–41)
Albumin: 3.2 g/dL — ABNORMAL LOW (ref 3.5–5.0)
Alkaline Phosphatase: 42 U/L (ref 38–126)
Anion gap: 8 (ref 5–15)
BUN: 83 mg/dL — ABNORMAL HIGH (ref 8–23)
CO2: 13 mmol/L — ABNORMAL LOW (ref 22–32)
Calcium: 8.5 mg/dL — ABNORMAL LOW (ref 8.9–10.3)
Chloride: 122 mmol/L — ABNORMAL HIGH (ref 98–111)
Creatinine, Ser: 10.5 mg/dL — ABNORMAL HIGH (ref 0.61–1.24)
GFR, Estimated: 5 mL/min — ABNORMAL LOW (ref >60)
Glucose, Bld: 101 mg/dL — ABNORMAL HIGH (ref 70–99)
Potassium: 5.1 mmol/L (ref 3.5–5.1)
Sodium: 143 mmol/L (ref 135–145)
Total Bilirubin: 0.7 mg/dL (ref 0.3–1.2)
Total Protein: 6.8 g/dL (ref 6.5–8.1)

## 2020-11-26 LAB — BASIC METABOLIC PANEL
Anion gap: 12 (ref 5–15)
BUN: 86 mg/dL — ABNORMAL HIGH (ref 8–23)
CO2: 13 mmol/L — ABNORMAL LOW (ref 22–32)
Calcium: 9.1 mg/dL (ref 8.9–10.3)
Chloride: 118 mmol/L — ABNORMAL HIGH (ref 98–111)
Creatinine, Ser: 10.73 mg/dL — ABNORMAL HIGH (ref 0.61–1.24)
GFR, Estimated: 5 mL/min — ABNORMAL LOW (ref >60)
Glucose, Bld: 162 mg/dL — ABNORMAL HIGH (ref 70–99)
Potassium: 4.9 mmol/L (ref 3.5–5.1)
Sodium: 143 mmol/L (ref 135–145)

## 2020-11-26 LAB — RESP PANEL BY RT-PCR (FLU A&B, COVID) ARPGX2
Influenza A by PCR: NEGATIVE
Influenza B by PCR: NEGATIVE
SARS Coronavirus 2 by RT PCR: NEGATIVE

## 2020-11-26 LAB — GLUCOSE, CAPILLARY
Glucose-Capillary: 95 mg/dL (ref 70–99)
Glucose-Capillary: 92 mg/dL (ref 70–99)

## 2020-11-26 LAB — POCT I-STAT, CHEM 8
BUN: 94 mg/dL — ABNORMAL HIGH (ref 8–23)
Calcium, Ion: 1.35 mmol/L (ref 1.15–1.40)
Chloride: 122 mmol/L — ABNORMAL HIGH (ref 98–111)
Creatinine, Ser: 12.3 mg/dL — ABNORMAL HIGH (ref 0.61–1.24)
Glucose, Bld: 96 mg/dL (ref 70–99)
HCT: 23 % — ABNORMAL LOW (ref 39.0–52.0)
Hemoglobin: 7.8 g/dL — ABNORMAL LOW (ref 13.0–17.0)
Potassium: 5 mmol/L (ref 3.5–5.1)
Sodium: 148 mmol/L — ABNORMAL HIGH (ref 135–145)
TCO2: 16 mmol/L — ABNORMAL LOW (ref 22–32)

## 2020-11-26 LAB — PROTIME-INR
INR: 1.2 (ref 0.8–1.2)
Prothrombin Time: 14.8 seconds (ref 11.4–15.2)

## 2020-11-26 LAB — CBC
HCT: 23.3 % — ABNORMAL LOW (ref 39.0–52.0)
Hemoglobin: 7.4 g/dL — ABNORMAL LOW (ref 13.0–17.0)
MCH: 31.4 pg (ref 26.0–34.0)
MCHC: 31.8 g/dL (ref 30.0–36.0)
MCV: 98.7 fL (ref 80.0–100.0)
Platelets: 350 10*3/uL (ref 150–400)
RBC: 2.36 MIL/uL — ABNORMAL LOW (ref 4.22–5.81)
RDW: 15.5 % (ref 11.5–15.5)
WBC: 9.9 10*3/uL (ref 4.0–10.5)
nRBC: 2.6 % — ABNORMAL HIGH (ref 0.0–0.2)

## 2020-11-26 LAB — HEMOGLOBIN A1C
Hgb A1c MFr Bld: 6.6 % — ABNORMAL HIGH (ref 4.8–5.6)
Mean Plasma Glucose: 142.72 mg/dL

## 2020-11-26 LAB — PREPARE RBC (CROSSMATCH)

## 2020-11-26 SURGERY — EVACUATION HEMATOMA
Anesthesia: General | Site: Arm Upper | Laterality: Left

## 2020-11-26 MED ORDER — PANTOPRAZOLE SODIUM 40 MG PO TBEC
40.0000 mg | DELAYED_RELEASE_TABLET | Freq: Every day | ORAL | Status: DC
  Administered 2020-11-27: 40 mg via ORAL
  Filled 2020-11-26: qty 1

## 2020-11-26 MED ORDER — AMLODIPINE BESYLATE 10 MG PO TABS
10.0000 mg | ORAL_TABLET | Freq: Every day | ORAL | Status: DC
  Administered 2020-11-27: 10 mg via ORAL
  Filled 2020-11-26: qty 1

## 2020-11-26 MED ORDER — SODIUM CHLORIDE 0.9 % IR SOLN
Status: DC | PRN
  Administered 2020-11-26: 1000 mL

## 2020-11-26 MED ORDER — PROPOFOL 10 MG/ML IV BOLUS
INTRAVENOUS | Status: DC | PRN
  Administered 2020-11-26: 160 mg via INTRAVENOUS

## 2020-11-26 MED ORDER — PHENOL 1.4 % MT LIQD: 1.0000 | OROMUCOSAL | Status: DC | PRN

## 2020-11-26 MED ORDER — DOCUSATE SODIUM 100 MG PO CAPS
100.0000 mg | ORAL_CAPSULE | Freq: Two times a day (BID) | ORAL | Status: DC
  Administered 2020-11-26 – 2020-11-27 (×2): 100 mg via ORAL
  Filled 2020-11-26 (×2): qty 1

## 2020-11-26 MED ORDER — FENTANYL CITRATE (PF) 100 MCG/2ML IJ SOLN
INTRAMUSCULAR | Status: AC
  Filled 2020-11-26: qty 2

## 2020-11-26 MED ORDER — ACETAMINOPHEN 325 MG PO TABS: 325.0000 mg | ORAL_TABLET | ORAL | Status: DC | PRN

## 2020-11-26 MED ORDER — SODIUM CHLORIDE 0.9 % IV SOLN
INTRAVENOUS | Status: DC | PRN
  Administered 2020-11-26: 500 mL

## 2020-11-26 MED ORDER — SENNOSIDES-DOCUSATE SODIUM 8.6-50 MG PO TABS
1.0000 | ORAL_TABLET | Freq: Every evening | ORAL | Status: DC | PRN
Start: 2020-11-26 — End: 2020-11-27

## 2020-11-26 MED ORDER — SODIUM CHLORIDE 0.9% IV SOLUTION: Freq: Once | INTRAVENOUS | Status: DC

## 2020-11-26 MED ORDER — ONDANSETRON HCL 4 MG/2ML IJ SOLN: 4.0000 mg | Freq: Four times a day (QID) | INTRAMUSCULAR | Status: DC | PRN

## 2020-11-26 MED ORDER — ALBUMIN HUMAN 5 % IV SOLN: INTRAVENOUS | Status: DC | PRN

## 2020-11-26 MED ORDER — MIDAZOLAM HCL 5 MG/5ML IJ SOLN
INTRAMUSCULAR | Status: DC | PRN
  Administered 2020-11-26: 1 mg via INTRAVENOUS

## 2020-11-26 MED ORDER — FENTANYL CITRATE (PF) 250 MCG/5ML IJ SOLN
INTRAMUSCULAR | Status: DC | PRN
  Administered 2020-11-26 (×4): 50 ug via INTRAVENOUS

## 2020-11-26 MED ORDER — PROPOFOL 10 MG/ML IV BOLUS
INTRAVENOUS | Status: AC
  Filled 2020-11-26: qty 20

## 2020-11-26 MED ORDER — FUROSEMIDE 40 MG PO TABS
120.0000 mg | ORAL_TABLET | Freq: Every day | ORAL | Status: DC
  Administered 2020-11-27: 120 mg via ORAL
  Filled 2020-11-26: qty 1

## 2020-11-26 MED ORDER — ROCURONIUM BROMIDE 100 MG/10ML IV SOLN
INTRAVENOUS | Status: DC | PRN
  Administered 2020-11-26: 30 mg via INTRAVENOUS

## 2020-11-26 MED ORDER — FERROUS SULFATE 325 (65 FE) MG PO TABS
325.0000 mg | ORAL_TABLET | Freq: Two times a day (BID) | ORAL | Status: DC
  Administered 2020-11-26 – 2020-11-27 (×2): 325 mg via ORAL
  Filled 2020-11-26 (×2): qty 1

## 2020-11-26 MED ORDER — PATIROMER SORBITEX CALCIUM 8.4 G PO PACK
1.0000 | PACK | Freq: Every day | ORAL | Status: DC
  Administered 2020-11-27: 1 via ORAL
  Filled 2020-11-26: qty 1

## 2020-11-26 MED ORDER — SODIUM CHLORIDE 0.9 % IV SOLN: INTRAVENOUS | Status: DC

## 2020-11-26 MED ORDER — VANCOMYCIN HCL 1500 MG/300ML IV SOLN
1500.0000 mg | Freq: Once | INTRAVENOUS | Status: AC
  Administered 2020-11-26: 1500 mg via INTRAVENOUS
  Filled 2020-11-26 (×3): qty 300

## 2020-11-26 MED ORDER — SODIUM CHLORIDE 0.9% FLUSH: 3.0000 mL | INTRAVENOUS | Status: DC | PRN

## 2020-11-26 MED ORDER — INSULIN ASPART 100 UNIT/ML ~~LOC~~ SOLN
0.0000 [IU] | Freq: Three times a day (TID) | SUBCUTANEOUS | Status: DC
  Administered 2020-11-27 (×2): 1 [IU] via SUBCUTANEOUS

## 2020-11-26 MED ORDER — CHLORHEXIDINE GLUCONATE 0.12 % MT SOLN
15.0000 mL | OROMUCOSAL | Status: AC
  Administered 2020-11-26: 15 mL via OROMUCOSAL
  Filled 2020-11-26: qty 15

## 2020-11-26 MED ORDER — FENTANYL CITRATE (PF) 100 MCG/2ML IJ SOLN
25.0000 ug | INTRAMUSCULAR | Status: DC | PRN
Start: 2020-11-26 — End: 2020-11-26
  Administered 2020-11-26: 50 ug via INTRAVENOUS
  Administered 2020-11-26: 25 ug via INTRAVENOUS
  Administered 2020-11-26: 50 ug via INTRAVENOUS
  Administered 2020-11-26: 25 ug via INTRAVENOUS

## 2020-11-26 MED ORDER — ONDANSETRON HCL 4 MG/2ML IJ SOLN
INTRAMUSCULAR | Status: DC | PRN
  Administered 2020-11-26: 4 mg via INTRAVENOUS

## 2020-11-26 MED ORDER — HYDRALAZINE HCL 20 MG/ML IJ SOLN: 5.0000 mg | INTRAMUSCULAR | Status: DC | PRN

## 2020-11-26 MED ORDER — ALUM & MAG HYDROXIDE-SIMETH 200-200-20 MG/5ML PO SUSP: 15.0000 mL | ORAL | Status: DC | PRN

## 2020-11-26 MED ORDER — METOPROLOL TARTRATE 5 MG/5ML IV SOLN: 2.0000 mg | INTRAVENOUS | Status: DC | PRN

## 2020-11-26 MED ORDER — SODIUM CHLORIDE 0.9 % IV SOLN: 250.0000 mL | INTRAVENOUS | Status: DC | PRN

## 2020-11-26 MED ORDER — ACETAMINOPHEN 650 MG RE SUPP: 325.0000 mg | RECTAL | Status: DC | PRN

## 2020-11-26 MED ORDER — MIDAZOLAM HCL 2 MG/2ML IJ SOLN
INTRAMUSCULAR | Status: AC
  Filled 2020-11-26: qty 2

## 2020-11-26 MED ORDER — GUAIFENESIN-DM 100-10 MG/5ML PO SYRP: 15.0000 mL | ORAL_SOLUTION | ORAL | Status: DC | PRN

## 2020-11-26 MED ORDER — CARVEDILOL 25 MG PO TABS
25.0000 mg | ORAL_TABLET | Freq: Two times a day (BID) | ORAL | Status: DC
  Administered 2020-11-27: 25 mg via ORAL
  Filled 2020-11-26: qty 1

## 2020-11-26 MED ORDER — LIDOCAINE HCL (CARDIAC) PF 100 MG/5ML IV SOSY
PREFILLED_SYRINGE | INTRAVENOUS | Status: DC | PRN
  Administered 2020-11-26: 40 mg via INTRATRACHEAL

## 2020-11-26 MED ORDER — SODIUM CHLORIDE 0.9 % IV SOLN: INTRAVENOUS | Status: DC | PRN

## 2020-11-26 MED ORDER — DOCUSATE SODIUM 100 MG PO CAPS: 100.0000 mg | ORAL_CAPSULE | Freq: Every day | ORAL | Status: DC | PRN

## 2020-11-26 MED ORDER — FENTANYL CITRATE (PF) 250 MCG/5ML IJ SOLN
INTRAMUSCULAR | Status: AC
  Filled 2020-11-26: qty 5

## 2020-11-26 MED ORDER — SUCCINYLCHOLINE CHLORIDE 20 MG/ML IJ SOLN
INTRAMUSCULAR | Status: DC | PRN
  Administered 2020-11-26: 120 mg via INTRAVENOUS

## 2020-11-26 MED ORDER — HYDRALAZINE HCL 25 MG PO TABS
25.0000 mg | ORAL_TABLET | Freq: Two times a day (BID) | ORAL | Status: DC
  Administered 2020-11-26 – 2020-11-27 (×2): 25 mg via ORAL
  Filled 2020-11-26 (×2): qty 1

## 2020-11-26 MED ORDER — SODIUM CHLORIDE 0.9% FLUSH
3.0000 mL | Freq: Two times a day (BID) | INTRAVENOUS | Status: DC
  Administered 2020-11-27 (×2): 3 mL via INTRAVENOUS

## 2020-11-26 MED ORDER — LABETALOL HCL 5 MG/ML IV SOLN: 10.0000 mg | INTRAVENOUS | Status: DC | PRN

## 2020-11-26 MED ORDER — HEPARIN SODIUM (PORCINE) 5000 UNIT/ML IJ SOLN
5000.0000 [IU] | Freq: Three times a day (TID) | INTRAMUSCULAR | Status: DC
  Administered 2020-11-27: 5000 [IU] via SUBCUTANEOUS
  Filled 2020-11-26: qty 1

## 2020-11-26 MED ORDER — OXYCODONE-ACETAMINOPHEN 5-325 MG PO TABS: 1.0000 | ORAL_TABLET | ORAL | Status: DC | PRN

## 2020-11-26 MED ORDER — CHLORHEXIDINE GLUCONATE 0.12 % MT SOLN
OROMUCOSAL | Status: AC
  Filled 2020-11-26: qty 15

## 2020-11-26 MED ORDER — MORPHINE SULFATE (PF) 2 MG/ML IV SOLN: 2.0000 mg | INTRAVENOUS | Status: DC | PRN

## 2020-11-26 MED ORDER — CEFAZOLIN SODIUM-DEXTROSE 1-4 GM/50ML-% IV SOLN
1.0000 g | INTRAVENOUS | Status: DC
  Administered 2020-11-26: 1 g via INTRAVENOUS
  Filled 2020-11-26 (×2): qty 50

## 2020-11-26 MED ORDER — SUGAMMADEX SODIUM 200 MG/2ML IV SOLN
INTRAVENOUS | Status: DC | PRN
  Administered 2020-11-26: 200 mg via INTRAVENOUS

## 2020-11-26 MED ORDER — POTASSIUM CHLORIDE CRYS ER 20 MEQ PO TBCR: 20.0000 meq | EXTENDED_RELEASE_TABLET | Freq: Once | ORAL | Status: DC

## 2020-11-26 SURGICAL SUPPLY — 44 items
BANDAGE ESMARK 6X9 LF (GAUZE/BANDAGES/DRESSINGS) IMPLANT
BNDG CMPR 9X6 STRL LF SNTH (GAUZE/BANDAGES/DRESSINGS)
BNDG ESMARK 6X9 LF (GAUZE/BANDAGES/DRESSINGS)
CANISTER SUCT 3000ML PPV (MISCELLANEOUS) ×2 IMPLANT
COVER WAND RF STERILE (DRAPES) ×2 IMPLANT
CUFF TOURN SGL QUICK 18X4 (TOURNIQUET CUFF) IMPLANT
CUFF TOURN SGL QUICK 24 (TOURNIQUET CUFF)
CUFF TOURN SGL QUICK 34 (TOURNIQUET CUFF)
CUFF TOURN SGL QUICK 42 (TOURNIQUET CUFF) IMPLANT
CUFF TRNQT CYL 24X4X16.5-23 (TOURNIQUET CUFF) IMPLANT
CUFF TRNQT CYL 34X4.125X (TOURNIQUET CUFF) IMPLANT
DRAIN CHANNEL 15F RND FF W/TCR (WOUND CARE) IMPLANT
ELECT REM PT RETURN 9FT ADLT (ELECTROSURGICAL) ×2
ELECTRODE REM PT RTRN 9FT ADLT (ELECTROSURGICAL) ×1 IMPLANT
EVACUATOR SILICONE 100CC (DRAIN) ×1 IMPLANT
GLOVE BIOGEL PI IND STRL 7.5 (GLOVE) ×1 IMPLANT
GLOVE BIOGEL PI INDICATOR 7.5 (GLOVE) ×1
GLOVE SURG SS PI 7.5 STRL IVOR (GLOVE) ×2 IMPLANT
GOWN STRL REUS W/ TWL LRG LVL3 (GOWN DISPOSABLE) ×1 IMPLANT
GOWN STRL REUS W/ TWL XL LVL3 (GOWN DISPOSABLE) ×3 IMPLANT
GOWN STRL REUS W/TWL LRG LVL3 (GOWN DISPOSABLE) ×2
GOWN STRL REUS W/TWL XL LVL3 (GOWN DISPOSABLE) ×6
KIT BASIN OR (CUSTOM PROCEDURE TRAY) ×2 IMPLANT
KIT TURNOVER KIT B (KITS) ×2 IMPLANT
NS IRRIG 1000ML POUR BTL (IV SOLUTION) ×4 IMPLANT
PACK CV ACCESS (CUSTOM PROCEDURE TRAY) ×1 IMPLANT
PACK GENERAL/GYN (CUSTOM PROCEDURE TRAY) IMPLANT
PACK PERIPHERAL VASCULAR (CUSTOM PROCEDURE TRAY) IMPLANT
PACK UNIVERSAL I (CUSTOM PROCEDURE TRAY) IMPLANT
PAD ARMBOARD 7.5X6 YLW CONV (MISCELLANEOUS) ×4 IMPLANT
STAPLER VISISTAT 35W (STAPLE) IMPLANT
SUT ETHILON 3 0 PS 1 (SUTURE) ×3 IMPLANT
SUT MNCRL AB 4-0 PS2 18 (SUTURE) IMPLANT
SUT PROLENE 5 0 C 1 24 (SUTURE) IMPLANT
SUT PROLENE 6 0 BV (SUTURE) IMPLANT
SUT VIC AB 2-0 CT1 27 (SUTURE) ×4
SUT VIC AB 2-0 CT1 TAPERPNT 27 (SUTURE) IMPLANT
SUT VIC AB 3-0 SH 27 (SUTURE) ×2
SUT VIC AB 3-0 SH 27X BRD (SUTURE) IMPLANT
SUT VIC AB 4-0 PS2 27 (SUTURE) ×1 IMPLANT
TOWEL GREEN STERILE (TOWEL DISPOSABLE) ×2 IMPLANT
TRAY FOLEY MTR SLVR 16FR STAT (SET/KITS/TRAYS/PACK) IMPLANT
UNDERPAD 30X36 HEAVY ABSORB (UNDERPADS AND DIAPERS) ×2 IMPLANT
WATER STERILE IRR 1000ML POUR (IV SOLUTION) ×2 IMPLANT

## 2020-11-26 NOTE — Anesthesia Preprocedure Evaluation (Addendum)
Anesthesia Evaluation  Patient identified by MRN, date of birth, ID band Patient awake    Airway Mallampati: II  TM Distance: >3 FB     Dental   Pulmonary pneumonia, former smoker,    breath sounds clear to auscultation       Cardiovascular hypertension, + Peripheral Vascular Disease and +CHF   Rhythm:Regular Rate:Normal     Neuro/Psych Seizures -,   Neuromuscular disease    GI/Hepatic GERD  ,  Endo/Other  diabetes  Renal/GU Renal disease     Musculoskeletal  (+) Arthritis ,   Abdominal   Peds  Hematology  (+) anemia ,   Anesthesia Other Findings   Reproductive/Obstetrics                            Anesthesia Physical Anesthesia Plan  ASA: III  Anesthesia Plan: General   Post-op Pain Management:    Induction: Intravenous  PONV Risk Score and Plan: 2 and Ondansetron and Propofol infusion  Airway Management Planned: Oral ETT  Additional Equipment:   Intra-op Plan:   Post-operative Plan: Post-operative intubation/ventilation  Informed Consent: I have reviewed the patients History and Physical, chart, labs and discussed the procedure including the risks, benefits and alternatives for the proposed anesthesia with the patient or authorized representative who has indicated his/her understanding and acceptance.     Dental advisory given  Plan Discussed with: CRNA and Anesthesiologist  Anesthesia Plan Comments:         Anesthesia Quick Evaluation

## 2020-11-26 NOTE — Progress Notes (Signed)
Pt has order for 2 units PRBCs to be transfused. Pts Hgb 7.8. Dr. Trula Slade paged to clarify order. Dr. Trula Slade stated to only give 1 unit PRBC and that the necessity of a second transfusion will be decided after morning labs.

## 2020-11-26 NOTE — Anesthesia Procedure Notes (Signed)
Procedure Name: Intubation Date/Time: 11/26/2020 8:47 PM Performed by: Clovis Cao, CRNA Pre-anesthesia Checklist: Patient identified, Emergency Drugs available, Suction available, Patient being monitored and Timeout performed Patient Re-evaluated:Patient Re-evaluated prior to induction Oxygen Delivery Method: Circle system utilized Preoxygenation: Pre-oxygenation with 100% oxygen Induction Type: IV induction, Rapid sequence and Cricoid Pressure applied Laryngoscope Size: Miller and 2 Grade View: Grade I Tube type: Oral Tube size: 7.5 mm Number of attempts: 1 Airway Equipment and Method: Stylet Placement Confirmation: ETT inserted through vocal cords under direct vision,  positive ETCO2 and breath sounds checked- equal and bilateral Secured at: 22 cm Tube secured with: Tape Dental Injury: Teeth and Oropharynx as per pre-operative assessment

## 2020-11-26 NOTE — Transfer of Care (Signed)
Immediate Anesthesia Transfer of Care Note  Patient: Mark [name]  Procedure(s) Performed: EVACUATION HEMATOMA LEFT ARM (Left Arm Upper)  Patient Location: PACU  Anesthesia Type:General  Level of Consciousness: drowsy  Airway & Oxygen Therapy: Patient Spontanous Breathing  Post-op Assessment: Report given to RN and Post -op Vital signs reviewed and stable  Post vital signs: Reviewed and stable  Last Vitals:  Vitals Value Taken Time  BP 83/73 11/26/20 2136  Temp 36.7 C 11/26/20 2130  Pulse 79 11/26/20 2136  Resp 19 11/26/20 2136  SpO2 98 % 11/26/20 2136  Vitals shown include unvalidated device data.  Last Pain:  Vitals:   11/26/20 1800  TempSrc: Oral  PainSc: 2          Complications: No complications documented.

## 2020-11-26 NOTE — Telephone Encounter (Signed)
Patient is s/p second stage BVT 09/26/20. Says he woke up today and shirt was wet with blood - changed shirts and bled through that as well. Says he thinks the blood is oozing. Instructed patient to hold pressure and he said he couldn't use his right arm. Advised patient to call EMS to come check on bleeding.
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
## 2020-11-26 NOTE — Progress Notes (Signed)
Pt arrived to 4E26 from PACU. CHG bath completed and new gown applied. Tele applied and CCMD notified. VS stable. Pt oriented to room, bed and call bell. Pt resting comfortably at this time. Will continue to monitor. Adella Hare, RN

## 2020-11-26 NOTE — Progress Notes (Signed)
PHARMACY NOTE:  ANTIMICROBIAL RENAL DOSAGE ADJUSTMENT  Current antimicrobial regimen includes a mismatch between antimicrobial dosage and estimated renal function.  As per policy approved by the Pharmacy & Therapeutics and Medical Executive Committees, the antimicrobial dosage will be adjusted accordingly.  Current antimicrobial dosage:  Cefazolin 2gm IV Q8h x 5 doses  Indication: post-op prophylaxis and wound infection  Renal Function:  Pt awaiting HD. SCr  12.3   Antimicrobial dosage has been changed to:  Cefazolin 1gm IV q24h x 2 doses   Thank you for allowing pharmacy to be a part of this patient's care.  Sherlon Handing, PharmD, BCPS Please see amion for complete clinical pharmacist phone list 11/26/2020 10:47 PM

## 2020-11-26 NOTE — Op Note (Signed)
    Patient name: Mark [name] MRN: [id number] DOB: [birth year] Sex: male  11/26/2020 Pre-operative Diagnosis: Left arm hematoma Post-operative diagnosis:  Same Surgeon:  [name] Assistants:  Ivin Booty, PA Procedure:   Evacuation of hematoma in the left arm Anesthesia: General Blood Loss: 50 cc Specimens: Anaerobic and aerobic cultures were sent  Findings: Large hematoma was present in the left upper arm which was completely evacuated.  No obvious source of bleeding was identified.  There is a palpable thrill within the fistula.  Indications: The patient underwent a second stage left basilic vein fistula creation at the end of January.  This morning he woke up with a large hematoma.  There were no obvious causes.  I discussed proceeding with hematoma evacuation due to the size.  Procedure:  The patient was identified in the holding area and taken to [place] 16  The patient was then placed supine on the table. general anesthesia was administered.  The patient was prepped and draped in the usual sterile fashion.  A time out was called and antibiotics were administered.  A PA was necessary to expedite the procedure and assist with technical details.  I opened the previous basilic vein incision in the upper arm with a 10 blade.  I sent cultures of the fluid.  Once the incision was open a large amount of acute thrombus was evacuated.  Once all of the thrombus was evacuated, I carefully inspected the wound to look for the source of bleeding.  No identifiable source was found.  The fistula was fully incorporated.  I observed the wound for greater than 30 minutes to see if I could induce a source of bleeding.  Nothing was obvious.  The wound was then irrigated again.  I placed a 15 Blake drain, brought out through a stab incision and sutured into position with 2-0 nylon.  I then reapproximated the deep tissue with 2-0 Vicryl.  Subcutaneous tissue was then closed with 2-0 Vicryl and the skin was  closed with running 3-0 nylon.  Sterile dressings were applied.  The arm was wrapped with an Ace bandage for compression.  There were no immediate complications.   Disposition: To PACU stable.   Theotis Burrow, M.D., [facility] [place] Office: [phone] Pager:  [phone]

## 2020-11-26 NOTE — H&P (Addendum)
 [facility] [place]  Patient name: Mark [name] MRN: [id number] DOB: 03/31/1951 Sex: male   REQUESTING PROVIDER:    ER   REASON FOR CONSULT:    Fistula bleeding  HISTORY OF PRESENT ILLNESS:   Mark [name] is a 70 y.o. male, who is status post left arm staged BVT on 11/17/2021and 09/26/2020.  HE is not yet on dialysis.  He woke up this am with bleeding from his fistula and swelling with pain.  He came to the ER for evaluation.  He is not on anticoagulation.   The patient hads a history of a BKA but I ambulatory.  His renal insufficiency is secondary to diabetes and hypertension.  He aslo has a history of heart failure.   PAST MEDICAL HISTORY    Past Medical History:  Diagnosis Date  . Acid indigestion   . Acute encephalopathy 01/01/2016  . Acute renal failure superimposed on stage 3 chronic kidney disease (HCC) 04/16/2015  . Anemia 10/01/2013  . Arthritis   . Bursitis   . Cancer (HCC)    blood cancer  . CHF (congestive heart failure) (HCC)   . Chronic kidney disease   . CKD (chronic kidney disease) stage 3, GFR 30-59 ml/min (HCC) 08/18/2015  . Diabetes mellitus, type 2 (HCC) 04/16/2015  . Diarrhea    chronic  . Diverticulitis   . DM (diabetes mellitus), type 2 with peripheral vascular complications (HCC)    right  leg  . Elevated troponin 10/16/2014  . Essential hypertension 04/08/2014  . History of Clostridium difficile colitis 01/01/2016  . History of kidney stones    passed x 2  . Hypertension    no pcp  . Hypothermia 01/01/2016  . Malnutrition of moderate degree (HCC) 04/17/2015  . Onychomycosis of toenail 04/30/2015  . Phantom limb pain (HCC) 12/12/2013   left bka  . Pneumonia 2020  . Positive for microalbuminuria 08/18/2015  . S/P BKA (below knee amputation) (HCC) 11/21/2013   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    . 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 Seizures (HCC)   . Spleen absent   . Substance abuse (HCC) 04/02/2016   Cocaine  . Wound infection 01/02/2016     FAMILY HISTORY   Family History  Problem Relation Age of Onset  . Diabetes Mother     SOCIAL HISTORY:   Social History   Socioeconomic History  . Marital status: Married    Spouse name: Not on file  . Number of children: 1  . Years of education: Some college  . Highest education level: Not on file  Occupational History  . Occupation: Mows grass  Tobacco Use  . Smoking status: Former Smoker    Packs/day: 1.00    Years: 10.00    Pack years: 10.00    Quit date: 08/03/2013    Years since quitting: 7.3  . Smokeless tobacco: Never Used  Vaping Use  . Vaping Use: Never used  Substance and Sexual Activity  . Alcohol use: No  . Drug use: Not Currently    Types: Cocaine    Comment: last time 2015  . Sexual activity: Not on file  Other Topics Concern  . Not on file  Social History Narrative   Lives with his wife, Annie   Admitted to [facility] 01/08/16   Full Code   Right-handed     Caffeine: none currently   Social Determinants of Radio broadcast assistant Strain: Not on file  Food Insecurity: Not on file  Transportation Needs: Not on file  Physical Activity: Not on file  Stress: Not on file  Social Connections: Not on file  Intimate Partner Violence: Not on file    ALLERGIES:    No Known Allergies  CURRENT MEDICATIONS:    No current facility-administered medications for this encounter.   Current Outpatient Medications  Medication Sig Dispense Refill  . acetaminophen (TYLENOL) 500 MG tablet Take 500 mg by mouth every 6 (six) hours as needed for moderate pain or headache.    Marland Kitchen amLODipine (NORVASC) 10 MG tablet Take 1 tablet (10 mg total) by mouth daily. (Patient taking differently: Take 10 mg by mouth in the morning and at bedtime.) 30 tablet 6  . aspirin EC 81 MG tablet Take 1 tablet (81 mg total) by mouth daily. 30  tablet 5  . atorvastatin (LIPITOR) 20 MG tablet TAKE 1 TABLET BY MOUTH  DAILY (Patient taking differently: Take 20 mg by mouth daily.) 90 tablet 0  . calcitRIOL (ROCALTROL) 0.5 MCG capsule Take 0.5 mcg by mouth daily.    . carvedilol (COREG) 25 MG tablet Take 1 tablet (25 mg total) by mouth 2 (two) times daily with a meal. 60 tablet 6  . cholecalciferol (VITAMIN D3) 25 MCG (1000 UNIT) tablet Take 1,000 Units by mouth daily.    Marland Kitchen docusate sodium (COLACE) 100 MG capsule Take 100 mg by mouth daily as needed for mild constipation.    . ferrous sulfate 325 (65 FE) MG tablet Take 325 mg by mouth 2 (two) times daily.    . furosemide (LASIX) 80 MG tablet Take 120 mg by mouth daily.    . hydrALAZINE (APRESOLINE) 25 MG tablet Take 1 tablet (25 mg total) by mouth 2 (two) times daily. 180 tablet 3  . HYDROcodone-acetaminophen (NORCO/VICODIN) 5-325 MG tablet Take 1 tablet by mouth every 4 (four) hours as needed for moderate pain. 20 tablet 0  . Insulin Glargine (LANTUS SOLOSTAR) 100 UNIT/ML Solostar Pen Inject 10 Units into the skin at bedtime. (Patient taking differently: Inject 10 Units into the skin at bedtime as needed (bs >200).) 15 mL 0  . Multiple Vitamin (MULTIVITAMIN WITH MINERALS) TABS tablet Take 1 tablet by mouth daily.    . sodium bicarbonate 650 MG tablet Take 1,950 mg by mouth 2 (two) times daily.     . VELTASSA 8.4 g packet Take 1 packet by mouth daily.    . Blood Glucose Monitoring Suppl (ONETOUCH VERIO) w/Device KIT Use as directed to test blood sugar three times daily. 1 kit 0  . gabapentin (NEURONTIN) 100 MG capsule Take 1 capsule (100 mg total) by mouth at bedtime. (Patient not taking: Reported on 11/26/2020) 30 capsule 3  . glucose blood (ONETOUCH VERIO) test strip Use as directed to test blood sugar three times daily. 100 each 12  . Insulin Pen Needle (TRUEPLUS PEN NEEDLES) 32G X 4 MM MISC Use as directed to administer lantus daily 100 each 1  . Insulin Syringe-Needle U-100 (INSULIN  SYRINGE .5CC/30GX5/16") 30G X 5/16" 0.5 ML MISC Check blood sugar TID & QHS 100 each 2  . OneTouch Delica Lancets 17C MISC Use as directed to test blood sugar three times daily. 100 each 12   Facility-Administered Medications Ordered in Other Encounters  Medication Dose Route Frequency Provider Last Rate Last Admin  . vancomycin (VANCOREADY) IVPB 1500 mg/300 mL  1,500 mg Intravenous Once Angelia Mould, MD        REVIEW OF SYSTEMS:   [X] denotes positive finding, [ ] denotes negative finding Cardiac  Comments:  Chest pain or chest pressure:    Shortness of breath upon exertion:    Short of breath when lying flat:    Irregular heart rhythm:        Vascular    Pain in calf, thigh, or hip brought on by ambulation:    Pain in feet at night that wakes you up from your sleep:     Blood clot in your veins:    Leg swelling:         Pulmonary    Oxygen at home:    Productive cough:     Wheezing:         Neurologic    Sudden weakness in arms or legs:     Sudden numbness in arms or legs:     Sudden onset of difficulty speaking or slurred speech:    Temporary loss of vision in one eye:     Problems with dizziness:         Gastrointestinal    Blood in stool:      Vomited blood:         Genitourinary    Burning when urinating:     Blood in urine:        Psychiatric    Major depression:         Hematologic    Bleeding problems:    Problems with blood clotting too easily:        Skin    Rashes or ulcers:        Constitutional    Fever or chills:     PHYSICAL EXAM:   Vitals:   11/26/20 1304  BP: (!) 148/69  Pulse: 74  Temp: 98.2 F (36.8 C)  TempSrc: Oral  SpO2: 100%    GENERAL: The patient is a well-nourished male, in no acute distress. The vital signs are documented above. CARDIAC: There is a regular rate and rhythm.  VASCULAR: palpable thrill in fistula with large surrounding hematoma in the upper arm.  There is no pulse or thrill in the  hematoma PULMONARY: Nonlabored respirations ABDOMEN: Soft and non-tender with normal pitched bowel sounds.  MUSCULOSKELETAL: left BKA NEUROLOGIC: No focal weakness or paresthesias are detected. SKIN: There are no ulcers or rashes noted. PSYCHIATRIC: The patient has a normal affect.  STUDIES:     ASSESSMENT and PLAN   Hematoma surrounding the upper portion of the left arm fistula of unknown etiology.  I will get a duplex to look for active bleeding, however due to the size of the hematoma, this will need to be evacuated in the OR.  I discussed proceeding tonight and then keeping him overnight for observation.  He last ate at 11am and needs to be COVID tested   [name], IV, MD, FACS [facility] [phone] Pager [phone]

## 2020-11-26 NOTE — ED Triage Notes (Signed)
Fistula placed 09/26/20 left arm. Patient noted bleeding/swelling this am. 7/10 sharp, intermittent pain.

## 2020-11-27 ENCOUNTER — Encounter (HOSPITAL_COMMUNITY): Payer: Self-pay | Admitting: Surgery

## 2020-11-27 DIAGNOSIS — L0390 Cellulitis, unspecified: Secondary | ICD-10-CM | POA: Diagnosis not present

## 2020-11-27 DIAGNOSIS — A419 Sepsis, unspecified organism: Secondary | ICD-10-CM | POA: Diagnosis not present

## 2020-11-27 DIAGNOSIS — M726 Necrotizing fasciitis: Secondary | ICD-10-CM | POA: Diagnosis not present

## 2020-11-27 DIAGNOSIS — A4189 Other specified sepsis: Secondary | ICD-10-CM | POA: Diagnosis not present

## 2020-11-27 DIAGNOSIS — S40022A Contusion of left upper arm, initial encounter: Secondary | ICD-10-CM | POA: Diagnosis not present

## 2020-11-27 LAB — HIV ANTIBODY (ROUTINE TESTING W REFLEX): HIV Screen 4th Generation wRfx: NONREACTIVE

## 2020-11-27 LAB — GLUCOSE, CAPILLARY
Glucose-Capillary: 174 mg/dL — ABNORMAL HIGH (ref 70–99)
Glucose-Capillary: 115 mg/dL — ABNORMAL HIGH (ref 70–99)
Glucose-Capillary: 159 mg/dL — ABNORMAL HIGH (ref 70–99)

## 2020-11-27 LAB — HEMOGLOBIN AND HEMATOCRIT, BLOOD
HCT: 23.8 % — ABNORMAL LOW (ref 39.0–52.0)
Hemoglobin: 7.5 g/dL — ABNORMAL LOW (ref 13.0–17.0)

## 2020-11-27 MED ORDER — HYDROCODONE-ACETAMINOPHEN 5-325 MG PO TABS: 1.0000 | ORAL_TABLET | ORAL | 0 refills | Status: DC | PRN

## 2020-11-27 MED ORDER — CEPHALEXIN 500 MG PO CAPS: 500.0000 mg | ORAL_CAPSULE | Freq: Every day | ORAL | 0 refills | Status: AC

## 2020-11-27 NOTE — Discharge Instructions (Signed)
[facility]  Discharge Instructions  AV Fistula or Graft Surgery for Dialysis Access  Please refer to the following instructions for your post-procedure care. Your surgeon or physician assistant will discuss any changes with you.  Activity  You may drive the day following your surgery, if you are comfortable and no longer taking prescription pain medication. Resume full activity as the soreness in your incision resolves.  Bathing/Showering  You may shower after you go home. Keep your incision dry for 48 hours. Do not soak in a bathtub, hot tub, or swim until the incision heals completely. You may not shower if you have a hemodialysis catheter.  Incision Care  Clean your incision with mild soap and water after 48 hours. Pat the area dry with a clean towel. You do not need a bandage unless otherwise instructed. Do not apply any ointments or creams to your incision. You may have skin glue on your incision. Do not peel it off. It will come off on its own in about one week. Your arm may swell a bit after surgery. To reduce swelling use pillows to elevate your arm so it is above your heart. Your doctor will tell you if you need to lightly wrap your arm with an ACE bandage.  Diet  Resume your normal diet. There are not special food restrictions following this procedure. In order to heal from your surgery, it is CRITICAL to get adequate nutrition. Your body requires vitamins, minerals, and protein. Vegetables are the best source of vitamins and minerals. Vegetables also provide the perfect balance of protein. Processed food has little nutritional value, so try to avoid this.  Medications  Resume taking all of your medications. If your incision is causing pain, you may take over-the counter pain relievers such as acetaminophen (Tylenol). If you were prescribed a stronger pain medication, please be aware these medications can cause nausea and constipation. Prevent  nausea by taking the medication with a snack or meal. Avoid constipation by drinking plenty of fluids and eating foods with high amount of fiber, such as fruits, vegetables, and grains.   Do not take Tylenol if you are taking prescription pain medications.  Follow up Your surgeon may want to see you in the office following your access surgery. If so, this will be arranged at the time of your surgery.  Please call us immediately for any of the following conditions:  . Increased pain, redness, drainage (pus) from your incision site . Fever of 101 degrees or higher . Severe or worsening pain at your incision site . Hand pain or numbness. .  Reduce your risk of vascular disease:  . Stop smoking. If you would like help, call [facility] at 1-800-QUIT-NOW 250-257-7779) or [place] at [phone]  . Manage your cholesterol . Maintain a desired weight . Control your diabetes . Keep your blood pressure down  Dialysis  It will take several weeks to several months for your new dialysis access to be ready for use. Your surgeon will determine when it is okay to use it. Your nephrologist will continue to direct your dialysis. You can continue to use your Permcath until your new access is ready for use.   11/27/2020 Mark [name] [id number] 1951-02-02  Surgeon(s): Serafina Mitchell, MD  Procedure(s): EVACUATION HEMATOMA LEFT ARM  X May stick AV fistula immediately if needed   May stick graft on designated area only:   X Do not stick in area of incision for 6  weeks    If you have any questions, please call the office at [phone].

## 2020-11-27 NOTE — Progress Notes (Addendum)
D/C tele and IVs. Went over AVS with pt and all question were answered. Pt going home with rolling walker.   Lavenia Atlas, RN

## 2020-11-27 NOTE — Progress Notes (Addendum)
Progress Note    11/27/2020 7:46 AM 1 Day Post-Op  Subjective: no major complaints   Vitals:   11/27/20 0240 11/27/20 0347  BP: (!) 141/64 135/64  Pulse: 73 71  Resp: 13 12  Temp: 97.9 F (36.6 C) 98.3 F (36.8 C)  SpO2: 99% 98%   Physical Exam: Cardiac:  regular Lungs: non labored Incisions: left upper extremity dressed. JP drain with minimal bloody output 20 cc overnight Extremities: left upper extremity is well perfused and warm. 2+ radial pulse. 5/5 grip strength Neurologic: alert and oriented  CBC    Component Value Date/Time   WBC 9.9 11/26/2020 2258   RBC 2.36 (L) 11/26/2020 2258   HGB 7.5 (L) 11/27/2020 0601   HGB 9.7 (L) 08/19/2020 1141   HGB 7.9 (L) 10/18/2017 1424   HCT 23.8 (L) 11/27/2020 0601   HCT 25.4 (L) 10/18/2017 1424   PLT 350 11/26/2020 2258   PLT 271 08/19/2020 1141   PLT 356 10/18/2017 1424   MCV 98.7 11/26/2020 2258   MCV 92 10/18/2017 1424   MCH 31.4 11/26/2020 2258   MCHC 31.8 11/26/2020 2258   RDW 15.5 11/26/2020 2258   RDW 16.4 (H) 10/18/2017 1424   LYMPHSABS 2.5 11/26/2020 1329   LYMPHSABS 3.9 (H) 10/18/2017 1424   MONOABS 0.9 11/26/2020 1329   EOSABS 0.3 11/26/2020 1329   EOSABS 0.5 (H) 10/18/2017 1424   BASOSABS 0.1 11/26/2020 1329   BASOSABS 0.0 10/18/2017 1424    BMET    Component Value Date/Time   NA 143 11/26/2020 2258   NA 145 (H) 10/18/2017 1424   K 5.1 11/26/2020 2258   CL 122 (H) 11/26/2020 2258   CO2 13 (L) 11/26/2020 2258   GLUCOSE 101 (H) 11/26/2020 2258   BUN 83 (H) 11/26/2020 2258   BUN 54 (H) 10/18/2017 1424   CREATININE 10.50 (H) 11/26/2020 2258   CREATININE 9.09 (HH) 08/19/2020 1141   CREATININE 2.16 (H) 11/16/2016 1148   CALCIUM 8.5 (L) 11/26/2020 2258   GFRNONAA 5 (L) 11/26/2020 2258   GFRNONAA 6 (L) 08/19/2020 1141   GFRNONAA 31 (L) 11/16/2016 1148   GFRAA 8 (L) 05/30/2020 1111   GFRAA 7 (L) 02/18/2020 1357   GFRAA 36 (L) 11/16/2016 1148    INR    Component Value Date/Time   INR 1.2  11/26/2020 2258     Intake/Output Summary (Last 24 hours) at 11/27/2020 0746 Last data filed at 11/27/2020 0631 Gross per 24 hour  Intake 1665 ml  Output 1220 ml  Net 445 ml     Assessment/Plan:  70 y.o. male is s/p evacuation of hematoma left arm 1 Day Post-Op. Doing well post op. Minimal output from JP drain. Will possibly d/c later today prior to discharge. VSS. Afebrile. Transfused 1 unit of PRBCs. Hgb 7.5 post transfusion. Will go ahead and release second unit ordered. Will need to go home on Keflex 500 mg BID x 7 days at discharge. Possible discharge later this afternoon.    Karoline Caldwell, PA-C [facility] [phone]  I agree with the above.  I have seen and evaluated the patient.  He is getting a total of 2 units of blood transfused for acute blood loss anemia.  There has been minimal drainage from his JP which can be removed today.  He potentially could go home today.  He will let us know this afternoon if he would like to go home.  I told him to keep the Ace wrap in place for  at least a week.  It can be changed on a daily basis.  He will continue with Keflex for 5 days postop  Wells Eulalio Reamy 11/27/2020 7:46 AM

## 2020-11-27 NOTE — Anesthesia Postprocedure Evaluation (Signed)
Anesthesia Post Note  Patient: Mark [name]  Procedure(s) Performed: EVACUATION HEMATOMA LEFT ARM (Left Arm Upper)     Patient location during evaluation: PACU Anesthesia Type: General Level of consciousness: awake Pain management: pain level controlled Vital Signs Assessment: post-procedure vital signs reviewed and stable Respiratory status: spontaneous breathing Cardiovascular status: stable Postop Assessment: no apparent nausea or vomiting Anesthetic complications: no   No complications documented.  Last Vitals:  Vitals:   11/26/20 2247 11/27/20 0022  BP: 122/60 136/73  Pulse: 78 71  Resp: 15 14  Temp: 36.5 C 36.4 C  SpO2: 97% 98%    Last Pain:  Vitals:   11/27/20 0022  TempSrc: Oral  PainSc:                  Seve Monette

## 2020-11-27 NOTE — Care Management (Signed)
11-27-20 Case Manager ordered rolling walker via [employer] to be delivered to the room prior to transition home. Patient states he has transportation home. No further needs from Case Manager at this time. Bethena Roys, RN,BSN Case Manager

## 2020-11-28 ENCOUNTER — Telehealth: Payer: Self-pay

## 2020-11-28 ENCOUNTER — Ambulatory Visit: Payer: Medicare Other

## 2020-11-28 LAB — TYPE AND SCREEN
ABO/RH(D): O POS
Antibody Screen: NEGATIVE
Unit division: 0
Unit division: 0

## 2020-11-28 LAB — BPAM RBC
Blood Product Expiration Date: 202204282359
Blood Product Expiration Date: 202204282359
ISSUE DATE / TIME: 202203310028
ISSUE DATE / TIME: 202203311242
Unit Type and Rh: 5100
Unit Type and Rh: 5100

## 2020-11-28 NOTE — Telephone Encounter (Signed)
Transition Care Management Follow-up Telephone Call  Date of discharge and from where:[facility] on 11/27/2020 How have you been since you were released from the hospital? Stated he is feeling ok Any questions or concerns? No questions/concerns reported.  Items Reviewed: Did the pt receive and understand the discharge instructions provided? Pt have the instructions and have no questions.  Medications obtained and verified?  Yes medications were obtained and pt has no questions Any new allergies since your discharge? None reported  Do you have support at home? Yes, wife Other (ie: DME, Home Health, etc)     N / A  Functional Questionnaire: (I = Independent and D = Dependent) ADL's:  Independent.      Follow up appointments reviewed:   PCP Hospital f/u appt confirmed? Dr Wynetta Emery on 12/18/2020@ 1050.  [facility] f/u appt confirmed? None scheduled at this time  Are transportation arrangements needed? Pt stated have transportation   If their condition worsens, is the pt aware to call  their PCP or go to the ED? Yes. Made pt aware if condition worsen or start experiencing rapid weight gain, chest pain, diff breathing, SOB, high fevers, or bleading to refer imediately to ED for further evaluation.  Was the patient provided with contact information for the PCP's office or ED? Pt has been provided with  Office phone number at this time.   Was the pt encouraged to call back with questions or concerns? yes

## 2020-12-01 LAB — AEROBIC/ANAEROBIC CULTURE W GRAM STAIN (SURGICAL/DEEP WOUND): Culture: NORMAL

## 2020-12-01 NOTE — Discharge Summary (Signed)
[facility] Discharge Summary   Patient ID:  Mark [name] MRN: [id number] DOB/AGE: 11-23-1950 70 y.o.  Admit date: 11/26/2020 Discharge date: 11/27/20 Date of Surgery: 11/26/2020 Surgeon: Surgeon(s): Serafina Mitchell, MD  Admission Diagnosis: Complication of arteriovenous dialysis fistula, initial encounter [T82.9XXA] ESRD (end stage renal disease) on dialysis ([place]) [N18.6, Z99.2]  Discharge Diagnoses:  Complication of arteriovenous dialysis fistula, initial encounter [T82.9XXA] ESRD (end stage renal disease) on dialysis ([place]) [N18.6, Z99.2]  Secondary Diagnoses: Past Medical History:  Diagnosis Date  . Acid indigestion   . Acute encephalopathy 01/01/2016  . Acute renal failure superimposed on stage 3 chronic kidney disease ([place]) 04/16/2015  . Anemia 10/01/2013  . Arthritis   . Bursitis   . Cancer ([place])    blood cancer  . CHF (congestive heart failure) ([place])   . Chronic kidney disease   . CKD (chronic kidney disease) stage 3, GFR 30-59 ml/min (HCC) 08/18/2015  . Diabetes mellitus, type 2 ([place]) 04/16/2015  . Diarrhea    chronic  . Diverticulitis   . DM (diabetes mellitus), type 2 with peripheral vascular complications (HCC)    right  leg  . Elevated troponin 10/16/2014  . Essential hypertension 04/08/2014  . History of Clostridium difficile colitis 01/01/2016  . History of kidney stones    passed x 2  . Hypertension    no pcp  . Hypothermia 01/01/2016  . Malnutrition of moderate degree ([place]) 04/17/2015  . Onychomycosis of toenail 04/30/2015  . Phantom limb pain ([place]) 12/12/2013   left bka  . Pneumonia 2020  . Positive for microalbuminuria 08/18/2015  . S/P BKA (below knee amputation) ([place]) 11/21/2013   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    . 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 Seizures ([place])   . Spleen absent   . Substance abuse ([place]) 04/02/2016   Cocaine  . Wound infection  01/02/2016    Procedure(s): EVACUATION HEMATOMA LEFT ARM  Discharged Condition: good  HPI: Mark [name] is a 70 y.o. male, who is status post left arm staged BVT on 11/17/2021and 09/26/2020.  HE is not yet on dialysis.  He woke up this am with bleeding from his fistula and swelling with pain.  He came to the ER for evaluation.  He is not on anticoagulation.    Hematoma surrounding the upper portion of the left arm fistula of unknown etiology.  I will get a duplex to look for active bleeding, however due to the size of the hematoma, this will need to be evacuated in the OR.  Hospital Course:  Mark [name] is a 70 y.o. male is S/P  Procedure(s): EVACUATION HEMATOMA LEFT ARM Post op blood loss anemia he received 2 units of PRBC.   left upper extremity is well perfused and warm. 2+ radial pulse. 5/5 grip strength.  Discharged in stable condition.  Consults:  Treatment Team:  Serafina Mitchell, MD  Significant Diagnostic Studies: CBC Lab Results  Component Value Date   WBC 9.9 11/26/2020   HGB 7.5 (L) 11/27/2020   HCT 23.8 (L) 11/27/2020   MCV 98.7 11/26/2020   PLT 350 11/26/2020    BMET    Component Value Date/Time   NA 143 11/26/2020 2258   NA 145 (H) 10/18/2017 1424   K 5.1 11/26/2020 2258   CL 122 (H) 11/26/2020 2258   CO2 13 (  L) 11/26/2020 2258   GLUCOSE 101 (H) 11/26/2020 2258   BUN 83 (H) 11/26/2020 2258   BUN 54 (H) 10/18/2017 1424   CREATININE 10.50 (H) 11/26/2020 2258   CREATININE 9.09 (HH) 08/19/2020 1141   CREATININE 2.16 (H) 11/16/2016 1148   CALCIUM 8.5 (L) 11/26/2020 2258   GFRNONAA 5 (L) 11/26/2020 2258   GFRNONAA 6 (L) 08/19/2020 1141   GFRNONAA 31 (L) 11/16/2016 1148   GFRAA 8 (L) 05/30/2020 1111   GFRAA 7 (L) 02/18/2020 1357   GFRAA 36 (L) 11/16/2016 1148   COAG Lab Results  Component Value Date   INR 1.2 11/26/2020   INR 1.08 01/05/2017   INR 1.23 01/04/2017     Disposition:  Discharge to :Home Discharge Instructions    Discharge  patient   Complete by: As directed    Discharge disposition: 01-Home or Self Care   Discharge patient date: 11/27/2020     Allergies as of 11/27/2020   No Known Allergies     Medication List    TAKE these medications   acetaminophen 500 MG tablet Commonly known as: TYLENOL Take 500 mg by mouth every 6 (six) hours as needed for moderate pain or headache.   amLODipine 10 MG tablet Commonly known as: NORVASC Take 1 tablet (10 mg total) by mouth daily. What changed: when to take this   aspirin EC 81 MG tablet Take 1 tablet (81 mg total) by mouth daily.   atorvastatin 20 MG tablet Commonly known as: LIPITOR TAKE 1 TABLET BY MOUTH  DAILY   calcitRIOL 0.5 MCG capsule Commonly known as: ROCALTROL Take 0.5 mcg by mouth daily.   carvedilol 25 MG tablet Commonly known as: COREG Take 1 tablet (25 mg total) by mouth 2 (two) times daily with a meal.   cephALEXin 500 MG capsule Commonly known as: KEFLEX Take 1 capsule (500 mg total) by mouth at bedtime for 5 days.   cholecalciferol 25 MCG (1000 UNIT) tablet Commonly known as: VITAMIN D3 Take 1,000 Units by mouth daily.   docusate sodium 100 MG capsule Commonly known as: COLACE Take 100 mg by mouth daily as needed for mild constipation.   ferrous sulfate 325 (65 FE) MG tablet Take 325 mg by mouth 2 (two) times daily.   furosemide 80 MG tablet Commonly known as: LASIX Take 120 mg by mouth daily.   gabapentin 100 MG capsule Commonly known as: NEURONTIN Take 1 capsule (100 mg total) by mouth at bedtime.   hydrALAZINE 25 MG tablet Commonly known as: APRESOLINE Take 1 tablet (25 mg total) by mouth 2 (two) times daily.   HYDROcodone-acetaminophen 5-325 MG tablet Commonly known as: NORCO/VICODIN Take 1 tablet by mouth every 4 (four) hours as needed for moderate pain.   insulin glargine 100 UNIT/ML Solostar Pen Commonly known as: Lantus SoloStar Inject 10 Units into the skin at bedtime. What changed:   when to take  this  reasons to take this   Insulin Pen Needle 32G X 4 MM Misc Commonly known as: TRUEplus Pen Needles Use as directed to administer lantus daily   INSULIN SYRINGE .5CC/30GX5/16" 30G X 5/16" 0.5 ML Misc Check blood sugar TID & QHS   multivitamin with minerals Tabs tablet Take 1 tablet by mouth daily.   OneTouch Delica Lancets 20U Misc Use as directed to test blood sugar three times daily.   OneTouch Verio test strip Generic drug: glucose blood Use as directed to test blood sugar three times daily.   OneTouch Verio w/Device  Kit Use as directed to test blood sugar three times daily.   sodium bicarbonate 650 MG tablet Take 1,950 mg by mouth 2 (two) times daily.   Veltassa 8.4 g packet Generic drug: patiromer Take 1 packet by mouth daily.      Verbal and written Discharge instructions given to the patient. Wound care per Discharge AVS  Follow-up Information    Serafina Mitchell, MD Follow up.   Specialties: Vascular Surgery, Cardiology Why: 2 weeks. The office will contact patient with appointment (sent) Contact information: [place] [address] [phone]        [facility] Follow up.   Why: Rolling Walker to be delivered to the room prior to d/c. home.  Contact information: [place] [address] [phone]               Signed: Roxy Horseman 12/01/2020, 12:57 PM

## 2020-12-03 DIAGNOSIS — E1122 Type 2 diabetes mellitus with diabetic chronic kidney disease: Secondary | ICD-10-CM | POA: Diagnosis not present

## 2020-12-03 DIAGNOSIS — D631 Anemia in chronic kidney disease: Secondary | ICD-10-CM | POA: Diagnosis not present

## 2020-12-03 DIAGNOSIS — E875 Hyperkalemia: Secondary | ICD-10-CM | POA: Diagnosis not present

## 2020-12-03 DIAGNOSIS — I5042 Chronic combined systolic (congestive) and diastolic (congestive) heart failure: Secondary | ICD-10-CM | POA: Diagnosis not present

## 2020-12-03 DIAGNOSIS — R809 Proteinuria, unspecified: Secondary | ICD-10-CM | POA: Diagnosis not present

## 2020-12-03 DIAGNOSIS — I770 Arteriovenous fistula, acquired: Secondary | ICD-10-CM | POA: Diagnosis not present

## 2020-12-03 DIAGNOSIS — I120 Hypertensive chronic kidney disease with stage 5 chronic kidney disease or end stage renal disease: Secondary | ICD-10-CM | POA: Diagnosis not present

## 2020-12-03 DIAGNOSIS — N2581 Secondary hyperparathyroidism of renal origin: Secondary | ICD-10-CM | POA: Diagnosis not present

## 2020-12-03 DIAGNOSIS — N189 Chronic kidney disease, unspecified: Secondary | ICD-10-CM | POA: Diagnosis not present

## 2020-12-03 DIAGNOSIS — N185 Chronic kidney disease, stage 5: Secondary | ICD-10-CM | POA: Diagnosis not present

## 2020-12-11 DIAGNOSIS — N186 End stage renal disease: Secondary | ICD-10-CM | POA: Diagnosis not present

## 2020-12-11 DIAGNOSIS — Z992 Dependence on renal dialysis: Secondary | ICD-10-CM | POA: Diagnosis not present

## 2020-12-12 DIAGNOSIS — N186 End stage renal disease: Secondary | ICD-10-CM | POA: Diagnosis not present

## 2020-12-12 DIAGNOSIS — T8249XD Other complication of vascular dialysis catheter, subsequent encounter: Secondary | ICD-10-CM | POA: Diagnosis not present

## 2020-12-12 DIAGNOSIS — Z23 Encounter for immunization: Secondary | ICD-10-CM | POA: Diagnosis not present

## 2020-12-12 DIAGNOSIS — A499 Bacterial infection, unspecified: Secondary | ICD-10-CM | POA: Diagnosis not present

## 2020-12-12 DIAGNOSIS — Z992 Dependence on renal dialysis: Secondary | ICD-10-CM | POA: Diagnosis not present

## 2020-12-12 DIAGNOSIS — D472 Monoclonal gammopathy: Secondary | ICD-10-CM | POA: Diagnosis not present

## 2020-12-12 DIAGNOSIS — Z0389 Encounter for observation for other suspected diseases and conditions ruled out: Secondary | ICD-10-CM | POA: Diagnosis not present

## 2020-12-12 DIAGNOSIS — E8770 Fluid overload, unspecified: Secondary | ICD-10-CM | POA: Diagnosis not present

## 2020-12-12 DIAGNOSIS — N2581 Secondary hyperparathyroidism of renal origin: Secondary | ICD-10-CM | POA: Diagnosis not present

## 2020-12-12 DIAGNOSIS — D631 Anemia in chronic kidney disease: Secondary | ICD-10-CM | POA: Diagnosis not present

## 2020-12-12 DIAGNOSIS — E875 Hyperkalemia: Secondary | ICD-10-CM | POA: Diagnosis not present

## 2020-12-12 DIAGNOSIS — R519 Headache, unspecified: Secondary | ICD-10-CM | POA: Diagnosis not present

## 2020-12-15 DIAGNOSIS — E8770 Fluid overload, unspecified: Secondary | ICD-10-CM | POA: Diagnosis not present

## 2020-12-15 DIAGNOSIS — E875 Hyperkalemia: Secondary | ICD-10-CM | POA: Diagnosis not present

## 2020-12-15 DIAGNOSIS — D472 Monoclonal gammopathy: Secondary | ICD-10-CM | POA: Diagnosis not present

## 2020-12-15 DIAGNOSIS — R519 Headache, unspecified: Secondary | ICD-10-CM | POA: Diagnosis not present

## 2020-12-15 DIAGNOSIS — N2581 Secondary hyperparathyroidism of renal origin: Secondary | ICD-10-CM | POA: Diagnosis not present

## 2020-12-15 DIAGNOSIS — Z23 Encounter for immunization: Secondary | ICD-10-CM | POA: Diagnosis not present

## 2020-12-15 DIAGNOSIS — N186 End stage renal disease: Secondary | ICD-10-CM | POA: Diagnosis not present

## 2020-12-15 DIAGNOSIS — T8249XD Other complication of vascular dialysis catheter, subsequent encounter: Secondary | ICD-10-CM | POA: Diagnosis not present

## 2020-12-15 DIAGNOSIS — D631 Anemia in chronic kidney disease: Secondary | ICD-10-CM | POA: Diagnosis not present

## 2020-12-15 DIAGNOSIS — Z992 Dependence on renal dialysis: Secondary | ICD-10-CM | POA: Diagnosis not present

## 2020-12-15 DIAGNOSIS — Z0389 Encounter for observation for other suspected diseases and conditions ruled out: Secondary | ICD-10-CM | POA: Diagnosis not present

## 2020-12-15 DIAGNOSIS — A499 Bacterial infection, unspecified: Secondary | ICD-10-CM | POA: Diagnosis not present

## 2020-12-16 DIAGNOSIS — D472 Monoclonal gammopathy: Secondary | ICD-10-CM | POA: Diagnosis not present

## 2020-12-16 DIAGNOSIS — E875 Hyperkalemia: Secondary | ICD-10-CM | POA: Diagnosis not present

## 2020-12-16 DIAGNOSIS — N186 End stage renal disease: Secondary | ICD-10-CM | POA: Diagnosis not present

## 2020-12-16 DIAGNOSIS — Z23 Encounter for immunization: Secondary | ICD-10-CM | POA: Diagnosis not present

## 2020-12-16 DIAGNOSIS — E8770 Fluid overload, unspecified: Secondary | ICD-10-CM | POA: Diagnosis not present

## 2020-12-16 DIAGNOSIS — N2581 Secondary hyperparathyroidism of renal origin: Secondary | ICD-10-CM | POA: Diagnosis not present

## 2020-12-16 DIAGNOSIS — A499 Bacterial infection, unspecified: Secondary | ICD-10-CM | POA: Diagnosis not present

## 2020-12-16 DIAGNOSIS — Z0389 Encounter for observation for other suspected diseases and conditions ruled out: Secondary | ICD-10-CM | POA: Diagnosis not present

## 2020-12-16 DIAGNOSIS — T8249XD Other complication of vascular dialysis catheter, subsequent encounter: Secondary | ICD-10-CM | POA: Diagnosis not present

## 2020-12-16 DIAGNOSIS — D631 Anemia in chronic kidney disease: Secondary | ICD-10-CM | POA: Diagnosis not present

## 2020-12-16 DIAGNOSIS — R519 Headache, unspecified: Secondary | ICD-10-CM | POA: Diagnosis not present

## 2020-12-16 DIAGNOSIS — Z992 Dependence on renal dialysis: Secondary | ICD-10-CM | POA: Diagnosis not present

## 2020-12-17 ENCOUNTER — Telehealth: Payer: Self-pay | Admitting: Internal Medicine

## 2020-12-17 NOTE — Telephone Encounter (Signed)
PT said he can't make Hosp F/u appt due to dialyisis and asked if PCP can give him a call. Pt did not want to disclose the reason.

## 2020-12-18 ENCOUNTER — Ambulatory Visit: Payer: Medicare Other | Admitting: Internal Medicine

## 2020-12-18 DIAGNOSIS — N2581 Secondary hyperparathyroidism of renal origin: Secondary | ICD-10-CM | POA: Diagnosis not present

## 2020-12-18 DIAGNOSIS — A499 Bacterial infection, unspecified: Secondary | ICD-10-CM | POA: Diagnosis not present

## 2020-12-18 DIAGNOSIS — Z23 Encounter for immunization: Secondary | ICD-10-CM | POA: Diagnosis not present

## 2020-12-18 DIAGNOSIS — Z992 Dependence on renal dialysis: Secondary | ICD-10-CM | POA: Diagnosis not present

## 2020-12-18 DIAGNOSIS — N186 End stage renal disease: Secondary | ICD-10-CM | POA: Diagnosis not present

## 2020-12-18 DIAGNOSIS — D631 Anemia in chronic kidney disease: Secondary | ICD-10-CM | POA: Diagnosis not present

## 2020-12-18 DIAGNOSIS — E875 Hyperkalemia: Secondary | ICD-10-CM | POA: Diagnosis not present

## 2020-12-18 DIAGNOSIS — R519 Headache, unspecified: Secondary | ICD-10-CM | POA: Diagnosis not present

## 2020-12-18 DIAGNOSIS — Z0389 Encounter for observation for other suspected diseases and conditions ruled out: Secondary | ICD-10-CM | POA: Diagnosis not present

## 2020-12-18 DIAGNOSIS — T8249XD Other complication of vascular dialysis catheter, subsequent encounter: Secondary | ICD-10-CM | POA: Diagnosis not present

## 2020-12-18 DIAGNOSIS — D472 Monoclonal gammopathy: Secondary | ICD-10-CM | POA: Diagnosis not present

## 2020-12-18 DIAGNOSIS — E8770 Fluid overload, unspecified: Secondary | ICD-10-CM | POA: Diagnosis not present

## 2020-12-19 ENCOUNTER — Encounter (HOSPITAL_COMMUNITY): Payer: Medicare Other

## 2020-12-19 DIAGNOSIS — Z0389 Encounter for observation for other suspected diseases and conditions ruled out: Secondary | ICD-10-CM | POA: Diagnosis not present

## 2020-12-19 DIAGNOSIS — Z23 Encounter for immunization: Secondary | ICD-10-CM | POA: Diagnosis not present

## 2020-12-19 DIAGNOSIS — N186 End stage renal disease: Secondary | ICD-10-CM | POA: Diagnosis not present

## 2020-12-19 DIAGNOSIS — E8770 Fluid overload, unspecified: Secondary | ICD-10-CM | POA: Diagnosis not present

## 2020-12-19 DIAGNOSIS — Z992 Dependence on renal dialysis: Secondary | ICD-10-CM | POA: Diagnosis not present

## 2020-12-19 DIAGNOSIS — D472 Monoclonal gammopathy: Secondary | ICD-10-CM | POA: Diagnosis not present

## 2020-12-19 DIAGNOSIS — N2581 Secondary hyperparathyroidism of renal origin: Secondary | ICD-10-CM | POA: Diagnosis not present

## 2020-12-19 DIAGNOSIS — T8249XD Other complication of vascular dialysis catheter, subsequent encounter: Secondary | ICD-10-CM | POA: Diagnosis not present

## 2020-12-19 DIAGNOSIS — A499 Bacterial infection, unspecified: Secondary | ICD-10-CM | POA: Diagnosis not present

## 2020-12-19 DIAGNOSIS — E875 Hyperkalemia: Secondary | ICD-10-CM | POA: Diagnosis not present

## 2020-12-19 DIAGNOSIS — R519 Headache, unspecified: Secondary | ICD-10-CM | POA: Diagnosis not present

## 2020-12-19 DIAGNOSIS — D631 Anemia in chronic kidney disease: Secondary | ICD-10-CM | POA: Diagnosis not present

## 2020-12-22 DIAGNOSIS — N2581 Secondary hyperparathyroidism of renal origin: Secondary | ICD-10-CM | POA: Diagnosis not present

## 2020-12-22 DIAGNOSIS — Z992 Dependence on renal dialysis: Secondary | ICD-10-CM | POA: Diagnosis not present

## 2020-12-22 DIAGNOSIS — E875 Hyperkalemia: Secondary | ICD-10-CM | POA: Diagnosis not present

## 2020-12-22 DIAGNOSIS — T8249XD Other complication of vascular dialysis catheter, subsequent encounter: Secondary | ICD-10-CM | POA: Diagnosis not present

## 2020-12-22 DIAGNOSIS — D472 Monoclonal gammopathy: Secondary | ICD-10-CM | POA: Diagnosis not present

## 2020-12-22 DIAGNOSIS — N186 End stage renal disease: Secondary | ICD-10-CM | POA: Diagnosis not present

## 2020-12-22 DIAGNOSIS — A499 Bacterial infection, unspecified: Secondary | ICD-10-CM | POA: Diagnosis not present

## 2020-12-22 DIAGNOSIS — Z0389 Encounter for observation for other suspected diseases and conditions ruled out: Secondary | ICD-10-CM | POA: Diagnosis not present

## 2020-12-22 DIAGNOSIS — Z23 Encounter for immunization: Secondary | ICD-10-CM | POA: Diagnosis not present

## 2020-12-22 DIAGNOSIS — R519 Headache, unspecified: Secondary | ICD-10-CM | POA: Diagnosis not present

## 2020-12-22 DIAGNOSIS — E8770 Fluid overload, unspecified: Secondary | ICD-10-CM | POA: Diagnosis not present

## 2020-12-22 DIAGNOSIS — D631 Anemia in chronic kidney disease: Secondary | ICD-10-CM | POA: Diagnosis not present

## 2020-12-23 DIAGNOSIS — E8770 Fluid overload, unspecified: Secondary | ICD-10-CM | POA: Diagnosis not present

## 2020-12-23 DIAGNOSIS — E875 Hyperkalemia: Secondary | ICD-10-CM | POA: Diagnosis not present

## 2020-12-23 DIAGNOSIS — Z992 Dependence on renal dialysis: Secondary | ICD-10-CM | POA: Diagnosis not present

## 2020-12-23 DIAGNOSIS — N186 End stage renal disease: Secondary | ICD-10-CM | POA: Diagnosis not present

## 2020-12-23 DIAGNOSIS — N2581 Secondary hyperparathyroidism of renal origin: Secondary | ICD-10-CM | POA: Diagnosis not present

## 2020-12-23 DIAGNOSIS — A499 Bacterial infection, unspecified: Secondary | ICD-10-CM | POA: Diagnosis not present

## 2020-12-23 DIAGNOSIS — D472 Monoclonal gammopathy: Secondary | ICD-10-CM | POA: Diagnosis not present

## 2020-12-23 DIAGNOSIS — Z0389 Encounter for observation for other suspected diseases and conditions ruled out: Secondary | ICD-10-CM | POA: Diagnosis not present

## 2020-12-23 DIAGNOSIS — Z23 Encounter for immunization: Secondary | ICD-10-CM | POA: Diagnosis not present

## 2020-12-23 DIAGNOSIS — D631 Anemia in chronic kidney disease: Secondary | ICD-10-CM | POA: Diagnosis not present

## 2020-12-23 DIAGNOSIS — T8249XD Other complication of vascular dialysis catheter, subsequent encounter: Secondary | ICD-10-CM | POA: Diagnosis not present

## 2020-12-23 DIAGNOSIS — R519 Headache, unspecified: Secondary | ICD-10-CM | POA: Diagnosis not present

## 2020-12-25 DIAGNOSIS — E8770 Fluid overload, unspecified: Secondary | ICD-10-CM | POA: Diagnosis not present

## 2020-12-25 DIAGNOSIS — N186 End stage renal disease: Secondary | ICD-10-CM | POA: Diagnosis not present

## 2020-12-25 DIAGNOSIS — E875 Hyperkalemia: Secondary | ICD-10-CM | POA: Diagnosis not present

## 2020-12-25 DIAGNOSIS — N2581 Secondary hyperparathyroidism of renal origin: Secondary | ICD-10-CM | POA: Diagnosis not present

## 2020-12-25 DIAGNOSIS — T8249XD Other complication of vascular dialysis catheter, subsequent encounter: Secondary | ICD-10-CM | POA: Diagnosis not present

## 2020-12-25 DIAGNOSIS — D631 Anemia in chronic kidney disease: Secondary | ICD-10-CM | POA: Diagnosis not present

## 2020-12-25 DIAGNOSIS — A499 Bacterial infection, unspecified: Secondary | ICD-10-CM | POA: Diagnosis not present

## 2020-12-25 DIAGNOSIS — R519 Headache, unspecified: Secondary | ICD-10-CM | POA: Diagnosis not present

## 2020-12-25 DIAGNOSIS — Z0389 Encounter for observation for other suspected diseases and conditions ruled out: Secondary | ICD-10-CM | POA: Diagnosis not present

## 2020-12-25 DIAGNOSIS — D472 Monoclonal gammopathy: Secondary | ICD-10-CM | POA: Diagnosis not present

## 2020-12-25 DIAGNOSIS — Z23 Encounter for immunization: Secondary | ICD-10-CM | POA: Diagnosis not present

## 2020-12-25 DIAGNOSIS — Z992 Dependence on renal dialysis: Secondary | ICD-10-CM | POA: Diagnosis not present

## 2020-12-26 DIAGNOSIS — A499 Bacterial infection, unspecified: Secondary | ICD-10-CM | POA: Diagnosis not present

## 2020-12-26 DIAGNOSIS — Z0389 Encounter for observation for other suspected diseases and conditions ruled out: Secondary | ICD-10-CM | POA: Diagnosis not present

## 2020-12-26 DIAGNOSIS — R519 Headache, unspecified: Secondary | ICD-10-CM | POA: Diagnosis not present

## 2020-12-26 DIAGNOSIS — N2581 Secondary hyperparathyroidism of renal origin: Secondary | ICD-10-CM | POA: Diagnosis not present

## 2020-12-26 DIAGNOSIS — T8249XD Other complication of vascular dialysis catheter, subsequent encounter: Secondary | ICD-10-CM | POA: Diagnosis not present

## 2020-12-26 DIAGNOSIS — N186 End stage renal disease: Secondary | ICD-10-CM | POA: Diagnosis not present

## 2020-12-26 DIAGNOSIS — E8770 Fluid overload, unspecified: Secondary | ICD-10-CM | POA: Diagnosis not present

## 2020-12-26 DIAGNOSIS — D472 Monoclonal gammopathy: Secondary | ICD-10-CM | POA: Diagnosis not present

## 2020-12-26 DIAGNOSIS — Z23 Encounter for immunization: Secondary | ICD-10-CM | POA: Diagnosis not present

## 2020-12-26 DIAGNOSIS — Z992 Dependence on renal dialysis: Secondary | ICD-10-CM | POA: Diagnosis not present

## 2020-12-26 DIAGNOSIS — D631 Anemia in chronic kidney disease: Secondary | ICD-10-CM | POA: Diagnosis not present

## 2020-12-26 DIAGNOSIS — E875 Hyperkalemia: Secondary | ICD-10-CM | POA: Diagnosis not present

## 2020-12-27 DIAGNOSIS — N186 End stage renal disease: Secondary | ICD-10-CM | POA: Diagnosis not present

## 2020-12-27 DIAGNOSIS — E1129 Type 2 diabetes mellitus with other diabetic kidney complication: Secondary | ICD-10-CM | POA: Diagnosis not present

## 2020-12-27 DIAGNOSIS — Z992 Dependence on renal dialysis: Secondary | ICD-10-CM | POA: Diagnosis not present

## 2020-12-29 DIAGNOSIS — N186 End stage renal disease: Secondary | ICD-10-CM | POA: Diagnosis not present

## 2020-12-29 DIAGNOSIS — D472 Monoclonal gammopathy: Secondary | ICD-10-CM | POA: Diagnosis not present

## 2020-12-29 DIAGNOSIS — N2581 Secondary hyperparathyroidism of renal origin: Secondary | ICD-10-CM | POA: Diagnosis not present

## 2020-12-29 DIAGNOSIS — D631 Anemia in chronic kidney disease: Secondary | ICD-10-CM | POA: Diagnosis not present

## 2020-12-29 DIAGNOSIS — T8249XD Other complication of vascular dialysis catheter, subsequent encounter: Secondary | ICD-10-CM | POA: Diagnosis not present

## 2020-12-29 DIAGNOSIS — E8770 Fluid overload, unspecified: Secondary | ICD-10-CM | POA: Diagnosis not present

## 2020-12-29 DIAGNOSIS — Z4931 Encounter for adequacy testing for hemodialysis: Secondary | ICD-10-CM | POA: Diagnosis not present

## 2020-12-29 DIAGNOSIS — Z992 Dependence on renal dialysis: Secondary | ICD-10-CM | POA: Diagnosis not present

## 2020-12-29 DIAGNOSIS — E875 Hyperkalemia: Secondary | ICD-10-CM | POA: Diagnosis not present

## 2020-12-30 DIAGNOSIS — N186 End stage renal disease: Secondary | ICD-10-CM | POA: Diagnosis not present

## 2020-12-30 DIAGNOSIS — Z4931 Encounter for adequacy testing for hemodialysis: Secondary | ICD-10-CM | POA: Diagnosis not present

## 2020-12-30 DIAGNOSIS — Z992 Dependence on renal dialysis: Secondary | ICD-10-CM | POA: Diagnosis not present

## 2020-12-30 DIAGNOSIS — T8249XD Other complication of vascular dialysis catheter, subsequent encounter: Secondary | ICD-10-CM | POA: Diagnosis not present

## 2020-12-30 DIAGNOSIS — D631 Anemia in chronic kidney disease: Secondary | ICD-10-CM | POA: Diagnosis not present

## 2020-12-30 DIAGNOSIS — E875 Hyperkalemia: Secondary | ICD-10-CM | POA: Diagnosis not present

## 2020-12-30 DIAGNOSIS — D472 Monoclonal gammopathy: Secondary | ICD-10-CM | POA: Diagnosis not present

## 2020-12-30 DIAGNOSIS — E8770 Fluid overload, unspecified: Secondary | ICD-10-CM | POA: Diagnosis not present

## 2020-12-30 DIAGNOSIS — N2581 Secondary hyperparathyroidism of renal origin: Secondary | ICD-10-CM | POA: Diagnosis not present

## 2021-01-01 DIAGNOSIS — E875 Hyperkalemia: Secondary | ICD-10-CM | POA: Diagnosis not present

## 2021-01-01 DIAGNOSIS — Z4931 Encounter for adequacy testing for hemodialysis: Secondary | ICD-10-CM | POA: Diagnosis not present

## 2021-01-01 DIAGNOSIS — D631 Anemia in chronic kidney disease: Secondary | ICD-10-CM | POA: Diagnosis not present

## 2021-01-01 DIAGNOSIS — T8249XD Other complication of vascular dialysis catheter, subsequent encounter: Secondary | ICD-10-CM | POA: Diagnosis not present

## 2021-01-01 DIAGNOSIS — D472 Monoclonal gammopathy: Secondary | ICD-10-CM | POA: Diagnosis not present

## 2021-01-01 DIAGNOSIS — N2581 Secondary hyperparathyroidism of renal origin: Secondary | ICD-10-CM | POA: Diagnosis not present

## 2021-01-01 DIAGNOSIS — N186 End stage renal disease: Secondary | ICD-10-CM | POA: Diagnosis not present

## 2021-01-01 DIAGNOSIS — E8770 Fluid overload, unspecified: Secondary | ICD-10-CM | POA: Diagnosis not present

## 2021-01-01 DIAGNOSIS — Z992 Dependence on renal dialysis: Secondary | ICD-10-CM | POA: Diagnosis not present

## 2021-01-02 DIAGNOSIS — E8770 Fluid overload, unspecified: Secondary | ICD-10-CM | POA: Diagnosis not present

## 2021-01-02 DIAGNOSIS — D472 Monoclonal gammopathy: Secondary | ICD-10-CM | POA: Diagnosis not present

## 2021-01-02 DIAGNOSIS — N186 End stage renal disease: Secondary | ICD-10-CM | POA: Diagnosis not present

## 2021-01-02 DIAGNOSIS — D631 Anemia in chronic kidney disease: Secondary | ICD-10-CM | POA: Diagnosis not present

## 2021-01-02 DIAGNOSIS — Z992 Dependence on renal dialysis: Secondary | ICD-10-CM | POA: Diagnosis not present

## 2021-01-02 DIAGNOSIS — N2581 Secondary hyperparathyroidism of renal origin: Secondary | ICD-10-CM | POA: Diagnosis not present

## 2021-01-02 DIAGNOSIS — T8249XD Other complication of vascular dialysis catheter, subsequent encounter: Secondary | ICD-10-CM | POA: Diagnosis not present

## 2021-01-02 DIAGNOSIS — Z4931 Encounter for adequacy testing for hemodialysis: Secondary | ICD-10-CM | POA: Diagnosis not present

## 2021-01-02 DIAGNOSIS — E875 Hyperkalemia: Secondary | ICD-10-CM | POA: Diagnosis not present

## 2021-01-05 DIAGNOSIS — E875 Hyperkalemia: Secondary | ICD-10-CM | POA: Diagnosis not present

## 2021-01-05 DIAGNOSIS — D631 Anemia in chronic kidney disease: Secondary | ICD-10-CM | POA: Diagnosis not present

## 2021-01-05 DIAGNOSIS — T8249XD Other complication of vascular dialysis catheter, subsequent encounter: Secondary | ICD-10-CM | POA: Diagnosis not present

## 2021-01-05 DIAGNOSIS — Z992 Dependence on renal dialysis: Secondary | ICD-10-CM | POA: Diagnosis not present

## 2021-01-05 DIAGNOSIS — Z4931 Encounter for adequacy testing for hemodialysis: Secondary | ICD-10-CM | POA: Diagnosis not present

## 2021-01-05 DIAGNOSIS — E8770 Fluid overload, unspecified: Secondary | ICD-10-CM | POA: Diagnosis not present

## 2021-01-05 DIAGNOSIS — N2581 Secondary hyperparathyroidism of renal origin: Secondary | ICD-10-CM | POA: Diagnosis not present

## 2021-01-05 DIAGNOSIS — N186 End stage renal disease: Secondary | ICD-10-CM | POA: Diagnosis not present

## 2021-01-05 DIAGNOSIS — D472 Monoclonal gammopathy: Secondary | ICD-10-CM | POA: Diagnosis not present

## 2021-01-06 DIAGNOSIS — Z992 Dependence on renal dialysis: Secondary | ICD-10-CM | POA: Diagnosis not present

## 2021-01-06 DIAGNOSIS — T8249XD Other complication of vascular dialysis catheter, subsequent encounter: Secondary | ICD-10-CM | POA: Diagnosis not present

## 2021-01-06 DIAGNOSIS — N186 End stage renal disease: Secondary | ICD-10-CM | POA: Diagnosis not present

## 2021-01-06 DIAGNOSIS — D472 Monoclonal gammopathy: Secondary | ICD-10-CM | POA: Diagnosis not present

## 2021-01-06 DIAGNOSIS — D631 Anemia in chronic kidney disease: Secondary | ICD-10-CM | POA: Diagnosis not present

## 2021-01-06 DIAGNOSIS — E8770 Fluid overload, unspecified: Secondary | ICD-10-CM | POA: Diagnosis not present

## 2021-01-06 DIAGNOSIS — N2581 Secondary hyperparathyroidism of renal origin: Secondary | ICD-10-CM | POA: Diagnosis not present

## 2021-01-06 DIAGNOSIS — E875 Hyperkalemia: Secondary | ICD-10-CM | POA: Diagnosis not present

## 2021-01-06 DIAGNOSIS — Z4931 Encounter for adequacy testing for hemodialysis: Secondary | ICD-10-CM | POA: Diagnosis not present

## 2021-01-07 ENCOUNTER — Other Ambulatory Visit: Payer: Self-pay

## 2021-01-07 NOTE — Progress Notes (Signed)
Patient arrived at office to have sutures from left arm removed. Area warm dry and intact. Tolerated procedure without difficulty.

## 2021-01-08 DIAGNOSIS — N186 End stage renal disease: Secondary | ICD-10-CM | POA: Diagnosis not present

## 2021-01-08 DIAGNOSIS — E875 Hyperkalemia: Secondary | ICD-10-CM | POA: Diagnosis not present

## 2021-01-08 DIAGNOSIS — E8770 Fluid overload, unspecified: Secondary | ICD-10-CM | POA: Diagnosis not present

## 2021-01-08 DIAGNOSIS — T8249XD Other complication of vascular dialysis catheter, subsequent encounter: Secondary | ICD-10-CM | POA: Diagnosis not present

## 2021-01-08 DIAGNOSIS — Z4931 Encounter for adequacy testing for hemodialysis: Secondary | ICD-10-CM | POA: Diagnosis not present

## 2021-01-08 DIAGNOSIS — D631 Anemia in chronic kidney disease: Secondary | ICD-10-CM | POA: Diagnosis not present

## 2021-01-08 DIAGNOSIS — D472 Monoclonal gammopathy: Secondary | ICD-10-CM | POA: Diagnosis not present

## 2021-01-08 DIAGNOSIS — Z992 Dependence on renal dialysis: Secondary | ICD-10-CM | POA: Diagnosis not present

## 2021-01-08 DIAGNOSIS — N2581 Secondary hyperparathyroidism of renal origin: Secondary | ICD-10-CM | POA: Diagnosis not present

## 2021-01-09 DIAGNOSIS — N2581 Secondary hyperparathyroidism of renal origin: Secondary | ICD-10-CM | POA: Diagnosis not present

## 2021-01-09 DIAGNOSIS — D631 Anemia in chronic kidney disease: Secondary | ICD-10-CM | POA: Diagnosis not present

## 2021-01-09 DIAGNOSIS — E8770 Fluid overload, unspecified: Secondary | ICD-10-CM | POA: Diagnosis not present

## 2021-01-09 DIAGNOSIS — T8249XD Other complication of vascular dialysis catheter, subsequent encounter: Secondary | ICD-10-CM | POA: Diagnosis not present

## 2021-01-09 DIAGNOSIS — Z992 Dependence on renal dialysis: Secondary | ICD-10-CM | POA: Diagnosis not present

## 2021-01-09 DIAGNOSIS — E875 Hyperkalemia: Secondary | ICD-10-CM | POA: Diagnosis not present

## 2021-01-09 DIAGNOSIS — Z4931 Encounter for adequacy testing for hemodialysis: Secondary | ICD-10-CM | POA: Diagnosis not present

## 2021-01-09 DIAGNOSIS — D472 Monoclonal gammopathy: Secondary | ICD-10-CM | POA: Diagnosis not present

## 2021-01-09 DIAGNOSIS — N186 End stage renal disease: Secondary | ICD-10-CM | POA: Diagnosis not present

## 2021-01-12 ENCOUNTER — Encounter: Payer: Self-pay | Admitting: Physician Assistant

## 2021-01-12 DIAGNOSIS — E8770 Fluid overload, unspecified: Secondary | ICD-10-CM | POA: Diagnosis not present

## 2021-01-12 DIAGNOSIS — Z992 Dependence on renal dialysis: Secondary | ICD-10-CM | POA: Diagnosis not present

## 2021-01-12 DIAGNOSIS — N186 End stage renal disease: Secondary | ICD-10-CM | POA: Diagnosis not present

## 2021-01-12 DIAGNOSIS — D472 Monoclonal gammopathy: Secondary | ICD-10-CM | POA: Diagnosis not present

## 2021-01-12 DIAGNOSIS — N2581 Secondary hyperparathyroidism of renal origin: Secondary | ICD-10-CM | POA: Diagnosis not present

## 2021-01-12 DIAGNOSIS — D631 Anemia in chronic kidney disease: Secondary | ICD-10-CM | POA: Diagnosis not present

## 2021-01-12 DIAGNOSIS — E875 Hyperkalemia: Secondary | ICD-10-CM | POA: Diagnosis not present

## 2021-01-12 DIAGNOSIS — T8249XD Other complication of vascular dialysis catheter, subsequent encounter: Secondary | ICD-10-CM | POA: Diagnosis not present

## 2021-01-12 DIAGNOSIS — Z4931 Encounter for adequacy testing for hemodialysis: Secondary | ICD-10-CM | POA: Diagnosis not present

## 2021-01-12 NOTE — Progress Notes (Signed)
Olin Hauser, hemodialysis nurse called office to request letter stating patient's AVF can be accessed. He was last seen on 11/26/2020 for evacuation of hematoma by Dr. Trula Slade. He was not requiring hemodialysis at that time. His fistula was created 6 months ago and second stage performed 09/26/2020. No restrictions to my knowledge to cannulate his fistula.  Barbie Banner, PA-C

## 2021-01-13 DIAGNOSIS — N186 End stage renal disease: Secondary | ICD-10-CM | POA: Diagnosis not present

## 2021-01-13 DIAGNOSIS — E875 Hyperkalemia: Secondary | ICD-10-CM | POA: Diagnosis not present

## 2021-01-13 DIAGNOSIS — D631 Anemia in chronic kidney disease: Secondary | ICD-10-CM | POA: Diagnosis not present

## 2021-01-13 DIAGNOSIS — N2581 Secondary hyperparathyroidism of renal origin: Secondary | ICD-10-CM | POA: Diagnosis not present

## 2021-01-13 DIAGNOSIS — T8249XD Other complication of vascular dialysis catheter, subsequent encounter: Secondary | ICD-10-CM | POA: Diagnosis not present

## 2021-01-13 DIAGNOSIS — E8770 Fluid overload, unspecified: Secondary | ICD-10-CM | POA: Diagnosis not present

## 2021-01-13 DIAGNOSIS — Z4931 Encounter for adequacy testing for hemodialysis: Secondary | ICD-10-CM | POA: Diagnosis not present

## 2021-01-13 DIAGNOSIS — Z992 Dependence on renal dialysis: Secondary | ICD-10-CM | POA: Diagnosis not present

## 2021-01-13 DIAGNOSIS — D472 Monoclonal gammopathy: Secondary | ICD-10-CM | POA: Diagnosis not present

## 2021-01-15 DIAGNOSIS — D631 Anemia in chronic kidney disease: Secondary | ICD-10-CM | POA: Diagnosis not present

## 2021-01-15 DIAGNOSIS — T8249XD Other complication of vascular dialysis catheter, subsequent encounter: Secondary | ICD-10-CM | POA: Diagnosis not present

## 2021-01-15 DIAGNOSIS — Z992 Dependence on renal dialysis: Secondary | ICD-10-CM | POA: Diagnosis not present

## 2021-01-15 DIAGNOSIS — E875 Hyperkalemia: Secondary | ICD-10-CM | POA: Diagnosis not present

## 2021-01-15 DIAGNOSIS — D472 Monoclonal gammopathy: Secondary | ICD-10-CM | POA: Diagnosis not present

## 2021-01-15 DIAGNOSIS — N2581 Secondary hyperparathyroidism of renal origin: Secondary | ICD-10-CM | POA: Diagnosis not present

## 2021-01-15 DIAGNOSIS — E8770 Fluid overload, unspecified: Secondary | ICD-10-CM | POA: Diagnosis not present

## 2021-01-15 DIAGNOSIS — Z4931 Encounter for adequacy testing for hemodialysis: Secondary | ICD-10-CM | POA: Diagnosis not present

## 2021-01-15 DIAGNOSIS — N186 End stage renal disease: Secondary | ICD-10-CM | POA: Diagnosis not present

## 2021-01-16 ENCOUNTER — Encounter (HOSPITAL_COMMUNITY): Payer: Medicare Other

## 2021-01-16 DIAGNOSIS — N2581 Secondary hyperparathyroidism of renal origin: Secondary | ICD-10-CM | POA: Diagnosis not present

## 2021-01-16 DIAGNOSIS — E8770 Fluid overload, unspecified: Secondary | ICD-10-CM | POA: Diagnosis not present

## 2021-01-16 DIAGNOSIS — D631 Anemia in chronic kidney disease: Secondary | ICD-10-CM | POA: Diagnosis not present

## 2021-01-16 DIAGNOSIS — T8249XD Other complication of vascular dialysis catheter, subsequent encounter: Secondary | ICD-10-CM | POA: Diagnosis not present

## 2021-01-16 DIAGNOSIS — Z992 Dependence on renal dialysis: Secondary | ICD-10-CM | POA: Diagnosis not present

## 2021-01-16 DIAGNOSIS — D472 Monoclonal gammopathy: Secondary | ICD-10-CM | POA: Diagnosis not present

## 2021-01-16 DIAGNOSIS — Z4931 Encounter for adequacy testing for hemodialysis: Secondary | ICD-10-CM | POA: Diagnosis not present

## 2021-01-16 DIAGNOSIS — E875 Hyperkalemia: Secondary | ICD-10-CM | POA: Diagnosis not present

## 2021-01-16 DIAGNOSIS — N186 End stage renal disease: Secondary | ICD-10-CM | POA: Diagnosis not present

## 2021-01-19 ENCOUNTER — Ambulatory Visit (INDEPENDENT_AMBULATORY_CARE_PROVIDER_SITE_OTHER): Payer: Medicare Other | Admitting: Surgery

## 2021-01-19 ENCOUNTER — Encounter: Payer: Self-pay | Admitting: Surgery

## 2021-01-19 ENCOUNTER — Other Ambulatory Visit: Payer: Self-pay

## 2021-01-19 ENCOUNTER — Other Ambulatory Visit: Payer: Self-pay | Admitting: Internal Medicine

## 2021-01-19 VITALS — BP 111/72 | HR 75 | Temp 98.1°F | Resp 20 | Ht 74.0 in | Wt 232.0 lb

## 2021-01-19 DIAGNOSIS — N186 End stage renal disease: Secondary | ICD-10-CM

## 2021-01-19 DIAGNOSIS — N2581 Secondary hyperparathyroidism of renal origin: Secondary | ICD-10-CM | POA: Diagnosis not present

## 2021-01-19 DIAGNOSIS — E118 Type 2 diabetes mellitus with unspecified complications: Secondary | ICD-10-CM

## 2021-01-19 DIAGNOSIS — Z4931 Encounter for adequacy testing for hemodialysis: Secondary | ICD-10-CM | POA: Diagnosis not present

## 2021-01-19 DIAGNOSIS — T8249XD Other complication of vascular dialysis catheter, subsequent encounter: Secondary | ICD-10-CM | POA: Diagnosis not present

## 2021-01-19 DIAGNOSIS — Z992 Dependence on renal dialysis: Secondary | ICD-10-CM

## 2021-01-19 DIAGNOSIS — E875 Hyperkalemia: Secondary | ICD-10-CM | POA: Diagnosis not present

## 2021-01-19 DIAGNOSIS — E8770 Fluid overload, unspecified: Secondary | ICD-10-CM | POA: Diagnosis not present

## 2021-01-19 DIAGNOSIS — D631 Anemia in chronic kidney disease: Secondary | ICD-10-CM | POA: Diagnosis not present

## 2021-01-19 DIAGNOSIS — D472 Monoclonal gammopathy: Secondary | ICD-10-CM | POA: Diagnosis not present

## 2021-01-19 DIAGNOSIS — Z794 Long term (current) use of insulin: Secondary | ICD-10-CM
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
## 2021-01-19 NOTE — Telephone Encounter (Signed)
Medication Refill - Medication: lantus long acting needle, patient is completely out  Has the patient contacted their pharmacy? Yes.    (Agent: If yes, when and what did the pharmacy advise?) Contact PCP  Preferred Pharmacy (with phone number or street name):   [place], [address] Phone:  [phone]  Fax:  [phone]       Agent: Please be advised that RX refills may take up to 3 business days. We ask that you follow-up with your pharmacy.

## 2021-01-19 NOTE — Telephone Encounter (Signed)
Requested medication (s) are due for refill today: Yes  Requested medication (s) are on the active medication list: Yes  Last refill:  10/21/18  Future visit scheduled: Yes  Notes to clinic:  Prescription has expired.    Requested Prescriptions  Pending Prescriptions Disp Refills   insulin glargine (LANTUS SOLOSTAR) 100 UNIT/ML Solostar Pen 15 mL 0    Sig: Inject 10 Units into the skin at bedtime.      Endocrinology:  Diabetes - Insulins Failed - 01/19/2021 12:14 PM      Failed - Valid encounter within last 6 months    Recent Outpatient Visits           9 months ago Controlled type 2 diabetes mellitus with stage 5 chronic kidney disease not on chronic dialysis, with long-term current use of insulin ([place])   [place] Mark [name] B, MD   1 year ago Controlled type 2 diabetes mellitus with stage 5 chronic kidney disease not on chronic dialysis, with long-term current use of insulin ([place])   [place] Mark Pier, MD   1 year ago Controlled type 2 diabetes mellitus with stage 5 chronic kidney disease not on chronic dialysis, with long-term current use of insulin ([place])   [place] Mark Pier, MD   1 year ago Need for influenza vaccination   [place], Mark [name], RPH-CPP   1 year ago Controlled type 2 diabetes mellitus with stage 5 chronic kidney disease not on chronic dialysis, with long-term current use of insulin ([place])   [place], MD       Future Appointments             In 2 weeks Mark Pier, MD [place] - HBA1C is between 0 and 7.9 and within 180 days    HbA1c, POC (prediabetic range)  Date Value Ref Range Status  06/22/2019 6.0 5.7 - 6.4 % Final   HbA1c, POC (controlled diabetic range)  Date Value Ref  Range Status  12/27/2019 6.8 0.0 - 7.0 % Final   Hgb A1c MFr Bld  Date Value Ref Range Status  11/26/2020 6.6 (H) 4.8 - 5.6 % Final    Comment:    (NOTE) Pre diabetes:          5.7%-6.4%  Diabetes:              >6.4%  Glycemic control for   <7.0% adults with diabetes

## 2021-01-20 DIAGNOSIS — D631 Anemia in chronic kidney disease: Secondary | ICD-10-CM | POA: Diagnosis not present

## 2021-01-20 DIAGNOSIS — D472 Monoclonal gammopathy: Secondary | ICD-10-CM | POA: Diagnosis not present

## 2021-01-20 DIAGNOSIS — E875 Hyperkalemia: Secondary | ICD-10-CM | POA: Diagnosis not present

## 2021-01-20 DIAGNOSIS — N186 End stage renal disease: Secondary | ICD-10-CM | POA: Diagnosis not present

## 2021-01-20 DIAGNOSIS — E8770 Fluid overload, unspecified: Secondary | ICD-10-CM | POA: Diagnosis not present

## 2021-01-20 DIAGNOSIS — Z992 Dependence on renal dialysis: Secondary | ICD-10-CM | POA: Diagnosis not present

## 2021-01-20 DIAGNOSIS — N2581 Secondary hyperparathyroidism of renal origin: Secondary | ICD-10-CM | POA: Diagnosis not present

## 2021-01-20 DIAGNOSIS — T8249XD Other complication of vascular dialysis catheter, subsequent encounter: Secondary | ICD-10-CM | POA: Diagnosis not present

## 2021-01-20 DIAGNOSIS — Z4931 Encounter for adequacy testing for hemodialysis: Secondary | ICD-10-CM | POA: Diagnosis not present

## 2021-01-20 MED ORDER — LANTUS SOLOSTAR 100 UNIT/ML ~~LOC~~ SOPN: 10.0000 [IU] | PEN_INJECTOR | Freq: Every day | SUBCUTANEOUS | 0 refills | Status: DC

## 2021-01-22 DIAGNOSIS — T8249XD Other complication of vascular dialysis catheter, subsequent encounter: Secondary | ICD-10-CM | POA: Diagnosis not present

## 2021-01-22 DIAGNOSIS — D472 Monoclonal gammopathy: Secondary | ICD-10-CM | POA: Diagnosis not present

## 2021-01-22 DIAGNOSIS — E8770 Fluid overload, unspecified: Secondary | ICD-10-CM | POA: Diagnosis not present

## 2021-01-22 DIAGNOSIS — N2581 Secondary hyperparathyroidism of renal origin: Secondary | ICD-10-CM | POA: Diagnosis not present

## 2021-01-22 DIAGNOSIS — N186 End stage renal disease: Secondary | ICD-10-CM | POA: Diagnosis not present

## 2021-01-22 DIAGNOSIS — Z4931 Encounter for adequacy testing for hemodialysis: Secondary | ICD-10-CM | POA: Diagnosis not present

## 2021-01-22 DIAGNOSIS — D631 Anemia in chronic kidney disease: Secondary | ICD-10-CM | POA: Diagnosis not present

## 2021-01-22 DIAGNOSIS — E875 Hyperkalemia: Secondary | ICD-10-CM | POA: Diagnosis not present

## 2021-01-22 DIAGNOSIS — Z992 Dependence on renal dialysis: Secondary | ICD-10-CM | POA: Diagnosis not present

## 2021-01-23 DIAGNOSIS — D472 Monoclonal gammopathy: Secondary | ICD-10-CM | POA: Diagnosis not present

## 2021-01-23 DIAGNOSIS — E8770 Fluid overload, unspecified: Secondary | ICD-10-CM | POA: Diagnosis not present

## 2021-01-23 DIAGNOSIS — E875 Hyperkalemia: Secondary | ICD-10-CM | POA: Diagnosis not present

## 2021-01-23 DIAGNOSIS — Z992 Dependence on renal dialysis: Secondary | ICD-10-CM | POA: Diagnosis not present

## 2021-01-23 DIAGNOSIS — N186 End stage renal disease: Secondary | ICD-10-CM | POA: Diagnosis not present

## 2021-01-23 DIAGNOSIS — D631 Anemia in chronic kidney disease: Secondary | ICD-10-CM | POA: Diagnosis not present

## 2021-01-23 DIAGNOSIS — T8249XD Other complication of vascular dialysis catheter, subsequent encounter: Secondary | ICD-10-CM | POA: Diagnosis not present

## 2021-01-23 DIAGNOSIS — N2581 Secondary hyperparathyroidism of renal origin: Secondary | ICD-10-CM | POA: Diagnosis not present

## 2021-01-23 DIAGNOSIS — Z4931 Encounter for adequacy testing for hemodialysis: Secondary | ICD-10-CM | POA: Diagnosis not present

## 2021-01-26 DIAGNOSIS — D472 Monoclonal gammopathy: Secondary | ICD-10-CM | POA: Diagnosis not present

## 2021-01-26 DIAGNOSIS — N2581 Secondary hyperparathyroidism of renal origin: Secondary | ICD-10-CM | POA: Diagnosis not present

## 2021-01-26 DIAGNOSIS — N186 End stage renal disease: Secondary | ICD-10-CM | POA: Diagnosis not present

## 2021-01-26 DIAGNOSIS — E8770 Fluid overload, unspecified: Secondary | ICD-10-CM | POA: Diagnosis not present

## 2021-01-26 DIAGNOSIS — D631 Anemia in chronic kidney disease: Secondary | ICD-10-CM | POA: Diagnosis not present

## 2021-01-26 DIAGNOSIS — Z4931 Encounter for adequacy testing for hemodialysis: Secondary | ICD-10-CM | POA: Diagnosis not present

## 2021-01-26 DIAGNOSIS — T8249XD Other complication of vascular dialysis catheter, subsequent encounter: Secondary | ICD-10-CM | POA: Diagnosis not present

## 2021-01-26 DIAGNOSIS — E875 Hyperkalemia: Secondary | ICD-10-CM | POA: Diagnosis not present

## 2021-01-26 DIAGNOSIS — Z992 Dependence on renal dialysis: Secondary | ICD-10-CM | POA: Diagnosis not present

## 2021-01-27 DIAGNOSIS — D472 Monoclonal gammopathy: Secondary | ICD-10-CM | POA: Diagnosis not present

## 2021-01-27 DIAGNOSIS — N186 End stage renal disease: Secondary | ICD-10-CM | POA: Diagnosis not present

## 2021-01-27 DIAGNOSIS — Z4931 Encounter for adequacy testing for hemodialysis: Secondary | ICD-10-CM | POA: Diagnosis not present

## 2021-01-27 DIAGNOSIS — E875 Hyperkalemia: Secondary | ICD-10-CM | POA: Diagnosis not present

## 2021-01-27 DIAGNOSIS — E1129 Type 2 diabetes mellitus with other diabetic kidney complication: Secondary | ICD-10-CM | POA: Diagnosis not present

## 2021-01-27 DIAGNOSIS — D631 Anemia in chronic kidney disease: Secondary | ICD-10-CM | POA: Diagnosis not present

## 2021-01-27 DIAGNOSIS — N2581 Secondary hyperparathyroidism of renal origin: Secondary | ICD-10-CM | POA: Diagnosis not present

## 2021-01-27 DIAGNOSIS — E8770 Fluid overload, unspecified: Secondary | ICD-10-CM | POA: Diagnosis not present

## 2021-01-27 DIAGNOSIS — T8249XD Other complication of vascular dialysis catheter, subsequent encounter: Secondary | ICD-10-CM | POA: Diagnosis not present

## 2021-01-27 DIAGNOSIS — Z992 Dependence on renal dialysis: Secondary | ICD-10-CM | POA: Diagnosis not present

## 2021-01-29 DIAGNOSIS — D688 Other specified coagulation defects: Secondary | ICD-10-CM | POA: Diagnosis not present

## 2021-01-29 DIAGNOSIS — D631 Anemia in chronic kidney disease: Secondary | ICD-10-CM | POA: Diagnosis not present

## 2021-01-29 DIAGNOSIS — E875 Hyperkalemia: Secondary | ICD-10-CM | POA: Diagnosis not present

## 2021-01-29 DIAGNOSIS — E8770 Fluid overload, unspecified: Secondary | ICD-10-CM | POA: Diagnosis not present

## 2021-01-29 DIAGNOSIS — D509 Iron deficiency anemia, unspecified: Secondary | ICD-10-CM | POA: Diagnosis not present

## 2021-01-29 DIAGNOSIS — D472 Monoclonal gammopathy: Secondary | ICD-10-CM | POA: Diagnosis not present

## 2021-01-29 DIAGNOSIS — T8249XD Other complication of vascular dialysis catheter, subsequent encounter: Secondary | ICD-10-CM | POA: Diagnosis not present

## 2021-01-29 DIAGNOSIS — N2581 Secondary hyperparathyroidism of renal origin: Secondary | ICD-10-CM | POA: Diagnosis not present

## 2021-01-29 DIAGNOSIS — N186 End stage renal disease: Secondary | ICD-10-CM | POA: Diagnosis not present

## 2021-01-29 DIAGNOSIS — Z992 Dependence on renal dialysis: Secondary | ICD-10-CM | POA: Diagnosis not present

## 2021-01-30 DIAGNOSIS — D631 Anemia in chronic kidney disease: Secondary | ICD-10-CM | POA: Diagnosis not present

## 2021-01-30 DIAGNOSIS — Z992 Dependence on renal dialysis: Secondary | ICD-10-CM | POA: Diagnosis not present

## 2021-01-30 DIAGNOSIS — D509 Iron deficiency anemia, unspecified: Secondary | ICD-10-CM | POA: Diagnosis not present

## 2021-01-30 DIAGNOSIS — T8249XD Other complication of vascular dialysis catheter, subsequent encounter: Secondary | ICD-10-CM | POA: Diagnosis not present

## 2021-01-30 DIAGNOSIS — E875 Hyperkalemia: Secondary | ICD-10-CM | POA: Diagnosis not present

## 2021-01-30 DIAGNOSIS — E8770 Fluid overload, unspecified: Secondary | ICD-10-CM | POA: Diagnosis not present

## 2021-01-30 DIAGNOSIS — N2581 Secondary hyperparathyroidism of renal origin: Secondary | ICD-10-CM | POA: Diagnosis not present

## 2021-01-30 DIAGNOSIS — D688 Other specified coagulation defects: Secondary | ICD-10-CM | POA: Diagnosis not present

## 2021-01-30 DIAGNOSIS — N186 End stage renal disease: Secondary | ICD-10-CM | POA: Diagnosis not present

## 2021-01-30 DIAGNOSIS — D472 Monoclonal gammopathy: Secondary | ICD-10-CM | POA: Diagnosis not present

## 2021-02-02 DIAGNOSIS — N2581 Secondary hyperparathyroidism of renal origin: Secondary | ICD-10-CM | POA: Diagnosis not present

## 2021-02-02 DIAGNOSIS — E8770 Fluid overload, unspecified: Secondary | ICD-10-CM | POA: Diagnosis not present

## 2021-02-02 DIAGNOSIS — D509 Iron deficiency anemia, unspecified: Secondary | ICD-10-CM | POA: Diagnosis not present

## 2021-02-02 DIAGNOSIS — E875 Hyperkalemia: Secondary | ICD-10-CM | POA: Diagnosis not present

## 2021-02-02 DIAGNOSIS — D631 Anemia in chronic kidney disease: Secondary | ICD-10-CM | POA: Diagnosis not present

## 2021-02-02 DIAGNOSIS — T8249XD Other complication of vascular dialysis catheter, subsequent encounter: Secondary | ICD-10-CM | POA: Diagnosis not present

## 2021-02-02 DIAGNOSIS — Z992 Dependence on renal dialysis: Secondary | ICD-10-CM | POA: Diagnosis not present

## 2021-02-02 DIAGNOSIS — N186 End stage renal disease: Secondary | ICD-10-CM | POA: Diagnosis not present

## 2021-02-02 DIAGNOSIS — D688 Other specified coagulation defects: Secondary | ICD-10-CM | POA: Diagnosis not present

## 2021-02-02 DIAGNOSIS — D472 Monoclonal gammopathy: Secondary | ICD-10-CM | POA: Diagnosis not present

## 2021-02-03 ENCOUNTER — Other Ambulatory Visit: Payer: Self-pay

## 2021-02-03 ENCOUNTER — Encounter: Payer: Self-pay | Admitting: Internal Medicine

## 2021-02-03 ENCOUNTER — Ambulatory Visit: Payer: Medicare Other | Attending: Internal Medicine | Admitting: Internal Medicine

## 2021-02-03 VITALS — BP 128/78 | HR 84 | Resp 16 | Wt 234.0 lb

## 2021-02-03 DIAGNOSIS — N186 End stage renal disease: Secondary | ICD-10-CM

## 2021-02-03 DIAGNOSIS — E1159 Type 2 diabetes mellitus with other circulatory complications: Secondary | ICD-10-CM | POA: Diagnosis not present

## 2021-02-03 DIAGNOSIS — I5032 Chronic diastolic (congestive) heart failure: Secondary | ICD-10-CM | POA: Diagnosis not present

## 2021-02-03 DIAGNOSIS — Z992 Dependence on renal dialysis: Secondary | ICD-10-CM | POA: Diagnosis not present

## 2021-02-03 DIAGNOSIS — E875 Hyperkalemia: Secondary | ICD-10-CM | POA: Diagnosis not present

## 2021-02-03 DIAGNOSIS — T8249XD Other complication of vascular dialysis catheter, subsequent encounter: Secondary | ICD-10-CM | POA: Diagnosis not present

## 2021-02-03 DIAGNOSIS — G40909 Epilepsy, unspecified, not intractable, without status epilepticus: Secondary | ICD-10-CM

## 2021-02-03 DIAGNOSIS — I152 Hypertension secondary to endocrine disorders: Secondary | ICD-10-CM

## 2021-02-03 DIAGNOSIS — Z794 Long term (current) use of insulin: Secondary | ICD-10-CM | POA: Diagnosis not present

## 2021-02-03 DIAGNOSIS — Z89429 Acquired absence of other toe(s), unspecified side: Secondary | ICD-10-CM | POA: Diagnosis not present

## 2021-02-03 DIAGNOSIS — N2581 Secondary hyperparathyroidism of renal origin: Secondary | ICD-10-CM | POA: Diagnosis not present

## 2021-02-03 DIAGNOSIS — E8770 Fluid overload, unspecified: Secondary | ICD-10-CM | POA: Diagnosis not present

## 2021-02-03 DIAGNOSIS — D688 Other specified coagulation defects: Secondary | ICD-10-CM | POA: Diagnosis not present

## 2021-02-03 DIAGNOSIS — I739 Peripheral vascular disease, unspecified: Secondary | ICD-10-CM | POA: Diagnosis not present

## 2021-02-03 DIAGNOSIS — Z23 Encounter for immunization: Secondary | ICD-10-CM

## 2021-02-03 DIAGNOSIS — Z89512 Acquired absence of left leg below knee: Secondary | ICD-10-CM | POA: Diagnosis not present

## 2021-02-03 DIAGNOSIS — D631 Anemia in chronic kidney disease: Secondary | ICD-10-CM

## 2021-02-03 DIAGNOSIS — D509 Iron deficiency anemia, unspecified: Secondary | ICD-10-CM | POA: Diagnosis not present

## 2021-02-03 DIAGNOSIS — D472 Monoclonal gammopathy: Secondary | ICD-10-CM | POA: Diagnosis not present

## 2021-02-03 LAB — GLUCOSE, POCT (MANUAL RESULT ENTRY): POC Glucose: 205 mg/dl — AB (ref 70–99)

## 2021-02-03 NOTE — Patient Instructions (Addendum)
Increase Lantus insulin to 17 units daily.  Continue to check blood sugars. You should continue the atorvastatin for cholesterol.  I will send an updated prescription to your pharmacy. We will plan to give the shingles vaccine on your next visit.  Pneumococcal Polysaccharide Vaccine (PPSV23): What You Need to Know 1. Why get vaccinated? Pneumococcal polysaccharide vaccine (PPSV23) can prevent pneumococcal disease. Pneumococcal disease refers to any illness caused by pneumococcal bacteria. These bacteria can cause many types of illnesses, including pneumonia, which is an infection of the lungs. Pneumococcal bacteria are one of the most common causes of pneumonia. Besides pneumonia, pneumococcal bacteria can also cause:  Ear infections  Sinus infections  Meningitis (infection of the tissue covering the brain and spinal cord)  Bacteremia (bloodstream infection) Anyone can get pneumococcal disease, but children under 52 years of age, people with certain medical conditions, adults 18 years or older, and cigarette smokers are at the highest risk. Most pneumococcal infections are mild. However, some can result in long-term problems, such as brain damage or hearing loss. Meningitis, bacteremia, and pneumonia caused by pneumococcal disease can be fatal. 2. PPSV23 PPSV23 protects against 23 types of bacteria that cause pneumococcal disease. PPSV23 is recommended for:  All adults 2 years or older,  Anyone 2 years or older with certain medical conditions that can lead to an increased risk for pneumococcal disease. Most people need only one dose of PPSV23. A second dose of PPSV23, and another type of pneumococcal vaccine called PCV13, are recommended for certain high-risk groups. Your health care provider can give you more information. People 65 years or older should get a dose of PPSV23 even if they have already gotten one or more doses of the vaccine before they turned 50. 3. Talk with your health  care provider Tell your vaccine provider if the person getting the vaccine:  Has had an allergic reaction after a previous dose of PPSV23, or has any severe, life-threatening allergies. In some cases, your health care provider may decide to postpone PPSV23 vaccination to a future visit. People with minor illnesses, such as a cold, may be vaccinated. People who are moderately or severely ill should usually wait until they recover before getting PPSV23. Your health care provider can give you more information. 4. Risks of a vaccine reaction  Redness or pain where the shot is given, feeling tired, fever, or muscle aches can happen after PPSV23. People sometimes faint after medical procedures, including vaccination. Tell your provider if you feel dizzy or have vision changes or ringing in the ears. As with any medicine, there is a very remote chance of a vaccine causing a severe allergic reaction, other serious injury, or death. 5. What if there is a serious problem? An allergic reaction could occur after the vaccinated person leaves the clinic. If you see signs of a severe allergic reaction (hives, swelling of the face and throat, difficulty breathing, a fast heartbeat, dizziness, or weakness), call 9-1-1 and get the person to the nearest hospital. For other signs that concern you, call your health care provider. Adverse reactions should be reported to the Vaccine Adverse Event Reporting System (VAERS). Your health care provider will usually file this report, or you can do it yourself. Visit the VAERS website at www.vaers.SamedayNews.es or call 937-679-3662. VAERS is only for reporting reactions, and VAERS staff do not give medical advice. 6. How can I learn more?  Ask your health care provider.  Call your local or state health department.  Contact the Centers  for Disease Control and Prevention (CDC): ? Call 404-425-0623 (1-800-CDC-INFO) or ? Visit CDC's website at http://hunter.com/ Vaccine  Information Statement PPSV23 Vaccine (06/28/2018) This information is not intended to replace advice given to you by your health care provider. Make sure you discuss any questions you have with your health care provider. Document Revised: 04/18/2020 Document Reviewed: 04/18/2020 Elsevier Patient Education  [place].
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
## 2021-02-04 LAB — HEPATIC FUNCTION PANEL
ALT: 7 IU/L (ref 0–44)
AST: 11 IU/L (ref 0–40)
Albumin: 3.9 g/dL (ref 3.8–4.8)
Alkaline Phosphatase: 83 IU/L (ref 44–121)
Bilirubin Total: <0.2 mg/dL (ref 0.0–1.2)
Bilirubin, Direct: <0.1 mg/dL (ref 0.00–0.40)
Total Protein: 7.9 g/dL (ref 6.0–8.5)

## 2021-02-05 ENCOUNTER — Telehealth: Payer: Self-pay

## 2021-02-05 DIAGNOSIS — Z992 Dependence on renal dialysis: Secondary | ICD-10-CM | POA: Diagnosis not present

## 2021-02-05 DIAGNOSIS — D509 Iron deficiency anemia, unspecified: Secondary | ICD-10-CM | POA: Diagnosis not present

## 2021-02-05 DIAGNOSIS — N186 End stage renal disease: Secondary | ICD-10-CM | POA: Diagnosis not present

## 2021-02-05 DIAGNOSIS — I5042 Chronic combined systolic (congestive) and diastolic (congestive) heart failure: Secondary | ICD-10-CM

## 2021-02-05 DIAGNOSIS — D472 Monoclonal gammopathy: Secondary | ICD-10-CM | POA: Diagnosis not present

## 2021-02-05 DIAGNOSIS — N2581 Secondary hyperparathyroidism of renal origin: Secondary | ICD-10-CM | POA: Diagnosis not present

## 2021-02-05 DIAGNOSIS — T8249XD Other complication of vascular dialysis catheter, subsequent encounter: Secondary | ICD-10-CM | POA: Diagnosis not present

## 2021-02-05 DIAGNOSIS — D688 Other specified coagulation defects: Secondary | ICD-10-CM | POA: Diagnosis not present

## 2021-02-05 DIAGNOSIS — E8770 Fluid overload, unspecified: Secondary | ICD-10-CM | POA: Diagnosis not present

## 2021-02-05 DIAGNOSIS — E875 Hyperkalemia: Secondary | ICD-10-CM | POA: Diagnosis not present

## 2021-02-05 DIAGNOSIS — D631 Anemia in chronic kidney disease: Secondary | ICD-10-CM | POA: Diagnosis not present

## 2021-02-05 MED ORDER — ATORVASTATIN CALCIUM 20 MG PO TABS: 1.0000 | ORAL_TABLET | Freq: Every day | ORAL | 1 refills | Status: AC

## 2021-02-05 NOTE — Telephone Encounter (Signed)
Contacted pt to go over lab results pt didn't answer lvm  

## 2021-02-05 NOTE — Telephone Encounter (Signed)
Pt is needing updated rx

## 2021-02-05 NOTE — Telephone Encounter (Signed)
Patient called back in for lab results. Per nurse okay to let patient know that his labs were normal and that he could re-start his atorvastatin. Patient understood and had no further questions, however, did state he needed an updated rx sent to the [facility] on [address]. Please follow up

## 2021-02-05 NOTE — Addendum Note (Signed)
Addended by: Karle Plumber B on: 02/05/2021 11:05 PM   Modules accepted: Orders

## 2021-02-06 DIAGNOSIS — D688 Other specified coagulation defects: Secondary | ICD-10-CM | POA: Diagnosis not present

## 2021-02-06 DIAGNOSIS — E8770 Fluid overload, unspecified: Secondary | ICD-10-CM | POA: Diagnosis not present

## 2021-02-06 DIAGNOSIS — N186 End stage renal disease: Secondary | ICD-10-CM | POA: Diagnosis not present

## 2021-02-06 DIAGNOSIS — D472 Monoclonal gammopathy: Secondary | ICD-10-CM | POA: Diagnosis not present

## 2021-02-06 DIAGNOSIS — D631 Anemia in chronic kidney disease: Secondary | ICD-10-CM | POA: Diagnosis not present

## 2021-02-06 DIAGNOSIS — T8249XD Other complication of vascular dialysis catheter, subsequent encounter: Secondary | ICD-10-CM | POA: Diagnosis not present

## 2021-02-06 DIAGNOSIS — Z992 Dependence on renal dialysis: Secondary | ICD-10-CM | POA: Diagnosis not present

## 2021-02-06 DIAGNOSIS — D509 Iron deficiency anemia, unspecified: Secondary | ICD-10-CM | POA: Diagnosis not present

## 2021-02-06 DIAGNOSIS — E875 Hyperkalemia: Secondary | ICD-10-CM | POA: Diagnosis not present

## 2021-02-06 DIAGNOSIS — N2581 Secondary hyperparathyroidism of renal origin: Secondary | ICD-10-CM | POA: Diagnosis not present

## 2021-02-09 ENCOUNTER — Telehealth: Payer: Self-pay | Admitting: Surgery

## 2021-02-09 DIAGNOSIS — E875 Hyperkalemia: Secondary | ICD-10-CM | POA: Diagnosis not present

## 2021-02-09 DIAGNOSIS — D688 Other specified coagulation defects: Secondary | ICD-10-CM | POA: Diagnosis not present

## 2021-02-09 DIAGNOSIS — N186 End stage renal disease: Secondary | ICD-10-CM | POA: Diagnosis not present

## 2021-02-09 DIAGNOSIS — D631 Anemia in chronic kidney disease: Secondary | ICD-10-CM | POA: Diagnosis not present

## 2021-02-09 DIAGNOSIS — D509 Iron deficiency anemia, unspecified: Secondary | ICD-10-CM | POA: Diagnosis not present

## 2021-02-09 DIAGNOSIS — D472 Monoclonal gammopathy: Secondary | ICD-10-CM | POA: Diagnosis not present

## 2021-02-09 DIAGNOSIS — Z992 Dependence on renal dialysis: Secondary | ICD-10-CM | POA: Diagnosis not present

## 2021-02-09 DIAGNOSIS — N2581 Secondary hyperparathyroidism of renal origin: Secondary | ICD-10-CM | POA: Diagnosis not present

## 2021-02-09 DIAGNOSIS — E8770 Fluid overload, unspecified: Secondary | ICD-10-CM | POA: Diagnosis not present

## 2021-02-09 DIAGNOSIS — T8249XD Other complication of vascular dialysis catheter, subsequent encounter: Secondary | ICD-10-CM | POA: Diagnosis not present

## 2021-02-09 NOTE — Telephone Encounter (Signed)
Spoke with patient tonight regarding PD catheter placement.  With his extensive prior abdominal surgery, I think it is best if we do not proceed.  He was in agreement  WB

## 2021-02-10 DIAGNOSIS — D688 Other specified coagulation defects: Secondary | ICD-10-CM | POA: Diagnosis not present

## 2021-02-10 DIAGNOSIS — D472 Monoclonal gammopathy: Secondary | ICD-10-CM | POA: Diagnosis not present

## 2021-02-10 DIAGNOSIS — N2581 Secondary hyperparathyroidism of renal origin: Secondary | ICD-10-CM | POA: Diagnosis not present

## 2021-02-10 DIAGNOSIS — D631 Anemia in chronic kidney disease: Secondary | ICD-10-CM | POA: Diagnosis not present

## 2021-02-10 DIAGNOSIS — E8770 Fluid overload, unspecified: Secondary | ICD-10-CM | POA: Diagnosis not present

## 2021-02-10 DIAGNOSIS — E875 Hyperkalemia: Secondary | ICD-10-CM | POA: Diagnosis not present

## 2021-02-10 DIAGNOSIS — T8249XD Other complication of vascular dialysis catheter, subsequent encounter: Secondary | ICD-10-CM | POA: Diagnosis not present

## 2021-02-10 DIAGNOSIS — D509 Iron deficiency anemia, unspecified: Secondary | ICD-10-CM | POA: Diagnosis not present

## 2021-02-10 DIAGNOSIS — Z992 Dependence on renal dialysis: Secondary | ICD-10-CM | POA: Diagnosis not present

## 2021-02-10 DIAGNOSIS — N186 End stage renal disease: Secondary | ICD-10-CM | POA: Diagnosis not present

## 2021-02-11 ENCOUNTER — Ambulatory Visit (HOSPITAL_COMMUNITY): Admission: RE | Admit: 2021-02-11 | Payer: Medicare Other | Source: Home / Self Care | Admitting: Surgery

## 2021-02-11 ENCOUNTER — Encounter (HOSPITAL_COMMUNITY): Admission: RE | Payer: Self-pay | Source: Home / Self Care

## 2021-02-11 SURGERY — LAPAROSCOPIC INSERTION CONTINUOUS AMBULATORY PERITONEAL DIALYSIS  (CAPD) CATHETER
Anesthesia: Choice

## 2021-02-12 DIAGNOSIS — N184 Chronic kidney disease, stage 4 (severe): Secondary | ICD-10-CM | POA: Diagnosis not present

## 2021-02-12 DIAGNOSIS — E8770 Fluid overload, unspecified: Secondary | ICD-10-CM | POA: Diagnosis not present

## 2021-02-12 DIAGNOSIS — D631 Anemia in chronic kidney disease: Secondary | ICD-10-CM | POA: Diagnosis not present

## 2021-02-12 DIAGNOSIS — M7541 Impingement syndrome of right shoulder: Secondary | ICD-10-CM | POA: Diagnosis not present

## 2021-02-12 DIAGNOSIS — Z992 Dependence on renal dialysis: Secondary | ICD-10-CM | POA: Diagnosis not present

## 2021-02-12 DIAGNOSIS — T8249XD Other complication of vascular dialysis catheter, subsequent encounter: Secondary | ICD-10-CM | POA: Diagnosis not present

## 2021-02-12 DIAGNOSIS — M7542 Impingement syndrome of left shoulder: Secondary | ICD-10-CM | POA: Diagnosis not present

## 2021-02-12 DIAGNOSIS — D472 Monoclonal gammopathy: Secondary | ICD-10-CM | POA: Diagnosis not present

## 2021-02-12 DIAGNOSIS — N2581 Secondary hyperparathyroidism of renal origin: Secondary | ICD-10-CM | POA: Diagnosis not present

## 2021-02-12 DIAGNOSIS — I5032 Chronic diastolic (congestive) heart failure: Secondary | ICD-10-CM | POA: Diagnosis not present

## 2021-02-12 DIAGNOSIS — E875 Hyperkalemia: Secondary | ICD-10-CM | POA: Diagnosis not present

## 2021-02-12 DIAGNOSIS — S88119A Complete traumatic amputation at level between knee and ankle, unspecified lower leg, initial encounter: Secondary | ICD-10-CM | POA: Diagnosis not present

## 2021-02-12 DIAGNOSIS — N186 End stage renal disease: Secondary | ICD-10-CM | POA: Diagnosis not present

## 2021-02-12 DIAGNOSIS — D509 Iron deficiency anemia, unspecified: Secondary | ICD-10-CM | POA: Diagnosis not present

## 2021-02-12 DIAGNOSIS — D688 Other specified coagulation defects: Secondary | ICD-10-CM | POA: Diagnosis not present

## 2021-02-13 ENCOUNTER — Encounter (HOSPITAL_COMMUNITY): Payer: Medicare Other

## 2021-02-13 ENCOUNTER — Other Ambulatory Visit: Payer: Self-pay | Admitting: Internal Medicine

## 2021-02-13 DIAGNOSIS — D688 Other specified coagulation defects: Secondary | ICD-10-CM | POA: Diagnosis not present

## 2021-02-13 DIAGNOSIS — D509 Iron deficiency anemia, unspecified: Secondary | ICD-10-CM | POA: Diagnosis not present

## 2021-02-13 DIAGNOSIS — N2581 Secondary hyperparathyroidism of renal origin: Secondary | ICD-10-CM | POA: Diagnosis not present

## 2021-02-13 DIAGNOSIS — N186 End stage renal disease: Secondary | ICD-10-CM | POA: Diagnosis not present

## 2021-02-13 DIAGNOSIS — E875 Hyperkalemia: Secondary | ICD-10-CM | POA: Diagnosis not present

## 2021-02-13 DIAGNOSIS — D631 Anemia in chronic kidney disease: Secondary | ICD-10-CM | POA: Diagnosis not present

## 2021-02-13 DIAGNOSIS — Z794 Long term (current) use of insulin: Secondary | ICD-10-CM

## 2021-02-13 DIAGNOSIS — E8770 Fluid overload, unspecified: Secondary | ICD-10-CM | POA: Diagnosis not present

## 2021-02-13 DIAGNOSIS — T8249XD Other complication of vascular dialysis catheter, subsequent encounter: Secondary | ICD-10-CM | POA: Diagnosis not present

## 2021-02-13 DIAGNOSIS — D472 Monoclonal gammopathy: Secondary | ICD-10-CM | POA: Diagnosis not present

## 2021-02-13 DIAGNOSIS — Z992 Dependence on renal dialysis: Secondary | ICD-10-CM | POA: Diagnosis not present

## 2021-02-13 MED ORDER — LANTUS SOLOSTAR 100 UNIT/ML ~~LOC~~ SOPN: 17.0000 [IU] | PEN_INJECTOR | Freq: Every day | SUBCUTANEOUS | 0 refills | Status: DC

## 2021-02-13 NOTE — Telephone Encounter (Signed)
Medication: insulin glargine (LANTUS SOLOSTAR) 100 UNIT/ML Solostar Pen [[id number]]   Has the patient contacted their pharmacy? YES  (Agent: If no, request that the patient contact the pharmacy for the refill.) (Agent: If yes, when and what did the pharmacy advise?)  Preferred Pharmacy (with phone number or street name): [place], [place] [address] Phone: [phone] Fax: [phone] Hours: Not open 24 hours    Agent: Please be advised that RX refills may take up to 3 business days. We ask that you follow-up with your pharmacy.

## 2021-02-16 DIAGNOSIS — Z452 Encounter for adjustment and management of vascular access device: Secondary | ICD-10-CM | POA: Diagnosis not present

## 2021-02-16 DIAGNOSIS — N186 End stage renal disease: Secondary | ICD-10-CM | POA: Diagnosis not present

## 2021-02-16 DIAGNOSIS — Z992 Dependence on renal dialysis: Secondary | ICD-10-CM | POA: Diagnosis not present

## 2021-02-17 DIAGNOSIS — N2581 Secondary hyperparathyroidism of renal origin: Secondary | ICD-10-CM | POA: Diagnosis not present

## 2021-02-17 DIAGNOSIS — D631 Anemia in chronic kidney disease: Secondary | ICD-10-CM | POA: Diagnosis not present

## 2021-02-17 DIAGNOSIS — Z992 Dependence on renal dialysis: Secondary | ICD-10-CM | POA: Diagnosis not present

## 2021-02-17 DIAGNOSIS — D472 Monoclonal gammopathy: Secondary | ICD-10-CM | POA: Diagnosis not present

## 2021-02-17 DIAGNOSIS — E875 Hyperkalemia: Secondary | ICD-10-CM | POA: Diagnosis not present

## 2021-02-17 DIAGNOSIS — D509 Iron deficiency anemia, unspecified: Secondary | ICD-10-CM | POA: Diagnosis not present

## 2021-02-17 DIAGNOSIS — T8249XD Other complication of vascular dialysis catheter, subsequent encounter: Secondary | ICD-10-CM | POA: Diagnosis not present

## 2021-02-17 DIAGNOSIS — D688 Other specified coagulation defects: Secondary | ICD-10-CM | POA: Diagnosis not present

## 2021-02-17 DIAGNOSIS — E8770 Fluid overload, unspecified: Secondary | ICD-10-CM | POA: Diagnosis not present

## 2021-02-17 DIAGNOSIS — N186 End stage renal disease: Secondary | ICD-10-CM | POA: Diagnosis not present

## 2021-02-19 ENCOUNTER — Telehealth: Payer: Self-pay | Admitting: Internal Medicine

## 2021-02-19 DIAGNOSIS — N2581 Secondary hyperparathyroidism of renal origin: Secondary | ICD-10-CM | POA: Diagnosis not present

## 2021-02-19 DIAGNOSIS — E875 Hyperkalemia: Secondary | ICD-10-CM | POA: Diagnosis not present

## 2021-02-19 DIAGNOSIS — T8249XD Other complication of vascular dialysis catheter, subsequent encounter: Secondary | ICD-10-CM | POA: Diagnosis not present

## 2021-02-19 DIAGNOSIS — D472 Monoclonal gammopathy: Secondary | ICD-10-CM | POA: Diagnosis not present

## 2021-02-19 DIAGNOSIS — D688 Other specified coagulation defects: Secondary | ICD-10-CM | POA: Diagnosis not present

## 2021-02-19 DIAGNOSIS — E8770 Fluid overload, unspecified: Secondary | ICD-10-CM | POA: Diagnosis not present

## 2021-02-19 DIAGNOSIS — D631 Anemia in chronic kidney disease: Secondary | ICD-10-CM | POA: Diagnosis not present

## 2021-02-19 DIAGNOSIS — D509 Iron deficiency anemia, unspecified: Secondary | ICD-10-CM | POA: Diagnosis not present

## 2021-02-19 DIAGNOSIS — N186 End stage renal disease: Secondary | ICD-10-CM | POA: Diagnosis not present

## 2021-02-19 DIAGNOSIS — Z992 Dependence on renal dialysis: Secondary | ICD-10-CM | POA: Diagnosis not present

## 2021-02-19 MED ORDER — TRUEPLUS 5-BEVEL PEN NEEDLES 32G X 4 MM MISC: 2 refills | Status: AC

## 2021-02-19 NOTE — Telephone Encounter (Signed)
Copied from [place] [id number]. Topic: General - Other >> Feb 18, 2021  4:09 PM Bayard Beaver wrote: Reason for CRM: patient calling becaue he says he tried to pick up his pen needle and is being told he has to pay for it. Please call back in regards to this.

## 2021-02-19 NOTE — Telephone Encounter (Signed)
His last rx for pen needles was sent in 2019. He did not have an active rx on file. I sent one for him.

## 2021-02-21 DIAGNOSIS — D509 Iron deficiency anemia, unspecified: Secondary | ICD-10-CM | POA: Diagnosis not present

## 2021-02-21 DIAGNOSIS — N2581 Secondary hyperparathyroidism of renal origin: Secondary | ICD-10-CM | POA: Diagnosis not present

## 2021-02-21 DIAGNOSIS — T8249XD Other complication of vascular dialysis catheter, subsequent encounter: Secondary | ICD-10-CM | POA: Diagnosis not present

## 2021-02-21 DIAGNOSIS — E875 Hyperkalemia: Secondary | ICD-10-CM | POA: Diagnosis not present

## 2021-02-21 DIAGNOSIS — E8770 Fluid overload, unspecified: Secondary | ICD-10-CM | POA: Diagnosis not present

## 2021-02-21 DIAGNOSIS — D631 Anemia in chronic kidney disease: Secondary | ICD-10-CM | POA: Diagnosis not present

## 2021-02-21 DIAGNOSIS — D472 Monoclonal gammopathy: Secondary | ICD-10-CM | POA: Diagnosis not present

## 2021-02-21 DIAGNOSIS — D688 Other specified coagulation defects: Secondary | ICD-10-CM | POA: Diagnosis not present

## 2021-02-21 DIAGNOSIS — Z992 Dependence on renal dialysis: Secondary | ICD-10-CM | POA: Diagnosis not present

## 2021-02-21 DIAGNOSIS — N186 End stage renal disease: Secondary | ICD-10-CM | POA: Diagnosis not present

## 2021-02-24 DIAGNOSIS — Z992 Dependence on renal dialysis: Secondary | ICD-10-CM | POA: Diagnosis not present

## 2021-02-24 DIAGNOSIS — N186 End stage renal disease: Secondary | ICD-10-CM | POA: Diagnosis not present

## 2021-02-24 DIAGNOSIS — D472 Monoclonal gammopathy: Secondary | ICD-10-CM | POA: Diagnosis not present

## 2021-02-24 DIAGNOSIS — D688 Other specified coagulation defects: Secondary | ICD-10-CM | POA: Diagnosis not present

## 2021-02-24 DIAGNOSIS — D631 Anemia in chronic kidney disease: Secondary | ICD-10-CM | POA: Diagnosis not present

## 2021-02-24 DIAGNOSIS — T8249XD Other complication of vascular dialysis catheter, subsequent encounter: Secondary | ICD-10-CM | POA: Diagnosis not present

## 2021-02-24 DIAGNOSIS — D509 Iron deficiency anemia, unspecified: Secondary | ICD-10-CM | POA: Diagnosis not present

## 2021-02-24 DIAGNOSIS — E8770 Fluid overload, unspecified: Secondary | ICD-10-CM | POA: Diagnosis not present

## 2021-02-24 DIAGNOSIS — N2581 Secondary hyperparathyroidism of renal origin: Secondary | ICD-10-CM | POA: Diagnosis not present

## 2021-02-24 DIAGNOSIS — E875 Hyperkalemia: Secondary | ICD-10-CM | POA: Diagnosis not present

## 2021-02-25 ENCOUNTER — Ambulatory Visit: Payer: Medicare Other | Admitting: Pharmacist

## 2021-02-26 DIAGNOSIS — T8249XD Other complication of vascular dialysis catheter, subsequent encounter: Secondary | ICD-10-CM | POA: Diagnosis not present

## 2021-02-26 DIAGNOSIS — D472 Monoclonal gammopathy: Secondary | ICD-10-CM | POA: Diagnosis not present

## 2021-02-26 DIAGNOSIS — N186 End stage renal disease: Secondary | ICD-10-CM | POA: Diagnosis not present

## 2021-02-26 DIAGNOSIS — E875 Hyperkalemia: Secondary | ICD-10-CM | POA: Diagnosis not present

## 2021-02-26 DIAGNOSIS — N2581 Secondary hyperparathyroidism of renal origin: Secondary | ICD-10-CM | POA: Diagnosis not present

## 2021-02-26 DIAGNOSIS — D688 Other specified coagulation defects: Secondary | ICD-10-CM | POA: Diagnosis not present

## 2021-02-26 DIAGNOSIS — D631 Anemia in chronic kidney disease: Secondary | ICD-10-CM | POA: Diagnosis not present

## 2021-02-26 DIAGNOSIS — D509 Iron deficiency anemia, unspecified: Secondary | ICD-10-CM | POA: Diagnosis not present

## 2021-02-26 DIAGNOSIS — E8770 Fluid overload, unspecified: Secondary | ICD-10-CM | POA: Diagnosis not present

## 2021-02-26 DIAGNOSIS — Z992 Dependence on renal dialysis: Secondary | ICD-10-CM | POA: Diagnosis not present

## 2021-02-26 DIAGNOSIS — E1129 Type 2 diabetes mellitus with other diabetic kidney complication: Secondary | ICD-10-CM | POA: Diagnosis not present

## 2021-02-28 DIAGNOSIS — Z992 Dependence on renal dialysis: Secondary | ICD-10-CM | POA: Diagnosis not present

## 2021-02-28 DIAGNOSIS — D631 Anemia in chronic kidney disease: Secondary | ICD-10-CM | POA: Diagnosis not present

## 2021-02-28 DIAGNOSIS — D472 Monoclonal gammopathy: Secondary | ICD-10-CM | POA: Diagnosis not present

## 2021-02-28 DIAGNOSIS — N186 End stage renal disease: Secondary | ICD-10-CM | POA: Diagnosis not present

## 2021-02-28 DIAGNOSIS — N2581 Secondary hyperparathyroidism of renal origin: Secondary | ICD-10-CM | POA: Diagnosis not present

## 2021-02-28 DIAGNOSIS — D509 Iron deficiency anemia, unspecified: Secondary | ICD-10-CM | POA: Diagnosis not present

## 2021-02-28 DIAGNOSIS — D688 Other specified coagulation defects: Secondary | ICD-10-CM | POA: Diagnosis not present

## 2021-03-03 DIAGNOSIS — Z992 Dependence on renal dialysis: Secondary | ICD-10-CM | POA: Diagnosis not present

## 2021-03-03 DIAGNOSIS — N2581 Secondary hyperparathyroidism of renal origin: Secondary | ICD-10-CM | POA: Diagnosis not present

## 2021-03-03 DIAGNOSIS — N186 End stage renal disease: Secondary | ICD-10-CM | POA: Diagnosis not present

## 2021-03-03 DIAGNOSIS — D509 Iron deficiency anemia, unspecified: Secondary | ICD-10-CM | POA: Diagnosis not present

## 2021-03-03 DIAGNOSIS — D472 Monoclonal gammopathy: Secondary | ICD-10-CM | POA: Diagnosis not present

## 2021-03-03 DIAGNOSIS — D631 Anemia in chronic kidney disease: Secondary | ICD-10-CM | POA: Diagnosis not present

## 2021-03-03 DIAGNOSIS — D688 Other specified coagulation defects: Secondary | ICD-10-CM | POA: Diagnosis not present

## 2021-03-05 DIAGNOSIS — N2581 Secondary hyperparathyroidism of renal origin: Secondary | ICD-10-CM | POA: Diagnosis not present

## 2021-03-05 DIAGNOSIS — D688 Other specified coagulation defects: Secondary | ICD-10-CM | POA: Diagnosis not present

## 2021-03-05 DIAGNOSIS — D509 Iron deficiency anemia, unspecified: Secondary | ICD-10-CM | POA: Diagnosis not present

## 2021-03-05 DIAGNOSIS — Z992 Dependence on renal dialysis: Secondary | ICD-10-CM | POA: Diagnosis not present

## 2021-03-05 DIAGNOSIS — D631 Anemia in chronic kidney disease: Secondary | ICD-10-CM | POA: Diagnosis not present

## 2021-03-05 DIAGNOSIS — N186 End stage renal disease: Secondary | ICD-10-CM | POA: Diagnosis not present

## 2021-03-05 DIAGNOSIS — D472 Monoclonal gammopathy: Secondary | ICD-10-CM | POA: Diagnosis not present

## 2021-03-07 DIAGNOSIS — N186 End stage renal disease: Secondary | ICD-10-CM | POA: Diagnosis not present

## 2021-03-07 DIAGNOSIS — N2581 Secondary hyperparathyroidism of renal origin: Secondary | ICD-10-CM | POA: Diagnosis not present

## 2021-03-07 DIAGNOSIS — Z992 Dependence on renal dialysis: Secondary | ICD-10-CM | POA: Diagnosis not present

## 2021-03-07 DIAGNOSIS — D688 Other specified coagulation defects: Secondary | ICD-10-CM | POA: Diagnosis not present

## 2021-03-07 DIAGNOSIS — D509 Iron deficiency anemia, unspecified: Secondary | ICD-10-CM | POA: Diagnosis not present

## 2021-03-07 DIAGNOSIS — D631 Anemia in chronic kidney disease: Secondary | ICD-10-CM | POA: Diagnosis not present

## 2021-03-07 DIAGNOSIS — D472 Monoclonal gammopathy: Secondary | ICD-10-CM | POA: Diagnosis not present

## 2021-03-10 DIAGNOSIS — D509 Iron deficiency anemia, unspecified: Secondary | ICD-10-CM | POA: Diagnosis not present

## 2021-03-10 DIAGNOSIS — D631 Anemia in chronic kidney disease: Secondary | ICD-10-CM | POA: Diagnosis not present

## 2021-03-10 DIAGNOSIS — D688 Other specified coagulation defects: Secondary | ICD-10-CM | POA: Diagnosis not present

## 2021-03-10 DIAGNOSIS — D472 Monoclonal gammopathy: Secondary | ICD-10-CM | POA: Diagnosis not present

## 2021-03-10 DIAGNOSIS — N186 End stage renal disease: Secondary | ICD-10-CM | POA: Diagnosis not present

## 2021-03-10 DIAGNOSIS — N2581 Secondary hyperparathyroidism of renal origin: Secondary | ICD-10-CM | POA: Diagnosis not present

## 2021-03-10 DIAGNOSIS — Z992 Dependence on renal dialysis: Secondary | ICD-10-CM | POA: Diagnosis not present

## 2021-03-11 ENCOUNTER — Encounter (HOSPITAL_COMMUNITY): Payer: Self-pay | Admitting: Emergency Medicine

## 2021-03-11 ENCOUNTER — Emergency Department (HOSPITAL_COMMUNITY)
Admission: EM | Admit: 2021-03-11 | Discharge: 2021-03-11 | Disposition: A | Discharge status: Home / Self Care | Payer: Medicare Other | Attending: Emergency Medicine | Admitting: Emergency Medicine

## 2021-03-11 ENCOUNTER — Other Ambulatory Visit: Payer: Self-pay

## 2021-03-11 DIAGNOSIS — R531 Weakness: Secondary | ICD-10-CM | POA: Diagnosis present

## 2021-03-11 DIAGNOSIS — I959 Hypotension, unspecified: Secondary | ICD-10-CM | POA: Insufficient documentation

## 2021-03-11 DIAGNOSIS — E1122 Type 2 diabetes mellitus with diabetic chronic kidney disease: Secondary | ICD-10-CM | POA: Diagnosis not present

## 2021-03-11 DIAGNOSIS — Z7982 Long term (current) use of aspirin: Secondary | ICD-10-CM | POA: Diagnosis not present

## 2021-03-11 DIAGNOSIS — Z79899 Other long term (current) drug therapy: Secondary | ICD-10-CM | POA: Diagnosis not present

## 2021-03-11 DIAGNOSIS — Z992 Dependence on renal dialysis: Secondary | ICD-10-CM | POA: Insufficient documentation

## 2021-03-11 DIAGNOSIS — I5032 Chronic diastolic (congestive) heart failure: Secondary | ICD-10-CM | POA: Insufficient documentation

## 2021-03-11 DIAGNOSIS — Z87891 Personal history of nicotine dependence: Secondary | ICD-10-CM | POA: Diagnosis not present

## 2021-03-11 DIAGNOSIS — Z8589 Personal history of malignant neoplasm of other organs and systems: Secondary | ICD-10-CM | POA: Diagnosis not present

## 2021-03-11 DIAGNOSIS — Z794 Long term (current) use of insulin: Secondary | ICD-10-CM | POA: Diagnosis not present

## 2021-03-11 DIAGNOSIS — I132 Hypertensive heart and chronic kidney disease with heart failure and with stage 5 chronic kidney disease, or end stage renal disease: Secondary | ICD-10-CM | POA: Insufficient documentation

## 2021-03-11 DIAGNOSIS — N186 End stage renal disease: Secondary | ICD-10-CM | POA: Diagnosis not present

## 2021-03-11 LAB — BASIC METABOLIC PANEL
Anion gap: 14 (ref 5–15)
BUN: 52 mg/dL — ABNORMAL HIGH (ref 8–23)
CO2: 29 mmol/L (ref 22–32)
Calcium: 9.1 mg/dL (ref 8.9–10.3)
Chloride: 96 mmol/L — ABNORMAL LOW (ref 98–111)
Creatinine, Ser: 9.03 mg/dL — ABNORMAL HIGH (ref 0.61–1.24)
GFR, Estimated: 6 mL/min — ABNORMAL LOW (ref >60)
Glucose, Bld: 155 mg/dL — ABNORMAL HIGH (ref 70–99)
Potassium: 3.7 mmol/L (ref 3.5–5.1)
Sodium: 139 mmol/L (ref 135–145)

## 2021-03-11 LAB — CBC
HCT: 32.5 % — ABNORMAL LOW (ref 39.0–52.0)
Hemoglobin: 10.3 g/dL — ABNORMAL LOW (ref 13.0–17.0)
MCH: 29.9 pg (ref 26.0–34.0)
MCHC: 31.7 g/dL (ref 30.0–36.0)
MCV: 94.2 fL (ref 80.0–100.0)
Platelets: 369 10*3/uL (ref 150–400)
RBC: 3.45 MIL/uL — ABNORMAL LOW (ref 4.22–5.81)
RDW: 16.5 % — ABNORMAL HIGH (ref 11.5–15.5)
WBC: 8.1 10*3/uL (ref 4.0–10.5)
nRBC: 0 % (ref 0.0–0.2)

## 2021-03-11 NOTE — ED Triage Notes (Signed)
Pt arrives POV with complaints of hypotension. Pt states his BP this morning when checked was 91/53 and was 110/60. Pt states that is low for him and that he feels weaker than usual. Pt states he has been struggling with low BP since starting dialysis in April.  BP in triage 115/62

## 2021-03-11 NOTE — Discharge Instructions (Addendum)
Continue dialysis as scheduled on Tuesday Thursdays and Saturdays.  Make an appointment follow-up with your doctor to discuss some of the low blood pressure concerns.  And as well as some of the symptoms that you had existing prior to April.  Return for any new or worse symptoms.  Nephrology made aware of your low blood pressure concerns.  And they will talk to your dialysis center.  Work-up here without any acute findings.  Your blood pressure has been stable.  And labs were without any significant abnormalities.
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
## 2021-03-12 DIAGNOSIS — Z992 Dependence on renal dialysis: Secondary | ICD-10-CM | POA: Diagnosis not present

## 2021-03-12 DIAGNOSIS — N2581 Secondary hyperparathyroidism of renal origin: Secondary | ICD-10-CM | POA: Diagnosis not present

## 2021-03-12 DIAGNOSIS — D509 Iron deficiency anemia, unspecified: Secondary | ICD-10-CM | POA: Diagnosis not present

## 2021-03-12 DIAGNOSIS — D472 Monoclonal gammopathy: Secondary | ICD-10-CM | POA: Diagnosis not present

## 2021-03-12 DIAGNOSIS — D631 Anemia in chronic kidney disease: Secondary | ICD-10-CM | POA: Diagnosis not present

## 2021-03-12 DIAGNOSIS — D688 Other specified coagulation defects: Secondary | ICD-10-CM | POA: Diagnosis not present

## 2021-03-12 DIAGNOSIS — N186 End stage renal disease: Secondary | ICD-10-CM | POA: Diagnosis not present

## 2021-03-14 DIAGNOSIS — N2581 Secondary hyperparathyroidism of renal origin: Secondary | ICD-10-CM | POA: Diagnosis not present

## 2021-03-14 DIAGNOSIS — N186 End stage renal disease: Secondary | ICD-10-CM | POA: Diagnosis not present

## 2021-03-14 DIAGNOSIS — Z992 Dependence on renal dialysis: Secondary | ICD-10-CM | POA: Diagnosis not present

## 2021-03-14 DIAGNOSIS — D631 Anemia in chronic kidney disease: Secondary | ICD-10-CM | POA: Diagnosis not present

## 2021-03-14 DIAGNOSIS — D509 Iron deficiency anemia, unspecified: Secondary | ICD-10-CM | POA: Diagnosis not present

## 2021-03-14 DIAGNOSIS — D472 Monoclonal gammopathy: Secondary | ICD-10-CM | POA: Diagnosis not present

## 2021-03-14 DIAGNOSIS — D688 Other specified coagulation defects: Secondary | ICD-10-CM | POA: Diagnosis not present

## 2021-03-15 ENCOUNTER — Other Ambulatory Visit: Payer: Self-pay | Admitting: Internal Medicine

## 2021-03-15 DIAGNOSIS — Z794 Long term (current) use of insulin: Secondary | ICD-10-CM

## 2021-03-15 NOTE — Telephone Encounter (Signed)
Requested Prescriptions  Pending Prescriptions Disp Refills  . glucose blood (ONETOUCH VERIO) test strip [Pharmacy Med Name: OneTouch Verio In Vitro Strip] 100 each 11    Sig: USE AS DIRECTED THREE TIMES DAILY TO  TEST  BLOOD  SUGAR     Endocrinology: Diabetes - Testing Supplies Passed - 03/15/2021  5:05 PM      Passed - Valid encounter within last 12 months    Recent Outpatient Visits          1 month ago Type 2 diabetes mellitus with other circulatory complication, with long-term current use of insulin ([place])   [facility], Neoma Laming B, MD   11 months ago Controlled type 2 diabetes mellitus with stage 5 chronic kidney disease not on chronic dialysis, with long-term current use of insulin ([place])   [place], Neoma Laming B, MD   1 year ago Controlled type 2 diabetes mellitus with stage 5 chronic kidney disease not on chronic dialysis, with long-term current use of insulin ([place])   [place], Neoma Laming B, MD   1 year ago Controlled type 2 diabetes mellitus with stage 5 chronic kidney disease not on chronic dialysis, with long-term current use of insulin [facility])   [place] [name], MD   1 year ago Need for influenza vaccination   [place], RPH-CPP      Future Appointments            In 3 months Charlott Rakes, MD [facility]

## 2021-03-16 ENCOUNTER — Ambulatory Visit: Payer: Self-pay | Admitting: *Deleted

## 2021-03-16 NOTE — Telephone Encounter (Signed)
Patient called back and c/o pain in side mouth and clarified "cyst" in upper right side of mouth.  Pain x 3 days denies fever. Reports he is unable to eat or drink since Saturday . Hx dialysis and patient very agitated. Instructed patient to go to ED . Patient upset NT had no other advise to relieve pain. Care advise given. Patient verbalized understanding of care advise and to go to ED now. Unsure of disposition.   Reason for Disposition  [1] Drinking very little AND [2] dehydration suspected (e.g., no urine > 12 hours, very dry mouth, very lightheaded)  Answer Assessment - Initial Assessment Questions 1. LOCATION: "Where is the ulcer located?"      *No Answer* 2. NUMBER: "How many ulcers are there?"      *No Answer* 3. SIZE: "How large is the ulcer?"      *No Answer* 4. SEVERITY: "Are they painful?" If Yes, ask: "How bad is it?"  (Scale 1-10; or mild, moderate, severe)  - MILD - eating  and drinking normally   - MODERATE - decreased liquid intake   - SEVERE - drinking very little      *No Answer* 5. ONSET: "When did you first notice the ulcer?"      *No Answer* 6. RECURRENT SYMPTOM: "Have you had a mouth ulcer before?" If Yes, ask: "When was the last time?" and "What happened that time?"      *No Answer* 7. CAUSE: "What do you think is causing the mouth ulcer?"     *No Answer* 8. OTHER SYMPTOMS: "Do you have any other symptoms?" (e.g., fever)     *No Answer* 9. PREGNANCY: "Is there any chance you are pregnant?" "When was your last menstrual period?"     *No Answer*  Answer Assessment - Initial Assessment Questions 1. ONSET: "When did the mouth start hurting?" (e.g., hours or days ago)      3 days ago  2. SEVERITY: "How bad is the pain?" (Scale 1-10; mild, moderate or severe)   - MILD (1-3):  doesn't interfere with eating or normal activities   - MODERATE (4-7): interferes with eating some solids and normal activities   - SEVERE (8-10):  excruciating pain, interferes with most  normal activities   - SEVERE DYSPHAGIA: can't swallow liquids, drooling     Can not eat or drink due to pain 3. SORES: "Are there any sores or ulcers in the mouth?" If Yes, ask: "What part of the mouth are the sores in?"     Sore in mouth upper right side of mouth  4. FEVER: "Do you have a fever?" If Yes, ask: "What is your temperature, how was it measured, and when did it start?"     Denies  5. CAUSE: "What do you think is causing the mouth pain?"     "Cyst" in mouth  6. OTHER SYMPTOMS: "Do you have any other symptoms?" (e.g., difficulty breathing)     Difficulty eating and drinking  Protocols used: Mouth Ulcers-A-AH, Mouth Pain-A-AH

## 2021-03-16 NOTE — Telephone Encounter (Signed)
Pt called in asking for an antibiotic for a cyst he has on his lip, pt wanted some advice on what to do since he needs to be seen before any medications can be given.   Called patient to review symptoms. No answer, LVMTCB.

## 2021-03-17 ENCOUNTER — Emergency Department (HOSPITAL_COMMUNITY)
Admission: EM | Admit: 2021-03-17 | Discharge: 2021-03-18 | Disposition: A | Discharge status: Home / Self Care | Payer: Medicare Other | Attending: Emergency Medicine | Admitting: Emergency Medicine

## 2021-03-17 DIAGNOSIS — E1151 Type 2 diabetes mellitus with diabetic peripheral angiopathy without gangrene: Secondary | ICD-10-CM | POA: Diagnosis not present

## 2021-03-17 DIAGNOSIS — K122 Cellulitis and abscess of mouth: Secondary | ICD-10-CM | POA: Diagnosis not present

## 2021-03-17 DIAGNOSIS — E113219 Type 2 diabetes mellitus with mild nonproliferative diabetic retinopathy with macular edema, unspecified eye: Secondary | ICD-10-CM | POA: Insufficient documentation

## 2021-03-17 DIAGNOSIS — E1122 Type 2 diabetes mellitus with diabetic chronic kidney disease: Secondary | ICD-10-CM | POA: Insufficient documentation

## 2021-03-17 DIAGNOSIS — I132 Hypertensive heart and chronic kidney disease with heart failure and with stage 5 chronic kidney disease, or end stage renal disease: Secondary | ICD-10-CM | POA: Insufficient documentation

## 2021-03-17 DIAGNOSIS — N186 End stage renal disease: Secondary | ICD-10-CM | POA: Insufficient documentation

## 2021-03-17 DIAGNOSIS — Z79899 Other long term (current) drug therapy: Secondary | ICD-10-CM | POA: Insufficient documentation

## 2021-03-17 DIAGNOSIS — R220 Localized swelling, mass and lump, head: Secondary | ICD-10-CM | POA: Insufficient documentation

## 2021-03-17 DIAGNOSIS — Z87891 Personal history of nicotine dependence: Secondary | ICD-10-CM | POA: Insufficient documentation

## 2021-03-17 DIAGNOSIS — Z5321 Procedure and treatment not carried out due to patient leaving prior to being seen by health care provider: Secondary | ICD-10-CM | POA: Insufficient documentation

## 2021-03-17 DIAGNOSIS — D472 Monoclonal gammopathy: Secondary | ICD-10-CM | POA: Diagnosis not present

## 2021-03-17 DIAGNOSIS — E1129 Type 2 diabetes mellitus with other diabetic kidney complication: Secondary | ICD-10-CM | POA: Diagnosis not present

## 2021-03-17 DIAGNOSIS — K1379 Other lesions of oral mucosa: Secondary | ICD-10-CM | POA: Insufficient documentation

## 2021-03-17 DIAGNOSIS — N2581 Secondary hyperparathyroidism of renal origin: Secondary | ICD-10-CM | POA: Diagnosis not present

## 2021-03-17 DIAGNOSIS — Z8579 Personal history of other malignant neoplasms of lymphoid, hematopoietic and related tissues: Secondary | ICD-10-CM | POA: Diagnosis not present

## 2021-03-17 DIAGNOSIS — Z794 Long term (current) use of insulin: Secondary | ICD-10-CM | POA: Insufficient documentation

## 2021-03-17 DIAGNOSIS — D631 Anemia in chronic kidney disease: Secondary | ICD-10-CM | POA: Diagnosis not present

## 2021-03-17 DIAGNOSIS — I5032 Chronic diastolic (congestive) heart failure: Secondary | ICD-10-CM | POA: Diagnosis not present

## 2021-03-17 DIAGNOSIS — Z992 Dependence on renal dialysis: Secondary | ICD-10-CM | POA: Insufficient documentation

## 2021-03-17 DIAGNOSIS — D688 Other specified coagulation defects: Secondary | ICD-10-CM | POA: Diagnosis not present

## 2021-03-17 DIAGNOSIS — R6884 Jaw pain: Secondary | ICD-10-CM | POA: Diagnosis not present

## 2021-03-17 DIAGNOSIS — D509 Iron deficiency anemia, unspecified: Secondary | ICD-10-CM | POA: Diagnosis not present

## 2021-03-18 ENCOUNTER — Encounter (HOSPITAL_COMMUNITY): Payer: Self-pay

## 2021-03-18 ENCOUNTER — Emergency Department (HOSPITAL_COMMUNITY)
Admission: EM | Admit: 2021-03-18 | Discharge: 2021-03-18 | Disposition: A | Discharge status: Home / Self Care | Payer: Medicare Other | Source: Home / Self Care | Attending: Emergency Medicine | Admitting: Emergency Medicine

## 2021-03-18 DIAGNOSIS — I132 Hypertensive heart and chronic kidney disease with heart failure and with stage 5 chronic kidney disease, or end stage renal disease: Secondary | ICD-10-CM | POA: Insufficient documentation

## 2021-03-18 DIAGNOSIS — E1122 Type 2 diabetes mellitus with diabetic chronic kidney disease: Secondary | ICD-10-CM | POA: Insufficient documentation

## 2021-03-18 DIAGNOSIS — R220 Localized swelling, mass and lump, head: Secondary | ICD-10-CM | POA: Insufficient documentation

## 2021-03-18 DIAGNOSIS — Z8579 Personal history of other malignant neoplasms of lymphoid, hematopoietic and related tissues: Secondary | ICD-10-CM | POA: Insufficient documentation

## 2021-03-18 DIAGNOSIS — Z992 Dependence on renal dialysis: Secondary | ICD-10-CM | POA: Insufficient documentation

## 2021-03-18 DIAGNOSIS — K1379 Other lesions of oral mucosa: Secondary | ICD-10-CM | POA: Insufficient documentation

## 2021-03-18 DIAGNOSIS — E113219 Type 2 diabetes mellitus with mild nonproliferative diabetic retinopathy with macular edema, unspecified eye: Secondary | ICD-10-CM | POA: Insufficient documentation

## 2021-03-18 DIAGNOSIS — Z7982 Long term (current) use of aspirin: Secondary | ICD-10-CM | POA: Insufficient documentation

## 2021-03-18 DIAGNOSIS — E1129 Type 2 diabetes mellitus with other diabetic kidney complication: Secondary | ICD-10-CM | POA: Insufficient documentation

## 2021-03-18 DIAGNOSIS — Z79899 Other long term (current) drug therapy: Secondary | ICD-10-CM | POA: Insufficient documentation

## 2021-03-18 DIAGNOSIS — E1151 Type 2 diabetes mellitus with diabetic peripheral angiopathy without gangrene: Secondary | ICD-10-CM | POA: Insufficient documentation

## 2021-03-18 DIAGNOSIS — Z87891 Personal history of nicotine dependence: Secondary | ICD-10-CM | POA: Insufficient documentation

## 2021-03-18 DIAGNOSIS — I5032 Chronic diastolic (congestive) heart failure: Secondary | ICD-10-CM | POA: Insufficient documentation

## 2021-03-18 DIAGNOSIS — Z794 Long term (current) use of insulin: Secondary | ICD-10-CM | POA: Insufficient documentation

## 2021-03-18 DIAGNOSIS — N186 End stage renal disease: Secondary | ICD-10-CM | POA: Insufficient documentation

## 2021-03-18 DIAGNOSIS — K122 Cellulitis and abscess of mouth: Secondary | ICD-10-CM | POA: Diagnosis not present

## 2021-03-18 DIAGNOSIS — R6884 Jaw pain: Secondary | ICD-10-CM | POA: Diagnosis not present

## 2021-03-18 MED ORDER — PENICILLIN V POTASSIUM 500 MG PO TABS: 500.0000 mg | ORAL_TABLET | Freq: Two times a day (BID) | ORAL | 0 refills | Status: AC

## 2021-03-18 NOTE — Telephone Encounter (Signed)
Will forward to covering provider. Pt went to the ED yesterday 03/17/21 and left without being triaged

## 2021-03-18 NOTE — Discharge Instructions (Addendum)
I prescribed you penicillin for the regions of swelling on your cheeks.  You can take this twice a day for the next week.  I would also recommend over-the-counter medications such as Orajel.  This is a pain relieving cream you can apply to the regions.  I would also recommend salt water gargles 2-3 times a day.  If you develop worsening pain, difficulty swallowing, fevers, vomiting, please come back to the emergency department for reevaluation.  Otherwise, please follow-up with your doctor next week at your scheduled appointment.  It was a pleasure to meet you.
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
## 2021-03-18 NOTE — ED Triage Notes (Addendum)
"  cyst" in upper right side of mouth/gum area from dentures.  Pain x 5 days. Reports he is unable to eat or drink since Saturday and now has additional sore forming on left side.   Reports dentist appointment next week. Unable to wait due to pain and not eating.   Hx dialysis   A/ox4 Ambulatory in triage.

## 2021-03-18 NOTE — Telephone Encounter (Signed)
Please refer to the mobile unit as that will provide faster access.

## 2021-03-19 DIAGNOSIS — D688 Other specified coagulation defects: Secondary | ICD-10-CM | POA: Diagnosis not present

## 2021-03-19 DIAGNOSIS — D631 Anemia in chronic kidney disease: Secondary | ICD-10-CM | POA: Diagnosis not present

## 2021-03-19 DIAGNOSIS — N186 End stage renal disease: Secondary | ICD-10-CM | POA: Diagnosis not present

## 2021-03-19 DIAGNOSIS — Z992 Dependence on renal dialysis: Secondary | ICD-10-CM | POA: Diagnosis not present

## 2021-03-19 DIAGNOSIS — D472 Monoclonal gammopathy: Secondary | ICD-10-CM | POA: Diagnosis not present

## 2021-03-19 DIAGNOSIS — N2581 Secondary hyperparathyroidism of renal origin: Secondary | ICD-10-CM | POA: Diagnosis not present

## 2021-03-19 DIAGNOSIS — D509 Iron deficiency anemia, unspecified: Secondary | ICD-10-CM | POA: Diagnosis not present

## 2021-03-19 NOTE — Telephone Encounter (Signed)
Called patient no answer. Left vm advising patient to either go to urgent care or visit our mobile unit. If any questions to call [phone]

## 2021-03-19 NOTE — Telephone Encounter (Signed)
Please contact pt and make aware of Dr. Margarita Rana response. Also pt has urgent care

## 2021-03-21 DIAGNOSIS — N186 End stage renal disease: Secondary | ICD-10-CM | POA: Diagnosis not present

## 2021-03-21 DIAGNOSIS — D472 Monoclonal gammopathy: Secondary | ICD-10-CM | POA: Diagnosis not present

## 2021-03-21 DIAGNOSIS — D688 Other specified coagulation defects: Secondary | ICD-10-CM | POA: Diagnosis not present

## 2021-03-21 DIAGNOSIS — D631 Anemia in chronic kidney disease: Secondary | ICD-10-CM | POA: Diagnosis not present

## 2021-03-21 DIAGNOSIS — D509 Iron deficiency anemia, unspecified: Secondary | ICD-10-CM | POA: Diagnosis not present

## 2021-03-21 DIAGNOSIS — Z992 Dependence on renal dialysis: Secondary | ICD-10-CM | POA: Diagnosis not present

## 2021-03-21 DIAGNOSIS — N2581 Secondary hyperparathyroidism of renal origin: Secondary | ICD-10-CM | POA: Diagnosis not present

## 2021-03-24 ENCOUNTER — Emergency Department (HOSPITAL_COMMUNITY)
Admission: EM | Admit: 2021-03-24 | Discharge: 2021-03-25 | Disposition: A | Discharge status: Home / Self Care | Payer: Medicare Other | Attending: Emergency Medicine | Admitting: Emergency Medicine

## 2021-03-24 ENCOUNTER — Encounter (HOSPITAL_COMMUNITY): Payer: Self-pay

## 2021-03-24 ENCOUNTER — Other Ambulatory Visit: Payer: Self-pay

## 2021-03-24 DIAGNOSIS — Z992 Dependence on renal dialysis: Secondary | ICD-10-CM | POA: Diagnosis not present

## 2021-03-24 DIAGNOSIS — R6889 Other general symptoms and signs: Secondary | ICD-10-CM | POA: Diagnosis not present

## 2021-03-24 DIAGNOSIS — G4489 Other headache syndrome: Secondary | ICD-10-CM | POA: Diagnosis not present

## 2021-03-24 DIAGNOSIS — N2581 Secondary hyperparathyroidism of renal origin: Secondary | ICD-10-CM | POA: Diagnosis not present

## 2021-03-24 DIAGNOSIS — N186 End stage renal disease: Secondary | ICD-10-CM | POA: Diagnosis not present

## 2021-03-24 DIAGNOSIS — D688 Other specified coagulation defects: Secondary | ICD-10-CM | POA: Diagnosis not present

## 2021-03-24 DIAGNOSIS — Z743 Need for continuous supervision: Secondary | ICD-10-CM | POA: Diagnosis not present

## 2021-03-24 DIAGNOSIS — R232 Flushing: Secondary | ICD-10-CM | POA: Insufficient documentation

## 2021-03-24 DIAGNOSIS — Z5321 Procedure and treatment not carried out due to patient leaving prior to being seen by health care provider: Secondary | ICD-10-CM | POA: Diagnosis not present

## 2021-03-24 DIAGNOSIS — D509 Iron deficiency anemia, unspecified: Secondary | ICD-10-CM | POA: Diagnosis not present

## 2021-03-24 DIAGNOSIS — D631 Anemia in chronic kidney disease: Secondary | ICD-10-CM | POA: Diagnosis not present

## 2021-03-24 DIAGNOSIS — D472 Monoclonal gammopathy: Secondary | ICD-10-CM | POA: Diagnosis not present

## 2021-03-24 DIAGNOSIS — R739 Hyperglycemia, unspecified: Secondary | ICD-10-CM | POA: Diagnosis not present

## 2021-03-24 NOTE — ED Triage Notes (Signed)
Pt BIB EMS because his home is too hot for him and he is a dialysis pt. Last dialysis treatment was this morning.

## 2021-03-26 DIAGNOSIS — D631 Anemia in chronic kidney disease: Secondary | ICD-10-CM | POA: Diagnosis not present

## 2021-03-26 DIAGNOSIS — D509 Iron deficiency anemia, unspecified: Secondary | ICD-10-CM | POA: Diagnosis not present

## 2021-03-26 DIAGNOSIS — D472 Monoclonal gammopathy: Secondary | ICD-10-CM | POA: Diagnosis not present

## 2021-03-26 DIAGNOSIS — N2581 Secondary hyperparathyroidism of renal origin: Secondary | ICD-10-CM | POA: Diagnosis not present

## 2021-03-26 DIAGNOSIS — D688 Other specified coagulation defects: Secondary | ICD-10-CM | POA: Diagnosis not present

## 2021-03-26 DIAGNOSIS — Z992 Dependence on renal dialysis: Secondary | ICD-10-CM | POA: Diagnosis not present

## 2021-03-26 DIAGNOSIS — N186 End stage renal disease: Secondary | ICD-10-CM | POA: Diagnosis not present

## 2021-03-28 DIAGNOSIS — Z992 Dependence on renal dialysis: Secondary | ICD-10-CM | POA: Diagnosis not present

## 2021-03-28 DIAGNOSIS — D688 Other specified coagulation defects: Secondary | ICD-10-CM | POA: Diagnosis not present

## 2021-03-28 DIAGNOSIS — N186 End stage renal disease: Secondary | ICD-10-CM | POA: Diagnosis not present

## 2021-03-28 DIAGNOSIS — D472 Monoclonal gammopathy: Secondary | ICD-10-CM | POA: Diagnosis not present

## 2021-03-28 DIAGNOSIS — D631 Anemia in chronic kidney disease: Secondary | ICD-10-CM | POA: Diagnosis not present

## 2021-03-28 DIAGNOSIS — D509 Iron deficiency anemia, unspecified: Secondary | ICD-10-CM | POA: Diagnosis not present

## 2021-03-28 DIAGNOSIS — N2581 Secondary hyperparathyroidism of renal origin: Secondary | ICD-10-CM | POA: Diagnosis not present

## 2021-03-29 DIAGNOSIS — Z992 Dependence on renal dialysis: Secondary | ICD-10-CM | POA: Diagnosis not present

## 2021-03-29 DIAGNOSIS — E1129 Type 2 diabetes mellitus with other diabetic kidney complication: Secondary | ICD-10-CM | POA: Diagnosis not present

## 2021-03-29 DIAGNOSIS — N186 End stage renal disease: Secondary | ICD-10-CM | POA: Diagnosis not present

## 2021-03-31 DIAGNOSIS — D631 Anemia in chronic kidney disease: Secondary | ICD-10-CM | POA: Diagnosis not present

## 2021-03-31 DIAGNOSIS — D472 Monoclonal gammopathy: Secondary | ICD-10-CM | POA: Diagnosis not present

## 2021-03-31 DIAGNOSIS — D688 Other specified coagulation defects: Secondary | ICD-10-CM | POA: Diagnosis not present

## 2021-03-31 DIAGNOSIS — D509 Iron deficiency anemia, unspecified: Secondary | ICD-10-CM | POA: Diagnosis not present

## 2021-03-31 DIAGNOSIS — Z992 Dependence on renal dialysis: Secondary | ICD-10-CM | POA: Diagnosis not present

## 2021-03-31 DIAGNOSIS — N186 End stage renal disease: Secondary | ICD-10-CM | POA: Diagnosis not present

## 2021-03-31 DIAGNOSIS — N2581 Secondary hyperparathyroidism of renal origin: Secondary | ICD-10-CM | POA: Diagnosis not present

## 2021-04-02 ENCOUNTER — Inpatient Hospital Stay: Payer: Medicare Other | Attending: Internal Medicine

## 2021-04-02 DIAGNOSIS — D472 Monoclonal gammopathy: Secondary | ICD-10-CM | POA: Insufficient documentation

## 2021-04-02 DIAGNOSIS — D631 Anemia in chronic kidney disease: Secondary | ICD-10-CM | POA: Insufficient documentation

## 2021-04-02 DIAGNOSIS — D688 Other specified coagulation defects: Secondary | ICD-10-CM | POA: Diagnosis not present

## 2021-04-02 DIAGNOSIS — E1122 Type 2 diabetes mellitus with diabetic chronic kidney disease: Secondary | ICD-10-CM | POA: Insufficient documentation

## 2021-04-02 DIAGNOSIS — D509 Iron deficiency anemia, unspecified: Secondary | ICD-10-CM | POA: Diagnosis not present

## 2021-04-02 DIAGNOSIS — I132 Hypertensive heart and chronic kidney disease with heart failure and with stage 5 chronic kidney disease, or end stage renal disease: Secondary | ICD-10-CM | POA: Insufficient documentation

## 2021-04-02 DIAGNOSIS — N186 End stage renal disease: Secondary | ICD-10-CM | POA: Diagnosis not present

## 2021-04-02 DIAGNOSIS — N2581 Secondary hyperparathyroidism of renal origin: Secondary | ICD-10-CM | POA: Diagnosis not present

## 2021-04-02 DIAGNOSIS — Z992 Dependence on renal dialysis: Secondary | ICD-10-CM | POA: Diagnosis not present

## 2021-04-04 DIAGNOSIS — N186 End stage renal disease: Secondary | ICD-10-CM | POA: Diagnosis not present

## 2021-04-04 DIAGNOSIS — D472 Monoclonal gammopathy: Secondary | ICD-10-CM | POA: Diagnosis not present

## 2021-04-04 DIAGNOSIS — D688 Other specified coagulation defects: Secondary | ICD-10-CM | POA: Diagnosis not present

## 2021-04-04 DIAGNOSIS — N2581 Secondary hyperparathyroidism of renal origin: Secondary | ICD-10-CM | POA: Diagnosis not present

## 2021-04-04 DIAGNOSIS — D509 Iron deficiency anemia, unspecified: Secondary | ICD-10-CM | POA: Diagnosis not present

## 2021-04-04 DIAGNOSIS — Z992 Dependence on renal dialysis: Secondary | ICD-10-CM | POA: Diagnosis not present

## 2021-04-04 DIAGNOSIS — D631 Anemia in chronic kidney disease: Secondary | ICD-10-CM | POA: Diagnosis not present

## 2021-04-07 DIAGNOSIS — N2581 Secondary hyperparathyroidism of renal origin: Secondary | ICD-10-CM | POA: Diagnosis not present

## 2021-04-07 DIAGNOSIS — N186 End stage renal disease: Secondary | ICD-10-CM | POA: Diagnosis not present

## 2021-04-07 DIAGNOSIS — D631 Anemia in chronic kidney disease: Secondary | ICD-10-CM | POA: Diagnosis not present

## 2021-04-07 DIAGNOSIS — Z992 Dependence on renal dialysis: Secondary | ICD-10-CM | POA: Diagnosis not present

## 2021-04-07 DIAGNOSIS — D688 Other specified coagulation defects: Secondary | ICD-10-CM | POA: Diagnosis not present

## 2021-04-07 DIAGNOSIS — D509 Iron deficiency anemia, unspecified: Secondary | ICD-10-CM | POA: Diagnosis not present

## 2021-04-07 DIAGNOSIS — D472 Monoclonal gammopathy: Secondary | ICD-10-CM | POA: Diagnosis not present

## 2021-04-09 ENCOUNTER — Inpatient Hospital Stay (HOSPITAL_BASED_OUTPATIENT_CLINIC_OR_DEPARTMENT_OTHER): Payer: Medicare Other | Admitting: Nurse Practitioner

## 2021-04-09 ENCOUNTER — Encounter: Payer: Self-pay | Admitting: Nurse Practitioner

## 2021-04-09 DIAGNOSIS — D472 Monoclonal gammopathy: Secondary | ICD-10-CM | POA: Diagnosis not present
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
## 2021-04-10 ENCOUNTER — Telehealth: Payer: Self-pay | Admitting: Hematology

## 2021-04-10 NOTE — Telephone Encounter (Signed)
Scheduled appts per 8/11 los. Called pt, no answer. Left msg with appts dates and times.  

## 2021-04-11 ENCOUNTER — Emergency Department (HOSPITAL_COMMUNITY): Payer: Medicare Other

## 2021-04-11 ENCOUNTER — Encounter (HOSPITAL_COMMUNITY): Payer: Self-pay | Admitting: Emergency Medicine

## 2021-04-11 ENCOUNTER — Emergency Department (HOSPITAL_COMMUNITY)
Admission: EM | Admit: 2021-04-11 | Discharge: 2021-04-11 | Disposition: A | Discharge status: Home / Self Care | Payer: Medicare Other | Attending: Emergency Medicine | Admitting: Emergency Medicine

## 2021-04-11 ENCOUNTER — Other Ambulatory Visit: Payer: Self-pay

## 2021-04-11 DIAGNOSIS — Z79899 Other long term (current) drug therapy: Secondary | ICD-10-CM | POA: Insufficient documentation

## 2021-04-11 DIAGNOSIS — Z856 Personal history of leukemia: Secondary | ICD-10-CM | POA: Insufficient documentation

## 2021-04-11 DIAGNOSIS — Z87891 Personal history of nicotine dependence: Secondary | ICD-10-CM | POA: Insufficient documentation

## 2021-04-11 DIAGNOSIS — I5032 Chronic diastolic (congestive) heart failure: Secondary | ICD-10-CM | POA: Insufficient documentation

## 2021-04-11 DIAGNOSIS — E1122 Type 2 diabetes mellitus with diabetic chronic kidney disease: Secondary | ICD-10-CM | POA: Insufficient documentation

## 2021-04-11 DIAGNOSIS — N3001 Acute cystitis with hematuria: Secondary | ICD-10-CM | POA: Diagnosis not present

## 2021-04-11 DIAGNOSIS — N186 End stage renal disease: Secondary | ICD-10-CM | POA: Insufficient documentation

## 2021-04-11 DIAGNOSIS — D72829 Elevated white blood cell count, unspecified: Secondary | ICD-10-CM | POA: Diagnosis not present

## 2021-04-11 DIAGNOSIS — Z992 Dependence on renal dialysis: Secondary | ICD-10-CM | POA: Diagnosis not present

## 2021-04-11 DIAGNOSIS — Z7982 Long term (current) use of aspirin: Secondary | ICD-10-CM | POA: Insufficient documentation

## 2021-04-11 DIAGNOSIS — R319 Hematuria, unspecified: Secondary | ICD-10-CM | POA: Diagnosis not present

## 2021-04-11 DIAGNOSIS — N281 Cyst of kidney, acquired: Secondary | ICD-10-CM | POA: Diagnosis not present

## 2021-04-11 DIAGNOSIS — I132 Hypertensive heart and chronic kidney disease with heart failure and with stage 5 chronic kidney disease, or end stage renal disease: Secondary | ICD-10-CM | POA: Insufficient documentation

## 2021-04-11 DIAGNOSIS — Z794 Long term (current) use of insulin: Secondary | ICD-10-CM | POA: Insufficient documentation

## 2021-04-11 LAB — COMPREHENSIVE METABOLIC PANEL
ALT: 11 U/L (ref 0–44)
AST: 12 U/L — ABNORMAL LOW (ref 15–41)
Albumin: 3.3 g/dL — ABNORMAL LOW (ref 3.5–5.0)
Alkaline Phosphatase: 79 U/L (ref 38–126)
Anion gap: 18 — ABNORMAL HIGH (ref 5–15)
BUN: 80 mg/dL — ABNORMAL HIGH (ref 8–23)
CO2: 23 mmol/L (ref 22–32)
Calcium: 9.5 mg/dL (ref 8.9–10.3)
Chloride: 105 mmol/L (ref 98–111)
Creatinine, Ser: 14.15 mg/dL — ABNORMAL HIGH (ref 0.61–1.24)
GFR, Estimated: 3 mL/min — ABNORMAL LOW (ref >60)
Glucose, Bld: 156 mg/dL — ABNORMAL HIGH (ref 70–99)
Potassium: 3.9 mmol/L (ref 3.5–5.1)
Sodium: 146 mmol/L — ABNORMAL HIGH (ref 135–145)
Total Bilirubin: 0.5 mg/dL (ref 0.3–1.2)
Total Protein: 7.7 g/dL (ref 6.5–8.1)

## 2021-04-11 LAB — URINALYSIS, ROUTINE W REFLEX MICROSCOPIC
Bilirubin Urine: NEGATIVE
Glucose, UA: 150 mg/dL — AB
Ketones, ur: NEGATIVE mg/dL
Nitrite: NEGATIVE
Protein, ur: >=300 mg/dL — AB
RBC / HPF: >50 RBC/hpf — ABNORMAL HIGH (ref 0–5)
Specific Gravity, Urine: 1.013 (ref 1.005–1.030)
WBC, UA: >50 WBC/hpf — ABNORMAL HIGH (ref 0–5)
pH: 8 (ref 5.0–8.0)

## 2021-04-11 LAB — CBC WITH DIFFERENTIAL/PLATELET
Abs Immature Granulocytes: 0.02 10*3/uL (ref 0.00–0.07)
Basophils Absolute: 0.1 10*3/uL (ref 0.0–0.1)
Basophils Relative: 1 %
Eosinophils Absolute: 0.3 10*3/uL (ref 0.0–0.5)
Eosinophils Relative: 2 %
HCT: 31.8 % — ABNORMAL LOW (ref 39.0–52.0)
Hemoglobin: 10.1 g/dL — ABNORMAL LOW (ref 13.0–17.0)
Immature Granulocytes: 0 %
Lymphocytes Relative: 17 %
Lymphs Abs: 1.9 10*3/uL (ref 0.7–4.0)
MCH: 30.4 pg (ref 26.0–34.0)
MCHC: 31.8 g/dL (ref 30.0–36.0)
MCV: 95.8 fL (ref 80.0–100.0)
Monocytes Absolute: 0.8 10*3/uL (ref 0.1–1.0)
Monocytes Relative: 7 %
Neutro Abs: 8.1 10*3/uL — ABNORMAL HIGH (ref 1.7–7.7)
Neutrophils Relative %: 73 %
Platelets: 429 10*3/uL — ABNORMAL HIGH (ref 150–400)
RBC: 3.32 MIL/uL — ABNORMAL LOW (ref 4.22–5.81)
RDW: 18.3 % — ABNORMAL HIGH (ref 11.5–15.5)
WBC: 11.2 10*3/uL — ABNORMAL HIGH (ref 4.0–10.5)
nRBC: 0.2 % (ref 0.0–0.2)

## 2021-04-11 MED ORDER — SODIUM CHLORIDE 0.9 % IV SOLN
1.0000 g | Freq: Once | INTRAVENOUS | Status: AC
  Administered 2021-04-11: 1 g via INTRAVENOUS
  Filled 2021-04-11: qty 10

## 2021-04-11 MED ORDER — CEFPODOXIME PROXETIL 200 MG PO TABS: 200.0000 mg | ORAL_TABLET | Freq: Every day | ORAL | 0 refills | Status: DC

## 2021-04-11 MED ORDER — CEFPODOXIME PROXETIL 200 MG PO TABS: 200.0000 mg | ORAL_TABLET | Freq: Two times a day (BID) | ORAL | 0 refills | Status: DC

## 2021-04-11 MED ORDER — CEPHALEXIN 500 MG PO CAPS: 500.0000 mg | ORAL_CAPSULE | Freq: Two times a day (BID) | ORAL | 0 refills | Status: AC

## 2021-04-11 NOTE — ED Provider Notes (Signed)
Emergency Medicine Provider Triage Evaluation Note  Mark [name] , a 70 y.o. male  was evaluated in triage.  Pt complains of dysuria as well as hematuria.  Patient states he woke up with his symptoms this morning.  He states that when urinating he noticed blood as well as clots.  Denies a history of similar symptoms.  Also complains of right flank pain.  No fevers or vomiting.  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  Physical Exam  BP 106/60 (BP Location: Right Arm)   Pulse 92   Temp 98.5 F (36.9 C) (Oral)   Resp 16   Ht 5\' 3"  (1.6 m)   Wt 80.7 kg   SpO2 96%   BMI 31.53 kg/m  Gen:   Awake, no distress   Resp:  Normal effort  MSK:   Moves extremities without difficulty  Other:  Left BKA noted.  Moderate tenderness noted along the right flank.  Abdomen is flat and soft.  No abdominal tenderness noted.  Medical Decision Making  Medically screening exam initiated at 10:07 AM.  Appropriate orders placed.  [name] was informed that the remainder of the evaluation will be completed by another provider, this initial triage assessment does not replace that evaluation, and the importance of remaining in the ED until their evaluation is complete.   Rayna Sexton, PA-C 04/11/21 1008    Arnaldo Natal, MD 04/11/21 1321

## 2021-04-11 NOTE — Discharge Instructions (Addendum)
I prescribed you an antibiotic called Keflex.  Please take 500 mg twice per day.  On days that you have hemodialysis, please take this after dialysis.  Please make sure you go to your dialysis appointment at 6:30 AM on Monday morning.  If you develop any worsening symptoms such as confusion, nausea, vomiting, fevers, please come back to the emergency department immediately for reevaluation.  It was a pleasure to meet you both.

## 2021-04-11 NOTE — ED Triage Notes (Signed)
Patient reports dysuria and hematuria upon waking up this morning. States he was trying to urinate this morning and noticed several clots in his urine. Reports pain worsens when urinating.
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
## 2021-04-13 DIAGNOSIS — Z992 Dependence on renal dialysis: Secondary | ICD-10-CM | POA: Diagnosis not present

## 2021-04-13 DIAGNOSIS — N186 End stage renal disease: Secondary | ICD-10-CM | POA: Diagnosis not present

## 2021-04-13 DIAGNOSIS — D688 Other specified coagulation defects: Secondary | ICD-10-CM | POA: Diagnosis not present

## 2021-04-13 DIAGNOSIS — D509 Iron deficiency anemia, unspecified: Secondary | ICD-10-CM | POA: Diagnosis not present

## 2021-04-13 DIAGNOSIS — N2581 Secondary hyperparathyroidism of renal origin: Secondary | ICD-10-CM | POA: Diagnosis not present

## 2021-04-13 DIAGNOSIS — D631 Anemia in chronic kidney disease: Secondary | ICD-10-CM | POA: Diagnosis not present

## 2021-04-13 DIAGNOSIS — D472 Monoclonal gammopathy: Secondary | ICD-10-CM | POA: Diagnosis not present

## 2021-04-13 LAB — URINE CULTURE: Culture: >=100000 — AB

## 2021-04-14 ENCOUNTER — Telehealth: Payer: Self-pay

## 2021-04-14 DIAGNOSIS — D472 Monoclonal gammopathy: Secondary | ICD-10-CM | POA: Diagnosis not present

## 2021-04-14 DIAGNOSIS — N186 End stage renal disease: Secondary | ICD-10-CM | POA: Diagnosis not present

## 2021-04-14 DIAGNOSIS — Z992 Dependence on renal dialysis: Secondary | ICD-10-CM | POA: Diagnosis not present

## 2021-04-14 DIAGNOSIS — D688 Other specified coagulation defects: Secondary | ICD-10-CM | POA: Diagnosis not present

## 2021-04-14 DIAGNOSIS — D509 Iron deficiency anemia, unspecified: Secondary | ICD-10-CM | POA: Diagnosis not present

## 2021-04-14 DIAGNOSIS — N2581 Secondary hyperparathyroidism of renal origin: Secondary | ICD-10-CM | POA: Diagnosis not present

## 2021-04-14 DIAGNOSIS — D631 Anemia in chronic kidney disease: Secondary | ICD-10-CM | POA: Diagnosis not present

## 2021-04-14 NOTE — Progress Notes (Signed)
ED Antimicrobial Stewardship Positive Culture Follow Up   Mark [name] is an 70 y.o. male who presented to [facility] on 04/11/2021 with a chief complaint of  Chief Complaint  Patient presents with   Hematuria    Recent Results (from the past 720 hour(s))  Urine Culture     Status: Abnormal   Collection Time: 04/11/21 11:53 AM   Specimen: Urine, Clean Catch  Result Value Ref Range Status   Specimen Description   Final    URINE, CLEAN CATCH Performed at [facility], [place] [address] [place], [place] [address]    Special Requests   Final    NONE Performed at [facility], [place] [address] [place], [place] [address]    Culture >=100,000 COLONIES/mL ENTEROCOCCUS FAECALIS (A)  Final   Report Status 04/13/2021 FINAL  Final   Organism ID, Bacteria ENTEROCOCCUS FAECALIS (A)  Final      Susceptibility   Enterococcus faecalis - MIC*    AMPICILLIN <=2 SENSITIVE Sensitive     NITROFURANTOIN <=16 SENSITIVE Sensitive     VANCOMYCIN 2 SENSITIVE Sensitive     * >=100,000 COLONIES/mL ENTEROCOCCUS FAECALIS   Cephalosporins do not adequately cover enterococcus species so alternative therapy is required. Culture indicates sensitivity to ampicillin, so renally dose adjusted oral amoxicillin is appropriate.    [x]  Treated with rocephin and keflex, organism intrinsically resistant to prescribed antimicrobial []  Patient discharged originally without antimicrobial agent and treatment is now indicated  New antibiotic prescription: Amoxicillin 500 mg daily for 5 days.   ED Provider: Nyra Jabs, MD   Vicente Masson 04/14/2021, 9:16 AM Clinical Pharmacist Monday - Friday phone -  [phone] Saturday - Sunday phone - [phone]

## 2021-04-14 NOTE — Telephone Encounter (Signed)
Post ED Visit - Positive Culture Follow-up: Unsuccessful Patient Follow-up  Culture assessed and recommendations reviewed by:  []  Elenor Quinones, Pharm.D. []  Heide Guile, Pharm.D., BCPS AQ-ID []  Parks Neptune, Pharm.D., BCPS []  Alycia Rossetti, Pharm.D., BCPS []  [place], Pharm.D., BCPS, AAHIVP []  Legrand Como, Pharm.D., BCPS, AAHIVP []  Wynell Balloon, PharmD []  Vincenza Hews, PharmD, BCPS  Positive urine culture  []  Patient discharged without antimicrobial prescription and treatment is now indicated [x]  Organism is resistant to prescribed ED discharge antimicrobial  Culture reviewed by Dr. Teressa Lower, MD Plan discontinue Keflex and start Amoxicillin 500mg  po Q HS x 5 days.    Unable to contact patient after 3 attempts, letter will be sent to address on file  Glennon Hamilton 04/14/2021, 1:29 PM

## 2021-04-16 DIAGNOSIS — D472 Monoclonal gammopathy: Secondary | ICD-10-CM | POA: Diagnosis not present

## 2021-04-16 DIAGNOSIS — D509 Iron deficiency anemia, unspecified: Secondary | ICD-10-CM | POA: Diagnosis not present

## 2021-04-16 DIAGNOSIS — D688 Other specified coagulation defects: Secondary | ICD-10-CM | POA: Diagnosis not present

## 2021-04-16 DIAGNOSIS — Z992 Dependence on renal dialysis: Secondary | ICD-10-CM | POA: Diagnosis not present

## 2021-04-16 DIAGNOSIS — D631 Anemia in chronic kidney disease: Secondary | ICD-10-CM | POA: Diagnosis not present

## 2021-04-16 DIAGNOSIS — N2581 Secondary hyperparathyroidism of renal origin: Secondary | ICD-10-CM | POA: Diagnosis not present

## 2021-04-16 DIAGNOSIS — N186 End stage renal disease: Secondary | ICD-10-CM | POA: Diagnosis not present

## 2021-04-17 ENCOUNTER — Telehealth: Payer: Self-pay

## 2021-04-17 ENCOUNTER — Other Ambulatory Visit: Payer: Self-pay

## 2021-04-17 ENCOUNTER — Inpatient Hospital Stay: Payer: Medicare Other

## 2021-04-17 DIAGNOSIS — D472 Monoclonal gammopathy: Secondary | ICD-10-CM | POA: Diagnosis not present

## 2021-04-17 DIAGNOSIS — D631 Anemia in chronic kidney disease: Secondary | ICD-10-CM | POA: Diagnosis not present

## 2021-04-17 DIAGNOSIS — N186 End stage renal disease: Secondary | ICD-10-CM | POA: Diagnosis not present

## 2021-04-17 DIAGNOSIS — I132 Hypertensive heart and chronic kidney disease with heart failure and with stage 5 chronic kidney disease, or end stage renal disease: Secondary | ICD-10-CM | POA: Diagnosis not present

## 2021-04-17 DIAGNOSIS — Z992 Dependence on renal dialysis: Secondary | ICD-10-CM | POA: Diagnosis not present

## 2021-04-17 DIAGNOSIS — E1122 Type 2 diabetes mellitus with diabetic chronic kidney disease: Secondary | ICD-10-CM | POA: Diagnosis not present

## 2021-04-17 LAB — CMP (CANCER CENTER ONLY)
ALT: 8 U/L (ref 0–44)
AST: 11 U/L — ABNORMAL LOW (ref 15–41)
Albumin: 3.2 g/dL — ABNORMAL LOW (ref 3.5–5.0)
Alkaline Phosphatase: 84 U/L (ref 38–126)
Anion gap: 15 (ref 5–15)
BUN: 35 mg/dL — ABNORMAL HIGH (ref 8–23)
CO2: 31 mmol/L (ref 22–32)
Calcium: 10.3 mg/dL (ref 8.9–10.3)
Chloride: 94 mmol/L — ABNORMAL LOW (ref 98–111)
Creatinine: 7.4 mg/dL (ref 0.61–1.24)
GFR, Estimated: 7 mL/min — ABNORMAL LOW (ref >60)
Glucose, Bld: 130 mg/dL — ABNORMAL HIGH (ref 70–99)
Potassium: 3.5 mmol/L (ref 3.5–5.1)
Sodium: 140 mmol/L (ref 135–145)
Total Bilirubin: 0.3 mg/dL (ref 0.3–1.2)
Total Protein: 8.1 g/dL (ref 6.5–8.1)

## 2021-04-17 LAB — CBC WITH DIFFERENTIAL (CANCER CENTER ONLY)
Abs Immature Granulocytes: 0.02 10*3/uL (ref 0.00–0.07)
Basophils Absolute: 0.1 10*3/uL (ref 0.0–0.1)
Basophils Relative: 1 %
Eosinophils Absolute: 0.3 10*3/uL (ref 0.0–0.5)
Eosinophils Relative: 4 %
HCT: 32.2 % — ABNORMAL LOW (ref 39.0–52.0)
Hemoglobin: 10.4 g/dL — ABNORMAL LOW (ref 13.0–17.0)
Immature Granulocytes: 0 %
Lymphocytes Relative: 35 %
Lymphs Abs: 2.8 10*3/uL (ref 0.7–4.0)
MCH: 30.1 pg (ref 26.0–34.0)
MCHC: 32.3 g/dL (ref 30.0–36.0)
MCV: 93.1 fL (ref 80.0–100.0)
Monocytes Absolute: 0.7 10*3/uL (ref 0.1–1.0)
Monocytes Relative: 9 %
Neutro Abs: 4.3 10*3/uL (ref 1.7–7.7)
Neutrophils Relative %: 51 %
Platelet Count: 330 10*3/uL (ref 150–400)
RBC: 3.46 MIL/uL — ABNORMAL LOW (ref 4.22–5.81)
RDW: 17.3 % — ABNORMAL HIGH (ref 11.5–15.5)
WBC Count: 8.2 10*3/uL (ref 4.0–10.5)
nRBC: 0 % (ref 0.0–0.2)

## 2021-04-17 NOTE — Telephone Encounter (Signed)
CRITICAL VALUE STICKER  CRITICAL VALUE:  CREATININE  7.4  RECEIVER (on-site recipient of call): Ulyess Muto P. LPN    DATE & TIME NOTIFIED: 04/17/21 2:54 pm  MESSENGER (representative from lab): Lelan Pons  MD NOTIFIED:  Dr. Burr Medico  TIME OF NOTIFICATION: 3:15 pm

## 2021-04-18 DIAGNOSIS — N2581 Secondary hyperparathyroidism of renal origin: Secondary | ICD-10-CM | POA: Diagnosis not present

## 2021-04-18 DIAGNOSIS — D509 Iron deficiency anemia, unspecified: Secondary | ICD-10-CM | POA: Diagnosis not present

## 2021-04-18 DIAGNOSIS — D472 Monoclonal gammopathy: Secondary | ICD-10-CM | POA: Diagnosis not present

## 2021-04-18 DIAGNOSIS — N186 End stage renal disease: Secondary | ICD-10-CM | POA: Diagnosis not present

## 2021-04-18 DIAGNOSIS — D631 Anemia in chronic kidney disease: Secondary | ICD-10-CM | POA: Diagnosis not present

## 2021-04-18 DIAGNOSIS — D688 Other specified coagulation defects: Secondary | ICD-10-CM | POA: Diagnosis not present

## 2021-04-18 DIAGNOSIS — Z992 Dependence on renal dialysis: Secondary | ICD-10-CM | POA: Diagnosis not present

## 2021-04-20 ENCOUNTER — Telehealth: Payer: Self-pay

## 2021-04-20 LAB — KAPPA/LAMBDA LIGHT CHAINS
Kappa free light chain: 364.6 mg/L — ABNORMAL HIGH (ref 3.3–19.4)
Kappa, lambda light chain ratio: 2.43 — ABNORMAL HIGH (ref 0.26–1.65)
Lambda free light chains: 150.1 mg/L — ABNORMAL HIGH (ref 5.7–26.3)

## 2021-04-20 NOTE — Telephone Encounter (Signed)
Post ED Visit - Positive Culture Follow-up: Successful Patient Follow-Up  Culture assessed and recommendations reviewed by:  []  Elenor Quinones, Pharm.D. []  Heide Guile, Pharm.D., BCPS AQ-ID []  Parks Neptune, Pharm.D., BCPS []  Alycia Rossetti, Pharm.D., BCPS []  [place], Pharm.D., BCPS, AAHIVP []  Legrand Como, Pharm.D., BCPS, AAHIVP []  Salome Arnt, PharmD, BCPS []  Johnnette Gourd, PharmD, BCPS []  Hughes Better, PharmD, BCPS []  Leeroy Cha, PharmD  Positive urine culture  []  Patient discharged without antimicrobial prescription and treatment is now indicated [x]  Organism is resistant to prescribed ED discharge antimicrobial []  Patient with positive blood cultures  Changes discussed with ED provider: Teressa Lower, MD New antibiotic prescription Amoxicillin 500mg  po q HS x 5 days Called to [facility] on [address]. [place], [place]  Contacted patient, date 04/20/21, time [place], [place] 04/20/2021, 9:50 AM

## 2021-04-21 DIAGNOSIS — D688 Other specified coagulation defects: Secondary | ICD-10-CM | POA: Diagnosis not present

## 2021-04-21 DIAGNOSIS — D472 Monoclonal gammopathy: Secondary | ICD-10-CM | POA: Diagnosis not present

## 2021-04-21 DIAGNOSIS — Z992 Dependence on renal dialysis: Secondary | ICD-10-CM | POA: Diagnosis not present

## 2021-04-21 DIAGNOSIS — D631 Anemia in chronic kidney disease: Secondary | ICD-10-CM | POA: Diagnosis not present

## 2021-04-21 DIAGNOSIS — D509 Iron deficiency anemia, unspecified: Secondary | ICD-10-CM | POA: Diagnosis not present

## 2021-04-21 DIAGNOSIS — N186 End stage renal disease: Secondary | ICD-10-CM | POA: Diagnosis not present

## 2021-04-21 DIAGNOSIS — N2581 Secondary hyperparathyroidism of renal origin: Secondary | ICD-10-CM | POA: Diagnosis not present

## 2021-04-21 LAB — MULTIPLE MYELOMA PANEL, SERUM
Albumin SerPl Elph-Mcnc: 3.2 g/dL (ref 2.9–4.4)
Albumin/Glob SerPl: 0.9 (ref 0.7–1.7)
Alpha 1: 0.3 g/dL (ref 0.0–0.4)
Alpha2 Glob SerPl Elph-Mcnc: 0.9 g/dL (ref 0.4–1.0)
B-Globulin SerPl Elph-Mcnc: 1 g/dL (ref 0.7–1.3)
Gamma Glob SerPl Elph-Mcnc: 1.8 g/dL (ref 0.4–1.8)
Globulin, Total: 4 g/dL — ABNORMAL HIGH (ref 2.2–3.9)
IgA: 223 mg/dL (ref 61–437)
IgG (Immunoglobin G), Serum: 1757 mg/dL — ABNORMAL HIGH (ref 603–1613)
IgM (Immunoglobulin M), Srm: 408 mg/dL — ABNORMAL HIGH (ref 20–172)
M Protein SerPl Elph-Mcnc: 0.9 g/dL — ABNORMAL HIGH
Total Protein ELP: 7.2 g/dL (ref 6.0–8.5)

## 2021-04-23 DIAGNOSIS — D509 Iron deficiency anemia, unspecified: Secondary | ICD-10-CM | POA: Diagnosis not present

## 2021-04-23 DIAGNOSIS — D631 Anemia in chronic kidney disease: Secondary | ICD-10-CM | POA: Diagnosis not present

## 2021-04-23 DIAGNOSIS — N186 End stage renal disease: Secondary | ICD-10-CM | POA: Diagnosis not present

## 2021-04-23 DIAGNOSIS — N2581 Secondary hyperparathyroidism of renal origin: Secondary | ICD-10-CM | POA: Diagnosis not present

## 2021-04-23 DIAGNOSIS — Z992 Dependence on renal dialysis: Secondary | ICD-10-CM | POA: Diagnosis not present

## 2021-04-23 DIAGNOSIS — D688 Other specified coagulation defects: Secondary | ICD-10-CM | POA: Diagnosis not present

## 2021-04-23 DIAGNOSIS — D472 Monoclonal gammopathy: Secondary | ICD-10-CM | POA: Diagnosis not present

## 2021-04-25 DIAGNOSIS — D631 Anemia in chronic kidney disease: Secondary | ICD-10-CM | POA: Diagnosis not present

## 2021-04-25 DIAGNOSIS — D688 Other specified coagulation defects: Secondary | ICD-10-CM | POA: Diagnosis not present

## 2021-04-25 DIAGNOSIS — N186 End stage renal disease: Secondary | ICD-10-CM | POA: Diagnosis not present

## 2021-04-25 DIAGNOSIS — N2581 Secondary hyperparathyroidism of renal origin: Secondary | ICD-10-CM | POA: Diagnosis not present

## 2021-04-25 DIAGNOSIS — D509 Iron deficiency anemia, unspecified: Secondary | ICD-10-CM | POA: Diagnosis not present

## 2021-04-25 DIAGNOSIS — D472 Monoclonal gammopathy: Secondary | ICD-10-CM | POA: Diagnosis not present

## 2021-04-25 DIAGNOSIS — Z992 Dependence on renal dialysis: Secondary | ICD-10-CM | POA: Diagnosis not present

## 2021-04-28 DIAGNOSIS — D688 Other specified coagulation defects: Secondary | ICD-10-CM | POA: Diagnosis not present

## 2021-04-28 DIAGNOSIS — D509 Iron deficiency anemia, unspecified: Secondary | ICD-10-CM | POA: Diagnosis not present

## 2021-04-28 DIAGNOSIS — Z992 Dependence on renal dialysis: Secondary | ICD-10-CM | POA: Diagnosis not present

## 2021-04-28 DIAGNOSIS — N186 End stage renal disease: Secondary | ICD-10-CM | POA: Diagnosis not present

## 2021-04-28 DIAGNOSIS — D472 Monoclonal gammopathy: Secondary | ICD-10-CM | POA: Diagnosis not present

## 2021-04-28 DIAGNOSIS — D631 Anemia in chronic kidney disease: Secondary | ICD-10-CM | POA: Diagnosis not present

## 2021-04-28 DIAGNOSIS — N2581 Secondary hyperparathyroidism of renal origin: Secondary | ICD-10-CM | POA: Diagnosis not present

## 2021-04-29 DIAGNOSIS — Z992 Dependence on renal dialysis: Secondary | ICD-10-CM | POA: Diagnosis not present

## 2021-04-29 DIAGNOSIS — N186 End stage renal disease: Secondary | ICD-10-CM | POA: Diagnosis not present

## 2021-04-29 DIAGNOSIS — E1129 Type 2 diabetes mellitus with other diabetic kidney complication: Secondary | ICD-10-CM | POA: Diagnosis not present

## 2021-04-30 ENCOUNTER — Telehealth: Payer: Self-pay | Admitting: Internal Medicine

## 2021-04-30 DIAGNOSIS — N186 End stage renal disease: Secondary | ICD-10-CM | POA: Diagnosis not present

## 2021-04-30 DIAGNOSIS — D509 Iron deficiency anemia, unspecified: Secondary | ICD-10-CM | POA: Diagnosis not present

## 2021-04-30 DIAGNOSIS — D472 Monoclonal gammopathy: Secondary | ICD-10-CM | POA: Diagnosis not present

## 2021-04-30 DIAGNOSIS — Z992 Dependence on renal dialysis: Secondary | ICD-10-CM | POA: Diagnosis not present

## 2021-04-30 DIAGNOSIS — D688 Other specified coagulation defects: Secondary | ICD-10-CM | POA: Diagnosis not present

## 2021-04-30 DIAGNOSIS — D631 Anemia in chronic kidney disease: Secondary | ICD-10-CM | POA: Diagnosis not present

## 2021-04-30 DIAGNOSIS — N2581 Secondary hyperparathyroidism of renal origin: Secondary | ICD-10-CM | POA: Diagnosis not present

## 2021-04-30 DIAGNOSIS — G4489 Other headache syndrome: Secondary | ICD-10-CM

## 2021-04-30 NOTE — Telephone Encounter (Signed)
Copied from [place] [id number]. Topic: Appointment Scheduling - Scheduling Inquiry for Clinic >> Apr 28, 2021 12:01 PM Greggory Keen D wrote: Reason for CRM: Pt called saying he wants Dr. Wynetta Emery to refer him to neurology.  He said he has had seizures before and does not want to have another one.    CB#  [phone]

## 2021-04-30 NOTE — Telephone Encounter (Signed)
Will forward to provider  

## 2021-05-02 DIAGNOSIS — D509 Iron deficiency anemia, unspecified: Secondary | ICD-10-CM | POA: Diagnosis not present

## 2021-05-02 DIAGNOSIS — D688 Other specified coagulation defects: Secondary | ICD-10-CM | POA: Diagnosis not present

## 2021-05-02 DIAGNOSIS — D631 Anemia in chronic kidney disease: Secondary | ICD-10-CM | POA: Diagnosis not present

## 2021-05-02 DIAGNOSIS — D472 Monoclonal gammopathy: Secondary | ICD-10-CM | POA: Diagnosis not present

## 2021-05-02 DIAGNOSIS — Z992 Dependence on renal dialysis: Secondary | ICD-10-CM | POA: Diagnosis not present

## 2021-05-02 DIAGNOSIS — N186 End stage renal disease: Secondary | ICD-10-CM | POA: Diagnosis not present

## 2021-05-02 DIAGNOSIS — N2581 Secondary hyperparathyroidism of renal origin: Secondary | ICD-10-CM | POA: Diagnosis not present

## 2021-05-05 DIAGNOSIS — Z992 Dependence on renal dialysis: Secondary | ICD-10-CM | POA: Diagnosis not present

## 2021-05-05 DIAGNOSIS — D472 Monoclonal gammopathy: Secondary | ICD-10-CM | POA: Diagnosis not present

## 2021-05-05 DIAGNOSIS — N2581 Secondary hyperparathyroidism of renal origin: Secondary | ICD-10-CM | POA: Diagnosis not present

## 2021-05-05 DIAGNOSIS — D688 Other specified coagulation defects: Secondary | ICD-10-CM | POA: Diagnosis not present

## 2021-05-05 DIAGNOSIS — N186 End stage renal disease: Secondary | ICD-10-CM | POA: Diagnosis not present

## 2021-05-05 DIAGNOSIS — D631 Anemia in chronic kidney disease: Secondary | ICD-10-CM | POA: Diagnosis not present

## 2021-05-05 DIAGNOSIS — D509 Iron deficiency anemia, unspecified: Secondary | ICD-10-CM | POA: Diagnosis not present

## 2021-05-06 NOTE — Telephone Encounter (Signed)
Phone call placed to patient this a.m. to get more information regarding his request for referral to the neurologist.  Patient states that over the past 2 to 3 weeks he started having headaches that occur about 2-3 times a week.  Headaches are above the eyes and can last hours.  He sometimes wakes up in the mornings with blurred vision.  Reports BP has been running low so the nephrologist d/c one of his BP meds.  He had seen neurologist in the past for seizures.  I will go ahead and submit the referral to the neurologist.

## 2021-05-07 DIAGNOSIS — Z992 Dependence on renal dialysis: Secondary | ICD-10-CM | POA: Diagnosis not present

## 2021-05-07 DIAGNOSIS — D631 Anemia in chronic kidney disease: Secondary | ICD-10-CM | POA: Diagnosis not present

## 2021-05-07 DIAGNOSIS — D688 Other specified coagulation defects: Secondary | ICD-10-CM | POA: Diagnosis not present

## 2021-05-07 DIAGNOSIS — N186 End stage renal disease: Secondary | ICD-10-CM | POA: Diagnosis not present

## 2021-05-07 DIAGNOSIS — D472 Monoclonal gammopathy: Secondary | ICD-10-CM | POA: Diagnosis not present

## 2021-05-07 DIAGNOSIS — N2581 Secondary hyperparathyroidism of renal origin: Secondary | ICD-10-CM | POA: Diagnosis not present

## 2021-05-07 DIAGNOSIS — D509 Iron deficiency anemia, unspecified: Secondary | ICD-10-CM | POA: Diagnosis not present

## 2021-05-09 DIAGNOSIS — Z992 Dependence on renal dialysis: Secondary | ICD-10-CM | POA: Diagnosis not present

## 2021-05-09 DIAGNOSIS — D472 Monoclonal gammopathy: Secondary | ICD-10-CM | POA: Diagnosis not present

## 2021-05-09 DIAGNOSIS — N2581 Secondary hyperparathyroidism of renal origin: Secondary | ICD-10-CM | POA: Diagnosis not present

## 2021-05-09 DIAGNOSIS — D509 Iron deficiency anemia, unspecified: Secondary | ICD-10-CM | POA: Diagnosis not present

## 2021-05-09 DIAGNOSIS — D688 Other specified coagulation defects: Secondary | ICD-10-CM | POA: Diagnosis not present

## 2021-05-09 DIAGNOSIS — N186 End stage renal disease: Secondary | ICD-10-CM | POA: Diagnosis not present

## 2021-05-09 DIAGNOSIS — D631 Anemia in chronic kidney disease: Secondary | ICD-10-CM | POA: Diagnosis not present

## 2021-05-12 DIAGNOSIS — N2581 Secondary hyperparathyroidism of renal origin: Secondary | ICD-10-CM | POA: Diagnosis not present

## 2021-05-12 DIAGNOSIS — N186 End stage renal disease: Secondary | ICD-10-CM | POA: Diagnosis not present

## 2021-05-12 DIAGNOSIS — D509 Iron deficiency anemia, unspecified: Secondary | ICD-10-CM | POA: Diagnosis not present

## 2021-05-12 DIAGNOSIS — D472 Monoclonal gammopathy: Secondary | ICD-10-CM | POA: Diagnosis not present

## 2021-05-12 DIAGNOSIS — D631 Anemia in chronic kidney disease: Secondary | ICD-10-CM | POA: Diagnosis not present

## 2021-05-12 DIAGNOSIS — D688 Other specified coagulation defects: Secondary | ICD-10-CM | POA: Diagnosis not present

## 2021-05-12 DIAGNOSIS — Z992 Dependence on renal dialysis: Secondary | ICD-10-CM | POA: Diagnosis not present

## 2021-05-14 DIAGNOSIS — D688 Other specified coagulation defects: Secondary | ICD-10-CM | POA: Diagnosis not present

## 2021-05-14 DIAGNOSIS — Z992 Dependence on renal dialysis: Secondary | ICD-10-CM | POA: Diagnosis not present

## 2021-05-14 DIAGNOSIS — D472 Monoclonal gammopathy: Secondary | ICD-10-CM | POA: Diagnosis not present

## 2021-05-14 DIAGNOSIS — N186 End stage renal disease: Secondary | ICD-10-CM | POA: Diagnosis not present

## 2021-05-14 DIAGNOSIS — D631 Anemia in chronic kidney disease: Secondary | ICD-10-CM | POA: Diagnosis not present

## 2021-05-14 DIAGNOSIS — N2581 Secondary hyperparathyroidism of renal origin: Secondary | ICD-10-CM | POA: Diagnosis not present

## 2021-05-14 DIAGNOSIS — D509 Iron deficiency anemia, unspecified: Secondary | ICD-10-CM | POA: Diagnosis not present

## 2021-05-15 ENCOUNTER — Telehealth: Payer: Self-pay

## 2021-05-15 ENCOUNTER — Telehealth: Payer: Self-pay | Admitting: Internal Medicine

## 2021-05-15 NOTE — Telephone Encounter (Signed)
This nurse reached out to patient and made aware of lab results and recommendations from MD.  Advised that she would like to see him in November instead of February and repeat his labs.  This nurse coordinator time and date with patient and he is in agreement with November 14 at 845 for labs and office visit at 920.  No further questions or concerns at this time.

## 2021-05-15 NOTE — Telephone Encounter (Signed)
-----   Message from Truitt Merle, MD sent at 04/21/2021  6:01 PM EDT ----- Please let pt know that his recent M-protein and light chain is higher than before. He saw Lacie a few weeks ago and he was clinically doing well. Please schedule f/u with me in 3 months with repeated lab a week before, thanks   Krista Blue  ----- Message ----- From: Buel Ream, [facility] [place] Sent: 04/17/2021   2:23 PM EDT To: Truitt Merle, MD

## 2021-05-15 NOTE — Telephone Encounter (Signed)
Copied from [place] [id number]. Topic: General - Other >> May 15, 2021  3:35 PM Leward Quan A wrote: Reason for CRM: Glenard Haring with [facility] [employer] called in needing to speak to nurse about paper work sent in on 05/08/21. Checking status and needing to know when it will be returned completed. Please call with an update Ph#  [phone]

## 2021-05-16 DIAGNOSIS — N186 End stage renal disease: Secondary | ICD-10-CM | POA: Diagnosis not present

## 2021-05-16 DIAGNOSIS — N2581 Secondary hyperparathyroidism of renal origin: Secondary | ICD-10-CM | POA: Diagnosis not present

## 2021-05-16 DIAGNOSIS — D631 Anemia in chronic kidney disease: Secondary | ICD-10-CM | POA: Diagnosis not present

## 2021-05-16 DIAGNOSIS — D688 Other specified coagulation defects: Secondary | ICD-10-CM | POA: Diagnosis not present

## 2021-05-16 DIAGNOSIS — D472 Monoclonal gammopathy: Secondary | ICD-10-CM | POA: Diagnosis not present

## 2021-05-16 DIAGNOSIS — Z992 Dependence on renal dialysis: Secondary | ICD-10-CM | POA: Diagnosis not present

## 2021-05-16 DIAGNOSIS — D509 Iron deficiency anemia, unspecified: Secondary | ICD-10-CM | POA: Diagnosis not present

## 2021-05-18 NOTE — Telephone Encounter (Signed)
Returned call and spoke to [place]. Made aware that pt has an appt on 10/19 and order will be signed then

## 2021-05-19 DIAGNOSIS — D509 Iron deficiency anemia, unspecified: Secondary | ICD-10-CM | POA: Diagnosis not present

## 2021-05-19 DIAGNOSIS — N186 End stage renal disease: Secondary | ICD-10-CM | POA: Diagnosis not present

## 2021-05-19 DIAGNOSIS — D631 Anemia in chronic kidney disease: Secondary | ICD-10-CM | POA: Diagnosis not present

## 2021-05-19 DIAGNOSIS — D472 Monoclonal gammopathy: Secondary | ICD-10-CM | POA: Diagnosis not present

## 2021-05-19 DIAGNOSIS — N2581 Secondary hyperparathyroidism of renal origin: Secondary | ICD-10-CM | POA: Diagnosis not present

## 2021-05-19 DIAGNOSIS — Z992 Dependence on renal dialysis: Secondary | ICD-10-CM | POA: Diagnosis not present

## 2021-05-19 DIAGNOSIS — D688 Other specified coagulation defects: Secondary | ICD-10-CM | POA: Diagnosis not present

## 2021-05-21 DIAGNOSIS — N186 End stage renal disease: Secondary | ICD-10-CM | POA: Diagnosis not present

## 2021-05-21 DIAGNOSIS — Z992 Dependence on renal dialysis: Secondary | ICD-10-CM | POA: Diagnosis not present

## 2021-05-21 DIAGNOSIS — N2581 Secondary hyperparathyroidism of renal origin: Secondary | ICD-10-CM | POA: Diagnosis not present

## 2021-05-21 DIAGNOSIS — D472 Monoclonal gammopathy: Secondary | ICD-10-CM | POA: Diagnosis not present

## 2021-05-21 DIAGNOSIS — D509 Iron deficiency anemia, unspecified: Secondary | ICD-10-CM | POA: Diagnosis not present

## 2021-05-21 DIAGNOSIS — D688 Other specified coagulation defects: Secondary | ICD-10-CM | POA: Diagnosis not present

## 2021-05-21 DIAGNOSIS — D631 Anemia in chronic kidney disease: Secondary | ICD-10-CM | POA: Diagnosis not present

## 2021-05-23 DIAGNOSIS — D472 Monoclonal gammopathy: Secondary | ICD-10-CM | POA: Diagnosis not present

## 2021-05-23 DIAGNOSIS — N186 End stage renal disease: Secondary | ICD-10-CM | POA: Diagnosis not present

## 2021-05-23 DIAGNOSIS — D509 Iron deficiency anemia, unspecified: Secondary | ICD-10-CM | POA: Diagnosis not present

## 2021-05-23 DIAGNOSIS — Z992 Dependence on renal dialysis: Secondary | ICD-10-CM | POA: Diagnosis not present

## 2021-05-23 DIAGNOSIS — N2581 Secondary hyperparathyroidism of renal origin: Secondary | ICD-10-CM | POA: Diagnosis not present

## 2021-05-23 DIAGNOSIS — D688 Other specified coagulation defects: Secondary | ICD-10-CM | POA: Diagnosis not present

## 2021-05-23 DIAGNOSIS — D631 Anemia in chronic kidney disease: Secondary | ICD-10-CM | POA: Diagnosis not present

## 2021-05-26 DIAGNOSIS — N2581 Secondary hyperparathyroidism of renal origin: Secondary | ICD-10-CM | POA: Diagnosis not present

## 2021-05-26 DIAGNOSIS — D472 Monoclonal gammopathy: Secondary | ICD-10-CM | POA: Diagnosis not present

## 2021-05-26 DIAGNOSIS — D631 Anemia in chronic kidney disease: Secondary | ICD-10-CM | POA: Diagnosis not present

## 2021-05-26 DIAGNOSIS — D509 Iron deficiency anemia, unspecified: Secondary | ICD-10-CM | POA: Diagnosis not present

## 2021-05-26 DIAGNOSIS — D688 Other specified coagulation defects: Secondary | ICD-10-CM | POA: Diagnosis not present

## 2021-05-26 DIAGNOSIS — Z992 Dependence on renal dialysis: Secondary | ICD-10-CM | POA: Diagnosis not present

## 2021-05-26 DIAGNOSIS — N186 End stage renal disease: Secondary | ICD-10-CM | POA: Diagnosis not present

## 2021-05-28 DIAGNOSIS — Z992 Dependence on renal dialysis: Secondary | ICD-10-CM | POA: Diagnosis not present

## 2021-05-28 DIAGNOSIS — D688 Other specified coagulation defects: Secondary | ICD-10-CM | POA: Diagnosis not present

## 2021-05-28 DIAGNOSIS — D472 Monoclonal gammopathy: Secondary | ICD-10-CM | POA: Diagnosis not present

## 2021-05-28 DIAGNOSIS — N2581 Secondary hyperparathyroidism of renal origin: Secondary | ICD-10-CM | POA: Diagnosis not present

## 2021-05-28 DIAGNOSIS — D509 Iron deficiency anemia, unspecified: Secondary | ICD-10-CM | POA: Diagnosis not present

## 2021-05-28 DIAGNOSIS — N186 End stage renal disease: Secondary | ICD-10-CM | POA: Diagnosis not present

## 2021-05-28 DIAGNOSIS — D631 Anemia in chronic kidney disease: Secondary | ICD-10-CM | POA: Diagnosis not present

## 2021-05-29 DIAGNOSIS — N186 End stage renal disease: Secondary | ICD-10-CM | POA: Diagnosis not present

## 2021-05-29 DIAGNOSIS — Z992 Dependence on renal dialysis: Secondary | ICD-10-CM | POA: Diagnosis not present

## 2021-05-29 DIAGNOSIS — E1129 Type 2 diabetes mellitus with other diabetic kidney complication: Secondary | ICD-10-CM | POA: Diagnosis not present

## 2021-05-30 DIAGNOSIS — D631 Anemia in chronic kidney disease: Secondary | ICD-10-CM | POA: Diagnosis not present

## 2021-05-30 DIAGNOSIS — D509 Iron deficiency anemia, unspecified: Secondary | ICD-10-CM | POA: Diagnosis not present

## 2021-05-30 DIAGNOSIS — N186 End stage renal disease: Secondary | ICD-10-CM | POA: Diagnosis not present

## 2021-05-30 DIAGNOSIS — N2581 Secondary hyperparathyroidism of renal origin: Secondary | ICD-10-CM | POA: Diagnosis not present

## 2021-05-30 DIAGNOSIS — D688 Other specified coagulation defects: Secondary | ICD-10-CM | POA: Diagnosis not present

## 2021-05-30 DIAGNOSIS — Z23 Encounter for immunization: Secondary | ICD-10-CM | POA: Diagnosis not present

## 2021-05-30 DIAGNOSIS — Z992 Dependence on renal dialysis: Secondary | ICD-10-CM | POA: Diagnosis not present

## 2021-05-30 DIAGNOSIS — D472 Monoclonal gammopathy: Secondary | ICD-10-CM | POA: Diagnosis not present

## 2021-06-02 DIAGNOSIS — D472 Monoclonal gammopathy: Secondary | ICD-10-CM | POA: Diagnosis not present

## 2021-06-02 DIAGNOSIS — N186 End stage renal disease: Secondary | ICD-10-CM | POA: Diagnosis not present

## 2021-06-02 DIAGNOSIS — D631 Anemia in chronic kidney disease: Secondary | ICD-10-CM | POA: Diagnosis not present

## 2021-06-02 DIAGNOSIS — N2581 Secondary hyperparathyroidism of renal origin: Secondary | ICD-10-CM | POA: Diagnosis not present

## 2021-06-02 DIAGNOSIS — Z23 Encounter for immunization: Secondary | ICD-10-CM | POA: Diagnosis not present

## 2021-06-02 DIAGNOSIS — D509 Iron deficiency anemia, unspecified: Secondary | ICD-10-CM | POA: Diagnosis not present

## 2021-06-02 DIAGNOSIS — Z992 Dependence on renal dialysis: Secondary | ICD-10-CM | POA: Diagnosis not present

## 2021-06-02 DIAGNOSIS — D688 Other specified coagulation defects: Secondary | ICD-10-CM | POA: Diagnosis not present

## 2021-06-03 ENCOUNTER — Telehealth: Payer: Self-pay | Admitting: Internal Medicine

## 2021-06-03 NOTE — Telephone Encounter (Signed)
Mark [name], from [facility], calling in regards to paperwork that was faxed to office on 05/08/21. She states that they still have not received a response as of yet. She is requesting to have a call back. Please advise .    [phone]

## 2021-06-03 NOTE — Telephone Encounter (Signed)
Pt has an appt with Dr. Margarita Rana on 10/19. Will give paperwork to nurse at visit

## 2021-06-04 DIAGNOSIS — D472 Monoclonal gammopathy: Secondary | ICD-10-CM | POA: Diagnosis not present

## 2021-06-04 DIAGNOSIS — D509 Iron deficiency anemia, unspecified: Secondary | ICD-10-CM | POA: Diagnosis not present

## 2021-06-04 DIAGNOSIS — N2581 Secondary hyperparathyroidism of renal origin: Secondary | ICD-10-CM | POA: Diagnosis not present

## 2021-06-04 DIAGNOSIS — D688 Other specified coagulation defects: Secondary | ICD-10-CM | POA: Diagnosis not present

## 2021-06-04 DIAGNOSIS — N186 End stage renal disease: Secondary | ICD-10-CM | POA: Diagnosis not present

## 2021-06-04 DIAGNOSIS — Z23 Encounter for immunization: Secondary | ICD-10-CM | POA: Diagnosis not present

## 2021-06-04 DIAGNOSIS — D631 Anemia in chronic kidney disease: Secondary | ICD-10-CM | POA: Diagnosis not present

## 2021-06-04 DIAGNOSIS — Z992 Dependence on renal dialysis: Secondary | ICD-10-CM | POA: Diagnosis not present

## 2021-06-06 DIAGNOSIS — N186 End stage renal disease: Secondary | ICD-10-CM | POA: Diagnosis not present

## 2021-06-06 DIAGNOSIS — D631 Anemia in chronic kidney disease: Secondary | ICD-10-CM | POA: Diagnosis not present

## 2021-06-06 DIAGNOSIS — Z23 Encounter for immunization: Secondary | ICD-10-CM | POA: Diagnosis not present

## 2021-06-06 DIAGNOSIS — D688 Other specified coagulation defects: Secondary | ICD-10-CM | POA: Diagnosis not present

## 2021-06-06 DIAGNOSIS — D509 Iron deficiency anemia, unspecified: Secondary | ICD-10-CM | POA: Diagnosis not present

## 2021-06-06 DIAGNOSIS — D472 Monoclonal gammopathy: Secondary | ICD-10-CM | POA: Diagnosis not present

## 2021-06-06 DIAGNOSIS — N2581 Secondary hyperparathyroidism of renal origin: Secondary | ICD-10-CM | POA: Diagnosis not present

## 2021-06-06 DIAGNOSIS — Z992 Dependence on renal dialysis: Secondary | ICD-10-CM | POA: Diagnosis not present

## 2021-06-09 DIAGNOSIS — N186 End stage renal disease: Secondary | ICD-10-CM | POA: Diagnosis not present

## 2021-06-09 DIAGNOSIS — Z23 Encounter for immunization: Secondary | ICD-10-CM | POA: Diagnosis not present

## 2021-06-09 DIAGNOSIS — D631 Anemia in chronic kidney disease: Secondary | ICD-10-CM | POA: Diagnosis not present

## 2021-06-09 DIAGNOSIS — D472 Monoclonal gammopathy: Secondary | ICD-10-CM | POA: Diagnosis not present

## 2021-06-09 DIAGNOSIS — Z992 Dependence on renal dialysis: Secondary | ICD-10-CM | POA: Diagnosis not present

## 2021-06-09 DIAGNOSIS — D509 Iron deficiency anemia, unspecified: Secondary | ICD-10-CM | POA: Diagnosis not present

## 2021-06-09 DIAGNOSIS — N2581 Secondary hyperparathyroidism of renal origin: Secondary | ICD-10-CM | POA: Diagnosis not present

## 2021-06-09 DIAGNOSIS — D688 Other specified coagulation defects: Secondary | ICD-10-CM | POA: Diagnosis not present

## 2021-06-11 DIAGNOSIS — D688 Other specified coagulation defects: Secondary | ICD-10-CM | POA: Diagnosis not present

## 2021-06-11 DIAGNOSIS — D631 Anemia in chronic kidney disease: Secondary | ICD-10-CM | POA: Diagnosis not present

## 2021-06-11 DIAGNOSIS — D472 Monoclonal gammopathy: Secondary | ICD-10-CM | POA: Diagnosis not present

## 2021-06-11 DIAGNOSIS — N186 End stage renal disease: Secondary | ICD-10-CM | POA: Diagnosis not present

## 2021-06-11 DIAGNOSIS — Z992 Dependence on renal dialysis: Secondary | ICD-10-CM | POA: Diagnosis not present

## 2021-06-11 DIAGNOSIS — N2581 Secondary hyperparathyroidism of renal origin: Secondary | ICD-10-CM | POA: Diagnosis not present

## 2021-06-11 DIAGNOSIS — Z23 Encounter for immunization: Secondary | ICD-10-CM | POA: Diagnosis not present

## 2021-06-11 DIAGNOSIS — D509 Iron deficiency anemia, unspecified: Secondary | ICD-10-CM | POA: Diagnosis not present

## 2021-06-12 ENCOUNTER — Ambulatory Visit (INDEPENDENT_AMBULATORY_CARE_PROVIDER_SITE_OTHER): Payer: Medicare Other | Admitting: Neurology

## 2021-06-12 ENCOUNTER — Encounter: Payer: Self-pay | Admitting: Neurology

## 2021-06-12 VITALS — BP 79/52 | HR 76 | Wt 191.0 lb

## 2021-06-12 DIAGNOSIS — R55 Syncope and collapse: Secondary | ICD-10-CM

## 2021-06-12 DIAGNOSIS — G4489 Other headache syndrome: Secondary | ICD-10-CM

## 2021-06-12 DIAGNOSIS — R413 Other amnesia: Secondary | ICD-10-CM | POA: Diagnosis not present
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
## 2021-06-13 DIAGNOSIS — D688 Other specified coagulation defects: Secondary | ICD-10-CM | POA: Diagnosis not present

## 2021-06-13 DIAGNOSIS — N186 End stage renal disease: Secondary | ICD-10-CM | POA: Diagnosis not present

## 2021-06-13 DIAGNOSIS — Z992 Dependence on renal dialysis: Secondary | ICD-10-CM | POA: Diagnosis not present

## 2021-06-13 DIAGNOSIS — D472 Monoclonal gammopathy: Secondary | ICD-10-CM | POA: Diagnosis not present

## 2021-06-13 DIAGNOSIS — D631 Anemia in chronic kidney disease: Secondary | ICD-10-CM | POA: Diagnosis not present

## 2021-06-13 DIAGNOSIS — D509 Iron deficiency anemia, unspecified: Secondary | ICD-10-CM | POA: Diagnosis not present

## 2021-06-13 DIAGNOSIS — Z23 Encounter for immunization: Secondary | ICD-10-CM | POA: Diagnosis not present

## 2021-06-13 DIAGNOSIS — N2581 Secondary hyperparathyroidism of renal origin: Secondary | ICD-10-CM | POA: Diagnosis not present

## 2021-06-13 LAB — SEDIMENTATION RATE: Sed Rate: 75 mm/hr — ABNORMAL HIGH (ref 0–30)

## 2021-06-13 LAB — C-REACTIVE PROTEIN: CRP: 19 mg/L — ABNORMAL HIGH (ref 0–10)

## 2021-06-15 ENCOUNTER — Telehealth: Payer: Self-pay | Admitting: Neurology

## 2021-06-15 NOTE — Telephone Encounter (Signed)
[employer] medicare/medicaid no auth require for the MRI order sent to GI, they will reach out to the patient to schedule  Echo no auth require. Sent message to Butch Penny she will reach out to the patient to schedule

## 2021-06-17 ENCOUNTER — Other Ambulatory Visit: Payer: Self-pay

## 2021-06-17 ENCOUNTER — Ambulatory Visit: Payer: Medicare Other | Attending: Family Medicine | Admitting: Family Medicine

## 2021-06-17 VITALS — BP 135/83 | HR 67 | Resp 16

## 2021-06-17 DIAGNOSIS — E118 Type 2 diabetes mellitus with unspecified complications: Secondary | ICD-10-CM | POA: Diagnosis not present

## 2021-06-17 DIAGNOSIS — N186 End stage renal disease: Secondary | ICD-10-CM

## 2021-06-17 DIAGNOSIS — Z794 Long term (current) use of insulin: Secondary | ICD-10-CM | POA: Diagnosis not present

## 2021-06-17 DIAGNOSIS — Z89512 Acquired absence of left leg below knee: Secondary | ICD-10-CM | POA: Diagnosis not present

## 2021-06-17 DIAGNOSIS — Z992 Dependence on renal dialysis: Secondary | ICD-10-CM

## 2021-06-17 DIAGNOSIS — I10 Essential (primary) hypertension: Secondary | ICD-10-CM | POA: Diagnosis not present

## 2021-06-17 DIAGNOSIS — M19011 Primary osteoarthritis, right shoulder: Secondary | ICD-10-CM

## 2021-06-17 DIAGNOSIS — M19012 Primary osteoarthritis, left shoulder: Secondary | ICD-10-CM

## 2021-06-17 DIAGNOSIS — E1159 Type 2 diabetes mellitus with other circulatory complications: Secondary | ICD-10-CM

## 2021-06-17 LAB — POCT GLYCOSYLATED HEMOGLOBIN (HGB A1C): HbA1c, POC (controlled diabetic range): 7.6 % — AB (ref 0.0–7.0)

## 2021-06-17 LAB — GLUCOSE, POCT (MANUAL RESULT ENTRY): POC Glucose: 150 mg/dl — AB (ref 70–99)

## 2021-06-17 MED ORDER — ONETOUCH DELICA LANCETS 33G MISC: 12 refills | Status: AC

## 2021-06-17 MED ORDER — LANTUS SOLOSTAR 100 UNIT/ML ~~LOC~~ SOPN: 19.0000 [IU] | PEN_INJECTOR | Freq: Every day | SUBCUTANEOUS | 3 refills | Status: DC

## 2021-06-17 MED ORDER — PREDNISONE 20 MG PO TABS: 20.0000 mg | ORAL_TABLET | Freq: Every day | ORAL | 0 refills | Status: DC

## 2021-06-17 MED ORDER — DICLOFENAC SODIUM 1 % EX GEL: 4.0000 g | Freq: Four times a day (QID) | CUTANEOUS | 1 refills | Status: DC

## 2021-06-17 NOTE — Progress Notes (Signed)
Pt is needing repairs on his hoverround

## 2021-06-17 NOTE — Patient Instructions (Signed)
Osteoarthritis Osteoarthritis is a type of arthritis. It refers to joint pain or joint disease. Osteoarthritis affects tissue that covers the ends of bones in joints (cartilage). Cartilage acts as a cushion between the bones and helps them move smoothly. Osteoarthritis occurs when cartilage in the joints gets worn down. Osteoarthritis is sometimes called "wear and tear" arthritis. Osteoarthritis is the most common form of arthritis. It often occurs in older people. It is a condition that gets worse over time. The joints most often affected by this condition are in the fingers, toes, hips, knees, and spine, including the neck and lower back. What are the causes? This condition is caused by the wearing down of cartilage that covers the ends of bones. What increases the risk? The following factors may make you more likely to develop this condition: Being age 50 or older. Obesity. Overuse of joints. Past injury of a joint. Past surgery on a joint. Family history of osteoarthritis. What are the signs or symptoms? The main symptoms of this condition are pain, swelling, and stiffness in the joint. Other symptoms may include: An enlarged joint. More pain and further damage caused by small pieces of bone or cartilage that break off and float inside of the joint. Small deposits of bone (osteophytes) that grow on the edges of the joint. A grating or scraping feeling inside the joint when you move it. Popping or creaking sounds when you move. Difficulty walking or exercising. An inability to grip items, twist your hand(s), or control the movements of your hands and fingers. How is this diagnosed? This condition may be diagnosed based on: Your medical history. A physical exam. Your symptoms. X-rays of the affected joint(s). Blood tests to rule out other types of arthritis. How is this treated? There is no cure for this condition, but treatment can help control pain and improve joint function.  Treatment may include a combination of therapies, such as: Pain relief techniques, such as: Applying heat and cold to the joint. Massage. A form of talk therapy called cognitive behavioral therapy (CBT). This therapy helps you set goals and follow up on the changes that you make. Medicines for pain and inflammation. The medicines can be taken by mouth or applied to the skin. They include: NSAIDs, such as ibuprofen. Prescription medicines. Strong anti-inflammatory medicines (corticosteroids). Certain nutritional supplements. A prescribed exercise program. You may work with a physical therapist. Assistive devices, such as a brace, wrap, splint, specialized glove, or cane. A weight control plan. Surgery, such as: An osteotomy. This is done to reposition the bones and relieve pain or to remove loose pieces of bone and cartilage. Joint replacement surgery. You may need this surgery if you have advanced osteoarthritis. Follow these instructions at home: Activity Rest your affected joints as told by your health care provider. Exercise as told by your health care provider. He or she may recommend specific types of exercise, such as: Strengthening exercises. These are done to strengthen the muscles that support joints affected by arthritis. Aerobic activities. These are exercises, such as brisk walking or water aerobics, that increase your heart rate. Range-of-motion activities. These help your joints move more easily. Balance and agility exercises. Managing pain, stiffness, and swelling   If directed, apply heat to the affected area as often as told by your health care provider. Use the heat source that your health care provider recommends, such as a moist heat pack or a heating pad. If you have a removable assistive device, remove it as told by   your health care provider. Place a towel between your skin and the heat source. If your health care provider tells you to keep the assistive device on  while you apply heat, place a towel between the assistive device and the heat source. Leave the heat on for 20-30 minutes. Remove the heat if your skin turns bright red. This is especially important if you are unable to feel pain, heat, or cold. You may have a greater risk of getting burned. If directed, put ice on the affected area. To do this: If you have a removable assistive device, remove it as told by your health care provider. Put ice in a plastic bag. Place a towel between your skin and the bag. If your health care provider tells you to keep the assistive device on during icing, place a towel between the assistive device and the bag. Leave the ice on for 20 minutes, 2-3 times a day. Move your fingers or toes often to reduce stiffness and swelling. Raise (elevate) the injured area above the level of your heart while you are sitting or lying down. General instructions Take over-the-counter and prescription medicines only as told by your health care provider. Maintain a healthy weight. Follow instructions from your health care provider for weight control. Do not use any products that contain nicotine or tobacco, such as cigarettes, e-cigarettes, and chewing tobacco. If you need help quitting, ask your health care provider. Use assistive devices as told by your health care provider. Keep all follow-up visits as told by your health care provider. This is important. Where to find more information National Institute of Arthritis and Musculoskeletal and Skin Diseases: www.niams.nih.gov National Institute on Aging: www.nia.nih.gov American College of Rheumatology: www.rheumatology.org Contact a health care provider if: You have redness, swelling, or a feeling of warmth in a joint that gets worse. You have a fever along with joint or muscle aches. You develop a rash. You have trouble doing your normal activities. Get help right away if: You have pain that gets worse and is not relieved by  pain medicine. Summary Osteoarthritis is a type of arthritis that affects tissue covering the ends of bones in joints (cartilage). This condition is caused by the wearing down of cartilage that covers the ends of bones. The main symptom of this condition is pain, swelling, and stiffness in the joint. There is no cure for this condition, but treatment can help control pain and improve joint function. This information is not intended to replace advice given to you by your health care provider. Make sure you discuss any questions you have with your health care provider. Document Revised: 08/13/2019 Document Reviewed: 08/13/2019 Elsevier Patient Education  2022 [employer].  
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
## 2021-06-18 ENCOUNTER — Encounter: Payer: Self-pay | Admitting: Family Medicine

## 2021-06-18 DIAGNOSIS — Z992 Dependence on renal dialysis: Secondary | ICD-10-CM | POA: Diagnosis not present

## 2021-06-18 DIAGNOSIS — Z23 Encounter for immunization: Secondary | ICD-10-CM | POA: Diagnosis not present

## 2021-06-18 DIAGNOSIS — D688 Other specified coagulation defects: Secondary | ICD-10-CM | POA: Diagnosis not present

## 2021-06-18 DIAGNOSIS — N186 End stage renal disease: Secondary | ICD-10-CM | POA: Diagnosis not present

## 2021-06-18 DIAGNOSIS — D472 Monoclonal gammopathy: Secondary | ICD-10-CM | POA: Diagnosis not present

## 2021-06-18 DIAGNOSIS — D509 Iron deficiency anemia, unspecified: Secondary | ICD-10-CM | POA: Diagnosis not present

## 2021-06-18 DIAGNOSIS — N2581 Secondary hyperparathyroidism of renal origin: Secondary | ICD-10-CM | POA: Diagnosis not present

## 2021-06-18 DIAGNOSIS — D631 Anemia in chronic kidney disease: Secondary | ICD-10-CM | POA: Diagnosis not present

## 2021-06-20 DIAGNOSIS — D631 Anemia in chronic kidney disease: Secondary | ICD-10-CM | POA: Diagnosis not present

## 2021-06-20 DIAGNOSIS — N186 End stage renal disease: Secondary | ICD-10-CM | POA: Diagnosis not present

## 2021-06-20 DIAGNOSIS — Z23 Encounter for immunization: Secondary | ICD-10-CM | POA: Diagnosis not present

## 2021-06-20 DIAGNOSIS — Z992 Dependence on renal dialysis: Secondary | ICD-10-CM | POA: Diagnosis not present

## 2021-06-20 DIAGNOSIS — D688 Other specified coagulation defects: Secondary | ICD-10-CM | POA: Diagnosis not present

## 2021-06-20 DIAGNOSIS — N2581 Secondary hyperparathyroidism of renal origin: Secondary | ICD-10-CM | POA: Diagnosis not present

## 2021-06-20 DIAGNOSIS — D509 Iron deficiency anemia, unspecified: Secondary | ICD-10-CM | POA: Diagnosis not present

## 2021-06-20 DIAGNOSIS — D472 Monoclonal gammopathy: Secondary | ICD-10-CM | POA: Diagnosis not present

## 2021-06-23 DIAGNOSIS — D688 Other specified coagulation defects: Secondary | ICD-10-CM | POA: Diagnosis not present

## 2021-06-23 DIAGNOSIS — D472 Monoclonal gammopathy: Secondary | ICD-10-CM | POA: Diagnosis not present

## 2021-06-23 DIAGNOSIS — N186 End stage renal disease: Secondary | ICD-10-CM | POA: Diagnosis not present

## 2021-06-23 DIAGNOSIS — N2581 Secondary hyperparathyroidism of renal origin: Secondary | ICD-10-CM | POA: Diagnosis not present

## 2021-06-23 DIAGNOSIS — Z23 Encounter for immunization: Secondary | ICD-10-CM | POA: Diagnosis not present

## 2021-06-23 DIAGNOSIS — Z992 Dependence on renal dialysis: Secondary | ICD-10-CM | POA: Diagnosis not present

## 2021-06-23 DIAGNOSIS — D509 Iron deficiency anemia, unspecified: Secondary | ICD-10-CM | POA: Diagnosis not present

## 2021-06-23 DIAGNOSIS — D631 Anemia in chronic kidney disease: Secondary | ICD-10-CM | POA: Diagnosis not present

## 2021-06-25 DIAGNOSIS — N186 End stage renal disease: Secondary | ICD-10-CM | POA: Diagnosis not present

## 2021-06-25 DIAGNOSIS — D688 Other specified coagulation defects: Secondary | ICD-10-CM | POA: Diagnosis not present

## 2021-06-25 DIAGNOSIS — N2581 Secondary hyperparathyroidism of renal origin: Secondary | ICD-10-CM | POA: Diagnosis not present

## 2021-06-25 DIAGNOSIS — D472 Monoclonal gammopathy: Secondary | ICD-10-CM | POA: Diagnosis not present

## 2021-06-25 DIAGNOSIS — D509 Iron deficiency anemia, unspecified: Secondary | ICD-10-CM | POA: Diagnosis not present

## 2021-06-25 DIAGNOSIS — D631 Anemia in chronic kidney disease: Secondary | ICD-10-CM | POA: Diagnosis not present

## 2021-06-25 DIAGNOSIS — Z23 Encounter for immunization: Secondary | ICD-10-CM | POA: Diagnosis not present

## 2021-06-25 DIAGNOSIS — Z992 Dependence on renal dialysis: Secondary | ICD-10-CM | POA: Diagnosis not present

## 2021-06-27 DIAGNOSIS — Z23 Encounter for immunization: Secondary | ICD-10-CM | POA: Diagnosis not present

## 2021-06-27 DIAGNOSIS — D688 Other specified coagulation defects: Secondary | ICD-10-CM | POA: Diagnosis not present

## 2021-06-27 DIAGNOSIS — N186 End stage renal disease: Secondary | ICD-10-CM | POA: Diagnosis not present

## 2021-06-27 DIAGNOSIS — D631 Anemia in chronic kidney disease: Secondary | ICD-10-CM | POA: Diagnosis not present

## 2021-06-27 DIAGNOSIS — D472 Monoclonal gammopathy: Secondary | ICD-10-CM | POA: Diagnosis not present

## 2021-06-27 DIAGNOSIS — Z992 Dependence on renal dialysis: Secondary | ICD-10-CM | POA: Diagnosis not present

## 2021-06-27 DIAGNOSIS — N2581 Secondary hyperparathyroidism of renal origin: Secondary | ICD-10-CM | POA: Diagnosis not present

## 2021-06-27 DIAGNOSIS — D509 Iron deficiency anemia, unspecified: Secondary | ICD-10-CM | POA: Diagnosis not present

## 2021-06-29 DIAGNOSIS — Z992 Dependence on renal dialysis: Secondary | ICD-10-CM | POA: Diagnosis not present

## 2021-06-29 DIAGNOSIS — E1129 Type 2 diabetes mellitus with other diabetic kidney complication: Secondary | ICD-10-CM | POA: Diagnosis not present

## 2021-06-29 DIAGNOSIS — N186 End stage renal disease: Secondary | ICD-10-CM | POA: Diagnosis not present

## 2021-06-30 DIAGNOSIS — Z992 Dependence on renal dialysis: Secondary | ICD-10-CM | POA: Diagnosis not present

## 2021-06-30 DIAGNOSIS — D509 Iron deficiency anemia, unspecified: Secondary | ICD-10-CM | POA: Diagnosis not present

## 2021-06-30 DIAGNOSIS — N186 End stage renal disease: Secondary | ICD-10-CM | POA: Diagnosis not present

## 2021-06-30 DIAGNOSIS — N2581 Secondary hyperparathyroidism of renal origin: Secondary | ICD-10-CM | POA: Diagnosis not present

## 2021-06-30 DIAGNOSIS — D688 Other specified coagulation defects: Secondary | ICD-10-CM | POA: Diagnosis not present

## 2021-06-30 DIAGNOSIS — D472 Monoclonal gammopathy: Secondary | ICD-10-CM | POA: Diagnosis not present

## 2021-07-02 DIAGNOSIS — D688 Other specified coagulation defects: Secondary | ICD-10-CM | POA: Diagnosis not present

## 2021-07-02 DIAGNOSIS — N2581 Secondary hyperparathyroidism of renal origin: Secondary | ICD-10-CM | POA: Diagnosis not present

## 2021-07-02 DIAGNOSIS — Z992 Dependence on renal dialysis: Secondary | ICD-10-CM | POA: Diagnosis not present

## 2021-07-02 DIAGNOSIS — N186 End stage renal disease: Secondary | ICD-10-CM | POA: Diagnosis not present

## 2021-07-02 DIAGNOSIS — D509 Iron deficiency anemia, unspecified: Secondary | ICD-10-CM | POA: Diagnosis not present

## 2021-07-02 DIAGNOSIS — D472 Monoclonal gammopathy: Secondary | ICD-10-CM | POA: Diagnosis not present

## 2021-07-04 DIAGNOSIS — N186 End stage renal disease: Secondary | ICD-10-CM | POA: Diagnosis not present

## 2021-07-04 DIAGNOSIS — D688 Other specified coagulation defects: Secondary | ICD-10-CM | POA: Diagnosis not present

## 2021-07-04 DIAGNOSIS — D472 Monoclonal gammopathy: Secondary | ICD-10-CM | POA: Diagnosis not present

## 2021-07-04 DIAGNOSIS — Z992 Dependence on renal dialysis: Secondary | ICD-10-CM | POA: Diagnosis not present

## 2021-07-04 DIAGNOSIS — N2581 Secondary hyperparathyroidism of renal origin: Secondary | ICD-10-CM | POA: Diagnosis not present

## 2021-07-04 DIAGNOSIS — D509 Iron deficiency anemia, unspecified: Secondary | ICD-10-CM | POA: Diagnosis not present

## 2021-07-07 DIAGNOSIS — Z992 Dependence on renal dialysis: Secondary | ICD-10-CM | POA: Diagnosis not present

## 2021-07-07 DIAGNOSIS — D472 Monoclonal gammopathy: Secondary | ICD-10-CM | POA: Diagnosis not present

## 2021-07-07 DIAGNOSIS — D688 Other specified coagulation defects: Secondary | ICD-10-CM | POA: Diagnosis not present

## 2021-07-07 DIAGNOSIS — N2581 Secondary hyperparathyroidism of renal origin: Secondary | ICD-10-CM | POA: Diagnosis not present

## 2021-07-07 DIAGNOSIS — D509 Iron deficiency anemia, unspecified: Secondary | ICD-10-CM | POA: Diagnosis not present

## 2021-07-07 DIAGNOSIS — N186 End stage renal disease: Secondary | ICD-10-CM | POA: Diagnosis not present

## 2021-07-09 DIAGNOSIS — N2581 Secondary hyperparathyroidism of renal origin: Secondary | ICD-10-CM | POA: Diagnosis not present

## 2021-07-09 DIAGNOSIS — D509 Iron deficiency anemia, unspecified: Secondary | ICD-10-CM | POA: Diagnosis not present

## 2021-07-09 DIAGNOSIS — D688 Other specified coagulation defects: Secondary | ICD-10-CM | POA: Diagnosis not present

## 2021-07-09 DIAGNOSIS — D472 Monoclonal gammopathy: Secondary | ICD-10-CM | POA: Diagnosis not present

## 2021-07-09 DIAGNOSIS — Z992 Dependence on renal dialysis: Secondary | ICD-10-CM | POA: Diagnosis not present

## 2021-07-09 DIAGNOSIS — N186 End stage renal disease: Secondary | ICD-10-CM | POA: Diagnosis not present

## 2021-07-11 DIAGNOSIS — D688 Other specified coagulation defects: Secondary | ICD-10-CM | POA: Diagnosis not present

## 2021-07-11 DIAGNOSIS — D472 Monoclonal gammopathy: Secondary | ICD-10-CM | POA: Diagnosis not present

## 2021-07-11 DIAGNOSIS — D509 Iron deficiency anemia, unspecified: Secondary | ICD-10-CM | POA: Diagnosis not present

## 2021-07-11 DIAGNOSIS — N2581 Secondary hyperparathyroidism of renal origin: Secondary | ICD-10-CM | POA: Diagnosis not present

## 2021-07-11 DIAGNOSIS — N186 End stage renal disease: Secondary | ICD-10-CM | POA: Diagnosis not present

## 2021-07-11 DIAGNOSIS — Z992 Dependence on renal dialysis: Secondary | ICD-10-CM | POA: Diagnosis not present

## 2021-07-13 ENCOUNTER — Inpatient Hospital Stay: Payer: Medicare Other

## 2021-07-13 ENCOUNTER — Inpatient Hospital Stay: Payer: Medicare Other | Attending: Internal Medicine | Admitting: Hematology

## 2021-07-13 ENCOUNTER — Other Ambulatory Visit: Payer: Self-pay

## 2021-07-13 VITALS — BP 91/64 | HR 72 | Temp 97.7°F | Resp 17

## 2021-07-13 DIAGNOSIS — N183 Chronic kidney disease, stage 3 unspecified: Secondary | ICD-10-CM | POA: Insufficient documentation

## 2021-07-13 DIAGNOSIS — D631 Anemia in chronic kidney disease: Secondary | ICD-10-CM | POA: Diagnosis not present

## 2021-07-13 DIAGNOSIS — E1122 Type 2 diabetes mellitus with diabetic chronic kidney disease: Secondary | ICD-10-CM | POA: Diagnosis not present

## 2021-07-13 DIAGNOSIS — D472 Monoclonal gammopathy: Secondary | ICD-10-CM

## 2021-07-13 DIAGNOSIS — I132 Hypertensive heart and chronic kidney disease with heart failure and with stage 5 chronic kidney disease, or end stage renal disease: Secondary | ICD-10-CM | POA: Diagnosis not present

## 2021-07-13 LAB — CBC WITH DIFFERENTIAL (CANCER CENTER ONLY)
Abs Immature Granulocytes: 0.01 10*3/uL (ref 0.00–0.07)
Basophils Absolute: 0.1 10*3/uL (ref 0.0–0.1)
Basophils Relative: 1 %
Eosinophils Absolute: 0.3 10*3/uL (ref 0.0–0.5)
Eosinophils Relative: 4 %
HCT: 34.2 % — ABNORMAL LOW (ref 39.0–52.0)
Hemoglobin: 11 g/dL — ABNORMAL LOW (ref 13.0–17.0)
Immature Granulocytes: 0 %
Lymphocytes Relative: 28 %
Lymphs Abs: 2.4 10*3/uL (ref 0.7–4.0)
MCH: 30.6 pg (ref 26.0–34.0)
MCHC: 32.2 g/dL (ref 30.0–36.0)
MCV: 95.3 fL (ref 80.0–100.0)
Monocytes Absolute: 0.6 10*3/uL (ref 0.1–1.0)
Monocytes Relative: 7 %
Neutro Abs: 5.2 10*3/uL (ref 1.7–7.7)
Neutrophils Relative %: 60 %
Platelet Count: 383 10*3/uL (ref 150–400)
RBC: 3.59 MIL/uL — ABNORMAL LOW (ref 4.22–5.81)
RDW: 14.7 % (ref 11.5–15.5)
WBC Count: 8.5 10*3/uL (ref 4.0–10.5)
nRBC: 0 % (ref 0.0–0.2)

## 2021-07-13 LAB — CMP (CANCER CENTER ONLY)
ALT: 9 U/L (ref 0–44)
AST: 11 U/L — ABNORMAL LOW (ref 15–41)
Albumin: 3.3 g/dL — ABNORMAL LOW (ref 3.5–5.0)
Alkaline Phosphatase: 113 U/L (ref 38–126)
Anion gap: 18 — ABNORMAL HIGH (ref 5–15)
BUN: 58 mg/dL — ABNORMAL HIGH (ref 8–23)
CO2: 27 mmol/L (ref 22–32)
Calcium: 10 mg/dL (ref 8.9–10.3)
Chloride: 96 mmol/L — ABNORMAL LOW (ref 98–111)
Creatinine: 11.14 mg/dL (ref 0.61–1.24)
GFR, Estimated: 5 mL/min — ABNORMAL LOW (ref >60)
Glucose, Bld: 204 mg/dL — ABNORMAL HIGH (ref 70–99)
Potassium: 3.6 mmol/L (ref 3.5–5.1)
Sodium: 141 mmol/L (ref 135–145)
Total Bilirubin: 0.3 mg/dL (ref 0.3–1.2)
Total Protein: 8.2 g/dL — ABNORMAL HIGH (ref 6.5–8.1)
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
## 2021-07-13 NOTE — Progress Notes (Signed)
Lab called reporting a critical lab value for Scr+ 11.14 today.  Pt is a renal pt.  Reported critical lab value to Dr. Burr Medico.

## 2021-07-14 DIAGNOSIS — D472 Monoclonal gammopathy: Secondary | ICD-10-CM | POA: Diagnosis not present

## 2021-07-14 DIAGNOSIS — N186 End stage renal disease: Secondary | ICD-10-CM | POA: Diagnosis not present

## 2021-07-14 DIAGNOSIS — Z992 Dependence on renal dialysis: Secondary | ICD-10-CM | POA: Diagnosis not present

## 2021-07-14 DIAGNOSIS — N2581 Secondary hyperparathyroidism of renal origin: Secondary | ICD-10-CM | POA: Diagnosis not present

## 2021-07-14 DIAGNOSIS — D688 Other specified coagulation defects: Secondary | ICD-10-CM | POA: Diagnosis not present

## 2021-07-14 DIAGNOSIS — D509 Iron deficiency anemia, unspecified: Secondary | ICD-10-CM | POA: Diagnosis not present

## 2021-07-15 LAB — KAPPA/LAMBDA LIGHT CHAINS
Kappa free light chain: 440.8 mg/L — ABNORMAL HIGH (ref 3.3–19.4)
Kappa, lambda light chain ratio: 3.05 — ABNORMAL HIGH (ref 0.26–1.65)
Lambda free light chains: 144.5 mg/L — ABNORMAL HIGH (ref 5.7–26.3)

## 2021-07-16 DIAGNOSIS — N2581 Secondary hyperparathyroidism of renal origin: Secondary | ICD-10-CM | POA: Diagnosis not present

## 2021-07-16 DIAGNOSIS — D472 Monoclonal gammopathy: Secondary | ICD-10-CM | POA: Diagnosis not present

## 2021-07-16 DIAGNOSIS — N186 End stage renal disease: Secondary | ICD-10-CM | POA: Diagnosis not present

## 2021-07-16 DIAGNOSIS — Z992 Dependence on renal dialysis: Secondary | ICD-10-CM | POA: Diagnosis not present

## 2021-07-16 DIAGNOSIS — D509 Iron deficiency anemia, unspecified: Secondary | ICD-10-CM | POA: Diagnosis not present

## 2021-07-16 DIAGNOSIS — D688 Other specified coagulation defects: Secondary | ICD-10-CM | POA: Diagnosis not present

## 2021-07-16 LAB — MULTIPLE MYELOMA PANEL, SERUM
Albumin SerPl Elph-Mcnc: 3.3 g/dL (ref 2.9–4.4)
Albumin/Glob SerPl: 0.9 (ref 0.7–1.7)
Alpha 1: 0.3 g/dL (ref 0.0–0.4)
Alpha2 Glob SerPl Elph-Mcnc: 0.9 g/dL (ref 0.4–1.0)
B-Globulin SerPl Elph-Mcnc: 1 g/dL (ref 0.7–1.3)
Gamma Glob SerPl Elph-Mcnc: 1.8 g/dL (ref 0.4–1.8)
Globulin, Total: 4 g/dL — ABNORMAL HIGH (ref 2.2–3.9)
IgA: 218 mg/dL (ref 61–437)
IgG (Immunoglobin G), Serum: 1715 mg/dL — ABNORMAL HIGH (ref 603–1613)
IgM (Immunoglobulin M), Srm: 382 mg/dL — ABNORMAL HIGH (ref 20–172)
M Protein SerPl Elph-Mcnc: 0.8 g/dL — ABNORMAL HIGH
Total Protein ELP: 7.3 g/dL (ref 6.0–8.5)

## 2021-07-18 DIAGNOSIS — N186 End stage renal disease: Secondary | ICD-10-CM | POA: Diagnosis not present

## 2021-07-18 DIAGNOSIS — D509 Iron deficiency anemia, unspecified: Secondary | ICD-10-CM | POA: Diagnosis not present

## 2021-07-18 DIAGNOSIS — N2581 Secondary hyperparathyroidism of renal origin: Secondary | ICD-10-CM | POA: Diagnosis not present

## 2021-07-18 DIAGNOSIS — D472 Monoclonal gammopathy: Secondary | ICD-10-CM | POA: Diagnosis not present

## 2021-07-18 DIAGNOSIS — D688 Other specified coagulation defects: Secondary | ICD-10-CM | POA: Diagnosis not present

## 2021-07-18 DIAGNOSIS — Z992 Dependence on renal dialysis: Secondary | ICD-10-CM | POA: Diagnosis not present

## 2021-07-21 ENCOUNTER — Other Ambulatory Visit (HOSPITAL_COMMUNITY): Payer: Medicare Other

## 2021-07-21 ENCOUNTER — Telehealth: Payer: Self-pay

## 2021-07-21 DIAGNOSIS — N2581 Secondary hyperparathyroidism of renal origin: Secondary | ICD-10-CM | POA: Diagnosis not present

## 2021-07-21 DIAGNOSIS — D472 Monoclonal gammopathy: Secondary | ICD-10-CM | POA: Diagnosis not present

## 2021-07-21 DIAGNOSIS — Z992 Dependence on renal dialysis: Secondary | ICD-10-CM | POA: Diagnosis not present

## 2021-07-21 DIAGNOSIS — D509 Iron deficiency anemia, unspecified: Secondary | ICD-10-CM | POA: Diagnosis not present

## 2021-07-21 DIAGNOSIS — N186 End stage renal disease: Secondary | ICD-10-CM | POA: Diagnosis not present

## 2021-07-21 DIAGNOSIS — D688 Other specified coagulation defects: Secondary | ICD-10-CM | POA: Diagnosis not present

## 2021-07-21 NOTE — Telephone Encounter (Signed)
This nurse reached out to patient related to below mentioned lab results and recommendations.  Left a message for patient to return call to clinic.  No other concerns at this time.

## 2021-07-21 NOTE — Telephone Encounter (Signed)
-----   Message from Truitt Merle, MD sent at 07/19/2021 12:15 PM EST ----- Please let pt know his lab results, M protein similar to 3 months ago, light chain levels also stable, no new concerns, continue monitoring  Truitt Merle  07/19/2021

## 2021-07-22 ENCOUNTER — Other Ambulatory Visit: Payer: Self-pay

## 2021-07-22 ENCOUNTER — Ambulatory Visit (HOSPITAL_COMMUNITY)
Admission: RE | Admit: 2021-07-22 | Discharge: 2021-07-22 | Disposition: A | Discharge status: Home / Self Care | Payer: Medicare Other | Source: Ambulatory Visit | Attending: Neurology | Admitting: Neurology

## 2021-07-22 DIAGNOSIS — I10 Essential (primary) hypertension: Secondary | ICD-10-CM | POA: Diagnosis not present

## 2021-07-22 DIAGNOSIS — R55 Syncope and collapse: Secondary | ICD-10-CM

## 2021-07-22 DIAGNOSIS — I700 Atherosclerosis of aorta: Secondary | ICD-10-CM | POA: Insufficient documentation

## 2021-07-22 LAB — ECHOCARDIOGRAM COMPLETE
AR max vel: 2.95 cm2
AV Area VTI: 3.45 cm2
AV Area mean vel: 2.6 cm2
AV Mean grad: 4 mmHg
AV Peak grad: 5.5 mmHg
Ao pk vel: 1.17 m/s
Area-P 1/2: 3.02 cm2
S' Lateral: 3 cm
Single Plane A4C EF: 77.5 %

## 2021-07-22 NOTE — Progress Notes (Signed)
  Echocardiogram 2D Echocardiogram has been performed.  Merrie Roof F 07/22/2021, 8:43 AM

## 2021-07-28 DIAGNOSIS — N186 End stage renal disease: Secondary | ICD-10-CM | POA: Diagnosis not present

## 2021-07-28 DIAGNOSIS — D509 Iron deficiency anemia, unspecified: Secondary | ICD-10-CM | POA: Diagnosis not present

## 2021-07-28 DIAGNOSIS — N2581 Secondary hyperparathyroidism of renal origin: Secondary | ICD-10-CM | POA: Diagnosis not present

## 2021-07-28 DIAGNOSIS — Z992 Dependence on renal dialysis: Secondary | ICD-10-CM | POA: Diagnosis not present

## 2021-07-28 DIAGNOSIS — D472 Monoclonal gammopathy: Secondary | ICD-10-CM | POA: Diagnosis not present

## 2021-07-28 DIAGNOSIS — D688 Other specified coagulation defects: Secondary | ICD-10-CM | POA: Diagnosis not present

## 2021-07-29 DIAGNOSIS — S88119A Complete traumatic amputation at level between knee and ankle, unspecified lower leg, initial encounter: Secondary | ICD-10-CM | POA: Diagnosis not present

## 2021-07-29 DIAGNOSIS — N186 End stage renal disease: Secondary | ICD-10-CM | POA: Diagnosis not present

## 2021-07-29 DIAGNOSIS — E1129 Type 2 diabetes mellitus with other diabetic kidney complication: Secondary | ICD-10-CM | POA: Diagnosis not present

## 2021-07-29 DIAGNOSIS — I5032 Chronic diastolic (congestive) heart failure: Secondary | ICD-10-CM | POA: Diagnosis not present

## 2021-07-29 DIAGNOSIS — M7542 Impingement syndrome of left shoulder: Secondary | ICD-10-CM | POA: Diagnosis not present

## 2021-07-29 DIAGNOSIS — M7541 Impingement syndrome of right shoulder: Secondary | ICD-10-CM | POA: Diagnosis not present

## 2021-07-29 DIAGNOSIS — N184 Chronic kidney disease, stage 4 (severe): Secondary | ICD-10-CM | POA: Diagnosis not present

## 2021-07-29 DIAGNOSIS — Z992 Dependence on renal dialysis: Secondary | ICD-10-CM | POA: Diagnosis not present

## 2021-07-30 DIAGNOSIS — D509 Iron deficiency anemia, unspecified: Secondary | ICD-10-CM | POA: Diagnosis not present

## 2021-07-30 DIAGNOSIS — N2581 Secondary hyperparathyroidism of renal origin: Secondary | ICD-10-CM | POA: Diagnosis not present

## 2021-07-30 DIAGNOSIS — D631 Anemia in chronic kidney disease: Secondary | ICD-10-CM | POA: Diagnosis not present

## 2021-07-30 DIAGNOSIS — D688 Other specified coagulation defects: Secondary | ICD-10-CM | POA: Diagnosis not present

## 2021-07-30 DIAGNOSIS — N186 End stage renal disease: Secondary | ICD-10-CM | POA: Diagnosis not present

## 2021-07-30 DIAGNOSIS — D472 Monoclonal gammopathy: Secondary | ICD-10-CM | POA: Diagnosis not present

## 2021-07-30 DIAGNOSIS — Z992 Dependence on renal dialysis: Secondary | ICD-10-CM | POA: Diagnosis not present

## 2021-08-01 DIAGNOSIS — N2581 Secondary hyperparathyroidism of renal origin: Secondary | ICD-10-CM | POA: Diagnosis not present

## 2021-08-01 DIAGNOSIS — D631 Anemia in chronic kidney disease: Secondary | ICD-10-CM | POA: Diagnosis not present

## 2021-08-01 DIAGNOSIS — N186 End stage renal disease: Secondary | ICD-10-CM | POA: Diagnosis not present

## 2021-08-01 DIAGNOSIS — D509 Iron deficiency anemia, unspecified: Secondary | ICD-10-CM | POA: Diagnosis not present

## 2021-08-01 DIAGNOSIS — Z992 Dependence on renal dialysis: Secondary | ICD-10-CM | POA: Diagnosis not present

## 2021-08-01 DIAGNOSIS — D472 Monoclonal gammopathy: Secondary | ICD-10-CM | POA: Diagnosis not present

## 2021-08-01 DIAGNOSIS — D688 Other specified coagulation defects: Secondary | ICD-10-CM | POA: Diagnosis not present

## 2021-08-04 DIAGNOSIS — N186 End stage renal disease: Secondary | ICD-10-CM | POA: Diagnosis not present

## 2021-08-04 DIAGNOSIS — Z992 Dependence on renal dialysis: Secondary | ICD-10-CM | POA: Diagnosis not present

## 2021-08-04 DIAGNOSIS — D509 Iron deficiency anemia, unspecified: Secondary | ICD-10-CM | POA: Diagnosis not present

## 2021-08-04 DIAGNOSIS — D631 Anemia in chronic kidney disease: Secondary | ICD-10-CM | POA: Diagnosis not present

## 2021-08-04 DIAGNOSIS — N2581 Secondary hyperparathyroidism of renal origin: Secondary | ICD-10-CM | POA: Diagnosis not present

## 2021-08-04 DIAGNOSIS — D472 Monoclonal gammopathy: Secondary | ICD-10-CM | POA: Diagnosis not present

## 2021-08-04 DIAGNOSIS — D688 Other specified coagulation defects: Secondary | ICD-10-CM | POA: Diagnosis not present

## 2021-08-05 ENCOUNTER — Encounter: Payer: Self-pay | Admitting: *Deleted

## 2021-08-06 DIAGNOSIS — D472 Monoclonal gammopathy: Secondary | ICD-10-CM | POA: Diagnosis not present

## 2021-08-06 DIAGNOSIS — Z992 Dependence on renal dialysis: Secondary | ICD-10-CM | POA: Diagnosis not present

## 2021-08-06 DIAGNOSIS — D631 Anemia in chronic kidney disease: Secondary | ICD-10-CM | POA: Diagnosis not present

## 2021-08-06 DIAGNOSIS — N186 End stage renal disease: Secondary | ICD-10-CM | POA: Diagnosis not present

## 2021-08-06 DIAGNOSIS — D688 Other specified coagulation defects: Secondary | ICD-10-CM | POA: Diagnosis not present

## 2021-08-06 DIAGNOSIS — D509 Iron deficiency anemia, unspecified: Secondary | ICD-10-CM | POA: Diagnosis not present

## 2021-08-06 DIAGNOSIS — N2581 Secondary hyperparathyroidism of renal origin: Secondary | ICD-10-CM | POA: Diagnosis not present

## 2021-08-08 DIAGNOSIS — N186 End stage renal disease: Secondary | ICD-10-CM | POA: Diagnosis not present

## 2021-08-08 DIAGNOSIS — D631 Anemia in chronic kidney disease: Secondary | ICD-10-CM | POA: Diagnosis not present

## 2021-08-08 DIAGNOSIS — Z992 Dependence on renal dialysis: Secondary | ICD-10-CM | POA: Diagnosis not present

## 2021-08-08 DIAGNOSIS — D688 Other specified coagulation defects: Secondary | ICD-10-CM | POA: Diagnosis not present

## 2021-08-08 DIAGNOSIS — D472 Monoclonal gammopathy: Secondary | ICD-10-CM | POA: Diagnosis not present

## 2021-08-08 DIAGNOSIS — N2581 Secondary hyperparathyroidism of renal origin: Secondary | ICD-10-CM | POA: Diagnosis not present

## 2021-08-08 DIAGNOSIS — D509 Iron deficiency anemia, unspecified: Secondary | ICD-10-CM | POA: Diagnosis not present

## 2021-08-11 DIAGNOSIS — D509 Iron deficiency anemia, unspecified: Secondary | ICD-10-CM | POA: Diagnosis not present

## 2021-08-11 DIAGNOSIS — Z992 Dependence on renal dialysis: Secondary | ICD-10-CM | POA: Diagnosis not present

## 2021-08-11 DIAGNOSIS — D688 Other specified coagulation defects: Secondary | ICD-10-CM | POA: Diagnosis not present

## 2021-08-11 DIAGNOSIS — N2581 Secondary hyperparathyroidism of renal origin: Secondary | ICD-10-CM | POA: Diagnosis not present

## 2021-08-11 DIAGNOSIS — N186 End stage renal disease: Secondary | ICD-10-CM | POA: Diagnosis not present

## 2021-08-11 DIAGNOSIS — D472 Monoclonal gammopathy: Secondary | ICD-10-CM | POA: Diagnosis not present

## 2021-08-11 DIAGNOSIS — D631 Anemia in chronic kidney disease: Secondary | ICD-10-CM | POA: Diagnosis not present

## 2021-08-13 DIAGNOSIS — D472 Monoclonal gammopathy: Secondary | ICD-10-CM | POA: Diagnosis not present

## 2021-08-13 DIAGNOSIS — D631 Anemia in chronic kidney disease: Secondary | ICD-10-CM | POA: Diagnosis not present

## 2021-08-13 DIAGNOSIS — D509 Iron deficiency anemia, unspecified: Secondary | ICD-10-CM | POA: Diagnosis not present

## 2021-08-13 DIAGNOSIS — Z992 Dependence on renal dialysis: Secondary | ICD-10-CM | POA: Diagnosis not present

## 2021-08-13 DIAGNOSIS — N2581 Secondary hyperparathyroidism of renal origin: Secondary | ICD-10-CM | POA: Diagnosis not present

## 2021-08-13 DIAGNOSIS — N186 End stage renal disease: Secondary | ICD-10-CM | POA: Diagnosis not present

## 2021-08-13 DIAGNOSIS — D688 Other specified coagulation defects: Secondary | ICD-10-CM | POA: Diagnosis not present

## 2021-08-18 DIAGNOSIS — Z992 Dependence on renal dialysis: Secondary | ICD-10-CM | POA: Diagnosis not present

## 2021-08-18 DIAGNOSIS — N186 End stage renal disease: Secondary | ICD-10-CM | POA: Diagnosis not present

## 2021-08-18 DIAGNOSIS — N2581 Secondary hyperparathyroidism of renal origin: Secondary | ICD-10-CM | POA: Diagnosis not present

## 2021-08-18 DIAGNOSIS — D631 Anemia in chronic kidney disease: Secondary | ICD-10-CM | POA: Diagnosis not present

## 2021-08-18 DIAGNOSIS — D472 Monoclonal gammopathy: Secondary | ICD-10-CM | POA: Diagnosis not present

## 2021-08-18 DIAGNOSIS — D509 Iron deficiency anemia, unspecified: Secondary | ICD-10-CM | POA: Diagnosis not present

## 2021-08-18 DIAGNOSIS — D688 Other specified coagulation defects: Secondary | ICD-10-CM | POA: Diagnosis not present

## 2021-08-25 DIAGNOSIS — D472 Monoclonal gammopathy: Secondary | ICD-10-CM | POA: Diagnosis not present

## 2021-08-25 DIAGNOSIS — D688 Other specified coagulation defects: Secondary | ICD-10-CM | POA: Diagnosis not present

## 2021-08-25 DIAGNOSIS — N2581 Secondary hyperparathyroidism of renal origin: Secondary | ICD-10-CM | POA: Diagnosis not present

## 2021-08-25 DIAGNOSIS — N186 End stage renal disease: Secondary | ICD-10-CM | POA: Diagnosis not present

## 2021-08-25 DIAGNOSIS — D631 Anemia in chronic kidney disease: Secondary | ICD-10-CM | POA: Diagnosis not present

## 2021-08-25 DIAGNOSIS — Z992 Dependence on renal dialysis: Secondary | ICD-10-CM | POA: Diagnosis not present

## 2021-08-25 DIAGNOSIS — D509 Iron deficiency anemia, unspecified: Secondary | ICD-10-CM | POA: Diagnosis not present

## 2021-08-29 DIAGNOSIS — Z992 Dependence on renal dialysis: Secondary | ICD-10-CM | POA: Diagnosis not present

## 2021-08-29 DIAGNOSIS — E1129 Type 2 diabetes mellitus with other diabetic kidney complication: Secondary | ICD-10-CM | POA: Diagnosis not present

## 2021-08-29 DIAGNOSIS — N186 End stage renal disease: Secondary | ICD-10-CM | POA: Diagnosis not present

## 2021-08-30 ENCOUNTER — Emergency Department (HOSPITAL_COMMUNITY): Payer: Medicare Other

## 2021-08-30 ENCOUNTER — Encounter (HOSPITAL_COMMUNITY): Payer: Self-pay | Admitting: *Deleted

## 2021-08-30 ENCOUNTER — Encounter (HOSPITAL_COMMUNITY): Payer: Self-pay

## 2021-08-30 ENCOUNTER — Other Ambulatory Visit: Payer: Self-pay

## 2021-08-30 ENCOUNTER — Inpatient Hospital Stay (HOSPITAL_COMMUNITY)
Admission: EM | Admit: 2021-08-30 | Discharge: 2021-09-02 | DRG: 193 | Disposition: A | Discharge status: Home / Self Care | Payer: Medicare Other | Attending: Internal Medicine | Admitting: Internal Medicine

## 2021-08-30 DIAGNOSIS — J918 Pleural effusion in other conditions classified elsewhere: Secondary | ICD-10-CM | POA: Diagnosis not present

## 2021-08-30 DIAGNOSIS — Z9081 Acquired absence of spleen: Secondary | ICD-10-CM

## 2021-08-30 DIAGNOSIS — C9000 Multiple myeloma not having achieved remission: Secondary | ICD-10-CM | POA: Diagnosis not present

## 2021-08-30 DIAGNOSIS — Z833 Family history of diabetes mellitus: Secondary | ICD-10-CM | POA: Diagnosis not present

## 2021-08-30 DIAGNOSIS — D631 Anemia in chronic kidney disease: Secondary | ICD-10-CM | POA: Diagnosis not present

## 2021-08-30 DIAGNOSIS — E669 Obesity, unspecified: Secondary | ICD-10-CM | POA: Diagnosis present

## 2021-08-30 DIAGNOSIS — E782 Mixed hyperlipidemia: Secondary | ICD-10-CM

## 2021-08-30 DIAGNOSIS — M1990 Unspecified osteoarthritis, unspecified site: Secondary | ICD-10-CM | POA: Diagnosis present

## 2021-08-30 DIAGNOSIS — I951 Orthostatic hypotension: Secondary | ICD-10-CM | POA: Diagnosis not present

## 2021-08-30 DIAGNOSIS — E1151 Type 2 diabetes mellitus with diabetic peripheral angiopathy without gangrene: Secondary | ICD-10-CM | POA: Diagnosis present

## 2021-08-30 DIAGNOSIS — Z8619 Personal history of other infectious and parasitic diseases: Secondary | ICD-10-CM

## 2021-08-30 DIAGNOSIS — Z79899 Other long term (current) drug therapy: Secondary | ICD-10-CM

## 2021-08-30 DIAGNOSIS — Z20822 Contact with and (suspected) exposure to covid-19: Secondary | ICD-10-CM | POA: Diagnosis present

## 2021-08-30 DIAGNOSIS — Z9115 Patient's noncompliance with renal dialysis: Secondary | ICD-10-CM

## 2021-08-30 DIAGNOSIS — Z992 Dependence on renal dialysis: Secondary | ICD-10-CM

## 2021-08-30 DIAGNOSIS — E1122 Type 2 diabetes mellitus with diabetic chronic kidney disease: Secondary | ICD-10-CM | POA: Diagnosis present

## 2021-08-30 DIAGNOSIS — Z91199 Patient's noncompliance with other medical treatment and regimen due to unspecified reason: Secondary | ICD-10-CM

## 2021-08-30 DIAGNOSIS — G40909 Epilepsy, unspecified, not intractable, without status epilepticus: Secondary | ICD-10-CM | POA: Diagnosis present

## 2021-08-30 DIAGNOSIS — R918 Other nonspecific abnormal finding of lung field: Secondary | ICD-10-CM | POA: Diagnosis not present

## 2021-08-30 DIAGNOSIS — J90 Pleural effusion, not elsewhere classified: Secondary | ICD-10-CM | POA: Diagnosis not present

## 2021-08-30 DIAGNOSIS — R0602 Shortness of breath: Secondary | ICD-10-CM | POA: Diagnosis not present

## 2021-08-30 DIAGNOSIS — Z6832 Body mass index (BMI) 32.0-32.9, adult: Secondary | ICD-10-CM

## 2021-08-30 DIAGNOSIS — Z8249 Family history of ischemic heart disease and other diseases of the circulatory system: Secondary | ICD-10-CM

## 2021-08-30 DIAGNOSIS — E1169 Type 2 diabetes mellitus with other specified complication: Secondary | ICD-10-CM | POA: Diagnosis not present

## 2021-08-30 DIAGNOSIS — I132 Hypertensive heart and chronic kidney disease with heart failure and with stage 5 chronic kidney disease, or end stage renal disease: Secondary | ICD-10-CM | POA: Diagnosis present

## 2021-08-30 DIAGNOSIS — D638 Anemia in other chronic diseases classified elsewhere: Secondary | ICD-10-CM | POA: Diagnosis not present

## 2021-08-30 DIAGNOSIS — R7989 Other specified abnormal findings of blood chemistry: Secondary | ICD-10-CM | POA: Diagnosis not present

## 2021-08-30 DIAGNOSIS — Z794 Long term (current) use of insulin: Secondary | ICD-10-CM

## 2021-08-30 DIAGNOSIS — N186 End stage renal disease: Secondary | ICD-10-CM | POA: Diagnosis present

## 2021-08-30 DIAGNOSIS — Z87891 Personal history of nicotine dependence: Secondary | ICD-10-CM

## 2021-08-30 DIAGNOSIS — Z809 Family history of malignant neoplasm, unspecified: Secondary | ICD-10-CM

## 2021-08-30 DIAGNOSIS — F1411 Cocaine abuse, in remission: Secondary | ICD-10-CM | POA: Diagnosis present

## 2021-08-30 DIAGNOSIS — I10 Essential (primary) hypertension: Secondary | ICD-10-CM | POA: Diagnosis present

## 2021-08-30 DIAGNOSIS — I5032 Chronic diastolic (congestive) heart failure: Secondary | ICD-10-CM | POA: Diagnosis present

## 2021-08-30 DIAGNOSIS — J189 Pneumonia, unspecified organism: Principal | ICD-10-CM | POA: Diagnosis present

## 2021-08-30 DIAGNOSIS — M549 Dorsalgia, unspecified: Secondary | ICD-10-CM | POA: Diagnosis present

## 2021-08-30 DIAGNOSIS — K219 Gastro-esophageal reflux disease without esophagitis: Secondary | ICD-10-CM | POA: Diagnosis present

## 2021-08-30 DIAGNOSIS — Z89512 Acquired absence of left leg below knee: Secondary | ICD-10-CM

## 2021-08-30 DIAGNOSIS — E785 Hyperlipidemia, unspecified: Secondary | ICD-10-CM | POA: Diagnosis present

## 2021-08-30 DIAGNOSIS — R059 Cough, unspecified: Secondary | ICD-10-CM | POA: Diagnosis not present

## 2021-08-30 DIAGNOSIS — M898X9 Other specified disorders of bone, unspecified site: Secondary | ICD-10-CM

## 2021-08-30 HISTORY — DX: Multiple myeloma not having achieved remission: C90.00

## 2021-08-30 LAB — COMPREHENSIVE METABOLIC PANEL
ALT: 8 U/L (ref 0–44)
AST: 17 U/L (ref 15–41)
Albumin: 3.2 g/dL — ABNORMAL LOW (ref 3.5–5.0)
Alkaline Phosphatase: 77 U/L (ref 38–126)
Anion gap: 13 (ref 5–15)
BUN: 101 mg/dL — ABNORMAL HIGH (ref 8–23)
CO2: 20 mmol/L — ABNORMAL LOW (ref 22–32)
Calcium: 8.8 mg/dL — ABNORMAL LOW (ref 8.9–10.3)
Chloride: 107 mmol/L (ref 98–111)
Creatinine, Ser: 17 mg/dL — ABNORMAL HIGH (ref 0.61–1.24)
GFR, Estimated: 3 mL/min — ABNORMAL LOW (ref >60)
Glucose, Bld: 134 mg/dL — ABNORMAL HIGH (ref 70–99)
Potassium: 3.9 mmol/L (ref 3.5–5.1)
Sodium: 140 mmol/L (ref 135–145)
Total Bilirubin: 0.9 mg/dL (ref 0.3–1.2)
Total Protein: 7.3 g/dL (ref 6.5–8.1)

## 2021-08-30 LAB — CBC WITH DIFFERENTIAL/PLATELET
Abs Immature Granulocytes: 0.05 10*3/uL (ref 0.00–0.07)
Basophils Absolute: 0.1 10*3/uL (ref 0.0–0.1)
Basophils Relative: 1 %
Eosinophils Absolute: 0.4 10*3/uL (ref 0.0–0.5)
Eosinophils Relative: 3 %
HCT: 29.2 % — ABNORMAL LOW (ref 39.0–52.0)
Hemoglobin: 9.6 g/dL — ABNORMAL LOW (ref 13.0–17.0)
Immature Granulocytes: 0 %
Lymphocytes Relative: 26 %
Lymphs Abs: 2.9 10*3/uL (ref 0.7–4.0)
MCH: 31.4 pg (ref 26.0–34.0)
MCHC: 32.9 g/dL (ref 30.0–36.0)
MCV: 95.4 fL (ref 80.0–100.0)
Monocytes Absolute: 1 10*3/uL (ref 0.1–1.0)
Monocytes Relative: 9 %
Neutro Abs: 6.8 10*3/uL (ref 1.7–7.7)
Neutrophils Relative %: 61 %
Platelets: 410 10*3/uL — ABNORMAL HIGH (ref 150–400)
RBC: 3.06 MIL/uL — ABNORMAL LOW (ref 4.22–5.81)
RDW: 15.2 % (ref 11.5–15.5)
WBC: 11.2 10*3/uL — ABNORMAL HIGH (ref 4.0–10.5)
nRBC: 0 % (ref 0.0–0.2)

## 2021-08-30 LAB — RESP PANEL BY RT-PCR (FLU A&B, COVID) ARPGX2
Influenza A by PCR: NEGATIVE
Influenza B by PCR: NEGATIVE
SARS Coronavirus 2 by RT PCR: NEGATIVE

## 2021-08-30 NOTE — ED Provider Notes (Signed)
Emergency Medicine Provider Triage Evaluation Note  Mark [name] , a 71 y.o. male  was evaluated in triage.  Pt complains of productive cough with yellow sputum, right mid back pain, headache, fatigue, intermittent palpitations, and shortness of breath for the last week with single episode of diarrhea yesterday.  No known sick contacts.  Review of Systems  Positive: Cough, chills, fatigue, mid back pain, headache, palpitation Negative: Chest pain, vomiting, fevers  Physical Exam  BP 138/87 (BP Location: Right Arm)    Pulse 90    Temp 98.5 F (36.9 C) (Oral)    Resp 20    Ht 6\' 2"  (1.88 m)    Wt 108.9 kg    SpO2 100%    BMI 30.81 kg/m  Gen:   Awake, no distress   Resp:  Normal effort  MSK:   Moves extremities without difficulty  Other:  Right lower lobe rhonchi/rales, patient is tachypneic, coughing throughout exam.  RRR no M/R/G.  Medical Decision Making  Medically screening exam initiated at 2:06 PM.  Appropriate orders placed.  [name] was informed that the remainder of the evaluation will be completed by another provider, this initial triage assessment does not replace that evaluation, and the importance of remaining in the ED until their evaluation is complete.  This chart was dictated using voice recognition software, Dragon. Despite the best efforts of this provider to proofread and correct errors, errors may still occur which can change documentation meaning.    Emeline Darling, PA-C 08/30/21 1407    Daleen Bo, MD 08/30/21 1946

## 2021-08-30 NOTE — ED Notes (Signed)
Patient did not allow to reassess vital signs.

## 2021-08-30 NOTE — ED Triage Notes (Signed)
Patient c/o a productive cough with yellow sputum, m,id back pain, and a headache x 1 week. Patient states no BM in 3 days.

## 2021-08-31 ENCOUNTER — Encounter (HOSPITAL_COMMUNITY): Payer: Self-pay | Admitting: Emergency Medicine

## 2021-08-31 DIAGNOSIS — N186 End stage renal disease: Secondary | ICD-10-CM | POA: Diagnosis not present

## 2021-08-31 DIAGNOSIS — Z833 Family history of diabetes mellitus: Secondary | ICD-10-CM | POA: Diagnosis not present

## 2021-08-31 DIAGNOSIS — J90 Pleural effusion, not elsewhere classified: Secondary | ICD-10-CM

## 2021-08-31 DIAGNOSIS — R059 Cough, unspecified: Secondary | ICD-10-CM | POA: Diagnosis present

## 2021-08-31 DIAGNOSIS — J189 Pneumonia, unspecified organism: Principal | ICD-10-CM

## 2021-08-31 DIAGNOSIS — C9000 Multiple myeloma not having achieved remission: Secondary | ICD-10-CM | POA: Diagnosis not present

## 2021-08-31 DIAGNOSIS — Z20822 Contact with and (suspected) exposure to covid-19: Secondary | ICD-10-CM | POA: Diagnosis not present

## 2021-08-31 DIAGNOSIS — I10 Essential (primary) hypertension: Secondary | ICD-10-CM

## 2021-08-31 DIAGNOSIS — M898X9 Other specified disorders of bone, unspecified site: Secondary | ICD-10-CM | POA: Diagnosis not present

## 2021-08-31 DIAGNOSIS — Z8249 Family history of ischemic heart disease and other diseases of the circulatory system: Secondary | ICD-10-CM | POA: Diagnosis not present

## 2021-08-31 DIAGNOSIS — Z8619 Personal history of other infectious and parasitic diseases: Secondary | ICD-10-CM | POA: Diagnosis not present

## 2021-08-31 DIAGNOSIS — I132 Hypertensive heart and chronic kidney disease with heart failure and with stage 5 chronic kidney disease, or end stage renal disease: Secondary | ICD-10-CM | POA: Diagnosis not present

## 2021-08-31 DIAGNOSIS — E669 Obesity, unspecified: Secondary | ICD-10-CM | POA: Diagnosis present

## 2021-08-31 DIAGNOSIS — N250 Renal osteodystrophy: Secondary | ICD-10-CM | POA: Diagnosis not present

## 2021-08-31 DIAGNOSIS — G40909 Epilepsy, unspecified, not intractable, without status epilepticus: Secondary | ICD-10-CM | POA: Diagnosis present

## 2021-08-31 DIAGNOSIS — E785 Hyperlipidemia, unspecified: Secondary | ICD-10-CM | POA: Diagnosis not present

## 2021-08-31 DIAGNOSIS — R0602 Shortness of breath: Secondary | ICD-10-CM | POA: Diagnosis not present

## 2021-08-31 DIAGNOSIS — K219 Gastro-esophageal reflux disease without esophagitis: Secondary | ICD-10-CM | POA: Diagnosis not present

## 2021-08-31 DIAGNOSIS — D638 Anemia in other chronic diseases classified elsewhere: Secondary | ICD-10-CM | POA: Diagnosis not present

## 2021-08-31 DIAGNOSIS — E1122 Type 2 diabetes mellitus with diabetic chronic kidney disease: Secondary | ICD-10-CM

## 2021-08-31 DIAGNOSIS — E1151 Type 2 diabetes mellitus with diabetic peripheral angiopathy without gangrene: Secondary | ICD-10-CM | POA: Diagnosis not present

## 2021-08-31 DIAGNOSIS — E1169 Type 2 diabetes mellitus with other specified complication: Secondary | ICD-10-CM | POA: Diagnosis not present

## 2021-08-31 DIAGNOSIS — I5032 Chronic diastolic (congestive) heart failure: Secondary | ICD-10-CM

## 2021-08-31 DIAGNOSIS — Z992 Dependence on renal dialysis: Secondary | ICD-10-CM | POA: Diagnosis not present

## 2021-08-31 DIAGNOSIS — Z809 Family history of malignant neoplasm, unspecified: Secondary | ICD-10-CM | POA: Diagnosis not present

## 2021-08-31 DIAGNOSIS — M549 Dorsalgia, unspecified: Secondary | ICD-10-CM | POA: Diagnosis present

## 2021-08-31 DIAGNOSIS — F1411 Cocaine abuse, in remission: Secondary | ICD-10-CM | POA: Diagnosis present

## 2021-08-31 DIAGNOSIS — Z6832 Body mass index (BMI) 32.0-32.9, adult: Secondary | ICD-10-CM | POA: Diagnosis not present

## 2021-08-31 DIAGNOSIS — E782 Mixed hyperlipidemia: Secondary | ICD-10-CM

## 2021-08-31 DIAGNOSIS — I951 Orthostatic hypotension: Secondary | ICD-10-CM | POA: Diagnosis not present

## 2021-08-31 DIAGNOSIS — I120 Hypertensive chronic kidney disease with stage 5 chronic kidney disease or end stage renal disease: Secondary | ICD-10-CM | POA: Diagnosis not present

## 2021-08-31 DIAGNOSIS — D631 Anemia in chronic kidney disease: Secondary | ICD-10-CM | POA: Diagnosis not present

## 2021-08-31 DIAGNOSIS — Z87891 Personal history of nicotine dependence: Secondary | ICD-10-CM | POA: Diagnosis not present

## 2021-08-31 DIAGNOSIS — E1129 Type 2 diabetes mellitus with other diabetic kidney complication: Secondary | ICD-10-CM | POA: Diagnosis not present

## 2021-08-31 LAB — CBC WITH DIFFERENTIAL/PLATELET
Abs Immature Granulocytes: 0.05 10*3/uL (ref 0.00–0.07)
Basophils Absolute: 0.1 10*3/uL (ref 0.0–0.1)
Basophils Relative: 1 %
Eosinophils Absolute: 0.4 10*3/uL (ref 0.0–0.5)
Eosinophils Relative: 3 %
HCT: 29.3 % — ABNORMAL LOW (ref 39.0–52.0)
Hemoglobin: 9.7 g/dL — ABNORMAL LOW (ref 13.0–17.0)
Immature Granulocytes: 0 %
Lymphocytes Relative: 49 %
Lymphs Abs: 7 10*3/uL — ABNORMAL HIGH (ref 0.7–4.0)
MCH: 31.3 pg (ref 26.0–34.0)
MCHC: 33.1 g/dL (ref 30.0–36.0)
MCV: 94.5 fL (ref 80.0–100.0)
Monocytes Absolute: 1.1 10*3/uL — ABNORMAL HIGH (ref 0.1–1.0)
Monocytes Relative: 8 %
Neutro Abs: 5.6 10*3/uL (ref 1.7–7.7)
Neutrophils Relative %: 39 %
Platelets: 439 10*3/uL — ABNORMAL HIGH (ref 150–400)
RBC: 3.1 MIL/uL — ABNORMAL LOW (ref 4.22–5.81)
RDW: 15.3 % (ref 11.5–15.5)
WBC: 14.3 10*3/uL — ABNORMAL HIGH (ref 4.0–10.5)
nRBC: 0 % (ref 0.0–0.2)

## 2021-08-31 LAB — RENAL FUNCTION PANEL
Albumin: 3 g/dL — ABNORMAL LOW (ref 3.5–5.0)
Anion gap: 13 (ref 5–15)
BUN: 111 mg/dL — ABNORMAL HIGH (ref 8–23)
CO2: 21 mmol/L — ABNORMAL LOW (ref 22–32)
Calcium: 8.7 mg/dL — ABNORMAL LOW (ref 8.9–10.3)
Chloride: 108 mmol/L (ref 98–111)
Creatinine, Ser: 18.39 mg/dL — ABNORMAL HIGH (ref 0.61–1.24)
GFR, Estimated: 2 mL/min — ABNORMAL LOW (ref >60)
Glucose, Bld: 96 mg/dL (ref 70–99)
Phosphorus: 7.5 mg/dL — ABNORMAL HIGH (ref 2.5–4.6)
Potassium: 3.8 mmol/L (ref 3.5–5.1)
Sodium: 142 mmol/L (ref 135–145)

## 2021-08-31 LAB — URINALYSIS, ROUTINE W REFLEX MICROSCOPIC
Bacteria, UA: NONE SEEN
Bilirubin Urine: NEGATIVE
Glucose, UA: >=500 mg/dL — AB
Ketones, ur: 5 mg/dL — AB
Leukocytes,Ua: NEGATIVE
Nitrite: NEGATIVE
Protein, ur: 100 mg/dL — AB
Specific Gravity, Urine: 1.009 (ref 1.005–1.030)
pH: 8 (ref 5.0–8.0)

## 2021-08-31 LAB — CBG MONITORING, ED
Glucose-Capillary: 95 mg/dL (ref 70–99)
Glucose-Capillary: 115 mg/dL — ABNORMAL HIGH (ref 70–99)

## 2021-08-31 LAB — GLUCOSE, CAPILLARY: Glucose-Capillary: 138 mg/dL — ABNORMAL HIGH (ref 70–99)

## 2021-08-31 LAB — EXPECTORATED SPUTUM ASSESSMENT W GRAM STAIN, RFLX TO RESP C

## 2021-08-31 LAB — HEMOGLOBIN A1C
Hgb A1c MFr Bld: 8.5 % — ABNORMAL HIGH (ref 4.8–5.6)
Mean Plasma Glucose: 197.25 mg/dL

## 2021-08-31 LAB — STREP PNEUMONIAE URINARY ANTIGEN: Strep Pneumo Urinary Antigen: NEGATIVE

## 2021-08-31 MED ORDER — ACETAMINOPHEN 650 MG RE SUPP: 650.0000 mg | Freq: Four times a day (QID) | RECTAL | Status: DC | PRN

## 2021-08-31 MED ORDER — SODIUM CHLORIDE 0.9 % IV SOLN
500.0000 mg | INTRAVENOUS | Status: DC
  Administered 2021-09-01 – 2021-09-02 (×2): 500 mg via INTRAVENOUS
  Filled 2021-08-31 (×2): qty 5

## 2021-08-31 MED ORDER — ALTEPLASE 2 MG IJ SOLR: 2.0000 mg | Freq: Once | INTRAMUSCULAR | Status: DC | PRN

## 2021-08-31 MED ORDER — CINACALCET HCL 30 MG PO TABS
30.0000 mg | ORAL_TABLET | ORAL | Status: DC
  Administered 2021-09-01: 30 mg via ORAL
  Filled 2021-08-31: qty 1

## 2021-08-31 MED ORDER — CHLORHEXIDINE GLUCONATE CLOTH 2 % EX PADS: 6.0000 | MEDICATED_PAD | Freq: Every day | CUTANEOUS | Status: DC

## 2021-08-31 MED ORDER — ALBUTEROL SULFATE (2.5 MG/3ML) 0.083% IN NEBU: 2.5000 mg | INHALATION_SOLUTION | RESPIRATORY_TRACT | Status: DC | PRN

## 2021-08-31 MED ORDER — INSULIN ASPART 100 UNIT/ML IJ SOLN
0.0000 [IU] | Freq: Three times a day (TID) | INTRAMUSCULAR | Status: DC
  Administered 2021-09-01: 2 [IU] via SUBCUTANEOUS
  Filled 2021-08-31: qty 0.06

## 2021-08-31 MED ORDER — CALCITRIOL 0.25 MCG PO CAPS
0.7500 ug | ORAL_CAPSULE | ORAL | Status: DC
  Administered 2021-09-01: 0.75 ug via ORAL
  Filled 2021-08-31: qty 3

## 2021-08-31 MED ORDER — GUAIFENESIN-CODEINE 100-10 MG/5ML PO SOLN
10.0000 mL | Freq: Four times a day (QID) | ORAL | Status: DC | PRN
Start: 2021-08-31 — End: 2021-08-31

## 2021-08-31 MED ORDER — PENTAFLUOROPROP-TETRAFLUOROETH EX AERO: 1.0000 "application " | INHALATION_SPRAY | CUTANEOUS | Status: DC | PRN

## 2021-08-31 MED ORDER — DARBEPOETIN ALFA 60 MCG/0.3ML IJ SOSY
60.0000 ug | PREFILLED_SYRINGE | INTRAMUSCULAR | Status: DC
  Administered 2021-08-31: 60 ug via INTRAVENOUS
  Filled 2021-08-31: qty 0.3

## 2021-08-31 MED ORDER — HYDROCODONE BIT-HOMATROP MBR 5-1.5 MG/5ML PO SOLN: 5.0000 mL | Freq: Four times a day (QID) | ORAL | Status: DC | PRN

## 2021-08-31 MED ORDER — MIDODRINE HCL 5 MG PO TABS
5.0000 mg | ORAL_TABLET | Freq: Two times a day (BID) | ORAL | Status: DC
  Administered 2021-08-31 – 2021-09-02 (×4): 5 mg via ORAL
  Filled 2021-08-31 (×6): qty 1

## 2021-08-31 MED ORDER — LORATADINE 10 MG PO TABS
10.0000 mg | ORAL_TABLET | Freq: Every day | ORAL | Status: DC
  Administered 2021-09-01: 10 mg via ORAL
  Filled 2021-08-31 (×2): qty 1

## 2021-08-31 MED ORDER — HEPARIN SODIUM (PORCINE) 5000 UNIT/ML IJ SOLN
5000.0000 [IU] | Freq: Three times a day (TID) | INTRAMUSCULAR | Status: DC
  Administered 2021-08-31 – 2021-09-01 (×5): 5000 [IU] via SUBCUTANEOUS
  Filled 2021-08-31 (×6): qty 1

## 2021-08-31 MED ORDER — LANTHANUM CARBONATE 500 MG PO CHEW
1000.0000 mg | CHEWABLE_TABLET | Freq: Three times a day (TID) | ORAL | Status: DC
  Administered 2021-09-01 – 2021-09-02 (×4): 1000 mg via ORAL
  Filled 2021-08-31 (×4): qty 2

## 2021-08-31 MED ORDER — ONDANSETRON HCL 4 MG PO TABS: 4.0000 mg | ORAL_TABLET | Freq: Four times a day (QID) | ORAL | Status: DC | PRN

## 2021-08-31 MED ORDER — ACETAMINOPHEN 325 MG PO TABS
650.0000 mg | ORAL_TABLET | ORAL | Status: DC | PRN
  Administered 2021-08-31: 650 mg via ORAL
  Filled 2021-08-31: qty 2

## 2021-08-31 MED ORDER — ACETAMINOPHEN 325 MG PO TABS: 650.0000 mg | ORAL_TABLET | Freq: Four times a day (QID) | ORAL | Status: DC | PRN

## 2021-08-31 MED ORDER — OXYCODONE-ACETAMINOPHEN 5-325 MG PO TABS
1.0000 | ORAL_TABLET | Freq: Four times a day (QID) | ORAL | Status: DC | PRN
  Administered 2021-08-31: 1 via ORAL
  Filled 2021-08-31: qty 1

## 2021-08-31 MED ORDER — ONDANSETRON HCL 4 MG/2ML IJ SOLN: 4.0000 mg | Freq: Four times a day (QID) | INTRAMUSCULAR | Status: DC | PRN

## 2021-08-31 MED ORDER — FLUTICASONE PROPIONATE 50 MCG/ACT NA SUSP
2.0000 | Freq: Every day | NASAL | Status: DC
  Filled 2021-08-31: qty 16

## 2021-08-31 MED ORDER — IPRATROPIUM-ALBUTEROL 0.5-2.5 (3) MG/3ML IN SOLN
3.0000 mL | Freq: Three times a day (TID) | RESPIRATORY_TRACT | Status: DC
  Administered 2021-08-31 – 2021-09-02 (×6): 3 mL via RESPIRATORY_TRACT
  Filled 2021-08-31 (×8): qty 3

## 2021-08-31 MED ORDER — SODIUM CHLORIDE 0.9 % IV SOLN
2.0000 g | INTRAVENOUS | Status: DC
  Administered 2021-09-01 – 2021-09-02 (×2): 2 g via INTRAVENOUS
  Filled 2021-08-31 (×2): qty 20

## 2021-08-31 MED ORDER — ATORVASTATIN CALCIUM 10 MG PO TABS
20.0000 mg | ORAL_TABLET | Freq: Every day | ORAL | Status: DC
  Administered 2021-08-31 – 2021-09-02 (×3): 20 mg via ORAL
  Filled 2021-08-31 (×3): qty 2

## 2021-08-31 MED ORDER — LIDOCAINE HCL (PF) 1 % IJ SOLN: 5.0000 mL | INTRAMUSCULAR | Status: DC | PRN

## 2021-08-31 MED ORDER — PANTOPRAZOLE SODIUM 40 MG PO TBEC
40.0000 mg | DELAYED_RELEASE_TABLET | Freq: Every day | ORAL | Status: DC
  Administered 2021-09-01 – 2021-09-02 (×2): 40 mg via ORAL
  Filled 2021-08-31 (×2): qty 1

## 2021-08-31 MED ORDER — SODIUM CHLORIDE 0.9 % IV SOLN: 100.0000 mL | INTRAVENOUS | Status: DC | PRN

## 2021-08-31 MED ORDER — SODIUM CHLORIDE 0.9 % IV SOLN
500.0000 mg | Freq: Once | INTRAVENOUS | Status: AC
  Administered 2021-08-31: 500 mg via INTRAVENOUS
  Filled 2021-08-31: qty 5

## 2021-08-31 MED ORDER — LIDOCAINE-PRILOCAINE 2.5-2.5 % EX CREA: 1.0000 "application " | TOPICAL_CREAM | CUTANEOUS | Status: DC | PRN

## 2021-08-31 MED ORDER — POLYETHYLENE GLYCOL 3350 17 G PO PACK: 17.0000 g | PACK | Freq: Every day | ORAL | Status: DC | PRN

## 2021-08-31 MED ORDER — GUAIFENESIN ER 600 MG PO TB12
1200.0000 mg | ORAL_TABLET | Freq: Two times a day (BID) | ORAL | Status: DC
  Administered 2021-08-31 – 2021-09-02 (×5): 1200 mg via ORAL
  Filled 2021-08-31 (×5): qty 2

## 2021-08-31 MED ORDER — INSULIN GLARGINE-YFGN 100 UNIT/ML ~~LOC~~ SOLN
18.0000 [IU] | Freq: Every day | SUBCUTANEOUS | Status: DC
  Administered 2021-08-31 – 2021-09-02 (×3): 18 [IU] via SUBCUTANEOUS
  Filled 2021-08-31 (×3): qty 0.18

## 2021-08-31 MED ORDER — HEPARIN SODIUM (PORCINE) 1000 UNIT/ML DIALYSIS: 1000.0000 [IU] | INTRAMUSCULAR | Status: DC | PRN

## 2021-08-31 MED ORDER — HYDRALAZINE HCL 20 MG/ML IJ SOLN: 10.0000 mg | Freq: Four times a day (QID) | INTRAMUSCULAR | Status: DC | PRN

## 2021-08-31 MED ORDER — VITAMIN D 25 MCG (1000 UNIT) PO TABS
1000.0000 [IU] | ORAL_TABLET | Freq: Every day | ORAL | Status: DC
  Administered 2021-09-01 – 2021-09-02 (×2): 1000 [IU] via ORAL
  Filled 2021-08-31 (×2): qty 1

## 2021-08-31 MED ORDER — FUROSEMIDE 40 MG PO TABS
80.0000 mg | ORAL_TABLET | Freq: Every day | ORAL | Status: DC
  Administered 2021-08-31 – 2021-09-02 (×3): 80 mg via ORAL
  Filled 2021-08-31 (×3): qty 2

## 2021-08-31 MED ORDER — CHLORHEXIDINE GLUCONATE CLOTH 2 % EX PADS
6.0000 | MEDICATED_PAD | Freq: Every day | CUTANEOUS | Status: DC
  Administered 2021-09-01 – 2021-09-02 (×2): 6 via TOPICAL

## 2021-08-31 MED ORDER — SODIUM CHLORIDE 0.9 % IV SOLN
1.0000 g | Freq: Once | INTRAVENOUS | Status: AC
  Administered 2021-08-31: 1 g via INTRAVENOUS
  Filled 2021-08-31: qty 10

## 2021-08-31 NOTE — Assessment & Plan Note (Addendum)
Continue atorvastatin

## 2021-08-31 NOTE — Progress Notes (Signed)
I have seen and assessed patient and agree with Dr. Darrick Grinder assessment and plan.  Patient is a 71 year old gentleman history of end-stage renal disease on HD Tuesday Thursday Saturday, type 2 diabetes, hypertension, chronic diastolic CHF, hyperlipidemia, PAD status post left BKA, GERD, MGUS, anemia of chronic disease presented to the ED with complaints of cough, back pain, headache.  Patient noted to have not been to dialysis sessions since 08/20/2021 due to not feeling well.  Patient also with complaints of worsening shortness of breath on exertion improved with rest with some associated back pain.  Patient seen in the ED chest x-ray concerning for left lower lobe pneumonia with a concurrent left pleural effusion.  Patient noted to have a leukocytosis.  Comprehensive metabolic profile obtained with a bicarb of 20, glucose 134, BUN 101, creatinine 17, calcium of 8.8, albumin of 3.2, potassium of 3.9 otherwise was within normal limits.  Patient placed empirically on IV antibiotics.  Patient awaiting transfer to [facility] as he is dialysis dependent.  Check urine Legionella antigen, check urine pneumococcus antigen, check a UA with cultures and sensitivities.  Continue empiric IV antibiotics.  Place patient on Mucinex 1200 mg twice daily, Flonase, PPI, scheduled duo nebs.  Nephrology consulted for patient's ESRD on HD.  No charge.

## 2021-08-31 NOTE — ED Notes (Signed)
Report given to Destiny RN at hemodialysis center at [facility]. Destiny reports that she will call care link to transfer.
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
## 2021-08-31 NOTE — ED Notes (Signed)
Pt assisted to restroom via wheelchair
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
## 2021-08-31 NOTE — ED Notes (Signed)
Lunch provided.

## 2021-08-31 NOTE — Assessment & Plan Note (Addendum)
--   With productive cough and shortness of breath, not back to baseline yet.  Continue ceftriaxone and azithromycin.  No documented hypoxia.  COVID and flu negative.  Follow-up culture data.

## 2021-08-31 NOTE — ED Notes (Signed)
CBG: 115 

## 2021-08-31 NOTE — Assessment & Plan Note (Addendum)
--   Stable, continue long-acting insulin, meal coverage.  Hemoglobin A1c 8.5.

## 2021-08-31 NOTE — Assessment & Plan Note (Addendum)
--   No evidence of volume overload.  Continue Lasix (patient still makes urine).  HD per nephrology.

## 2021-08-31 NOTE — Assessment & Plan Note (Addendum)
--   Missed HD approximately 10 days.  Underwent HD yesterday 1/2.  Further management per nephrology.  Potassium stable.
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
## 2021-08-31 NOTE — Assessment & Plan Note (Addendum)
--   Per chart, however patient not on any antihypertensives.  In fact he is on midodrine.

## 2021-08-31 NOTE — Assessment & Plan Note (Addendum)
--  Hemoglobin near baseline.  Management per nephrology.

## 2021-09-01 DIAGNOSIS — M898X9 Other specified disorders of bone, unspecified site: Secondary | ICD-10-CM

## 2021-09-01 LAB — HEPATITIS B SURFACE ANTIBODY,QUALITATIVE
Hep B S Ab: NONREACTIVE
Hep B S Ab: NONREACTIVE

## 2021-09-01 LAB — URINE CULTURE: Culture: <10000 — AB

## 2021-09-01 LAB — CBC WITH DIFFERENTIAL/PLATELET
Abs Immature Granulocytes: 0.05 10*3/uL (ref 0.00–0.07)
Basophils Absolute: 0.1 10*3/uL (ref 0.0–0.1)
Basophils Relative: 1 %
Eosinophils Absolute: 0.2 10*3/uL (ref 0.0–0.5)
Eosinophils Relative: 2 %
HCT: 27.8 % — ABNORMAL LOW (ref 39.0–52.0)
Hemoglobin: 8.9 g/dL — ABNORMAL LOW (ref 13.0–17.0)
Immature Granulocytes: 1 %
Lymphocytes Relative: 26 %
Lymphs Abs: 2.1 10*3/uL (ref 0.7–4.0)
MCH: 30 pg (ref 26.0–34.0)
MCHC: 32 g/dL (ref 30.0–36.0)
MCV: 93.6 fL (ref 80.0–100.0)
Monocytes Absolute: 0.7 10*3/uL (ref 0.1–1.0)
Monocytes Relative: 9 %
Neutro Abs: 4.9 10*3/uL (ref 1.7–7.7)
Neutrophils Relative %: 61 %
Platelets: 392 10*3/uL (ref 150–400)
RBC: 2.97 MIL/uL — ABNORMAL LOW (ref 4.22–5.81)
RDW: 14.8 % (ref 11.5–15.5)
WBC: 8 10*3/uL (ref 4.0–10.5)
nRBC: 0 % (ref 0.0–0.2)

## 2021-09-01 LAB — COMPREHENSIVE METABOLIC PANEL
ALT: 11 U/L (ref 0–44)
AST: 11 U/L — ABNORMAL LOW (ref 15–41)
Albumin: 2.6 g/dL — ABNORMAL LOW (ref 3.5–5.0)
Alkaline Phosphatase: 75 U/L (ref 38–126)
Anion gap: 15 (ref 5–15)
BUN: 47 mg/dL — ABNORMAL HIGH (ref 8–23)
CO2: 25 mmol/L (ref 22–32)
Calcium: 9 mg/dL (ref 8.9–10.3)
Chloride: 97 mmol/L — ABNORMAL LOW (ref 98–111)
Creatinine, Ser: 10.57 mg/dL — ABNORMAL HIGH (ref 0.61–1.24)
GFR, Estimated: 5 mL/min — ABNORMAL LOW (ref >60)
Glucose, Bld: 168 mg/dL — ABNORMAL HIGH (ref 70–99)
Potassium: 3.5 mmol/L (ref 3.5–5.1)
Sodium: 137 mmol/L (ref 135–145)
Total Bilirubin: 1 mg/dL (ref 0.3–1.2)
Total Protein: 7 g/dL (ref 6.5–8.1)

## 2021-09-01 LAB — HEPATITIS B SURFACE ANTIGEN
Hepatitis B Surface Ag: NONREACTIVE
Hepatitis B Surface Ag: NONREACTIVE

## 2021-09-01 LAB — LEGIONELLA PNEUMOPHILA SEROGP 1 UR AG: L. pneumophila Serogp 1 Ur Ag: NEGATIVE

## 2021-09-01 LAB — GLUCOSE, CAPILLARY
Glucose-Capillary: 123 mg/dL — ABNORMAL HIGH (ref 70–99)
Glucose-Capillary: 232 mg/dL — ABNORMAL HIGH (ref 70–99)
Glucose-Capillary: 125 mg/dL — ABNORMAL HIGH (ref 70–99)
Glucose-Capillary: 130 mg/dL — ABNORMAL HIGH (ref 70–99)

## 2021-09-01 LAB — MAGNESIUM: Magnesium: 1.8 mg/dL (ref 1.7–2.4)

## 2021-09-01 NOTE — Hospital Course (Addendum)
71 year old man PMH ESRD presented with productive cough, no dialysis since December 22 because "did not feel like it".  Admitted for pneumonia and HD. --1/3 better today, but breathing not back to baseline yet.  Productive cough.  Continue antibiotics,  if continues to improve, likely home tomorrow.

## 2021-09-01 NOTE — Progress Notes (Signed)
°  Transition of Care [facility]) Screening Note   Patient Details  Name: Mark [name] Date of Birth: [birth year]   Transition of Care [facility] CM/SW Contact:    Benard Halsted, LCSW Phone Number: 09/01/2021, 8:31 AM    Transition of Care Department [facility]) has reviewed patient and no TOC needs have been identified at this time. We will continue to monitor patient advancement through interdisciplinary progression rounds. If new patient transition needs arise, please place a TOC consult.

## 2021-09-01 NOTE — Progress Notes (Signed)
[facility] Progress Note  Name: Mark [name] MRN: [id number] DOB: [birth year]  Chief Complaint:  Shortness of breath  Subjective:  last HD on 1/2 with 3 kg UF.  He feels ok today.  He states that he might be going home tomorrow per his team.   Review of systems:  Reports shortness of breath better but breathing not at baseline  Denies n/v No chest pain  ---------------- Background on consult:  Mark [name] is a 71 y.o. male with a history of ESRD, hypertension, type 2 diabetes mellitus, PAD, seizure disorder and prior cocaine abuse who presented to the hospital with a cough and shortness of breath.  He also reported chills fatigue.  He presented to [facility] and was transferred to [facility] given the need for dialysis.  Nephrology is consulted for assistance with management of hemodialysis.  He has missed several treatments stating that he did not feel up to attending his scheduled hemodialysis.  He had HD on 12/27 and 12/20 and 12/15 recently from what I am able to see.  Last post weight on 12/27 was 111.5 kg. He clarifies that his cocaine use is not recent.  Seen on HD and procedure supervised.  144/83 and HR 91 with Left arm AVF in use.  Tolerating goal.  He states at first that he is on midodrine to lower his blood pressure then states that he takes before dialysis then states BID.  Unsure what he's actually taking.  He states that he is on coreg and has a history of arrhythmia.   Intake/Output Summary (Last 24 hours) at 09/01/2021 1109 Last data filed at 09/01/2021 1045 Gross per 24 hour  Intake 690 ml  Output 3150 ml  Net -2460 ml    Vitals:  Vitals:   09/01/21 0035 09/01/21 0400 09/01/21 0745 09/01/21 0905  BP: 104/60 108/69 118/70   Pulse: 98 96 88   Resp: 19 20 19    Temp: 98.6 F (37 C) 98.5 F (36.9 C) 98.1 F (36.7 C)   TempSrc: Oral Oral Oral   SpO2: 93% 94% 94% 95%  Weight:      Height:         Physical Exam:  General: adult male in bed in NAD  at rest HEENT: NCAT Eyes: EOMI sclera anicteric Neck: supple trachea midline Heart: S1S2 no rub Lungs: clear but reduced on auscultation; normal work of breathing at rest on room air Abdomen: softly distended/obese/nontender Extremities: left BKA and trace edema right LE Skin: no rash on extremities exposed  Neuro: alert and oriented x 3 provides hx and follows commands  Psych normal mood and affect Access LUE AVF bruit and thrill   Medications reviewed   Labs:  BMP Latest Ref Rng & Units 09/01/2021 08/31/2021 08/30/2021  Glucose 70 - 99 mg/dL 168(H) 96 134(H)  BUN 8 - 23 mg/dL 47(H) 111(H) 101(H)  Creatinine 0.61 - 1.24 mg/dL 10.57(H) 18.39(H) 17.00(H)  BUN/Creat Ratio 10 - 24 - - -  Sodium 135 - 145 mmol/L 137 142 140  Potassium 3.5 - 5.1 mmol/L 3.5 3.8 3.9  Chloride 98 - 111 mmol/L 97(L) 108 107  CO2 22 - 32 mmol/L 25 21(L) 20(L)  Calcium 8.9 - 10.3 mg/dL 9.0 8.7(L) 8.8(L)   Outpatient HD:  [facility] TTS  4 hours  BF 400 / DF 800  2K / 2 Ca bath  107 kg EDW AVF Meds: sensipar 30 mg three times a week  Mircera 60 mcg every  2 weeks - last given on 08/06/21   Calcitriol 0.75 mcg three times a week   Assessment/Plan:   # ESRD - we will attempt to resume his TTS schedule for a treatment on 1/3 as staffing permits (multiple recent missed treatments) however anticipate next tx here will be pushed to 1/4 - I let him know   # PNA - Per primary team - he has been given ceftriaxone and azithro     # Medical noncompliance - Encouraged compliance with HD   # HTN - charted as hx of such but now appears to be on midodrine. Note also charted as on coreg - beta blocker per primary team discretion.  He has stated that he is on coreg and has a history of arrhythmia.    # Anemia CKD - due for ESA - aranesp at 60 mcg weekly on mondays for now   # Metabolic bone disease  - on sensipar and calcitriol outpatient - resumed here.  Note both are given with HD and should not be  included on the discharge summary. resumed home fosrenol.    Disposition per primary team   Claudia Desanctis, MD 09/01/2021 11:25 AM

## 2021-09-01 NOTE — Progress Notes (Signed)
°  Progress Note Patient: Mark [name] DSK:[id number] DOB: 07/13/1951 DOA: 08/30/2021     1 DOS: the patient was seen and examined on 09/01/2021   Brief hospital course: 71 year old man PMH ESRD presented with productive cough, no dialysis since December 22 because "did not feel like it".  Admitted for pneumonia and HD. --1/3 better today, but breathing not back to baseline yet.  Productive cough.  Continue antibiotics,  if continues to improve, likely home tomorrow.   Assessment and Plan * Pneumonia of left lower lobe due to infectious organism- (present on admission) -- With productive cough and shortness of breath, not back to baseline yet.  Continue ceftriaxone and azithromycin.  No documented hypoxia.  COVID and flu negative.  Follow-up culture data.   Mixed diabetic hyperlipidemia associated with type 2 diabetes mellitus ([place])- (present on admission) -- Continue atorvastatin   ESRD on hemodialysis ([place]) -- Missed HD approximately 10 days.  Underwent HD yesterday 1/2.  Further management per nephrology.  Potassium stable.   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-stage renal disease) ([facility])- (present on admission) -- Stable, continue long-acting insulin, meal coverage.  Hemoglobin A1c 8.5.   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 Note both are given with HD and should not be included on the discharge summary.    Acute back pain- (present on admission) -- Not complained of today.  Monitor clinically.  Likely musculoskeletal strain.  Chronic diastolic CHF (congestive heart failure) ([place])- (present on admission) -- No evidence of volume overload.  Continue Lasix (patient still makes urine).  HD per nephrology.  Essential hypertension- (present on admission) -- Per chart, however patient not on any antihypertensives.  In fact he is on midodrine.    Anemia of chronic disease- (present on admission) --Hemoglobin near  baseline.  Management per nephrology.     Subjective:  Feels better but not back to baseline, still short of breath.  Productive cough.  Objective Vital signs were reviewed and unremarkable. Physical Exam Constitutional:      General: He is not in acute distress.    Appearance: He is not ill-appearing or toxic-appearing.  Cardiovascular:     Rate and Rhythm: Normal rate and regular rhythm.     Heart sounds: No murmur heard. Pulmonary:     Effort: Pulmonary effort is normal. No respiratory distress.     Breath sounds: Rales (posterior crackles bilaterally) present. No wheezing or rhonchi.  Musculoskeletal:     Right lower leg: No edema.  Neurological:     Mental Status: He is alert.  Psychiatric:        Mood and Affect: Mood normal.        Behavior: Behavior normal.   Data Reviewed: CBG stable, K+ 3.5, creatinine down to 10.57 s/p HD; Hgb 8.9  Family Communication: none  Disposition: Status is: Inpatient  Remains inpatient appropriate because: treatment for pneumonia     Time spent: 35 minutes  Author: Murray Hodgkins 09/01/2021 11:48 AM  For on call review [email or web address].

## 2021-09-01 NOTE — Assessment & Plan Note (Signed)
--  secondary to ESRD --per nephrology: on sensipar and calcitriol outpatient. Note both are given with HD and should not be included on the discharge summary.

## 2021-09-02 ENCOUNTER — Other Ambulatory Visit (HOSPITAL_COMMUNITY): Payer: Self-pay

## 2021-09-02 ENCOUNTER — Encounter (HOSPITAL_COMMUNITY): Payer: Self-pay | Admitting: *Deleted

## 2021-09-02 LAB — GLUCOSE, CAPILLARY
Glucose-Capillary: 112 mg/dL — ABNORMAL HIGH (ref 70–99)
Glucose-Capillary: 80 mg/dL (ref 70–99)

## 2021-09-02 LAB — HEPATITIS B SURFACE ANTIBODY, QUANTITATIVE
Hep B S AB Quant (Post): 17.2 m[IU]/mL (ref >9.9)
Hep B S AB Quant (Post): 15.4 m[IU]/mL (ref >9.9)

## 2021-09-02 MED ORDER — CARVEDILOL 3.125 MG PO TABS
3.1250 mg | ORAL_TABLET | Freq: Two times a day (BID) | ORAL | 11 refills | Status: DC
  Filled 2021-09-02: qty 60, 30d supply, fill #0

## 2021-09-02 MED ORDER — CEPHALEXIN 500 MG PO CAPS
500.0000 mg | ORAL_CAPSULE | Freq: Every day | ORAL | 0 refills | Status: AC
  Filled 2021-09-02: qty 2, 2d supply, fill #0

## 2021-09-02 NOTE — Discharge Summary (Signed)
PATIENT DETAILS Name: Mark [name] Age: 71 y.o. Sex: male Date of Birth: [birth year] MRN: [id number]. Admitting Physician: Vernelle Emerald, MD BBC:[id number], Dalbert Batman, MD  Admit Date: 08/30/2021 Discharge date: 09/02/2021  Recommendations for Outpatient Follow-up:  Follow up with PCP in 1-2 weeks Please obtain CMP/CBC in one week Continue counseling regarding importance of timely dialysis, medication compliance.  Admitted From:  Home  Disposition: [place]: No  Equipment/Devices: None  Discharge Condition: Stable  CODE STATUS: FULL CODE  Diet recommendation:  Diet Order             Diet - low sodium heart healthy           Diet Carb Modified           Diet renal with fluid restriction Fluid restriction: 1200 mL Fluid; Room service appropriate? Yes; Fluid consistency: Thin  Diet effective now                    Brief Summary: See H&P, Labs, Consult and Test reports for all details in brief, patient is a 71 year old male with history of ESRD, DM-2, HTN-who missed several of his outpatient dialysis-presented with cough, shortness of breath-found to have PNA.  See below for further details.  Brief Hospital Course: Community-acquired PNA: Afebrile-overall improved-cultures negative-transition to Keflex on discharge.  ESRD with missed hemodialysis x10 days: Followed by nephrology closely during this hospital stay-underwent inpatient HD-discussed with Dr. Mosetta Putt to discharge today-patient will resume usual outpatient schedule starting tomorrow.  Normocytic anemia: Due to ESRD-Aranesp/iron deferred to nephrology.  Metabolic bone disease: Continue Sensipar/calcitriol as outpatient per nephrology.  Chronic diastolic heart failure: Euvolemic on exam-still makes urine-continue furosemide on discharge.  HLD: Continue statin  DM-2: CBG stable-continue Lantus  Orthostatic hypotension: Per patient-he is on midodrine on dialysis days.  HTN: Per  patient-he still is on Coreg-BP in the low 100s this morning-discussed with nephrology-we will continue Coreg but changed to 3.125 mg on discharge.  Patient unclear exactly what antihypertensive medication he is on at home.  Further optimization can be done in the outpatient setting.  Obesity: Estimated body mass index is 32.58 kg/m as calculated from the following:   Height as of this encounter: 6' 2"  (1.88 m).   Weight as of this encounter: 115.1 kg.   Procedures None  Discharge Diagnoses:  Principal Problem:   Pneumonia of left lower lobe due to infectious organism Active Problems:   Anemia of chronic disease   Essential hypertension   Chronic diastolic CHF (congestive heart failure) ([facility])   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-stage renal disease) ([place])   ESRD on hemodialysis ([place])   Acute back pain   Mixed diabetic hyperlipidemia associated with type 2 diabetes mellitus ([place])   Metabolic bone disease   Discharge Instructions:  Activity:  As tolerated   Discharge Instructions     Call MD for:  difficulty breathing, headache or visual disturbances   Complete by: As directed    Diet - low sodium heart healthy   Complete by: As directed    Diet Carb Modified   Complete by: As directed    Discharge instructions   Complete by: As directed    Follow with Primary MD  [name], MD in 1-2 weeks  Please get a complete blood count and chemistry panel checked by your Primary MD at your next visit, and again as instructed by your Primary MD.  Get Medicines reviewed and adjusted: Please  take all your medications with you for your next visit with your Primary MD  Laboratory/radiological data: Please request your Primary MD to go over all hospital tests and procedure/radiological results at the follow up, please ask your Primary MD to get all Hospital records sent to his/her office.  In some cases, they will be blood work, cultures and biopsy results pending at the  time of your discharge. Please request that your primary care M.D. follows up on these results.  Also Note the following: If you experience worsening of your admission symptoms, develop shortness of breath, life threatening emergency, suicidal or homicidal thoughts you must seek medical attention immediately by calling 911 or calling your MD immediately  if symptoms less severe.  You must read complete instructions/literature along with all the possible adverse reactions/side effects for all the Medicines you take and that have been prescribed to you. Take any new Medicines after you have completely understood and accpet all the possible adverse reactions/side effects.   Do not drive when taking Pain medications or sleeping medications (Benzodaizepines)  Do not take more than prescribed Pain, Sleep and Anxiety Medications. It is not advisable to combine anxiety,sleep and pain medications without talking with your primary care practitioner  Special Instructions: If you have smoked or chewed Tobacco  in the last 2 yrs please stop smoking, stop any regular Alcohol  and or any Recreational drug use.  Wear Seat belts while driving.  Please note: You were cared for by a hospitalist during your hospital stay. Once you are discharged, your primary care physician will handle any further medical issues. Please note that NO REFILLS for any discharge medications will be authorized once you are discharged, as it is imperative that you return to your primary care physician (or establish a relationship with a primary care physician if you do not have one) for your post hospital discharge needs so that they can reassess your need for medications and monitor your lab values.   1.  Please keep your scheduled dialysis appointment.  Missing dialysis sometimes can be life-threatening and life disabling.   Increase activity slowly   Complete by: As directed       Allergies as of 09/02/2021   No Known Allergies       Medication List     STOP taking these medications    diclofenac Sodium 1 % Gel Commonly known as: Voltaren   predniSONE 20 MG tablet Commonly known as: DELTASONE       TAKE these medications    acetaminophen 500 MG tablet Commonly known as: TYLENOL Take 500 mg by mouth every 6 (six) hours as needed for moderate pain or headache.   atorvastatin 20 MG tablet Commonly known as: LIPITOR Take 1 tablet (20 mg total) by mouth daily.   carvedilol 3.125 MG tablet Commonly known as: Coreg Take 1 tablet (3.125 mg total) by mouth 2 (two) times daily. What changed:  medication strength how much to take when to take this   cephALEXin 500 MG capsule Commonly known as: Keflex Take 1 capsule (500 mg total) by mouth daily for 2 days. Take after dialysis on dialysis days.   cholecalciferol 25 MCG (1000 UNIT) tablet Commonly known as: VITAMIN D3 Take 1,000 Units by mouth daily.   furosemide 80 MG tablet Commonly known as: LASIX Take 80 mg by mouth daily.   INSULIN SYRINGE .5CC/30GX5/16" 30G X 5/16" 0.5 ML Misc Check blood sugar TID & QHS   lanthanum 1000 MG chewable tablet Commonly  known as: FOSRENOL Chew 1,000 mg by mouth as needed. Take 1 tablet with each meal & snack   Lantus SoloStar 100 UNIT/ML Solostar Pen Generic drug: insulin glargine Inject 19 Units into the skin at bedtime. What changed:  how much to take when to take this   midodrine 5 MG tablet Commonly known as: PROAMATINE Take 5 mg by mouth in the morning and at bedtime. Do not takes in bp is lower 130/80   multivitamin with minerals Tabs tablet Take 1 tablet by mouth daily.   OneTouch Delica Lancets 80K Misc Use as directed to test blood sugar three times daily.   OneTouch Verio test strip Generic drug: glucose blood USE AS DIRECTED THREE TIMES DAILY TO  TEST  BLOOD  SUGAR   OneTouch Verio w/Device Kit Use as directed to test blood sugar three times daily.   TRUEplus 5-Bevel Pen Needles 32G  X 4 MM Misc Generic drug: Insulin Pen Needle Use to administer Lantus once daily.        Follow-up Information     [name], MD. Schedule an appointment as soon as possible for a visit in 1 week(s).   Specialty: Internal Medicine Contact information: [place] [address] [phone]                No Known Allergies    Consultations:  nephrology  Other Procedures/Studies: DG Chest 2 View  Result Date: 08/30/2021 CLINICAL DATA:  Shortness of breath and cough. EXAM: CHEST - 2 VIEW COMPARISON:  Dec 31, 2016 FINDINGS: The heart size and mediastinal contours are within normal limits. Mild patchy opacity of left lung base is noted. There is a small left pleural effusion. The right lung is clear. The visualized skeletal structures are unremarkable. IMPRESSION: Mild patchy opacity of left lung base, suspicious for pneumonia with small left pleural effusion. Electronically Signed   By: Abelardo Diesel M.D.   On: 08/30/2021 14:29     TODAY-DAY OF DISCHARGE:  Subjective:   Cephus Slater today has no headache,no chest abdominal pain,no new weakness tingling or numbness, feels much better wants to go home today.   Objective:   Blood pressure 105/65, pulse 93, temperature 97.9 F (36.6 C), temperature source Oral, resp. rate 18, height 6' 2"  (1.88 m), weight 115.1 kg, SpO2 97 %.  Intake/Output Summary (Last 24 hours) at 09/02/2021 1241 Last data filed at 09/02/2021 1149 Gross per 24 hour  Intake 120 ml  Output 2813 ml  Net -2693 ml   Filed Weights   08/30/21 1353 08/31/21 1408 09/02/21 0756  Weight: 108.9 kg 114.4 kg 115.1 kg    Exam: Awake Alert, Oriented *3, No new F.N deficits, Normal affect [place].AT,PERRAL Supple Neck,No JVD, No cervical lymphadenopathy appriciated.  Symmetrical Chest wall movement, Good air movement bilaterally, CTAB RRR,No Gallops,Rubs or new Murmurs, No Parasternal Heave +ve B.Sounds, Abd Soft, Non tender, No organomegaly  appriciated, No rebound -guarding or rigidity. No Cyanosis, Clubbing or edema, No new Rash or bruise   PERTINENT RADIOLOGIC STUDIES: No results found.   PERTINENT LAB RESULTS: CBC: Recent Labs    08/31/21 1050 09/01/21 0130  WBC 14.3* 8.0  HGB 9.7* 8.9*  HCT 29.3* 27.8*  PLT 439* 392   CMET CMP     Component Value Date/Time   NA 137 09/01/2021 0130   NA 145 (H) 10/18/2017 1424   K 3.5 09/01/2021 0130   CL 97 (L) 09/01/2021 0130   CO2 25 09/01/2021 0130  GLUCOSE 168 (H) 09/01/2021 0130   BUN 47 (H) 09/01/2021 0130   BUN 54 (H) 10/18/2017 1424   CREATININE 10.57 (H) 09/01/2021 0130   CREATININE 11.14 (HH) 07/13/2021 0830   CREATININE 2.16 (H) 11/16/2016 1148   CALCIUM 9.0 09/01/2021 0130   PROT 7.0 09/01/2021 0130   PROT 7.9 02/03/2021 0922   ALBUMIN 2.6 (L) 09/01/2021 0130   ALBUMIN 3.9 02/03/2021 0922   AST 11 (L) 09/01/2021 0130   AST 11 (L) 07/13/2021 0830   ALT 11 09/01/2021 0130   ALT 9 07/13/2021 0830   ALKPHOS 75 09/01/2021 0130   BILITOT 1.0 09/01/2021 0130   BILITOT 0.3 07/13/2021 0830   GFRNONAA 5 (L) 09/01/2021 0130   GFRNONAA 5 (L) 07/13/2021 0830   GFRNONAA 31 (L) 11/16/2016 1148   GFRAA 8 (L) 05/30/2020 1111   GFRAA 7 (L) 02/18/2020 1357   GFRAA 36 (L) 11/16/2016 1148    GFR Estimated Creatinine Clearance: 8.9 mL/min (A) (by C-G formula based on SCr of 10.57 mg/dL (H)). No results for input(s): LIPASE, AMYLASE in the last 72 hours. No results for input(s): CKTOTAL, CKMB, CKMBINDEX, TROPONINI in the last 72 hours. Invalid input(s): POCBNP No results for input(s): DDIMER in the last 72 hours. Recent Labs    08/31/21 0537  HGBA1C 8.5*   No results for input(s): CHOL, HDL, LDLCALC, TRIG, CHOLHDL, LDLDIRECT in the last 72 hours. No results for input(s): TSH, T4TOTAL, T3FREE, THYROIDAB in the last 72 hours.  Invalid input(s): FREET3 No results for input(s): VITAMINB12, FOLATE, FERRITIN, TIBC, IRON, RETICCTPCT in the last 72  hours. Coags: No results for input(s): INR in the last 72 hours.  Invalid input(s): PT Microbiology: Recent Results (from the past 240 hour(s))  Resp Panel by RT-PCR (Flu A&B, Covid) Nasopharyngeal Swab     Status: None   Collection Time: 08/30/21  3:13 PM   Specimen: Nasopharyngeal Swab; Nasopharyngeal(NP) swabs in vial transport medium  Result Value Ref Range Status   SARS Coronavirus 2 by RT PCR NEGATIVE NEGATIVE Final    Comment: (NOTE) SARS-CoV-2 target nucleic acids are NOT DETECTED.  The SARS-CoV-2 RNA is generally detectable in upper respiratory specimens during the acute phase of infection. The lowest concentration of SARS-CoV-2 viral copies this assay can detect is 138 copies/mL. A negative result does not preclude SARS-Cov-2 infection and should not be used as the sole basis for treatment or other patient management decisions. A negative result may occur with  improper specimen collection/handling, submission of specimen other than nasopharyngeal swab, presence of viral mutation(s) within the areas targeted by this assay, and inadequate number of viral copies(<138 copies/mL). A negative result must be combined with clinical observations, patient history, and epidemiological information. The expected result is Negative.  Fact Sheet for Patients:  EntrepreneurPulse.com.au  Fact Sheet for Healthcare Providers:  IncredibleEmployment.be  This test is no t yet approved or cleared by the [place] FDA and  has been authorized for detection and/or diagnosis of SARS-CoV-2 by FDA under an Emergency Use Authorization (EUA). This EUA will remain  in effect (meaning this test can be used) for the duration of the COVID-19 declaration under Section 564(b)(1) of the Act, 21 U.S.C.section 360bbb-3(b)(1), unless the authorization is terminated  or revoked sooner.       Influenza A by PCR NEGATIVE NEGATIVE Final   Influenza B by PCR NEGATIVE  NEGATIVE Final    Comment: (NOTE) The Xpert Xpress SARS-CoV-2/FLU/RSV plus assay is intended as an aid in the diagnosis of  influenza from Nasopharyngeal swab specimens and should not be used as a sole basis for treatment. Nasal washings and aspirates are unacceptable for Xpert Xpress SARS-CoV-2/FLU/RSV testing.  Fact Sheet for Patients: EntrepreneurPulse.com.au  Fact Sheet for Healthcare Providers: IncredibleEmployment.be  This test is not yet approved or cleared by the [place] FDA and has been authorized for detection and/or diagnosis of SARS-CoV-2 by FDA under an Emergency Use Authorization (EUA). This EUA will remain in effect (meaning this test can be used) for the duration of the COVID-19 declaration under Section 564(b)(1) of the Act, 21 U.S.C. section 360bbb-3(b)(1), unless the authorization is terminated or revoked.  Performed at [facility], [place] [address], [place], [place] [address]   Blood culture (routine x 2)     Status: None (Preliminary result)   Collection Time: 08/31/21  1:37 AM   Specimen: BLOOD  Result Value Ref Range Status   Specimen Description   Final    BLOOD RIGHT ANTECUBITAL Performed at [facility] [address], [place], [place] [address]    Special Requests   Final    BOTTLES DRAWN AEROBIC AND ANAEROBIC Blood Culture adequate volume Performed at [facility] [address] [place], [place] [address]    Culture   Final    NO GROWTH 2 DAYS Performed at [facility] [address], [place], [place] [address]    Report Status PENDING  Incomplete  Blood culture (routine x 2)     Status: None (Preliminary result)   Collection Time: 08/31/21  1:42 AM   Specimen: BLOOD  Result Value Ref Range Status   Specimen Description   Final    BLOOD RIGHT HAND Performed at [facility] [address], [place], [place] [address]     Special Requests   Final    BOTTLES DRAWN AEROBIC AND ANAEROBIC Blood Culture adequate volume Performed at [facility] [address], [place], [place] [address]    Culture   Final    NO GROWTH 2 DAYS Performed at [facility] [address], [place], [place] [address]    Report Status PENDING  Incomplete  Urine Culture     Status: Abnormal   Collection Time: 08/31/21 10:07 AM   Specimen: Urine, Clean Catch  Result Value Ref Range Status   Specimen Description   Final    URINE, CLEAN CATCH Performed at [facility], [place] [address], [place], [place] [address]    Special Requests   Final    NONE Performed at [facility], [place] [address] [place], [place] [address]    Culture (A)  Final    <10,000 COLONIES/mL INSIGNIFICANT GROWTH Performed at [facility] [address], [place], [place] [address]    Report Status 09/01/2021 FINAL  Final  Expectorated Sputum Assessment w Gram Stain, Rflx to Resp Cult     Status: None   Collection Time: 08/31/21 12:55 PM   Specimen: Sputum  Result Value Ref Range Status   Specimen Description   Final    SPUTUM Performed at [facility] [address], [place], [place] [address]    Special Requests   Final    NONE Performed at [facility], [place] [address] [place], [place] [address]    Sputum evaluation   Final    THIS SPECIMEN IS ACCEPTABLE FOR SPUTUM CULTURE Performed at [facility], [place]  [address] [place], [place] [address]    Report Status 08/31/2021 FINAL  Final  Culture, Respiratory w Gram Stain     Status: None (Preliminary result)   Collection Time: 08/31/21 12:55 PM   Specimen: SPU  Result Value Ref Range Status   Specimen Description SPUTUM  Final   Special Requests NONE Reflexed from [id number]  Final   Gram Stain   Final    ABUNDANT WBC PRESENT,BOTH PMN AND MONONUCLEAR FEW GRAM POSITIVE COCCI RARE GRAM  NEGATIVE RODS RARE GRAM POSITIVE RODS    Culture   Final    CULTURE REINCUBATED FOR BETTER GROWTH Performed at [facility], [place] [address], [place], [place] [address]    Report Status PENDING  Incomplete    FURTHER DISCHARGE INSTRUCTIONS:  Get Medicines reviewed and adjusted: Please take all your medications with you for your next visit with your Primary MD  Laboratory/radiological data: Please request your Primary MD to go over all hospital tests and procedure/radiological results at the follow up, please ask your Primary MD to get all Hospital records sent to his/her office.  In some cases, they will be blood work, cultures and biopsy results pending at the time of your discharge. Please request that your primary care M.D. goes through all the records of your hospital data and follows up on these results.  Also Note the following: If you experience worsening of your admission symptoms, develop shortness of breath, life threatening emergency, suicidal or homicidal thoughts you must seek medical attention immediately by calling 911 or calling your MD immediately  if symptoms less severe.  You must read complete instructions/literature along with all the possible adverse reactions/side effects for all the Medicines you take and that have been prescribed to you. Take any new Medicines after you have completely understood and accpet all the possible adverse reactions/side effects.   Do not drive when taking Pain medications or sleeping medications (Benzodaizepines)  Do not take more than prescribed Pain, Sleep and Anxiety Medications. It is not advisable to combine anxiety,sleep and pain medications without talking with your primary care practitioner  Special Instructions: If you have smoked or chewed Tobacco  in the last 2 yrs please stop smoking, stop any regular Alcohol  and or any Recreational drug use.  Wear Seat belts while driving.  Please note: You were cared for by a  hospitalist during your hospital stay. Once you are discharged, your primary care physician will handle any further medical issues. Please note that NO REFILLS for any discharge medications will be authorized once you are discharged, as it is imperative that you return to your primary care physician (or establish a relationship with a primary care physician if you do not have one) for your post hospital discharge needs so that they can reassess your need for medications and monitor your lab values.  Total Time spent coordinating discharge including counseling, education and face to face time equals 35 minutes.  SignedOren Binet 09/02/2021 12:41 PM

## 2021-09-02 NOTE — Progress Notes (Addendum)
[facility] Progress Note  Name: Mark [name] MRN: [id number] DOB: 1951/04/10  Chief Complaint:  Shortness of breath  Subjective:  last HD on 1/2 with 3 kg UF.  His scheduled TTS treatment was moved to today due to staffing.  He isn't sure when he is discharged.  Normally leg is weighed separately at HD and he states was weighed with his leg.  He tells me he does take coreg - he's not sure of the dose.  He states that he only takes midodrine occasionally - "when they tell me to" but he does get mixed up about his meds before coming to this conclusion.   Review of systems:   Reports shortness of breath better  Denies n/v No chest pain  ---------------- Background on consult:  JERRID FORGETTE is a 71 y.o. male with a history of ESRD, hypertension, type 2 diabetes mellitus, PAD, seizure disorder and prior cocaine abuse who presented to the hospital with a cough and shortness of breath.  He also reported chills fatigue.  He presented to [facility] and was transferred to [facility] given the need for dialysis.  Nephrology is consulted for assistance with management of hemodialysis.  He has missed several treatments stating that he did not feel up to attending his scheduled hemodialysis.  He had HD on 12/27 and 12/20 and 12/15 recently from what I am able to see.  Last post weight on 12/27 was 111.5 kg. He clarifies that his cocaine use is not recent.  Seen on HD and procedure supervised.  144/83 and HR 91 with Left arm AVF in use.  Tolerating goal.  He states at first that he is on midodrine to lower his blood pressure then states that he takes before dialysis then states BID.  Unsure what he's actually taking.  He states that he is on coreg and has a history of arrhythmia.   Intake/Output Summary (Last 24 hours) at 09/02/2021 0908 Last data filed at 09/02/2021 0000 Gross per 24 hour  Intake 630 ml  Output --  Net 630 ml    Vitals:  Vitals:   09/02/21 0323 09/02/21 0737 09/02/21  0756 09/02/21 0805  BP: 126/74 139/79 (!) 147/60 (!) 129/55  Pulse: 96  88 88  Resp: 20  19 19   Temp: 98 F (36.7 C)  98.3 F (36.8 C)   TempSrc: Oral  Oral   SpO2: 94%  96% 96%  Weight:   115.1 kg   Height:         Physical Exam:  General: adult male in bed in NAD at rest HEENT: NCAT Eyes: EOMI sclera anicteric Neck: supple trachea midline Heart: S1S2 no rub Lungs: clear but reduced on auscultation; normal work of breathing at rest on room air Abdomen: softly distended/obese/nontender Extremities: left BKA and no edema right LE Skin: no rash on extremities exposed  Neuro: alert and oriented x 3 provides hx and follows commands  Psych normal mood and affect Access LUE AVF in use  Medications reviewed   Labs:  BMP Latest Ref Rng & Units 09/01/2021 08/31/2021 08/30/2021  Glucose 70 - 99 mg/dL 168(H) 96 134(H)  BUN 8 - 23 mg/dL 47(H) 111(H) 101(H)  Creatinine 0.61 - 1.24 mg/dL 10.57(H) 18.39(H) 17.00(H)  BUN/Creat Ratio 10 - 24 - - -  Sodium 135 - 145 mmol/L 137 142 140  Potassium 3.5 - 5.1 mmol/L 3.5 3.8 3.9  Chloride 98 - 111 mmol/L 97(L) 108 107  CO2 22 - 32 mmol/L  25 21(L) 20(L)  Calcium 8.9 - 10.3 mg/dL 9.0 8.7(L) 8.8(L)   Outpatient HD:  [facility] T[facility]  4 hours  BF 400 / DF 800  2K / 2 Ca bath  107 kg EDW AVF Meds: sensipar 30 mg three times a week  Mircera 60 mcg every 2 weeks - last given on 08/06/21   Calcitriol 0.75 mcg three times a week   Assessment/Plan:   # ESRD - HD today.  Normally per TTS schedule.  Will need to assess EDW at HD unit. Appears weighed with his leg today.    # PNA - Per primary team - he has been given ceftriaxone and azithro     # Medical noncompliance - Encouraged compliance with HD   # HTN - charted as hx of such but now appears to be on midodrine which he tells me he rarely takes. Note also charted as on coreg but not on it here.  He doesn't know dose but is on coreg 25 mg BID per [facility] list.  Team is going to start a  low dose of coreg (I.e. coreg 3.125 mg BID or 6.25 mg BID).  He states that he has a history of arrhythmia.  I do not believe his charted weight is correct.  May have been weighed with leg.     # Anemia CKD - aranesp at 60 mcg weekly on mondays for now   # Metabolic bone disease  - on sensipar and calcitriol outpatient - resumed here.  Note both are given with HD and should not be included on the discharge summary. resumed home fosrenol.    Disposition per primary team.  Discussed with team and they are discharging him today  Claudia Desanctis, MD 09/02/2021 9:25 AM    Seen and examined on dialysis.  Blood pressure 103/87 and HR 94.  Tolerating goal.  Left AVF in use.  Procedure supervised  Claudia Desanctis, MD 09/02/2021   9:27 AM

## 2021-09-02 NOTE — Progress Notes (Signed)
Pt for d/c today. Pt receives out-pt HD at [facility] on TTS. Pt has a 10:45 chair time. Contacted clinic and spoke to Chaplin. Clinic advised of pt's d/c today and pt will resume care tomorrow.   Melven Sartorius Renal Navigator [phone]

## 2021-09-02 NOTE — TOC Transition Note (Signed)
Transition of Care [facility]) - CM/SW Discharge Note   Patient Details  Name: Mark [name] MRN: [id number] Date of Birth: 08-Jun-1951  Transition of [facility] CM/SW Contact:  Cyndi Bender, RN Phone Number: 09/02/2021, 1:03 PM   Clinical Narrative:    Patient stable for discharge. Patient states he has transportation home and take [employer] to apts. No other needs    Final next level of care: Home/Self Care Barriers to Discharge: Barriers Resolved   Patient Goals and CMS Choice Patient states their goals for this hospitalization and ongoing recovery are:: return home      Discharge Placement               home        Discharge Plan and Services               Home                       Social Determinants of Health (SDOH) Interventions     Readmission Risk Interventions Readmission Risk Prevention Plan 09/02/2021  Transportation Screening Complete  Medication Review ([name]) Complete  PCP or Specialist appointment within 3-5 days of discharge Complete  HRI or [name] Complete  SW Recovery Care/Counseling Consult Patient refused  Palliative Care Screening Not [name] Not Applicable  Some recent data might be hidden

## 2021-09-03 ENCOUNTER — Telehealth: Payer: Self-pay

## 2021-09-03 DIAGNOSIS — N2581 Secondary hyperparathyroidism of renal origin: Secondary | ICD-10-CM | POA: Diagnosis not present

## 2021-09-03 DIAGNOSIS — D509 Iron deficiency anemia, unspecified: Secondary | ICD-10-CM | POA: Diagnosis not present

## 2021-09-03 DIAGNOSIS — Z992 Dependence on renal dialysis: Secondary | ICD-10-CM | POA: Diagnosis not present

## 2021-09-03 DIAGNOSIS — D631 Anemia in chronic kidney disease: Secondary | ICD-10-CM | POA: Diagnosis not present

## 2021-09-03 DIAGNOSIS — D472 Monoclonal gammopathy: Secondary | ICD-10-CM | POA: Diagnosis not present

## 2021-09-03 DIAGNOSIS — D688 Other specified coagulation defects: Secondary | ICD-10-CM | POA: Diagnosis not present

## 2021-09-03 DIAGNOSIS — N186 End stage renal disease: Secondary | ICD-10-CM | POA: Diagnosis not present

## 2021-09-03 LAB — CULTURE, RESPIRATORY W GRAM STAIN: Culture: NORMAL

## 2021-09-03 MED ORDER — BENZONATATE 200 MG PO CAPS: 200.0000 mg | ORAL_CAPSULE | Freq: Two times a day (BID) | ORAL | 0 refills | Status: DC | PRN

## 2021-09-03 NOTE — Telephone Encounter (Signed)
Transition Care Management Follow-up Telephone Call Date of discharge and from where: 09/02/2021, [facility]  How have you been since you were released from the hospital? He said he is still coughing and is having difficulty keeping any food down due to the coughing. He did not attend his dialysis session this morning and said he will need to get back on track.  He attends HD: T/T/S at [facility] Any questions or concerns? Yes - noted above.  Items Reviewed: Did the pt receive and understand the discharge instructions provided? Yes  Medications obtained and verified? Yes  - he said he has all medications and manages his med regime by himself. He has a working glucometer but needs to call the pharmacy to obtain a refill of test strips.  Other? No  Any new allergies since your discharge? No  Dietary orders reviewed? Yes Do you have support at home? Yes , his wife.  Home Care and Equipment/Supplies: Were home health services ordered? no If so, what is the name of the agency? N/a  Has the agency set up a time to come to the patient's home? not applicable Were any new equipment or medical supplies ordered?  No What is the name of the medical supply agency? N/a Were you able to get the supplies/equipment? not applicable Do you have any questions related to the use of the equipment or supplies? No  Functional Questionnaire: (I = Independent and D = Dependent) ADLs: He said he is independent with personal care. Has power wheelchair for mobility.   Follow up appointments reviewed:  PCP Hospital f/u appt confirmed? Yes  Scheduled to see Dr Wynetta Emery - 09/21/2021.  [facility] f/u appt confirmed? Yes  Scheduled to see Neurology- 09/16/2021.   Are transportation arrangements needed? No - he uses [employer] If their condition worsens, is the pt aware to call PCP or go to the Emergency Dept.? Yes Was the patient provided with contact information for the PCP's office or ED? Yes Was to pt  encouraged to call back with questions or concerns? Yes

## 2021-09-03 NOTE — Telephone Encounter (Signed)
Contacted pt and lvm making pt aware of provider message and if he has any questions or concerns to give [place] a call

## 2021-09-04 ENCOUNTER — Telehealth: Payer: Self-pay | Admitting: Nephrology

## 2021-09-04 NOTE — Telephone Encounter (Signed)
Transition of Care Contact from [place]   Date of Discharge: 09/02/2021 Date of Contact: 09/04/2021 Method of contact: phone Talked to patient   Patient contacted to discuss transition of care form recent hospitaliztion. Patient was admitted to [facility] from 08/30/21 to 09/02/21 with the discharge diagnosis of CAP, ESRD with non compliance with dialysis.     Medication changes were reviewed.  Patient will follow up with is outpatient dialysis center tomorrow 09/05/20, missed HD yesterday but reports he will be there tomorrow.    Other follow up needs include non identified.   Jen Mow, PA-C [facility] Pager: [phone]

## 2021-09-05 DIAGNOSIS — D472 Monoclonal gammopathy: Secondary | ICD-10-CM | POA: Diagnosis not present

## 2021-09-05 DIAGNOSIS — D631 Anemia in chronic kidney disease: Secondary | ICD-10-CM | POA: Diagnosis not present

## 2021-09-05 DIAGNOSIS — Z992 Dependence on renal dialysis: Secondary | ICD-10-CM | POA: Diagnosis not present

## 2021-09-05 DIAGNOSIS — N186 End stage renal disease: Secondary | ICD-10-CM | POA: Diagnosis not present

## 2021-09-05 DIAGNOSIS — D509 Iron deficiency anemia, unspecified: Secondary | ICD-10-CM | POA: Diagnosis not present

## 2021-09-05 DIAGNOSIS — D688 Other specified coagulation defects: Secondary | ICD-10-CM | POA: Diagnosis not present

## 2021-09-05 DIAGNOSIS — N2581 Secondary hyperparathyroidism of renal origin: Secondary | ICD-10-CM | POA: Diagnosis not present

## 2021-09-05 LAB — CULTURE, BLOOD (ROUTINE X 2)
Culture: NO GROWTH
Culture: NO GROWTH
Special Requests: ADEQUATE
Special Requests: ADEQUATE

## 2021-09-08 DIAGNOSIS — D509 Iron deficiency anemia, unspecified: Secondary | ICD-10-CM | POA: Diagnosis not present

## 2021-09-08 DIAGNOSIS — D631 Anemia in chronic kidney disease: Secondary | ICD-10-CM | POA: Diagnosis not present

## 2021-09-08 DIAGNOSIS — Z992 Dependence on renal dialysis: Secondary | ICD-10-CM | POA: Diagnosis not present

## 2021-09-08 DIAGNOSIS — D472 Monoclonal gammopathy: Secondary | ICD-10-CM | POA: Diagnosis not present

## 2021-09-08 DIAGNOSIS — N186 End stage renal disease: Secondary | ICD-10-CM | POA: Diagnosis not present

## 2021-09-08 DIAGNOSIS — N2581 Secondary hyperparathyroidism of renal origin: Secondary | ICD-10-CM | POA: Diagnosis not present

## 2021-09-08 DIAGNOSIS — D688 Other specified coagulation defects: Secondary | ICD-10-CM | POA: Diagnosis not present

## 2021-09-10 DIAGNOSIS — Z992 Dependence on renal dialysis: Secondary | ICD-10-CM | POA: Diagnosis not present

## 2021-09-10 DIAGNOSIS — N186 End stage renal disease: Secondary | ICD-10-CM | POA: Diagnosis not present

## 2021-09-10 DIAGNOSIS — D688 Other specified coagulation defects: Secondary | ICD-10-CM | POA: Diagnosis not present

## 2021-09-10 DIAGNOSIS — D631 Anemia in chronic kidney disease: Secondary | ICD-10-CM | POA: Diagnosis not present

## 2021-09-10 DIAGNOSIS — N2581 Secondary hyperparathyroidism of renal origin: Secondary | ICD-10-CM | POA: Diagnosis not present

## 2021-09-10 DIAGNOSIS — D472 Monoclonal gammopathy: Secondary | ICD-10-CM | POA: Diagnosis not present

## 2021-09-10 DIAGNOSIS — D509 Iron deficiency anemia, unspecified: Secondary | ICD-10-CM | POA: Diagnosis not present

## 2021-09-15 ENCOUNTER — Telehealth: Payer: Self-pay

## 2021-09-15 DIAGNOSIS — N186 End stage renal disease: Secondary | ICD-10-CM | POA: Diagnosis not present

## 2021-09-15 DIAGNOSIS — N2581 Secondary hyperparathyroidism of renal origin: Secondary | ICD-10-CM | POA: Diagnosis not present

## 2021-09-15 DIAGNOSIS — D509 Iron deficiency anemia, unspecified: Secondary | ICD-10-CM | POA: Diagnosis not present

## 2021-09-15 DIAGNOSIS — D688 Other specified coagulation defects: Secondary | ICD-10-CM | POA: Diagnosis not present

## 2021-09-15 DIAGNOSIS — D631 Anemia in chronic kidney disease: Secondary | ICD-10-CM | POA: Diagnosis not present

## 2021-09-15 DIAGNOSIS — Z992 Dependence on renal dialysis: Secondary | ICD-10-CM | POA: Diagnosis not present

## 2021-09-15 DIAGNOSIS — D472 Monoclonal gammopathy: Secondary | ICD-10-CM | POA: Diagnosis not present

## 2021-09-15 NOTE — Telephone Encounter (Signed)
Contacted pt to schedule Medicare Wellness pt didn't answer and was unable to lvm

## 2021-09-16 ENCOUNTER — Ambulatory Visit (INDEPENDENT_AMBULATORY_CARE_PROVIDER_SITE_OTHER): Payer: Medicare Other | Admitting: Diagnostic Neuroimaging

## 2021-09-16 ENCOUNTER — Other Ambulatory Visit: Payer: Self-pay

## 2021-09-16 ENCOUNTER — Encounter: Payer: Self-pay | Admitting: Diagnostic Neuroimaging

## 2021-09-16 VITALS — BP 122/73 | HR 91 | Ht 74.0 in | Wt 253.0 lb

## 2021-09-16 DIAGNOSIS — Y841 Kidney dialysis as the cause of abnormal reaction of the patient, or of later complication, without mention of misadventure at the time of the procedure: Secondary | ICD-10-CM

## 2021-09-16 DIAGNOSIS — R69 Illness, unspecified: Secondary | ICD-10-CM | POA: Diagnosis not present

## 2021-09-16 DIAGNOSIS — G4489 Other headache syndrome: Secondary | ICD-10-CM

## 2021-09-16 NOTE — Progress Notes (Signed)
[facility]  PATIENT: Mark [name] DOB: September 13, 1950  REFERRING CLINICIAN: [name], MD HISTORY FROM: patient  REASON FOR VISIT:  follow up   HISTORICAL  CHIEF COMPLAINT:  Chief Complaint  Patient presents with   Headache    Rm 7 pt of Dr Jannifer Franklin, 3 month FU  "headaches every time I come out of dialysis; recent hospitalization for pneumonia"     HISTORY OF PRESENT ILLNESS:   UPDATE (09/16/21, [facility]): Since last visit, doing well and about hte same. Some HA during and after dialysis (Tu, Thurs, Sat). Using tylenol.  PRIOR HPI (06/12/21, [place]): Mark [name] is a 71 year old right-handed black male with a history of seizures associated with cocaine abuse and hypoglycemia.  He has a history of diabetes with a severe diabetic peripheral neuropathy.  He has peripheral vascular disease and has a left below-knee amputation.  He has end-stage renal disease on hemodialysis.  He started dialysis in April 2022.  Around that time, he began to have increasing problems with dizziness with standing, sometimes associated with syncope.  He would note onset of headache that would occur associated with the dizziness.  If he would lie down, the headache and dizziness would improve.  He notes that the episodes of headache and dizziness tend to occur on the days of dialysis, on the off days he is able to do more normal activities such as cut the grass.  He is having 1 or 2 headaches a week at this point, the headaches are around the top of the head associated with a pressure sensation.  He reports that the headaches only last a few minutes.  He may take Tylenol 500 mg tablets for the headache.  He at times notes that there seems to be a brightness of the vision when the headaches come on.  When he has significant drops of blood pressure he sometimes will have discomfort down the left arm.  He is on ProAmatine without much benefit.  He has been noted to have significant hypotension during  dialysis, oftentimes needs to be put into Trendelenburg.  They have stopped his blood pressure medications.  He has not had any further seizures.  He does report some numbness in the fingers of the hands and in the right foot.  He is able to ambulate with a walker.  He has not noted any new weakness.  He is sent to this office for further evaluation.  He also notes some changes in memory that have occurred.  He lives at home with his wife.    REVIEW OF SYSTEMS: Full 14 system review of systems performed and negative with exception of: as per HPI.   ALLERGIES: No Known Allergies  HOME MEDICATIONS: Outpatient Medications Prior to Visit  Medication Sig Dispense Refill   acetaminophen (TYLENOL) 500 MG tablet Take 500 mg by mouth every 6 (six) hours as needed for moderate pain or headache.     atorvastatin (LIPITOR) 20 MG tablet Take 1 tablet (20 mg total) by mouth daily. 90 tablet 1   benzonatate (TESSALON) 200 MG capsule Take 1 capsule (200 mg total) by mouth 2 (two) times daily as needed for cough. 20 capsule 0   Blood Glucose Monitoring Suppl (ONETOUCH VERIO) w/Device KIT Use as directed to test blood sugar three times daily. 1 kit 0   carvedilol (COREG) 3.125 MG tablet Take 1 tablet (3.125 mg total) by mouth 2 (two) times daily. 60 tablet 11   cholecalciferol (VITAMIN D3) 25  MCG (1000 UNIT) tablet Take 1,000 Units by mouth daily.     furosemide (LASIX) 80 MG tablet Take 80 mg by mouth daily.     glucose blood (ONETOUCH VERIO) test strip USE AS DIRECTED THREE TIMES DAILY TO  TEST  BLOOD  SUGAR 100 each 11   insulin glargine (LANTUS SOLOSTAR) 100 UNIT/ML Solostar Pen Inject 19 Units into the skin at bedtime. (Patient taking differently: Inject 18 Units into the skin in the morning.) 15 mL 3   Insulin Pen Needle (TRUEPLUS 5-BEVEL PEN NEEDLES) 32G X 4 MM MISC Use to administer Lantus once daily. 100 each 2   Insulin Syringe-Needle U-100 (INSULIN SYRINGE .5CC/30GX5/16") 30G X 5/16" 0.5 ML MISC Check  blood sugar TID & QHS 100 each 2   lanthanum (FOSRENOL) 1000 MG chewable tablet Chew 1,000 mg by mouth as needed. Take 1 tablet with each meal & snack     midodrine (PROAMATINE) 5 MG tablet Take 5 mg by mouth in the morning and at bedtime. Do not takes in bp is lower 130/80     Multiple Vitamin (MULTIVITAMIN WITH MINERALS) TABS tablet Take 1 tablet by mouth daily.     OneTouch Delica Lancets 03T MISC Use as directed to test blood sugar three times daily. 100 each 12   No facility-administered medications prior to visit.      PHYSICAL EXAM  GENERAL EXAM/CONSTITUTIONAL: Vitals:  Vitals:   09/16/21 1401  BP: 122/73  Pulse: 91  Weight: 253 lb (114.8 kg)  Height: 6' 2" (1.88 m)   Body mass index is 32.48 kg/m. Wt Readings from Last 3 Encounters:  09/16/21 253 lb (114.8 kg)  09/02/21 242 lb 11.6 oz (110.1 kg)  06/12/21 191 lb (86.6 kg)   Patient is in no distress; well developed, nourished and groomed; neck is supple  CARDIOVASCULAR: Examination of carotid arteries is normal; no carotid bruits Regular rate and rhythm, no murmurs Examination of peripheral vascular system by observation and palpation is normal  EYES: Ophthalmoscopic exam of optic discs and posterior segments is normal; no papilledema or hemorrhages No results found.  MUSCULOSKELETAL: Gait, strength, tone, movements noted in Neurologic exam below  NEUROLOGIC: MENTAL STATUS:  No flowsheet data found. awake, alert, oriented to person, place and time recent and remote memory intact normal attention and concentration language fluent, comprehension intact, naming intact fund of knowledge appropriate  CRANIAL NERVE:  2nd - no papilledema on fundoscopic exam 2nd, 3rd, 4th, 6th - pupils equal and reactive to light, visual fields full to confrontation, extraocular muscles intact, no nystagmus 5th - facial sensation symmetric 7th - facial strength symmetric 8th - hearing intact 9th - palate elevates  symmetrically, uvula midline 11th - shoulder shrug symmetric 12th - tongue protrusion midline  MOTOR:  normal bulk and tone, full strength in the BUE, BLE; LEFT LEG BELOW THE KNEE AMPUTATION WITH PROSTHESIS  SENSORY:  normal and symmetric to light touch, temperature, vibration  COORDINATION:  finger-nose-finger, fine finger movements normal  REFLEXES:  deep tendon reflexes TRACE and symmetric  GAIT/STATION:  narrow based gait; USING WALKER     DIAGNOSTIC DATA (LABS, IMAGING, TESTING) - I reviewed patient records, labs, notes, testing and imaging myself where available.  Lab Results  Component Value Date   WBC 8.0 09/01/2021   HGB 8.9 (L) 09/01/2021   HCT 27.8 (L) 09/01/2021   MCV 93.6 09/01/2021   PLT 392 09/01/2021      Component Value Date/Time   NA 137 09/01/2021 0130  NA 145 (H) 10/18/2017 1424   K 3.5 09/01/2021 0130   CL 97 (L) 09/01/2021 0130   CO2 25 09/01/2021 0130   GLUCOSE 168 (H) 09/01/2021 0130   BUN 47 (H) 09/01/2021 0130   BUN 54 (H) 10/18/2017 1424   CREATININE 10.57 (H) 09/01/2021 0130   CREATININE 11.14 (HH) 07/13/2021 0830   CREATININE 2.16 (H) 11/16/2016 1148   CALCIUM 9.0 09/01/2021 0130   PROT 7.0 09/01/2021 0130   PROT 7.9 02/03/2021 0922   ALBUMIN 2.6 (L) 09/01/2021 0130   ALBUMIN 3.9 02/03/2021 0922   AST 11 (L) 09/01/2021 0130   AST 11 (L) 07/13/2021 0830   ALT 11 09/01/2021 0130   ALT 9 07/13/2021 0830   ALKPHOS 75 09/01/2021 0130   BILITOT 1.0 09/01/2021 0130   BILITOT 0.3 07/13/2021 0830   GFRNONAA 5 (L) 09/01/2021 0130   GFRNONAA 5 (L) 07/13/2021 0830   GFRNONAA 31 (L) 11/16/2016 1148   GFRAA 8 (L) 05/30/2020 1111   GFRAA 7 (L) 02/18/2020 1357   GFRAA 36 (L) 11/16/2016 1148   Lab Results  Component Value Date   CHOL 126 05/05/2018   HDL 44 05/05/2018   LDLCALC 70 05/05/2018   TRIG 60 05/05/2018   CHOLHDL 2.9 05/05/2018   Lab Results  Component Value Date   HGBA1C 8.5 (H) 08/31/2021   Lab Results  Component  Value Date   VITAMINB12 467 09/04/2017   Lab Results  Component Value Date   TSH 1.134 01/02/2016    11/12/19 EEG - normal EEG recording in the waking and drowsy state. No evidence of ictal or interictal discharges are seen.   11/24/19 MRI of the brain without contrast shows the following: 1.   There were no acute findings. 2.   Mild generalized cortical atrophy.  The temporal horn of the left lateral ventricle is larger than the right.  Adjacent hippocampus and temporal lobe abnormal signal.  No definite change compared to the 2017 MRI. 3.   Scattered T2/FLAIR hyperintense foci consistent with moderate for age chronic microvascular ischemic changes, progressed compared to the 2017 MRI. 4.   Ethmoid and right frontal chronic sinusitis.   ASSESSMENT AND PLAN  71 y.o. year old male here with:   Dx:  1. Dialysis headache      PLAN:  HEADACHES ASSOCIATED WITH HEMODIALYSIS - continue tylenol as needed - adjust volume and rate of H.D. per nephrology to reduce headaches  Single seizure event in 2017 in the setting of cocaine use and hypoglycemia - He has been off Vimpat in 2020, due to reported side effect of hallucinations and bad dreams.  He has not had recurrent seizure.  Return for return to PCP.  I spent 15 minutes of face-to-face and non-face-to-face time with patient.  This included previsit chart review, lab review, study review, order entry, electronic health record documentation, patient education.     Penni Bombard, MD 0/35/4656, 8:12 PM Certified in Neurology, Neurophysiology and Neuroimaging  [facility] [address] [place], [place] [address] [phone]

## 2021-09-17 DIAGNOSIS — D509 Iron deficiency anemia, unspecified: Secondary | ICD-10-CM | POA: Diagnosis not present

## 2021-09-17 DIAGNOSIS — D472 Monoclonal gammopathy: Secondary | ICD-10-CM | POA: Diagnosis not present

## 2021-09-17 DIAGNOSIS — Z992 Dependence on renal dialysis: Secondary | ICD-10-CM | POA: Diagnosis not present

## 2021-09-17 DIAGNOSIS — N2581 Secondary hyperparathyroidism of renal origin: Secondary | ICD-10-CM | POA: Diagnosis not present

## 2021-09-17 DIAGNOSIS — N186 End stage renal disease: Secondary | ICD-10-CM | POA: Diagnosis not present

## 2021-09-17 DIAGNOSIS — D631 Anemia in chronic kidney disease: Secondary | ICD-10-CM | POA: Diagnosis not present

## 2021-09-17 DIAGNOSIS — D688 Other specified coagulation defects: Secondary | ICD-10-CM | POA: Diagnosis not present

## 2021-09-19 DIAGNOSIS — D472 Monoclonal gammopathy: Secondary | ICD-10-CM | POA: Diagnosis not present

## 2021-09-19 DIAGNOSIS — N186 End stage renal disease: Secondary | ICD-10-CM | POA: Diagnosis not present

## 2021-09-19 DIAGNOSIS — D688 Other specified coagulation defects: Secondary | ICD-10-CM | POA: Diagnosis not present

## 2021-09-19 DIAGNOSIS — Z992 Dependence on renal dialysis: Secondary | ICD-10-CM | POA: Diagnosis not present

## 2021-09-19 DIAGNOSIS — D631 Anemia in chronic kidney disease: Secondary | ICD-10-CM | POA: Diagnosis not present

## 2021-09-19 DIAGNOSIS — N2581 Secondary hyperparathyroidism of renal origin: Secondary | ICD-10-CM | POA: Diagnosis not present

## 2021-09-19 DIAGNOSIS — D509 Iron deficiency anemia, unspecified: Secondary | ICD-10-CM | POA: Diagnosis not present

## 2021-09-21 ENCOUNTER — Ambulatory Visit: Payer: Commercial Managed Care - HMO | Attending: Internal Medicine | Admitting: Internal Medicine

## 2021-09-21 ENCOUNTER — Other Ambulatory Visit: Payer: Self-pay

## 2021-09-21 ENCOUNTER — Encounter: Payer: Self-pay | Admitting: Internal Medicine

## 2021-09-21 VITALS — BP 153/81 | HR 80 | Resp 16

## 2021-09-21 DIAGNOSIS — Z23 Encounter for immunization: Secondary | ICD-10-CM

## 2021-09-21 DIAGNOSIS — I10 Essential (primary) hypertension: Secondary | ICD-10-CM

## 2021-09-21 DIAGNOSIS — Z09 Encounter for follow-up examination after completed treatment for conditions other than malignant neoplasm: Secondary | ICD-10-CM | POA: Diagnosis not present

## 2021-09-21 DIAGNOSIS — J189 Pneumonia, unspecified organism: Secondary | ICD-10-CM

## 2021-09-21 DIAGNOSIS — Z992 Dependence on renal dialysis: Secondary | ICD-10-CM

## 2021-09-21 DIAGNOSIS — I5032 Chronic diastolic (congestive) heart failure: Secondary | ICD-10-CM | POA: Diagnosis not present

## 2021-09-21 DIAGNOSIS — Z794 Long term (current) use of insulin: Secondary | ICD-10-CM

## 2021-09-21 DIAGNOSIS — E1159 Type 2 diabetes mellitus with other circulatory complications: Secondary | ICD-10-CM

## 2021-09-21 DIAGNOSIS — N186 End stage renal disease: Secondary | ICD-10-CM | POA: Diagnosis not present

## 2021-09-21 DIAGNOSIS — Z89512 Acquired absence of left leg below knee: Secondary | ICD-10-CM | POA: Diagnosis not present

## 2021-09-21 MED ORDER — ZOSTER VAC RECOMB ADJUVANTED 50 MCG/0.5ML IM SUSR: 0.5000 mL | Freq: Once | INTRAMUSCULAR | 0 refills | Status: AC

## 2021-09-21 MED ORDER — FUROSEMIDE 80 MG PO TABS: 80.0000 mg | ORAL_TABLET | Freq: Every day | ORAL | 3 refills | Status: DC

## 2021-09-21 MED ORDER — LANTUS SOLOSTAR 100 UNIT/ML ~~LOC~~ SOPN: 20.0000 [IU] | PEN_INJECTOR | Freq: Every day | SUBCUTANEOUS | 3 refills | Status: AC

## 2021-09-21 NOTE — Patient Instructions (Signed)
Your blood pressure is elevated.  Decrease midodrine to 2 times a day.  Your blood sugars are not at goal.  The goal is for blood sugars to be between 90-130 before meals.  Increase your Lantus insulin from 18 units daily to 20 units daily.  I have submitted a referral for you to see Dr. Katy Fitch for your eye exam.  I have given you a prescription to get the shingles vaccine at your pharmacy.
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
## 2021-09-22 ENCOUNTER — Telehealth: Payer: Self-pay

## 2021-09-22 DIAGNOSIS — Z992 Dependence on renal dialysis: Secondary | ICD-10-CM | POA: Diagnosis not present

## 2021-09-22 DIAGNOSIS — D509 Iron deficiency anemia, unspecified: Secondary | ICD-10-CM | POA: Diagnosis not present

## 2021-09-22 DIAGNOSIS — N186 End stage renal disease: Secondary | ICD-10-CM | POA: Diagnosis not present

## 2021-09-22 DIAGNOSIS — D631 Anemia in chronic kidney disease: Secondary | ICD-10-CM | POA: Diagnosis not present

## 2021-09-22 DIAGNOSIS — N2581 Secondary hyperparathyroidism of renal origin: Secondary | ICD-10-CM | POA: Diagnosis not present

## 2021-09-22 DIAGNOSIS — D688 Other specified coagulation defects: Secondary | ICD-10-CM | POA: Diagnosis not present

## 2021-09-22 DIAGNOSIS — D472 Monoclonal gammopathy: Secondary | ICD-10-CM | POA: Diagnosis not present

## 2021-09-22 NOTE — Telephone Encounter (Signed)
Patient called into the office stating that he needs new liners for his prosthetic.  Please advise

## 2021-09-22 NOTE — Telephone Encounter (Signed)
Pt has not been in the office since 2020 can you please call and make an appt. Will need recent face to face visit for insurance to honor rx for prosthetic supplies.

## 2021-09-23 ENCOUNTER — Encounter (HOSPITAL_COMMUNITY): Payer: Self-pay | Admitting: *Deleted

## 2021-09-24 DIAGNOSIS — D688 Other specified coagulation defects: Secondary | ICD-10-CM | POA: Diagnosis not present

## 2021-09-24 DIAGNOSIS — D472 Monoclonal gammopathy: Secondary | ICD-10-CM | POA: Diagnosis not present

## 2021-09-24 DIAGNOSIS — D631 Anemia in chronic kidney disease: Secondary | ICD-10-CM | POA: Diagnosis not present

## 2021-09-24 DIAGNOSIS — N2581 Secondary hyperparathyroidism of renal origin: Secondary | ICD-10-CM | POA: Diagnosis not present

## 2021-09-24 DIAGNOSIS — Z992 Dependence on renal dialysis: Secondary | ICD-10-CM | POA: Diagnosis not present

## 2021-09-24 DIAGNOSIS — D509 Iron deficiency anemia, unspecified: Secondary | ICD-10-CM | POA: Diagnosis not present

## 2021-09-24 DIAGNOSIS — N186 End stage renal disease: Secondary | ICD-10-CM | POA: Diagnosis not present

## 2021-09-25 ENCOUNTER — Ambulatory Visit (INDEPENDENT_AMBULATORY_CARE_PROVIDER_SITE_OTHER): Payer: Medicare Other | Admitting: Family

## 2021-09-25 ENCOUNTER — Encounter: Payer: Self-pay | Admitting: Family

## 2021-09-25 ENCOUNTER — Other Ambulatory Visit: Payer: Self-pay

## 2021-09-25 DIAGNOSIS — Z89512 Acquired absence of left leg below knee: Secondary | ICD-10-CM | POA: Diagnosis not present
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
## 2021-09-26 DIAGNOSIS — D509 Iron deficiency anemia, unspecified: Secondary | ICD-10-CM | POA: Diagnosis not present

## 2021-09-26 DIAGNOSIS — D472 Monoclonal gammopathy: Secondary | ICD-10-CM | POA: Diagnosis not present

## 2021-09-26 DIAGNOSIS — N186 End stage renal disease: Secondary | ICD-10-CM | POA: Diagnosis not present

## 2021-09-26 DIAGNOSIS — Z992 Dependence on renal dialysis: Secondary | ICD-10-CM | POA: Diagnosis not present

## 2021-09-26 DIAGNOSIS — N2581 Secondary hyperparathyroidism of renal origin: Secondary | ICD-10-CM | POA: Diagnosis not present

## 2021-09-26 DIAGNOSIS — D688 Other specified coagulation defects: Secondary | ICD-10-CM | POA: Diagnosis not present

## 2021-09-26 DIAGNOSIS — D631 Anemia in chronic kidney disease: Secondary | ICD-10-CM | POA: Diagnosis not present

## 2021-09-28 ENCOUNTER — Encounter (HOSPITAL_COMMUNITY): Payer: Self-pay | Admitting: *Deleted

## 2021-09-29 DIAGNOSIS — N2581 Secondary hyperparathyroidism of renal origin: Secondary | ICD-10-CM | POA: Diagnosis not present

## 2021-09-29 DIAGNOSIS — D688 Other specified coagulation defects: Secondary | ICD-10-CM | POA: Diagnosis not present

## 2021-09-29 DIAGNOSIS — D631 Anemia in chronic kidney disease: Secondary | ICD-10-CM | POA: Diagnosis not present

## 2021-09-29 DIAGNOSIS — D472 Monoclonal gammopathy: Secondary | ICD-10-CM | POA: Diagnosis not present

## 2021-09-29 DIAGNOSIS — Z992 Dependence on renal dialysis: Secondary | ICD-10-CM | POA: Diagnosis not present

## 2021-09-29 DIAGNOSIS — D509 Iron deficiency anemia, unspecified: Secondary | ICD-10-CM | POA: Diagnosis not present

## 2021-09-29 DIAGNOSIS — E1129 Type 2 diabetes mellitus with other diabetic kidney complication: Secondary | ICD-10-CM | POA: Diagnosis not present

## 2021-09-29 DIAGNOSIS — N186 End stage renal disease: Secondary | ICD-10-CM | POA: Diagnosis not present

## 2021-10-03 DIAGNOSIS — D509 Iron deficiency anemia, unspecified: Secondary | ICD-10-CM | POA: Diagnosis not present

## 2021-10-03 DIAGNOSIS — D472 Monoclonal gammopathy: Secondary | ICD-10-CM | POA: Diagnosis not present

## 2021-10-03 DIAGNOSIS — N186 End stage renal disease: Secondary | ICD-10-CM | POA: Diagnosis not present

## 2021-10-03 DIAGNOSIS — D688 Other specified coagulation defects: Secondary | ICD-10-CM | POA: Diagnosis not present

## 2021-10-03 DIAGNOSIS — Z992 Dependence on renal dialysis: Secondary | ICD-10-CM | POA: Diagnosis not present

## 2021-10-03 DIAGNOSIS — N2581 Secondary hyperparathyroidism of renal origin: Secondary | ICD-10-CM | POA: Diagnosis not present

## 2021-10-03 DIAGNOSIS — D631 Anemia in chronic kidney disease: Secondary | ICD-10-CM | POA: Diagnosis not present

## 2021-10-06 DIAGNOSIS — N186 End stage renal disease: Secondary | ICD-10-CM | POA: Diagnosis not present

## 2021-10-06 DIAGNOSIS — D631 Anemia in chronic kidney disease: Secondary | ICD-10-CM | POA: Diagnosis not present

## 2021-10-06 DIAGNOSIS — D509 Iron deficiency anemia, unspecified: Secondary | ICD-10-CM | POA: Diagnosis not present

## 2021-10-06 DIAGNOSIS — D688 Other specified coagulation defects: Secondary | ICD-10-CM | POA: Diagnosis not present

## 2021-10-06 DIAGNOSIS — D472 Monoclonal gammopathy: Secondary | ICD-10-CM | POA: Diagnosis not present

## 2021-10-06 DIAGNOSIS — Z992 Dependence on renal dialysis: Secondary | ICD-10-CM | POA: Diagnosis not present

## 2021-10-06 DIAGNOSIS — N2581 Secondary hyperparathyroidism of renal origin: Secondary | ICD-10-CM | POA: Diagnosis not present

## 2021-10-08 DIAGNOSIS — Z992 Dependence on renal dialysis: Secondary | ICD-10-CM | POA: Diagnosis not present

## 2021-10-08 DIAGNOSIS — N2581 Secondary hyperparathyroidism of renal origin: Secondary | ICD-10-CM | POA: Diagnosis not present

## 2021-10-08 DIAGNOSIS — D631 Anemia in chronic kidney disease: Secondary | ICD-10-CM | POA: Diagnosis not present

## 2021-10-08 DIAGNOSIS — N186 End stage renal disease: Secondary | ICD-10-CM | POA: Diagnosis not present

## 2021-10-08 DIAGNOSIS — D509 Iron deficiency anemia, unspecified: Secondary | ICD-10-CM | POA: Diagnosis not present

## 2021-10-08 DIAGNOSIS — D472 Monoclonal gammopathy: Secondary | ICD-10-CM | POA: Diagnosis not present

## 2021-10-08 DIAGNOSIS — D688 Other specified coagulation defects: Secondary | ICD-10-CM | POA: Diagnosis not present

## 2021-10-09 DIAGNOSIS — R197 Diarrhea, unspecified: Secondary | ICD-10-CM | POA: Diagnosis not present

## 2021-10-09 DIAGNOSIS — R109 Unspecified abdominal pain: Secondary | ICD-10-CM | POA: Diagnosis not present

## 2021-10-10 DIAGNOSIS — D688 Other specified coagulation defects: Secondary | ICD-10-CM | POA: Diagnosis not present

## 2021-10-10 DIAGNOSIS — N186 End stage renal disease: Secondary | ICD-10-CM | POA: Diagnosis not present

## 2021-10-10 DIAGNOSIS — Z992 Dependence on renal dialysis: Secondary | ICD-10-CM | POA: Diagnosis not present

## 2021-10-10 DIAGNOSIS — D472 Monoclonal gammopathy: Secondary | ICD-10-CM | POA: Diagnosis not present

## 2021-10-10 DIAGNOSIS — D509 Iron deficiency anemia, unspecified: Secondary | ICD-10-CM | POA: Diagnosis not present

## 2021-10-10 DIAGNOSIS — N2581 Secondary hyperparathyroidism of renal origin: Secondary | ICD-10-CM | POA: Diagnosis not present

## 2021-10-10 DIAGNOSIS — D631 Anemia in chronic kidney disease: Secondary | ICD-10-CM | POA: Diagnosis not present

## 2021-10-12 ENCOUNTER — Other Ambulatory Visit: Payer: Medicare Other

## 2021-10-13 ENCOUNTER — Other Ambulatory Visit: Payer: Self-pay

## 2021-10-13 ENCOUNTER — Telehealth: Payer: Self-pay

## 2021-10-13 ENCOUNTER — Encounter: Payer: Self-pay | Admitting: *Deleted

## 2021-10-13 ENCOUNTER — Inpatient Hospital Stay: Payer: Medicare Other | Attending: Internal Medicine

## 2021-10-13 DIAGNOSIS — D472 Monoclonal gammopathy: Secondary | ICD-10-CM | POA: Diagnosis not present

## 2021-10-13 DIAGNOSIS — D509 Iron deficiency anemia, unspecified: Secondary | ICD-10-CM | POA: Diagnosis not present

## 2021-10-13 DIAGNOSIS — D631 Anemia in chronic kidney disease: Secondary | ICD-10-CM | POA: Diagnosis not present

## 2021-10-13 DIAGNOSIS — D688 Other specified coagulation defects: Secondary | ICD-10-CM | POA: Diagnosis not present

## 2021-10-13 DIAGNOSIS — N186 End stage renal disease: Secondary | ICD-10-CM | POA: Diagnosis not present

## 2021-10-13 DIAGNOSIS — N2581 Secondary hyperparathyroidism of renal origin: Secondary | ICD-10-CM | POA: Diagnosis not present

## 2021-10-13 DIAGNOSIS — Z992 Dependence on renal dialysis: Secondary | ICD-10-CM | POA: Diagnosis not present

## 2021-10-13 LAB — CBC WITH DIFFERENTIAL (CANCER CENTER ONLY)
Abs Immature Granulocytes: 0.02 10*3/uL (ref 0.00–0.07)
Basophils Absolute: 0 10*3/uL (ref 0.0–0.1)
Basophils Relative: 1 %
Eosinophils Absolute: 0.4 10*3/uL (ref 0.0–0.5)
Eosinophils Relative: 5 %
HCT: 32.5 % — ABNORMAL LOW (ref 39.0–52.0)
Hemoglobin: 10.8 g/dL — ABNORMAL LOW (ref 13.0–17.0)
Immature Granulocytes: 0 %
Lymphocytes Relative: 34 %
Lymphs Abs: 2.7 10*3/uL (ref 0.7–4.0)
MCH: 31.7 pg (ref 26.0–34.0)
MCHC: 33.2 g/dL (ref 30.0–36.0)
MCV: 95.3 fL (ref 80.0–100.0)
Monocytes Absolute: 0.8 10*3/uL (ref 0.1–1.0)
Monocytes Relative: 10 %
Neutro Abs: 4 10*3/uL (ref 1.7–7.7)
Neutrophils Relative %: 50 %
Platelet Count: 337 10*3/uL (ref 150–400)
RBC: 3.41 MIL/uL — ABNORMAL LOW (ref 4.22–5.81)
RDW: 17.9 % — ABNORMAL HIGH (ref 11.5–15.5)
WBC Count: 8 10*3/uL (ref 4.0–10.5)
nRBC: 0.6 % — ABNORMAL HIGH (ref 0.0–0.2)

## 2021-10-13 LAB — CMP (CANCER CENTER ONLY)
ALT: 13 U/L (ref 0–44)
AST: 12 U/L — ABNORMAL LOW (ref 15–41)
Albumin: 3.4 g/dL — ABNORMAL LOW (ref 3.5–5.0)
Alkaline Phosphatase: 92 U/L (ref 38–126)
Anion gap: 15 (ref 5–15)
BUN: 97 mg/dL — ABNORMAL HIGH (ref 8–23)
CO2: 22 mmol/L (ref 22–32)
Calcium: 9.4 mg/dL (ref 8.9–10.3)
Chloride: 102 mmol/L (ref 98–111)
Creatinine: 11.55 mg/dL (ref 0.61–1.24)
GFR, Estimated: 4 mL/min — ABNORMAL LOW (ref >60)
Glucose, Bld: 240 mg/dL — ABNORMAL HIGH (ref 70–99)
Potassium: 5.1 mmol/L (ref 3.5–5.1)
Sodium: 139 mmol/L (ref 135–145)
Total Bilirubin: 0.3 mg/dL (ref 0.3–1.2)
Total Protein: 7.2 g/dL (ref 6.5–8.1)

## 2021-10-13 NOTE — Progress Notes (Unsigned)
Critical Creatinine 11.55 given to Sherilyn Cooter, RN at 1248 pm 10/13/2021 by Dorian Furnace (MT)

## 2021-10-13 NOTE — Telephone Encounter (Signed)
Rosann Auerbach from [name] called reporting critical lab Scr+ 11.55 drawn today.  Pt a ESRD pt on dialysis.  Notified Dr. Burr Medico of the pt's critical lab value.

## 2021-10-14 ENCOUNTER — Ambulatory Visit: Payer: Medicare Other | Admitting: Hematology

## 2021-10-14 LAB — KAPPA/LAMBDA LIGHT CHAINS
Kappa free light chain: 409.1 mg/L — ABNORMAL HIGH (ref 3.3–19.4)
Kappa, lambda light chain ratio: 3.04 — ABNORMAL HIGH (ref 0.26–1.65)
Lambda free light chains: 134.7 mg/L — ABNORMAL HIGH (ref 5.7–26.3)

## 2021-10-15 DIAGNOSIS — D509 Iron deficiency anemia, unspecified: Secondary | ICD-10-CM | POA: Diagnosis not present

## 2021-10-15 DIAGNOSIS — Z992 Dependence on renal dialysis: Secondary | ICD-10-CM | POA: Diagnosis not present

## 2021-10-15 DIAGNOSIS — D472 Monoclonal gammopathy: Secondary | ICD-10-CM | POA: Diagnosis not present

## 2021-10-15 DIAGNOSIS — D631 Anemia in chronic kidney disease: Secondary | ICD-10-CM | POA: Diagnosis not present

## 2021-10-15 DIAGNOSIS — N2581 Secondary hyperparathyroidism of renal origin: Secondary | ICD-10-CM | POA: Diagnosis not present

## 2021-10-15 DIAGNOSIS — N186 End stage renal disease: Secondary | ICD-10-CM | POA: Diagnosis not present

## 2021-10-15 DIAGNOSIS — D688 Other specified coagulation defects: Secondary | ICD-10-CM | POA: Diagnosis not present

## 2021-10-16 LAB — MULTIPLE MYELOMA PANEL, SERUM
Albumin SerPl Elph-Mcnc: 3.4 g/dL (ref 2.9–4.4)
Albumin/Glob SerPl: 1 (ref 0.7–1.7)
Alpha 1: 0.2 g/dL (ref 0.0–0.4)
Alpha2 Glob SerPl Elph-Mcnc: 0.7 g/dL (ref 0.4–1.0)
B-Globulin SerPl Elph-Mcnc: 0.8 g/dL (ref 0.7–1.3)
Gamma Glob SerPl Elph-Mcnc: 1.7 g/dL (ref 0.4–1.8)
Globulin, Total: 3.5 g/dL (ref 2.2–3.9)
IgA: 177 mg/dL (ref 61–437)
IgG (Immunoglobin G), Serum: 1640 mg/dL — ABNORMAL HIGH (ref 603–1613)
IgM (Immunoglobulin M), Srm: 356 mg/dL — ABNORMAL HIGH (ref 20–172)
M Protein SerPl Elph-Mcnc: 0.7 g/dL — ABNORMAL HIGH
Total Protein ELP: 6.9 g/dL (ref 6.0–8.5)

## 2021-10-17 DIAGNOSIS — N2581 Secondary hyperparathyroidism of renal origin: Secondary | ICD-10-CM | POA: Diagnosis not present

## 2021-10-17 DIAGNOSIS — N186 End stage renal disease: Secondary | ICD-10-CM | POA: Diagnosis not present

## 2021-10-17 DIAGNOSIS — D688 Other specified coagulation defects: Secondary | ICD-10-CM | POA: Diagnosis not present

## 2021-10-17 DIAGNOSIS — Z992 Dependence on renal dialysis: Secondary | ICD-10-CM | POA: Diagnosis not present

## 2021-10-17 DIAGNOSIS — D509 Iron deficiency anemia, unspecified: Secondary | ICD-10-CM | POA: Diagnosis not present

## 2021-10-17 DIAGNOSIS — D472 Monoclonal gammopathy: Secondary | ICD-10-CM | POA: Diagnosis not present

## 2021-10-17 DIAGNOSIS — D631 Anemia in chronic kidney disease: Secondary | ICD-10-CM | POA: Diagnosis not present

## 2021-10-20 DIAGNOSIS — D631 Anemia in chronic kidney disease: Secondary | ICD-10-CM | POA: Diagnosis not present

## 2021-10-20 DIAGNOSIS — D472 Monoclonal gammopathy: Secondary | ICD-10-CM | POA: Diagnosis not present

## 2021-10-20 DIAGNOSIS — Z992 Dependence on renal dialysis: Secondary | ICD-10-CM | POA: Diagnosis not present

## 2021-10-20 DIAGNOSIS — N2581 Secondary hyperparathyroidism of renal origin: Secondary | ICD-10-CM | POA: Diagnosis not present

## 2021-10-20 DIAGNOSIS — N186 End stage renal disease: Secondary | ICD-10-CM | POA: Diagnosis not present

## 2021-10-20 DIAGNOSIS — D688 Other specified coagulation defects: Secondary | ICD-10-CM | POA: Diagnosis not present

## 2021-10-20 DIAGNOSIS — D509 Iron deficiency anemia, unspecified: Secondary | ICD-10-CM | POA: Diagnosis not present

## 2021-10-21 ENCOUNTER — Encounter: Payer: Self-pay | Admitting: Pharmacist

## 2021-10-21 ENCOUNTER — Other Ambulatory Visit: Payer: Self-pay

## 2021-10-21 ENCOUNTER — Ambulatory Visit: Payer: Medicare Other | Attending: Internal Medicine | Admitting: Pharmacist

## 2021-10-21 VITALS — BP 119/68 | HR 78 | Temp 98.6°F | Ht 74.0 in | Wt 262.4 lb

## 2021-10-21 DIAGNOSIS — Z0000 Encounter for general adult medical examination without abnormal findings: Secondary | ICD-10-CM

## 2021-10-21 DIAGNOSIS — Z89512 Acquired absence of left leg below knee: Secondary | ICD-10-CM | POA: Diagnosis not present
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
## 2021-10-22 ENCOUNTER — Encounter (HOSPITAL_COMMUNITY): Payer: Self-pay | Admitting: *Deleted

## 2021-10-22 ENCOUNTER — Encounter: Payer: Commercial Managed Care - HMO | Admitting: Internal Medicine

## 2021-10-24 DIAGNOSIS — D509 Iron deficiency anemia, unspecified: Secondary | ICD-10-CM | POA: Diagnosis not present

## 2021-10-24 DIAGNOSIS — D688 Other specified coagulation defects: Secondary | ICD-10-CM | POA: Diagnosis not present

## 2021-10-24 DIAGNOSIS — N2581 Secondary hyperparathyroidism of renal origin: Secondary | ICD-10-CM | POA: Diagnosis not present

## 2021-10-24 DIAGNOSIS — Z992 Dependence on renal dialysis: Secondary | ICD-10-CM | POA: Diagnosis not present

## 2021-10-24 DIAGNOSIS — D472 Monoclonal gammopathy: Secondary | ICD-10-CM | POA: Diagnosis not present

## 2021-10-24 DIAGNOSIS — N186 End stage renal disease: Secondary | ICD-10-CM | POA: Diagnosis not present

## 2021-10-24 DIAGNOSIS — D631 Anemia in chronic kidney disease: Secondary | ICD-10-CM | POA: Diagnosis not present

## 2021-10-26 ENCOUNTER — Encounter (HOSPITAL_COMMUNITY): Payer: Self-pay

## 2021-10-26 ENCOUNTER — Other Ambulatory Visit: Payer: Self-pay

## 2021-10-26 ENCOUNTER — Emergency Department (HOSPITAL_COMMUNITY)
Admission: EM | Admit: 2021-10-26 | Discharge: 2021-10-26 | Disposition: A | Discharge status: Home / Self Care | Payer: Medicare Other | Attending: Emergency Medicine | Admitting: Emergency Medicine

## 2021-10-26 DIAGNOSIS — L97519 Non-pressure chronic ulcer of other part of right foot with unspecified severity: Secondary | ICD-10-CM | POA: Diagnosis not present

## 2021-10-26 DIAGNOSIS — Z794 Long term (current) use of insulin: Secondary | ICD-10-CM | POA: Diagnosis not present

## 2021-10-26 DIAGNOSIS — E11621 Type 2 diabetes mellitus with foot ulcer: Secondary | ICD-10-CM | POA: Diagnosis not present

## 2021-10-26 DIAGNOSIS — E11628 Type 2 diabetes mellitus with other skin complications: Secondary | ICD-10-CM

## 2021-10-26 DIAGNOSIS — L03115 Cellulitis of right lower limb: Secondary | ICD-10-CM | POA: Diagnosis not present

## 2021-10-26 DIAGNOSIS — L089 Local infection of the skin and subcutaneous tissue, unspecified: Secondary | ICD-10-CM

## 2021-10-26 LAB — CBG MONITORING, ED: Glucose-Capillary: 125 mg/dL — ABNORMAL HIGH (ref 70–99)

## 2021-10-26 MED ORDER — CEPHALEXIN 500 MG PO CAPS: 500.0000 mg | ORAL_CAPSULE | Freq: Two times a day (BID) | ORAL | 0 refills | Status: AC

## 2021-10-26 NOTE — ED Triage Notes (Signed)
Pt c/o growing diabetic ulcer on right foot beginning about two weeks ago. Denies pain. States he is compliant with medication.

## 2021-10-26 NOTE — Discharge Instructions (Signed)
Follow-up with your primary care doctor this week.  Start antibiotics as prescribed.  Recommend bacitracin or Neosporin ointment to your foot twice daily.

## 2021-10-26 NOTE — ED Provider Notes (Signed)
[facility] DEPT Provider Note   CSN: [id number] Arrival date & time: 10/26/21  0940     History  Chief Complaint  Patient presents with   Foot Ulcer    Mark [name] is a 71 y.o. male.  Here for evaluation of wound on his right foot.  History of diabetes and high cholesterol.  States that he has noticed a wound there for about a week.  Denies any fevers or chills.  Denies any trauma.  Denies any numbness or tingling.  Nothing has made it worse or better.  The history is provided by the patient.  Illness Severity:  Mild Onset quality:  Gradual Duration:  1 week Chronicity:  New Associated symptoms: no abdominal pain, no chest pain, no congestion, no cough, no diarrhea, no ear pain, no fever, no headaches, no rash, no rhinorrhea, no shortness of breath and no sore throat       Home Medications Prior to Admission medications   Medication Sig Start Date End Date Taking? Authorizing Provider  cephALEXin (KEFLEX) 500 MG capsule Take 1 capsule (500 mg total) by mouth 2 (two) times daily for 7 days. 10/26/21 11/02/21 Yes Nastasia Kage, DO  acetaminophen (TYLENOL) 500 MG tablet Take 500 mg by mouth every 6 (six) hours as needed for moderate pain or headache.    [provider]  atorvastatin (LIPITOR) 20 MG tablet Take 1 tablet (20 mg total) by mouth daily. 02/05/21   [name], MD  Blood Glucose Monitoring Suppl (ONETOUCH VERIO) w/Device KIT Use as directed to test blood sugar three times daily. 03/23/19   [name], MD  carvedilol (COREG) 3.125 MG tablet Take 1 tablet (3.125 mg total) by mouth 2 (two) times daily. 09/02/21 09/02/22  Ghimire, Henreitta Leber, MD  cholecalciferol (VITAMIN D3) 25 MCG (1000 UNIT) tablet Take 1,000 Units by mouth daily.    [provider]  furosemide (LASIX) 80 MG tablet Take 1 tablet (80 mg total) by mouth daily. 09/21/21   [name], MD  glucose blood (ONETOUCH VERIO) test strip USE AS  DIRECTED THREE TIMES DAILY TO  TEST  BLOOD  SUGAR 03/15/21   [name], MD  insulin glargine (LANTUS SOLOSTAR) 100 UNIT/ML Solostar Pen Inject 20 Units into the skin at bedtime. 09/21/21   [name], MD  Insulin Pen Needle (TRUEPLUS 5-BEVEL PEN NEEDLES) 32G X 4 MM MISC Use to administer Lantus once daily. 02/19/21   [name], MD  Insulin Syringe-Needle U-100 (INSULIN SYRINGE .5CC/30GX5/16") 30G X 5/16" 0.5 ML MISC Check blood sugar TID & QHS 10/30/14   Advani, Vernon Prey, MD  lanthanum (FOSRENOL) 1000 MG chewable tablet Chew 1,000 mg by mouth as needed. Take 1 tablet with each meal & snack 07/27/21   [provider]  midodrine (PROAMATINE) 5 MG tablet Take 5 mg by mouth in the morning and at bedtime. Do not takes in bp is lower 130/80 04/04/21   [provider]  Multiple Vitamin (MULTIVITAMIN WITH MINERALS) TABS tablet Take 1 tablet by mouth daily.    [provider]  OneTouch Delica Lancets 76K MISC Use as directed to test blood sugar three times daily. 06/17/21   Charlott Rakes, MD      Allergies    Patient has no known allergies.    Review of Systems   Review of Systems  Constitutional:  Negative for fever.  HENT:  Negative for congestion, ear pain, rhinorrhea and sore throat.   Respiratory:  Negative for cough and shortness of breath.   Cardiovascular:  Negative for chest pain.  Gastrointestinal:  Negative for abdominal pain and diarrhea.  Skin:  Negative for rash.  Neurological:  Negative for headaches.   Physical Exam Updated Vital Signs BP 120/69 (BP Location: Right Arm)    Pulse 87    Temp 98.1 F (36.7 C) (Oral)    Resp 18    Ht _0  (1.88 m)    Wt 119 kg    SpO2 93%    BMI 33.69 kg/m  Physical Exam Constitutional:      Appearance: He is not ill-appearing.  HENT:     Head: Normocephalic.  Cardiovascular:     Pulses: Normal pulses.  Musculoskeletal:        General: No swelling. Normal range of motion.  Skin:    General: Skin  is warm.     Capillary Refill: Capillary refill takes less than 2 seconds.     Findings: No erythema.     Comments: Dried scab over the top of the right foot with no erythema  Neurological:     General: No focal deficit present.     Mental Status: He is alert.     Sensory: No sensory deficit.     Motor: No weakness.    ED Results / Procedures / Treatments   Labs (all labs ordered are listed, but only abnormal results are displayed) Labs Reviewed  CBG MONITORING, ED - Abnormal; Notable for the following components:      Result Value   Glucose-Capillary 125 (*)    All other components within normal limits    EKG None  Radiology No results found.  Procedures Procedures    Medications Ordered in ED Medications - No data to display  ED Course/ Medical Decision Making/ A&P                           Medical Decision Making  [name] is here with concern for foot infection.  History of diabetes.  Unremarkable vitals.  Blood sugar unremarkable.  He has a dried over scab on the top of his right foot.  No major surrounding erythema or purulence.  There is no swelling of the right foot or ankle.  Seems like may be a resolving infectious process but we will conservatively treat with antibiotics.  He has good pulses in the lower extremity.  I have no concern for peripheral arterial disease or occlusion.  No concern for DVT.  No concern for trauma.  Overall recommend bacitracin or Neosporin twice daily.  Will prescribe antibiotics conservatively and have him follow-up with primary care doctor.  Discharged in good condition.  This chart was dictated using voice recognition software.  Despite best efforts to proofread,  errors can occur which can change the documentation meaning.         Final Clinical Impression(s) / ED Diagnoses Final diagnoses:  Diabetic foot infection ([place])    Rx / DC Orders ED Discharge Orders          Ordered    cephALEXin (KEFLEX) 500 MG  capsule  2 times daily        10/26/21 [place], [place], DO 10/26/21 [phone]

## 2021-10-27 DIAGNOSIS — N2581 Secondary hyperparathyroidism of renal origin: Secondary | ICD-10-CM | POA: Diagnosis not present

## 2021-10-27 DIAGNOSIS — N186 End stage renal disease: Secondary | ICD-10-CM | POA: Diagnosis not present

## 2021-10-27 DIAGNOSIS — D472 Monoclonal gammopathy: Secondary | ICD-10-CM | POA: Diagnosis not present

## 2021-10-27 DIAGNOSIS — D509 Iron deficiency anemia, unspecified: Secondary | ICD-10-CM | POA: Diagnosis not present

## 2021-10-27 DIAGNOSIS — D631 Anemia in chronic kidney disease: Secondary | ICD-10-CM | POA: Diagnosis not present

## 2021-10-27 DIAGNOSIS — D688 Other specified coagulation defects: Secondary | ICD-10-CM | POA: Diagnosis not present

## 2021-10-27 DIAGNOSIS — Z992 Dependence on renal dialysis: Secondary | ICD-10-CM | POA: Diagnosis not present

## 2021-10-27 DIAGNOSIS — E1129 Type 2 diabetes mellitus with other diabetic kidney complication: Secondary | ICD-10-CM | POA: Diagnosis not present

## 2021-10-29 DIAGNOSIS — D509 Iron deficiency anemia, unspecified: Secondary | ICD-10-CM | POA: Diagnosis not present

## 2021-10-29 DIAGNOSIS — Z992 Dependence on renal dialysis: Secondary | ICD-10-CM | POA: Diagnosis not present

## 2021-10-29 DIAGNOSIS — D472 Monoclonal gammopathy: Secondary | ICD-10-CM | POA: Diagnosis not present

## 2021-10-29 DIAGNOSIS — N186 End stage renal disease: Secondary | ICD-10-CM | POA: Diagnosis not present

## 2021-10-29 DIAGNOSIS — D688 Other specified coagulation defects: Secondary | ICD-10-CM | POA: Diagnosis not present

## 2021-10-29 DIAGNOSIS — N2581 Secondary hyperparathyroidism of renal origin: Secondary | ICD-10-CM | POA: Diagnosis not present

## 2021-11-02 ENCOUNTER — Ambulatory Visit (INDEPENDENT_AMBULATORY_CARE_PROVIDER_SITE_OTHER): Payer: Medicaid Other | Admitting: Nurse Practitioner

## 2021-11-02 ENCOUNTER — Encounter: Payer: Self-pay | Admitting: Nurse Practitioner

## 2021-11-02 ENCOUNTER — Other Ambulatory Visit: Payer: Self-pay

## 2021-11-02 VITALS — BP 129/70 | HR 81

## 2021-11-02 DIAGNOSIS — I5032 Chronic diastolic (congestive) heart failure: Secondary | ICD-10-CM | POA: Diagnosis not present

## 2021-11-02 DIAGNOSIS — S80811A Abrasion, right lower leg, initial encounter: Secondary | ICD-10-CM

## 2021-11-02 DIAGNOSIS — S91301A Unspecified open wound, right foot, initial encounter: Secondary | ICD-10-CM | POA: Diagnosis not present

## 2021-11-02 MED ORDER — MUPIROCIN 2 % EX OINT: 1.0000 "application " | TOPICAL_OINTMENT | Freq: Two times a day (BID) | CUTANEOUS | 0 refills | Status: DC

## 2021-11-02 MED ORDER — FUROSEMIDE 80 MG PO TABS: 80.0000 mg | ORAL_TABLET | Freq: Every day | ORAL | 3 refills | Status: DC

## 2021-11-02 NOTE — Progress Notes (Signed)
? ?@Patient  ID: Mark [name], male    DOB: Jan 23, 1951, 71 y.o.   MRN: [id number] ? ?Chief Complaint  ?Patient presents with  ? Hospitalization Follow-up  ? ? ?Referring provider: ?[name], MD ? ?HPI ? ?Patient presents today for hospital follow-up.  He was recently seen in the ED for a wound to his right foot.  The area appears to be healing well.  He does have a new abrasion to his right shin.  He is applying Neosporin and nonstick dressing to the area.  It does not appear infected.  Patient did complete a round of antibiotics after discharge from the hospital.  Patient does have a follow-up with his foot doctor/wound care this Friday.  Patient states that he needs a refill on his Lasix today.  Overall he states that he has been doing well since ED discharge. Denies f/c/s, n/v/d, hemoptysis, PND, chest pain or edema. ? ? ? ?No Known Allergies ? ?Immunization History  ?Administered Date(s) Administered  ? Influenza, High Dose Seasonal PF 06/09/2021  ? Influenza,inj,Quad PF,6+ Mos 10/10/2013, 10/17/2014, 08/15/2015, 04/15/2016, 05/12/2017, 06/29/2018, 06/22/2019  ? PFIZER(Purple Top)SARS-COV-2 Vaccination 10/06/2019, 12/25/2019  ? Pneumococcal Conjugate-13 02/08/2017, 12/18/2020  ? Pneumococcal Polysaccharide-23 10/10/2013, 01/07/2016, 02/03/2021  ? Tdap 04/14/2016  ? Zoster Recombinat (Shingrix) 09/23/2021  ? ? ?Past Medical History:  ?Diagnosis Date  ? Acid indigestion   ? Acute encephalopathy 01/01/2016  ? Acute renal failure superimposed on stage 3 chronic kidney disease ([place]) 04/16/2015  ? Anemia 10/01/2013  ? Arthritis   ? Bursitis   ? CHF (congestive heart failure) ([place])   ? Chronic kidney disease   ? dialysis T-Th-Sat  ? CKD (chronic kidney disease) stage 3, GFR 30-59 ml/min (HCC) 08/18/2015  ? Diabetes mellitus, type 2 ([place]) 04/16/2015  ? Diarrhea   ? chronic  ? Diverticulitis   ? DM (diabetes mellitus), type 2 with peripheral vascular complications ([place])   ? right  leg  ? Elevated troponin  10/16/2014  ? Essential hypertension 04/08/2014  ? History of Clostridium difficile colitis 01/01/2016  ? History of kidney stones   ? passed x 2  ? Hypertension   ? no pcp  ? Hypothermia 01/01/2016  ? Malnutrition of moderate degree ([place]) 04/17/2015  ? Multiple myeloma ([place])   ? Onychomycosis of toenail 04/30/2015  ? Phantom limb pain ([place]) 12/12/2013  ? left bka  ? Pneumonia 2020  ? in hosp 08/2021  ? Positive for microalbuminuria 08/18/2015  ? S/P BKA (below knee amputation) ([place]) 11/21/2013  ? 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    ? Seizure ([place]) 2015  ? 2015- "using drugs"  had seizure as a child , none after age 74- did not know what caused the seizures  ? Seizures ([place])   ? Spleen absent   ? Substance abuse ([place]) 04/02/2016  ? Cocaine  ? Wound infection 01/02/2016  ? ? ?Tobacco History: ?Social History  ? ?Tobacco Use  ?Smoking Status Former  ? Packs/day: 1.00  ? Years: 10.00  ? Pack years: 10.00  ? Types: Cigarettes  ? Quit date: 08/03/2013  ? Years since quitting: 8.2  ?Smokeless Tobacco Never  ? ?Counseling given: Not Answered ? ? ?Outpatient Encounter Medications as of 11/02/2021  ?Medication Sig  ? acetaminophen (TYLENOL) 500 MG tablet Take 500 mg by mouth every 6 (six) hours as needed for moderate pain or headache.  ? atorvastatin (LIPITOR) 20 MG tablet Take 1 tablet (20 mg total) by mouth daily.  ? Blood  Glucose Monitoring Suppl (ONETOUCH VERIO) w/Device KIT Use as directed to test blood sugar three times daily.  ? carvedilol (COREG) 3.125 MG tablet Take 1 tablet (3.125 mg total) by mouth 2 (two) times daily.  ? [EXPIRED] cephALEXin (KEFLEX) 500 MG capsule Take 1 capsule (500 mg total) by mouth 2 (two) times daily for 7 days.  ? cholecalciferol (VITAMIN D3) 25 MCG (1000 UNIT) tablet Take 1,000 Units by mouth daily.  ? glucose blood (ONETOUCH VERIO) test strip USE AS DIRECTED THREE TIMES DAILY TO  TEST  BLOOD  SUGAR  ? insulin glargine (LANTUS SOLOSTAR) 100 UNIT/ML Solostar Pen Inject 20 Units into the skin at  bedtime.  ? Insulin Pen Needle (TRUEPLUS 5-BEVEL PEN NEEDLES) 32G X 4 MM MISC Use to administer Lantus once daily.  ? Insulin Syringe-Needle U-100 (INSULIN SYRINGE .5CC/30GX5/16") 30G X 5/16" 0.5 ML MISC Check blood sugar TID & QHS  ? lanthanum (FOSRENOL) 1000 MG chewable tablet Chew 1,000 mg by mouth as needed. Take 1 tablet with each meal & snack  ? midodrine (PROAMATINE) 5 MG tablet Take 5 mg by mouth in the morning and at bedtime. Do not takes in bp is lower 130/80  ? Multiple Vitamin (MULTIVITAMIN WITH MINERALS) TABS tablet Take 1 tablet by mouth daily.  ? mupirocin ointment (BACTROBAN) 2 % Apply 1 application topically 2 (two) times daily.  ? OneTouch Delica Lancets 03J MISC Use as directed to test blood sugar three times daily.  ? [DISCONTINUED] furosemide (LASIX) 80 MG tablet Take 1 tablet (80 mg total) by mouth daily.  ? furosemide (LASIX) 80 MG tablet Take 1 tablet (80 mg total) by mouth daily.  ? ?No facility-administered encounter medications on file as of 11/02/2021.  ? ? ? ?Review of Systems ? ?Review of Systems  ?Constitutional: Negative.   ?HENT: Negative.    ?Cardiovascular: Negative.   ?Gastrointestinal: Negative.   ?Skin:  Positive for wound (healing area to the top of left foot, skin abrasion to left shin).  ?Allergic/Immunologic: Negative.   ?Neurological: Negative.   ?Psychiatric/Behavioral: Negative.     ? ? ? ?Physical Exam ? ?BP 129/70   Pulse 81   SpO2 92%  ? ?Wt Readings from Last 5 Encounters:  ?10/26/21 262 lb 6.4 oz (119 kg)  ?10/21/21 262 lb 6.4 oz (119 kg)  ?09/16/21 253 lb (114.8 kg)  ?09/02/21 242 lb 11.6 oz (110.1 kg)  ?06/12/21 191 lb (86.6 kg)  ? ? ? ?Physical Exam ?Vitals and nursing note reviewed.  ?Constitutional:   ?   General: He is not in acute distress. ?   Appearance: He is well-developed.  ?Cardiovascular:  ?   Rate and Rhythm: Normal rate and regular rhythm.  ?Pulmonary:  ?   Effort: Pulmonary effort is normal.  ?   Breath sounds: Normal breath sounds.  ?Skin: ?    General: Skin is warm and dry.  ? ?    ?   Comments: Area to te top of left foot appears well healing, skin abrasion noted to left shin with some bloody drainage. No signs of infection at this time.  ?Neurological:  ?   Mental Status: He is alert and oriented to person, place, and time.  ? ? ? ?Lab Results: ? ?CBC ?   ?Component Value Date/Time  ? WBC 8.0 10/13/2021 1012  ? WBC 8.0 09/01/2021 0130  ? RBC 3.41 (L) 10/13/2021 1012  ? HGB 10.8 (L) 10/13/2021 1012  ? HGB 7.9 (L) 10/18/2017 1424  ? HCT  32.5 (L) 10/13/2021 1012  ? HCT 25.4 (L) 10/18/2017 1424  ? PLT 337 10/13/2021 1012  ? PLT 356 10/18/2017 1424  ? MCV 95.3 10/13/2021 1012  ? MCV 92 10/18/2017 1424  ? MCH 31.7 10/13/2021 1012  ? MCHC 33.2 10/13/2021 1012  ? RDW 17.9 (H) 10/13/2021 1012  ? RDW 16.4 (H) 10/18/2017 1424  ? LYMPHSABS 2.7 10/13/2021 1012  ? LYMPHSABS 3.9 (H) 10/18/2017 1424  ? MONOABS 0.8 10/13/2021 1012  ? EOSABS 0.4 10/13/2021 1012  ? EOSABS 0.5 (H) 10/18/2017 1424  ? BASOSABS 0.0 10/13/2021 1012  ? BASOSABS 0.0 10/18/2017 1424  ? ? ?BMET ?   ?Component Value Date/Time  ? NA 139 10/13/2021 1012  ? NA 145 (H) 10/18/2017 1424  ? K 5.1 10/13/2021 1012  ? CL 102 10/13/2021 1012  ? CO2 22 10/13/2021 1012  ? GLUCOSE 240 (H) 10/13/2021 1012  ? BUN 97 (H) 10/13/2021 1012  ? BUN 54 (H) 10/18/2017 1424  ? CREATININE 11.55 (HH) 10/13/2021 1012  ? CREATININE 2.16 (H) 11/16/2016 1148  ? CALCIUM 9.4 10/13/2021 1012  ? GFRNONAA 4 (L) 10/13/2021 1012  ? GFRNONAA 31 (L) 11/16/2016 1148  ? GFRAA 8 (L) 05/30/2020 1111  ? GFRAA 7 (L) 02/18/2020 1357  ? GFRAA 36 (L) 11/16/2016 1148  ? ? ?BNP ?   ?Component Value Date/Time  ? BNP 496.3 (H) 01/02/2016 0430  ? ? ?ProBNP ?No results found for: PROBNP ? ?Imaging: ?No results found. ? ? ?Assessment & Plan:  ? ?Abrasion, right lower leg, initial encounter ?Foot ulcer right foot: ? ?Wash wounds with dial anti-bacterial soap - rinse well - pat dry ? ?May apply Bactroban to affected areas ? ? ? ?Follow up: ? ?Follow up  with Dr. Wynetta Emery as scheduled ? ?Keep appointment with wound care this friday ? ? ? ? ?Fenton Foy, NP ?11/03/2021 ? ?

## 2021-11-02 NOTE — Patient Instructions (Addendum)
Abrasion right shin ?Foot ulcer right foot: ? ?Wash wounds with dial anti-bacterial soap - rinse well - pat dry ? ?May apply Bactroban to affected areas ? ? ? ?Follow up: ? ?Follow up with Dr. Wynetta Emery as scheduled ? ?Keep appointment with wound care this friday ? ?Wound Care, Adult ?Taking care of your wound properly can help to prevent pain, infection, and scarring. It can also help your wound heal more quickly. Follow instructions from your health care provider about how to care for your wound. ?Supplies needed: ?Soap and water. ?Wound cleanser, saline, or germ-free (sterile) water. ?Gauze. ?If needed, a clean bandage (dressing) or other type of wound dressing material to cover or place in the wound. Follow your health care provider's instructions about what dressing supplies to use. ?Cream or topical ointment to apply to the wound, if told by your health care provider. ?How to care for your wound ?Cleaning the wound ?Ask your health care provider how to clean the wound. This may include: ?Using mild soap and water, a wound cleanser, saline, or sterile water. ?Using a clean gauze to pat the wound dry after cleaning it. Do not rub or scrub the wound. ?Dressing care ?Wash your hands with soap and water for at least 20 seconds before and after you change the dressing. If soap and water are not available, use hand sanitizer. ?Change your dressing as told by your health care provider. This may include: ?Cleaning or rinsing out (irrigating) the wound. ?Application of cream or topical ointment, if told by your health care provider. ?Placing a dressing over the wound or in the wound (packing). ?Covering the wound with an outer dressing. ?Leave stitches (sutures), staples, skin glue, or adhesive strips in place. These skin closures may need to stay in place for 2 weeks or longer. If adhesive strip edges start to loosen and curl up, you may trim the loose edges. Do not remove adhesive strips completely unless your health  care provider tells you to do that. ?Ask your health care provider when you can leave the wound uncovered. ?Checking for infection ?Check your wound area every day for signs of infection. Check for: ?More redness, swelling, or pain. ?Fluid or blood. ?Warmth. ?Pus or a bad smell. ? ?Follow these instructions at home ?Medicines ?If you were prescribed an antibiotic medicine, cream, or ointment, take or apply it as told by your health care provider. Do not stop using the antibiotic even if your condition improves. ?If you were prescribed pain medicine, take it 30 minutes before you do any wound care or as told by your health care provider. ?Take over-the-counter and prescription medicines only as told by your health care provider. ?Eating and drinking ?Eat a diet that includes protein, vitamin A, vitamin C, and other nutrient-rich foods to help the wound heal. ?Foods rich in protein include meat, fish, eggs, dairy, beans, and nuts. ?Foods rich in vitamin A include carrots and dark green, leafy vegetables. ?Foods rich in vitamin C include citrus fruits, tomatoes, broccoli, and peppers. ?Drink enough fluid to keep your urine pale yellow. ?General instructions ?Do not take baths, swim, or use a hot tub until your health care provider approves. Ask your health care provider if you may take showers. You may only be allowed to take sponge baths. ?Do not scratch or pick at the wound. Keep it covered as told by your health care provider. ?Return to your normal activities as told by your health care provider. Ask your health care provider what  activities are safe for you. ?Protect your wound from the sun when you are outside for the first 6 months, or for as long as told by your health care provider. Cover up the scar area or apply sunscreen that has an SPF of at least 69. ?Do not use any products that contain nicotine or tobacco. These products include cigarettes, chewing tobacco, and vaping devices, such as e-cigarettes. If  you need help quitting, ask your health care provider. ?Keep all follow-up visits. This is important. ?Contact a health care provider if: ?You received a tetanus shot and you have swelling, severe pain, redness, or bleeding at the injection site. ?Your pain is not controlled with medicine. ?You have any of these signs of infection: ?More redness, swelling, or pain around the wound. ?Fluid or blood coming from the wound. ?Warmth coming from the wound. ?A fever or chills. ?You are nauseous or you vomit. ?You are dizzy. ?You have a new rash or hardness around the wound. ?Get help right away if: ?You have a red streak of skin near the area around your wound. ?Pus or a bad smell coming from the wound. ?Your wound has been closed with staples, sutures, skin glue, or adhesive strips and it begins to open up and separate. ?Your wound is bleeding, and the bleeding does not stop with gentle pressure. ?These symptoms may represent a serious problem that is an emergency. Do not wait to see if the symptoms will go away. Get medical help right away. Call your local emergency services (911 in the [place] Do not drive yourself to the hospital. ?Summary ?Always wash your hands with soap and water for at least 20 seconds before and after changing your dressing. ?Change your dressing as told by your health care provider. ?To help with healing, eat foods that are rich in protein, vitamin A, vitamin C, and other nutrients. ?Check your wound every day for signs of infection. Contact your health care provider if you think that your wound is infected. ?This information is not intended to replace advice given to you by your health care provider. Make sure you discuss any questions you have with your health care provider. ?Document Revised: 12/23/2020 Document Reviewed: 12/23/2020 ?Elsevier Patient Education ? 2022 [place]. ? ? ? ?

## 2021-11-03 ENCOUNTER — Encounter: Payer: Self-pay | Admitting: Nurse Practitioner

## 2021-11-03 ENCOUNTER — Inpatient Hospital Stay: Payer: Medicare Other | Admitting: Nurse Practitioner

## 2021-11-03 DIAGNOSIS — D509 Iron deficiency anemia, unspecified: Secondary | ICD-10-CM | POA: Diagnosis not present

## 2021-11-03 DIAGNOSIS — N2581 Secondary hyperparathyroidism of renal origin: Secondary | ICD-10-CM | POA: Diagnosis not present

## 2021-11-03 DIAGNOSIS — S80811A Abrasion, right lower leg, initial encounter: Secondary | ICD-10-CM | POA: Insufficient documentation

## 2021-11-03 DIAGNOSIS — Z992 Dependence on renal dialysis: Secondary | ICD-10-CM | POA: Diagnosis not present

## 2021-11-03 DIAGNOSIS — D472 Monoclonal gammopathy: Secondary | ICD-10-CM | POA: Diagnosis not present

## 2021-11-03 DIAGNOSIS — N186 End stage renal disease: Secondary | ICD-10-CM | POA: Diagnosis not present

## 2021-11-03 DIAGNOSIS — D688 Other specified coagulation defects: Secondary | ICD-10-CM | POA: Diagnosis not present

## 2021-11-03 NOTE — Assessment & Plan Note (Signed)
Foot ulcer right foot: ? ?Wash wounds with dial anti-bacterial soap - rinse well - pat dry ? ?May apply Bactroban to affected areas ? ? ? ?Follow up: ? ?Follow up with Dr. Wynetta Emery as scheduled ? ?Keep appointment with wound care this friday ?

## 2021-11-04 ENCOUNTER — Inpatient Hospital Stay: Payer: Medicare Other | Admitting: Nurse Practitioner

## 2021-11-05 DIAGNOSIS — N186 End stage renal disease: Secondary | ICD-10-CM | POA: Diagnosis not present

## 2021-11-05 DIAGNOSIS — D688 Other specified coagulation defects: Secondary | ICD-10-CM | POA: Diagnosis not present

## 2021-11-05 DIAGNOSIS — D472 Monoclonal gammopathy: Secondary | ICD-10-CM | POA: Diagnosis not present

## 2021-11-05 DIAGNOSIS — D509 Iron deficiency anemia, unspecified: Secondary | ICD-10-CM | POA: Diagnosis not present

## 2021-11-05 DIAGNOSIS — N2581 Secondary hyperparathyroidism of renal origin: Secondary | ICD-10-CM | POA: Diagnosis not present

## 2021-11-05 DIAGNOSIS — Z992 Dependence on renal dialysis: Secondary | ICD-10-CM | POA: Diagnosis not present

## 2021-11-06 DIAGNOSIS — E1151 Type 2 diabetes mellitus with diabetic peripheral angiopathy without gangrene: Secondary | ICD-10-CM | POA: Diagnosis not present

## 2021-11-06 DIAGNOSIS — E1351 Other specified diabetes mellitus with diabetic peripheral angiopathy without gangrene: Secondary | ICD-10-CM | POA: Diagnosis not present

## 2021-11-06 DIAGNOSIS — S92491A Other fracture of right great toe, initial encounter for closed fracture: Secondary | ICD-10-CM | POA: Diagnosis not present

## 2021-11-06 DIAGNOSIS — E1142 Type 2 diabetes mellitus with diabetic polyneuropathy: Secondary | ICD-10-CM | POA: Diagnosis not present

## 2021-11-06 DIAGNOSIS — B351 Tinea unguium: Secondary | ICD-10-CM | POA: Diagnosis not present

## 2021-11-06 DIAGNOSIS — L603 Nail dystrophy: Secondary | ICD-10-CM | POA: Diagnosis not present

## 2021-11-07 DIAGNOSIS — N2581 Secondary hyperparathyroidism of renal origin: Secondary | ICD-10-CM | POA: Diagnosis not present

## 2021-11-07 DIAGNOSIS — D688 Other specified coagulation defects: Secondary | ICD-10-CM | POA: Diagnosis not present

## 2021-11-07 DIAGNOSIS — D509 Iron deficiency anemia, unspecified: Secondary | ICD-10-CM | POA: Diagnosis not present

## 2021-11-07 DIAGNOSIS — D472 Monoclonal gammopathy: Secondary | ICD-10-CM | POA: Diagnosis not present

## 2021-11-07 DIAGNOSIS — N186 End stage renal disease: Secondary | ICD-10-CM | POA: Diagnosis not present

## 2021-11-07 DIAGNOSIS — Z992 Dependence on renal dialysis: Secondary | ICD-10-CM | POA: Diagnosis not present

## 2021-11-10 DIAGNOSIS — D472 Monoclonal gammopathy: Secondary | ICD-10-CM | POA: Diagnosis not present

## 2021-11-10 DIAGNOSIS — N2581 Secondary hyperparathyroidism of renal origin: Secondary | ICD-10-CM | POA: Diagnosis not present

## 2021-11-10 DIAGNOSIS — D509 Iron deficiency anemia, unspecified: Secondary | ICD-10-CM | POA: Diagnosis not present

## 2021-11-10 DIAGNOSIS — D688 Other specified coagulation defects: Secondary | ICD-10-CM | POA: Diagnosis not present

## 2021-11-10 DIAGNOSIS — Z992 Dependence on renal dialysis: Secondary | ICD-10-CM | POA: Diagnosis not present

## 2021-11-10 DIAGNOSIS — N186 End stage renal disease: Secondary | ICD-10-CM | POA: Diagnosis not present

## 2021-11-12 ENCOUNTER — Ambulatory Visit: Payer: Medicare Other | Attending: Physician Assistant | Admitting: Physician Assistant

## 2021-11-12 ENCOUNTER — Other Ambulatory Visit: Payer: Self-pay

## 2021-11-12 ENCOUNTER — Ambulatory Visit: Payer: Self-pay | Admitting: *Deleted

## 2021-11-12 DIAGNOSIS — R509 Fever, unspecified: Secondary | ICD-10-CM | POA: Diagnosis not present

## 2021-11-12 NOTE — Telephone Encounter (Signed)
Pt is schedule for a virtual appt with [facility] for 3/16 ?

## 2021-11-12 NOTE — Telephone Encounter (Signed)
?Chief Complaint: requesting appt feels like flu sx.  ?Symptoms: headache, sore throat, cough congestion coughing up yellow sputum at times. Feels like fever. No thermometer to check. BP 158/79 . Hx diabetic, dialysis patient  ?Frequency: 2 weeks  ?Pertinent Negatives: Patient denies chest pain , difficulty breathing, no shortness of breath.  ?Disposition: '[]'$ ED /'[]'$ Urgent Care (no appt availability in office) / '[x]'$ Appointment(In office/virtual)/ '[]'$  [place]/ '[]'$ Home Care/ '[]'$ Refused Recommended Disposition /'[]'$ [facility]/ '[]'$  Follow-up with PCP ?Additional Notes:  ? ?Recommended if symptoms worsen go to [facility] / ED. Unable to recommend pushing fluids due to patient is dialysis patient  ? ? Reason for Disposition ? [1] HIGH RISK for severe COVID complications (e.g., weak immune system, age > 75 years, obesity with BMI > 25, pregnant, chronic lung disease or other chronic medical condition) AND [2] COVID symptoms (e.g., cough, fever)  (Exceptions: Already seen by PCP and no new or worsening symptoms.) ? ?Answer Assessment - Initial Assessment Questions ?1. COVID-19 DIAGNOSIS: "Who made your COVID-19 diagnosis?" "Was it confirmed by a positive lab test or self-test?" If not diagnosed by a doctor (or NP/PA), ask "Are there lots of cases (community spread) where you live?" Note: See public health department website, if unsure. ?    Has not been tested  ?2. COVID-19 EXPOSURE: "Was there any known exposure to COVID before the symptoms began?" CDC Definition of close contact: within 6 feet (2 meters) for a total of 15 minutes or more over a 24-hour period.  ?    no ?3. ONSET: "When did the COVID-19 symptoms start?"  ?    2 weeks ago after DTA dropped patient off at appt and he had to stand in the rain to wait for his appt.  Pick up was an hour early. ?4. WORST SYMPTOM: "What is your worst symptom?" (e.g., cough, fever, shortness of breath, muscle aches) ?    Headache, cough, sore throat , , congestion -  yellow sputum at times  ?5. COUGH: "Do you have a cough?" If Yes, ask: "How bad is the cough?"   ?    Coughing spells bad at times  ?6. FEVER: "Do you have a fever?" If Yes, ask: "What is your temperature, how was it measured, and when did it start?" ?    Not sure , feels like fever ?7. RESPIRATORY STATUS: "Describe your breathing?" (e.g., shortness of breath, wheezing, unable to speak)  ?    Denies breathing issues  ?8. BETTER-SAME-WORSE: "Are you getting better, staying the same or getting worse compared to yesterday?"  If getting worse, ask, "In what way?" ?    Getting worse ?9. HIGH RISK DISEASE: "Do you have any chronic medical problems?" (e.g., asthma, heart or lung disease, weak immune system, obesity, etc.) ?    Diabetic , dialysis patient  ?10. VACCINE: "Have you had the COVID-19 vaccine?" If Yes, ask: "Which one, how many shots, when did you get it?" ?      X 2 Moderna  ?11. BOOSTER: "Have you received your COVID-19 booster?" If Yes, ask: "Which one and when did you get it?" ?      X 1 Moderna  ?12. PREGNANCY: "Is there any chance you are pregnant?" "When was your last menstrual period?" ?      na ?13. OTHER SYMPTOMS: "Do you have any other symptoms?"  (e.g., chills, fatigue, headache, loss of smell or taste, muscle pain, sore throat) ?      See above  ?  14. O2 SATURATION MONITOR:  "Do you use an oxygen saturation monitor (pulse oximeter) at home?" If Yes, ask "What is your reading (oxygen level) today?" "What is your usual oxygen saturation reading?" (e.g., 95%) ?      na ? ?Protocols used: Coronavirus ([id number]-74) Diagnosed or Suspected-A-AH ? ?

## 2021-11-12 NOTE — Telephone Encounter (Signed)
Summary: cold-like symptoms  ? Pt called in for assistance.  Pt says that he feel like he has the flu. Pt says that he has a headache, cough and congestion. Pt would like an appt to be seen sooner that office have available.  ? ? ? ?Please assist   ?  ? ? ?Called patient to review flu like sx. No answer, LVMTCB [phone].  ?

## 2021-11-12 NOTE — Progress Notes (Signed)
Patient ID: CALE BETHARD, male   DOB: Sep 09, 1950, 71 y.o.   MRN: [id number] ?Virtual Visit via Telephone Note ? ?I connected with [name] on 11/12/21 at  3:50 PM EDT by telephone and verified that I am speaking with the correct person using two identifiers. ? ?Location: ?Patient: home ?Provider: [facility] office ?  ?I discussed the limitations, risks, security and privacy concerns of performing an evaluation and management service by telephone and the availability of in person appointments. I also discussed with the patient that there may be a patient responsible charge related to this service. The patient expressed understanding and agreed to proceed. ? ? ?History of Present Illness:  fever last night and feeling acutely ill.  Recent hospitalization.  On dialysis.  Coughing and congestion.  Does not feel well at all ? ?  ?Observations/Objective:  NAD.  sounds congested.   ? ? ?Assessment and Plan: ?1. Fever, unspecified fever cause ?To ED for acute work-up/eval.  He does have a ride and will go there now due to multiple risk factors and comorbidities.   ? ? ? ?Follow Up Instructions: ?See PCP as next scheduled ?  ?I discussed the assessment and treatment plan with the patient. The patient was provided an opportunity to ask questions and all were answered. The patient agreed with the plan and demonstrated an understanding of the instructions. ?  ?The patient was advised to call back or seek an in-person evaluation if the symptoms worsen or if the condition fails to improve as anticipated. ? ?I provided 9 minutes of non-face-to-face time during this encounter. ? ? ?Freeman Caldron, PA-C ? ? ?

## 2021-11-13 ENCOUNTER — Emergency Department (HOSPITAL_COMMUNITY)
Admission: EM | Admit: 2021-11-13 | Discharge: 2021-11-13 | Disposition: A | Discharge status: Home / Self Care | Payer: Medicare Other | Attending: Emergency Medicine | Admitting: Emergency Medicine

## 2021-11-13 ENCOUNTER — Encounter (HOSPITAL_COMMUNITY): Payer: Self-pay | Admitting: Emergency Medicine

## 2021-11-13 ENCOUNTER — Emergency Department (HOSPITAL_COMMUNITY): Payer: Medicare Other

## 2021-11-13 DIAGNOSIS — R0602 Shortness of breath: Secondary | ICD-10-CM | POA: Diagnosis not present

## 2021-11-13 DIAGNOSIS — J069 Acute upper respiratory infection, unspecified: Secondary | ICD-10-CM

## 2021-11-13 DIAGNOSIS — Z992 Dependence on renal dialysis: Secondary | ICD-10-CM | POA: Diagnosis not present

## 2021-11-13 DIAGNOSIS — D72829 Elevated white blood cell count, unspecified: Secondary | ICD-10-CM | POA: Insufficient documentation

## 2021-11-13 DIAGNOSIS — N186 End stage renal disease: Secondary | ICD-10-CM | POA: Diagnosis not present

## 2021-11-13 DIAGNOSIS — J90 Pleural effusion, not elsewhere classified: Secondary | ICD-10-CM | POA: Diagnosis not present

## 2021-11-13 DIAGNOSIS — R059 Cough, unspecified: Secondary | ICD-10-CM | POA: Diagnosis present

## 2021-11-13 DIAGNOSIS — U071 COVID-19: Secondary | ICD-10-CM | POA: Insufficient documentation

## 2021-11-13 DIAGNOSIS — R0989 Other specified symptoms and signs involving the circulatory and respiratory systems: Secondary | ICD-10-CM | POA: Diagnosis not present

## 2021-11-13 LAB — CBC WITH DIFFERENTIAL/PLATELET
Abs Immature Granulocytes: 0.04 10*3/uL (ref 0.00–0.07)
Basophils Absolute: 0.1 10*3/uL (ref 0.0–0.1)
Basophils Relative: 1 %
Eosinophils Absolute: 0.4 10*3/uL (ref 0.0–0.5)
Eosinophils Relative: 3 %
HCT: 33.1 % — ABNORMAL LOW (ref 39.0–52.0)
Hemoglobin: 10.8 g/dL — ABNORMAL LOW (ref 13.0–17.0)
Immature Granulocytes: 0 %
Lymphocytes Relative: 32 %
Lymphs Abs: 3.4 10*3/uL (ref 0.7–4.0)
MCH: 31.4 pg (ref 26.0–34.0)
MCHC: 32.6 g/dL (ref 30.0–36.0)
MCV: 96.2 fL (ref 80.0–100.0)
Monocytes Absolute: 1.5 10*3/uL — ABNORMAL HIGH (ref 0.1–1.0)
Monocytes Relative: 14 %
Neutro Abs: 5.3 10*3/uL (ref 1.7–7.7)
Neutrophils Relative %: 50 %
Platelets: 359 10*3/uL (ref 150–400)
RBC: 3.44 MIL/uL — ABNORMAL LOW (ref 4.22–5.81)
RDW: 16.4 % — ABNORMAL HIGH (ref 11.5–15.5)
WBC: 10.7 10*3/uL — ABNORMAL HIGH (ref 4.0–10.5)
nRBC: 0 % (ref 0.0–0.2)

## 2021-11-13 LAB — BASIC METABOLIC PANEL
Anion gap: 15 (ref 5–15)
BUN: 92 mg/dL — ABNORMAL HIGH (ref 8–23)
CO2: 20 mmol/L — ABNORMAL LOW (ref 22–32)
Calcium: 8.9 mg/dL (ref 8.9–10.3)
Chloride: 103 mmol/L (ref 98–111)
Creatinine, Ser: 12.73 mg/dL — ABNORMAL HIGH (ref 0.61–1.24)
GFR, Estimated: 4 mL/min — ABNORMAL LOW (ref >60)
Glucose, Bld: 135 mg/dL — ABNORMAL HIGH (ref 70–99)
Potassium: 4.5 mmol/L (ref 3.5–5.1)
Sodium: 138 mmol/L (ref 135–145)

## 2021-11-13 LAB — RESP PANEL BY RT-PCR (FLU A&B, COVID) ARPGX2
Influenza A by PCR: NEGATIVE
Influenza B by PCR: NEGATIVE
SARS Coronavirus 2 by RT PCR: POSITIVE — AB

## 2021-11-13 MED ORDER — BENZONATATE 100 MG PO CAPS
100.0000 mg | ORAL_CAPSULE | Freq: Once | ORAL | Status: AC
  Administered 2021-11-13: 100 mg via ORAL
  Filled 2021-11-13: qty 1

## 2021-11-13 MED ORDER — ONDANSETRON 4 MG PO TBDP
4.0000 mg | ORAL_TABLET | Freq: Once | ORAL | Status: AC
  Administered 2021-11-13: 4 mg via ORAL
  Filled 2021-11-13: qty 1

## 2021-11-13 MED ORDER — ONDANSETRON 4 MG PO TBDP: ORAL_TABLET | ORAL | 0 refills | Status: DC

## 2021-11-13 MED ORDER — BENZONATATE 100 MG PO CAPS: 100.0000 mg | ORAL_CAPSULE | Freq: Three times a day (TID) | ORAL | 0 refills | Status: DC

## 2021-11-13 NOTE — ED Provider Notes (Signed)
[facility]-EMERGENCY DEPT Provider Note   CSN: [id number] Arrival date & time: 11/13/21  7829     History  Chief Complaint  Patient presents with   Cough   Shortness of Breath    Mark [name] is a 71 y.o. male.  71 yo M with a chief complaints of cough congestion subjective fevers and headache.  This been going on for about 5 days now.  He gets dialysis and has been around people at dialysis that might of been sick otherwise denies sick contact.  Has had some diarrhea off and on.  Has been eating and drinking but a bit less than normal.  Feeling a bit fatigued and rundown.  He did miss dialysis yesterday due to his illness.   Cough Associated symptoms: shortness of breath   Shortness of Breath Associated symptoms: cough       Home Medications Prior to Admission medications   Medication Sig Start Date End Date Taking? Authorizing Provider  acetaminophen (TYLENOL) 500 MG tablet Take 1,000 mg by mouth daily as needed for moderate pain or headache.   Yes [provider]  atorvastatin (LIPITOR) 20 MG tablet Take 1 tablet (20 mg total) by mouth daily. 02/05/21  Yes Marcine Matar, MD  benzonatate (TESSALON) 100 MG capsule Take 1 capsule (100 mg total) by mouth every 8 (eight) hours. 11/13/21  Yes Melene Plan, DO  carvedilol (COREG) 25 MG tablet Take 25 mg by mouth 2 (two) times daily. 10/08/21  Yes [provider]  furosemide (LASIX) 80 MG tablet Take 1 tablet (80 mg total) by mouth daily. Patient taking differently: Take 80 mg by mouth 2 (two) times daily. 11/02/21  Yes Ivonne Andrew, NP  insulin glargine (LANTUS SOLOSTAR) 100 UNIT/ML Solostar Pen Inject 20 Units into the skin at bedtime. 09/21/21  Yes Marcine Matar, MD  lanthanum (FOSRENOL) 1000 MG chewable tablet Chew 1,000 mg by mouth daily. 07/27/21  Yes [provider]  midodrine (PROAMATINE) 5 MG tablet Take 5 mg by mouth in the morning and at bedtime. Do not takes in bp  is lower 130/80 04/04/21  Yes [provider]  Multiple Vitamin (MULTIVITAMIN WITH MINERALS) TABS tablet Take 1 tablet by mouth daily.   Yes [provider]  mupirocin ointment (BACTROBAN) 2 % Apply 1 application topically 2 (two) times daily. 11/02/21  Yes Ivonne Andrew, NP  ondansetron (ZOFRAN-ODT) 4 MG disintegrating tablet 4mg  ODT q4 hours prn nausea/vomit 11/13/21  Yes Melene Plan, DO  VITAMIN D PO Take 1 capsule by mouth daily.   Yes [provider]  Blood Glucose Monitoring Suppl (ONETOUCH VERIO) w/Device KIT Use as directed to test blood sugar three times daily. 03/23/19   Marcine Matar, MD  carvedilol (COREG) 3.125 MG tablet Take 1 tablet (3.125 mg total) by mouth 2 (two) times daily. Patient not taking: Reported on 11/13/2021 09/02/21 09/02/22  Maretta Bees, MD  glucose blood (ONETOUCH VERIO) test strip USE AS DIRECTED THREE TIMES DAILY TO  TEST  BLOOD  SUGAR 03/15/21   Marcine Matar, MD  Insulin Pen Needle (TRUEPLUS 5-BEVEL PEN NEEDLES) 32G X 4 MM MISC Use to administer Lantus once daily. 02/19/21   Marcine Matar, MD  Insulin Syringe-Needle U-100 (INSULIN SYRINGE .5CC/30GX5/16") 30G X 5/16" 0.5 ML MISC Check blood sugar TID & QHS 10/30/14   Advani, Ayesha Rumpf, MD  OneTouch Delica Lancets 33G MISC Use as directed to test blood sugar three times daily. 06/17/21  Hoy Register, MD      Allergies    Patient has no known allergies.    Review of Systems   Review of Systems  Respiratory:  Positive for cough and shortness of breath.    Physical Exam Updated Vital Signs BP 128/75   Pulse 71   Temp 98.8 F (37.1 C) (Oral)   Resp 18   Ht 6\' 2"  (1.88 m)   Wt 119 kg   SpO2 95%   BMI 33.68 kg/m  Physical Exam Vitals and nursing note reviewed.  Constitutional:      Appearance: He is well-developed.  HENT:     Head: Normocephalic and atraumatic.     Comments: Swollen turbinates, posterior nasal drip  Eyes:     Pupils: Pupils are equal, round, and  reactive to light.  Neck:     Vascular: No JVD.  Cardiovascular:     Rate and Rhythm: Normal rate and regular rhythm.     Heart sounds: No murmur heard.   No friction rub. No gallop.  Pulmonary:     Effort: No respiratory distress.     Breath sounds: No wheezing.  Abdominal:     General: There is no distension.     Tenderness: There is no abdominal tenderness. There is no guarding or rebound.  Musculoskeletal:        General: Normal range of motion.     Cervical back: Normal range of motion and neck supple.     Comments: Left AV fistula with palpable thrill Left lower leg amputation  Skin:    Coloration: Skin is not pale.     Findings: No rash.  Neurological:     Mental Status: He is alert and oriented to person, place, and time.  Psychiatric:        Behavior: Behavior normal.    ED Results / Procedures / Treatments   Labs (all labs ordered are listed, but only abnormal results are displayed) Labs Reviewed  RESP PANEL BY RT-PCR (FLU A&B, COVID) ARPGX2 - Abnormal; Notable for the following components:      Result Value   SARS Coronavirus 2 by RT PCR POSITIVE (*)    All other components within normal limits  CBC WITH DIFFERENTIAL/PLATELET - Abnormal; Notable for the following components:   WBC 10.7 (*)    RBC 3.44 (*)    Hemoglobin 10.8 (*)    HCT 33.1 (*)    RDW 16.4 (*)    Monocytes Absolute 1.5 (*)    All other components within normal limits  BASIC METABOLIC PANEL - Abnormal; Notable for the following components:   CO2 20 (*)    Glucose, Bld 135 (*)    BUN 92 (*)    Creatinine, Ser 12.73 (*)    GFR, Estimated 4 (*)    All other components within normal limits    EKG EKG Interpretation  Date/Time:  Friday November 13 2021 07:44:16 EDT Ventricular Rate:  69 PR Interval:  194 QRS Duration: 95 QT Interval:  419 QTC Calculation: 449 R Axis:   -36 Text Interpretation: Sinus rhythm Atrial premature complex Left axis deviation No significant change since last  tracing Confirmed by Melene Plan [phone]) on 11/13/2021 9:19:34 AM  Radiology DG Chest Port 1 View  Result Date: 11/13/2021 CLINICAL DATA:  A 71 year old male presents for evaluation of cough and shortness of breath. EXAM: PORTABLE CHEST 1 VIEW COMPARISON:  September 02, 2021. FINDINGS: EKG leads project over the chest. Trachea midline. Cardiomediastinal contours and  hilar structures are unchanged. Central pulmonary vascular congestion is noted. Graded opacity at the RIGHT and LEFT lung base with potential blunting of LEFT and RIGHT costodiaphragmatic sulcus. Overlapping soft tissues on this portable radiograph could also cause a similar appearance. No visible pneumothorax. No lobar consolidation. On limited assessment there is no acute skeletal process. IMPRESSION: 1. Central pulmonary vascular congestion. 2. Suspect graded opacity over the LEFT and RIGHT chest represents overlapping soft tissues on this portable radiograph due to large patient body habitus. Difficult to exclude small effusions. No lobar consolidation. Electronically Signed   By: Donzetta Kohut M.D.   On: 11/13/2021 08:15    Procedures Procedures    Medications Ordered in ED Medications  benzonatate (TESSALON) capsule 100 mg (100 mg Oral Given 11/13/21 0750)  ondansetron (ZOFRAN-ODT) disintegrating tablet 4 mg (4 mg Oral Given 11/13/21 0750)    ED Course/ Medical Decision Making/ A&P                           Medical Decision Making Amount and/or Complexity of Data Reviewed Labs: ordered. Radiology: ordered. ECG/medicine tests: ordered.  Risk Prescription drug management.   71 yo M with a significant past medical history of end-stage renal disease on dialysis every Tuesday Thursday and Saturday unfortunately he missed his session yesterday due to his illness.  Is here with what sounds like upper respiratory symptoms cough congestion headache subjective fevers going on for about 5 days now.  He is not hypoxic or tachypneic.  No  adventitious lung sounds on my exam.  Signs of URI with swollen turbinates and posterior nasal drip on exam.  We will obtain a plain film of the chest blood work to assess for urgent need for dialysis EKG.  Patient's lab work without need for urgent dialysis no hyperkalemia no significant acidosis, very mild leukocytosis no significant anemia.  Chest x-ray independently interpreted by me without focal infiltrate.  Radiology read with likely soft tissue overlying.  The patient's COVID test did come back as positive.  Unfortunately he is on day 5 of symptoms and I do not feel that they would benefit him.  We will have him follow-up with his nephrologist.  9:31 AM:  I have discussed the diagnosis/risks/treatment options with the patient.  Evaluation and diagnostic testing in the emergency department does not suggest an emergent condition requiring admission or immediate intervention beyond what has been performed at this time.  They will follow up with  PCP. We also discussed returning to the ED immediately if new or worsening sx occur. We discussed the sx which are most concerning (e.g., sudden worsening pain, fever, inability to tolerate by mouth) that necessitate immediate return. Medications administered to the patient during their visit and any new prescriptions provided to the patient are listed below.  Medications given during this visit Medications  benzonatate (TESSALON) capsule 100 mg (100 mg Oral Given 11/13/21 0750)  ondansetron (ZOFRAN-ODT) disintegrating tablet 4 mg (4 mg Oral Given 11/13/21 0750)     The patient appears reasonably screen and/or stabilized for discharge and I doubt any other medical condition or other [facility] requiring further screening, evaluation, or treatment in the ED at this time prior to discharge.          Final Clinical Impression(s) / ED Diagnoses Final diagnoses:  Viral upper respiratory tract infection    Rx / DC Orders ED Discharge Orders           Ordered  benzonatate (TESSALON) 100 MG capsule  Every 8 hours        11/13/21 0920    ondansetron (ZOFRAN-ODT) 4 MG disintegrating tablet        11/13/21 0920              Melene Plan, DO 11/13/21 [phone]

## 2021-11-13 NOTE — ED Triage Notes (Signed)
Pt to ER with c/o cough, SHOB, fever and headache since 3/12.  States he called his PCP who told him to come here and get evaluated.  Pt is dialysis pt, missed yesterday's treatment. ?

## 2021-11-13 NOTE — Discharge Instructions (Signed)
Your chest x-ray did not show a pneumonia.  Your blood work did not indicate that you need dialysis emergently.  Please go to dialysis as scheduled tomorrow.  Return for difficulty breathing confusion if you pass out. ? ?Take tylenol 2 pills 4 times a day.  Return for worsening shortness of breath, headache, confusion. Follow up with your family doctor.  ? ?

## 2021-11-14 DIAGNOSIS — N2581 Secondary hyperparathyroidism of renal origin: Secondary | ICD-10-CM | POA: Diagnosis not present

## 2021-11-14 DIAGNOSIS — Z992 Dependence on renal dialysis: Secondary | ICD-10-CM | POA: Diagnosis not present

## 2021-11-14 DIAGNOSIS — N186 End stage renal disease: Secondary | ICD-10-CM | POA: Diagnosis not present

## 2021-11-14 DIAGNOSIS — D472 Monoclonal gammopathy: Secondary | ICD-10-CM | POA: Diagnosis not present

## 2021-11-14 DIAGNOSIS — D509 Iron deficiency anemia, unspecified: Secondary | ICD-10-CM | POA: Diagnosis not present

## 2021-11-14 DIAGNOSIS — D688 Other specified coagulation defects: Secondary | ICD-10-CM | POA: Diagnosis not present

## 2021-11-19 ENCOUNTER — Encounter (HOSPITAL_COMMUNITY): Payer: Self-pay | Admitting: Pharmacy Technician

## 2021-11-19 ENCOUNTER — Observation Stay (HOSPITAL_COMMUNITY)
Admission: EM | Admit: 2021-11-19 | Discharge: 2021-11-20 | Discharge status: Against Medical Advice | Payer: Medicare Other | Attending: Internal Medicine | Admitting: Internal Medicine

## 2021-11-19 ENCOUNTER — Ambulatory Visit: Payer: Self-pay | Admitting: *Deleted

## 2021-11-19 ENCOUNTER — Other Ambulatory Visit: Payer: Self-pay

## 2021-11-19 ENCOUNTER — Emergency Department (HOSPITAL_COMMUNITY): Payer: Medicare Other

## 2021-11-19 ENCOUNTER — Telehealth: Payer: Self-pay | Admitting: *Deleted

## 2021-11-19 DIAGNOSIS — N186 End stage renal disease: Secondary | ICD-10-CM | POA: Diagnosis not present

## 2021-11-19 DIAGNOSIS — E1122 Type 2 diabetes mellitus with diabetic chronic kidney disease: Secondary | ICD-10-CM

## 2021-11-19 DIAGNOSIS — E785 Hyperlipidemia, unspecified: Secondary | ICD-10-CM

## 2021-11-19 DIAGNOSIS — R0602 Shortness of breath: Secondary | ICD-10-CM | POA: Diagnosis not present

## 2021-11-19 DIAGNOSIS — J069 Acute upper respiratory infection, unspecified: Secondary | ICD-10-CM | POA: Diagnosis not present

## 2021-11-19 DIAGNOSIS — I132 Hypertensive heart and chronic kidney disease with heart failure and with stage 5 chronic kidney disease, or end stage renal disease: Secondary | ICD-10-CM | POA: Insufficient documentation

## 2021-11-19 DIAGNOSIS — U071 COVID-19: Principal | ICD-10-CM

## 2021-11-19 DIAGNOSIS — A0472 Enterocolitis due to Clostridium difficile, not specified as recurrent: Secondary | ICD-10-CM | POA: Diagnosis not present

## 2021-11-19 DIAGNOSIS — Z79899 Other long term (current) drug therapy: Secondary | ICD-10-CM | POA: Insufficient documentation

## 2021-11-19 DIAGNOSIS — B9789 Other viral agents as the cause of diseases classified elsewhere: Secondary | ICD-10-CM | POA: Diagnosis not present

## 2021-11-19 DIAGNOSIS — R197 Diarrhea, unspecified: Secondary | ICD-10-CM | POA: Diagnosis not present

## 2021-11-19 DIAGNOSIS — Z87891 Personal history of nicotine dependence: Secondary | ICD-10-CM | POA: Insufficient documentation

## 2021-11-19 DIAGNOSIS — Z992 Dependence on renal dialysis: Secondary | ICD-10-CM | POA: Diagnosis not present

## 2021-11-19 DIAGNOSIS — E8770 Fluid overload, unspecified: Secondary | ICD-10-CM | POA: Diagnosis not present

## 2021-11-19 DIAGNOSIS — Z794 Long term (current) use of insulin: Secondary | ICD-10-CM | POA: Diagnosis not present

## 2021-11-19 DIAGNOSIS — Z743 Need for continuous supervision: Secondary | ICD-10-CM | POA: Diagnosis not present

## 2021-11-19 DIAGNOSIS — I509 Heart failure, unspecified: Secondary | ICD-10-CM | POA: Diagnosis not present

## 2021-11-19 DIAGNOSIS — I10 Essential (primary) hypertension: Secondary | ICD-10-CM | POA: Diagnosis not present

## 2021-11-19 LAB — CBC WITH DIFFERENTIAL/PLATELET
Abs Immature Granulocytes: 0.05 10*3/uL (ref 0.00–0.07)
Basophils Absolute: 0 10*3/uL (ref 0.0–0.1)
Basophils Relative: 1 %
Eosinophils Absolute: 0.4 10*3/uL (ref 0.0–0.5)
Eosinophils Relative: 5 %
HCT: 33.5 % — ABNORMAL LOW (ref 39.0–52.0)
Hemoglobin: 10.9 g/dL — ABNORMAL LOW (ref 13.0–17.0)
Immature Granulocytes: 1 %
Lymphocytes Relative: 38 %
Lymphs Abs: 3.2 10*3/uL (ref 0.7–4.0)
MCH: 30.9 pg (ref 26.0–34.0)
MCHC: 32.5 g/dL (ref 30.0–36.0)
MCV: 94.9 fL (ref 80.0–100.0)
Monocytes Absolute: 0.5 10*3/uL (ref 0.1–1.0)
Monocytes Relative: 5 %
Neutro Abs: 4.2 10*3/uL (ref 1.7–7.7)
Neutrophils Relative %: 50 %
Platelets: 268 10*3/uL (ref 150–400)
RBC: 3.53 MIL/uL — ABNORMAL LOW (ref 4.22–5.81)
RDW: 16.1 % — ABNORMAL HIGH (ref 11.5–15.5)
WBC: 8.3 10*3/uL (ref 4.0–10.5)
nRBC: 0 % (ref 0.0–0.2)

## 2021-11-19 LAB — COMPREHENSIVE METABOLIC PANEL
ALT: 23 U/L (ref 0–44)
AST: 19 U/L (ref 15–41)
Albumin: 3 g/dL — ABNORMAL LOW (ref 3.5–5.0)
Alkaline Phosphatase: 69 U/L (ref 38–126)
Anion gap: 20 — ABNORMAL HIGH (ref 5–15)
BUN: 102 mg/dL — ABNORMAL HIGH (ref 8–23)
CO2: 17 mmol/L — ABNORMAL LOW (ref 22–32)
Calcium: 8.8 mg/dL — ABNORMAL LOW (ref 8.9–10.3)
Chloride: 105 mmol/L (ref 98–111)
Creatinine, Ser: 18.38 mg/dL — ABNORMAL HIGH (ref 0.61–1.24)
GFR, Estimated: 2 mL/min — ABNORMAL LOW (ref >60)
Glucose, Bld: 77 mg/dL (ref 70–99)
Potassium: 5.4 mmol/L — ABNORMAL HIGH (ref 3.5–5.1)
Sodium: 142 mmol/L (ref 135–145)
Total Bilirubin: 0.7 mg/dL (ref 0.3–1.2)
Total Protein: 7.5 g/dL (ref 6.5–8.1)

## 2021-11-19 LAB — FERRITIN: Ferritin: 1328 ng/mL — ABNORMAL HIGH (ref 24–336)

## 2021-11-19 LAB — C-REACTIVE PROTEIN: CRP: 16 mg/dL — ABNORMAL HIGH (ref <1.0)

## 2021-11-19 LAB — LACTATE DEHYDROGENASE: LDH: 191 U/L (ref 98–192)

## 2021-11-19 LAB — TROPONIN I (HIGH SENSITIVITY)
Troponin I (High Sensitivity): 63 ng/L — ABNORMAL HIGH (ref <18)
Troponin I (High Sensitivity): 62 ng/L — ABNORMAL HIGH (ref <18)

## 2021-11-19 LAB — MRSA NEXT GEN BY PCR, NASAL: MRSA by PCR Next Gen: NOT DETECTED

## 2021-11-19 LAB — GLUCOSE, CAPILLARY
Glucose-Capillary: 52 mg/dL — ABNORMAL LOW (ref 70–99)
Glucose-Capillary: 66 mg/dL — ABNORMAL LOW (ref 70–99)

## 2021-11-19 LAB — BRAIN NATRIURETIC PEPTIDE: B Natriuretic Peptide: 407.8 pg/mL — ABNORMAL HIGH (ref 0.0–100.0)

## 2021-11-19 LAB — PROCALCITONIN: Procalcitonin: 0.76 ng/mL

## 2021-11-19 MED ORDER — ALBUTEROL SULFATE HFA 108 (90 BASE) MCG/ACT IN AERS
2.0000 | INHALATION_SPRAY | Freq: Once | RESPIRATORY_TRACT | Status: AC
  Administered 2021-11-19: 2 via RESPIRATORY_TRACT
  Filled 2021-11-19: qty 6.7

## 2021-11-19 MED ORDER — GUAIFENESIN-DM 100-10 MG/5ML PO SYRP: 10.0000 mL | ORAL_SOLUTION | ORAL | Status: DC | PRN

## 2021-11-19 MED ORDER — HEPARIN SODIUM (PORCINE) 5000 UNIT/ML IJ SOLN
5000.0000 [IU] | Freq: Three times a day (TID) | INTRAMUSCULAR | Status: DC
  Administered 2021-11-19: 5000 [IU] via SUBCUTANEOUS
  Filled 2021-11-19 (×2): qty 1

## 2021-11-19 MED ORDER — SODIUM ZIRCONIUM CYCLOSILICATE 5 G PO PACK
5.0000 g | PACK | Freq: Once | ORAL | Status: AC
  Administered 2021-11-20: 5 g via ORAL
  Filled 2021-11-19: qty 1

## 2021-11-19 MED ORDER — CINACALCET HCL 30 MG PO TABS
90.0000 mg | ORAL_TABLET | Freq: Once | ORAL | Status: DC
  Filled 2021-11-19: qty 3

## 2021-11-19 MED ORDER — TRAZODONE HCL 50 MG PO TABS: 25.0000 mg | ORAL_TABLET | Freq: Every evening | ORAL | Status: DC | PRN

## 2021-11-19 MED ORDER — ONDANSETRON 4 MG PO TBDP: 4.0000 mg | ORAL_TABLET | ORAL | Status: DC | PRN

## 2021-11-19 MED ORDER — MIDODRINE HCL 5 MG PO TABS
5.0000 mg | ORAL_TABLET | Freq: Two times a day (BID) | ORAL | Status: DC
  Filled 2021-11-19: qty 1

## 2021-11-19 MED ORDER — INSULIN GLARGINE 100 UNIT/ML SOLOSTAR PEN: 20.0000 [IU] | PEN_INJECTOR | Freq: Every day | SUBCUTANEOUS | Status: DC

## 2021-11-19 MED ORDER — ACETAMINOPHEN 650 MG RE SUPP: 650.0000 mg | Freq: Four times a day (QID) | RECTAL | Status: DC | PRN

## 2021-11-19 MED ORDER — ZINC SULFATE 220 (50 ZN) MG PO CAPS
220.0000 mg | ORAL_CAPSULE | Freq: Every day | ORAL | Status: DC
  Administered 2021-11-19: 220 mg via ORAL
  Filled 2021-11-19 (×2): qty 1

## 2021-11-19 MED ORDER — LANTHANUM CARBONATE 500 MG PO CHEW
1000.0000 mg | CHEWABLE_TABLET | Freq: Every day | ORAL | Status: DC
  Filled 2021-11-19: qty 2

## 2021-11-19 MED ORDER — ATORVASTATIN CALCIUM 10 MG PO TABS
20.0000 mg | ORAL_TABLET | Freq: Every day | ORAL | Status: DC
  Filled 2021-11-19: qty 2

## 2021-11-19 MED ORDER — MAGNESIUM HYDROXIDE 400 MG/5ML PO SUSP: 30.0000 mL | Freq: Every day | ORAL | Status: DC | PRN

## 2021-11-19 MED ORDER — GUAIFENESIN ER 600 MG PO TB12
600.0000 mg | ORAL_TABLET | Freq: Two times a day (BID) | ORAL | Status: DC
  Administered 2021-11-19: 600 mg via ORAL
  Filled 2021-11-19 (×2): qty 1

## 2021-11-19 MED ORDER — SODIUM BICARBONATE 8.4 % IV SOLN
50.0000 meq | Freq: Once | INTRAVENOUS | Status: AC
  Administered 2021-11-20: 50 meq via INTRAVENOUS
  Filled 2021-11-19: qty 50

## 2021-11-19 MED ORDER — HYDROCOD POLI-CHLORPHE POLI ER 10-8 MG/5ML PO SUER: 5.0000 mL | Freq: Two times a day (BID) | ORAL | Status: DC | PRN

## 2021-11-19 MED ORDER — INSULIN ASPART 100 UNIT/ML IJ SOLN: 0.0000 [IU] | Freq: Three times a day (TID) | INTRAMUSCULAR | Status: DC

## 2021-11-19 MED ORDER — FUROSEMIDE 20 MG PO TABS: 80.0000 mg | ORAL_TABLET | Freq: Two times a day (BID) | ORAL | Status: DC

## 2021-11-19 MED ORDER — ASCORBIC ACID 500 MG PO TABS
500.0000 mg | ORAL_TABLET | Freq: Every day | ORAL | Status: DC
  Filled 2021-11-19 (×2): qty 1

## 2021-11-19 MED ORDER — VITAMIN D 25 MCG (1000 UNIT) PO TABS
1000.0000 [IU] | ORAL_TABLET | Freq: Every day | ORAL | Status: DC
Start: 2021-11-19 — End: 2021-11-20
  Filled 2021-11-19 (×2): qty 1

## 2021-11-19 MED ORDER — MOLNUPIRAVIR EUA 200MG CAPSULE
4.0000 | ORAL_CAPSULE | Freq: Two times a day (BID) | ORAL | Status: DC
  Administered 2021-11-19: 800 mg via ORAL
  Filled 2021-11-19: qty 4

## 2021-11-19 MED ORDER — ACETAMINOPHEN 325 MG PO TABS
650.0000 mg | ORAL_TABLET | Freq: Four times a day (QID) | ORAL | Status: DC | PRN
  Administered 2021-11-19: 650 mg via ORAL
  Filled 2021-11-19: qty 2

## 2021-11-19 MED ORDER — INSULIN GLARGINE-YFGN 100 UNIT/ML ~~LOC~~ SOLN
10.0000 [IU] | Freq: Every day | SUBCUTANEOUS | Status: DC
  Filled 2021-11-19: qty 0.1

## 2021-11-19 MED ORDER — INSULIN GLARGINE-YFGN 100 UNIT/ML ~~LOC~~ SOLN
20.0000 [IU] | Freq: Every day | SUBCUTANEOUS | Status: DC
  Filled 2021-11-19: qty 0.2

## 2021-11-19 MED ORDER — CALCITRIOL 0.5 MCG PO CAPS
1.0000 ug | ORAL_CAPSULE | Freq: Once | ORAL | Status: AC
  Administered 2021-11-19: 1 ug via ORAL
  Filled 2021-11-19: qty 2

## 2021-11-19 MED ORDER — FUROSEMIDE 10 MG/ML IJ SOLN
80.0000 mg | Freq: Two times a day (BID) | INTRAMUSCULAR | Status: DC
  Administered 2021-11-19: 80 mg via INTRAVENOUS
  Filled 2021-11-19 (×2): qty 8

## 2021-11-19 MED ORDER — CHLORHEXIDINE GLUCONATE CLOTH 2 % EX PADS: 6.0000 | MEDICATED_PAD | Freq: Every day | CUTANEOUS | Status: DC

## 2021-11-19 MED ORDER — BENZONATATE 100 MG PO CAPS
100.0000 mg | ORAL_CAPSULE | Freq: Three times a day (TID) | ORAL | Status: DC
  Administered 2021-11-20: 100 mg via ORAL
  Filled 2021-11-19 (×2): qty 1

## 2021-11-19 MED ORDER — ONDANSETRON HCL 4 MG/2ML IJ SOLN: 4.0000 mg | Freq: Four times a day (QID) | INTRAMUSCULAR | Status: DC | PRN

## 2021-11-19 MED ORDER — ONDANSETRON HCL 4 MG PO TABS: 4.0000 mg | ORAL_TABLET | Freq: Four times a day (QID) | ORAL | Status: DC | PRN

## 2021-11-19 MED ORDER — ADULT MULTIVITAMIN W/MINERALS CH
1.0000 | ORAL_TABLET | Freq: Every day | ORAL | Status: DC
  Filled 2021-11-19: qty 1

## 2021-11-19 MED ORDER — CARVEDILOL 25 MG PO TABS
25.0000 mg | ORAL_TABLET | Freq: Two times a day (BID) | ORAL | Status: DC
  Filled 2021-11-19: qty 1
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
## 2021-11-19 NOTE — ED Notes (Signed)
Patient transported to X-ray 

## 2021-11-19 NOTE — Assessment & Plan Note (Addendum)
Lipitor daily ?

## 2021-11-19 NOTE — Assessment & Plan Note (Addendum)
Continue daily coreg ?

## 2021-11-19 NOTE — Assessment & Plan Note (Addendum)
Secondary to missing HD sessions.  Nephrology consulted for hemodialysis. ? ? ?

## 2021-11-19 NOTE — ED Triage Notes (Signed)
Pt bib ems from home. Pt dx with covid 1 week ago,. Since then pt has had worsening diarrhea and shob. Pt T,Th,S dialysis pt with last dialysis Saturday. VSS with EMS.  ?

## 2021-11-19 NOTE — Telephone Encounter (Signed)
Returned call to pt who states EMS did not come. He was very upset. I got EMS on the phone with pt and was told they are backed up. When an ambulance is available they will be sending in order of severity. Patient understands and was told to lie on couch and rest until they get there. ?

## 2021-11-19 NOTE — Progress Notes (Signed)
I was called about this patient-  an ESRD pt from [place]-  non compliant-  misses and shortens-  last dialysis was 3/18 when he signed off early and left 2.5 kg over his EDW.  Before that last HD was 3/14-  again signed off early.  He presents with SOB-  is covid positive.  I was asked to provide dialysis- K 5.4 and BUN of 102.  HD nursing is spoken for until late tonight but they have been told to run him ASAP -  orders are in ? ?His usual HD TTS second  [place]- 4 hours  2/2 bath-  EDW 113- AVF-  15 gauge needles- 400 BFR-  heparin- yes ?Gets calcitriol 1 mcg and 90 of sensipar ? ?Last hgb 11.4, phos 10.4 and PTH 1599 ? ?If patient gets admitted will do full consult  ? ?Louis Meckel  ?

## 2021-11-19 NOTE — ED Notes (Signed)
ED TO INPATIENT HANDOFF REPORT ? ?ED Nurse Name and Phone #: Shirlee Limerick [phone] ? ?S ?Name/Age/Gender ?Mark [name] ?71 y.o. ?male ?Room/Bed: 033C/033C ? ?Code Status ?  Code Status: Prior ? ?Home/SNF/Other ?Home ?Patient oriented to: self, place, time, and situation ?Is this baseline? Yes  ? ?Triage Complete: Triage complete  ?Chief Complaint ?COVID-19 virus infection [U07.1] ? ?Triage Note ?Pt bib ems from home. Pt dx with covid 1 week ago,. Since then pt has had worsening diarrhea and shob. Pt T,Th,S dialysis pt with last dialysis Saturday. VSS with EMS.   ? ?Allergies ?No Known Allergies ? ?Level of Care/Admitting Diagnosis ?ED Disposition   ? ? ED Disposition  ?Admit  ? Condition  ?--  ? Comment  ?Hospital Area: [facility] [[id number]] ? Level of Care: Telemetry Medical [[id number]] ? May place patient in observation at [name] or [place] if equivalent level of care is available:: No ? Covid Evaluation: Confirmed COVID Positive ? Diagnosis: COVID-19 virus infection [[id number]] ? Admitting Physician: Christel Mormon [[id number]] ? Attending Physician: Christel Mormon [[id number]] ?  ?  ? ?  ? ? ?B ?Medical/Surgery History ?Past Medical History:  ?Diagnosis Date  ? Acid indigestion   ? Acute encephalopathy 01/01/2016  ? Acute renal failure superimposed on stage 3 chronic kidney disease ([place]) 04/16/2015  ? Anemia 10/01/2013  ? Arthritis   ? Bursitis   ? CHF (congestive heart failure) ([place])   ? Chronic kidney disease   ? dialysis T-Th-Sat  ? CKD (chronic kidney disease) stage 3, GFR 30-59 ml/min (HCC) 08/18/2015  ? Diabetes mellitus, type 2 ([place]) 04/16/2015  ? Diarrhea   ? chronic  ? Diverticulitis   ? DM (diabetes mellitus), type 2 with peripheral vascular complications ([place])   ? right  leg  ? Elevated troponin 10/16/2014  ? Essential hypertension 04/08/2014  ? History of Clostridium difficile colitis 01/01/2016  ? History of kidney stones   ? passed x 2  ? Hypertension   ? no pcp  ? Hypothermia 01/01/2016  ?  Malnutrition of moderate degree ([place]) 04/17/2015  ? Multiple myeloma ([place])   ? Onychomycosis of toenail 04/30/2015  ? Phantom limb pain ([place]) 12/12/2013  ? left bka  ? Pneumonia 2020  ? in hosp 08/2021  ? Positive for microalbuminuria 08/18/2015  ? S/P BKA (below knee amputation) ([place]) 11/21/2013  ? 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    ? Seizure ([place]) 2015  ? 2015- "using drugs"  had seizure as a child , none after age 21- did not know what caused the seizures  ? Seizures ([place])   ? Spleen absent   ? Substance abuse ([place]) 04/02/2016  ? Cocaine  ? Wound infection 01/02/2016  ? ?Past Surgical History:  ?Procedure Laterality Date  ? AMPUTATION Left 10/02/2013  ? Procedure: Repeat irrigation and debridement left foot, left 3rd toe amputation;  Surgeon: Mcarthur Rossetti, MD;  Location: [facility];  Service: Orthopedics;  Laterality: Left;  ? AMPUTATION Left 11/06/2013  ? Procedure: LEFT FOOT TRANSMETATARSAL AMPUTATION ;  Surgeon: Mcarthur Rossetti, MD;  Location: [place];  Service: Orthopedics;  Laterality: Left;  ? AMPUTATION Left 11/21/2013  ? Procedure: AMPUTATION BELOW KNEE;  Surgeon: Newt Minion, MD;  Location: [place];  Service: Orthopedics;  Laterality: Left;  Left Below Knee Amputation  ? AMPUTATION Right 09/02/2017  ? Procedure: AMPUTATION RAY;  Surgeon: [name], MD;  Location: [facility];  Service: Orthopedics;  Laterality: Right;  ?  APPLICATION OF WOUND VAC Left 10/05/2013  ? Procedure: APPLICATION OF WOUND VAC;  Surgeon: Mcarthur Rossetti, MD;  Location: [facility];  Service: Orthopedics;  Laterality: Left;  ? AV FISTULA PLACEMENT Left 07/16/2020  ? Procedure: LEFT ARM ARTERIOVENOUS (AV) FISTULA;  Surgeon: Serafina Mitchell, MD;  Location: [place];  Service: Vascular;  Laterality: Left;  ? BASCILIC VEIN TRANSPOSITION Left 09/26/2020  ? Procedure: LEFT ARM SECOND STAGE [place];  Surgeon: Serafina Mitchell, MD;  Location: [place];  Service: Vascular;  Laterality: Left;  ? [place]  ?  diverticulitis  ? COLONOSCOPY W/ POLYPECTOMY    ? HEMATOMA EVACUATION Left 11/26/2020  ? Procedure: EVACUATION HEMATOMA LEFT ARM;  Surgeon: Serafina Mitchell, MD;  Location: [facility];  Service: Vascular;  Laterality: Left;  ? I & D EXTREMITY Left 09/27/2013  ? Procedure: IRRIGATION AND DEBRIDEMENT EXTREMITY;  Surgeon: Mcarthur Rossetti, MD;  [facility];  Service: Orthopedics;  Laterality: Left;  ? I & D EXTREMITY Left 10/02/2013  ? Procedure: IRRIGATION AND DEBRIDEMENT EXTREMITY;  Surgeon: Mcarthur Rossetti, MD;  [facility];  Service: Orthopedics;  Laterality: Left;  ? I & D EXTREMITY Left 10/05/2013  ? Procedure: REPEAT IRRIGATION AND DEBRIDEMENT LEFT FOOT, SPLIT THICKNESS SKIN GRAFT;  Surgeon: Mcarthur Rossetti, MD;  [facility];  Service: Orthopedics;  Laterality: Left;  ? I & D EXTREMITY Right 09/08/2017  ? Procedure: DEBRIDEMENT RIGHT FOOT AND WOUND VAC CHANGE;  Surgeon: [name], MD;  [facility];  Service: Orthopedics;  Laterality: Right;  ? INCISIONAL HERNIA REPAIR N/A 07/14/2016  ? Procedure: LAPAROSCOPIC INCISIONAL HERNIA;  Surgeon: Mickeal Skinner, MD;  Location: [place];  Service: General;  Laterality: N/A;  ? INSERTION OF MESH N/A 07/14/2016  ? Procedure: INSERTION OF MESH;  Surgeon: Mickeal Skinner, MD;  Location: [place];  Service: General;  Laterality: N/A;  ? INTRAMEDULLARY (IM) NAIL INTERTROCHANTERIC Right 01/01/2017  ? Procedure: INTRAMEDULLARY (IM) NAIL INTERTROCHANTRIC;  Surgeon: Meredith Pel, MD;  Location: [place];  Service: Orthopedics;  Laterality: Right;  ? SKIN SPLIT GRAFT Left 10/05/2013  ? Procedure: SKIN GRAFT SPLIT THICKNESS;  Surgeon: Mcarthur Rossetti, MD;  [facility];  Service: Orthopedics;  Laterality: Left;  ? SPLENECTOMY    ? rutptured in stabbing  ?  ? ?A ?IV Location/Drains/Wounds ?Patient Lines/Drains/Airways Status   ? ? Active Line/Drains/Airways   ? ? Name Placement date Placement time Site Days  ? Peripheral IV 11/13/21 22 G  Posterior;Right Hand 11/13/21  0744  Hand  6  ? Fistula / Graft Left Upper arm Arteriovenous fistula 09/26/20  0830  Upper arm  419  ? Closed System Drain 1 Left Other (Comment) Bulb (JP) 15 Fr. 11/26/20  2054  Other (Comment)  358  ? Negative Pressure Wound Therapy Foot Right 09/08/17  2026  --  1533  ? Incision (Closed) 01/01/17 Leg 01/01/17  1748  -- 1783  ? Incision (Closed) 09/02/17 Foot Right 09/02/17  1854  -- 1539  ? Incision (Closed) 09/08/17 Foot Right 09/08/17  2025  -- 1533  ? Incision (Closed) 07/16/20 Arm Left 07/16/20  1219  -- 491  ? Incision (Closed) 09/26/20 Arm Left 09/26/20  0925  -- 419  ? Incision (Closed) 11/26/20 Arm Left 11/26/20  2124  -- 358  ? Wound / Incision (Open or Dehisced) 01/01/17 Non-pressure wound Leg Anterior;Right open wound /ulcer 2.5 cm x 2.5 cm 01/01/17  0400  Leg  1783  ? ?  ?  ? ?  ? ? ?Intake/Output Last 24 hours ?No intake or output data in the 24 hours ending 11/19/21 1949 ? ?Labs/Imaging ?Results for orders placed or performed during the hospital encounter of 11/19/21 (from the past 48 hour(s))  ?CBC with Differential     Status: Abnormal  ? Collection Time: 11/19/21  2:11 PM  ?Result Value Ref Range  ? WBC 8.3 4.0 - 10.5 K/uL  ? RBC 3.53 (L) 4.22 - 5.81 MIL/uL  ? Hemoglobin 10.9 (L) 13.0 - 17.0 g/dL  ? HCT 33.5 (L) 39.0 - 52.0 %  ? MCV 94.9 80.0 - 100.0 fL  ? MCH 30.9 26.0 - 34.0 pg  ? MCHC 32.5 30.0 - 36.0 g/dL  ? RDW 16.1 (H) 11.5 - 15.5 %  ? Platelets 268 150 - 400 K/uL  ? nRBC 0.0 0.0 - 0.2 %  ? Neutrophils Relative % 50 %  ? Neutro Abs 4.2 1.7 - 7.7 K/uL  ? Lymphocytes Relative 38 %  ? Lymphs Abs 3.2 0.7 - 4.0 K/uL  ? Monocytes Relative 5 %  ? Monocytes Absolute 0.5 0.1 - 1.0 K/uL  ? Eosinophils Relative 5 %  ? Eosinophils Absolute 0.4 0.0 - 0.5 K/uL  ? Basophils Relative 1 %  ? Basophils Absolute 0.0 0.0 - 0.1 K/uL  ? Immature Granulocytes 1 %  ? Abs Immature Granulocytes 0.05 0.00 - 0.07 K/uL  ?  Comment: Performed at [facility], [place] [address]  [place], [place] [address]  ?Comprehensive metabolic panel     Status: Abnormal  ? Collection Time: 11/19/21  2:11 PM  ?Result Value Ref Range  ? Sodium 142 135 - 145 mmol/L  ? Potassium 5.4 (H) 3.5 - 5.1 mmol/L  ? [place]

## 2021-11-19 NOTE — Telephone Encounter (Signed)
Pt calling, asked pt if he was in ED as earlier triage note indicated. Pt stated "No, you all are supposed to be helping me and never do, just like my wife."  Attempted to deescalate, pt cussing profusely, angry affect, yelling, pt hung up. ?

## 2021-11-19 NOTE — Telephone Encounter (Signed)
?  Chief Complaint: severe diarrhea 5 days at least  ?Symptoms: weakness ?Frequency: constant ?Pertinent Negatives: Patient denies vomiting ?Disposition: '[x]'$ ED /'[]'$ Urgent Care (no appt availability in office) / '[]'$ Appointment(In office/virtual)/ '[]'$  [place] [facility]/ '[]'$ Home Care/ '[]'$ Refused Recommended Disposition /'[]'$ [place] [facility]/ '[]'$  Follow-up with PCP ?Additional Notes: Pt states has covid and has had diarrhea for a week or so, dehydrated, dialysis pt and could not go to dialysis due to having covid. Called EMS to his address. ? ? ?Reason for Disposition ? Patient sounds very sick or weak to the triager ? ?Answer Assessment - Initial Assessment Questions ?1. DIARRHEA SEVERITY: "How bad is the diarrhea?" "How many more stools have you had in the past 24 hours than normal?"  ?  - NO DIARRHEA (SCALE 0) ?  - MILD (SCALE 1-3): Few loose or mushy BMs; increase of 1-3 stools over normal daily number of stools; mild increase in ostomy output. ?  -  MODERATE (SCALE 4-7): Increase of 4-6 stools daily over normal; moderate increase in ostomy output. ?* SEVERE (SCALE 8-10; OR 'WORST POSSIBLE'): Increase of 7 or more stools daily over normal; moderate increase in ostomy output; incontinence. ?    10 ?2. ONSET: "When did the diarrhea begin?"  ?    5 days ago ?3. BM CONSISTENCY: "How loose or watery is the diarrhea?"  ?    [place], pt is dialysis pt, has missed dialysis due to [place], now [place] feels he is dehydrated and needs to be admitted. ?tth [not just dry lips], too weak to stand, dizziness, new weight loss) "When did you last urinate?" ? ?Protocols used: Diarrhea-A-AH ? ?

## 2021-11-19 NOTE — ED Provider Triage Note (Signed)
Emergency Medicine Provider Triage Evaluation Note ? ?Mark [name] , a 71 y.o. male  was evaluated in triage.  Pt complains of worsening shortness of breath and diarrhea.  Recently diagnosed with COVID on 3/17.  Has not been dialyzed since last Saturday due to not feeling up to it.  ? ?Review of Systems  ?Positive: + diarrhea, SOB, weakness ?Negative: - chest pain, vomiting ? ?Physical Exam  ?BP 120/75 (BP Location: Right Arm)   Pulse 69   Temp 98.6 ?F (37 ?C) (Oral)   Resp 18   SpO2 93%  ?Gen:   Awake, no distress   ?Resp:  Normal effort  ?MSK:   Moves extremities without difficulty  ?Other:   ? ?Medical Decision Making  ?Medically screening exam initiated at 2:03 PM.  Appropriate orders placed.  Mark [name] was informed that the remainder of the evaluation will be completed by another provider, this initial triage assessment does not replace that evaluation, and the importance of remaining in the ED until their evaluation is complete. ? ? ?  ?Eustaquio Maize, PA-C ?11/19/21 1404 ? ?

## 2021-11-19 NOTE — Assessment & Plan Note (Addendum)
Semglee 10 units at bedtime.  Sliding scale and Accu-Cheks ?

## 2021-11-19 NOTE — ED Provider Notes (Signed)
?[facility] ?Provider Note ? ? ?CSN: [id number] ?Arrival date & time: 11/19/21  1355 ? ?  ? ?History ? ?Chief Complaint  ?Patient presents with  ? Diarrhea  ? Shortness of Breath  ? ? ?Mark [name] is a 71 y.o. male. ? ?HPI ? ? Pt is a 71 y/o male with a h/o pneumonia, substance abuse, hypothermia, C. difficile, CKD, diabetes, hypertension, BKA, seizures, multiple myeloma, who presents to the ED today for eval of sob and diarrhea. States he has been sob which has been persistent for the last few days. States sxs are not worse. He has an associated cough as well. Denies fevers, chills, chest pain. States he cannot eat anything because he is having diarrhea. States he has had diarrhea for one month. Reports abd pain when he eats only. He had some vomiting earlier this week which has since resolved.  ? ?States he was dx with covid on 3/17. He gets dialyzed on T/Th/Sat though he has missed his last 2 sessions due to having COVID. ? ?Home Medications ?Prior to Admission medications   ?Medication Sig Start Date End Date Taking? Authorizing Provider  ?acetaminophen (TYLENOL) 500 MG tablet Take 1,000 mg by mouth daily as needed for moderate pain or headache.    [provider]  ?atorvastatin (LIPITOR) 20 MG tablet Take 1 tablet (20 mg total) by mouth daily. 02/05/21   [name], MD  ?benzonatate (TESSALON) 100 MG capsule Take 1 capsule (100 mg total) by mouth every 8 (eight) hours. 11/13/21   Deno Etienne, DO  ?Blood Glucose Monitoring Suppl (ONETOUCH VERIO) w/Device KIT Use as directed to test blood sugar three times daily. 03/23/19   [name], MD  ?carvedilol (COREG) 25 MG tablet Take 25 mg by mouth 2 (two) times daily. 10/08/21   [provider]  ?carvedilol (COREG) 3.125 MG tablet Take 1 tablet (3.125 mg total) by mouth 2 (two) times daily. ?Patient not taking: Reported on 11/13/2021 09/02/21 09/02/22  Jonetta Osgood, MD  ?furosemide (LASIX) 80 MG  tablet Take 1 tablet (80 mg total) by mouth daily. ?Patient taking differently: Take 80 mg by mouth 2 (two) times daily. 11/02/21   Fenton Foy, NP  ?glucose blood (ONETOUCH VERIO) test strip USE AS DIRECTED THREE TIMES DAILY TO  TEST  BLOOD  SUGAR 03/15/21   [name], MD  ?insulin glargine (LANTUS SOLOSTAR) 100 UNIT/ML Solostar Pen Inject 20 Units into the skin at bedtime. 09/21/21   [name], MD  ?Insulin Pen Needle (TRUEPLUS 5-BEVEL PEN NEEDLES) 32G X 4 MM MISC Use to administer Lantus once daily. 02/19/21   [name], MD  ?Insulin Syringe-Needle U-100 (INSULIN SYRINGE .5CC/30GX5/16") 30G X 5/16" 0.5 ML MISC Check blood sugar TID & QHS 10/30/14   Lorayne Marek, MD  ?lanthanum (FOSRENOL) 1000 MG chewable tablet Chew 1,000 mg by mouth daily. 07/27/21   [provider]  ?midodrine (PROAMATINE) 5 MG tablet Take 5 mg by mouth in the morning and at bedtime. Do not takes in bp is lower 130/80 04/04/21   [provider]  ?Multiple Vitamin (MULTIVITAMIN WITH MINERALS) TABS tablet Take 1 tablet by mouth daily.    [provider]  ?mupirocin ointment (BACTROBAN) 2 % Apply 1 application topically 2 (two) times daily. 11/02/21   Fenton Foy, NP  ?ondansetron (ZOFRAN-ODT) 4 MG disintegrating tablet 8m ODT q4 hours prn nausea/vomit 11/13/21   FDeno Etienne DO  ?OneTouch Delica Lancets  33G MISC Use as directed to test blood sugar three times daily. 06/17/21   Charlott Rakes, MD  ?VITAMIN D PO Take 1 capsule by mouth daily.    [provider]  ?   ? ?Allergies    ?Patient has no known allergies.   ? ?Review of Systems   ?Review of Systems ?See HPI for pertinent positives or negatives. ? ? ?Physical Exam ?Updated Vital Signs ?BP (!) 164/71   Pulse 71   Temp 98.6 ?F (37 ?C) (Oral)   Resp 18   SpO2 96%  ?Physical Exam ?Vitals and nursing note reviewed.  ?Constitutional:   ?   General: He is not in acute distress. ?   Appearance: He is well-developed.  ?HENT:  ?    Head: Normocephalic and atraumatic.  ?Eyes:  ?   Conjunctiva/sclera: Conjunctivae normal.  ?Cardiovascular:  ?   Rate and Rhythm: Normal rate and regular rhythm.  ?   Heart sounds: Normal heart sounds. No murmur heard. ?Pulmonary:  ?   Effort: Pulmonary effort is normal. No respiratory distress.  ?   Breath sounds: Examination of the right-middle field reveals rales. Examination of the left-middle field reveals rales. Examination of the right-lower field reveals rales. Examination of the left-lower field reveals rales. Rales present.  ?Abdominal:  ?   Palpations: Abdomen is soft.  ?   Tenderness: There is no abdominal tenderness.  ?Musculoskeletal:     ?   General: No swelling.  ?   Cervical back: Neck supple.  ?   Right lower leg: Edema present.  ?Skin: ?   General: Skin is warm and dry.  ?   Capillary Refill: Capillary refill takes less than 2 seconds.  ?Neurological:  ?   Mental Status: He is alert and oriented to person, place, and time.  ?Psychiatric:     ?   Mood and Affect: Mood normal.  ? ? ? ?ED Results / Procedures / Treatments   ?Labs ?(all labs ordered are listed, but only abnormal results are displayed) ?Labs Reviewed  ?CBC WITH DIFFERENTIAL/PLATELET - Abnormal; Notable for the following components:  ?    Result Value  ? RBC 3.53 (*)   ? Hemoglobin 10.9 (*)   ? HCT 33.5 (*)   ? RDW 16.1 (*)   ? All other components within normal limits  ?COMPREHENSIVE METABOLIC PANEL - Abnormal; Notable for the following components:  ? Potassium 5.4 (*)   ? CO2 17 (*)   ? BUN 102 (*)   ? Creatinine, Ser 18.38 (*)   ? Calcium 8.8 (*)   ? Albumin 3.0 (*)   ? GFR, Estimated 2 (*)   ? Anion gap 20 (*)   ? All other components within normal limits  ? ? ?EKG ?None ? ?Radiology ?DG Chest 2 View ? ?Result Date: 11/19/2021 ?CLINICAL DATA:  Shortness of breath EXAM: CHEST - 2 VIEW COMPARISON:  11/13/2021 FINDINGS: Transverse diameter of heart is increased. There are no signs of alveolar pulmonary edema or new focal infiltrates.  There is no pleural effusion or pneumothorax. IMPRESSION: No active cardiopulmonary disease. Electronically Signed   By: Elmer Picker M.D.   On: 11/19/2021 16:53   ? ?Procedures ?Procedures  ? ? ?Medications Ordered in ED ?Medications - No data to display ? ?ED Course/ Medical Decision Making/ A&P ?  ?                        ?Medical Decision Making ?  Risk ?Prescription drug management. ? ? ?This patient presents to the ED for concern of shortness of breath, diarrhea, this involves an extensive number of treatment options, and is a complaint that carries with it a high risk of complications and morbidity.  The differential diagnosis includes but is not limited to pneumonia, PE, CAD, CHF, hypervolemia due to missed dialysis, infectious cause. ? ?Comorbidities that complicate the patient evaluation: ?Patient?s presentation is complicated by their history of ESRD on dialysis, CHF, multiple other comorbidities ? ?Additional history obtained: ?Records reviewed previous admission documents, Care Everywhere/External Records, and Primary Care Documents ? ?Lab Tests: ?I Ordered, and personally interpreted labs.  The pertinent results include:   ?CBC is without leukocytosis, mild anemia is present which appears stable from baseline ?CMP with elevated potassium at 5.4, bicarb is low at 17 and anion gap is elevated at 120 likely due to missed dialysis.  Creatinine 18 consistent with missed dialysis. ? ?Imaging Studies ordered: ?I ordered, independently visualized, and interpreted imaging which showed  ?CXR - No active cardiopulmonary disease. ? ?I agree with the radiologist interpretation ? ?Cardiac Monitoring: ?The patient was maintained on a cardiac monitor.  I personally viewed and interpreted the cardiac monitor which showed an underlying rhythm of:  sinus rhythm ? ?Medicines ordered and prescription drug management: ?I ordered medication including albuterol  for sob  ? ?Critical Interventions:  dialysis ? ?Consultations Obtained: ?5:59 PM I consulted with the consultant Dr. Clover Mealy with nephrologist , and discussed  findings as well as pertinent plan - they recommend pt have dialysis today.  ? ?7:32 PM CONSULT with Dr. Man

## 2021-11-19 NOTE — Assessment & Plan Note (Addendum)
Has generalized weakness and diarrhea.   ?Started on molnupiravir by admitting provider; but I will dc it as his onset of symptoms and dx was >5 days ago. ?  Continue bronchodilators as needed.  Follow inflammatory markers. ? ?

## 2021-11-20 DIAGNOSIS — A0472 Enterocolitis due to Clostridium difficile, not specified as recurrent: Secondary | ICD-10-CM

## 2021-11-20 DIAGNOSIS — U071 COVID-19: Secondary | ICD-10-CM | POA: Diagnosis not present

## 2021-11-20 LAB — COMPREHENSIVE METABOLIC PANEL
ALT: 17 U/L (ref 0–44)
AST: 14 U/L — ABNORMAL LOW (ref 15–41)
Albumin: 2.9 g/dL — ABNORMAL LOW (ref 3.5–5.0)
Alkaline Phosphatase: 65 U/L (ref 38–126)
Anion gap: 19 — ABNORMAL HIGH (ref 5–15)
BUN: 108 mg/dL — ABNORMAL HIGH (ref 8–23)
CO2: 18 mmol/L — ABNORMAL LOW (ref 22–32)
Calcium: 8.8 mg/dL — ABNORMAL LOW (ref 8.9–10.3)
Chloride: 105 mmol/L (ref 98–111)
Creatinine, Ser: 19.03 mg/dL — ABNORMAL HIGH (ref 0.61–1.24)
GFR, Estimated: 2 mL/min — ABNORMAL LOW (ref >60)
Glucose, Bld: 131 mg/dL — ABNORMAL HIGH (ref 70–99)
Potassium: 5.3 mmol/L — ABNORMAL HIGH (ref 3.5–5.1)
Sodium: 142 mmol/L (ref 135–145)
Total Bilirubin: 0.6 mg/dL (ref 0.3–1.2)
Total Protein: 7.2 g/dL (ref 6.5–8.1)

## 2021-11-20 LAB — BASIC METABOLIC PANEL
Anion gap: 15 (ref 5–15)
BUN: 33 mg/dL — ABNORMAL HIGH (ref 8–23)
CO2: 24 mmol/L (ref 22–32)
Calcium: 8.7 mg/dL — ABNORMAL LOW (ref 8.9–10.3)
Chloride: 100 mmol/L (ref 98–111)
Creatinine, Ser: 8.79 mg/dL — ABNORMAL HIGH (ref 0.61–1.24)
GFR, Estimated: 6 mL/min — ABNORMAL LOW (ref >60)
Glucose, Bld: 75 mg/dL (ref 70–99)
Potassium: 4 mmol/L (ref 3.5–5.1)
Sodium: 139 mmol/L (ref 135–145)

## 2021-11-20 LAB — CBC WITH DIFFERENTIAL/PLATELET
Abs Immature Granulocytes: 0.01 10*3/uL (ref 0.00–0.07)
Basophils Absolute: 0 10*3/uL (ref 0.0–0.1)
Basophils Relative: 1 %
Eosinophils Absolute: 0.4 10*3/uL (ref 0.0–0.5)
Eosinophils Relative: 5 %
HCT: 31.2 % — ABNORMAL LOW (ref 39.0–52.0)
Hemoglobin: 10.5 g/dL — ABNORMAL LOW (ref 13.0–17.0)
Immature Granulocytes: 0 %
Lymphocytes Relative: 42 %
Lymphs Abs: 3.2 10*3/uL (ref 0.7–4.0)
MCH: 31.6 pg (ref 26.0–34.0)
MCHC: 33.7 g/dL (ref 30.0–36.0)
MCV: 94 fL (ref 80.0–100.0)
Monocytes Absolute: 0.5 10*3/uL (ref 0.1–1.0)
Monocytes Relative: 6 %
Neutro Abs: 3.5 10*3/uL (ref 1.7–7.7)
Neutrophils Relative %: 46 %
Platelets: 273 10*3/uL (ref 150–400)
RBC: 3.32 MIL/uL — ABNORMAL LOW (ref 4.22–5.81)
RDW: 16.1 % — ABNORMAL HIGH (ref 11.5–15.5)
WBC: 7.7 10*3/uL (ref 4.0–10.5)
nRBC: 0 % (ref 0.0–0.2)

## 2021-11-20 LAB — HEPATITIS B SURFACE ANTIBODY,QUALITATIVE: Hep B S Ab: REACTIVE — AB

## 2021-11-20 LAB — HEPATITIS B SURFACE ANTIGEN: Hepatitis B Surface Ag: NONREACTIVE

## 2021-11-20 LAB — C-REACTIVE PROTEIN: CRP: 15.7 mg/dL — ABNORMAL HIGH (ref <1.0)

## 2021-11-20 LAB — GLUCOSE, CAPILLARY
Glucose-Capillary: 176 mg/dL — ABNORMAL HIGH (ref 70–99)
Glucose-Capillary: 80 mg/dL (ref 70–99)
Glucose-Capillary: 84 mg/dL (ref 70–99)

## 2021-11-20 LAB — CLOSTRIDIUM DIFFICILE BY PCR, REFLEXED: Toxigenic C. Difficile by PCR: POSITIVE — AB

## 2021-11-20 LAB — C DIFFICILE QUICK SCREEN W PCR REFLEX
C Diff antigen: POSITIVE — AB
C Diff toxin: NEGATIVE

## 2021-11-20 MED ORDER — HEPARIN SODIUM (PORCINE) 1000 UNIT/ML DIALYSIS: 20.0000 [IU]/kg | INTRAMUSCULAR | Status: DC | PRN

## 2021-11-20 MED ORDER — TRAZODONE HCL 50 MG PO TABS: 50.0000 mg | ORAL_TABLET | Freq: Every evening | ORAL | Status: DC | PRN

## 2021-11-20 MED ORDER — HYDRALAZINE HCL 20 MG/ML IJ SOLN: 10.0000 mg | INTRAMUSCULAR | Status: DC | PRN

## 2021-11-20 MED ORDER — IPRATROPIUM-ALBUTEROL 20-100 MCG/ACT IN AERS
1.0000 | INHALATION_SPRAY | Freq: Four times a day (QID) | RESPIRATORY_TRACT | Status: DC
  Filled 2021-11-20: qty 4

## 2021-11-20 MED ORDER — VANCOMYCIN HCL 125 MG PO CAPS
125.0000 mg | ORAL_CAPSULE | Freq: Three times a day (TID) | ORAL | Status: DC
  Filled 2021-11-20 (×2): qty 1

## 2021-11-20 MED ORDER — METOPROLOL TARTRATE 5 MG/5ML IV SOLN: 5.0000 mg | INTRAVENOUS | Status: DC | PRN

## 2021-11-20 MED ORDER — DEXTROSE 50 % IV SOLN
25.0000 mL | Freq: Once | INTRAVENOUS | Status: AC
  Administered 2021-11-20: 25 mL via INTRAVENOUS
  Filled 2021-11-20: qty 50

## 2021-11-20 NOTE — Progress Notes (Signed)
?  Transition of Care (TOC) Screening Note ? ? ?Patient Details  ?Name: Mark [name] ?Date of Birth: [birth year] ? ? ?Transition of Care (TOC) CM/SW Contact:    ?Tom-Johnson, Renea Ee, RN ?Phone Number: ?11/20/2021, 12:59 PM ? ?Transition of Care Department [facility]) has reviewed patient and no TOC needs have been identified at this time. We will continue to monitor patient advancement through interdisciplinary progression rounds. If new patient transition needs arise, please place a TOC consult. ?  ?

## 2021-11-20 NOTE — Progress Notes (Signed)
?PROGRESS NOTE ? ? ? ?Mark [name]  RSW:[id number] DOB: Jan 05, 1951 DOA: 11/19/2021 ?PCP: [name], MD  ? ?Brief Narrative:  ?71 year old with history of diastolic CHF, ESRD on hemodialysis, DM 2, HTN, renal stones, seizure disorder, GERD comes to the hospital with complaints of generalized weakness, fatigue and diarrhea.  Patient was diagnosed with COVID-19 on 11/13/2021.  Upon admission he was also positive for C. difficile colitis.  On physical exam he was noted to have signs of volume overload due to missing dialysis with some electrolyte imbalance.  Nephrology team was consulted. ? ? ?Assessment & Plan: ? Principal Problem: ?  COVID-19 virus infection ?Active Problems: ?  C. difficile colitis ?  Fluid overload ?  Essential hypertension ?  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-stage renal disease) (Mark [name]) ?  Dyslipidemia ?  ? ? ?Assessment and Plan: ?* COVID-19 virus infection ?Has generalized weakness and diarrhea.   ?Started on molnupiravir by admitting provider; but I will dc it as his onset of symptoms and dx was >5 days ago. ?  Continue bronchodilators as needed.  Follow inflammatory markers. ? ? ?C. difficile colitis ?Patient is C. difficile positive.  Started on vancomycin for 10 days on 11/20/2021 ? ?Fluid overload ?Secondary to missing HD sessions.  Nephrology consulted for hemodialysis. ? ? ? ?Dyslipidemia ?Lipitor daily ? ?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-stage renal disease) (Mark [name]) ?Semglee 10 units at bedtime.  Sliding scale and Accu-Cheks ? ?Essential hypertension ?Continue daily coreg ? ? ? ? ?Repeat a.m. labs have been ordered due to confusion regarding if these labs were drawn prior to hemodialysis today. ? ? ? ?DVT prophylaxis: heparin injection 5,000 Units Start: 11/19/21 2200 ?Code Status: Full code ?Family Communication:   ? ?Maintain hospital stay until cleared by nephrology team. ? ?Nutritional status ? ? ? ?  ? ?  ? ?Body mass index is 32.72 kg/m?. ? ?   ? ? ? ? ? ?Subjective: ?Patient is very upset this morning and does not want to participate in conversation much.  He keeps on telling me that he has not been getting water he has been asking for but I do setting a water cup next to his bed.  Denies any chest pain and shortness of breath. ?When asked about diarrhea he states he has not had any diarrhea since this morning and wants to go home.  He says once he goes home he sure he will have diarrhea and may come back to the hospital.  He understands the risk of leaving [place]. ? ? ?Examination: ? ?General exam: Appears calm and comfortable  ?Respiratory system: Mild bibasilar crackles ?Cardiovascular system: S1 & S2 heard, RRR. No JVD, murmurs, rubs, gallops or clicks. No pedal edema. ?Gastrointestinal system: Abdomen is nondistended, soft and nontender. No organomegaly or masses felt. Normal bowel sounds heard. ?Central nervous system: Alert and oriented. No focal neurological deficits. ?Extremities: Symmetric 5 x 5 power. ?Skin: No rashes, lesions or ulcers ?Psychiatry: Judgement and insight appear normal. Mood & affect appropriate.  ? ?Left-sided below the knee amputation ? ?Objective: ?Vitals:  ? 11/20/21 0938 11/20/21 1829 11/20/21 9371 11/20/21 6967  ?BP: (!) 100/58 (!) 100/57 120/62 99/84  ?Pulse: 81 74 73 73  ?Resp:    18  ?Temp:   98.7 ?F (37.1 ?C) 98.9 ?F (37.2 ?C)  ?TempSrc:   Oral   ?SpO2: 94% 95% 96% 98%  ?Weight:      ?Height:      ? ? ?  Intake/Output Summary (Last 24 hours) at 11/20/2021 0826 ?Last data filed at 11/20/2021 6759 ?Gross per 24 hour  ?Intake --  ?Output 3200 ml  ?Net -3200 ml  ? ?Filed Weights  ? 11/19/21 2124 11/20/21 0211  ?Weight: 117.2 kg 115.6 kg  ? ? ? ?Data Reviewed:  ? ?CBC: ?Recent Labs  ?Lab 11/19/21 ?1411 11/20/21 ?0705  ?WBC 8.3 7.7  ?NEUTROABS 4.2 3.5  ?HGB 10.9* 10.5*  ?HCT 33.5* 31.2*  ?MCV 94.9 94.0  ?PLT 268 273  ? ?Basic Metabolic Panel: ?Recent Labs  ?Lab 11/19/21 ?1411 11/20/21 ?0705  ?NA 142 142  ?K 5.4*  5.3*  ?CL 105 105  ?CO2 17* 18*  ?GLUCOSE 77 131*  ?BUN 102* 108*  ?CREATININE 18.38* 19.03*  ?CALCIUM 8.8* 8.8*  ? ?GFR: ?Estimated Creatinine Clearance: 4.9 mL/min (A) (by C-G formula based on SCr of 19.03 mg/dL (H)). ?Liver Function Tests: ?Recent Labs  ?Lab 11/19/21 ?1411 11/20/21 ?0705  ?AST 19 14*  ?ALT 23 17  ?ALKPHOS 69 65  ?BILITOT 0.7 0.6  ?PROT 7.5 7.2  ?ALBUMIN 3.0* 2.9*  ? ?No results for input(s): LIPASE, AMYLASE in the last 168 hours. ?No results for input(s): AMMONIA in the last 168 hours. ?Coagulation Profile: ?No results for input(s): INR, PROTIME in the last 168 hours. ?Cardiac Enzymes: ?No results for input(s): CKTOTAL, CKMB, CKMBINDEX, TROPONINI in the last 168 hours. ?BNP (last 3 results) ?No results for input(s): PROBNP in the last 8760 hours. ?HbA1C: ?No results for input(s): HGBA1C in the last 72 hours. ?CBG: ?Recent Labs  ?Lab 11/19/21 ?2302 11/19/21 ?2344 11/20/21 ?0031 11/20/21 ?0729  ?GLUCAP 52* 66* 176* 80  ? ?Lipid Profile: ?No results for input(s): CHOL, HDL, LDLCALC, TRIG, CHOLHDL, LDLDIRECT in the last 72 hours. ?Thyroid Function Tests: ?No results for input(s): TSH, T4TOTAL, FREET4, T3FREE, THYROIDAB in the last 72 hours. ?Anemia Panel: ?Recent Labs  ?  11/19/21 ?2022  ?FERRITIN 1,328*  ? ?Sepsis Labs: ?Recent Labs  ?Lab 11/19/21 ?2022  ?PROCALCITON 0.76  ? ? ?Recent Results (from the past 240 hour(s))  ?Resp Panel by RT-PCR (Flu A&B, Covid) Nasopharyngeal Swab     Status: Abnormal  ? Collection Time: 11/13/21  7:43 AM  ? Specimen: Nasopharyngeal Swab; Nasopharyngeal(NP) swabs in vial transport medium  ?Result Value Ref Range Status  ? SARS Coronavirus 2 by RT PCR POSITIVE (A) NEGATIVE Final  ?  Comment: (NOTE) ?SARS-CoV-2 target nucleic acids are DETECTED. ? ?The SARS-CoV-2 RNA is generally detectable in upper respiratory ?specimens during the acute phase of infection. Positive results are ?indicative of the presence of the identified virus, but do not rule ?out bacterial  infection or co-infection with other pathogens not ?detected by the test. Clinical correlation with patient history and ?other diagnostic information is necessary to determine patient ?infection status. The expected result is Negative. ? ?Fact Sheet for Patients: ?EntrepreneurPulse.com.au ? ?Fact Sheet for Healthcare Providers: ?IncredibleEmployment.be ? ?This test is not yet approved or cleared by the [place] FDA and  ?has been authorized for detection and/or diagnosis of SARS-CoV-2 by ?FDA under an Emergency Use Authorization (EUA).  This EUA will ?remain in effect (meaning this test can be used) for the duration of  ?the COVID-19 declaration under Section 564(b)(1) of the A ct, 21 ?U.S.C. section 360bbb-3(b)(1), unless the authorization is ?terminated or revoked sooner. ? ?  ? Influenza A by PCR NEGATIVE NEGATIVE Final  ? Influenza B by PCR NEGATIVE NEGATIVE Final  ?  Comment: (NOTE) ?The Xpert Xpress SARS-CoV-2/FLU/RSV plus assay  is intended as an aid ?in the diagnosis of influenza from Nasopharyngeal swab specimens and ?should not be used as a sole basis for treatment. Nasal washings and ?aspirates are unacceptable for Xpert Xpress SARS-CoV-2/FLU/RSV ?testing. ? ?Fact Sheet for Patients: ?EntrepreneurPulse.com.au ? ?Fact Sheet for Healthcare Providers: ?IncredibleEmployment.be ? ?This test is not yet approved or cleared by the [place] FDA and ?has been authorized for detection and/or diagnosis of SARS-CoV-2 by ?FDA under an Emergency Use Authorization (EUA). This EUA will remain ?in effect (meaning this test can be used) for the duration of the ?COVID-19 declaration under Section 564(b)(1) of the Act, 21 U.S.C. ?section 360bbb-3(b)(1), unless the authorization is terminated or ?revoked. ? ?Performed at [facility], [address] ?[place], [place] [address] ?  ?C Difficile Quick Screen w PCR reflex     Status:  Abnormal  ? Collection Time: 11/19/21  7:18 PM  ? Specimen: STOOL  ?Result Value Ref Range Status  ? C Diff antigen POSITIVE (A) NEGATIVE Final  ? C Diff toxin NEGATIVE NEGATIVE Final  ? C Diff interpretation Results are indeter

## 2021-11-20 NOTE — Assessment & Plan Note (Signed)
Patient is C. difficile positive.  Started on vancomycin for 10 days on 11/20/2021 ?

## 2021-11-20 NOTE — Discharge Summary (Signed)
Physician Discharge Summary  ?Mark [name] IDP:[id number] DOB: [birth year] DOA: 11/19/2021 ? ?PCP: [name], MD ? ?Admit date: 11/19/2021 ?Discharge date: 11/20/2021 ? ?Admitted From: Home  ?Disposition: Left AGAINST MEDICAL ADVICE ? ?Brief/Interim Summary: ?71 year old with history of diastolic CHF, ESRD on hemodialysis, DM 2, HTN, renal stones, seizure disorder, GERD comes to the hospital with complaints of generalized weakness, fatigue and diarrhea.  Patient was diagnosed with COVID-19 on 11/13/2021.  Upon admission he was also positive for C. difficile colitis.  On physical exam he was noted to have signs of volume overload due to missing dialysis with some electrolyte imbalance.  Nephrology team was consulted.  Patient underwent a session of dialysis eventually after his HD he left AGAINST MEDICAL ADVICE. ?  ?  ? ? ?Assessment and Plan: ?* COVID-19 virus infection ?Has generalized weakness and diarrhea.   ?Started on molnupiravir by admitting provider; but I will dc it as his onset of symptoms and dx was >5 days ago. ?  Continue bronchodilators as needed.  Follow inflammatory markers. ? ? ?C. difficile colitis ?Patient is C. difficile positive.  Started on vancomycin for 10 days on 11/20/2021 ? ?Fluid overload ?Secondary to missing HD sessions.  Nephrology consulted for hemodialysis. ? ? ? ?Dyslipidemia ?Lipitor daily ? ?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-stage renal disease) ([place]) ?Semglee 10 units at bedtime.  Sliding scale and Accu-Cheks ? ?Essential hypertension ?Continue daily coreg ? ? ? ? ?  ?Body mass index is 32.72 kg/m?. ? ?  ? ? ? ?Discharge Diagnoses:  ?Principal Problem: ?  COVID-19 virus infection ?Active Problems: ?  C. difficile colitis ?  Fluid overload ?  Essential hypertension ?  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-stage renal disease) ([place]) ?  Dyslipidemia ? ? ? ? ? ?Consultations: ?Nephrology ? ?Subjective: ?Patient seen and examined in the morning but later left AGAINST  MEDICAL ADVICE ? ?Discharge Exam: ?Vitals:  ? 11/20/21 0712 11/20/21 0921  ?BP: 99/84 124/67  ?Pulse: 73 71  ?Resp: 18 16  ?Temp: 98.9 ?F (37.2 ?C) 98.9 ?F (37.2 ?C)  ?SpO2: 98% 94%  ? ?Vitals:  ? 11/20/21 4431 11/20/21 5400 11/20/21 8676 11/20/21 1950  ?BP: (!) 100/57 120/62 99/84 124/67  ?Pulse: 74 73 73 71  ?Resp:   18 16  ?Temp:  98.7 ?F (37.1 ?C) 98.9 ?F (37.2 ?C) 98.9 ?F (37.2 ?C)  ?TempSrc:  Oral  Oral  ?SpO2: 95% 96% 98% 94%  ?Weight:      ?Height:      ? ? ? ? ?Discharge Instructions ? ? ? ? Follow-up Information   ? ? [place], [place] to.   ?Why: Schedule is Tuesday/Thursday/Saturday. ?For patient's next appointment, pt needs to arrive at 8:30 for treatment due to covid diagnosis. ?Contact information: ?[address] ?[place] [address] ?[phone] ? ? ?  ?  ? ?  ?  ? ?  ? ?No Known Allergies ? ?You were cared for by a hospitalist during your hospital stay. If you have any questions about your discharge medications or the care you received while you were in the hospital after you are discharged, you can call the unit and asked to speak with the hospitalist on call if the hospitalist that took care of you is not available. Once you are discharged, your primary care physician will handle any further medical issues. Please note that no refills for any discharge medications will be authorized once you are discharged, as it  is imperative that you return to your primary care physician (or establish a relationship with a primary care physician if you do not have one) for your aftercare needs so that they can reassess your need for medications and monitor your lab values. ? ? ?Procedures/Studies: ?DG Chest 2 View ? ?Result Date: 11/19/2021 ?CLINICAL DATA:  Shortness of breath EXAM: CHEST - 2 VIEW COMPARISON:  11/13/2021 FINDINGS: Transverse diameter of heart is increased. There are no signs of alveolar pulmonary edema or new focal infiltrates. There is no pleural effusion or  pneumothorax. IMPRESSION: No active cardiopulmonary disease. Electronically Signed   By: Elmer Picker M.D.   On: 11/19/2021 16:53  ? ?DG Chest Port 1 View ? ?Result Date: 11/13/2021 ?CLINICAL DATA:  A 71 year old male presents for evaluation of cough and shortness of breath. EXAM: PORTABLE CHEST 1 VIEW COMPARISON:  September 02, 2021. FINDINGS: EKG leads project over the chest. Trachea midline. Cardiomediastinal contours and hilar structures are unchanged. Central pulmonary vascular congestion is noted. Graded opacity at the RIGHT and LEFT lung base with potential blunting of LEFT and RIGHT costodiaphragmatic sulcus. Overlapping soft tissues on this portable radiograph could also cause a similar appearance. No visible pneumothorax. No lobar consolidation. On limited assessment there is no acute skeletal process. IMPRESSION: 1. Central pulmonary vascular congestion. 2. Suspect graded opacity over the LEFT and RIGHT chest represents overlapping soft tissues on this portable radiograph due to large patient body habitus. Difficult to exclude small effusions. No lobar consolidation. Electronically Signed   By: Zetta Bills M.D.   On: 11/13/2021 08:15   ? ? ?The results of significant diagnostics from this hospitalization (including imaging, microbiology, ancillary and laboratory) are listed below for reference.   ? ? ?Microbiology: ?Recent Results (from the past 240 hour(s))  ?Resp Panel by RT-PCR (Flu A&B, Covid) Nasopharyngeal Swab     Status: Abnormal  ? Collection Time: 11/13/21  7:43 AM  ? Specimen: Nasopharyngeal Swab; Nasopharyngeal(NP) swabs in vial transport medium  ?Result Value Ref Range Status  ? SARS Coronavirus 2 by RT PCR POSITIVE (A) NEGATIVE Final  ?  Comment: (NOTE) ?SARS-CoV-2 target nucleic acids are DETECTED. ? ?The SARS-CoV-2 RNA is generally detectable in upper respiratory ?specimens during the acute phase of infection. Positive results are ?indicative of the presence of the identified virus,  but do not rule ?out bacterial infection or co-infection with other pathogens not ?detected by the test. Clinical correlation with patient history and ?other diagnostic information is necessary to determine patient ?infection status. The expected result is Negative. ? ?Fact Sheet for Patients: ?EntrepreneurPulse.com.au ? ?Fact Sheet for Healthcare Providers: ?IncredibleEmployment.be ? ?This test is not yet approved or cleared by the [place] FDA and  ?has been authorized for detection and/or diagnosis of SARS-CoV-2 by ?FDA under an Emergency Use Authorization (EUA).  This EUA will ?remain in effect (meaning this test can be used) for the duration of  ?the COVID-19 declaration under Section 564(b)(1) of the A ct, 21 ?U.S.C. section 360bbb-3(b)(1), unless the authorization is ?terminated or revoked sooner. ? ?  ? Influenza A by PCR NEGATIVE NEGATIVE Final  ? Influenza B by PCR NEGATIVE NEGATIVE Final  ?  Comment: (NOTE) ?The Xpert Xpress SARS-CoV-2/FLU/RSV plus assay is intended as an aid ?in the diagnosis of influenza from Nasopharyngeal swab specimens and ?should not be used as a sole basis for treatment. Nasal washings and ?aspirates are unacceptable for Xpert Xpress SARS-CoV-2/FLU/RSV ?testing. ? ?Fact Sheet for Patients: ?EntrepreneurPulse.com.au ? ?Fact Sheet for Healthcare  Providers: ?IncredibleEmployment.be ? ?This test is not yet approved or cleared by the [place] FDA and ?has been authorized for detection and/or diagnosis of SARS-CoV-2 by ?FDA under an Emergency Use Authorization (EUA). This EUA will remain ?in effect (meaning this test can be used) for the duration of the ?COVID-19 declaration under Section 564(b)(1) of the Act, 21 U.S.C. ?section 360bbb-3(b)(1), unless the authorization is terminated or ?revoked. ? ?Performed at [facility], [address] ?[place], [place] [address] ?  ?C Difficile Quick  Screen w PCR reflex     Status: Abnormal  ? Collection Time: 11/19/21  7:18 PM  ? Specimen: STOOL  ?Result Value Ref Range Status  ? C Diff antigen POSITIVE (A) NEGATIVE Final  ? C Diff toxin NEGATIVE NEGATI

## 2021-11-20 NOTE — Progress Notes (Signed)
Inpatient Diabetes Program Recommendations ? ?AACE/ADA: New Consensus Statement on Inpatient Glycemic Control (2015) ? ?Target Ranges:  Prepandial:   less than 140 mg/dL ?     Peak postprandial:   less than 180 mg/dL (1-2 hours) ?     Critically ill patients:  140 - 180 mg/dL  ? ?Lab Results  ?Component Value Date  ? GLUCAP 84 11/20/2021  ? HGBA1C 8.5 (H) 08/31/2021  ? ? ?Diabetes history: Type 2 DM ?Outpatient Diabetes medications: Lantus 20 units QHS ?Current orders for Inpatient glycemic control: Semglee 10 units QHS, Novolog 0-6 units TID. ? ?Inpatient Diabetes Program Recommendations:   ? ?Noted hypoglycemia yesterday of 52 mg/dL. Has not had basal insulin since admission.  ?At this time, consider discontinuing Semglee 10 units QHS. Secure chat sent to MD.  ? ?Thanks, ?Bronson Curb, MSN, RNC-OB ?Diabetes Coordinator ?[phone] (8a-5p) ? ? ? ?

## 2021-11-20 NOTE — Progress Notes (Signed)
Hypoglycemic Event ? ?CBG: 55 mg/dL '@23'$ :08  ? ?Treatment: 4 oz juice/soda, half sandwich ? ?Symptoms: pt. verbalized doesn't feel good, weak ? ?Follow-up CBG: Time: 23:46 CBG Result:66 mg/dL ? ?Treatment: 1/2 amp D50 79m ? ?Repeat CBG result: 176 mg/dL '@00'$ :32 ? ?Possible Reasons for Event: Inadequate meal intake ? ?Comments/MD notified: Yes ? ? ? ?JBabs [place]? ? ?

## 2021-11-20 NOTE — Progress Notes (Addendum)
Pt receives out-pt HD at [facility] on TTS. Contacted clinic to make them aware that pt is covid positive and cdiff positive. Spoke to [place], Agricultural consultant who states that the next appt at clinic, pt will need to arrive at 8:30 to receive treatment due to above diagnosis. Unable to reach pt via phone to discuss this information. Spoke to pt's wife via phone to make her aware. Will add time change to AVS as well. Will notify clinic of d/c date once known.  ? ?Melven Sartorius ?Renal Navigator ?[phone] ? ?Addendum at 3:28 pm: ?Advised by pt's RN earlier this afternoon that pt left AMA. Contacted [facility] and spoke to [place]. Clinic advised of the above. RN advised pt of his treatment time for tomorrow prior to pt's leaving. Navigator spoke to pt's wife this morning regarding pt's change in time.  ?

## 2021-11-20 NOTE — Progress Notes (Signed)
Off unit. Patient went to Hemodialysis ?

## 2021-11-20 NOTE — Progress Notes (Signed)
Heart Failure Navigator Progress Note ? ?Assessed for Heart & Vascular TOC clinic readiness.  ?Patient does not meet criteria due to HD patient..  ? ?  ? ?Earnestine Leys, BSN, RN ?Heart Failure Nurse Navigator ?[phone]   ?

## 2021-11-20 NOTE — Progress Notes (Signed)
New Admission Note:  ? 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   ?Family: none at bedside ? ?Orders to be reviewed and implemented. Will continue to monitor the patient. Call light has been placed within reach and bed alarm has been activated.  ? ?

## 2021-11-20 NOTE — Progress Notes (Signed)
Patient requested to leave AMA, MD notified and renal navigator.   ?

## 2021-11-21 ENCOUNTER — Telehealth (HOSPITAL_COMMUNITY): Payer: Self-pay | Admitting: Nephrology

## 2021-11-21 ENCOUNTER — Encounter: Payer: Self-pay | Admitting: Internal Medicine

## 2021-11-21 LAB — HEPATITIS B SURFACE ANTIBODY, QUANTITATIVE: Hep B S AB Quant (Post): 3.5 m[IU]/mL — ABNORMAL LOW (ref >9.9)

## 2021-11-21 NOTE — Progress Notes (Signed)
I have reviewed the results of hepatitis B chemistry done during patient's recent hospitalization.  He is not adequately immunized against hepatitis B.  We will offer hepatitis B vaccination on next visit. ?

## 2021-11-21 NOTE — Telephone Encounter (Cosign Needed)
Transition of care contact from inpatient facility ? ?Date of Discharge: 11/20/21 ?Date of Contact: 11/21/21 - attempted ?Method of contact: Phone ? ?Attempted to contact patient to discuss transition of care from inpatient admission. Patient did not answer the phone. Message was left on the patient's voicemail with call back number [phone].  ? ?Then, called his outpatient HD unit to see if he showed up today - he DID NOT. Disc with HD RN to look out for him as I am unsure if he knows he will need antibiotics for his C.Diff and that I can send the order if he didn't get them when he left AMA. ? ?Veneta Penton, PA-C ?[facility] ?Pager [phone] ? ?

## 2021-11-23 ENCOUNTER — Telehealth: Payer: Self-pay

## 2021-11-23 NOTE — Telephone Encounter (Signed)
Transition Care Management Follow-up Telephone Call ?Date of discharge and from where: 11/20/2021, [facility]  - left AMA ?How have you been since you were released from the hospital? He said he is feeling about the same as when he left the hospital.  He spoke for a short time and then said that he didn't feel like talking today and hung up.  ?Any questions or concerns? Yes- he is scheduled for HD: T/T/S at [facility] but said he can't get there without transportation and he can't get transportation with COVID. He was diagnosed with COVID 11/13/2021. I asked him how he was getting to HD before and then asked if he discussed this concern with anyone and he said he was tired and hung up. ?I called Melven Sartorius, SW regarding transportation and she said she doesn't assist with that and referred me to his [place] # [phone]. I called and attempted to leave a message for Freda Jackson, SW but her voicemail was full. I then called back and spoke to [place], Publishing copy who said he would have their SW follow up with the patient regarding transportation.  ? ?Items Reviewed: ?Did the pt receive and understand the discharge instructions provided?  No AVS, he left AMA ?Medications obtained and verified?  No new medications ordered. He left AMA. He said he has all of the medications he was taking prior to his hospitalization ?Other? No  ?Any new allergies since your discharge? No  ?Dietary orders reviewed? No ?Do you have support at home?  His wife is with him but she currently has COVID ? ?Home Care and Equipment/Supplies: ?Were home health services ordered? no ?If so, what is the name of the agency? N/a  ?Has the agency set up a time to come to the patient's home? not applicable ?Were any new equipment or medical supplies ordered?  No ?What is the name of the medical supply agency? N/a ?Were you able to get the supplies/equipment? not applicable ?Do you have any questions related to the  use of the equipment or supplies? No ? ?Functional Questionnaire: (I = Independent and D = Dependent) ?ADLs: uses wheelchair for mobility. Independence with ADLs was not discussed.  ? ?Follow up appointments reviewed: ? ?PCP Hospital f/u appt confirmed?  He has an appointment with Dr Wynetta Emery  - 12/11/2021 but hung up prior to discussing the option of re-scheduling to be seen sooner    ?[facility] f/u appt confirmed?  Oncology - 01/13/2022.    ?Are transportation arrangements needed? Yes  - noted above ?If their condition worsens, is the pt aware to call PCP or go to the Emergency Dept.? Yes ?Was the patient provided with contact information for the PCP's office or ED? No - he hung up before this was discussed  ?Was to pt encouraged to call back with questions or concerns? No-he hung up before this was discussed  ? ?

## 2021-11-24 ENCOUNTER — Telehealth: Payer: Self-pay | Admitting: Internal Medicine

## 2021-11-24 DIAGNOSIS — R531 Weakness: Secondary | ICD-10-CM | POA: Diagnosis not present

## 2021-11-24 DIAGNOSIS — D472 Monoclonal gammopathy: Secondary | ICD-10-CM | POA: Diagnosis not present

## 2021-11-24 DIAGNOSIS — N186 End stage renal disease: Secondary | ICD-10-CM | POA: Diagnosis not present

## 2021-11-24 DIAGNOSIS — Z743 Need for continuous supervision: Secondary | ICD-10-CM | POA: Diagnosis not present

## 2021-11-24 DIAGNOSIS — R109 Unspecified abdominal pain: Secondary | ICD-10-CM | POA: Diagnosis not present

## 2021-11-24 DIAGNOSIS — R6889 Other general symptoms and signs: Secondary | ICD-10-CM | POA: Diagnosis not present

## 2021-11-24 DIAGNOSIS — Z992 Dependence on renal dialysis: Secondary | ICD-10-CM | POA: Diagnosis not present

## 2021-11-24 DIAGNOSIS — R5383 Other fatigue: Secondary | ICD-10-CM | POA: Diagnosis not present

## 2021-11-24 DIAGNOSIS — D509 Iron deficiency anemia, unspecified: Secondary | ICD-10-CM | POA: Diagnosis not present

## 2021-11-24 DIAGNOSIS — D688 Other specified coagulation defects: Secondary | ICD-10-CM | POA: Diagnosis not present

## 2021-11-24 DIAGNOSIS — N2581 Secondary hyperparathyroidism of renal origin: Secondary | ICD-10-CM | POA: Diagnosis not present

## 2021-11-24 NOTE — Telephone Encounter (Signed)
Copied from [place] [phone]. Topic: General - Inquiry >> Nov 24, 2021  9:56 AM [place], Nelva Bush wrote: Reason for CRM:Pt stated he needs new equipment for his wheelchair stated needs new tires.   Pt has an upcoming appointment on 12/11/2021.  Pt requested a call back.

## 2021-11-24 NOTE — Telephone Encounter (Signed)
Will forward to provider  

## 2021-11-25 NOTE — Telephone Encounter (Signed)
Contacted pt to go over provider response pt is aware and doesn't have any questions or concerns  

## 2021-11-26 DIAGNOSIS — Z992 Dependence on renal dialysis: Secondary | ICD-10-CM | POA: Diagnosis not present

## 2021-11-26 DIAGNOSIS — R531 Weakness: Secondary | ICD-10-CM | POA: Diagnosis not present

## 2021-11-26 DIAGNOSIS — Z743 Need for continuous supervision: Secondary | ICD-10-CM | POA: Diagnosis not present

## 2021-11-26 DIAGNOSIS — D688 Other specified coagulation defects: Secondary | ICD-10-CM | POA: Diagnosis not present

## 2021-11-26 DIAGNOSIS — U071 COVID-19: Secondary | ICD-10-CM | POA: Diagnosis not present

## 2021-11-26 DIAGNOSIS — R6889 Other general symptoms and signs: Secondary | ICD-10-CM | POA: Diagnosis not present

## 2021-11-26 DIAGNOSIS — N2581 Secondary hyperparathyroidism of renal origin: Secondary | ICD-10-CM | POA: Diagnosis not present

## 2021-11-26 DIAGNOSIS — D472 Monoclonal gammopathy: Secondary | ICD-10-CM | POA: Diagnosis not present

## 2021-11-26 DIAGNOSIS — N186 End stage renal disease: Secondary | ICD-10-CM | POA: Diagnosis not present

## 2021-11-27 DIAGNOSIS — N186 End stage renal disease: Secondary | ICD-10-CM | POA: Diagnosis not present

## 2021-11-27 DIAGNOSIS — E1129 Type 2 diabetes mellitus with other diabetic kidney complication: Secondary | ICD-10-CM | POA: Diagnosis not present

## 2021-11-27 DIAGNOSIS — Z992 Dependence on renal dialysis: Secondary | ICD-10-CM | POA: Diagnosis not present

## 2021-11-28 DIAGNOSIS — D631 Anemia in chronic kidney disease: Secondary | ICD-10-CM | POA: Diagnosis not present

## 2021-11-28 DIAGNOSIS — Z992 Dependence on renal dialysis: Secondary | ICD-10-CM | POA: Diagnosis not present

## 2021-11-28 DIAGNOSIS — G8929 Other chronic pain: Secondary | ICD-10-CM | POA: Diagnosis not present

## 2021-11-28 DIAGNOSIS — Z743 Need for continuous supervision: Secondary | ICD-10-CM | POA: Diagnosis not present

## 2021-11-28 DIAGNOSIS — D472 Monoclonal gammopathy: Secondary | ICD-10-CM | POA: Diagnosis not present

## 2021-11-28 DIAGNOSIS — R531 Weakness: Secondary | ICD-10-CM | POA: Diagnosis not present

## 2021-11-28 DIAGNOSIS — D688 Other specified coagulation defects: Secondary | ICD-10-CM | POA: Diagnosis not present

## 2021-11-28 DIAGNOSIS — N186 End stage renal disease: Secondary | ICD-10-CM | POA: Diagnosis not present

## 2021-11-28 DIAGNOSIS — N2581 Secondary hyperparathyroidism of renal origin: Secondary | ICD-10-CM | POA: Diagnosis not present

## 2021-12-01 DIAGNOSIS — Z743 Need for continuous supervision: Secondary | ICD-10-CM | POA: Diagnosis not present

## 2021-12-01 DIAGNOSIS — R6889 Other general symptoms and signs: Secondary | ICD-10-CM | POA: Diagnosis not present

## 2021-12-01 DIAGNOSIS — N186 End stage renal disease: Secondary | ICD-10-CM | POA: Diagnosis not present

## 2021-12-01 DIAGNOSIS — R531 Weakness: Secondary | ICD-10-CM | POA: Diagnosis not present

## 2021-12-01 DIAGNOSIS — Z992 Dependence on renal dialysis: Secondary | ICD-10-CM | POA: Diagnosis not present

## 2021-12-01 DIAGNOSIS — N2581 Secondary hyperparathyroidism of renal origin: Secondary | ICD-10-CM | POA: Diagnosis not present

## 2021-12-01 DIAGNOSIS — D472 Monoclonal gammopathy: Secondary | ICD-10-CM | POA: Diagnosis not present

## 2021-12-01 DIAGNOSIS — U071 COVID-19: Secondary | ICD-10-CM | POA: Diagnosis not present

## 2021-12-01 DIAGNOSIS — D631 Anemia in chronic kidney disease: Secondary | ICD-10-CM | POA: Diagnosis not present

## 2021-12-01 DIAGNOSIS — D688 Other specified coagulation defects: Secondary | ICD-10-CM | POA: Diagnosis not present

## 2021-12-03 DIAGNOSIS — N186 End stage renal disease: Secondary | ICD-10-CM | POA: Diagnosis not present

## 2021-12-03 DIAGNOSIS — R531 Weakness: Secondary | ICD-10-CM | POA: Diagnosis not present

## 2021-12-03 DIAGNOSIS — Z743 Need for continuous supervision: Secondary | ICD-10-CM | POA: Diagnosis not present

## 2021-12-03 DIAGNOSIS — D688 Other specified coagulation defects: Secondary | ICD-10-CM | POA: Diagnosis not present

## 2021-12-03 DIAGNOSIS — Z992 Dependence on renal dialysis: Secondary | ICD-10-CM | POA: Diagnosis not present

## 2021-12-03 DIAGNOSIS — D472 Monoclonal gammopathy: Secondary | ICD-10-CM | POA: Diagnosis not present

## 2021-12-03 DIAGNOSIS — D631 Anemia in chronic kidney disease: Secondary | ICD-10-CM | POA: Diagnosis not present

## 2021-12-03 DIAGNOSIS — N2581 Secondary hyperparathyroidism of renal origin: Secondary | ICD-10-CM | POA: Diagnosis not present

## 2021-12-05 DIAGNOSIS — Z743 Need for continuous supervision: Secondary | ICD-10-CM | POA: Diagnosis not present

## 2021-12-05 DIAGNOSIS — D472 Monoclonal gammopathy: Secondary | ICD-10-CM | POA: Diagnosis not present

## 2021-12-05 DIAGNOSIS — D631 Anemia in chronic kidney disease: Secondary | ICD-10-CM | POA: Diagnosis not present

## 2021-12-05 DIAGNOSIS — N2581 Secondary hyperparathyroidism of renal origin: Secondary | ICD-10-CM | POA: Diagnosis not present

## 2021-12-05 DIAGNOSIS — N186 End stage renal disease: Secondary | ICD-10-CM | POA: Diagnosis not present

## 2021-12-05 DIAGNOSIS — D688 Other specified coagulation defects: Secondary | ICD-10-CM | POA: Diagnosis not present

## 2021-12-05 DIAGNOSIS — Z992 Dependence on renal dialysis: Secondary | ICD-10-CM | POA: Diagnosis not present

## 2021-12-05 DIAGNOSIS — R109 Unspecified abdominal pain: Secondary | ICD-10-CM | POA: Diagnosis not present

## 2021-12-08 DIAGNOSIS — Z743 Need for continuous supervision: Secondary | ICD-10-CM | POA: Diagnosis not present

## 2021-12-08 DIAGNOSIS — D472 Monoclonal gammopathy: Secondary | ICD-10-CM | POA: Diagnosis not present

## 2021-12-08 DIAGNOSIS — R531 Weakness: Secondary | ICD-10-CM | POA: Diagnosis not present

## 2021-12-08 DIAGNOSIS — D631 Anemia in chronic kidney disease: Secondary | ICD-10-CM | POA: Diagnosis not present

## 2021-12-08 DIAGNOSIS — Z992 Dependence on renal dialysis: Secondary | ICD-10-CM | POA: Diagnosis not present

## 2021-12-08 DIAGNOSIS — N2581 Secondary hyperparathyroidism of renal origin: Secondary | ICD-10-CM | POA: Diagnosis not present

## 2021-12-08 DIAGNOSIS — N186 End stage renal disease: Secondary | ICD-10-CM | POA: Diagnosis not present

## 2021-12-08 DIAGNOSIS — D688 Other specified coagulation defects: Secondary | ICD-10-CM | POA: Diagnosis not present

## 2021-12-11 ENCOUNTER — Ambulatory Visit
Admission: RE | Admit: 2021-12-11 | Discharge: 2021-12-11 | Disposition: A | Discharge status: Home / Self Care | Payer: Medicare Other | Source: Ambulatory Visit | Attending: Internal Medicine | Admitting: Internal Medicine

## 2021-12-11 ENCOUNTER — Encounter: Payer: Self-pay | Admitting: Internal Medicine

## 2021-12-11 ENCOUNTER — Ambulatory Visit: Payer: Medicare Other | Attending: Internal Medicine | Admitting: Internal Medicine

## 2021-12-11 VITALS — BP 108/63 | HR 70 | Resp 16 | Wt 256.4 lb

## 2021-12-11 DIAGNOSIS — Z794 Long term (current) use of insulin: Secondary | ICD-10-CM | POA: Diagnosis not present

## 2021-12-11 DIAGNOSIS — Z09 Encounter for follow-up examination after completed treatment for conditions other than malignant neoplasm: Secondary | ICD-10-CM

## 2021-12-11 DIAGNOSIS — E1159 Type 2 diabetes mellitus with other circulatory complications: Secondary | ICD-10-CM | POA: Diagnosis not present

## 2021-12-11 DIAGNOSIS — Z8616 Personal history of COVID-19: Secondary | ICD-10-CM

## 2021-12-11 DIAGNOSIS — Z7409 Other reduced mobility: Secondary | ICD-10-CM

## 2021-12-11 DIAGNOSIS — M25561 Pain in right knee: Secondary | ICD-10-CM | POA: Diagnosis not present

## 2021-12-11 DIAGNOSIS — Z8619 Personal history of other infectious and parasitic diseases: Secondary | ICD-10-CM

## 2021-12-11 LAB — GLUCOSE, POCT (MANUAL RESULT ENTRY): POC Glucose: 139 mg/dl — AB (ref 70–99)

## 2021-12-11 LAB — POCT GLYCOSYLATED HEMOGLOBIN (HGB A1C): HbA1c, POC (controlled diabetic range): 7.6 % — AB (ref 0.0–7.0)

## 2021-12-11 MED ORDER — ACETAMINOPHEN ER 650 MG PO TBCR: 650.0000 mg | EXTENDED_RELEASE_TABLET | Freq: Two times a day (BID) | ORAL | 1 refills | Status: AC | PRN

## 2021-12-11 NOTE — Progress Notes (Signed)
? ? ?Patient ID: Mark [name], male    DOB: Mar 21, 1951  MRN: [id number] ? ?CC: Hospitalization Follow-up ? ? ?Subjective: ?Mark [name] is a 71 y.o. male who presents for hospital follow-up. 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 ? ?Patient hospitalized 3/23-24/2023 with complaints of generalized weakness, fatigue and diarrhea.  He was diagnosed with COVID-19 infection the week prior.  He was found to have C. difficile.  He was started on vancomycin.  Patient signed out [place] after his dialysis session. ? ?Today: ?I do not see vancomycin on his discharge medication list but patient tells me he has completed the course of vancomycin and the diarrhea has resolved. ?-Requesting COVID-19 test.  He has to test negative before he can continue to use the medical [facility] for transport to and from dialysis sessions.  Had 3 COVID tests already through his dialysis center but the first 2 had to be discarded.  Reports being told that the third test was "positive negative."  Neither he or I know what this means. ?-Cough has resolved.  Still trying to regain his strength from having had COVID.  I have to COVID-19 vaccine administration documented in his health maintenance.  He thinks he may have received a booster. ? ?C/O right knee pain which is chronic but worse in over the past 1-1/2 weeks.  [place] out of his motorized wheelchair 1-1/2 weeks ago while descending a ramp.  The chair suddenly gave out, he was not wearing the seatbelt and as a result fell out of the chair landing on his right knee.  Knee is swollen and feels tight.  Uncomfortable putting weight on the knee.  He has a rod in the right upper leg that extends to just above the knee from previous right hip surgery.  He has had to use his standard  walker since the chair gave out. ?-He has contacted the company from which he purchased the chair to have them send someone out to evaluate it.  He thinks the motor gave out and that it will need a new set of titers. ? ?DM: Blood sugars were fluctuating up and down during the time that he had COVID infection.  Oral intake at that time was poor.  Reports blood sugars are just now getting back to normal range.  Checks 2 times a day before breakfast and dinner.  Gives a range of 130-144.  Most recent A1c was 7.6.  Taking Lantus 20 units. ? ?I note that during hospitalization they checked hepatitis B antibody levels which revealed he was not adequately immunized. ? ? ? ?Patient Active Problem List  ? Diagnosis Date Noted  ? C. difficile colitis 11/20/2021  ? COVID-19 virus infection 11/19/2021  ? Fluid overload 11/19/2021  ? Dyslipidemia 11/19/2021  ? Abrasion, right lower leg, initial encounter 11/03/2021  ? Hx of BKA, left ([place]) 09/21/2021  ? Metabolic bone disease 77/41/2878  ? Pneumonia of left lower lobe due to infectious organism 08/31/2021  ? Acute back pain 08/31/2021  ? Mixed diabetic hyperlipidemia associated with type 2 diabetes mellitus ([place]) 08/31/2021  ? PAD (peripheral artery disease) ([place]) 02/03/2021  ? ESRD on hemodialysis ([place]) 11/26/2020  ? Glaucoma suspect 04/30/2020  ? NPDR (nonproliferative diabetic retinopathy) ([place]) 03/18/2020  ?  Anemia due to stage 5 chronic kidney disease, not on chronic dialysis ([place]) 06/22/2019  ? 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  ? Hip fracture ([place]) 01/01/2017  ? Impingement syndrome of right shoulder 10/25/2016  ? Incisional hernia 07/14/2016  ? IDA (iron deficiency anemia) 03/08/2016  ? Seizure disorder ([place]) 01/01/2016  ? History of Clostridium difficile colitis 01/01/2016  ? Positive for microalbuminuria 08/18/2015  ? CKD (chronic kidney disease) stage 3, GFR 30-59 ml/min (HCC) 08/18/2015  ? GERD (gastroesophageal reflux disease)  04/30/2015  ? Tinea pedis 04/30/2015  ? Onychomycosis of toenail 04/30/2015  ? 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-stage renal disease) ([place]) 04/16/2015  ? Chronic diastolic CHF (congestive heart failure) ([place]) 10/18/2014  ? Essential hypertension 04/08/2014  ? Anemia of chronic disease 10/01/2013  ?  ? ?Current Outpatient Medications on File Prior to Visit  ?Medication Sig Dispense Refill  ? atorvastatin (LIPITOR) 20 MG tablet Take 1 tablet (20 mg total) by mouth daily. 90 tablet 1  ? benzonatate (TESSALON) 100 MG capsule Take 1 capsule (100 mg total) by mouth every 8 (eight) hours. (Patient taking differently: Take 100 mg by mouth every 8 (eight) hours as needed for cough.) 21 capsule 0  ? Blood Glucose Monitoring Suppl (ONETOUCH VERIO) w/Device KIT Use as directed to test blood sugar three times daily. 1 kit 0  ? carvedilol (COREG) 25 MG tablet Take 25 mg by mouth 2 (two) times daily.    ? carvedilol (COREG) 3.125 MG tablet Take 1 tablet (3.125 mg total) by mouth 2 (two) times daily. (Patient not taking: Reported on 11/13/2021) 60 tablet 11  ? furosemide (LASIX) 80 MG tablet Take 1 tablet (80 mg total) by mouth daily. (Patient taking differently: Take 80 mg by mouth 2 (two) times daily.) 60 tablet 3  ? glucose blood (ONETOUCH VERIO) test strip USE AS DIRECTED THREE TIMES DAILY TO  TEST  BLOOD  SUGAR 100 each 11  ? insulin glargine (LANTUS SOLOSTAR) 100 UNIT/ML Solostar Pen Inject 20 Units into the skin at bedtime. 15 mL 3  ? Insulin Pen Needle (TRUEPLUS 5-BEVEL PEN NEEDLES) 32G X 4 MM MISC Use to administer Lantus once daily. 100 each 2  ? Insulin Syringe-Needle U-100 (INSULIN SYRINGE .5CC/30GX5/16") 30G X 5/16" 0.5 ML MISC Check blood sugar TID & QHS 100 each 2  ? lanthanum (FOSRENOL) 1000 MG chewable tablet Chew 1,000 mg by mouth daily.    ? midodrine (PROAMATINE) 5 MG tablet Take 5 mg by mouth in the morning and at bedtime. Do not takes in bp is lower 130/80    ? Multiple Vitamin (MULTIVITAMIN WITH  MINERALS) TABS tablet Take 1 tablet by mouth daily.    ? mupirocin ointment (BACTROBAN) 2 % Apply 1 application topically 2 (two) times daily. 22 g 0  ? ondansetron (ZOFRAN-ODT) 4 MG disintegrating tablet 68m ODT q4 hours prn nausea/vomit 20 tablet 0  ? OneTouch Delica Lancets [id number] Use as directed to test blood sugar three times daily. 100 each 12  ? VITAMIN D PO Take 1 capsule by mouth daily.    ? ?No current facility-administered medications on file prior to visit.  ? ? ?No Known Allergies ? ?Social History  ? ?Socioeconomic History  ? Marital status: Married  ?  Spouse name: ADeneise Lever ? Number of children: 1  ? Years of education: Some college  ? Highest education level: Not on file  ?Occupational History  ? Occupation: Mows grass  ?Tobacco Use  ?  Smoking status: Former  ?  Packs/day: 1.00  ?  Years: 10.00  ?  Pack years: 10.00  ?  Types: Cigarettes  ?  Quit date: 08/03/2013  ?  Years since quitting: 8.3  ? Smokeless tobacco: Never  ?Vaping Use  ? Vaping Use: Never used  ?Substance and Sexual Activity  ? Alcohol use: No  ? Drug use: Not Currently  ?  Types: Cocaine  ?  Comment: last time 2015  ? Sexual activity: Not on file  ?Other Topics Concern  ? Not on file  ?Social History Narrative  ? Lives with his wife, [name]  ? Ad[facility]08[facility]  ? Full Code  ? Right-handed  ? Caffeine: none currently  ? ?Social Determinants of Health  ? ?Financial Resource Strain: Not on file  ?Food Insecurity: Not on file  ?Transportation Needs: Not on file  ?Physical Activity: Not on file  ?Stress: Not on file  ?Social Connections: Not on file  ?Intimate Partner Violence: Not on file  ? ? ?Family History  ?Problem Relation Age of Onset  ? Diabetes Mother   ? Cancer Mother   ? Cancer Father   ? Diabetes Father   ? Heart disease Father   ? Diabetes Sister   ? Diabetes Sister   ? Cancer Brother   ? ? ?Past Surgical History:  ?Procedure Laterality Date  ? AMPUTATION Left 10/02/2013  ? Procedure: Repeat irrigation and  debridement left foot, left 3rd toe amputation;  Surgeon: Mcarthur Rossetti, MD;  Location: [facility];  Service: Orthopedics;  Laterality: Left;  ? AMPUTATION Left 11/06/2013  ? Procedure: LEFT FOOT TRANSMETATARSAL AMPUTATION ;

## 2021-12-12 ENCOUNTER — Telehealth: Payer: Self-pay | Admitting: Internal Medicine

## 2021-12-12 DIAGNOSIS — Z8616 Personal history of COVID-19: Secondary | ICD-10-CM | POA: Diagnosis not present

## 2021-12-12 LAB — NOVEL CORONAVIRUS, NAA: SARS-CoV-2, NAA: NOT DETECTED

## 2021-12-13 ENCOUNTER — Telehealth: Payer: Self-pay | Admitting: Internal Medicine

## 2021-12-13 NOTE — Telephone Encounter (Signed)
Phone call placed to patient this morning.  Patient informed that the x-ray of his right knee was negative for fracture but showed significant amount of degenerative arthritis and a moderate size joint effusion.  I have referred him to orthopedics and will send a message to our referral coordinator to see if they can get him in as soon as possible.  COVID test was negative.  Patient states he will need a copy of the lab results to show it to medical transportation.  He plans to come by the office tomorrow to pick up a copy.  Message sent to my medical assistant to let her know that patient will be coming by. ?

## 2021-12-14 NOTE — Telephone Encounter (Signed)
Contacted pt and lvm also sent pt a MyChart message  ?

## 2021-12-14 NOTE — Telephone Encounter (Signed)
Patient asking for a call back to schedule appointment with [facility]  ?

## 2021-12-14 NOTE — Telephone Encounter (Signed)
Can we put this patient on my schedule for HepB vaccine?  ?

## 2021-12-15 DIAGNOSIS — N2581 Secondary hyperparathyroidism of renal origin: Secondary | ICD-10-CM | POA: Diagnosis not present

## 2021-12-15 DIAGNOSIS — D631 Anemia in chronic kidney disease: Secondary | ICD-10-CM | POA: Diagnosis not present

## 2021-12-15 DIAGNOSIS — Z992 Dependence on renal dialysis: Secondary | ICD-10-CM | POA: Diagnosis not present

## 2021-12-15 DIAGNOSIS — D688 Other specified coagulation defects: Secondary | ICD-10-CM | POA: Diagnosis not present

## 2021-12-15 DIAGNOSIS — D472 Monoclonal gammopathy: Secondary | ICD-10-CM | POA: Diagnosis not present

## 2021-12-15 DIAGNOSIS — N186 End stage renal disease: Secondary | ICD-10-CM | POA: Diagnosis not present

## 2021-12-16 ENCOUNTER — Encounter (HOSPITAL_COMMUNITY): Payer: Self-pay | Admitting: *Deleted

## 2021-12-17 DIAGNOSIS — D472 Monoclonal gammopathy: Secondary | ICD-10-CM | POA: Diagnosis not present

## 2021-12-17 DIAGNOSIS — N186 End stage renal disease: Secondary | ICD-10-CM | POA: Diagnosis not present

## 2021-12-17 DIAGNOSIS — D688 Other specified coagulation defects: Secondary | ICD-10-CM | POA: Diagnosis not present

## 2021-12-17 DIAGNOSIS — Z992 Dependence on renal dialysis: Secondary | ICD-10-CM | POA: Diagnosis not present

## 2021-12-17 DIAGNOSIS — N2581 Secondary hyperparathyroidism of renal origin: Secondary | ICD-10-CM | POA: Diagnosis not present

## 2021-12-17 DIAGNOSIS — D631 Anemia in chronic kidney disease: Secondary | ICD-10-CM | POA: Diagnosis not present

## 2021-12-19 DIAGNOSIS — N186 End stage renal disease: Secondary | ICD-10-CM | POA: Diagnosis not present

## 2021-12-19 DIAGNOSIS — D631 Anemia in chronic kidney disease: Secondary | ICD-10-CM | POA: Diagnosis not present

## 2021-12-19 DIAGNOSIS — D472 Monoclonal gammopathy: Secondary | ICD-10-CM | POA: Diagnosis not present

## 2021-12-19 DIAGNOSIS — Z992 Dependence on renal dialysis: Secondary | ICD-10-CM | POA: Diagnosis not present

## 2021-12-19 DIAGNOSIS — D688 Other specified coagulation defects: Secondary | ICD-10-CM | POA: Diagnosis not present

## 2021-12-19 DIAGNOSIS — N2581 Secondary hyperparathyroidism of renal origin: Secondary | ICD-10-CM | POA: Diagnosis not present

## 2021-12-21 ENCOUNTER — Ambulatory Visit: Payer: Medicare Other | Attending: Internal Medicine

## 2021-12-21 ENCOUNTER — Encounter: Payer: Self-pay | Admitting: Orthopedic Surgery

## 2021-12-21 ENCOUNTER — Ambulatory Visit (INDEPENDENT_AMBULATORY_CARE_PROVIDER_SITE_OTHER): Payer: Medicare Other | Admitting: Orthopedic Surgery

## 2021-12-21 DIAGNOSIS — M25461 Effusion, right knee: Secondary | ICD-10-CM

## 2021-12-21 DIAGNOSIS — Z23 Encounter for immunization: Secondary | ICD-10-CM | POA: Diagnosis not present

## 2021-12-21 DIAGNOSIS — Z89512 Acquired absence of left leg below knee: Secondary | ICD-10-CM

## 2021-12-21 MED ORDER — METHYLPREDNISOLONE ACETATE 40 MG/ML IJ SUSP
40.0000 mg | INTRAMUSCULAR | Status: AC | PRN
  Administered 2021-12-21: 40 mg via INTRA_ARTICULAR

## 2021-12-21 MED ORDER — LIDOCAINE HCL (PF) 1 % IJ SOLN
5.0000 mL | INTRAMUSCULAR | Status: AC | PRN
  Administered 2021-12-21: 5 mL

## 2021-12-21 NOTE — Progress Notes (Signed)
? ?Office Visit Note ?  ?Patient: Mark [name]           ?Date of Birth: 12-07-1950           ?MRN: [id number] ?Visit Date: 12/21/2021 ?             ?Requested by: [name], MD ?[address] ?[address] ?[place],  [place] [address] ?PCP: [name], MD ? ?Chief Complaint  ?Patient presents with  ? Right Knee - Pain  ? ? ? ? ?HPI: ?Patient is a 71 year old gentleman who presents with chronic right knee pain which is gotten worse over the last 2 to 3 weeks.  He is status post a left transtibial amputation.  Previous radiographs of the right knee show osteoarthritis with bone-on-bone contact the lateral joint line.  Patient is pain is worse over the lateral joint line he does have retained femoral nail and has calcification of the popliteal vessels. ? ?Assessment & Plan: ?Visit Diagnoses:  ?1. Hx of BKA, left ([place])   ?2. Effusion, right knee   ? ? ?Plan: The knee was aspirated he tolerated this well he is currently subsiding into the socket on the left he is given a under liner shrinker to wear he is to stop using his prosthesis and follow-up in 1 week to evaluate the popliteal ulcer and the right knee swelling.  Patient will need a new socket for the left transtibial amputation. ? ?Follow-Up Instructions: Return in about 1 week (around 12/28/2021).  ? ?Ortho Exam ? ?Patient is alert, oriented, no adenopathy, well-dressed, normal affect, normal respiratory effort. ?Examination patient has a large blister in the popliteal fossa left knee from subsiding into his socket.  The ulcer is superficial and a sock was applied there is no cellulitis no drainage no signs of infection.  Patient has a tense effusion of the right knee.  This was aspirated from the superior lateral portal with clear synovial fluid. ? ?Imaging: ?No results found. ? ? ?Labs: ?Lab Results  ?Component Value Date  ? HGBA1C 7.6 (A) 12/11/2021  ? HGBA1C 8.5 (H) 08/31/2021  ? HGBA1C 7.6 (A) 06/17/2021  ? ESRSEDRATE 75 (H) 06/12/2021  ?  ESRSEDRATE 126 (H) 08/31/2017  ? CRP 15.7 (H) 11/20/2021  ? CRP 16.0 (H) 11/19/2021  ? CRP 19 (H) 06/12/2021  ? REPTSTATUS 08/31/2021 FINAL 08/31/2021  ? REPTSTATUS 09/03/2021 FINAL 08/31/2021  ? GRAMSTAIN  08/31/2021  ?  ABUNDANT WBC PRESENT,BOTH PMN AND MONONUCLEAR ?FEW GRAM POSITIVE COCCI ?RARE GRAM NEGATIVE RODS ?RARE GRAM POSITIVE RODS ?  ? CULT  08/31/2021  ?  FEW Normal respiratory flora-no Staph aureus or Pseudomonas seen ?Performed at [facility], [address], [place], [place] [address] ?  ? LABORGA ENTEROCOCCUS FAECALIS (A) 04/11/2021  ? ? ? ?Lab Results  ?Component Value Date  ? ALBUMIN 2.9 (L) 11/20/2021  ? ALBUMIN 3.0 (L) 11/19/2021  ? ALBUMIN 3.4 (L) 10/13/2021  ? PREALBUMIN 10.1 (L) 08/31/2017  ? ? ?Lab Results  ?Component Value Date  ? MG 1.8 09/01/2021  ? MG 1.8 08/31/2017  ? MG 1.9 01/02/2016  ? ?No results found for: VD25OH ? ?Lab Results  ?Component Value Date  ? PREALBUMIN 10.1 (L) 08/31/2017  ? ? ?  Latest Ref Rng & Units 11/20/2021  ?  7:05 AM 11/19/2021  ?  2:11 PM 11/13/2021  ?  7:43 AM  ?CBC EXTENDED  ?WBC 4.0 - 10.5 K/uL 7.7   8.3   10.7    ?  RBC 4.22 - 5.81 MIL/uL 3.32   3.53   3.44    ?Hemoglobin 13.0 - 17.0 g/dL 10.5   10.9   10.8    ?HCT 39.0 - 52.0 % 31.2   33.5   33.1    ?Platelets 150 - 400 K/uL 273   268   359    ?NEUT# 1.7 - 7.7 K/uL 3.5   4.2   5.3    ?Lymph# 0.7 - 4.0 K/uL 3.2   3.2   3.4    ? ? ? ?There is no height or weight on file to calculate BMI. ? ?Orders:  ?No orders of the defined types were placed in this encounter. ? ?No orders of the defined types were placed in this encounter. ? ? ? Procedures: ?Large Joint Inj: R knee on 12/21/2021 6:37 PM ?Indications: pain and diagnostic evaluation ?Details: 22 G 1.5 in needle, superolateral approach ? ?Arthrogram: No ? ?Medications: 5 mL lidocaine (PF) 1 %; 40 mg methylPREDNISolone acetate 40 MG/ML ?Aspirate: 60 mL clear and yellow ?Outcome: tolerated well, no immediate complications ?Procedure, treatment alternatives,  risks and benefits explained, specific risks discussed. Consent was given by the patient. Immediately prior to procedure a time out was called to verify the correct patient, procedure, equipment, support staff and site/side marked as required. Patient was prepped and draped in the usual sterile fashion.  ? ? ? ?Clinical Data: ?No additional findings. ? ?ROS: ? ?All other systems negative, except as noted in the HPI. ?Review of Systems ? ?Objective: ?Vital Signs: There were no vitals taken for this visit. ? ?Specialty Comments:  ?No specialty comments available. ? ?PMFS History: ?Patient Active Problem List  ? Diagnosis Date Noted  ? C. difficile colitis 11/20/2021  ? COVID-19 virus infection 11/19/2021  ? Fluid overload 11/19/2021  ? Dyslipidemia 11/19/2021  ? Abrasion, right lower leg, initial encounter 11/03/2021  ? Hx of BKA, left ([place]) 09/21/2021  ? Metabolic bone disease 95/28/4132  ? Pneumonia of left lower lobe due to infectious organism 08/31/2021  ? Acute back pain 08/31/2021  ? Mixed diabetic hyperlipidemia associated with type 2 diabetes mellitus ([place]) 08/31/2021  ? PAD (peripheral artery disease) ([place]) 02/03/2021  ? ESRD on hemodialysis ([place]) 11/26/2020  ? Glaucoma suspect 04/30/2020  ? NPDR (nonproliferative diabetic retinopathy) ([place]) 03/18/2020  ? Anemia due to stage 5 chronic kidney disease, not on chronic dialysis ([place]) 06/22/2019  ? 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  ? Hip fracture ([place]) 01/01/2017  ? Impingement syndrome of right shoulder 10/25/2016  ? Incisional hernia 07/14/2016  ? IDA (iron deficiency anemia) 03/08/2016  ? Seizure disorder ([place]) 01/01/2016  ? History of Clostridium difficile colitis 01/01/2016  ? Positive for microalbuminuria 08/18/2015  ? CKD (chronic kidney disease) stage 3, GFR 30-59 ml/min (HCC) 08/18/2015  ? GERD (gastroesophageal reflux disease) 04/30/2015  ? Tinea pedis 04/30/2015  ? Onychomycosis of toenail 04/30/2015  ? Type 2 diabetes  mellitus with ESRD (end-stage renal disease) ([place]) 04/16/2015  ? Chronic diastolic CHF (congestive heart failure) ([place]) 10/18/2014  ? Essential hypertension 04/08/2014  ? Anemia of chronic disease 10/01/2013  ? ?Past Medical History:  ?Diagnosis Date  ? Acid indigestion   ? Acute encephalopathy 01/01/2016  ? Acute renal failure superimposed on stage 3 chronic kidney disease ([place]) 04/16/2015  ? Anemia 10/01/2013  ? Arthritis   ? Bursitis   ? CHF (congestive heart failure) ([place])   ? Chronic kidney disease   ? dialysis T-Th-Sat  ? CKD (chronic kidney  disease) stage 3, GFR 30-59 ml/min (HCC) 08/18/2015  ? Diabetes mellitus, type 2 ([place]) 04/16/2015  ? Diarrhea   ? chronic  ? Diverticulitis   ? DM (diabetes mellitus), type 2 with peripheral vascular complications ([place])   ? right  leg  ? Elevated troponin 10/16/2014  ? Essential hypertension 04/08/2014  ? History of Clostridium difficile colitis 01/01/2016  ? History of kidney stones   ? passed x 2  ? Hypertension   ? no pcp  ? Hypothermia 01/01/2016  ? Malnutrition of moderate degree ([place]) 04/17/2015  ? Multiple myeloma ([place])   ? Onychomycosis of toenail 04/30/2015  ? Phantom limb pain ([place]) 12/12/2013  ? left bka  ? Pneumonia 2020  ? in hosp 08/2021  ? Positive for microalbuminuria 08/18/2015  ? S/P BKA (below knee amputation) ([place]) 11/21/2013  ? 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    ? Seizure ([place]) 2015  ? 2015- "using drugs"  had seizure as a child , none after age 69- did not know what caused the seizures  ? Seizures ([place])   ? Spleen absent   ? Substance abuse ([place]) 04/02/2016  ? Cocaine  ? Wound infection 01/02/2016  ?  ?Family History  ?Problem Relation Age of Onset  ? Diabetes Mother   ? Cancer Mother   ? Cancer Father   ? Diabetes Father   ? Heart disease Father   ? Diabetes Sister   ? Diabetes Sister   ? Cancer Brother   ?  ?Past Surgical History:  ?Procedure Laterality Date  ? AMPUTATION Left 10/02/2013  ? Procedure: Repeat irrigation and debridement left foot, left 3rd  toe amputation;  Surgeon: Mcarthur Rossetti, MD;  Location: [facility];  Service: Orthopedics;  Laterality: Left;  ? AMPUTATION Left 11/06/2013  ? Procedure: LEFT FOOT TRANSMETATARSAL AMPUTATION ;  Surgeon: Sula Soda

## 2021-12-22 ENCOUNTER — Ambulatory Visit: Payer: Commercial Managed Care - HMO | Admitting: Pharmacist

## 2021-12-23 ENCOUNTER — Emergency Department (HOSPITAL_COMMUNITY)
Admission: EM | Admit: 2021-12-23 | Discharge: 2021-12-24 | Discharge status: Against Medical Advice | Payer: Medicare Other | Attending: Emergency Medicine | Admitting: Emergency Medicine

## 2021-12-23 ENCOUNTER — Emergency Department (HOSPITAL_COMMUNITY): Payer: Medicare Other

## 2021-12-23 ENCOUNTER — Other Ambulatory Visit: Payer: Self-pay

## 2021-12-23 ENCOUNTER — Encounter (HOSPITAL_COMMUNITY): Payer: Self-pay | Admitting: Emergency Medicine

## 2021-12-23 DIAGNOSIS — R1031 Right lower quadrant pain: Secondary | ICD-10-CM | POA: Diagnosis not present

## 2021-12-23 DIAGNOSIS — Z743 Need for continuous supervision: Secondary | ICD-10-CM | POA: Diagnosis not present

## 2021-12-23 DIAGNOSIS — Z992 Dependence on renal dialysis: Secondary | ICD-10-CM | POA: Insufficient documentation

## 2021-12-23 DIAGNOSIS — K5730 Diverticulosis of large intestine without perforation or abscess without bleeding: Secondary | ICD-10-CM | POA: Diagnosis not present

## 2021-12-23 DIAGNOSIS — R1111 Vomiting without nausea: Secondary | ICD-10-CM | POA: Diagnosis not present

## 2021-12-23 DIAGNOSIS — R112 Nausea with vomiting, unspecified: Secondary | ICD-10-CM | POA: Insufficient documentation

## 2021-12-23 DIAGNOSIS — R110 Nausea: Secondary | ICD-10-CM | POA: Diagnosis not present

## 2021-12-23 DIAGNOSIS — R109 Unspecified abdominal pain: Secondary | ICD-10-CM | POA: Diagnosis not present

## 2021-12-23 DIAGNOSIS — K8020 Calculus of gallbladder without cholecystitis without obstruction: Secondary | ICD-10-CM | POA: Diagnosis not present

## 2021-12-23 DIAGNOSIS — Z5321 Procedure and treatment not carried out due to patient leaving prior to being seen by health care provider: Secondary | ICD-10-CM | POA: Insufficient documentation

## 2021-12-23 DIAGNOSIS — N281 Cyst of kidney, acquired: Secondary | ICD-10-CM | POA: Diagnosis not present

## 2021-12-23 DIAGNOSIS — N1330 Unspecified hydronephrosis: Secondary | ICD-10-CM | POA: Diagnosis not present

## 2021-12-23 DIAGNOSIS — R300 Dysuria: Secondary | ICD-10-CM | POA: Diagnosis not present

## 2021-12-23 NOTE — ED Notes (Signed)
Unable to get blood triage. PT stuck multiple times ?

## 2021-12-23 NOTE — ED Triage Notes (Signed)
Per EMS, pt has right sided flank pain that goes down to his testicle, dysuria and N/V that started at 5pm today.  Pt missed dialysis Tuesday due to another Dr. Vertis Kelch, last dialysis was Saturday.   ? ?HR 84 ?94% RA ?CBG 177 ?

## 2021-12-23 NOTE — ED Provider Triage Note (Signed)
Emergency Medicine Provider Triage Evaluation Note ? ?[name] , a 71 y.o. male  was evaluated in triage.  Pt complains of dysuria, nausea, vomiting, and flank pain.  Patient reports that today at approximately 5 PM patient started having right-sided flank pain, nausea, and vomiting.  Patient states that pain radiates down to his right lower quadrant and into his right testicle. ? ?Patient is a dialysis patient goes Tuesday, Thursday, Saturday.  Last dialysis session was Saturday.  Patient has been on since April of last year. ? ?Patient denies any fever, chills, constipation, diarrhea, blood in stool, melena, swelling to testicles. ? ? ?Review of Systems  ?Positive: Dysuria, nausea, vomiting, flank pain ?Negative: See above ? ?Physical Exam  ?BP (!) 206/108 (BP Location: Right Arm)   Pulse 81   Temp 98.6 ?F (37 ?C) (Oral)   Resp 16   Ht '6\' 2"'$  (1.88 m)   Wt 116.3 kg   SpO2 97%   BMI 32.92 kg/m?  ?Gen:   Awake, no distress   ?Resp:  Normal effort  ?MSK:   Moves extremities without difficulty  ?Other:  Abdomen soft, protuberant, tenderness to right lower quadrant.  Right CVA tenderness.  No guarding or rebound tenderness ? ?Medical Decision Making  ?Medically screening exam initiated at 8:50 PM.  Appropriate orders placed.  [name] was informed that the remainder of the evaluation will be completed by another provider, this initial triage assessment does not replace that evaluation, and the importance of remaining in the ED until their evaluation is complete. ? ?Per EMS CBG 177 ? ?With right flank pain concern for possible renal calculus.  With patient having testicular pain will also obtain ultrasound imaging to evaluate for torsion. ?  ?Loni Beckwith, PA-C ?12/23/21 2058 ? ?

## 2021-12-24 NOTE — ED Notes (Signed)
Patient states "yall aint doing sh*t, im leaving" ?

## 2021-12-26 DIAGNOSIS — D631 Anemia in chronic kidney disease: Secondary | ICD-10-CM | POA: Diagnosis not present

## 2021-12-26 DIAGNOSIS — N186 End stage renal disease: Secondary | ICD-10-CM | POA: Diagnosis not present

## 2021-12-26 DIAGNOSIS — D688 Other specified coagulation defects: Secondary | ICD-10-CM | POA: Diagnosis not present

## 2021-12-26 DIAGNOSIS — Z992 Dependence on renal dialysis: Secondary | ICD-10-CM | POA: Diagnosis not present

## 2021-12-26 DIAGNOSIS — N2581 Secondary hyperparathyroidism of renal origin: Secondary | ICD-10-CM | POA: Diagnosis not present

## 2021-12-26 DIAGNOSIS — D472 Monoclonal gammopathy: Secondary | ICD-10-CM | POA: Diagnosis not present

## 2021-12-27 DIAGNOSIS — E1129 Type 2 diabetes mellitus with other diabetic kidney complication: Secondary | ICD-10-CM | POA: Diagnosis not present

## 2021-12-27 DIAGNOSIS — N186 End stage renal disease: Secondary | ICD-10-CM | POA: Diagnosis not present

## 2021-12-27 DIAGNOSIS — Z992 Dependence on renal dialysis: Secondary | ICD-10-CM | POA: Diagnosis not present

## 2021-12-29 DIAGNOSIS — D631 Anemia in chronic kidney disease: Secondary | ICD-10-CM | POA: Diagnosis not present

## 2021-12-29 DIAGNOSIS — N2581 Secondary hyperparathyroidism of renal origin: Secondary | ICD-10-CM | POA: Diagnosis not present

## 2021-12-29 DIAGNOSIS — D688 Other specified coagulation defects: Secondary | ICD-10-CM | POA: Diagnosis not present

## 2021-12-29 DIAGNOSIS — N186 End stage renal disease: Secondary | ICD-10-CM | POA: Diagnosis not present

## 2021-12-29 DIAGNOSIS — D472 Monoclonal gammopathy: Secondary | ICD-10-CM | POA: Diagnosis not present

## 2021-12-29 DIAGNOSIS — Z992 Dependence on renal dialysis: Secondary | ICD-10-CM | POA: Diagnosis not present

## 2021-12-29 DIAGNOSIS — D509 Iron deficiency anemia, unspecified: Secondary | ICD-10-CM | POA: Diagnosis not present

## 2021-12-30 ENCOUNTER — Telehealth: Payer: Self-pay | Admitting: Hematology

## 2021-12-30 ENCOUNTER — Ambulatory Visit: Payer: Self-pay | Admitting: *Deleted

## 2021-12-30 ENCOUNTER — Telehealth: Payer: Self-pay | Admitting: Internal Medicine

## 2021-12-30 ENCOUNTER — Ambulatory Visit (INDEPENDENT_AMBULATORY_CARE_PROVIDER_SITE_OTHER): Payer: Medicare Other | Admitting: Family

## 2021-12-30 DIAGNOSIS — M25461 Effusion, right knee: Secondary | ICD-10-CM

## 2021-12-30 DIAGNOSIS — Z89512 Acquired absence of left leg below knee: Secondary | ICD-10-CM | POA: Diagnosis not present

## 2021-12-30 NOTE — Telephone Encounter (Signed)
I called the patient # [phone] regarding his concerns about the power chair. Message left with call back requested to this CM. ? ?Per Dr Durenda Age note for his office visit 12/11/2021, she instructed him to contact the company that provided his power chair and request that they come out to assess it.   ?

## 2021-12-30 NOTE — Telephone Encounter (Signed)
Left message with rescheduled upcoming appointment due to provider's breast clinic. 

## 2021-12-30 NOTE — Telephone Encounter (Signed)
Pt called very upset about not hearing anything about a power chair/ he stated that Dr. Wynetta Emery and him had a discussion over a month ago / pt hung up angry and stated if she cant do it he doesnt need her as a DR anymore / please advise  ?

## 2021-12-30 NOTE — Telephone Encounter (Signed)
fyi

## 2021-12-30 NOTE — Telephone Encounter (Signed)
Pt called in saying he was returning a call.    ?Reason for Disposition ? [1] Caller requesting NON-URGENT health information AND [2] PCP's office is the best resource ? ?Answer Assessment - Initial Assessment Questions ?1. REASON FOR CALL or QUESTION: "What is your reason for calling today?" or "How can I best help you?" or "What question do you have that I can help answer?" ?    I read pt the message from Dr. Wynetta Emery from [place] [facility] note that he needed to call the company that provided the power chair and request they come out and assess it. ? ?Pt repeatedly said,  "The doctor has to call them".   "I've called them and they won't do anything until the doctor calls  them".    Then he stated,   "Never mind".    "I'll just leave it parked".     ? ?I let him  know I would pass the request along to Dr. Wynetta Emery.  He hung up. ? ?Protocols used: Information Only Call - No Triage-A-AH ? ?

## 2021-12-30 NOTE — Telephone Encounter (Signed)
?  Chief Complaint: Pt called company about his power chair.   Pt states,   "The doctor has to call the company about the power chair".   "They won't do anything about it until the doctor calls".   See documentation note ?Symptoms: N/A ?Frequency: N/A ?Pertinent Negatives: Patient denies N/A ?Disposition: '[]'$ ED /'[]'$ Urgent Care (no appt availability in office) / '[]'$ Appointment(In office/virtual)/ '[]'$  [facility]/ '[]'$ Home Care/ '[]'$ Refused Recommended Disposition /'[]'$ [facility]/ '[x]'$  Follow-up with PCP ?Additional Notes:   ?

## 2021-12-30 NOTE — Telephone Encounter (Signed)
Routing to dr Wynetta Emery , not my pcp pt ?

## 2021-12-31 DIAGNOSIS — D688 Other specified coagulation defects: Secondary | ICD-10-CM | POA: Diagnosis not present

## 2021-12-31 DIAGNOSIS — D509 Iron deficiency anemia, unspecified: Secondary | ICD-10-CM | POA: Diagnosis not present

## 2021-12-31 DIAGNOSIS — N2581 Secondary hyperparathyroidism of renal origin: Secondary | ICD-10-CM | POA: Diagnosis not present

## 2021-12-31 DIAGNOSIS — N186 End stage renal disease: Secondary | ICD-10-CM | POA: Diagnosis not present

## 2021-12-31 DIAGNOSIS — Z992 Dependence on renal dialysis: Secondary | ICD-10-CM | POA: Diagnosis not present

## 2021-12-31 DIAGNOSIS — D472 Monoclonal gammopathy: Secondary | ICD-10-CM | POA: Diagnosis not present

## 2021-12-31 DIAGNOSIS — D631 Anemia in chronic kidney disease: Secondary | ICD-10-CM | POA: Diagnosis not present

## 2021-12-31 NOTE — Telephone Encounter (Signed)
I called the patient regarding his powerchair and he  said he received it from [facility]. ? ?I called [employer] # [phone], spoke to [employer].  He explained that the patient needs new wheels on the chair and his insurance company will only pay for wheels once every 12 months, no exceptions,  and he is not eligible for new wheels until 02/12/2022. He stated that the insurance company is very strict about this and nothing is needed from the doctor. The patient would need to pay out of pocket if he wants new wheels prior to 02/12/2022.  ? ?I called patient back and explained the above noted information from [facility] and he said " okay."  ?

## 2022-01-04 ENCOUNTER — Ambulatory Visit (HOSPITAL_COMMUNITY)
Admission: RE | Admit: 2022-01-04 | Discharge: 2022-01-04 | Disposition: A | Discharge status: Home / Self Care | Payer: Medicare Other | Source: Ambulatory Visit | Attending: Hematology | Admitting: Hematology

## 2022-01-04 DIAGNOSIS — C9000 Multiple myeloma not having achieved remission: Secondary | ICD-10-CM | POA: Diagnosis not present

## 2022-01-04 DIAGNOSIS — D472 Monoclonal gammopathy: Secondary | ICD-10-CM | POA: Insufficient documentation

## 2022-01-06 ENCOUNTER — Inpatient Hospital Stay: Payer: Medicare Other | Attending: Internal Medicine

## 2022-01-06 DIAGNOSIS — D472 Monoclonal gammopathy: Secondary | ICD-10-CM | POA: Insufficient documentation

## 2022-01-12 ENCOUNTER — Inpatient Hospital Stay (HOSPITAL_BASED_OUTPATIENT_CLINIC_OR_DEPARTMENT_OTHER): Payer: Medicare Other | Admitting: Hematology

## 2022-01-12 ENCOUNTER — Encounter: Payer: Self-pay | Admitting: Family

## 2022-01-12 ENCOUNTER — Other Ambulatory Visit: Payer: Self-pay

## 2022-01-12 ENCOUNTER — Telehealth: Payer: Self-pay

## 2022-01-12 ENCOUNTER — Inpatient Hospital Stay: Payer: Medicare Other

## 2022-01-12 VITALS — BP 143/94 | HR 61 | Temp 98.0°F | Resp 18

## 2022-01-12 DIAGNOSIS — D472 Monoclonal gammopathy: Secondary | ICD-10-CM

## 2022-01-12 LAB — CBC WITH DIFFERENTIAL (CANCER CENTER ONLY)
Abs Immature Granulocytes: 0.03 10*3/uL (ref 0.00–0.07)
Basophils Absolute: 0.1 10*3/uL (ref 0.0–0.1)
Basophils Relative: 1 %
Eosinophils Absolute: 0.3 10*3/uL (ref 0.0–0.5)
Eosinophils Relative: 4 %
HCT: 23.6 % — ABNORMAL LOW (ref 39.0–52.0)
Hemoglobin: 7.9 g/dL — ABNORMAL LOW (ref 13.0–17.0)
Immature Granulocytes: 0 %
Lymphocytes Relative: 31 %
Lymphs Abs: 2.4 10*3/uL (ref 0.7–4.0)
MCH: 30.7 pg (ref 26.0–34.0)
MCHC: 33.5 g/dL (ref 30.0–36.0)
MCV: 91.8 fL (ref 80.0–100.0)
Monocytes Absolute: 0.6 10*3/uL (ref 0.1–1.0)
Monocytes Relative: 8 %
Neutro Abs: 4.2 10*3/uL (ref 1.7–7.7)
Neutrophils Relative %: 56 %
Platelet Count: 346 10*3/uL (ref 150–400)
RBC: 2.57 MIL/uL — ABNORMAL LOW (ref 4.22–5.81)
RDW: 16.5 % — ABNORMAL HIGH (ref 11.5–15.5)
WBC Count: 7.6 10*3/uL (ref 4.0–10.5)
nRBC: 0 % (ref 0.0–0.2)

## 2022-01-12 LAB — CMP (CANCER CENTER ONLY)
ALT: <5 U/L (ref 0–44)
AST: <5 U/L — ABNORMAL LOW (ref 15–41)
Albumin: 3.8 g/dL (ref 3.5–5.0)
Alkaline Phosphatase: 64 U/L (ref 38–126)
Anion gap: 17 — ABNORMAL HIGH (ref 5–15)
BUN: 160 mg/dL — ABNORMAL HIGH (ref 8–23)
CO2: 14 mmol/L — ABNORMAL LOW (ref 22–32)
Calcium: 9.6 mg/dL (ref 8.9–10.3)
Chloride: 112 mmol/L — ABNORMAL HIGH (ref 98–111)
Creatinine: 23.36 mg/dL (ref 0.61–1.24)
GFR, Estimated: 2 mL/min — ABNORMAL LOW (ref >60)
Glucose, Bld: 118 mg/dL — ABNORMAL HIGH (ref 70–99)
Potassium: 6.6 mmol/L (ref 3.5–5.1)
Sodium: 143 mmol/L (ref 135–145)
Total Bilirubin: 0.4 mg/dL (ref 0.3–1.2)
Total Protein: 7.6 g/dL (ref 6.5–8.1)

## 2022-01-12 NOTE — Progress Notes (Signed)
? ?Office Visit Note ?  ?Patient: Mark [name]           ?Date of Birth: 11/25/1950           ?MRN: [id number] ?Visit Date: 12/30/2021 ?             ?Requested by: Johnson, Deborah B, MD ?[address] ?[address] ?[place],  [place] [address] ?PCP: Johnson, Deborah B, MD ? ?Chief Complaint  ?Patient presents with  ? Right Knee - Follow-up  ? ? ? ? ?HPI: ?The patient is a 71-year-old gentleman who presents today in follow-up for right knee pain.  He was last seen in the office on April 24 at this point he had an aspiration and injection which did provide him with some relief.  However, today he continues to have some mild to moderate swelling which is concerning to him ? ?Does have a history of severe degenerative changes to the right knee, osteoarthritis. ? ?He is status post a left transtibial amputation.  Previous radiographs of the right knee show osteoarthritis with bone-on-bone contact the lateral joint line.   ? ?Patient is pain is worse over the lateral joint line he does have retained femoral nail and has calcification of the popliteal vessels. ? ?The popliteal ulcer to his left knee is improved since using the under liner and minimizing his use of his prosthetic ? ?Assessment & Plan: ?Visit Diagnoses:  ?No diagnosis found. ? ? ?Plan: Reassurance of the right knee provided.  He will continue with his current wound care for his left knee continue with the medical compression shrinker with direct skin contact. ? ?Follow-Up Instructions: No follow-ups on file.  ? ?Ortho Exam ? ?Patient is alert, oriented, no adenopathy, well-dressed, normal affect, normal respiratory effort. ?Examination patient has an ulcer in the popliteal fossa left knee from subsiding into his socket.  The ulcer is superficial and a sock was reapplied there is no cellulitis no drainage no signs of infection.   ? ?Imaging: ?No results found. ? ? ?Labs: ?Lab Results  ?Component Value Date  ? HGBA1C 7.6 (A) 12/11/2021  ? HGBA1C 8.5 (H)  08/31/2021  ? HGBA1C 7.6 (A) 06/17/2021  ? ESRSEDRATE 75 (H) 06/12/2021  ? ESRSEDRATE 126 (H) 08/31/2017  ? CRP 15.7 (H) 11/20/2021  ? CRP 16.0 (H) 11/19/2021  ? CRP 19 (H) 06/12/2021  ? REPTSTATUS 08/31/2021 FINAL 08/31/2021  ? REPTSTATUS 09/03/2021 FINAL 08/31/2021  ? GRAMSTAIN  08/31/2021  ?  ABUNDANT WBC PRESENT,BOTH PMN AND MONONUCLEAR ?FEW GRAM POSITIVE COCCI ?RARE GRAM NEGATIVE RODS ?RARE GRAM POSITIVE RODS ?  ? CULT  08/31/2021  ?  FEW Normal respiratory flora-no Staph aureus or Pseudomonas seen ?Performed at [facility], [address] [place], [place] [address] ?  ? LABORGA ENTEROCOCCUS FAECALIS (A) 04/11/2021  ? ? ? ?Lab Results  ?Component Value Date  ? ALBUMIN 2.9 (L) 11/20/2021  ? ALBUMIN 3.0 (L) 11/19/2021  ? ALBUMIN 3.4 (L) 10/13/2021  ? PREALBUMIN 10.1 (L) 08/31/2017  ? ? ?Lab Results  ?Component Value Date  ? MG 1.8 09/01/2021  ? MG 1.8 08/31/2017  ? MG 1.9 01/02/2016  ? ?No results found for: VD25OH ? ?Lab Results  ?Component Value Date  ? PREALBUMIN 10.1 (L) 08/31/2017  ? ? ?  Latest Ref Rng & Units 11/20/2021  ?  7:05 AM 11/19/2021  ?  2:11 PM 11/13/2021  ?  7:43 AM  ?CBC EXTENDED  ?WBC 4.0 - 10.5 K/uL 7.7     8.3   10.7    ?RBC 4.22 - 5.81 MIL/uL 3.32   3.53   3.44    ?Hemoglobin 13.0 - 17.0 g/dL 10.5   10.9   10.8    ?HCT 39.0 - 52.0 % 31.2   33.5   33.1    ?Platelets 150 - 400 K/uL 273   268   359    ?NEUT# 1.7 - 7.7 K/uL 3.5   4.2   5.3    ?Lymph# 0.7 - 4.0 K/uL 3.2   3.2   3.4    ? ? ? ?There is no height or weight on file to calculate BMI. ? ?Orders:  ?No orders of the defined types were placed in this encounter. ? ?No orders of the defined types were placed in this encounter. ? ? ? Procedures: ?No procedures performed ? ?Clinical Data: ?No additional findings. ? ?ROS: ? ?All other systems negative, except as noted in the HPI. ?Review of Systems ? ?Objective: ?Vital Signs: There were no vitals taken for this visit. ? ?Specialty Comments:  ?No specialty comments available. ? ?PMFS  History: ?Patient Active Problem List  ? Diagnosis Date Noted  ? C. difficile colitis 11/20/2021  ? COVID-19 virus infection 11/19/2021  ? Fluid overload 11/19/2021  ? Dyslipidemia 11/19/2021  ? Abrasion, right lower leg, initial encounter 11/03/2021  ? Hx of BKA, left ([place]) 09/21/2021  ? Metabolic bone disease 84/69/6295  ? Pneumonia of left lower lobe due to infectious organism 08/31/2021  ? Acute back pain 08/31/2021  ? Mixed diabetic hyperlipidemia associated with type 2 diabetes mellitus ([place]) 08/31/2021  ? PAD (peripheral artery disease) ([place]) 02/03/2021  ? ESRD on hemodialysis ([place]) 11/26/2020  ? Glaucoma suspect 04/30/2020  ? NPDR (nonproliferative diabetic retinopathy) ([place]) 03/18/2020  ? Anemia due to stage 5 chronic kidney disease, not on chronic dialysis ([name]) 06/22/2019  ? 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  ? Hip fracture ([place]) 01/01/2017  ? Impingement syndrome of right shoulder 10/25/2016  ? Incisional hernia 07/14/2016  ? IDA (iron deficiency anemia) 03/08/2016  ? Seizure disorder ([place]) 01/01/2016  ? History of Clostridium difficile colitis 01/01/2016  ? Positive for microalbuminuria 08/18/2015  ? CKD (chronic kidney disease) stage 3, GFR 30-59 ml/min (HCC) 08/18/2015  ? GERD (gastroesophageal reflux disease) 04/30/2015  ? Tinea pedis 04/30/2015  ? Onychomycosis of toenail 04/30/2015  ? 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-stage renal disease) ([place]) 04/16/2015  ? Chronic diastolic CHF (congestive heart failure) ([place]) 10/18/2014  ? Essential hypertension 04/08/2014  ? Anemia of chronic disease 10/01/2013  ? ?Past Medical History:  ?Diagnosis Date  ? Acid indigestion   ? Acute encephalopathy 01/01/2016  ? Acute renal failure superimposed on stage 3 chronic kidney disease ([place]) 04/16/2015  ? Anemia 10/01/2013  ? Arthritis   ? Bursitis   ? CHF (congestive heart failure) ([place])   ? Chronic kidney disease   ? dialysis T-Th-Sat  ? CKD (chronic kidney disease) stage 3, GFR  30-59 ml/min (HCC) 08/18/2015  ? Diabetes mellitus, type 2 ([place]) 04/16/2015  ? Diarrhea   ? chronic  ? Diverticulitis   ? DM (diabetes mellitus), type 2 with peripheral vascular complications ([place])   ? right  leg  ? Elevated troponin 10/16/2014  ? Essential hypertension 04/08/2014  ? History of Clostridium difficile colitis 01/01/2016  ? History of kidney stones   ? passed x 2  ? Hypertension   ? no pcp  ? Hypothermia 01/01/2016  ? Malnutrition of moderate degree (  [place]) 04/17/2015  ? Multiple myeloma ([place])   ? Onychomycosis of toenail 04/30/2015  ? Phantom limb pain ([place]) 12/12/2013  ? left bka  ? Pneumonia 2020  ? in hosp 08/2021  ? Positive for microalbuminuria 08/18/2015  ? S/P BKA (below knee amputation) ([place]) 11/21/2013  ? 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    ? Seizure ([place]) 2015  ? 2015- "using drugs"  had seizure as a child , none after age 79- did not know what caused the seizures  ? Seizures ([place])   ? Spleen absent   ? Substance abuse ([place]) 04/02/2016  ? Cocaine  ? Wound infection 01/02/2016  ?  ?Family History  ?Problem Relation Age of Onset  ? Diabetes Mother   ? Cancer Mother   ? Cancer Father   ? Diabetes Father   ? Heart disease Father   ? Diabetes Sister   ? Diabetes Sister   ? Cancer Brother   ?  ?Past Surgical History:  ?Procedure Laterality Date  ? AMPUTATION Left 10/02/2013  ? Procedure: Repeat irrigation and debridement left foot, left 3rd toe amputation;  Surgeon: Mcarthur Rossetti, MD;  [facility];  Service: Orthopedics;  Laterality: Left;  ? AMPUTATION Left 11/06/2013  ? Procedure: LEFT FOOT TRANSMETATARSAL AMPUTATION ;  Surgeon: Mcarthur Rossetti, MD;  Location: [facility];  Service: Orthopedics;  Laterality: Left;  ? AMPUTATION Left 11/21/2013  ? Procedure: AMPUTATION BELOW KNEE;  Surgeon: Newt Minion, MD;  Location: [facility];  Service: Orthopedics;  Laterality: Left;  Left Below Knee Amputation  ? AMPUTATION Right 09/02/2017  ? Procedure: AMPUTATION RAY;  Surgeon: [name], MD;  [facility];  Service: Orthopedics;  Laterality: Right;  ? APPLICATION OF WOUND VAC Left 10/05/2013  ? Procedure: APPLICATION OF WOUND VAC;  Surgeon: Mcarthur Rossetti, MD;  [facility];  Service: Orthopedics;  Later
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
## 2022-01-12 NOTE — Telephone Encounter (Signed)
Lab called reporting critical lab value on pt.  Pt's K+ 6.6 today and Scr+ 23.36.  Notified Dr. Burr Medico. ?

## 2022-01-12 NOTE — Progress Notes (Signed)
Faxed Dr. Ernestina Penna office note and pt's lab results for today to Dr. Donato Heinz at the [facility] (T: [phone]  Fax: [phone]).  Fax confirmation received. ?

## 2022-01-13 ENCOUNTER — Ambulatory Visit: Payer: Medicare Other | Admitting: Hematology

## 2022-01-13 ENCOUNTER — Telehealth: Payer: Self-pay | Admitting: Hematology

## 2022-01-13 LAB — KAPPA/LAMBDA LIGHT CHAINS
Kappa free light chain: 385.7 mg/L — ABNORMAL HIGH (ref 3.3–19.4)
Kappa, lambda light chain ratio: 3 — ABNORMAL HIGH (ref 0.26–1.65)
Lambda free light chains: 128.4 mg/L — ABNORMAL HIGH (ref 5.7–26.3)

## 2022-01-13 NOTE — Telephone Encounter (Signed)
Sent calender with appts, scheduled per 5/16 los ?

## 2022-01-14 DIAGNOSIS — Z992 Dependence on renal dialysis: Secondary | ICD-10-CM | POA: Diagnosis not present

## 2022-01-14 DIAGNOSIS — D631 Anemia in chronic kidney disease: Secondary | ICD-10-CM | POA: Diagnosis not present

## 2022-01-14 DIAGNOSIS — D688 Other specified coagulation defects: Secondary | ICD-10-CM | POA: Diagnosis not present

## 2022-01-14 DIAGNOSIS — D472 Monoclonal gammopathy: Secondary | ICD-10-CM | POA: Diagnosis not present

## 2022-01-14 DIAGNOSIS — N186 End stage renal disease: Secondary | ICD-10-CM | POA: Diagnosis not present

## 2022-01-14 DIAGNOSIS — D509 Iron deficiency anemia, unspecified: Secondary | ICD-10-CM | POA: Diagnosis not present

## 2022-01-14 DIAGNOSIS — N2581 Secondary hyperparathyroidism of renal origin: Secondary | ICD-10-CM | POA: Diagnosis not present

## 2022-01-16 DIAGNOSIS — D631 Anemia in chronic kidney disease: Secondary | ICD-10-CM | POA: Diagnosis not present

## 2022-01-16 DIAGNOSIS — N2581 Secondary hyperparathyroidism of renal origin: Secondary | ICD-10-CM | POA: Diagnosis not present

## 2022-01-16 DIAGNOSIS — D688 Other specified coagulation defects: Secondary | ICD-10-CM | POA: Diagnosis not present

## 2022-01-16 DIAGNOSIS — D509 Iron deficiency anemia, unspecified: Secondary | ICD-10-CM | POA: Diagnosis not present

## 2022-01-16 DIAGNOSIS — N186 End stage renal disease: Secondary | ICD-10-CM | POA: Diagnosis not present

## 2022-01-16 DIAGNOSIS — D472 Monoclonal gammopathy: Secondary | ICD-10-CM | POA: Diagnosis not present

## 2022-01-16 DIAGNOSIS — Z992 Dependence on renal dialysis: Secondary | ICD-10-CM | POA: Diagnosis not present

## 2022-01-18 LAB — MULTIPLE MYELOMA PANEL, SERUM
Albumin SerPl Elph-Mcnc: 3.5 g/dL (ref 2.9–4.4)
Albumin/Glob SerPl: 1 (ref 0.7–1.7)
Alpha 1: 0.3 g/dL (ref 0.0–0.4)
Alpha2 Glob SerPl Elph-Mcnc: 0.8 g/dL (ref 0.4–1.0)
B-Globulin SerPl Elph-Mcnc: 0.9 g/dL (ref 0.7–1.3)
Gamma Glob SerPl Elph-Mcnc: 1.9 g/dL — ABNORMAL HIGH (ref 0.4–1.8)
Globulin, Total: 3.8 g/dL (ref 2.2–3.9)
IgA: 165 mg/dL (ref 61–437)
IgG (Immunoglobin G), Serum: 1756 mg/dL — ABNORMAL HIGH (ref 603–1613)
IgM (Immunoglobulin M), Srm: 364 mg/dL — ABNORMAL HIGH (ref 20–172)
M Protein SerPl Elph-Mcnc: 0.8 g/dL — ABNORMAL HIGH
Total Protein ELP: 7.3 g/dL (ref 6.0–8.5)

## 2022-01-19 ENCOUNTER — Ambulatory Visit: Payer: Self-pay | Admitting: *Deleted

## 2022-01-19 DIAGNOSIS — L97512 Non-pressure chronic ulcer of other part of right foot with fat layer exposed: Secondary | ICD-10-CM | POA: Diagnosis not present

## 2022-01-19 DIAGNOSIS — I739 Peripheral vascular disease, unspecified: Secondary | ICD-10-CM | POA: Diagnosis not present

## 2022-01-19 DIAGNOSIS — S92414K Nondisplaced fracture of proximal phalanx of right great toe, subsequent encounter for fracture with nonunion: Secondary | ICD-10-CM | POA: Diagnosis not present

## 2022-01-19 DIAGNOSIS — Z89432 Acquired absence of left foot: Secondary | ICD-10-CM | POA: Diagnosis not present

## 2022-01-19 DIAGNOSIS — L03115 Cellulitis of right lower limb: Secondary | ICD-10-CM | POA: Diagnosis not present

## 2022-01-19 NOTE — Telephone Encounter (Signed)
  Chief Complaint: Sore on foot Symptoms: Picked scab on foot, bleeding when touched only. States "Looks like a hole in it." Pt is diabetic Frequency: This AM Pertinent Negatives: Patient denies fever Disposition: '[]'$ ED /'[]'$ Urgent Care (no appt availability in office) / '[]'$ Appointment(In office/virtual)/ '[]'$  [facility]/ '[]'$ Home Care/ '[x]'$ Refused Recommended Disposition /'[]'$ [facility]/ '[]'$  Follow-up with PCP Additional Notes:  No availability at practice. States foot doctor told him not to pick, "I did." Questioned if he alerted his "Foot doctor" States is changing foot doctors, no appt until June. [place] "Want it seen today." [employer], declines. States he will go to UC near home. Pt hung up before care advise given. Reason for Disposition  Looks like a boil or deep ulcer  Answer Assessment - Initial Assessment Questions 1. APPEARANCE of SORES: "What do the sores look like?"     "Looks like a hole in there." 2. NUMBER: "How many sores are there?"     One 3. SIZE: "How big is the largest sore?"     Dime size 4. LOCATION: "Where are the sores located?"     Right foot 5. ONSET: "When did the sores begin?"     This AM, scab came off and bleeding when touch it 6. CAUSE: "What do you think is causing the sores?"     Unsure 7. OTHER SYMPTOMS: "Do you have any other symptoms?" (e.g., fever, new weakness no  Protocols used: Sores-A-AH

## 2022-01-19 NOTE — Telephone Encounter (Signed)
Called and spoke with patient. He states he was at [place] office at the time we were speaking.   Reminded of appt with Dr. Wynetta Emery. 02/12/2022 at 0910

## 2022-01-21 DIAGNOSIS — N186 End stage renal disease: Secondary | ICD-10-CM | POA: Diagnosis not present

## 2022-01-21 DIAGNOSIS — Z992 Dependence on renal dialysis: Secondary | ICD-10-CM | POA: Diagnosis not present

## 2022-01-21 DIAGNOSIS — D631 Anemia in chronic kidney disease: Secondary | ICD-10-CM | POA: Diagnosis not present

## 2022-01-21 DIAGNOSIS — D472 Monoclonal gammopathy: Secondary | ICD-10-CM | POA: Diagnosis not present

## 2022-01-21 DIAGNOSIS — D509 Iron deficiency anemia, unspecified: Secondary | ICD-10-CM | POA: Diagnosis not present

## 2022-01-21 DIAGNOSIS — D688 Other specified coagulation defects: Secondary | ICD-10-CM | POA: Diagnosis not present

## 2022-01-21 DIAGNOSIS — N2581 Secondary hyperparathyroidism of renal origin: Secondary | ICD-10-CM | POA: Diagnosis not present

## 2022-01-23 DIAGNOSIS — D688 Other specified coagulation defects: Secondary | ICD-10-CM | POA: Diagnosis not present

## 2022-01-23 DIAGNOSIS — N2581 Secondary hyperparathyroidism of renal origin: Secondary | ICD-10-CM | POA: Diagnosis not present

## 2022-01-23 DIAGNOSIS — D631 Anemia in chronic kidney disease: Secondary | ICD-10-CM | POA: Diagnosis not present

## 2022-01-23 DIAGNOSIS — D509 Iron deficiency anemia, unspecified: Secondary | ICD-10-CM | POA: Diagnosis not present

## 2022-01-23 DIAGNOSIS — D472 Monoclonal gammopathy: Secondary | ICD-10-CM | POA: Diagnosis not present

## 2022-01-23 DIAGNOSIS — Z992 Dependence on renal dialysis: Secondary | ICD-10-CM | POA: Diagnosis not present

## 2022-01-23 DIAGNOSIS — N186 End stage renal disease: Secondary | ICD-10-CM | POA: Diagnosis not present

## 2022-01-27 ENCOUNTER — Encounter: Payer: Self-pay | Admitting: Family

## 2022-01-27 ENCOUNTER — Ambulatory Visit (INDEPENDENT_AMBULATORY_CARE_PROVIDER_SITE_OTHER): Payer: Medicare Other | Admitting: Family

## 2022-01-27 ENCOUNTER — Other Ambulatory Visit (HOSPITAL_COMMUNITY): Payer: Self-pay

## 2022-01-27 DIAGNOSIS — L03115 Cellulitis of right lower limb: Secondary | ICD-10-CM | POA: Diagnosis not present

## 2022-01-27 DIAGNOSIS — Z992 Dependence on renal dialysis: Secondary | ICD-10-CM | POA: Diagnosis not present

## 2022-01-27 DIAGNOSIS — Z89512 Acquired absence of left leg below knee: Secondary | ICD-10-CM

## 2022-01-27 DIAGNOSIS — M1711 Unilateral primary osteoarthritis, right knee: Secondary | ICD-10-CM

## 2022-01-27 DIAGNOSIS — N186 End stage renal disease: Secondary | ICD-10-CM | POA: Diagnosis not present

## 2022-01-27 DIAGNOSIS — Z89432 Acquired absence of left foot: Secondary | ICD-10-CM | POA: Diagnosis not present

## 2022-01-27 DIAGNOSIS — E1129 Type 2 diabetes mellitus with other diabetic kidney complication: Secondary | ICD-10-CM | POA: Diagnosis not present

## 2022-01-27 DIAGNOSIS — L97512 Non-pressure chronic ulcer of other part of right foot with fat layer exposed: Secondary | ICD-10-CM | POA: Diagnosis not present

## 2022-01-27 DIAGNOSIS — M25461 Effusion, right knee: Secondary | ICD-10-CM

## 2022-01-27 DIAGNOSIS — S92414K Nondisplaced fracture of proximal phalanx of right great toe, subsequent encounter for fracture with nonunion: Secondary | ICD-10-CM | POA: Diagnosis not present

## 2022-01-29 DIAGNOSIS — M25461 Effusion, right knee: Secondary | ICD-10-CM | POA: Diagnosis not present

## 2022-01-29 MED ORDER — METHYLPREDNISOLONE ACETATE 40 MG/ML IJ SUSP
40.0000 mg | INTRAMUSCULAR | Status: AC | PRN
  Administered 2022-01-29: 40 mg via INTRA_ARTICULAR

## 2022-01-29 MED ORDER — LIDOCAINE HCL 1 % IJ SOLN
5.0000 mL | INTRAMUSCULAR | Status: AC | PRN
  Administered 2022-01-29: 5 mL
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
## 2022-02-01 ENCOUNTER — Ambulatory Visit: Payer: Self-pay

## 2022-02-01 ENCOUNTER — Telehealth: Payer: Self-pay | Admitting: Orthopedic Surgery

## 2022-02-01 ENCOUNTER — Telehealth: Payer: Self-pay | Admitting: Internal Medicine

## 2022-02-01 DIAGNOSIS — I5032 Chronic diastolic (congestive) heart failure: Secondary | ICD-10-CM

## 2022-02-01 NOTE — Addendum Note (Signed)
Addended by: Karle Plumber B on: 02/01/2022 06:21 PM   Modules accepted: Orders

## 2022-02-01 NOTE — Telephone Encounter (Signed)
Pt upset his carvedilol (COREG) 3.125 MG tablet is at the [facility]. Not sure he has picked this up at all. But he was advised to have this transferred, but pt states he is tired of calling for this Rx, he said it should not have been sent there to begin with. He would like someone to take care of this.  Pt also says he was told he did not have any furosemide (LASIX) 80 MG tablet at [facility] either.  When I advised pt there should be a refill there, but he just wanted to argue. Thanks.  [place], [address]     Pt. Reports out of Coreg and Lasix. Requests refills be sent to his [facility]. Please advise pt. Answer Assessment - Initial Assessment Questions 1. DRUG NAME: "What medicine do you need to have refilled?"     Coreg and Lasix 2. REFILLS REMAINING: "How many refills are remaining?" (Note: The label on the medicine or pill bottle will show how many refills are remaining. If there are no refills remaining, then a renewal may be needed.)     Pt. States he does not have refills 3. EXPIRATION DATE: "What is the expiration date?" (Note: The label states when the prescription will expire, and thus can no longer be refilled.)     Unsure 4. PRESCRIBING HCP: "Who prescribed it?" Reason: If prescribed by specialist, call should be referred to that group.     Will Dr. Wynetta Emery refill?  5. SYMPTOMS: "Do you have any symptoms?"     N/a 6. PREGNANCY: "Is there any chance that you are pregnant?" "When was your last menstrual period?"     N/a  Protocols used: Medication Refill and Renewal Call-A-AH

## 2022-02-01 NOTE — Telephone Encounter (Signed)
error 

## 2022-02-01 NOTE — Telephone Encounter (Signed)
Phone call placed to patient this afternoon to verify the dose of carvedilol that he is taking.  Wanted to verify whether he is taking the 3.125 mg or the 25 mg before I sent prescription to his [facility].  I called and left a message on his voicemail.  I will have the [place] call him in the morning to verify.  We will also have her verify whether he is taking furosemide 80 mg once a day or twice a day.  Addendum 02/02/2022: Phone call placed to patient today.  Patient confirmed that he is taking carvedilol 25 mg twice a day and furosemide 80 mg once a day.  I told him that I will forward these prescriptions to his pharmacy at [facility] on [address].

## 2022-02-01 NOTE — Telephone Encounter (Signed)
SW pt, he did not get Rx at his last OV. I let him know that we will fax an order to [facility] clinic for him.

## 2022-02-01 NOTE — Telephone Encounter (Signed)
Pt called requesting a call back. Pt states he needs parts for his prostatic left leg. Pt phone number is [phone].

## 2022-02-01 NOTE — Telephone Encounter (Signed)
Does patient need to be taking Coreg?

## 2022-02-02 DIAGNOSIS — Z992 Dependence on renal dialysis: Secondary | ICD-10-CM | POA: Diagnosis not present

## 2022-02-02 DIAGNOSIS — D631 Anemia in chronic kidney disease: Secondary | ICD-10-CM | POA: Diagnosis not present

## 2022-02-02 DIAGNOSIS — D688 Other specified coagulation defects: Secondary | ICD-10-CM | POA: Diagnosis not present

## 2022-02-02 DIAGNOSIS — N2581 Secondary hyperparathyroidism of renal origin: Secondary | ICD-10-CM | POA: Diagnosis not present

## 2022-02-02 DIAGNOSIS — D472 Monoclonal gammopathy: Secondary | ICD-10-CM | POA: Diagnosis not present

## 2022-02-02 DIAGNOSIS — N186 End stage renal disease: Secondary | ICD-10-CM | POA: Diagnosis not present

## 2022-02-02 MED ORDER — CARVEDILOL 25 MG PO TABS: 25.0000 mg | ORAL_TABLET | Freq: Two times a day (BID) | ORAL | 3 refills | Status: AC

## 2022-02-02 MED ORDER — FUROSEMIDE 80 MG PO TABS: 80.0000 mg | ORAL_TABLET | Freq: Every day | ORAL | 3 refills | Status: AC

## 2022-02-02 NOTE — Addendum Note (Signed)
Addended by: Karle Plumber B on: 02/02/2022 01:11 PM   Modules accepted: Orders

## 2022-02-04 DIAGNOSIS — L97522 Non-pressure chronic ulcer of other part of left foot with fat layer exposed: Secondary | ICD-10-CM | POA: Diagnosis not present

## 2022-02-04 DIAGNOSIS — Z89432 Acquired absence of left foot: Secondary | ICD-10-CM | POA: Diagnosis not present

## 2022-02-04 DIAGNOSIS — L03115 Cellulitis of right lower limb: Secondary | ICD-10-CM | POA: Diagnosis not present

## 2022-02-04 DIAGNOSIS — S92414K Nondisplaced fracture of proximal phalanx of right great toe, subsequent encounter for fracture with nonunion: Secondary | ICD-10-CM | POA: Diagnosis not present

## 2022-02-04 DIAGNOSIS — I739 Peripheral vascular disease, unspecified: Secondary | ICD-10-CM | POA: Diagnosis not present

## 2022-02-05 DIAGNOSIS — L97519 Non-pressure chronic ulcer of other part of right foot with unspecified severity: Secondary | ICD-10-CM | POA: Diagnosis not present

## 2022-02-05 DIAGNOSIS — I70235 Atherosclerosis of native arteries of right leg with ulceration of other part of foot: Secondary | ICD-10-CM | POA: Diagnosis not present

## 2022-02-08 ENCOUNTER — Ambulatory Visit: Payer: Self-pay

## 2022-02-08 NOTE — Telephone Encounter (Signed)
     Chief Complaint: Weakness left leg. "I'm having a hard time lifting my leg up since I had testing last week at [facility]. They pushed on my groin area."  Symptoms: Above Frequency: Last week Pertinent Negatives: Patient denies SOB OR CHEST pain. Disposition: '[]'$ ED /'[]'$ Urgent Care (no appt availability in office) / '[x]'$ Appointment(In office/virtual)/ '[]'$  [facility]/ '[]'$ Home Care/ '[]'$ Refused Recommended Disposition /'[]'$ [facility]/ '[]'$  Follow-up with PCP Additional Notes: Instructed to go to ED for worsening of symptoms.  Reason for Disposition  [1] Weakness of arm / hand, or leg / foot AND [2] is a chronic symptom (recurrent or ongoing AND present > 4 weeks)  Answer Assessment - Initial Assessment Questions 1. SYMPTOM: "What is the main symptom you are concerned about?" (e.g., weakness, numbness)     Weakness left 2. ONSET: "When did this start?" (minutes, hours, days; while sleeping)     Last week 3. LAST NORMAL: "When was the last time you (the patient) were normal (no symptoms)?"     Last week 4. PATTERN "Does this come and go, or has it been constant since it started?"  "Is it present now?"     Constant 5. CARDIAC SYMPTOMS: "Have you had any of the following symptoms: chest pain, difficulty breathing, palpitations?"     No 6. NEUROLOGIC SYMPTOMS: "Have you had any of the following symptoms: headache, dizziness, vision loss, double vision, changes in speech, unsteady on your feet?"     Having difficulty moving leg 7. OTHER SYMPTOMS: "Do you have any other symptoms?"     Swelling 8. PREGNANCY: "Is there any chance you are pregnant?" "When was your last menstrual period?"     N/a  Protocols used: Neurologic Deficit-A-AH

## 2022-02-09 ENCOUNTER — Emergency Department (HOSPITAL_COMMUNITY): Payer: Medicare Other

## 2022-02-09 ENCOUNTER — Inpatient Hospital Stay (HOSPITAL_COMMUNITY)
Admission: EM | Admit: 2022-02-09 | Discharge: 2022-02-13 | DRG: 640 | Disposition: A | Discharge status: Home Health | Payer: Medicare Other | Attending: Internal Medicine | Admitting: Internal Medicine

## 2022-02-09 ENCOUNTER — Other Ambulatory Visit: Payer: Self-pay

## 2022-02-09 ENCOUNTER — Encounter (HOSPITAL_COMMUNITY): Payer: Self-pay

## 2022-02-09 DIAGNOSIS — E113299 Type 2 diabetes mellitus with mild nonproliferative diabetic retinopathy without macular edema, unspecified eye: Secondary | ICD-10-CM | POA: Diagnosis present

## 2022-02-09 DIAGNOSIS — R5381 Other malaise: Secondary | ICD-10-CM | POA: Diagnosis present

## 2022-02-09 DIAGNOSIS — E1169 Type 2 diabetes mellitus with other specified complication: Secondary | ICD-10-CM | POA: Diagnosis present

## 2022-02-09 DIAGNOSIS — D631 Anemia in chronic kidney disease: Secondary | ICD-10-CM | POA: Diagnosis not present

## 2022-02-09 DIAGNOSIS — J9601 Acute respiratory failure with hypoxia: Secondary | ICD-10-CM | POA: Diagnosis not present

## 2022-02-09 DIAGNOSIS — L97519 Non-pressure chronic ulcer of other part of right foot with unspecified severity: Secondary | ICD-10-CM | POA: Diagnosis present

## 2022-02-09 DIAGNOSIS — I5033 Acute on chronic diastolic (congestive) heart failure: Secondary | ICD-10-CM | POA: Diagnosis present

## 2022-02-09 DIAGNOSIS — I132 Hypertensive heart and chronic kidney disease with heart failure and with stage 5 chronic kidney disease, or end stage renal disease: Secondary | ICD-10-CM | POA: Diagnosis not present

## 2022-02-09 DIAGNOSIS — R0600 Dyspnea, unspecified: Principal | ICD-10-CM

## 2022-02-09 DIAGNOSIS — Z89512 Acquired absence of left leg below knee: Secondary | ICD-10-CM | POA: Diagnosis not present

## 2022-02-09 DIAGNOSIS — K219 Gastro-esophageal reflux disease without esophagitis: Secondary | ICD-10-CM | POA: Diagnosis not present

## 2022-02-09 DIAGNOSIS — D472 Monoclonal gammopathy: Secondary | ICD-10-CM | POA: Diagnosis present

## 2022-02-09 DIAGNOSIS — M79605 Pain in left leg: Secondary | ICD-10-CM | POA: Diagnosis not present

## 2022-02-09 DIAGNOSIS — M1990 Unspecified osteoarthritis, unspecified site: Secondary | ICD-10-CM | POA: Diagnosis not present

## 2022-02-09 DIAGNOSIS — S81801A Unspecified open wound, right lower leg, initial encounter: Secondary | ICD-10-CM | POA: Diagnosis present

## 2022-02-09 DIAGNOSIS — R609 Edema, unspecified: Secondary | ICD-10-CM | POA: Diagnosis not present

## 2022-02-09 DIAGNOSIS — M1710 Unilateral primary osteoarthritis, unspecified knee: Secondary | ICD-10-CM | POA: Diagnosis present

## 2022-02-09 DIAGNOSIS — Z794 Long term (current) use of insulin: Secondary | ICD-10-CM

## 2022-02-09 DIAGNOSIS — M898X9 Other specified disorders of bone, unspecified site: Secondary | ICD-10-CM | POA: Diagnosis not present

## 2022-02-09 DIAGNOSIS — Z9081 Acquired absence of spleen: Secondary | ICD-10-CM

## 2022-02-09 DIAGNOSIS — C9000 Multiple myeloma not having achieved remission: Secondary | ICD-10-CM | POA: Diagnosis present

## 2022-02-09 DIAGNOSIS — I10 Essential (primary) hypertension: Secondary | ICD-10-CM | POA: Diagnosis not present

## 2022-02-09 DIAGNOSIS — Z91158 Patient's noncompliance with renal dialysis for other reason: Secondary | ICD-10-CM

## 2022-02-09 DIAGNOSIS — E1151 Type 2 diabetes mellitus with diabetic peripheral angiopathy without gangrene: Secondary | ICD-10-CM | POA: Diagnosis not present

## 2022-02-09 DIAGNOSIS — I5032 Chronic diastolic (congestive) heart failure: Secondary | ICD-10-CM | POA: Diagnosis present

## 2022-02-09 DIAGNOSIS — Z8616 Personal history of COVID-19: Secondary | ICD-10-CM | POA: Diagnosis not present

## 2022-02-09 DIAGNOSIS — E8770 Fluid overload, unspecified: Principal | ICD-10-CM | POA: Diagnosis present

## 2022-02-09 DIAGNOSIS — G546 Phantom limb syndrome with pain: Secondary | ICD-10-CM | POA: Diagnosis present

## 2022-02-09 DIAGNOSIS — J811 Chronic pulmonary edema: Secondary | ICD-10-CM | POA: Diagnosis not present

## 2022-02-09 DIAGNOSIS — N2581 Secondary hyperparathyroidism of renal origin: Secondary | ICD-10-CM | POA: Diagnosis not present

## 2022-02-09 DIAGNOSIS — Z6830 Body mass index (BMI) 30.0-30.9, adult: Secondary | ICD-10-CM

## 2022-02-09 DIAGNOSIS — Z833 Family history of diabetes mellitus: Secondary | ICD-10-CM

## 2022-02-09 DIAGNOSIS — Z992 Dependence on renal dialysis: Secondary | ICD-10-CM | POA: Diagnosis not present

## 2022-02-09 DIAGNOSIS — S81802A Unspecified open wound, left lower leg, initial encounter: Secondary | ICD-10-CM | POA: Diagnosis not present

## 2022-02-09 DIAGNOSIS — G40909 Epilepsy, unspecified, not intractable, without status epilepticus: Secondary | ICD-10-CM | POA: Diagnosis present

## 2022-02-09 DIAGNOSIS — R6889 Other general symptoms and signs: Secondary | ICD-10-CM | POA: Diagnosis not present

## 2022-02-09 DIAGNOSIS — R0602 Shortness of breath: Secondary | ICD-10-CM | POA: Diagnosis not present

## 2022-02-09 DIAGNOSIS — Z743 Need for continuous supervision: Secondary | ICD-10-CM | POA: Diagnosis not present

## 2022-02-09 DIAGNOSIS — E875 Hyperkalemia: Secondary | ICD-10-CM | POA: Diagnosis present

## 2022-02-09 DIAGNOSIS — Z79899 Other long term (current) drug therapy: Secondary | ICD-10-CM

## 2022-02-09 DIAGNOSIS — E1122 Type 2 diabetes mellitus with diabetic chronic kidney disease: Secondary | ICD-10-CM | POA: Diagnosis present

## 2022-02-09 DIAGNOSIS — I739 Peripheral vascular disease, unspecified: Secondary | ICD-10-CM | POA: Diagnosis present

## 2022-02-09 DIAGNOSIS — E669 Obesity, unspecified: Secondary | ICD-10-CM | POA: Diagnosis present

## 2022-02-09 DIAGNOSIS — N186 End stage renal disease: Secondary | ICD-10-CM | POA: Diagnosis not present

## 2022-02-09 DIAGNOSIS — M7989 Other specified soft tissue disorders: Secondary | ICD-10-CM | POA: Diagnosis not present

## 2022-02-09 DIAGNOSIS — R059 Cough, unspecified: Secondary | ICD-10-CM | POA: Diagnosis not present

## 2022-02-09 DIAGNOSIS — E11621 Type 2 diabetes mellitus with foot ulcer: Secondary | ICD-10-CM | POA: Diagnosis present

## 2022-02-09 DIAGNOSIS — Z87891 Personal history of nicotine dependence: Secondary | ICD-10-CM

## 2022-02-09 DIAGNOSIS — Z8249 Family history of ischemic heart disease and other diseases of the circulatory system: Secondary | ICD-10-CM

## 2022-02-09 LAB — COMPREHENSIVE METABOLIC PANEL
ALT: 8 U/L (ref 0–44)
AST: 6 U/L — ABNORMAL LOW (ref 15–41)
Albumin: 3.1 g/dL — ABNORMAL LOW (ref 3.5–5.0)
Alkaline Phosphatase: 55 U/L (ref 38–126)
Anion gap: 17 — ABNORMAL HIGH (ref 5–15)
BUN: 109 mg/dL — ABNORMAL HIGH (ref 8–23)
CO2: 17 mmol/L — ABNORMAL LOW (ref 22–32)
Calcium: 9.4 mg/dL (ref 8.9–10.3)
Chloride: 109 mmol/L (ref 98–111)
Creatinine, Ser: 19.63 mg/dL — ABNORMAL HIGH (ref 0.61–1.24)
GFR, Estimated: 2 mL/min — ABNORMAL LOW (ref >60)
Glucose, Bld: 127 mg/dL — ABNORMAL HIGH (ref 70–99)
Potassium: 5.2 mmol/L — ABNORMAL HIGH (ref 3.5–5.1)
Sodium: 143 mmol/L (ref 135–145)
Total Bilirubin: 0.7 mg/dL (ref 0.3–1.2)
Total Protein: 7 g/dL (ref 6.5–8.1)

## 2022-02-09 LAB — CBC WITH DIFFERENTIAL/PLATELET
Abs Immature Granulocytes: 0.02 10*3/uL (ref 0.00–0.07)
Basophils Absolute: 0.1 10*3/uL (ref 0.0–0.1)
Basophils Relative: 1 %
Eosinophils Absolute: 0.3 10*3/uL (ref 0.0–0.5)
Eosinophils Relative: 5 %
HCT: 24.2 % — ABNORMAL LOW (ref 39.0–52.0)
Hemoglobin: 7.6 g/dL — ABNORMAL LOW (ref 13.0–17.0)
Immature Granulocytes: 0 %
Lymphocytes Relative: 30 %
Lymphs Abs: 2.2 10*3/uL (ref 0.7–4.0)
MCH: 30.6 pg (ref 26.0–34.0)
MCHC: 31.4 g/dL (ref 30.0–36.0)
MCV: 97.6 fL (ref 80.0–100.0)
Monocytes Absolute: 0.7 10*3/uL (ref 0.1–1.0)
Monocytes Relative: 9 %
Neutro Abs: 4 10*3/uL (ref 1.7–7.7)
Neutrophils Relative %: 55 %
Platelets: 420 10*3/uL — ABNORMAL HIGH (ref 150–400)
RBC: 2.48 MIL/uL — ABNORMAL LOW (ref 4.22–5.81)
RDW: 16.9 % — ABNORMAL HIGH (ref 11.5–15.5)
WBC: 7.2 10*3/uL (ref 4.0–10.5)
nRBC: 1.7 % — ABNORMAL HIGH (ref 0.0–0.2)

## 2022-02-09 LAB — BRAIN NATRIURETIC PEPTIDE: B Natriuretic Peptide: 1682.2 pg/mL — ABNORMAL HIGH (ref 0.0–100.0)

## 2022-02-09 LAB — GLUCOSE, CAPILLARY
Glucose-Capillary: 88 mg/dL (ref 70–99)
Glucose-Capillary: 108 mg/dL — ABNORMAL HIGH (ref 70–99)

## 2022-02-09 LAB — HEPATITIS B SURFACE ANTIGEN: Hepatitis B Surface Ag: NONREACTIVE

## 2022-02-09 LAB — HIV ANTIBODY (ROUTINE TESTING W REFLEX): HIV Screen 4th Generation wRfx: NONREACTIVE

## 2022-02-09 LAB — PHOSPHORUS: Phosphorus: 4.1 mg/dL (ref 2.5–4.6)

## 2022-02-09 LAB — TROPONIN I (HIGH SENSITIVITY)
Troponin I (High Sensitivity): 62 ng/L — ABNORMAL HIGH (ref <18)
Troponin I (High Sensitivity): 56 ng/L — ABNORMAL HIGH (ref <18)

## 2022-02-09 LAB — HEPATITIS C ANTIBODY: HCV Ab: NONREACTIVE

## 2022-02-09 LAB — HEPATITIS B SURFACE ANTIBODY,QUALITATIVE: Hep B S Ab: NONREACTIVE

## 2022-02-09 LAB — HEPATITIS B CORE ANTIBODY, TOTAL: Hep B Core Total Ab: NONREACTIVE

## 2022-02-09 MED ORDER — DARBEPOETIN ALFA 60 MCG/0.3ML IJ SOSY: 60.0000 ug | PREFILLED_SYRINGE | INTRAMUSCULAR | Status: DC

## 2022-02-09 MED ORDER — ONDANSETRON HCL 4 MG PO TABS: 4.0000 mg | ORAL_TABLET | Freq: Four times a day (QID) | ORAL | Status: DC | PRN

## 2022-02-09 MED ORDER — ATORVASTATIN CALCIUM 10 MG PO TABS
20.0000 mg | ORAL_TABLET | Freq: Every day | ORAL | Status: DC
  Administered 2022-02-09 – 2022-02-13 (×5): 20 mg via ORAL
  Filled 2022-02-09 (×5): qty 2

## 2022-02-09 MED ORDER — ACETAMINOPHEN 325 MG PO TABS
650.0000 mg | ORAL_TABLET | Freq: Four times a day (QID) | ORAL | Status: DC | PRN
  Administered 2022-02-13: 650 mg via ORAL
  Filled 2022-02-09: qty 2

## 2022-02-09 MED ORDER — INSULIN ASPART 100 UNIT/ML IJ SOLN
0.0000 [IU] | Freq: Three times a day (TID) | INTRAMUSCULAR | Status: DC
  Administered 2022-02-11: 1 [IU] via SUBCUTANEOUS

## 2022-02-09 MED ORDER — HEPARIN SODIUM (PORCINE) 5000 UNIT/ML IJ SOLN
5000.0000 [IU] | Freq: Three times a day (TID) | INTRAMUSCULAR | Status: DC
Start: 2022-02-09 — End: 2022-02-13
  Administered 2022-02-09 – 2022-02-13 (×11): 5000 [IU] via SUBCUTANEOUS
  Filled 2022-02-09 (×12): qty 1

## 2022-02-09 MED ORDER — GABAPENTIN 100 MG PO CAPS
100.0000 mg | ORAL_CAPSULE | Freq: Two times a day (BID) | ORAL | Status: DC
  Administered 2022-02-09: 100 mg via ORAL
  Filled 2022-02-09: qty 1

## 2022-02-09 MED ORDER — MIDODRINE HCL 5 MG PO TABS
5.0000 mg | ORAL_TABLET | Freq: Two times a day (BID) | ORAL | Status: DC
Start: 2022-02-09 — End: 2022-02-09

## 2022-02-09 MED ORDER — CINACALCET HCL 30 MG PO TABS
120.0000 mg | ORAL_TABLET | ORAL | Status: DC
  Administered 2022-02-11 – 2022-02-13 (×2): 120 mg via ORAL
  Filled 2022-02-09: qty 4

## 2022-02-09 MED ORDER — ONDANSETRON HCL 4 MG/2ML IJ SOLN: 4.0000 mg | Freq: Four times a day (QID) | INTRAMUSCULAR | Status: DC | PRN

## 2022-02-09 MED ORDER — INSULIN GLARGINE-YFGN 100 UNIT/ML ~~LOC~~ SOLN
15.0000 [IU] | Freq: Every day | SUBCUTANEOUS | Status: DC
  Administered 2022-02-09 – 2022-02-12 (×4): 15 [IU] via SUBCUTANEOUS
  Filled 2022-02-09 (×5): qty 0.15

## 2022-02-09 MED ORDER — ACETAMINOPHEN 650 MG RE SUPP: 650.0000 mg | Freq: Four times a day (QID) | RECTAL | Status: DC | PRN

## 2022-02-09 MED ORDER — SUCROFERRIC OXYHYDROXIDE 500 MG PO CHEW
1000.0000 mg | CHEWABLE_TABLET | Freq: Three times a day (TID) | ORAL | Status: DC
  Administered 2022-02-10 – 2022-02-13 (×8): 1000 mg via ORAL
  Filled 2022-02-09 (×15): qty 2

## 2022-02-09 MED ORDER — ADULT MULTIVITAMIN W/MINERALS CH
1.0000 | ORAL_TABLET | Freq: Every day | ORAL | Status: DC
  Administered 2022-02-10 – 2022-02-13 (×4): 1 via ORAL
  Filled 2022-02-09 (×4): qty 1

## 2022-02-09 MED ORDER — HEPARIN SODIUM (PORCINE) 1000 UNIT/ML IJ SOLN
INTRAMUSCULAR | Status: AC
  Administered 2022-02-09: 2800 [IU] via INTRAVENOUS_CENTRAL
  Filled 2022-02-09: qty 3

## 2022-02-09 MED ORDER — CARVEDILOL 25 MG PO TABS
25.0000 mg | ORAL_TABLET | Freq: Two times a day (BID) | ORAL | Status: DC
  Administered 2022-02-09 – 2022-02-12 (×7): 25 mg via ORAL
  Filled 2022-02-09 (×8): qty 1

## 2022-02-09 MED ORDER — FUROSEMIDE 40 MG PO TABS
80.0000 mg | ORAL_TABLET | Freq: Every day | ORAL | Status: DC
  Administered 2022-02-10 – 2022-02-13 (×4): 80 mg via ORAL
  Filled 2022-02-09 (×4): qty 2

## 2022-02-09 MED ORDER — CHLORHEXIDINE GLUCONATE CLOTH 2 % EX PADS: 6.0000 | MEDICATED_PAD | Freq: Every day | CUTANEOUS | Status: DC

## 2022-02-09 MED ORDER — CALCITRIOL 0.5 MCG PO CAPS
1.2500 ug | ORAL_CAPSULE | ORAL | Status: DC
  Administered 2022-02-11 – 2022-02-13 (×2): 1.25 ug via ORAL
  Filled 2022-02-09 (×2): qty 1

## 2022-02-09 MED ORDER — DARBEPOETIN ALFA 60 MCG/0.3ML IJ SOSY
60.0000 ug | PREFILLED_SYRINGE | INTRAMUSCULAR | Status: DC
  Administered 2022-02-09: 60 ug via INTRAVENOUS
  Filled 2022-02-09 (×2): qty 0.3

## 2022-02-09 MED ORDER — HEPARIN SODIUM (PORCINE) 1000 UNIT/ML DIALYSIS
2800.0000 [IU] | INTRAMUSCULAR | Status: AC | PRN
  Administered 2022-02-09: 2800 [IU] via INTRAVENOUS_CENTRAL
  Filled 2022-02-09: qty 3

## 2022-02-09 MED ORDER — HYDROCODONE-ACETAMINOPHEN 5-325 MG PO TABS
1.0000 | ORAL_TABLET | Freq: Once | ORAL | Status: AC
  Administered 2022-02-09: 1 via ORAL
  Filled 2022-02-09: qty 1

## 2022-02-09 MED ORDER — OXYCODONE HCL 5 MG PO TABS
5.0000 mg | ORAL_TABLET | ORAL | Status: DC | PRN
  Administered 2022-02-10 – 2022-02-11 (×4): 5 mg via ORAL
  Filled 2022-02-09 (×4): qty 1

## 2022-02-09 MED ORDER — ALBUTEROL SULFATE (2.5 MG/3ML) 0.083% IN NEBU: 2.5000 mg | INHALATION_SOLUTION | RESPIRATORY_TRACT | Status: DC | PRN

## 2022-02-09 MED ORDER — POLYETHYLENE GLYCOL 3350 17 G PO PACK: 17.0000 g | PACK | Freq: Every day | ORAL | Status: DC | PRN
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
## 2022-02-09 NOTE — Procedures (Signed)
   I was present at this dialysis session, have reviewed the session itself and made  appropriate changes Kelly Splinter MD [place] pager [phone]   02/09/2022, 3:11 PM

## 2022-02-09 NOTE — Consult Note (Signed)
[facility] Nurse Consult Note: Reason for Consult: foot wound Patient history of LBKA per Dr. Sharol Given Now with one full thickness and one partial thickness wound on the RLE, increasing swelling  Wound type:  Dorsal foot: full thickness; 100% pink, dry Right medial malleolus; partial thickness; 100% pink, dry Pressure Injury POA:NA Measurement: see nursing flow sheets Wound bed: see above  Drainage (amount, consistency, odor) none  Periwound: edema  Dressing procedure/placement/frequency: Clean right foot and ankle wounds with saline, pat dry  2. Apply xeroform to the right ankle wound, top with foam dressing  3. Apply hydrogel Kellie Simmering # [id number]) to the dorsal foot wound, top with dry dressing  4. Secure all with kerlix and ACE wrap with NO tension, just to hold in place.  Follow up with Dr Sharol Given as an outpatient  Discussed POC with patient and bedside nurse.  Re consult if needed, will not follow at this time. Thanks  Mearl Harewood [employer], RN,CWOCN, CNS, [place] [phone])

## 2022-02-09 NOTE — ED Notes (Signed)
Got patient into a gown on the monitor did ekg shown to er provider patient is resting with call bell in reach  
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
## 2022-02-09 NOTE — ED Notes (Signed)
Pt left BKA and has pain above prosthetic

## 2022-02-09 NOTE — Progress Notes (Signed)
New Admission Note:   Arrival Method: Stretcher  Mental Orientation: alert and oriented  Telemetry: Box 9  Assessment: Completed Skin: abrasion on right foot lateral, medial and chin  IV: Pain: Tubes: Safety Measures: Safety Fall Prevention Plan has been given, discussed and signed Admission: Completed [facility] [place] Orientation: Patient has been orientated to the room, unit and staff.  Family:  Orders have been reviewed and implemented. Will continue to monitor the patient. Call light has been placed within reach and bed alarm has been activated.   Durwin Davisson RN [facility] Phone: [phone]

## 2022-02-09 NOTE — ED Triage Notes (Signed)
Pt bib [facility] from home with complaints of shob and cough x 2 days. Pt also complains of left leg swelling x 3 days. Pt is a dialysis pt T-TH-S and has not been to a treatment since last Tuesday.  EMS vitals: 168/80, 70, 92-93% RA
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
## 2022-02-10 ENCOUNTER — Observation Stay (HOSPITAL_COMMUNITY): Payer: Medicare Other

## 2022-02-10 ENCOUNTER — Ambulatory Visit: Payer: Medicare Other | Admitting: Nurse Practitioner

## 2022-02-10 DIAGNOSIS — J811 Chronic pulmonary edema: Secondary | ICD-10-CM | POA: Diagnosis not present

## 2022-02-10 DIAGNOSIS — D472 Monoclonal gammopathy: Secondary | ICD-10-CM

## 2022-02-10 DIAGNOSIS — S81802A Unspecified open wound, left lower leg, initial encounter: Secondary | ICD-10-CM | POA: Diagnosis not present

## 2022-02-10 DIAGNOSIS — G40909 Epilepsy, unspecified, not intractable, without status epilepticus: Secondary | ICD-10-CM | POA: Diagnosis present

## 2022-02-10 DIAGNOSIS — I132 Hypertensive heart and chronic kidney disease with heart failure and with stage 5 chronic kidney disease, or end stage renal disease: Secondary | ICD-10-CM | POA: Diagnosis not present

## 2022-02-10 DIAGNOSIS — I5033 Acute on chronic diastolic (congestive) heart failure: Secondary | ICD-10-CM | POA: Diagnosis present

## 2022-02-10 DIAGNOSIS — G546 Phantom limb syndrome with pain: Secondary | ICD-10-CM | POA: Diagnosis present

## 2022-02-10 DIAGNOSIS — N2581 Secondary hyperparathyroidism of renal origin: Secondary | ICD-10-CM | POA: Diagnosis present

## 2022-02-10 DIAGNOSIS — Z8616 Personal history of COVID-19: Secondary | ICD-10-CM | POA: Diagnosis not present

## 2022-02-10 DIAGNOSIS — E8770 Fluid overload, unspecified: Secondary | ICD-10-CM | POA: Diagnosis not present

## 2022-02-10 DIAGNOSIS — Z7401 Bed confinement status: Secondary | ICD-10-CM | POA: Diagnosis not present

## 2022-02-10 DIAGNOSIS — J9601 Acute respiratory failure with hypoxia: Secondary | ICD-10-CM | POA: Diagnosis not present

## 2022-02-10 DIAGNOSIS — E1169 Type 2 diabetes mellitus with other specified complication: Secondary | ICD-10-CM | POA: Diagnosis present

## 2022-02-10 DIAGNOSIS — K219 Gastro-esophageal reflux disease without esophagitis: Secondary | ICD-10-CM | POA: Diagnosis present

## 2022-02-10 DIAGNOSIS — I878 Other specified disorders of veins: Secondary | ICD-10-CM | POA: Diagnosis not present

## 2022-02-10 DIAGNOSIS — I10 Essential (primary) hypertension: Secondary | ICD-10-CM | POA: Diagnosis not present

## 2022-02-10 DIAGNOSIS — S81801A Unspecified open wound, right lower leg, initial encounter: Secondary | ICD-10-CM | POA: Diagnosis not present

## 2022-02-10 DIAGNOSIS — Z992 Dependence on renal dialysis: Secondary | ICD-10-CM | POA: Diagnosis not present

## 2022-02-10 DIAGNOSIS — Z89512 Acquired absence of left leg below knee: Secondary | ICD-10-CM | POA: Diagnosis not present

## 2022-02-10 DIAGNOSIS — M25562 Pain in left knee: Secondary | ICD-10-CM | POA: Diagnosis not present

## 2022-02-10 DIAGNOSIS — J810 Acute pulmonary edema: Secondary | ICD-10-CM | POA: Diagnosis not present

## 2022-02-10 DIAGNOSIS — E8779 Other fluid overload: Secondary | ICD-10-CM | POA: Diagnosis not present

## 2022-02-10 DIAGNOSIS — L97519 Non-pressure chronic ulcer of other part of right foot with unspecified severity: Secondary | ICD-10-CM | POA: Diagnosis present

## 2022-02-10 DIAGNOSIS — R5383 Other fatigue: Secondary | ICD-10-CM | POA: Diagnosis not present

## 2022-02-10 DIAGNOSIS — M25552 Pain in left hip: Secondary | ICD-10-CM | POA: Diagnosis not present

## 2022-02-10 DIAGNOSIS — R609 Edema, unspecified: Secondary | ICD-10-CM | POA: Diagnosis not present

## 2022-02-10 DIAGNOSIS — Z743 Need for continuous supervision: Secondary | ICD-10-CM | POA: Diagnosis not present

## 2022-02-10 DIAGNOSIS — E1151 Type 2 diabetes mellitus with diabetic peripheral angiopathy without gangrene: Secondary | ICD-10-CM | POA: Diagnosis present

## 2022-02-10 DIAGNOSIS — M898X9 Other specified disorders of bone, unspecified site: Secondary | ICD-10-CM | POA: Diagnosis present

## 2022-02-10 DIAGNOSIS — D631 Anemia in chronic kidney disease: Secondary | ICD-10-CM | POA: Diagnosis not present

## 2022-02-10 DIAGNOSIS — R0600 Dyspnea, unspecified: Secondary | ICD-10-CM | POA: Diagnosis not present

## 2022-02-10 DIAGNOSIS — N186 End stage renal disease: Secondary | ICD-10-CM | POA: Diagnosis not present

## 2022-02-10 DIAGNOSIS — N250 Renal osteodystrophy: Secondary | ICD-10-CM | POA: Diagnosis not present

## 2022-02-10 DIAGNOSIS — E875 Hyperkalemia: Secondary | ICD-10-CM | POA: Diagnosis present

## 2022-02-10 DIAGNOSIS — E669 Obesity, unspecified: Secondary | ICD-10-CM | POA: Diagnosis present

## 2022-02-10 DIAGNOSIS — I5032 Chronic diastolic (congestive) heart failure: Secondary | ICD-10-CM | POA: Diagnosis not present

## 2022-02-10 DIAGNOSIS — M533 Sacrococcygeal disorders, not elsewhere classified: Secondary | ICD-10-CM | POA: Diagnosis not present

## 2022-02-10 DIAGNOSIS — M79605 Pain in left leg: Secondary | ICD-10-CM | POA: Diagnosis not present

## 2022-02-10 DIAGNOSIS — M1990 Unspecified osteoarthritis, unspecified site: Secondary | ICD-10-CM | POA: Diagnosis present

## 2022-02-10 DIAGNOSIS — C9000 Multiple myeloma not having achieved remission: Secondary | ICD-10-CM | POA: Diagnosis present

## 2022-02-10 DIAGNOSIS — E113299 Type 2 diabetes mellitus with mild nonproliferative diabetic retinopathy without macular edema, unspecified eye: Secondary | ICD-10-CM | POA: Diagnosis present

## 2022-02-10 DIAGNOSIS — I509 Heart failure, unspecified: Secondary | ICD-10-CM | POA: Diagnosis not present

## 2022-02-10 DIAGNOSIS — R5381 Other malaise: Secondary | ICD-10-CM | POA: Diagnosis present

## 2022-02-10 DIAGNOSIS — M1710 Unilateral primary osteoarthritis, unspecified knee: Secondary | ICD-10-CM | POA: Diagnosis present

## 2022-02-10 DIAGNOSIS — R0602 Shortness of breath: Secondary | ICD-10-CM | POA: Diagnosis not present

## 2022-02-10 LAB — CBC
HCT: 24.1 % — ABNORMAL LOW (ref 39.0–52.0)
Hemoglobin: 7.7 g/dL — ABNORMAL LOW (ref 13.0–17.0)
MCH: 30.2 pg (ref 26.0–34.0)
MCHC: 32 g/dL (ref 30.0–36.0)
MCV: 94.5 fL (ref 80.0–100.0)
Platelets: 442 10*3/uL — ABNORMAL HIGH (ref 150–400)
RBC: 2.55 MIL/uL — ABNORMAL LOW (ref 4.22–5.81)
RDW: 16.6 % — ABNORMAL HIGH (ref 11.5–15.5)
WBC: 6.5 10*3/uL (ref 4.0–10.5)
nRBC: 1.7 % — ABNORMAL HIGH (ref 0.0–0.2)

## 2022-02-10 LAB — GLUCOSE, CAPILLARY
Glucose-Capillary: 82 mg/dL (ref 70–99)
Glucose-Capillary: 76 mg/dL (ref 70–99)
Glucose-Capillary: 126 mg/dL — ABNORMAL HIGH (ref 70–99)
Glucose-Capillary: 146 mg/dL — ABNORMAL HIGH (ref 70–99)

## 2022-02-10 LAB — BASIC METABOLIC PANEL
Anion gap: 11 (ref 5–15)
BUN: 52 mg/dL — ABNORMAL HIGH (ref 8–23)
CO2: 26 mmol/L (ref 22–32)
Calcium: 8.9 mg/dL (ref 8.9–10.3)
Chloride: 101 mmol/L (ref 98–111)
Creatinine, Ser: 11.05 mg/dL — ABNORMAL HIGH (ref 0.61–1.24)
GFR, Estimated: 5 mL/min — ABNORMAL LOW (ref >60)
Glucose, Bld: 94 mg/dL (ref 70–99)
Potassium: 3.8 mmol/L (ref 3.5–5.1)
Sodium: 138 mmol/L (ref 135–145)

## 2022-02-10 LAB — HEPATITIS B SURFACE ANTIBODY, QUANTITATIVE: Hep B S AB Quant (Post): <3.1 m[IU]/mL — ABNORMAL LOW (ref >9.9)

## 2022-02-10 MED ORDER — GABAPENTIN 100 MG PO CAPS: 100.0000 mg | ORAL_CAPSULE | Freq: Three times a day (TID) | ORAL | Status: DC

## 2022-02-10 MED ORDER — CHLORHEXIDINE GLUCONATE CLOTH 2 % EX PADS
6.0000 | MEDICATED_PAD | Freq: Every day | CUTANEOUS | Status: DC
  Administered 2022-02-11: 6 via TOPICAL

## 2022-02-10 MED ORDER — HEPARIN SODIUM (PORCINE) 1000 UNIT/ML DIALYSIS
2800.0000 [IU] | INTRAMUSCULAR | Status: DC | PRN
Start: 2022-02-10 — End: 2022-02-10

## 2022-02-10 MED ORDER — GABAPENTIN 100 MG PO CAPS
200.0000 mg | ORAL_CAPSULE | Freq: Every day | ORAL | Status: DC
  Administered 2022-02-10 – 2022-02-11 (×2): 200 mg via ORAL
  Filled 2022-02-10 (×2): qty 2

## 2022-02-10 MED ORDER — POLYETHYLENE GLYCOL 3350 17 G PO PACK
17.0000 g | PACK | Freq: Every day | ORAL | Status: DC
  Administered 2022-02-10 – 2022-02-11 (×2): 17 g via ORAL
  Filled 2022-02-10 (×5): qty 1

## 2022-02-10 MED ORDER — HYDROMORPHONE HCL 1 MG/ML IJ SOLN
0.5000 mg | Freq: Once | INTRAMUSCULAR | Status: AC
  Administered 2022-02-10: 0.5 mg via INTRAVENOUS
  Filled 2022-02-10: qty 1

## 2022-02-10 MED ORDER — HYDROMORPHONE HCL 1 MG/ML IJ SOLN: 0.5000 mg | INTRAMUSCULAR | Status: DC | PRN

## 2022-02-10 MED ORDER — HEPARIN SODIUM (PORCINE) 1000 UNIT/ML DIALYSIS
2000.0000 [IU] | INTRAMUSCULAR | Status: DC | PRN
  Administered 2022-02-11: 2000 [IU] via INTRAVENOUS_CENTRAL
  Filled 2022-02-10: qty 2

## 2022-02-10 NOTE — Progress Notes (Signed)
PROGRESS NOTE        PATIENT DETAILS Name: Mark [name] Age: 71 y.o. Sex: male Date of Birth: Oct 03, 1950 Admit Date: 02/09/2022 Admitting Physician Evalee Mutton Kristeen Mans, MD ZOX:[id number], Dalbert Batman, MD  Brief Summary: Patient is a 71 y.o.  male with a history of ESRD on HD TTS, PAD s/p BKA, chronic HFpEF, DM-2, HTN, seizure disorder, IgM MGUS, normocytic anemia who missed HD x2 and presented with volume overload/pulm edema-he was subsequently admitted to the hospitalist service.   Significant events: 6/14>> admit to [facility]-hypoxia-volume overload in the setting of missed HD  Significant studies: 6/13>> CXR: Pulmonary vascular congestion  Significant microbiology data:   Procedures:   Consults: Nephrology  Subjective: Getting second HD earlier today-breathing is better-was on 5-6 L of oxygen overnight.  Complains of left stump pain going to his groin-has worsened over the past several days.  Has been having phantom pain since 2017 but this pain is different-and worse from baseline.  Objective: Vitals: Blood pressure (!) 144/71, pulse 64, temperature 98 F (36.7 C), temperature source Oral, resp. rate 15, weight 106.1 kg, SpO2 97 %.   Exam: Gen Exam:Alert awake-not in any distress HEENT:atraumatic, normocephalic Chest: B/L clear to auscultation anteriorly CVS:S1S2 regular Abdomen:soft non tender, non distended Extremities: Left BKA-stump appears intact.  Left side does not appear swollen. Neurology: Non focal Skin: no rash   Pertinent Labs/Radiology:    Latest Ref Rng & Units 02/10/2022    2:34 AM 02/09/2022   10:10 AM 01/12/2022   10:23 AM  CBC  WBC 4.0 - 10.5 K/uL 6.5  7.2  7.6   Hemoglobin 13.0 - 17.0 g/dL 7.7  7.6  7.9   Hematocrit 39.0 - 52.0 % 24.1  24.2  23.6   Platelets 150 - 400 K/uL 442  420  346     Lab Results  Component Value Date   NA 138 02/10/2022   K 3.8 02/10/2022   CL 101 02/10/2022   CO2 26 02/10/2022       Assessment/Plan: Acute hypoxic respiratory failure due to volume overload-with acute on chronic HFpEF-in the setting of missed HD: Hypoxia better-after second HD earlier this morning-was on 5-6 L of oxygen overnight.  Have asked nursing staff to titrate and monitor how he does on room air.  If he is still persistently hypoxic-we will require repeat imaging.   Hyperkalemia: Mild-secondary to missed HD-for HD later today.  Resolved with HD.   Right foot ulceration: Claims this was due to a new shoe-wound care following.   Normocytic anemia: Due to combination of IgM MGUS and ESRD.  Defer Aranesp/iron to nephrology service  Left stump to left groin pain: Unclear etiology-claims this is different from phantom pain.  Claims he is unable to ambulate due to severe pain.  Exam is benign-we will check Dopplers-increase Neurontin to 3 times daily dosing.  Continue supportive care as needed narcotics-ambulate with PT/OT and see how he does.   DM-2 (A1c 8.5 on 08/31/2021): CBG stable with Semglee 15 units daily and SSI.    Recent Labs    02/09/22 1852 02/09/22 2053 02/10/22 1125  GLUCAP 88 108* 76      Hypertension: BP stable-continue Coreg-has history of orthostatic hypotension-allow some amount of resting hypertension.   History of orthostatic hypotension: Apparently also takes midodrine-allow some amount of resting hypertension.  HLD: Continue statin  PAD s/p left BKA: See above regarding neuropathic pain and plans to uptitrate Neurontin.   Debility/deconditioning: Claims he has had difficulty ambulating-due to severe left leg stump pain.  See above-await PT/OT eval  Obesity: Estimated body mass index is 30.03 kg/m as calculated from the following:   Height as of 12/23/21: '6\' 2"'$  (1.88 m).   Weight as of this encounter: 106.1 kg.   Code status:   Code Status: Full Code   DVT Prophylaxis: heparin injection 5,000 Units Start: 02/09/22 1400   Family Communication: None at  bedside  Disposition Plan: Status is: Observation The patient will require care spanning > 2 midnights and should be moved to inpatient because: Improving hypoxia due to volume overload-titrating of oxygen-getting second HD today-severe LLE pain-unable to ambulate-awaiting PT/OT eval.  Not stable for discharge today-May require SNF depending on his mobility with PT/OT.   Planned Discharge Destination:Home health versus SNF   Diet: Diet Order             Diet renal with fluid restriction Fluid restriction: 1200 mL Fluid; Room service appropriate? Yes; Fluid consistency: Thin  Diet effective now                     Antimicrobial agents: Anti-infectives (From admission, onward)    None        MEDICATIONS: Scheduled Meds:  atorvastatin  20 mg Oral Daily   [START ON 02/11/2022] calcitRIOL  1.25 mcg Oral Q T,Th,Sa-HD   carvedilol  25 mg Oral BID   Chlorhexidine Gluconate Cloth  6 each Topical Q0600   Chlorhexidine Gluconate Cloth  6 each Topical Q0600   Chlorhexidine Gluconate Cloth  6 each Topical Q0600   [START ON 02/11/2022] cinacalcet  120 mg Oral Q T,Th,Sa-HD   darbepoetin (ARANESP) injection - DIALYSIS  60 mcg Intravenous Q Tue-HD   furosemide  80 mg Oral Daily   gabapentin  100 mg Oral BID   heparin  5,000 Units Subcutaneous Q8H   insulin aspart  0-6 Units Subcutaneous TID WC   insulin glargine-yfgn  15 Units Subcutaneous QHS   multivitamin with minerals  1 tablet Oral Daily   sucroferric oxyhydroxide  1,000 mg Oral TID WC   Continuous Infusions: PRN Meds:.acetaminophen **OR** acetaminophen, albuterol, ondansetron **OR** ondansetron (ZOFRAN) IV, oxyCODONE, polyethylene glycol   I have personally reviewed following labs and imaging studies  LABORATORY DATA: CBC: Recent Labs  Lab 02/09/22 1010 02/10/22 0234  WBC 7.2 6.5  NEUTROABS 4.0  --   HGB 7.6* 7.7*  HCT 24.2* 24.1*  MCV 97.6 94.5  PLT 420* 442*    Basic Metabolic Panel: Recent Labs  Lab  02/09/22 1010 02/09/22 2128 02/10/22 0234  NA 143  --  138  K 5.2*  --  3.8  CL 109  --  101  CO2 17*  --  26  GLUCOSE 127*  --  94  BUN 109*  --  52*  CREATININE 19.63*  --  11.05*  CALCIUM 9.4  --  8.9  PHOS  --  4.1  --     GFR: Estimated Creatinine Clearance: 8.1 mL/min (A) (by C-G formula based on SCr of 11.05 mg/dL (H)).  Liver Function Tests: Recent Labs  Lab 02/09/22 1010  AST 6*  ALT 8  ALKPHOS 55  BILITOT 0.7  PROT 7.0  ALBUMIN 3.1*   No results for input(s): "LIPASE", "AMYLASE" in the last 168 hours. No results for input(s): "AMMONIA" in the last 168 hours.  Coagulation Profile: No results for input(s): "INR", "PROTIME" in the last 168 hours.  Cardiac Enzymes: No results for input(s): "CKTOTAL", "CKMB", "CKMBINDEX", "TROPONINI" in the last 168 hours.  BNP (last 3 results) No results for input(s): "PROBNP" in the last 8760 hours.  Lipid Profile: No results for input(s): "CHOL", "HDL", "LDLCALC", "TRIG", "CHOLHDL", "LDLDIRECT" in the last 72 hours.  Thyroid Function Tests: No results for input(s): "TSH", "T4TOTAL", "FREET4", "T3FREE", "THYROIDAB" in the last 72 hours.  Anemia Panel: No results for input(s): "VITAMINB12", "FOLATE", "FERRITIN", "TIBC", "IRON", "RETICCTPCT" in the last 72 hours.  Urine analysis:    Component Value Date/Time   COLORURINE STRAW (A) 08/31/2021 1007   APPEARANCEUR CLEAR 08/31/2021 1007   LABSPEC 1.009 08/31/2021 1007   PHURINE 8.0 08/31/2021 1007   GLUCOSEU >=500 (A) 08/31/2021 1007   HGBUR SMALL (A) 08/31/2021 1007   BILIRUBINUR NEGATIVE 08/31/2021 1007   KETONESUR 5 (A) 08/31/2021 1007   PROTEINUR 100 (A) 08/31/2021 1007   UROBILINOGEN 0.2 04/18/2015 1544   NITRITE NEGATIVE 08/31/2021 1007   LEUKOCYTESUR NEGATIVE 08/31/2021 1007    Sepsis Labs: Lactic Acid, Venous    Component Value Date/Time   LATICACIDVEN 0.80 08/31/2017 1325    MICROBIOLOGY: No results found for this or any previous visit (from the  past 240 hour(s)).  RADIOLOGY STUDIES/RESULTS: DG Chest Portable 1 View  Result Date: 02/09/2022 CLINICAL DATA:  Provided history: Dyspnea. Additional history provided: Shortness of breath and cough for 2 days. Left leg swelling. Patient reports missing dialysis treatments. EXAM: PORTABLE CHEST 1 VIEW COMPARISON:  Prior chest radiographs 11/19/2021 and earlier. FINDINGS: Cardiomegaly with central pulmonary vascular congestion. Aortic atherosclerosis. Interstitial and basilar dependent ill-defined airspace opacities (right greater than left). No sizable pleural effusion or evidence of pneumothorax. No acute bony abnormality identified. IMPRESSION: Cardiomegaly with central pulmonary vascular congestion. Associated interstitial and basilar dependent ill-defined airspace opacities (right greater than left), likely reflecting pulmonary edema. Aortic Atherosclerosis (ICD10-I70.0). Electronically Signed   By: Kellie Simmering D.O.   On: 02/09/2022 10:31     LOS: 0 days   Oren Binet, MD  [facility]    To contact the attending provider between 7A-7P or the covering provider during after hours 7P-7A, please log into the web site [email or web address] and access using universal [place] password for that web site. If you do not have the password, please call the hospital operator.  02/10/2022, 11:31 AM

## 2022-02-10 NOTE — Progress Notes (Signed)
Left lower extremity venous duplex has been completed. Preliminary results can be found in CV Proc through chart review.   02/10/22 4:49 PM Carlos Levering RVT

## 2022-02-10 NOTE — Progress Notes (Signed)
Received patient in bed, alert and oriented. Informed consent signed and in chart.  Time tx initiated: 0713  Pre HD weight: 109.8 kg  Pre HD VS: 143/81 BP Temp 97.4  02: 96% Resp: 13 on 2L  Time tx completed: 1041   HD treatment completed. Patient tolerated well. Fistula catheter without signs and symptoms of complications. Patient transported back to the room, alert and orient and in no acute distress. Report given to bedside RN.  Total UF removed: 3L  Medication given: Oxycodone '5mg'$   Post HD VS: 134/66 BP 98- Temp 18- Resp O2-98%  Post HD weight: 106.1 kg

## 2022-02-10 NOTE — Progress Notes (Signed)
Pain medicine given for complaints of 7/10 pain in groin area. Sips of water given with medicine. Client laying in semi fowler position with eyes closed. Breathing even and unlabored. Will continue to monitor.

## 2022-02-10 NOTE — Progress Notes (Signed)
OT Cancellation Note  Patient Details Name: Mark [name] MRN: [id number] DOB: 12/31/1950   Cancelled Treatment:    Reason Eval/Treat Not Completed: Patient at procedure or test/ unavailable (HD)  [name],[name] 02/10/2022, 7:03 AM Maurie Boettcher, OT/L   Acute OT Clinical Specialist [place] Pager [phone] Office [phone]

## 2022-02-10 NOTE — Progress Notes (Signed)
PT Cancellation Note  Patient Details Name: Mark [name] MRN: [id number] DOB: [birth year]   Cancelled Treatment:    Reason Eval/Treat Not Completed: Patient at procedure or test/unavailable  Currently in HD;  Will follow up later today as time allows;  Otherwise, will follow up for PT tomorrow;   Thank you,  Roney [name], [place] Office [place] 02/10/2022, 8:30 AM

## 2022-02-10 NOTE — Progress Notes (Signed)
Pt receives out-pt HD at [facility] on TTS. Pt arrives at 10:35 for 10:55 chair time. Will assist as needed.   Melven Sartorius Renal Navigator [phone]

## 2022-02-10 NOTE — Progress Notes (Signed)
[facility] Progress Note  Subjective: 3.2 L UF yesterday, seen in HD. Main c/o is L leg pain. No SOB.   Vitals:   02/10/22 0800 02/10/22 0807 02/10/22 0838 02/10/22 0900  BP: (!) 144/69 (!) 144/69 133/67 121/64  Pulse: 61 60 62 60  Resp:  '11 12 11  '$ Temp:      TempSrc:      SpO2: 92% 97% 98% 99%  Weight:        Exam: Gen alert, no distress No jvd or bruits Chest clear bilat, no wheezing RRR no MRG Abd soft ntnd no mass or ascites +bs MS L BKA w/ prosthetic Ext 1+ RLE pretib edema Neuro is alert, Ox 3 , nf    LUA AVF+bruit          Home meds include - acetaminophen, carvedilol 25 bid, furosemide 80 qd, insulin glargine, lanthanum 1 gm ac, midodrine 5 bid, MVI, vit D, atorvastatin, ondansetron prn        OP HD: [facility] [place] 4h  400/800   112.5kg   2/2 bath  LUA AVF  Heparin 5700  - mircera 60 ug q2, last 5/18, overdue - iron sucrose, 50 mg q wk - calcitriol 1.25 mcg po tiw  - cinacalcet 120 mg po tiw q snack - last HD 6/06, came in 113.7 and left 113.0 - last Hb 7.7 on 6/06     Assessment/ Plan: AHRF - due  to missed HD w/ vol overload, initial CXR w/ pulm edema. 3 L UF w/ HD yest, SOB better today. 10kg under dry wt. Lower vol further w/ HD today.  ESRD - TTS HD. Missed 2-3 outpt HD recently. Extra HD today for vol / solute excess.  BP/ volume - BP's good, holding midodrine for now. Getting home coreg/ lasix here.  R foot wounds - per pmd PAD sp L BKA Anemia esrd - Hb low here as in OP setting. Transfuse if Hb <  7. Esa overdue, getting darbe 60 ug weekly on Tuesday here, 1st dose 6/13.  MBD ckd - CCa and phos in range. Cont po vdra, sensipar and binder.           Mark [name] 02/10/2022, 9:11 AM   Recent Labs  Lab 02/09/22 1010 02/09/22 2128 02/10/22 0234  HGB 7.6*  --  7.7*  ALBUMIN 3.1*  --   --   CALCIUM 9.4  --  8.9  PHOS  --  4.1  --   CREATININE 19.63*  --  11.05*  K 5.2*  --  3.8   Inpatient medications:  atorvastatin  20 mg  Oral Daily   [START ON 02/11/2022] calcitRIOL  1.25 mcg Oral Q T,Th,Sa-HD   carvedilol  25 mg Oral BID   Chlorhexidine Gluconate Cloth  6 each Topical Q0600   Chlorhexidine Gluconate Cloth  6 each Topical Q0600   [START ON 02/11/2022] cinacalcet  120 mg Oral Q T,Th,Sa-HD   darbepoetin (ARANESP) injection - DIALYSIS  60 mcg Intravenous Q Tue-HD   furosemide  80 mg Oral Daily   gabapentin  100 mg Oral BID   heparin  5,000 Units Subcutaneous Q8H   insulin aspart  0-6 Units Subcutaneous TID WC   insulin glargine-yfgn  15 Units Subcutaneous QHS   multivitamin with minerals  1 tablet Oral Daily   sucroferric oxyhydroxide  1,000 mg Oral TID WC    acetaminophen **OR** acetaminophen, albuterol, heparin, ondansetron **OR** ondansetron (ZOFRAN) IV, oxyCODONE, polyethylene glycol

## 2022-02-10 NOTE — Progress Notes (Signed)
We will reduce his gabapentin to '200mg'$  qhs due to his renal function and formulation per Dr. Sloan Leiter.  Onnie Boer, PharmD, BCIDP, AAHIVP, CPP Infectious Disease Pharmacist 02/10/2022 2:44 PM

## 2022-02-10 NOTE — Progress Notes (Signed)
PT Cancellation Note  Patient Details Name: SHERI PROWS MRN: [id number] DOB: 06-Jun-1951   Cancelled Treatment:    Reason Eval/Treat Not Completed: Other (comment)  Politely declining at this time;  Wants to eat lunch;   Will follow up later today as time allows;  Otherwise, will follow up for PT tomorrow;   Thank you,  Roney [name], [place] Office [place] 02/10/2022, 1:14 PM

## 2022-02-11 ENCOUNTER — Inpatient Hospital Stay (HOSPITAL_COMMUNITY): Payer: Medicare Other

## 2022-02-11 DIAGNOSIS — I5032 Chronic diastolic (congestive) heart failure: Secondary | ICD-10-CM | POA: Diagnosis not present

## 2022-02-11 DIAGNOSIS — I10 Essential (primary) hypertension: Secondary | ICD-10-CM | POA: Diagnosis not present

## 2022-02-11 DIAGNOSIS — E8770 Fluid overload, unspecified: Secondary | ICD-10-CM | POA: Diagnosis not present

## 2022-02-11 DIAGNOSIS — N186 End stage renal disease: Secondary | ICD-10-CM | POA: Diagnosis not present

## 2022-02-11 LAB — GLUCOSE, CAPILLARY
Glucose-Capillary: 110 mg/dL — ABNORMAL HIGH (ref 70–99)
Glucose-Capillary: 107 mg/dL — ABNORMAL HIGH (ref 70–99)
Glucose-Capillary: 153 mg/dL — ABNORMAL HIGH (ref 70–99)
Glucose-Capillary: 142 mg/dL — ABNORMAL HIGH (ref 70–99)

## 2022-02-11 MED ORDER — LIDOCAINE 5 % EX PTCH
1.0000 | MEDICATED_PATCH | Freq: Every day | CUTANEOUS | Status: DC
  Administered 2022-02-11 – 2022-02-13 (×3): 1 via TRANSDERMAL
  Filled 2022-02-11 (×6): qty 1

## 2022-02-11 NOTE — Evaluation (Signed)
Physical Therapy Evaluation Patient Details Name: Mark [name] MRN: [id number] DOB: 1951/02/25 Today's Date: 02/11/2022  History of Present Illness  71 y/o male presented to ED on 02/09/22 for SOB, cough, and L LE swelling. CXR showed bilateral pulmonary edema. Admitted for acute hypoxic respiratory failure due to volume overload. PMH: ESRD on HD, PAD s/p L BKA, chronic HFpEF, T2DM, HTN, seizures, anemia  Clinical Impression  Patient admitted with the above. PTA, patient lives with wife and was able to don/doff prosthesis and transfer independently while utilizing power w/c for mobility. Patient with difficulty donning L prosthesis 2/2 swelling. Encouraged him to contact his wife to bring shrinker in to combat the swelling to be able to effectively utilize prosthesis. Patient required minA+2 for sit to stand from very elevated surface and took sidesteps towards [facility]. Deferred further mobility due to inadequate fitting of prosthesis. Patient will benefit from skilled PT services during acute stay to address listed deficits. Recommend HHPT at discharge to maximize functional independence and strengthening.        Recommendations for follow up therapy are one component of a multi-disciplinary discharge planning process, led by the attending physician.  Recommendations may be updated based on patient status, additional functional criteria and insurance authorization.  Follow Up Recommendations Home health PT    Assistance Recommended at Discharge Frequent or constant Supervision/Assistance  Patient can return home with the following  A little help with walking and/or transfers;A little help with bathing/dressing/bathroom;Assistance with cooking/housework;Assist for transportation;Help with stairs or ramp for entrance    Equipment Recommendations Wheelchair (measurements PT);Wheelchair cushion (measurements PT)  Recommendations for Other Services       Functional Status Assessment Patient has had  a recent decline in their functional status and demonstrates the ability to make significant improvements in function in a reasonable and predictable amount of time.     Precautions / Restrictions Precautions Precautions: Fall Precaution Comments: L BKA with prosthesis Restrictions Weight Bearing Restrictions: No      Mobility  Bed Mobility Overal bed mobility: Needs Assistance Bed Mobility: Supine to Sit     Supine to sit: Mod assist     General bed mobility comments: assist for bringing trunk up to sitting and scooting to the edge of the bed    Transfers Overall transfer level: Needs assistance Equipment used: Rolling Monty Spicher (2 wheels) Transfers: Sit to/from Stand, Bed to chair/wheelchair/BSC Sit to Stand: Min assist, +2 physical assistance, From elevated surface   Step pivot transfers: Min assist, +2 physical assistance       General transfer comment: able to stand from elevated surface with minA+2 and take side steps towards [facility]    Ambulation/Gait               General Gait Details: deferred further gait due to inadequate fitting of prosthesis  Stairs            Wheelchair Mobility    Modified Rankin (Stroke Patients Only)       Balance Overall balance assessment: Needs assistance Sitting-balance support: No upper extremity supported Sitting balance-Leahy Scale: Fair Sitting balance - Comments: Initial assist needed but then he was able to maintain without assist after scooting forward out of the middle of the bed.   Standing balance support: Bilateral upper extremity supported, Reliant on assistive device for balance Standing balance-Leahy Scale: Poor Standing balance comment: Needs BUE support for balance with use of the RW and therapists assisting.  Pertinent Vitals/Pain Pain Assessment Pain Assessment: No/denies pain    Home Living Family/patient expects to be discharged to:: Private  residence Living Arrangements: Spouse/significant other Available Help at Discharge: Family Type of Home: House Home Access: Ramped entrance       Home Layout: One level Home Equipment: Conservation officer, nature (2 wheels);Shower seat;Wheelchair - power Additional Comments: Pt used the power wheelchair most of the time in the house    Prior Function Prior Level of Function : Independent/Modified Independent             Mobility Comments: Pt used the power wheelchair most of the time in the house but did use the RW for mobility in the house as well. ADLs Comments: He reports he was able to complete ADLs.     Hand Dominance   Dominant Hand: Right    Extremity/Trunk Assessment   Upper Extremity Assessment Upper Extremity Assessment: Defer to OT evaluation RUE Deficits / Details: Shoulder flexion AROM 0-110 degrees with pain, strength 2+/5.  Grip and elbow flexion/extension 4/5 throughout. RUE Sensation: WNL RUE Coordination: decreased gross motor LUE Deficits / Details: Shoulder flexion 0-90 degrees AROM with AAROM 0-130 degrees strength 2+/5,  all other joints of the elbow and hand WFLS with strength 4/5 LUE Sensation: WNL LUE Coordination: WNL    Lower Extremity Assessment Lower Extremity Assessment: Generalized weakness (L LE swelling making it difficult to don prosthesis)    Cervical / Trunk Assessment Cervical / Trunk Assessment: Kyphotic  Communication   Communication: No difficulties  Cognition Arousal/Alertness: Awake/alert Behavior During Therapy: WFL for tasks assessed/performed Overall Cognitive Status: No family/caregiver present to determine baseline cognitive functioning                                 General Comments: Pt oriented to place, time, situation, but inconsistent at times with answering questions asked regarding PLOF.  Will continue to assess further in treatment.        General Comments      Exercises     Assessment/Plan    PT  Assessment Patient needs continued PT services  PT Problem List Decreased strength;Decreased activity tolerance;Decreased balance;Decreased mobility       PT Treatment Interventions DME instruction;Gait training;Therapeutic exercise;Functional mobility training;Therapeutic activities;Balance training;Patient/family education    PT Goals (Current goals can be found in the Care Plan section)  Acute Rehab PT Goals Patient Stated Goal: to get stronger PT Goal Formulation: With patient Time For Goal Achievement: 02/25/22 Potential to Achieve Goals: Good    Frequency Min 3X/week     Co-evaluation PT/OT/SLP Co-Evaluation/Treatment: Yes Reason for Co-Treatment: For patient/therapist safety;To address functional/ADL transfers PT goals addressed during session: Mobility/safety with mobility;Balance OT goals addressed during session: ADL's and self-care       AM-PAC PT "6 Clicks" Mobility  Outcome Measure Help needed turning from your back to your side while in a flat bed without using bedrails?: A Little Help needed moving from lying on your back to sitting on the side of a flat bed without using bedrails?: A Lot Help needed moving to and from a bed to a chair (including a wheelchair)?: Total Help needed standing up from a chair using your arms (e.g., wheelchair or bedside chair)?: Total Help needed to walk in hospital room?: Total Help needed climbing 3-5 steps with a railing? : Total 6 Click Score: 9    End of Session   Activity Tolerance: Patient tolerated  treatment well Patient left: in bed;with call bell/phone within reach (sitting EOB) Nurse Communication: Mobility status;Other (comment) (sitting EOB) PT Visit Diagnosis: Unsteadiness on feet (R26.81);Muscle weakness (generalized) (M62.81)    Time: 9833-8250 PT Time Calculation (min) (ACUTE ONLY): 35 min   Charges:   PT Evaluation $PT Eval Moderate Complexity: 1 Mod          Stormi Vandevelde A. Gilford Rile PT, DPT Acute  Rehabilitation Services Office [phone]   Linna Hoff 02/11/2022, 4:51 PM

## 2022-02-11 NOTE — Progress Notes (Signed)
Received patient in bed, alert and oriented. Informed consent signed and in chart.  Time tx [id number]  Pre HD weight:110.2  Pre HD VS:see chart  Time tx completed:1219  HD treatment completed. Patient tolerated well. Fistula/Graft/HD catheter without signs and symptoms of complications. Patient transported back to the room, alert and orient and in no acute distress. Report given to bedside RN.  Total UF removed: -0.6  Medication given: Calcitriol 125 mcg  Post HD VS: see chart  Post HD weight: 109.4

## 2022-02-11 NOTE — Evaluation (Signed)
Occupational Therapy Evaluation Patient Details Name: Mark [name] MRN: [id number] DOB: 20-Apr-1951 Today's Date: 02/11/2022   History of Present Illness Patient is a 71 y.o.  male with a history of ESRD on HD TTS, PAD s/p BKA, chronic HFpEF, DM-2, HTN, seizure disorder, IgM MGUS, normocytic anemia who missed HD x2 and presented with volume overload/pulm edema-he was subsequently admitted to the hospitalist   Clinical Impression   Pt with increased difficulty donning his left prosthetic secondary to slight swelling.  Instructed him to have his spouse bring in his shrinker to wear when not in the prosthesis to assist with this.  Min assist +2 for sit to stand and for stepping up toward the top of the bed.  Mod assist for LB bathing and dressing tasks simulated.  Feel he will benefit from acute care OT to help increase ADL performance back to supervision/modified independent baseline.        Recommendations for follow up therapy are one component of a multi-disciplinary discharge planning process, led by the attending physician.  Recommendations may be updated based on patient status, additional functional criteria and insurance authorization.   Follow Up Recommendations  Home health OT    Assistance Recommended at Discharge Frequent or constant Supervision/Assistance  Patient can return home with the following A little help with walking and/or transfers;A little help with bathing/dressing/bathroom;Assistance with cooking/housework;Assist for transportation    Functional Status Assessment  Patient has had a recent decline in their functional status and demonstrates the ability to make significant improvements in function in a reasonable and predictable amount of time.  Equipment Recommendations  None recommended by OT       Precautions / Restrictions Precautions Precautions: Fall Precaution Comments: left BKA Restrictions Weight Bearing Restrictions: No      Mobility Bed  Mobility Overal bed mobility: Needs Assistance Bed Mobility: Supine to Sit     Supine to sit: Mod assist     General bed mobility comments: assist for bringing trunk up to sitting and scooting to the edge of the bed    Transfers Overall transfer level: Needs assistance Equipment used: Rolling walker (2 wheels) Transfers: Sit to/from Stand, Bed to chair/wheelchair/BSC Sit to Stand: +2 physical assistance, Min assist     Step pivot transfers: Min assist, +2 physical assistance            Balance Overall balance assessment: Needs assistance Sitting-balance support: No upper extremity supported Sitting balance-Leahy Scale: Fair Sitting balance - Comments: Initial assist needed but then he was able to maintain without assist after scooting forward out of the middle of the bed.   Standing balance support: Bilateral upper extremity supported, Reliant on assistive device for balance Standing balance-Leahy Scale: Poor Standing balance comment: Needs BUE support for balance with use of the RW and therapists assisting.                           ADL either performed or assessed with clinical judgement   ADL Overall ADL's : Needs assistance/impaired Eating/Feeding: Independent;Sitting   Grooming: Wash/dry hands;Wash/dry face;Set up;Sitting   Upper Body Bathing: Supervision/ safety;Sitting   Lower Body Bathing: Minimal assistance;+2 for physical assistance Lower Body Bathing Details (indicate cue type and reason): simulated     Lower Body Dressing: Moderate assistance;Sit to/from stand Lower Body Dressing Details (indicate cue type and reason): including prosthesis Toilet Transfer: Minimal assistance;+2 for physical assistance;Rolling walker (2 wheels)   Toileting- Clothing Manipulation and Hygiene: Minimal  assistance;+2 for physical assistance;Sit to/from stand       Functional mobility during ADLs: Minimal assistance;+2 for physical assistance;Rolling walker (2  wheels) General ADL Comments: Pt unable to get his LLE prosthesis to lock in while sitting secondary to slight edema. Therapist assisted as well and was unsuccessful in sitting.  Had pt stand with use of the RW and then he was able to get it to lock.  He took a few steps up to the side of the bed with use of the RW at min asssit +2 for safety before sitting back down.     Vision Baseline Vision/History: 0 No visual deficits Ability to See in Adequate Light: 0 Adequate Patient Visual Report: No change from baseline Vision Assessment?: No apparent visual deficits     Perception Perception Perception: Within Functional Limits       Pertinent Vitals/Pain Pain Assessment Pain Assessment: No/denies pain     Hand Dominance Right   Extremity/Trunk Assessment Upper Extremity Assessment Upper Extremity Assessment: RUE deficits/detail;LUE deficits/detail RUE Deficits / Details: Shoulder flexion AROM 0-110 degrees with pain, strength 2+/5.  Grip and elbow flexion/extension 4/5 throughout. RUE Sensation: WNL RUE Coordination: decreased gross motor LUE Deficits / Details: Shoulder flexion 0-90 degrees AROM with AAROM 0-130 degrees strength 2+/5,  all other joints of the elbow and hand WFLS with strength 4/5 LUE Sensation: WNL LUE Coordination: WNL   Lower Extremity Assessment Lower Extremity Assessment: Defer to PT evaluation   Cervical / Trunk Assessment Cervical / Trunk Assessment: Normal   Communication Communication Communication: No difficulties   Cognition Arousal/Alertness: Awake/alert Behavior During Therapy: WFL for tasks assessed/performed Overall Cognitive Status: No family/caregiver present to determine baseline cognitive functioning                                 General Comments: Pt oriented to place, time, situation, but inconsistent at times with answering questions asked regarding PLOF.  Will continue to assess further in treatment.                 Home Living Family/patient expects to be discharged to:: Private residence Living Arrangements: Spouse/significant other   Type of Home: House Home Access: [place]: One level     Bathroom Shower/Tub: Teacher, early years/pre: [place]: Conservation officer, nature (2 wheels);Shower seat;Wheelchair - power   Additional Comments: Pt used the power wheelchair most of the time in the house      Prior Functioning/Environment Prior Level of Function : Independent/Modified Independent             Mobility Comments: Pt used the power wheelchair most of the time in the house but did use the RW for mobility in the house as well. ADLs Comments: He reports he was able to complete ADLs.        OT Problem List: Decreased strength;Decreased range of motion;Decreased activity tolerance;Impaired balance (sitting and/or standing);Decreased knowledge of use of DME or AE      OT Treatment/Interventions: Self-care/ADL training;Patient/family education;Balance training;Neuromuscular education;Therapeutic activities;Energy conservation;DME and/or AE instruction;Therapeutic exercise    OT Goals(Current goals can be found in the care plan section) Acute Rehab OT Goals Patient Stated Goal: Pt did not state this session but agreeable to participation in OT. OT Goal Formulation: With patient Time For Goal Achievement: 02/25/22 Potential to Achieve Goals: Good  OT Frequency: Min 2X/week  Co-evaluation PT/OT/SLP Co-Evaluation/Treatment: Yes Reason for Co-Treatment: For patient/therapist safety   OT goals addressed during session: ADL's and self-care      AM-PAC OT "6 Clicks" Daily Activity     Outcome Measure Help from another person eating meals?: None Help from another person taking care of personal grooming?: A Little Help from another person toileting, which includes using toliet, bedpan, or urinal?: A Little Help from another person  bathing (including washing, rinsing, drying)?: A Lot Help from another person to put on and taking off regular upper body clothing?: A Little Help from another person to put on and taking off regular lower body clothing?: A Lot 6 Click Score: 17   End of Session Equipment Utilized During Treatment: Gait belt;Rolling walker (2 wheels) Nurse Communication: Mobility status;Other (comment) (Pt sitting on EOB eating lunch)  Activity Tolerance: Patient limited by fatigue Patient left: in bed;with call bell/phone within reach  OT Visit Diagnosis: Unsteadiness on feet (R26.81);Muscle weakness (generalized) (M62.81)                Time: 9604-5409 OT Time Calculation (min): 41 min Charges:  OT General Charges $OT Visit: 1 Visit OT Evaluation $OT Eval Moderate Complexity: 1 Mod OT Treatments $Self Care/Home Management : 8-22 mins Neoma Uhrich OTR/L 02/11/2022, 4:30 PM

## 2022-02-11 NOTE — Progress Notes (Signed)
TRH night cross cover note:  I was notified by RN that patient is complaining of some gassy bloating abdominal discomfort, noting that the patient did not receive his scheduled dose of MiraLAX earlier in the day.  I conveyed to RN that it is okay to give the patient's missed dose of MiraLAX now.   I also noted that the patient has existing orders for prn IV Dilaudid as well as as needed oxycodone.     Babs Bertin, DO Hospitalist

## 2022-02-11 NOTE — Progress Notes (Signed)
[facility] Progress Note   Subjective:   Patient seen in HD. Denies SOB, CP, dizziness, abdominal pain and nausea. BP soft at the start of HD and no volume on exam, so not set for UF today.   Objective Vitals:   02/10/22 2057 02/11/22 0442 02/11/22 0820 02/11/22 0904  BP: 134/61 (!) 117/42 133/68 (!) 107/54  Pulse: 69 70 73 72  Resp: '18 18 18 18  '$ Temp: 98 F (36.7 C) 98.6 F (37 C) 98.6 F (37 C)   TempSrc: Oral Oral Oral   SpO2: 94% 95% 95% 97%  Weight:       Physical Exam General: WDWN male, alert and in NAD Heart: RRR, no murmurs, rubs or gallops Lungs: CTA anteriorly without wheezing, rhonchi or rales Abdomen: Soft, non-tender, non-distended, +BS Extremities: R foot bandaged, no edema. L BKA Dialysis Access: LUE AVF accessed  Additional Objective Labs: Basic Metabolic Panel: Recent Labs  Lab 02/09/22 1010 02/09/22 2128 02/10/22 0234  NA 143  --  138  K 5.2*  --  3.8  CL 109  --  101  CO2 17*  --  26  GLUCOSE 127*  --  94  BUN 109*  --  52*  CREATININE 19.63*  --  11.05*  CALCIUM 9.4  --  8.9  PHOS  --  4.1  --    Liver Function Tests: Recent Labs  Lab 02/09/22 1010  AST 6*  ALT 8  ALKPHOS 55  BILITOT 0.7  PROT 7.0  ALBUMIN 3.1*   No results for input(s): "LIPASE", "AMYLASE" in the last 168 hours. CBC: Recent Labs  Lab 02/09/22 1010 02/10/22 0234  WBC 7.2 6.5  NEUTROABS 4.0  --   HGB 7.6* 7.7*  HCT 24.2* 24.1*  MCV 97.6 94.5  PLT 420* 442*   Blood Culture    Component Value Date/Time   SDES  08/31/2021 1255    SPUTUM Performed at [facility], [place] [address], [place], [place] [address]    SDES SPUTUM 08/31/2021 1255   SPECREQUEST  08/31/2021 1255    NONE Performed at [facility], [place] [address], [place], [place] [address]    SPECREQUEST NONE Reflexed from [id number] 08/31/2021 1255   CULT  08/31/2021 1255    FEW Normal respiratory flora-no Staph aureus or Pseudomonas  seen Performed at [place] [address] [place], [place] [address]    REPTSTATUS 08/31/2021 FINAL 08/31/2021 1255   REPTSTATUS 09/03/2021 FINAL 08/31/2021 1255    Cardiac Enzymes: No results for input(s): "CKTOTAL", "CKMB", "CKMBINDEX", "TROPONINI" in the last 168 hours. CBG: Recent Labs  Lab 02/09/22 2053 02/10/22 1125 02/10/22 1650 02/10/22 2059 02/11/22 0820  GLUCAP 108* 76 126* 146* 110*   Iron Studies: No results for input(s): "IRON", "TIBC", "TRANSFERRIN", "FERRITIN" in the last 72 hours. '@lablastinr3'$ @ Studies/Results: DG HIP UNILAT WITH PELVIS 2-3 VIEWS LEFT  Result Date: 02/11/2022 CLINICAL DATA:  Pain. EXAM: DG HIP (WITH OR WITHOUT PELVIS) 2-3V LEFT COMPARISON:  Bone survey 01/04/2022 FINDINGS: There is diffuse decreased bone mineralization. Partial visualization of right cephalomedullary nail fixation of the proximal right femur with chronic lesser trochanter presumably posttraumatic cortical abnormality. Mild bilateral femoroacetabular joint space narrowing. Mild bilateral sacroiliac subchondral sclerosis degenerative changes. No definite acute fracture. Vascular phleboliths overlie the pelvis. Moderate arterial vascular calcifications. IMPRESSION: 1. Within the limitations of diffuse decreased bone mineralization, no acute fracture is seen. 2. Partial visualization of right cephalomedullary nail ORIF of the proximal right femur.  Electronically Signed   By: Yvonne Kendall M.D.   On: 02/11/2022 08:23   DG Knee Complete 4 Views Left  Result Date: 02/11/2022 CLINICAL DATA:  Pain. EXAM: LEFT KNEE - COMPLETE 4+ VIEW COMPARISON:  Bone survey 01/04/2022. FINDINGS: There is diffuse decreased bone mineralization. Postsurgical changes are again seen of left below-the-knee amputation the level of the proximal tibial and fibular diaphyses. The distal surgical bone margin appears sharp without erosion. Moderate to severe medial and moderate lateral compartment joint space  narrowing. Severe patellofemoral joint space narrowing with bone-on-bone contact and moderate superior greater than inferior patellar degenerative osteophytes. No acute fracture is seen. No dislocation. No significant joint effusion. Vascular calcifications are noted. IMPRESSION: 1. Status post below-the-knee amputation without cortical erosion seen to indicate radiographic evidence of acute osteomyelitis. 2. 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 Electronically Signed   By: Yvonne Kendall M.D.   On: 02/11/2022 08:13   VAS [place] LOWER EXTREMITY VENOUS (DVT)  Result Date: 02/10/2022  Lower Venous DVT Study Patient Name:  BADEN BETSCH  Date of Exam:   02/10/2022 Medical Rec #: [id number]         Accession #:    [id number] Date of Birth: [birth year]          Patient Gender: M Patient Age:   71 years Exam Location:  [facility] Procedure:      VAS [place] LOWER EXTREMITY VENOUS (DVT) Referring Phys: Oren Binet --------------------------------------------------------------------------------  Indications: Pain.  Risk Factors: None identified. Limitations: Left BKA. Comparison Study: No prior studies. Performing Technologist: Oliver Hum RVT  Examination Guidelines: A complete evaluation includes B-mode imaging, spectral Doppler, color Doppler, and power Doppler as needed of all accessible portions of each vessel. Bilateral testing is considered an integral part of a complete examination. Limited examinations for reoccurring indications may be performed as noted. The reflux portion of the exam is performed with the patient in reverse Trendelenburg.  +-----+---------------+---------+-----------+----------+--------------+ RIGHTCompressibilityPhasicitySpontaneityPropertiesThrombus Aging +-----+---------------+---------+-----------+----------+--------------+ CFV  Full           Yes      Yes                                  +-----+---------------+---------+-----------+----------+--------------+   +---------+---------------+---------+-----------+----------+-------------------+ LEFT     CompressibilityPhasicitySpontaneityPropertiesThrombus Aging      +---------+---------------+---------+-----------+----------+-------------------+ CFV      Full           Yes      Yes                                      +---------+---------------+---------+-----------+----------+-------------------+ SFJ      Full                                                             +---------+---------------+---------+-----------+----------+-------------------+ FV Prox  Full                                                             +---------+---------------+---------+-----------+----------+-------------------+  FV Mid   Full                                                             +---------+---------------+---------+-----------+----------+-------------------+ FV DistalFull                                                             +---------+---------------+---------+-----------+----------+-------------------+ PFV      Full                                                             +---------+---------------+---------+-----------+----------+-------------------+ POP      Full           Yes      Yes                                      +---------+---------------+---------+-----------+----------+-------------------+ PTV      Full                                                             +---------+---------------+---------+-----------+----------+-------------------+ PERO                                                  Not well visualized +---------+---------------+---------+-----------+----------+-------------------+    Summary: RIGHT: - No evidence of common femoral vein obstruction.  LEFT: - There is no evidence of deep vein thrombosis in the lower extremity. However, portions of this  examination were limited- see technologist comments above.  - No cystic structure found in the popliteal fossa.  *See table(s) above for measurements and observations. Electronically signed by Jamelle Haring on 02/10/2022 at 6:03:45 PM.    Final    DG Chest Portable 1 View  Result Date: 02/09/2022 CLINICAL DATA:  Provided history: Dyspnea. Additional history provided: Shortness of breath and cough for 2 days. Left leg swelling. Patient reports missing dialysis treatments. EXAM: PORTABLE CHEST 1 VIEW COMPARISON:  Prior chest radiographs 11/19/2021 and earlier. FINDINGS: Cardiomegaly with central pulmonary vascular congestion. Aortic atherosclerosis. Interstitial and basilar dependent ill-defined airspace opacities (right greater than left). No sizable pleural effusion or evidence of pneumothorax. No acute bony abnormality identified. IMPRESSION: Cardiomegaly with central pulmonary vascular congestion. Associated interstitial and basilar dependent ill-defined airspace opacities (right greater than left), likely reflecting pulmonary edema. Aortic Atherosclerosis (ICD10-I70.0). Electronically Signed   By: Kellie Simmering D.O.   On: 02/09/2022 10:31   Medications:   atorvastatin  20 mg Oral Daily   calcitRIOL  1.25 mcg Oral Q T,Th,Sa-HD   carvedilol  25 mg Oral BID   Chlorhexidine Gluconate Cloth  6 each Topical Q0600   Chlorhexidine Gluconate Cloth  6 each Topical Q0600   Chlorhexidine Gluconate Cloth  6 each Topical Q0600   cinacalcet  120 mg Oral Q T,Th,Sa-HD   darbepoetin (ARANESP) injection - DIALYSIS  60 mcg Intravenous Q Tue-HD   furosemide  80 mg Oral Daily   gabapentin  200 mg Oral QHS   heparin  5,000 Units Subcutaneous Q8H   insulin aspart  0-6 Units Subcutaneous TID WC   insulin glargine-yfgn  15 Units Subcutaneous QHS   multivitamin with minerals  1 tablet Oral Daily   polyethylene glycol  17 g Oral Daily   sucroferric oxyhydroxide  1,000 mg Oral TID WC    Dialysis Orders: [facility] 4h   400/800   112.5kg   2/2 bath  LUA AVF  Heparin 5700  - mircera 60 ug q2, last 5/18, overdue - iron sucrose, 50 mg q wk - calcitriol 1.25 mcg po tiw  - cinacalcet 120 mg po tiw q snack - last HD 6/06, came in 113.7 and left 113.0 - last Hb 7.7 on 6/06  Assessment/Plan: AHRF - due  to missed HD w/ vol overload, initial CXR w/ pulm edema. 3 L UF on 6/13 and 3L UF on 6/14. Well under EDW, will need to lower at discharge. Appears euvolemic and SOB is resolved.   ESRD - TTS HD. Missed 2-3 outpt HD recently. Had Extra HD today for vol / solute excess. Improved.  BP/ volume - BP's good, holding midodrine for now. Getting home coreg/ lasix here.  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 Transfuse if Hb <  7. Esa overdue, getting darbe 60 ug weekly on Tuesday here, 1st dose 6/13.  MBD ckd - CCa and phos in range. Cont po vdra, sensipar and binder.   Anice Paganini, PA-C 02/11/2022, 9:27 AM  [facility] Pager: [phone]

## 2022-02-11 NOTE — Progress Notes (Signed)
OT Cancellation Note  Patient Details Name: Mark [name] MRN: [id number] DOB: 10-19-1950   Cancelled Treatment:    Reason Eval/Treat Not Completed: Patient at procedure or test/ unavailable.  Pt currently in dialysis, will try to check back later in the day to proceed with OT eval.    Kerigan Narvaez OTR/L 02/11/2022, 11:24 AM

## 2022-02-11 NOTE — Progress Notes (Signed)
PT Cancellation Note  Patient Details Name: Mark [name] MRN: [id number] DOB: May 14, 1951   Cancelled Treatment:    Reason Eval/Treat Not Completed: Patient at procedure or test/unavailable Off unit at HD. Will re-attempt as time and schedule allows.   Maanasa Aderhold A. Gilford Rile PT, DPT Acute Rehabilitation Services Office [phone]    Linna Hoff 02/11/2022, 9:18 AM

## 2022-02-11 NOTE — Progress Notes (Signed)
PROGRESS NOTE        PATIENT DETAILS Name: Mark [name] Age: 71 y.o. Sex: male Date of Birth: [birth year] Admit Date: 02/09/2022 Admitting Physician Evalee Mutton Kristeen Mans, MD WUX:[id number], Dalbert Batman, MD  Brief Summary: Patient is a 71 y.o.  male with a history of ESRD on HD TTS, PAD s/p BKA, chronic HFpEF, DM-2, HTN, seizure disorder, IgM MGUS, normocytic anemia who missed HD x2 and presented with volume overload/pulm edema-he was subsequently admitted to the hospitalist service.   Significant events: 6/14>> admit to [facility]-hypoxia-volume overload in the setting of missed HD  Significant studies: 6/13>> CXR: Pulmonary vascular congestion 6/14>>LLE Doppler: No DVT 6/15>> x-ray left knee: Severe tricompartmental OA of the knee. 6/15>> x-ray left hip: No acute fracture   Significant microbiology data:   Procedures:   Consults: Nephrology  Subjective: Continues to complain of pain in the left BKA stump going to his groin area.  Claims pain is different than his usual phantom pain.  Claims he is unable to ambulate due to pain.  Still has not yet ambulated with PT.   Objective: Vitals: Blood pressure (!) 153/67, pulse 74, temperature 98.6 F (37 C), temperature source Oral, resp. rate 15, weight 106.1 kg, SpO2 93 %.   Exam: Gen Exam:Alert awake-not in any distress HEENT:atraumatic, normocephalic Chest: B/L clear to auscultation anteriorly CVS:S1S2 regular Abdomen:soft non tender, non distended Extremities: Left BKA site without dehiscence/swelling/erythema.  Left thigh without any major findings on exam. Neurology: Non focal Skin: no rash   Pertinent Labs/Radiology:    Latest Ref Rng & Units 02/10/2022    2:34 AM 02/09/2022   10:10 AM 01/12/2022   10:23 AM  CBC  WBC 4.0 - 10.5 K/uL 6.5  7.2  7.6   Hemoglobin 13.0 - 17.0 g/dL 7.7  7.6  7.9   Hematocrit 39.0 - 52.0 % 24.1  24.2  23.6   Platelets 150 - 400 K/uL 442  420  346     Lab Results   Component Value Date   NA 138 02/10/2022   K 3.8 02/10/2022   CL 101 02/10/2022   CO2 26 02/10/2022      Assessment/Plan: Acute hypoxic respiratory failure due to volume overload-with acute on chronic HFpEF-in the setting of missed HD: Hypoxia resolved with HD.  Volume status now stable.   Hyperkalemia: Secondary to missed HD-resolved.     Right foot ulceration: Claims this was due to a new shoe-wound care following.   Normocytic anemia: Due to combination of IgM MGUS and ESRD.  Defer Aranesp/iron to nephrology service  Left stump to left groin pain: Unclear etiology-has known phantom pain but claims this pain is much more severe-preventing ambulation.  Dopplers negative for DVT-plain x-rays without any acute findings.  Suspect this may be neuropathic pain-already on Neurontin (dose adjusted for ESRD)-add Lidoderm patches.  I have asked Dr. Sharol Given to see evaluate and advise.   DM-2 (A1c 8.5 on 08/31/2021): CBG stable with Semglee 15 units daily and SSI.    Recent Labs    02/10/22 1650 02/10/22 2059 02/11/22 0820  GLUCAP 126* 146* 110*       Hypertension: BP stable-continue Coreg-has history of orthostatic hypotension-allow some amount of resting hypertension.   History of orthostatic hypotension: Apparently also takes midodrine-allow some amount of resting hypertension.  HLD: Continue statin   PAD s/p left BKA: See  above regarding neuropathic pain    Debility/deconditioning: Claims he has had difficulty ambulating-due to severe left leg stump pain.  See above-await PT/OT eval  Obesity: Estimated body mass index is 30.03 kg/m as calculated from the following:   Height as of 12/23/21: '6\' 2"'$  (1.88 m).   Weight as of this encounter: 106.1 kg.   Code status:   Code Status: Full Code   DVT Prophylaxis: heparin injection 5,000 Units Start: 02/09/22 1400   Family Communication: None at bedside  Disposition Plan: Status is: Observation The patient will require care  spanning > 2 midnights and should be moved to inpatient because: Improving hypoxia due to volume overload-titrating of oxygen-getting second HD today-severe LLE pain-unable to ambulate-awaiting PT/OT eval.  Not stable for discharge today-May require SNF depending on his mobility with PT/OT.   Planned Discharge Destination:Home health versus SNF   Diet: Diet Order             Diet renal with fluid restriction Fluid restriction: 1200 mL Fluid; Room service appropriate? Yes; Fluid consistency: Thin  Diet effective now                     Antimicrobial agents: Anti-infectives (From admission, onward)    None        MEDICATIONS: Scheduled Meds:  atorvastatin  20 mg Oral Daily   calcitRIOL  1.25 mcg Oral Q T,Th,Sa-HD   carvedilol  25 mg Oral BID   Chlorhexidine Gluconate Cloth  6 each Topical Q0600   Chlorhexidine Gluconate Cloth  6 each Topical Q0600   Chlorhexidine Gluconate Cloth  6 each Topical Q0600   cinacalcet  120 mg Oral Q T,Th,Sa-HD   darbepoetin (ARANESP) injection - DIALYSIS  60 mcg Intravenous Q Tue-HD   furosemide  80 mg Oral Daily   gabapentin  200 mg Oral QHS   heparin  5,000 Units Subcutaneous Q8H   insulin aspart  0-6 Units Subcutaneous TID WC   insulin glargine-yfgn  15 Units Subcutaneous QHS   multivitamin with minerals  1 tablet Oral Daily   polyethylene glycol  17 g Oral Daily   sucroferric oxyhydroxide  1,000 mg Oral TID WC   Continuous Infusions: PRN Meds:.acetaminophen **OR** acetaminophen, albuterol, heparin, HYDROmorphone (DILAUDID) injection, ondansetron **OR** ondansetron (ZOFRAN) IV, oxyCODONE   I have personally reviewed following labs and imaging studies  LABORATORY DATA: CBC: Recent Labs  Lab 02/09/22 1010 02/10/22 0234  WBC 7.2 6.5  NEUTROABS 4.0  --   HGB 7.6* 7.7*  HCT 24.2* 24.1*  MCV 97.6 94.5  PLT 420* 442*     Basic Metabolic Panel: Recent Labs  Lab 02/09/22 1010 02/09/22 2128 02/10/22 0234  NA 143  --  138   K 5.2*  --  3.8  CL 109  --  101  CO2 17*  --  26  GLUCOSE 127*  --  94  BUN 109*  --  52*  CREATININE 19.63*  --  11.05*  CALCIUM 9.4  --  8.9  PHOS  --  4.1  --      GFR: Estimated Creatinine Clearance: 8.1 mL/min (A) (by C-G formula based on SCr of 11.05 mg/dL (H)).  Liver Function Tests: Recent Labs  Lab 02/09/22 1010  AST 6*  ALT 8  ALKPHOS 55  BILITOT 0.7  PROT 7.0  ALBUMIN 3.1*    No results for input(s): "LIPASE", "AMYLASE" in the last 168 hours. No results for input(s): "AMMONIA" in the last 168 hours.  Coagulation  Profile: No results for input(s): "INR", "PROTIME" in the last 168 hours.  Cardiac Enzymes: No results for input(s): "CKTOTAL", "CKMB", "CKMBINDEX", "TROPONINI" in the last 168 hours.  BNP (last 3 results) No results for input(s): "PROBNP" in the last 8760 hours.  Lipid Profile: No results for input(s): "CHOL", "HDL", "LDLCALC", "TRIG", "CHOLHDL", "LDLDIRECT" in the last 72 hours.  Thyroid Function Tests: No results for input(s): "TSH", "T4TOTAL", "FREET4", "T3FREE", "THYROIDAB" in the last 72 hours.  Anemia Panel: No results for input(s): "VITAMINB12", "FOLATE", "FERRITIN", "TIBC", "IRON", "RETICCTPCT" in the last 72 hours.  Urine analysis:    Component Value Date/Time   COLORURINE STRAW (A) 08/31/2021 1007   APPEARANCEUR CLEAR 08/31/2021 1007   LABSPEC 1.009 08/31/2021 1007   PHURINE 8.0 08/31/2021 1007   GLUCOSEU >=500 (A) 08/31/2021 1007   HGBUR SMALL (A) 08/31/2021 1007   BILIRUBINUR NEGATIVE 08/31/2021 1007   KETONESUR 5 (A) 08/31/2021 1007   PROTEINUR 100 (A) 08/31/2021 1007   UROBILINOGEN 0.2 04/18/2015 1544   NITRITE NEGATIVE 08/31/2021 1007   LEUKOCYTESUR NEGATIVE 08/31/2021 1007    Sepsis Labs: Lactic Acid, Venous    Component Value Date/Time   LATICACIDVEN 0.80 08/31/2017 1325    MICROBIOLOGY: No results found for this or any previous visit (from the past 240 hour(s)).  RADIOLOGY STUDIES/RESULTS: DG HIP  UNILAT WITH PELVIS 2-3 VIEWS LEFT  Result Date: 02/11/2022 CLINICAL DATA:  Pain. EXAM: DG HIP (WITH OR WITHOUT PELVIS) 2-3V LEFT COMPARISON:  Bone survey 01/04/2022 FINDINGS: There is diffuse decreased bone mineralization. Partial visualization of right cephalomedullary nail fixation of the proximal right femur with chronic lesser trochanter presumably posttraumatic cortical abnormality. Mild bilateral femoroacetabular joint space narrowing. Mild bilateral sacroiliac subchondral sclerosis degenerative changes. No definite acute fracture. Vascular phleboliths overlie the pelvis. Moderate arterial vascular calcifications. IMPRESSION: 1. Within the limitations of diffuse decreased bone mineralization, no acute fracture is seen. 2. Partial visualization of right cephalomedullary nail ORIF of the proximal right femur. Electronically Signed   By: Yvonne Kendall M.D.   On: 02/11/2022 08:23   DG Knee Complete 4 Views Left  Result Date: 02/11/2022 CLINICAL DATA:  Pain. EXAM: LEFT KNEE - COMPLETE 4+ VIEW COMPARISON:  Bone survey 01/04/2022. FINDINGS: There is diffuse decreased bone mineralization. Postsurgical changes are again seen of left below-the-knee amputation the level of the proximal tibial and fibular diaphyses. The distal surgical bone margin appears sharp without erosion. Moderate to severe medial and moderate lateral compartment joint space narrowing. Severe patellofemoral joint space narrowing with bone-on-bone contact and moderate superior greater than inferior patellar degenerative osteophytes. No acute fracture is seen. No dislocation. No significant joint effusion. Vascular calcifications are noted. IMPRESSION: 1. Status post below-the-knee amputation without cortical erosion seen to indicate radiographic evidence of acute osteomyelitis. 2. 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 Electronically Signed   By: Yvonne Kendall M.D.   On: 02/11/2022 08:13   VAS [place] LOWER EXTREMITY  VENOUS (DVT)  Result Date: 02/10/2022  Lower Venous DVT Study Patient Name:  Mark [name]  Date of Exam:   02/10/2022 Medical Rec #: [id number]         Accession #:    [id number] Date of Birth: 06/06/1951          Patient Gender: M Patient Age:   45 years Exam Location:  [facility] Procedure:      VAS [place] LOWER EXTREMITY VENOUS (DVT) Referring Phys: Oren Binet --------------------------------------------------------------------------------  Indications: Pain.  Risk Factors: None identified. Limitations: Left BKA.  Comparison Study: No prior studies. Performing Technologist: Oliver Hum RVT  Examination Guidelines: A complete evaluation includes B-mode imaging, spectral Doppler, color Doppler, and power Doppler as needed of all accessible portions of each vessel. Bilateral testing is considered an integral part of a complete examination. Limited examinations for reoccurring indications may be performed as noted. The reflux portion of the exam is performed with the patient in reverse Trendelenburg.  +-----+---------------+---------+-----------+----------+--------------+ RIGHTCompressibilityPhasicitySpontaneityPropertiesThrombus Aging +-----+---------------+---------+-----------+----------+--------------+ CFV  Full           Yes      Yes                                 +-----+---------------+---------+-----------+----------+--------------+   +---------+---------------+---------+-----------+----------+-------------------+ LEFT     CompressibilityPhasicitySpontaneityPropertiesThrombus Aging      +---------+---------------+---------+-----------+----------+-------------------+ CFV      Full           Yes      Yes                                      +---------+---------------+---------+-----------+----------+-------------------+ SFJ      Full                                                              +---------+---------------+---------+-----------+----------+-------------------+ FV Prox  Full                                                             +---------+---------------+---------+-----------+----------+-------------------+ FV Mid   Full                                                             +---------+---------------+---------+-----------+----------+-------------------+ FV DistalFull                                                             +---------+---------------+---------+-----------+----------+-------------------+ PFV      Full                                                             +---------+---------------+---------+-----------+----------+-------------------+ POP      Full           Yes      Yes                                      +---------+---------------+---------+-----------+----------+-------------------+ PTV  Full                                                             +---------+---------------+---------+-----------+----------+-------------------+ PERO                                                  Not well visualized +---------+---------------+---------+-----------+----------+-------------------+    Summary: RIGHT: - No evidence of common femoral vein obstruction.  LEFT: - There is no evidence of deep vein thrombosis in the lower extremity. However, portions of this examination were limited- see technologist comments above.  - No cystic structure found in the popliteal fossa.  *See table(s) above for measurements and observations. Electronically signed by Jamelle Haring on 02/10/2022 at 6:03:45 PM.    Final      LOS: 1 day   Oren Binet, MD  [facility]    To contact the attending provider between 7A-7P or the covering provider during after hours 7P-7A, please log into the web site [email or web address] and access using universal [place] password for that web site. If you do not have the password,  please call the hospital operator.  02/11/2022, 12:11 PM

## 2022-02-12 ENCOUNTER — Inpatient Hospital Stay (HOSPITAL_COMMUNITY): Payer: Medicare Other

## 2022-02-12 ENCOUNTER — Ambulatory Visit: Payer: Medicare Other | Admitting: Internal Medicine

## 2022-02-12 DIAGNOSIS — M79605 Pain in left leg: Secondary | ICD-10-CM | POA: Diagnosis not present

## 2022-02-12 DIAGNOSIS — Z89512 Acquired absence of left leg below knee: Secondary | ICD-10-CM | POA: Diagnosis not present

## 2022-02-12 DIAGNOSIS — E8770 Fluid overload, unspecified: Secondary | ICD-10-CM | POA: Diagnosis not present

## 2022-02-12 LAB — GLUCOSE, CAPILLARY
Glucose-Capillary: 129 mg/dL — ABNORMAL HIGH (ref 70–99)
Glucose-Capillary: 126 mg/dL — ABNORMAL HIGH (ref 70–99)
Glucose-Capillary: 133 mg/dL — ABNORMAL HIGH (ref 70–99)
Glucose-Capillary: 116 mg/dL — ABNORMAL HIGH (ref 70–99)

## 2022-02-12 MED ORDER — PROSOURCE PLUS PO LIQD
30.0000 mL | Freq: Two times a day (BID) | ORAL | Status: DC
  Administered 2022-02-12 – 2022-02-13 (×3): 30 mL via ORAL
  Filled 2022-02-12 (×4): qty 30

## 2022-02-12 NOTE — Progress Notes (Signed)
[name] Progress Note   Subjective:  Seen in room. No CP/dyspnea, on room air and looks comfortable. Says dialysis went fine yesterday - net UF 0 unfortunately, looks like was hypotensive the entire time.  Objective Vitals:   02/11/22 1625 02/11/22 2140 02/12/22 0517 02/12/22 0942  BP: (!) 105/53 (!) 142/72 123/60 118/60  Pulse: 72 66 72 66  Resp: '18 18 18 17  '$ Temp: 98 F (36.7 C) 98.1 F (36.7 C) 98.4 F (36.9 C)   TempSrc:  Oral    SpO2: 100% 93% 94% 92%  Weight:       Physical Exam General: Well appearing man, NAD. Room air Heart: RRR; no murmur Lungs: Bibasilar rales; clear in upper lobes Abdomen: soft Extremities: L BKA; no RLE edema Dialysis Access: LUE AVF + bruit  Additional Objective Labs: Basic Metabolic Panel: Recent Labs  Lab 02/09/22 1010 02/09/22 2128 02/10/22 0234  NA 143  --  138  K 5.2*  --  3.8  CL 109  --  101  CO2 17*  --  26  GLUCOSE 127*  --  94  BUN 109*  --  52*  CREATININE 19.63*  --  11.05*  CALCIUM 9.4  --  8.9  PHOS  --  4.1  --    Liver Function Tests: Recent Labs  Lab 02/09/22 1010  AST 6*  ALT 8  ALKPHOS 55  BILITOT 0.7  PROT 7.0  ALBUMIN 3.1*   CBC: Recent Labs  Lab 02/09/22 1010 02/10/22 0234  WBC 7.2 6.5  NEUTROABS 4.0  --   HGB 7.6* 7.7*  HCT 24.2* 24.1*  MCV 97.6 94.5  PLT 420* 442*   Studies/Results: DG HIP UNILAT WITH PELVIS 2-3 VIEWS LEFT  Result Date: 02/11/2022 CLINICAL DATA:  Pain. EXAM: DG HIP (WITH OR WITHOUT PELVIS) 2-3V LEFT COMPARISON:  Bone survey 01/04/2022 FINDINGS: There is diffuse decreased bone mineralization. Partial visualization of right cephalomedullary nail fixation of the proximal right femur with chronic lesser trochanter presumably posttraumatic cortical abnormality. Mild bilateral femoroacetabular joint space narrowing. Mild bilateral sacroiliac subchondral sclerosis degenerative changes. No definite acute fracture. Vascular phleboliths overlie the pelvis. Moderate  arterial vascular calcifications. IMPRESSION: 1. Within the limitations of diffuse decreased bone mineralization, no acute fracture is seen. 2. Partial visualization of right cephalomedullary nail ORIF of the proximal right femur. Electronically Signed   By: Yvonne Kendall M.D.   On: 02/11/2022 08:23   DG Knee Complete 4 Views Left  Result Date: 02/11/2022 CLINICAL DATA:  Pain. EXAM: LEFT KNEE - COMPLETE 4+ VIEW COMPARISON:  Bone survey 01/04/2022. FINDINGS: There is diffuse decreased bone mineralization. Postsurgical changes are again seen of left below-the-knee amputation the level of the proximal tibial and fibular diaphyses. The distal surgical bone margin appears sharp without erosion. Moderate to severe medial and moderate lateral compartment joint space narrowing. Severe patellofemoral joint space narrowing with bone-on-bone contact and moderate superior greater than inferior patellar degenerative osteophytes. No acute fracture is seen. No dislocation. No significant joint effusion. Vascular calcifications are noted. IMPRESSION: 1. Status post below-the-knee amputation without cortical erosion seen to indicate radiographic evidence of acute osteomyelitis. 2. 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 Electronically Signed   By: Yvonne Kendall M.D.   On: 02/11/2022 08:13   VAS [place] LOWER EXTREMITY VENOUS (DVT)  Result Date: 02/10/2022  Lower Venous DVT Study Patient Name:  Mark [name]  Date of Exam:   02/10/2022 Medical Rec #: [id number]  Accession #:    [id number] Date of Birth: [birth year]          Patient Gender: M Patient Age:   71 years Exam Location:  [facility] Procedure:      VAS [place] LOWER EXTREMITY VENOUS (DVT) Referring Phys: Oren Binet --------------------------------------------------------------------------------  Indications: Pain.  Risk Factors: None identified. Limitations: Left BKA. Comparison Study: No prior studies. Performing Technologist:  Oliver Hum RVT  Examination Guidelines: A complete evaluation includes B-mode imaging, spectral Doppler, color Doppler, and power Doppler as needed of all accessible portions of each vessel. Bilateral testing is considered an integral part of a complete examination. Limited examinations for reoccurring indications may be performed as noted. The reflux portion of the exam is performed with the patient in reverse Trendelenburg.  +-----+---------------+---------+-----------+----------+--------------+ RIGHTCompressibilityPhasicitySpontaneityPropertiesThrombus Aging +-----+---------------+---------+-----------+----------+--------------+ CFV  Full           Yes      Yes                                 +-----+---------------+---------+-----------+----------+--------------+   +---------+---------------+---------+-----------+----------+-------------------+ LEFT     CompressibilityPhasicitySpontaneityPropertiesThrombus Aging      +---------+---------------+---------+-----------+----------+-------------------+ CFV      Full           Yes      Yes                                      +---------+---------------+---------+-----------+----------+-------------------+ SFJ      Full                                                             +---------+---------------+---------+-----------+----------+-------------------+ FV Prox  Full                                                             +---------+---------------+---------+-----------+----------+-------------------+ FV Mid   Full                                                             +---------+---------------+---------+-----------+----------+-------------------+ FV DistalFull                                                             +---------+---------------+---------+-----------+----------+-------------------+ PFV      Full                                                              +---------+---------------+---------+-----------+----------+-------------------+  POP      Full           Yes      Yes                                      +---------+---------------+---------+-----------+----------+-------------------+ PTV      Full                                                             +---------+---------------+---------+-----------+----------+-------------------+ PERO                                                  Not well visualized +---------+---------------+---------+-----------+----------+-------------------+    Summary: RIGHT: - No evidence of common femoral vein obstruction.  LEFT: - There is no evidence of deep vein thrombosis in the lower extremity. However, portions of this examination were limited- see technologist comments above.  - No cystic structure found in the popliteal fossa.  *See table(s) above for measurements and observations. Electronically signed by Jamelle Haring on 02/10/2022 at 6:03:45 PM.    Final     Medications:   atorvastatin  20 mg Oral Daily   calcitRIOL  1.25 mcg Oral Q T,Th,Sa-HD   carvedilol  25 mg Oral BID   Chlorhexidine Gluconate Cloth  6 each Topical Q0600   Chlorhexidine Gluconate Cloth  6 each Topical Q0600   Chlorhexidine Gluconate Cloth  6 each Topical Q0600   cinacalcet  120 mg Oral Q T,Th,Sa-HD   darbepoetin (ARANESP) injection - DIALYSIS  60 mcg Intravenous Q Tue-HD   furosemide  80 mg Oral Daily   gabapentin  200 mg Oral QHS   heparin  5,000 Units Subcutaneous Q8H   insulin aspart  0-6 Units Subcutaneous TID WC   insulin glargine-yfgn  15 Units Subcutaneous QHS   lidocaine  1 patch Transdermal Daily   multivitamin with minerals  1 tablet Oral Daily   polyethylene glycol  17 g Oral Daily   sucroferric oxyhydroxide  1,000 mg Oral TID WC    Dialysis Orders: [facility]h  400/800   112.5kg   2/2 bath  LUA AVF  Heparin 5700  - mircera 60 ug q2, last 5/18, overdue - iron sucrose, 50 mg q wk - calcitriol  1.25 mcg po tiw  - cinacalcet 120 mg po tiw q snack - last HD 6/06, came in 113.7 and left 113.0 - last Hb 7.7 on 6/06  Assessment/Plan: 1. Dyspnea/acute respiratory failure: Initial CXR with pulm edema. S/p HD 6/13, 6/14. Now well below prior EDW. No UF with last HD d/t hypotension, follow. Will repeat CXR. 2. ESRD: Continue HD on TTS schedule - next tomorrow (6/17). 3. HTN/volume: BP controlled, on home Coreg/Lasix here. 4. Anemia: Hgb 7.7 - ESA was overdue, Aranesp 45mg given on 02/09/22. 5. Secondary hyperparathyroidism: Ca/Phos ok, continue home Velphoro + sensipar + VDRA. 6. Nutrition: Alb low, adding supplement. 7. PAD s/p L BKA 8. R foot wound 9: L knee and hip pain: PT evaluation underway  KVeneta Penton PA-C 02/12/2022, 10:02 AM  [facility]

## 2022-02-12 NOTE — TOC Initial Note (Signed)
Transition of Care [facility]) - Initial/Assessment Note    Patient Details  Name: Mark [name] MRN: [id number] Date of Birth: 1951-06-14  Transition [facility]) CM/SW Contact:    Tom-Johnson, Renea Ee, RN Phone Number: 02/12/2022, 2:42 PM  Clinical Narrative:                    CM spoke with patient at bedside about needs for post hospital transition. Admitted for Volume Overload.  From home with wife. Has one supportive daughter.  Has a Lt BKA and uses prosthesis.Has all necessary DME's at home. PCP is Wynetta Emery, Dalbert Batman, MD and uses [facility] on [address]. Home health referral sent to [facility] per patient's request. Caryl Pina voiced acceptance. Info on AVS.  Wife to transport at discharge. CM will continue to follow with needs.    Patient Goals and CMS Choice        Expected Discharge Plan and Services                                                Prior Living Arrangements/Services                       Activities of Daily Living      Permission Sought/Granted                  Emotional Assessment              Admission diagnosis:  Volume overload [E87.70] Dyspnea, unspecified type [R06.00] Patient Active Problem List   Diagnosis Date Noted   Left leg pain    Volume overload 02/09/2022   Leg wound, right 02/09/2022   C. difficile colitis 11/20/2021   COVID-19 virus infection 11/19/2021   Fluid overload 11/19/2021   Dyslipidemia 11/19/2021   Abrasion, right lower leg, initial encounter 11/03/2021   History of left below knee amputation ([place]) 35/57/3220   Metabolic bone disease 25/42/7062   Pneumonia of left lower lobe due to infectious organism 08/31/2021   Acute back pain 08/31/2021   Mixed diabetic hyperlipidemia associated with type 2 diabetes mellitus ([place]) 08/31/2021   PAD (peripheral artery disease) ([place]) 02/03/2021   ESRD on hemodialysis ([place]) 11/26/2020   Glaucoma suspect 04/30/2020   NPDR  (nonproliferative diabetic retinopathy) ([place]) 03/18/2020   Anemia due to stage 5 chronic kidney disease, not on chronic dialysis ([place]) 06/22/2019   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   Hip fracture ([place]) 01/01/2017   Impingement syndrome of right shoulder 10/25/2016   Incisional hernia 07/14/2016   IDA (iron deficiency anemia) 03/08/2016   Seizure disorder ([place]) 01/01/2016   History of Clostridium difficile colitis 01/01/2016   Positive for microalbuminuria 08/18/2015   CKD (chronic kidney disease) stage 3, GFR 30-59 ml/min (HCC) 08/18/2015   GERD (gastroesophageal reflux disease) 04/30/2015   Tinea pedis 04/30/2015   Onychomycosis of toenail 04/30/2015   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-stage renal disease) ([place]) 04/16/2015   Chronic diastolic CHF (congestive heart failure) ([place]) 10/18/2014   Essential hypertension 04/08/2014   Anemia of chronic disease 10/01/2013   PCP:  [name], MD Pharmacy:   [facility], [address] [place] [address] Phone: [phone] Fax: [phone]     Social Determinants of Health (  SDOH) Interventions    Readmission Risk Interventions    02/12/2022    2:40 PM 09/02/2021   12:59 PM  Readmission Risk Prevention Plan  Transportation Screening Complete Complete  Medication Review (RN Care Manager) Referral to Pharmacy Complete  PCP or Specialist appointment within 3-5 days of discharge Complete Complete  HRI or Home Care Consult Complete Complete  SW Recovery Care/Counseling Consult Complete Patient refused  Palliative Care Screening Not Applicable Not [place] Not Applicable Not Applicable

## 2022-02-12 NOTE — Progress Notes (Signed)
Pt not stable for d/c today. Pt due for HD tomorrow. Contacted [facility] and spoke to RN who reports that pt should be able to receive out-pt HD treatment at regular time if pt stable for d/c in the am. Pt would need to arrive at 10:30 for 10:55 chair time. Clinic requests that a staff member contact clinic if pt will be coming to out-pt appt so clinic can be prepared. Update provided to attending, renal PA, and RN CM. Contacted inpt HD unit to request that pt be placed on 2nd shift in the event pt stable for d/c in the morning and can receive treatment out-pt.   Melven Sartorius Renal Navigator [phone]

## 2022-02-12 NOTE — Progress Notes (Signed)
Physical Therapy Treatment Patient Details Name: Mark [name] MRN: [id number] DOB: 09/21/1950 Today's Date: 02/12/2022   History of Present Illness 71 y/o male presented to ED on 02/09/22 for SOB, cough, and L LE swelling. CXR showed bilateral pulmonary edema. Admitted for acute hypoxic respiratory failure due to volume overload. PMH: ESRD on HD, PAD s/p L BKA, chronic HFpEF, T2DM, HTN, seizures, anemia    PT Comments    Patient progressing towards physical therapy goals. Continues to require increased time to don prosthesis due to L LE swelling but able to eventually don. Patient ambulated in room with RW and min guard. Patient complaining of feeling "drunk" from pain meds and felt like "floor was moving around me" so deferred further mobility. Patient motivated to return home once ready. Patient would benefit from manual w/c as patient's power w/c is currently with limited use due to worn wheels, size and inability to travel with it.      Recommendations for follow up therapy are one component of a multi-disciplinary discharge planning process, led by the attending physician.  Recommendations may be updated based on patient status, additional functional criteria and insurance authorization.  Follow Up Recommendations  Home health PT     Assistance Recommended at Discharge Frequent or constant Supervision/Assistance  Patient can return home with the following A little help with walking and/or transfers;A little help with bathing/dressing/bathroom;Assistance with cooking/housework;Assist for transportation;Help with stairs or ramp for entrance   Equipment Recommendations  Wheelchair (measurements PT);Wheelchair cushion (measurements PT)    Recommendations for Other Services       Precautions / Restrictions Precautions Precautions: Fall Precaution Comments: L BKA with prosthesis Restrictions Weight Bearing Restrictions: No     Mobility  Bed Mobility               General  bed mobility comments: up in recliner on arrival. Left sitting on EOB    Transfers Overall transfer level: Needs assistance Equipment used: Rolling Dantae Meunier (2 wheels) Transfers: Sit to/from Stand Sit to Stand: Min assist           General transfer comment: minA from low recliner surface x 3    Ambulation/Gait Ambulation/Gait assistance: Min guard Gait Distance (Feet): 20 Feet Assistive device: Rolling Juwan Vences (2 wheels) Gait Pattern/deviations: Step-to pattern, Decreased stride length, Knee flexed in stance - left Gait velocity: decreased     General Gait Details: min guard for safety. Keeping L knee flexed in stance due to height difference with shoe on L and no shoe on R. Patient reports feeling "drunk" from pain medicine and felt like "floor is moving around me" so returned to bed   Stairs             Wheelchair Mobility    Modified Rankin (Stroke Patients Only)       Balance Overall balance assessment: Needs assistance Sitting-balance support: No upper extremity supported Sitting balance-Leahy Scale: Fair     Standing balance support: Bilateral upper extremity supported, Reliant on assistive device for balance Standing balance-Leahy Scale: Poor                              Cognition Arousal/Alertness: Awake/alert Behavior During Therapy: WFL for tasks assessed/performed Overall Cognitive Status: No family/caregiver present to determine baseline cognitive functioning  Exercises      General Comments        Pertinent Vitals/Pain Pain Assessment Pain Assessment: No/denies pain    Home Living                          Prior Function            PT Goals (current goals can now be found in the care plan section) Acute Rehab PT Goals Patient Stated Goal: to get stronger PT Goal Formulation: With patient Time For Goal Achievement: 02/25/22 Potential to Achieve  Goals: Good Progress towards PT goals: Progressing toward goals    Frequency    Min 3X/week      PT Plan Current plan remains appropriate    Co-evaluation              AM-PAC PT "6 Clicks" Mobility   Outcome Measure  Help needed turning from your back to your side while in a flat bed without using bedrails?: A Little Help needed moving from lying on your back to sitting on the side of a flat bed without using bedrails?: A Little Help needed moving to and from a bed to a chair (including a wheelchair)?: A Little Help needed standing up from a chair using your arms (e.g., wheelchair or bedside chair)?: A Little Help needed to walk in hospital room?: A Little Help needed climbing 3-5 steps with a railing? : Total 6 Click Score: 16    End of Session Equipment Utilized During Treatment: Gait belt Activity Tolerance: Patient tolerated treatment well Patient left: in bed;with call bell/phone within reach (sitting EOB) Nurse Communication: Mobility status PT Visit Diagnosis: Unsteadiness on feet (R26.81);Muscle weakness (generalized) (M62.81)     Time: 5638-7564 PT Time Calculation (min) (ACUTE ONLY): 30 min  Charges:  $Gait Training: 8-22 mins $Therapeutic Activity: 8-22 mins                     Areyanna Figeroa A. Gilford Rile PT, DPT Acute Rehabilitation Services Office [phone]    Linna Hoff 02/12/2022, 4:54 PM

## 2022-02-12 NOTE — Progress Notes (Signed)
PROGRESS NOTE        PATIENT DETAILS Name: Mark [name] Age: 71 y.o. Sex: male Date of Birth: October 28, 1950 Admit Date: 02/09/2022 Admitting Physician Evalee Mutton Kristeen Mans, MD TZG:YFVCBSW, Dalbert Batman, MD  Brief Summary: Patient is a 71 y.o.  male with a history of ESRD on HD TTS, PAD s/p BKA, chronic HFpEF, DM-2, HTN, seizure disorder, IgM MGUS, normocytic anemia who missed HD x2 and presented with volume overload/pulm edema-he was subsequently admitted to the hospitalist service.   Significant events: 6/14>> admit to TRH-hypoxia-volume overload in the setting of missed HD  Significant studies: 6/13>> CXR: Pulmonary vascular congestion 6/14>>LLE Doppler: No DVT 6/15>> x-ray left knee: Severe tricompartmental OA of the knee. 6/15>> x-ray left hip: No acute fracture   Significant microbiology data:   Procedures:   Consults: Nephrology  Subjective:  Patient in chair, appears comfortable, denies any headache, no fever, no chest pain or pressure, no shortness of breath , no abdominal pain. No new focal weakness.  Says he feels sleepy for the last few days since he has been in the hospital, he says one of the medications might be making him sleepy but does not know which,    Objective: Vitals: Blood pressure 118/60, pulse 66, temperature 98.9 F (37.2 C), temperature source Oral, resp. rate 17, weight 109.2 kg, SpO2 92 %.   Exam:  Awake but feels sleepy, Alert, No new F.N deficits, Normal affect [place].AT,PERRAL Supple Neck, No JVD,   Symmetrical Chest wall movement, Good air movement bilaterally, CTAB RRR,No Gallops, Rubs or new Murmurs,  +ve B.Sounds, Abd Soft, No tenderness,    Left BKA site without dehiscence/swelling/erythema.     Assessment/Plan:  Acute hypoxic respiratory failure due to volume overload-with acute on chronic HFpEF-in the setting of missed HD: Hypoxia resolved with HD.  Volume status now stable.   Hyperkalemia: Secondary to  missed HD-resolved.     Right foot ulceration: Claims this was due to a new shoe-wound care following.   Left BKA stump to left groin pain: Unclear etiology-has known phantom pain but claims this pain is much more severe-preventing ambulation.  Dopplers negative for DVT-plain x-rays without any acute findings.  Dr. Sharol Given to evaluate, Lidoderm patch on board, due to his drowsiness have cut down on his Neurontin and narcotics.   PAD s/p left BKA: See above regarding neuropathic pain   ESRD.  On TTS schedule nephrology following.    Normocytic anemia: Due to combination of IgM MGUS and ESRD.  Defer Aranesp/iron to nephrology service  Hypertension: BP stable-continue Coreg-has history of orthostatic hypotension-allow some amount of resting hypertension.   History of orthostatic hypotension: Apparently also takes midodrine-allow some amount of resting hypertension.  HLD: Continue statin   Debility/deconditioning: Claims he has had difficulty ambulating-due to severe left leg stump pain.  See above-await PT/OT eval  DM-2 (A1c 8.5 on 08/31/2021): CBG stable with Semglee 15 units daily and SSI.    Recent Labs    02/11/22 2140 02/12/22 0757 02/12/22 1157  GLUCAP 142* 129* 126*      Obesity: Estimated body mass index is 30.91 kg/m as calculated from the following:   Height as of 12/23/21: '6\' 2"'$  (1.88 m).   Weight as of this encounter: 109.2 kg.   Code status:   Code Status: Full Code   DVT Prophylaxis: heparin injection 5,000 Units Start: 02/09/22  1400   Family Communication: None at bedside  Disposition Plan: Status is: Observation The patient will require care spanning > 2 midnights and should be moved to inpatient because: Improving hypoxia due to volume overload-titrating of oxygen-getting second HD today-severe LLE pain-unable to ambulate-awaiting PT/OT eval.  Not stable for discharge today-May require SNF depending on his mobility with PT/OT.   Planned Discharge  Destination:Home health versus SNF   Diet: Diet Order             Diet renal with fluid restriction Fluid restriction: 1200 mL Fluid; Room service appropriate? Yes; Fluid consistency: Thin  Diet effective now                     Antimicrobial agents: Anti-infectives (From admission, onward)    None        MEDICATIONS: Scheduled Meds:  (feeding supplement) PROSource Plus  30 mL Oral BID BM   atorvastatin  20 mg Oral Daily   calcitRIOL  1.25 mcg Oral Q T,Th,Sa-HD   carvedilol  25 mg Oral BID   Chlorhexidine Gluconate Cloth  6 each Topical Q0600   Chlorhexidine Gluconate Cloth  6 each Topical Q0600   Chlorhexidine Gluconate Cloth  6 each Topical Q0600   cinacalcet  120 mg Oral Q T,Th,Sa-HD   darbepoetin (ARANESP) injection - DIALYSIS  60 mcg Intravenous Q Tue-HD   furosemide  80 mg Oral Daily   heparin  5,000 Units Subcutaneous Q8H   insulin aspart  0-6 Units Subcutaneous TID WC   insulin glargine-yfgn  15 Units Subcutaneous QHS   lidocaine  1 patch Transdermal Daily   multivitamin with minerals  1 tablet Oral Daily   polyethylene glycol  17 g Oral Daily   sucroferric oxyhydroxide  1,000 mg Oral TID WC   Continuous Infusions: PRN Meds:.acetaminophen **OR** acetaminophen, albuterol, ondansetron **OR** ondansetron (ZOFRAN) IV   I have personally reviewed following labs and imaging studies  LABORATORY DATA:  Recent Labs  Lab 02/09/22 1010 02/10/22 0234  WBC 7.2 6.5  HGB 7.6* 7.7*  HCT 24.2* 24.1*  PLT 420* 442*  MCV 97.6 94.5  MCH 30.6 30.2  MCHC 31.4 32.0  RDW 16.9* 16.6*  LYMPHSABS 2.2  --   MONOABS 0.7  --   EOSABS 0.3  --   BASOSABS 0.1  --     Recent Labs  Lab 02/09/22 1010 02/09/22 2128 02/10/22 0234  NA 143  --  138  K 5.2*  --  3.8  CL 109  --  101  CO2 17*  --  26  GLUCOSE 127*  --  94  BUN 109*  --  52*  CREATININE 19.63*  --  11.05*  CALCIUM 9.4  --  8.9  AST 6*  --   --   ALT 8  --   --   ALKPHOS 55  --   --   BILITOT 0.7   --   --   ALBUMIN 3.1*  --   --   PHOS  --  4.1  --   BNP 1,682.2*  --   --     RADIOLOGY STUDIES/RESULTS: DG CHEST PORT 1 VIEW  Result Date: 02/12/2022 CLINICAL DATA:  Pulmonary edema. EXAM: PORTABLE CHEST 1 VIEW COMPARISON:  Radiographs 02/09/2022 and 11/19/2021.  CT 10/16/2014. FINDINGS: 1026 hours. Progressively lower lung volumes. Stable cardiomegaly and aortic atherosclerosis. There is persistent vascular congestion with mild pulmonary edema and probable small bilateral pleural effusions. No evidence of pneumothorax. The bones  appear unchanged. Telemetry leads overlie the chest. IMPRESSION: Progressively lower lung volumes with evidence of persistent pulmonary edema. Electronically Signed   By: Richardean Sale M.D.   On: 02/12/2022 10:48   DG HIP UNILAT WITH PELVIS 2-3 VIEWS LEFT  Result Date: 02/11/2022 CLINICAL DATA:  Pain. EXAM: DG HIP (WITH OR WITHOUT PELVIS) 2-3V LEFT COMPARISON:  Bone survey 01/04/2022 FINDINGS: There is diffuse decreased bone mineralization. Partial visualization of right cephalomedullary nail fixation of the proximal right femur with chronic lesser trochanter presumably posttraumatic cortical abnormality. Mild bilateral femoroacetabular joint space narrowing. Mild bilateral sacroiliac subchondral sclerosis degenerative changes. No definite acute fracture. Vascular phleboliths overlie the pelvis. Moderate arterial vascular calcifications. IMPRESSION: 1. Within the limitations of diffuse decreased bone mineralization, no acute fracture is seen. 2. Partial visualization of right cephalomedullary nail ORIF of the proximal right femur. Electronically Signed   By: Yvonne Kendall M.D.   On: 02/11/2022 08:23   DG Knee Complete 4 Views Left  Result Date: 02/11/2022 CLINICAL DATA:  Pain. EXAM: LEFT KNEE - COMPLETE 4+ VIEW COMPARISON:  Bone survey 01/04/2022. FINDINGS: There is diffuse decreased bone mineralization. Postsurgical changes are again seen of left below-the-knee  amputation the level of the proximal tibial and fibular diaphyses. The distal surgical bone margin appears sharp without erosion. Moderate to severe medial and moderate lateral compartment joint space narrowing. Severe patellofemoral joint space narrowing with bone-on-bone contact and moderate superior greater than inferior patellar degenerative osteophytes. No acute fracture is seen. No dislocation. No significant joint effusion. Vascular calcifications are noted. IMPRESSION: 1. Status post below-the-knee amputation without cortical erosion seen to indicate radiographic evidence of acute osteomyelitis. 2. 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 Electronically Signed   By: Yvonne Kendall M.D.   On: 02/11/2022 08:13   VAS [place] LOWER EXTREMITY VENOUS (DVT)  Result Date: 02/10/2022  Lower Venous DVT Study Patient Name:  Mark [name]  Date of Exam:   02/10/2022 Medical Rec #: [id number]         Accession #:    [id number] Date of Birth: 09-16-1950          Patient Gender: M Patient Age:   53 years Exam Location:  [facility] Procedure:      VAS [place] LOWER EXTREMITY VENOUS (DVT) Referring Phys: Oren Binet --------------------------------------------------------------------------------  Indications: Pain.  Risk Factors: None identified. Limitations: Left BKA. Comparison Study: No prior studies. Performing Technologist: Oliver Hum RVT  Examination Guidelines: A complete evaluation includes B-mode imaging, spectral Doppler, color Doppler, and power Doppler as needed of all accessible portions of each vessel. Bilateral testing is considered an integral part of a complete examination. Limited examinations for reoccurring indications may be performed as noted. The reflux portion of the exam is performed with the patient in reverse Trendelenburg.  +-----+---------------+---------+-----------+----------+--------------+ RIGHTCompressibilityPhasicitySpontaneityPropertiesThrombus Aging  +-----+---------------+---------+-----------+----------+--------------+ CFV  Full           Yes      Yes                                 +-----+---------------+---------+-----------+----------+--------------+   +---------+---------------+---------+-----------+----------+-------------------+ LEFT     CompressibilityPhasicitySpontaneityPropertiesThrombus Aging      +---------+---------------+---------+-----------+----------+-------------------+ CFV      Full           Yes      Yes                                      +---------+---------------+---------+-----------+----------+-------------------+  SFJ      Full                                                             +---------+---------------+---------+-----------+----------+-------------------+ FV Prox  Full                                                             +---------+---------------+---------+-----------+----------+-------------------+ FV Mid   Full                                                             +---------+---------------+---------+-----------+----------+-------------------+ FV DistalFull                                                             +---------+---------------+---------+-----------+----------+-------------------+ PFV      Full                                                             +---------+---------------+---------+-----------+----------+-------------------+ POP      Full           Yes      Yes                                      +---------+---------------+---------+-----------+----------+-------------------+ PTV      Full                                                             +---------+---------------+---------+-----------+----------+-------------------+ PERO                                                  Not well visualized +---------+---------------+---------+-----------+----------+-------------------+    Summary: RIGHT: - No  evidence of common femoral vein obstruction.  LEFT: - There is no evidence of deep vein thrombosis in the lower extremity. However, portions of this examination were limited- see technologist comments above.  - No cystic structure found in the popliteal fossa.  *See table(s) above for measurements and observations. Electronically signed by Jamelle Haring on 02/10/2022 at 6:03:45 PM.    Final      LOS: 2 days   Signature  Lala Lund  M.D on 02/12/2022 at 12:51 PM   -  To page go to [email or web address]

## 2022-02-12 NOTE — Plan of Care (Signed)
  Problem: Metabolic: Goal: Ability to maintain appropriate glucose levels will improve Outcome: Progressing   Problem: Nutritional: Goal: Maintenance of adequate nutrition will improve Outcome: Progressing   Problem: Clinical Measurements: Goal: Respiratory complications will improve Outcome: Progressing Goal: Cardiovascular complication will be avoided Outcome: Progressing   Problem: Nutrition: Goal: Adequate nutrition will be maintained Outcome: Progressing   Problem: Pain Managment: Goal: General experience of comfort will improve Outcome: Progressing
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
## 2022-02-13 DIAGNOSIS — I5032 Chronic diastolic (congestive) heart failure: Secondary | ICD-10-CM | POA: Diagnosis not present

## 2022-02-13 LAB — CBC
HCT: 25.8 % — ABNORMAL LOW (ref 39.0–52.0)
Hemoglobin: 8.3 g/dL — ABNORMAL LOW (ref 13.0–17.0)
MCH: 30.7 pg (ref 26.0–34.0)
MCHC: 32.2 g/dL (ref 30.0–36.0)
MCV: 95.6 fL (ref 80.0–100.0)
Platelets: 440 10*3/uL — ABNORMAL HIGH (ref 150–400)
RBC: 2.7 MIL/uL — ABNORMAL LOW (ref 4.22–5.81)
RDW: 16.6 % — ABNORMAL HIGH (ref 11.5–15.5)
WBC: 8 10*3/uL (ref 4.0–10.5)
nRBC: 0 % (ref 0.0–0.2)

## 2022-02-13 LAB — RENAL FUNCTION PANEL
Albumin: 2.7 g/dL — ABNORMAL LOW (ref 3.5–5.0)
Anion gap: 12 (ref 5–15)
BUN: 44 mg/dL — ABNORMAL HIGH (ref 8–23)
CO2: 28 mmol/L (ref 22–32)
Calcium: 8.4 mg/dL — ABNORMAL LOW (ref 8.9–10.3)
Chloride: 95 mmol/L — ABNORMAL LOW (ref 98–111)
Creatinine, Ser: 8.26 mg/dL — ABNORMAL HIGH (ref 0.61–1.24)
GFR, Estimated: 6 mL/min — ABNORMAL LOW (ref >60)
Glucose, Bld: 119 mg/dL — ABNORMAL HIGH (ref 70–99)
Phosphorus: 4.6 mg/dL (ref 2.5–4.6)
Potassium: 3.8 mmol/L (ref 3.5–5.1)
Sodium: 135 mmol/L (ref 135–145)

## 2022-02-13 LAB — GLUCOSE, CAPILLARY
Glucose-Capillary: 116 mg/dL — ABNORMAL HIGH (ref 70–99)
Glucose-Capillary: 98 mg/dL (ref 70–99)
Glucose-Capillary: 139 mg/dL — ABNORMAL HIGH (ref 70–99)

## 2022-02-13 MED ORDER — HEPARIN SODIUM (PORCINE) 1000 UNIT/ML IJ SOLN: 4000.0000 [IU] | Freq: Once | INTRAMUSCULAR | Status: DC

## 2022-02-13 MED ORDER — HEPARIN SODIUM (PORCINE) 1000 UNIT/ML DIALYSIS
4000.0000 [IU] | Freq: Once | INTRAMUSCULAR | Status: AC
Start: 2022-02-13 — End: 2022-02-13
  Administered 2022-02-13: 4000 [IU] via INTRAVENOUS_CENTRAL
  Filled 2022-02-13: qty 4

## 2022-02-13 NOTE — Progress Notes (Signed)
[facility] Progress Note   Subjective:   Seen on HD - 2L UFG and tolerating. No CP/dyspnea. Plan for discharge today after dialysis.  Objective Vitals:   02/12/22 1746 02/12/22 2131 02/13/22 0505 02/13/22 0800  BP: (!) 124/95 (!) 158/68 119/63   Pulse: 71 80 76 86  Resp: '18 18 18 17  '$ Temp: 98.3 F (36.8 C) 98.3 F (36.8 C) 97.8 F (36.6 C) 98.1 F (36.7 C)  TempSrc: Oral Oral  Oral  SpO2: 95% 92% 93% 95%  Weight:       Physical Exam General: Well appearing man, NAD. Room air Heart: RRR; no murmur Lungs: CTA anteriorly Abdomen: soft Extremities: L BKA; no RLE edema Dialysis Access: LUE AVF + bruit  Additional Objective Labs: Basic Metabolic Panel: Recent Labs  Lab 02/09/22 1010 02/09/22 2128 02/10/22 0234  NA 143  --  138  K 5.2*  --  3.8  CL 109  --  101  CO2 17*  --  26  GLUCOSE 127*  --  94  BUN 109*  --  52*  CREATININE 19.63*  --  11.05*  CALCIUM 9.4  --  8.9  PHOS  --  4.1  --    Liver Function Tests: Recent Labs  Lab 02/09/22 1010  AST 6*  ALT 8  ALKPHOS 55  BILITOT 0.7  PROT 7.0  ALBUMIN 3.1*   CBC: Recent Labs  Lab 02/09/22 1010 02/10/22 0234  WBC 7.2 6.5  NEUTROABS 4.0  --   HGB 7.6* 7.7*  HCT 24.2* 24.1*  MCV 97.6 94.5  PLT 420* 442*   Studies/Results: DG CHEST PORT 1 VIEW  Result Date: 02/12/2022 CLINICAL DATA:  Pulmonary edema. EXAM: PORTABLE CHEST 1 VIEW COMPARISON:  Radiographs 02/09/2022 and 11/19/2021.  CT 10/16/2014. FINDINGS: 1026 hours. Progressively lower lung volumes. Stable cardiomegaly and aortic atherosclerosis. There is persistent vascular congestion with mild pulmonary edema and probable small bilateral pleural effusions. No evidence of pneumothorax. The bones appear unchanged. Telemetry leads overlie the chest. IMPRESSION: Progressively lower lung volumes with evidence of persistent pulmonary edema. Electronically Signed   By: Richardean Sale M.D.   On: 02/12/2022 10:48   Medications:   (feeding  supplement) PROSource Plus  30 mL Oral BID BM   atorvastatin  20 mg Oral Daily   calcitRIOL  1.25 mcg Oral Q T,Th,Sa-HD   carvedilol  25 mg Oral BID   Chlorhexidine Gluconate Cloth  6 each Topical Q0600   Chlorhexidine Gluconate Cloth  6 each Topical Q0600   Chlorhexidine Gluconate Cloth  6 each Topical Q0600   cinacalcet  120 mg Oral Q T,Th,Sa-HD   darbepoetin (ARANESP) injection - DIALYSIS  60 mcg Intravenous Q Tue-HD   furosemide  80 mg Oral Daily   heparin  5,000 Units Subcutaneous Q8H   insulin aspart  0-6 Units Subcutaneous TID WC   insulin glargine-yfgn  15 Units Subcutaneous QHS   lidocaine  1 patch Transdermal Daily   multivitamin with minerals  1 tablet Oral Daily   polyethylene glycol  17 g Oral Daily   sucroferric oxyhydroxide  1,000 mg Oral TID WC    Dialysis Orders: [facility]  400/800   112.5kg   2/2 bath  LUA AVF  Heparin 5700  - mircera 60 ug q2, last 5/18, overdue - iron sucrose, 50 mg q wk - calcitriol 1.25 mcg po tiw  - cinacalcet 120 mg po tiw q snack - last HD 6/06, came in 113.7 and left 113.0 -  last Hb 7.7 on 6/06   Assessment/Plan: 1. Dyspnea/acute respiratory failure: Initial CXR with pulm edema. S/p HD 6/13, 6/14. Now well below prior EDW. No UF with last HD d/t hypotension, follow. CXR with persistent pulm edema, although looks better -> 2L UFG today. 2. ESRD: Continue HD on TTS schedule - HD today. 3. HTN/volume: BP controlled, on home Coreg/Lasix here. 4. Anemia: Hgb 7.7 - ESA was overdue, Aranesp 35mg given on 02/09/22. 5. Secondary hyperparathyroidism: Ca/Phos ok, continue home Velphoro + sensipar + VDRA. 6. Nutrition: Alb low, continue supplement. 7. PAD s/p L BKA 8. R foot wound 9: L knee and hip pain: Poorly fitting prosthesis, ortho involved.  KVeneta Penton PA-C 02/13/2022, 8:16 AM  [employer]

## 2022-02-13 NOTE — Plan of Care (Signed)
  Problem: Education: Goal: Ability to describe self-care measures that may prevent or decrease complications (Diabetes Survival Skills Education) will improve Outcome: Adequate for Discharge Goal: Individualized Educational Video(s) Outcome: Adequate for Discharge   Problem: Coping: Goal: Ability to adjust to condition or change in health will improve Outcome: Adequate for Discharge   Problem: Fluid Volume: Goal: Ability to maintain a balanced intake and output will improve Outcome: Adequate for Discharge   Problem: Health Behavior/Discharge Planning: Goal: Ability to identify and utilize available resources and services will improve Outcome: Adequate for Discharge Goal: Ability to manage health-related needs will improve Outcome: Adequate for Discharge   Problem: Metabolic: Goal: Ability to maintain appropriate glucose levels will improve Outcome: Adequate for Discharge   Problem: Nutritional: Goal: Maintenance of adequate nutrition will improve Outcome: Adequate for Discharge Goal: Progress toward achieving an optimal weight will improve Outcome: Adequate for Discharge   Problem: Skin Integrity: Goal: Risk for impaired skin integrity will decrease Outcome: Adequate for Discharge   Problem: Tissue Perfusion: Goal: Adequacy of tissue perfusion will improve Outcome: Adequate for Discharge   Problem: Education: Goal: Knowledge of General Education information will improve Description: Including pain rating scale, medication(s)/side effects and non-pharmacologic comfort measures Outcome: Adequate for Discharge   Problem: Health Behavior/Discharge Planning: Goal: Ability to manage health-related needs will improve Outcome: Adequate for Discharge   Problem: Clinical Measurements: Goal: Ability to maintain clinical measurements within normal limits will improve Outcome: Adequate for Discharge Goal: Will remain free from infection Outcome: Adequate for Discharge Goal:  Diagnostic test results will improve Outcome: Adequate for Discharge Goal: Respiratory complications will improve Outcome: Adequate for Discharge Goal: Cardiovascular complication will be avoided Outcome: Adequate for Discharge   Problem: Activity: Goal: Risk for activity intolerance will decrease Outcome: Adequate for Discharge   Problem: Nutrition: Goal: Adequate nutrition will be maintained Outcome: Adequate for Discharge   Problem: Coping: Goal: Level of anxiety will decrease Outcome: Adequate for Discharge   Problem: Elimination: Goal: Will not experience complications related to bowel motility Outcome: Adequate for Discharge Goal: Will not experience complications related to urinary retention Outcome: Adequate for Discharge   Problem: Pain Managment: Goal: General experience of comfort will improve Outcome: Adequate for Discharge   Problem: Safety: Goal: Ability to remain free from injury will improve Outcome: Adequate for Discharge   Problem: Skin Integrity: Goal: Risk for impaired skin integrity will decrease Outcome: Adequate for Discharge   Problem: Acute Rehab OT Goals (only OT should resolve) Goal: Pt. Will Perform Lower Body Bathing Outcome: Adequate for Discharge Goal: Pt. Will Perform Lower Body Dressing Outcome: Adequate for Discharge Goal: Pt. Will Transfer To Toilet Outcome: Adequate for Discharge Goal: Pt. Will Perform Toileting-Clothing Manipulation Outcome: Adequate for Discharge Goal: Pt. Will Perform Tub/Shower Transfer Outcome: Adequate for Discharge Goal: Pt/Caregiver Will Perform Home Exercise Program Outcome: Adequate for Discharge   Problem: Acute Rehab PT Goals(only PT should resolve) Goal: Pt Will Go Supine/Side To Sit Outcome: Adequate for Discharge Goal: Patient Will Transfer Sit To/From Stand Outcome: Adequate for Discharge Goal: Pt Will Transfer Bed To Chair/Chair To Bed Outcome: Adequate for Discharge Goal: Pt Will  Ambulate Outcome: Adequate for Discharge Goal: Pt/caregiver will Perform Home Exercise Program Outcome: Adequate for Discharge

## 2022-02-13 NOTE — TOC Transition Note (Signed)
Transition of Care [facility]) - CM/SW Discharge Note   Patient Details  Name: Mark [name] MRN: [id number] Date of Birth: 26-May-1951  [facility]) CM/SW Contact:  Bartholomew Crews, RN Phone Number: [phone] 02/13/2022, 12:49 PM   Clinical Narrative:     Patient to transition home today. Patient returned call to [facility]. He stated that he is unable to use private vehicles for transportation. [facility] stated that patient is inactive and cannot do verifications on the weekend. PTAR arranged for 2pm. Medical transport paperwork completed. Liaison, Corene Cornea, at [facility] notified of discharge. Previous RNCM arranged HH for PT and OT with [facility]. Patient stated that he does not have a manual wheelchair - referral to [facility] for delivery to home. Message left for wife of transition home today. No further TOC needs identified.   Final next level of care: [place] Barriers to Discharge: No Barriers Identified   Patient Goals and CMS Choice Patient states their goals for this hospitalization and ongoing recovery are:: return home CMS Medicare.gov Compare Post Acute Care list provided to:: Patient Choice offered to / list presented to : Patient  Discharge Placement                       Discharge Plan and Services   Discharge Planning Services: CM Consult Post Acute Care Choice: NA          DME Arranged: Wheelchair manual DME Agency: [facility] Date DME Agency Contacted: 02/13/22 Time DME Agency Contacted: [id number] Representative spoke with at DME Agency: Rafael Hernandez: PT, OT [place] Agency: [place] ([place]) Date [place]: 02/13/22 Time [place]: [place] Representative spoke with at [place]: [place] ([place]) Interventions     Readmission Risk Interventions    02/12/2022    2:40 PM 09/02/2021   12:59 PM  Readmission Risk Prevention Plan  Transportation Screening Complete Complete   Medication Review Press photographer) Referral to Pharmacy Complete  PCP or Specialist appointment within 3-5 days of discharge Complete Complete  HRI or Home Care Consult Complete Complete  SW Recovery Care/Counseling Consult Complete Patient refused  Palliative Care Screening Not Applicable Not [place] Not Applicable Not Applicable

## 2022-02-13 NOTE — TOC Progression Note (Addendum)
Transition of Care [facility]) - Progression Note    Patient Details  Name: Mark [name] MRN: [id number] Date of Birth: July 20, 1951  Transition of Care [facility]) CM/SW Contact  Bartholomew Crews, RN Phone Number: [phone] 02/13/2022, 8:30 AM  Clinical Narrative:     Unable to reach patient on hospital room phone or cell phone. SMS message sent to cell phone with [facility] contact information. Received call back from patient's spouse. Discussed anticipated DC post HD - spouse advised that patient had told her he would DC on Sunday. She is able to provide transportation home at DC if this is what patient wants. Home address and patient cell number verified. Patient does have manual wheelchair at home. Will continue efforts to contact patient and discuss transition home.   Update 11:10 - Received call back from patient. Discussed need for ambulance transport home. He stated that he is unable to ride in private vehicle and typically uses [facility] for wheelchair transportation. Advised that medical transport will be arranged when he returns to his room.   Expected Discharge Plan: [place] Barriers to Discharge: Continued Medical Work up  Expected Discharge Plan and Services Expected Discharge Plan: [place]   Discharge Planning Services: CM Consult Post Acute Care Choice: NA Living arrangements for the past 2 months: Single Family Home Expected Discharge Date: 02/13/22               DME Arranged: N/A DME Agency: NA       HH Arranged: PT, OT HH Agency: [place] ([facility]) Date HH Agency Contacted: 02/12/22 Time [place]: 0920 Representative spoke with at [place]: [place] ([place]) Interventions    Readmission Risk Interventions    02/12/2022    2:40 PM 09/02/2021   12:59 PM  Readmission Risk Prevention Plan  Transportation Screening Complete Complete  Medication Review (RN Care Manager) Referral to  Pharmacy Complete  PCP or Specialist appointment within 3-5 days of discharge Complete Complete  HRI or Home Care Consult Complete Complete  SW Recovery Care/Counseling Consult Complete Patient refused  Palliative Care Screening Not Applicable Not [place] Not Applicable Not Applicable

## 2022-02-13 NOTE — Progress Notes (Signed)
    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  A cane, crutch, or walker will not resolve  issue with performing activities of daily living. A wheelchair will allow patient to safely perform daily activities. Patient is not able to propel themselves in the home using a standard weight wheelchair due to arm weakness, endurance, and general weakness. Patient can self propel in the lightweight wheelchair. Length of need 12 months . Accessories: elevating leg rests (ELRs), wheel locks, extensions and anti-tippers.   02/13/22 [id number]

## 2022-02-13 NOTE — Progress Notes (Signed)
DISCHARGE NOTE HOME HARRIET BOLLEN to be discharged Home per MD order. Discussed prescriptions and follow up appointments with the patient. Prescriptions given to patient; medication list explained in detail. Patient verbalized understanding.  Skin clean, dry and intact without evidence of skin break down, no evidence of skin tears noted. IV catheter discontinued intact. Site without signs and symptoms of complications. Dressing and pressure applied. Pt denies pain at the site currently. No complaints noted.  Patient free of lines, drains, and wounds.   An After Visit Summary (AVS) was printed and given to the patient. Patient escorted via wheelchair, and discharged home via private auto.  Berneta Levins, RN

## 2022-02-13 NOTE — Discharge Summary (Signed)
[name] YFV:[id number] DOB: [birth year] DOA: 02/09/2022  PCP: [name], MD  Admit date: 02/09/2022  Discharge date: 02/13/2022  Admitted From: Home   Disposition:  Home   Recommendations for Outpatient Follow-up:   Follow up with PCP in 1-2 weeks  PCP Please obtain BMP/CBC, 2 view CXR in 1week,  (see Discharge instructions)   PCP Please follow up on the following pending results:    Home Health: PT, OT, RN if he qualifies Equipment/Devices: Wheelchair if he qualifies Consultations: Urology, Dr. Sharol Given Discharge Condition: Stable    CODE STATUS: Full    Diet Recommendation: Renal diet with 1200 cc fluid restriction per day.  Chief Complaint  Patient presents with   Shortness of Breath     Brief history of present illness from the day of admission and additional interim summary    71 y.o.  male with a history of ESRD on HD TTS, PAD s/p BKA, chronic HFpEF, DM-2, HTN, seizure disorder, IgM MGUS, normocytic anemia who missed HD x2 and presented with volume overload/pulm edema-he was subsequently admitted to the hospitalist service.     Significant events: 6/14>> admit to [facility]-hypoxia-volume overload in the setting of missed HD   Significant studies: 6/13>> CXR: Pulmonary vascular congestion 6/14>>LLE Doppler: No DVT 6/15>> x-ray left knee: Severe tricompartmental OA of the knee. 6/15>> x-ray left hip: No acute fracture                                                                 Hospital Course   Acute hypoxic respiratory failure due to volume overload-with acute on chronic HFpEF-in the setting of missed HD: Hypoxia resolved with HD.  Volume status now stable.    Right foot ulceration: Claims this was due to a new shoe-wound care following.  Home RN if he qualifies, requested to  follow-up with Dr. Sharol Given within a week postdischarge.  Seen and cleared by Dr. Sharol Given   Left BKA stump to left groin pain: Phantom pain, stable per Dr. Sharol Given.  Supportive care.  He became extremely sedated with Neurontin and narcotics which have been discontinued.  Stable on Tylenol.   PAD s/p left BKA: See above regarding neuropathic pain    ESRD.  On TTS schedule nephrology following.  Normocytic anemia: Due to combination of IgM MGUS and ESRD.  Defer Aranesp/iron to nephrology service   Hypertension: BP stable-continue home regimen.   History of orthostatic hypotension: Apparently also takes midodrine-allow some amount of resting hypertension.  HLD: Continue statin   Debility/deconditioning: Claims he has had difficulty ambulating-due to severe left leg stump pain.  See above-await PT/OT eval   DM-2 (A1c 8.5 on 08/31/2021): CBG stable here, continue home regimen upon discharge, follow-up with PCP for monitoring and adjustment.   Discharge diagnosis     Principal Problem:   Volume overload Active Problems:   Mixed diabetic hyperlipidemia associated with type 2 diabetes mellitus (HCC)   Essential hypertension   Chronic diastolic CHF (congestive heart failure) (HCC)   Monoclonal gammopathy of unknown significance (MGUS)   ESRD on hemodialysis (HCC)   PAD (peripheral artery disease) (HCC)   History of left below knee amputation ([place])   Leg wound, right   Left leg pain    Discharge instructions    Discharge Instructions     Discharge instructions   Complete by: As directed    Follow with Primary MD [name], MD and your orthopedic surgeon in 7 days   Get CBC, BMP  -  checked next visit within 1 week by Primary MD    Activity: As tolerated with Full fall precautions use walker/cane & assistance as needed  Disposition Home   Diet: Renal diet with 1200 cc of fluid restriction per day, Check your CBGs q. ACH S.   Special Instructions: If you have smoked or  chewed Tobacco  in the last 2 yrs please stop smoking, stop any regular Alcohol  and or any Recreational drug use.  On your next visit with your primary care physician please Get Medicines reviewed and adjusted.  Please request your Prim.MD to go over all Hospital Tests and Procedure/Radiological results at the follow up, please get all Hospital records sent to your Prim MD by signing hospital release before you go home.  If you experience worsening of your admission symptoms, develop shortness of breath, life threatening emergency, suicidal or homicidal thoughts you must seek medical attention immediately by calling 911 or calling your MD immediately  if symptoms less severe.  You Must read complete instructions/literature along with all the possible adverse reactions/side effects for all the Medicines you take and that have been prescribed to you. Take any new Medicines after you have completely understood and accpet all the possible adverse reactions/side effects.   Discharge wound care:   Complete by: As directed    1. Clean right foot and ankle wounds with saline, pat dry 2. Apply xeroform to the right ankle wound, top with foam dressing 3. Apply hydrogel  to the dorsal foot wound, top with dry dressing 4. Secure all with kerlix and ACE wrap with NO tension, just to hold in place.        Increase activity slowly   Complete by: As directed        Discharge Medications   Allergies as of 02/13/2022   No Known Allergies      Medication List     STOP taking these medications    mupirocin ointment 2 % Commonly known as: BACTROBAN   ondansetron 4 MG disintegrating tablet Commonly known as: ZOFRAN-ODT       TAKE these medications    acetaminophen 650 MG CR tablet Commonly known as: TYLENOL Take 1 tablet (650 mg total) by mouth 2 (two) times daily as  needed for pain.   atorvastatin 20 MG tablet Commonly known as: LIPITOR Take 1 tablet (20 mg total) by mouth daily.    carvedilol 25 MG tablet Commonly known as: COREG Take 1 tablet (25 mg total) by mouth 2 (two) times daily.   furosemide 80 MG tablet Commonly known as: LASIX Take 1 tablet (80 mg total) by mouth daily.   INSULIN SYRINGE .5CC/30GX5/16" 30G X 5/16" 0.5 ML Misc Check blood sugar TID & QHS   lanthanum 1000 MG chewable tablet Commonly known as: FOSRENOL Chew 1,000 mg by mouth daily.   Lantus SoloStar 100 UNIT/ML Solostar Pen Generic drug: insulin glargine Inject 20 Units into the skin at bedtime.   midodrine 5 MG tablet Commonly known as: PROAMATINE Take 5 mg by mouth in the morning and at bedtime. Do not takes in bp is lower 130/80   multivitamin with minerals Tabs tablet Take 1 tablet by mouth daily.   OneTouch Delica Lancets 32G Misc Use as directed to test blood sugar three times daily.   OneTouch Verio test strip Generic drug: glucose blood USE AS DIRECTED THREE TIMES DAILY TO  TEST  BLOOD  SUGAR   OneTouch Verio w/Device Kit Use as directed to test blood sugar three times daily.   TRUEplus 5-Bevel Pen Needles 32G X 4 MM Misc Generic drug: Insulin Pen Needle Use to administer Lantus once daily.   VITAMIN D PO Take 1 capsule by mouth daily.               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  A cane, crutch, or walker will not resolve  issue with performing activities of daily living. A wheelchair will allow patient to safely perform daily activities. Patient is not able to propel themselves in the home using a standard weight wheelchair due to arm weakness, endurance, and general weakness. Patient can self propel in the lightweight wheelchair. Length of  need 12 months . Accessories: elevating leg rests (ELRs), wheel locks, extensions and anti-tippers.   02/13/22 0556              Discharge Care Instructions  (From admission, onward)           Start     Ordered   02/13/22 0000  Discharge wound care:       Comments: 1. Clean right foot and ankle wounds with saline, pat dry 2. Apply xeroform to the right ankle wound, top with foam dressing 3. Apply hydrogel  to the dorsal foot wound, top with dry dressing 4. Secure all with kerlix and ACE wrap with NO tension, just to hold in place.        02/13/22 0816             Follow-up Information     [facility] Follow up.   Why: Someone will call you toschedule first home visit. Contact information: [address]  [place], [place] [address]  [phone]        [name], MD. Schedule an appointment as soon as possible for a visit in 1 week(s).   Specialty: Internal Medicine Contact  information: [place] [address] [phone]                 Major procedures and Radiology Reports - PLEASE review detailed and final reports thoroughly  -       DG CHEST PORT 1 VIEW  Result Date: 02/12/2022 CLINICAL DATA:  Pulmonary edema. EXAM: PORTABLE CHEST 1 VIEW COMPARISON:  Radiographs 02/09/2022 and 11/19/2021.  CT 10/16/2014. FINDINGS: 1026 hours. Progressively lower lung volumes. Stable cardiomegaly and aortic atherosclerosis. There is persistent vascular congestion with mild pulmonary edema and probable small bilateral pleural effusions. No evidence of pneumothorax. The bones appear unchanged. Telemetry leads overlie the chest. IMPRESSION: Progressively lower lung volumes with evidence of persistent pulmonary edema. Electronically Signed   By: Richardean Sale M.D.   On: 02/12/2022 10:48   DG HIP UNILAT WITH PELVIS 2-3 VIEWS LEFT  Result Date: 02/11/2022 CLINICAL DATA:  Pain. EXAM: DG HIP (WITH OR WITHOUT PELVIS) 2-3V  LEFT COMPARISON:  Bone survey 01/04/2022 FINDINGS: There is diffuse decreased bone mineralization. Partial visualization of right cephalomedullary nail fixation of the proximal right femur with chronic lesser trochanter presumably posttraumatic cortical abnormality. Mild bilateral femoroacetabular joint space narrowing. Mild bilateral sacroiliac subchondral sclerosis degenerative changes. No definite acute fracture. Vascular phleboliths overlie the pelvis. Moderate arterial vascular calcifications. IMPRESSION: 1. Within the limitations of diffuse decreased bone mineralization, no acute fracture is seen. 2. Partial visualization of right cephalomedullary nail ORIF of the proximal right femur. Electronically Signed   By: Yvonne Kendall M.D.   On: 02/11/2022 08:23   DG Knee Complete 4 Views Left  Result Date: 02/11/2022 CLINICAL DATA:  Pain. EXAM: LEFT KNEE - COMPLETE 4+ VIEW COMPARISON:  Bone survey 01/04/2022. FINDINGS: There is diffuse decreased bone mineralization. Postsurgical changes are again seen of left below-the-knee amputation the level of the proximal tibial and fibular diaphyses. The distal surgical bone margin appears sharp without erosion. Moderate to severe medial and moderate lateral compartment joint space narrowing. Severe patellofemoral joint space narrowing with bone-on-bone contact and moderate superior greater than inferior patellar degenerative osteophytes. No acute fracture is seen. No dislocation. No significant joint effusion. Vascular calcifications are noted. IMPRESSION: 1. Status post below-the-knee amputation without cortical erosion seen to indicate radiographic evidence of acute osteomyelitis. 2. 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 Electronically Signed   By: Yvonne Kendall M.D.   On: 02/11/2022 08:13   VAS [place] LOWER EXTREMITY VENOUS (DVT)  Result Date: 02/10/2022  Lower Venous DVT Study Patient Name:  RAESHAWN VO  Date of Exam:   02/10/2022 Medical  Rec #: [id number]         Accession #:    [id number] Date of Birth: 09-Nov-1950          Patient Gender: M Patient Age:   41 years Exam Location:  [facility] Procedure:      VAS [place] LOWER EXTREMITY VENOUS (DVT) Referring Phys: Oren Binet --------------------------------------------------------------------------------  Indications: Pain.  Risk Factors: None identified. Limitations: Left BKA. Comparison Study: No prior studies. Performing Technologist: Oliver Hum RVT  Examination Guidelines: A complete evaluation includes B-mode imaging, spectral Doppler, color Doppler, and power Doppler as needed of all accessible portions of each vessel. Bilateral testing is considered an integral part of a complete examination. Limited examinations for reoccurring indications may be performed as noted. The reflux portion of the exam is performed with the patient in reverse Trendelenburg.  +-----+---------------+---------+-----------+----------+--------------+ RIGHTCompressibilityPhasicitySpontaneityPropertiesThrombus Aging +-----+---------------+---------+-----------+----------+--------------+ CFV  Full  Yes      Yes                                 +-----+---------------+---------+-----------+----------+--------------+   +---------+---------------+---------+-----------+----------+-------------------+ LEFT     CompressibilityPhasicitySpontaneityPropertiesThrombus Aging      +---------+---------------+---------+-----------+----------+-------------------+ CFV      Full           Yes      Yes                                      +---------+---------------+---------+-----------+----------+-------------------+ SFJ      Full                                                             +---------+---------------+---------+-----------+----------+-------------------+ FV Prox  Full                                                              +---------+---------------+---------+-----------+----------+-------------------+ FV Mid   Full                                                             +---------+---------------+---------+-----------+----------+-------------------+ FV DistalFull                                                             +---------+---------------+---------+-----------+----------+-------------------+ PFV      Full                                                             +---------+---------------+---------+-----------+----------+-------------------+ POP      Full           Yes      Yes                                      +---------+---------------+---------+-----------+----------+-------------------+ PTV      Full                                                             +---------+---------------+---------+-----------+----------+-------------------+ PERO  Not well visualized +---------+---------------+---------+-----------+----------+-------------------+    Summary: RIGHT: - No evidence of common femoral vein obstruction.  LEFT: - There is no evidence of deep vein thrombosis in the lower extremity. However, portions of this examination were limited- see technologist comments above.  - No cystic structure found in the popliteal fossa.  *See table(s) above for measurements and observations. Electronically signed by Jamelle Haring on 02/10/2022 at 6:03:45 PM.    Final    DG Chest Portable 1 View  Result Date: 02/09/2022 CLINICAL DATA:  Provided history: Dyspnea. Additional history provided: Shortness of breath and cough for 2 days. Left leg swelling. Patient reports missing dialysis treatments. EXAM: PORTABLE CHEST 1 VIEW COMPARISON:  Prior chest radiographs 11/19/2021 and earlier. FINDINGS: Cardiomegaly with central pulmonary vascular congestion. Aortic atherosclerosis. Interstitial and basilar dependent ill-defined airspace  opacities (right greater than left). No sizable pleural effusion or evidence of pneumothorax. No acute bony abnormality identified. IMPRESSION: Cardiomegaly with central pulmonary vascular congestion. Associated interstitial and basilar dependent ill-defined airspace opacities (right greater than left), likely reflecting pulmonary edema. Aortic Atherosclerosis (ICD10-I70.0). Electronically Signed   By: Kellie Simmering D.O.   On: 02/09/2022 10:31       Today   Subjective    [name] today has no headache,no chest abdominal pain,no new weakness tingling or numbness, feels much better wants to go home today.    Objective   Blood pressure 134/71, pulse 86, temperature 98.1 F (36.7 C), temperature source Oral, resp. rate 17, weight 109.2 kg, SpO2 95 %.   Intake/Output Summary (Last 24 hours) at 02/13/2022 0818 Last data filed at 02/13/2022 0200 Gross per 24 hour  Intake 0 ml  Output 0 ml  Net 0 ml    Exam  Awake Alert, No new F.N deficits,    [place].AT,PERRAL Supple Neck,   Symmetrical Chest wall movement, Good air movement bilaterally, CTAB RRR,No Gallops,   +ve B.Sounds, Abd Soft, Non tender,  Left BKA site without dehiscence/swelling/erythema.   Data Review   Recent Labs  Lab 02/09/22 1010 02/10/22 0234  WBC 7.2 6.5  HGB 7.6* 7.7*  HCT 24.2* 24.1*  PLT 420* 442*  MCV 97.6 94.5  MCH 30.6 30.2  MCHC 31.4 32.0  RDW 16.9* 16.6*  LYMPHSABS 2.2  --   MONOABS 0.7  --   EOSABS 0.3  --   BASOSABS 0.1  --     Recent Labs  Lab 02/09/22 1010 02/09/22 2128 02/10/22 0234  NA 143  --  138  K 5.2*  --  3.8  CL 109  --  101  CO2 17*  --  26  GLUCOSE 127*  --  94  BUN 109*  --  52*  CREATININE 19.63*  --  11.05*  CALCIUM 9.4  --  8.9  AST 6*  --   --   ALT 8  --   --   ALKPHOS 55  --   --   BILITOT 0.7  --   --   ALBUMIN 3.1*  --   --   PHOS  --  4.1  --   BNP 1,682.2*  --   --     Total Time in preparing paper work, data evaluation and todays exam - 66  minutes  Lala Lund M.D on 02/13/2022 at 8:18 AM  [facility]

## 2022-02-13 NOTE — Discharge Instructions (Addendum)
Follow with Primary MD [name], MD and your orthopedic surgeon in 7 days   Get CBC, BMP  -  checked next visit within 1 week by Primary MD    Activity: As tolerated with Full fall precautions use walker/cane & assistance as needed  Disposition Home   Diet: Renal diet with 1200 cc of fluid restriction per day.  Check your CBGs q. [place].  Special Instructions: If you have smoked or chewed Tobacco  in the last 2 yrs please stop smoking, stop any regular Alcohol  and or any Recreational drug use.  On your next visit with your primary care physician please Get Medicines reviewed and adjusted.  Please request your Prim.MD to go over all Hospital Tests and Procedure/Radiological results at the follow up, please get all Hospital records sent to your Prim MD by signing hospital release before you go home.  If you experience worsening of your admission symptoms, develop shortness of breath, life threatening emergency, suicidal or homicidal thoughts you must seek medical attention immediately by calling 911 or calling your MD immediately  if symptoms less severe.  You Must read complete instructions/literature along with all the possible adverse reactions/side effects for all the Medicines you take and that have been prescribed to you. Take any new Medicines after you have completely understood and accpet all the possible adverse reactions/side effects.

## 2022-02-15 ENCOUNTER — Telehealth: Payer: Self-pay

## 2022-02-15 DIAGNOSIS — L0390 Cellulitis, unspecified: Secondary | ICD-10-CM | POA: Diagnosis not present

## 2022-02-15 DIAGNOSIS — S81801A Unspecified open wound, right lower leg, initial encounter: Secondary | ICD-10-CM | POA: Diagnosis not present

## 2022-02-15 DIAGNOSIS — M79605 Pain in left leg: Secondary | ICD-10-CM | POA: Diagnosis not present

## 2022-02-15 DIAGNOSIS — M726 Necrotizing fasciitis: Secondary | ICD-10-CM | POA: Diagnosis not present

## 2022-02-15 DIAGNOSIS — A419 Sepsis, unspecified organism: Secondary | ICD-10-CM | POA: Diagnosis not present

## 2022-02-15 NOTE — Telephone Encounter (Signed)
Transition of care contact from inpatient facility  Date of discharge: 02/13/22 Date of contact: 02/15/22 Method: Phone Spoke to: Patient  Patient contacted to discuss transition of care from recent inpatient hospitalization. Patient was admitted to [name] from [facility]/13-17/2023.. with discharge diagnosis of acute hypoxic resp. Failure 2/2 vol overload  in seeting of missed hemodialysis .  Medication changes were reviewed and dw him need not to miss HD .   Patient will follow up with his/her outpatient HD unit on: 02/16/22  [name]

## 2022-02-15 NOTE — Telephone Encounter (Signed)
Transition Care Management Follow-up Telephone Call Date of discharge and from where: 02/13/2022, [facility] How have you been since you were released from the hospital? He said he is doing pretty good.  Any questions or concerns? No  Items Reviewed: Did the pt receive and understand the discharge instructions provided? Yes  Medications obtained and verified? Yes - he said he has all of his medications and did not have any questions about the med regime  Other? No  Any new allergies since your discharge? No  Dietary orders reviewed? Yes Do you have support at home? Yes   Home Care and Equipment/Supplies: Were home health services ordered? yes If so, what is the name of the agency? [facility]  Has the agency set up a time to come to the patient's home? They called him and he refused services, stating he didn't want them  Were any new equipment or medical supplies ordered?  Yes: Wheelchair What is the name of the medical supply agency? [employer]  Were you able to get the supplies/equipment? yes Do you have any questions related to the use of the equipment or supplies? No  He attends dialysis T/T/S at [facility]: (I = Independent and D = Dependent) ADLs: has wheelchair for mobility.  His wife assists him as needed.  He said he does his own wound care for right foot and he has all of the dressing supplies that he needs     Follow up appointments reviewed:  PCP Hospital f/u appt confirmed? Yes  Scheduled to see Dr Wynetta Emery  - 03/15/2022,  [facility] f/u appt confirmed? Yes - orthopedics- 02/17/2022.   Are transportation arrangements needed? No  If their condition worsens, is the pt aware to call PCP or go to the Emergency Dept.? Yes Was the patient provided with contact information for the PCP's office or ED? Yes Was to pt encouraged to call back with questions or concerns? Yes

## 2022-02-16 ENCOUNTER — Telehealth: Payer: Self-pay | Admitting: Internal Medicine

## 2022-02-16 DIAGNOSIS — D631 Anemia in chronic kidney disease: Secondary | ICD-10-CM | POA: Diagnosis not present

## 2022-02-16 DIAGNOSIS — D688 Other specified coagulation defects: Secondary | ICD-10-CM | POA: Diagnosis not present

## 2022-02-16 DIAGNOSIS — R0600 Dyspnea, unspecified: Secondary | ICD-10-CM | POA: Diagnosis not present

## 2022-02-16 DIAGNOSIS — N2581 Secondary hyperparathyroidism of renal origin: Secondary | ICD-10-CM | POA: Diagnosis not present

## 2022-02-16 DIAGNOSIS — D472 Monoclonal gammopathy: Secondary | ICD-10-CM | POA: Diagnosis not present

## 2022-02-16 DIAGNOSIS — N186 End stage renal disease: Secondary | ICD-10-CM | POA: Diagnosis not present

## 2022-02-16 DIAGNOSIS — Z992 Dependence on renal dialysis: Secondary | ICD-10-CM | POA: Diagnosis not present

## 2022-02-16 NOTE — Telephone Encounter (Signed)
Copied from [place] [phone]. Topic: General - Other >> Feb 15, 2022  3:58 PM Rudene Anda wrote: Reason for CRM: Home health aid stated the pt is refusing all home health orders, FYI

## 2022-02-17 ENCOUNTER — Ambulatory Visit: Payer: Medicare Other | Admitting: Family

## 2022-02-18 ENCOUNTER — Telehealth: Payer: Self-pay | Admitting: Internal Medicine

## 2022-02-18 DIAGNOSIS — N186 End stage renal disease: Secondary | ICD-10-CM | POA: Diagnosis not present

## 2022-02-18 DIAGNOSIS — Z992 Dependence on renal dialysis: Secondary | ICD-10-CM | POA: Diagnosis not present

## 2022-02-18 DIAGNOSIS — N2581 Secondary hyperparathyroidism of renal origin: Secondary | ICD-10-CM | POA: Diagnosis not present

## 2022-02-18 DIAGNOSIS — D509 Iron deficiency anemia, unspecified: Secondary | ICD-10-CM | POA: Diagnosis not present

## 2022-02-18 DIAGNOSIS — D472 Monoclonal gammopathy: Secondary | ICD-10-CM | POA: Diagnosis not present

## 2022-02-18 DIAGNOSIS — D688 Other specified coagulation defects: Secondary | ICD-10-CM | POA: Diagnosis not present

## 2022-02-18 NOTE — Telephone Encounter (Signed)
Copied from [place] [phone]. Topic: General - Inquiry >> Feb 18, 2022  2:10 PM Chapman Fitch wrote: Reason for CRM: [employer] called to see if the repair order for pts power chair was received / this was faxed on 6.17.23 to the office / please advise

## 2022-02-20 DIAGNOSIS — D472 Monoclonal gammopathy: Secondary | ICD-10-CM | POA: Diagnosis not present

## 2022-02-20 DIAGNOSIS — D688 Other specified coagulation defects: Secondary | ICD-10-CM | POA: Diagnosis not present

## 2022-02-20 DIAGNOSIS — Z992 Dependence on renal dialysis: Secondary | ICD-10-CM | POA: Diagnosis not present

## 2022-02-20 DIAGNOSIS — D509 Iron deficiency anemia, unspecified: Secondary | ICD-10-CM | POA: Diagnosis not present

## 2022-02-20 DIAGNOSIS — N186 End stage renal disease: Secondary | ICD-10-CM | POA: Diagnosis not present

## 2022-02-20 DIAGNOSIS — N2581 Secondary hyperparathyroidism of renal origin: Secondary | ICD-10-CM | POA: Diagnosis not present

## 2022-02-23 DIAGNOSIS — D509 Iron deficiency anemia, unspecified: Secondary | ICD-10-CM | POA: Diagnosis not present

## 2022-02-23 DIAGNOSIS — D688 Other specified coagulation defects: Secondary | ICD-10-CM | POA: Diagnosis not present

## 2022-02-23 DIAGNOSIS — D472 Monoclonal gammopathy: Secondary | ICD-10-CM | POA: Diagnosis not present

## 2022-02-23 DIAGNOSIS — Z992 Dependence on renal dialysis: Secondary | ICD-10-CM | POA: Diagnosis not present

## 2022-02-23 DIAGNOSIS — N2581 Secondary hyperparathyroidism of renal origin: Secondary | ICD-10-CM | POA: Diagnosis not present

## 2022-02-23 DIAGNOSIS — N186 End stage renal disease: Secondary | ICD-10-CM | POA: Diagnosis not present

## 2022-02-23 NOTE — Telephone Encounter (Signed)
Angel from [facility] [employer] [facility] called for status update, she says the sooner this is received the sooner they can complete the repairs.

## 2022-02-24 DIAGNOSIS — E1351 Other specified diabetes mellitus with diabetic peripheral angiopathy without gangrene: Secondary | ICD-10-CM | POA: Diagnosis not present

## 2022-02-25 DIAGNOSIS — N186 End stage renal disease: Secondary | ICD-10-CM | POA: Diagnosis not present

## 2022-02-25 DIAGNOSIS — N2581 Secondary hyperparathyroidism of renal origin: Secondary | ICD-10-CM | POA: Diagnosis not present

## 2022-02-25 DIAGNOSIS — D472 Monoclonal gammopathy: Secondary | ICD-10-CM | POA: Diagnosis not present

## 2022-02-25 DIAGNOSIS — D509 Iron deficiency anemia, unspecified: Secondary | ICD-10-CM | POA: Diagnosis not present

## 2022-02-25 DIAGNOSIS — Z992 Dependence on renal dialysis: Secondary | ICD-10-CM | POA: Diagnosis not present

## 2022-02-25 DIAGNOSIS — D688 Other specified coagulation defects: Secondary | ICD-10-CM | POA: Diagnosis not present

## 2022-02-26 DIAGNOSIS — E1129 Type 2 diabetes mellitus with other diabetic kidney complication: Secondary | ICD-10-CM | POA: Diagnosis not present

## 2022-02-26 DIAGNOSIS — Z992 Dependence on renal dialysis: Secondary | ICD-10-CM | POA: Diagnosis not present

## 2022-02-26 DIAGNOSIS — N186 End stage renal disease: Secondary | ICD-10-CM | POA: Diagnosis not present

## 2022-02-27 DIAGNOSIS — N2581 Secondary hyperparathyroidism of renal origin: Secondary | ICD-10-CM | POA: Diagnosis not present

## 2022-02-27 DIAGNOSIS — D472 Monoclonal gammopathy: Secondary | ICD-10-CM | POA: Diagnosis not present

## 2022-02-27 DIAGNOSIS — N186 End stage renal disease: Secondary | ICD-10-CM | POA: Diagnosis not present

## 2022-02-27 DIAGNOSIS — D509 Iron deficiency anemia, unspecified: Secondary | ICD-10-CM | POA: Diagnosis not present

## 2022-02-27 DIAGNOSIS — Z992 Dependence on renal dialysis: Secondary | ICD-10-CM | POA: Diagnosis not present

## 2022-02-27 DIAGNOSIS — E875 Hyperkalemia: Secondary | ICD-10-CM | POA: Diagnosis not present

## 2022-02-27 DIAGNOSIS — D688 Other specified coagulation defects: Secondary | ICD-10-CM | POA: Diagnosis not present

## 2022-02-27 DIAGNOSIS — D631 Anemia in chronic kidney disease: Secondary | ICD-10-CM | POA: Diagnosis not present

## 2022-03-04 DIAGNOSIS — E875 Hyperkalemia: Secondary | ICD-10-CM | POA: Diagnosis not present

## 2022-03-04 DIAGNOSIS — D631 Anemia in chronic kidney disease: Secondary | ICD-10-CM | POA: Diagnosis not present

## 2022-03-04 DIAGNOSIS — N2581 Secondary hyperparathyroidism of renal origin: Secondary | ICD-10-CM | POA: Diagnosis not present

## 2022-03-04 DIAGNOSIS — Z992 Dependence on renal dialysis: Secondary | ICD-10-CM | POA: Diagnosis not present

## 2022-03-04 DIAGNOSIS — D509 Iron deficiency anemia, unspecified: Secondary | ICD-10-CM | POA: Diagnosis not present

## 2022-03-04 DIAGNOSIS — N186 End stage renal disease: Secondary | ICD-10-CM | POA: Diagnosis not present

## 2022-03-04 DIAGNOSIS — D688 Other specified coagulation defects: Secondary | ICD-10-CM | POA: Diagnosis not present

## 2022-03-04 DIAGNOSIS — D472 Monoclonal gammopathy: Secondary | ICD-10-CM | POA: Diagnosis not present

## 2022-03-04 NOTE — Telephone Encounter (Signed)
I called [employer] and spoke to [facility].  She confirmed that they received the documentation they requested on 02/26/2022 and do not need anything else from the provider at this time.  They are ready to submit the request to the insurance company and then schedule repair.

## 2022-03-06 DIAGNOSIS — N2581 Secondary hyperparathyroidism of renal origin: Secondary | ICD-10-CM | POA: Diagnosis not present

## 2022-03-06 DIAGNOSIS — D472 Monoclonal gammopathy: Secondary | ICD-10-CM | POA: Diagnosis not present

## 2022-03-06 DIAGNOSIS — N186 End stage renal disease: Secondary | ICD-10-CM | POA: Diagnosis not present

## 2022-03-06 DIAGNOSIS — E875 Hyperkalemia: Secondary | ICD-10-CM | POA: Diagnosis not present

## 2022-03-06 DIAGNOSIS — D509 Iron deficiency anemia, unspecified: Secondary | ICD-10-CM | POA: Diagnosis not present

## 2022-03-06 DIAGNOSIS — D688 Other specified coagulation defects: Secondary | ICD-10-CM | POA: Diagnosis not present

## 2022-03-06 DIAGNOSIS — D631 Anemia in chronic kidney disease: Secondary | ICD-10-CM | POA: Diagnosis not present

## 2022-03-06 DIAGNOSIS — Z992 Dependence on renal dialysis: Secondary | ICD-10-CM | POA: Diagnosis not present

## 2022-03-09 DIAGNOSIS — Z992 Dependence on renal dialysis: Secondary | ICD-10-CM | POA: Diagnosis not present

## 2022-03-09 DIAGNOSIS — D509 Iron deficiency anemia, unspecified: Secondary | ICD-10-CM | POA: Diagnosis not present

## 2022-03-09 DIAGNOSIS — D688 Other specified coagulation defects: Secondary | ICD-10-CM | POA: Diagnosis not present

## 2022-03-09 DIAGNOSIS — D472 Monoclonal gammopathy: Secondary | ICD-10-CM | POA: Diagnosis not present

## 2022-03-09 DIAGNOSIS — E875 Hyperkalemia: Secondary | ICD-10-CM | POA: Diagnosis not present

## 2022-03-09 DIAGNOSIS — N186 End stage renal disease: Secondary | ICD-10-CM | POA: Diagnosis not present

## 2022-03-09 DIAGNOSIS — D631 Anemia in chronic kidney disease: Secondary | ICD-10-CM | POA: Diagnosis not present

## 2022-03-09 DIAGNOSIS — N2581 Secondary hyperparathyroidism of renal origin: Secondary | ICD-10-CM | POA: Diagnosis not present

## 2022-03-11 DIAGNOSIS — N186 End stage renal disease: Secondary | ICD-10-CM | POA: Diagnosis not present

## 2022-03-11 DIAGNOSIS — N2581 Secondary hyperparathyroidism of renal origin: Secondary | ICD-10-CM | POA: Diagnosis not present

## 2022-03-11 DIAGNOSIS — D631 Anemia in chronic kidney disease: Secondary | ICD-10-CM | POA: Diagnosis not present

## 2022-03-11 DIAGNOSIS — E875 Hyperkalemia: Secondary | ICD-10-CM | POA: Diagnosis not present

## 2022-03-11 DIAGNOSIS — D688 Other specified coagulation defects: Secondary | ICD-10-CM | POA: Diagnosis not present

## 2022-03-11 DIAGNOSIS — D509 Iron deficiency anemia, unspecified: Secondary | ICD-10-CM | POA: Diagnosis not present

## 2022-03-11 DIAGNOSIS — D472 Monoclonal gammopathy: Secondary | ICD-10-CM | POA: Diagnosis not present

## 2022-03-11 DIAGNOSIS — Z992 Dependence on renal dialysis: Secondary | ICD-10-CM | POA: Diagnosis not present

## 2022-03-12 DIAGNOSIS — M7542 Impingement syndrome of left shoulder: Secondary | ICD-10-CM | POA: Diagnosis not present

## 2022-03-12 DIAGNOSIS — S88119A Complete traumatic amputation at level between knee and ankle, unspecified lower leg, initial encounter: Secondary | ICD-10-CM | POA: Diagnosis not present

## 2022-03-12 DIAGNOSIS — M7541 Impingement syndrome of right shoulder: Secondary | ICD-10-CM | POA: Diagnosis not present

## 2022-03-12 DIAGNOSIS — N184 Chronic kidney disease, stage 4 (severe): Secondary | ICD-10-CM | POA: Diagnosis not present

## 2022-03-12 DIAGNOSIS — I5032 Chronic diastolic (congestive) heart failure: Secondary | ICD-10-CM | POA: Diagnosis not present

## 2022-03-15 ENCOUNTER — Ambulatory Visit: Payer: Medicare Other | Admitting: Internal Medicine

## 2022-03-17 DIAGNOSIS — N186 End stage renal disease: Secondary | ICD-10-CM | POA: Diagnosis not present

## 2022-03-17 DIAGNOSIS — Z992 Dependence on renal dialysis: Secondary | ICD-10-CM | POA: Diagnosis not present

## 2022-03-17 DIAGNOSIS — I132 Hypertensive heart and chronic kidney disease with heart failure and with stage 5 chronic kidney disease, or end stage renal disease: Secondary | ICD-10-CM | POA: Diagnosis not present

## 2022-03-17 DIAGNOSIS — I509 Heart failure, unspecified: Secondary | ICD-10-CM | POA: Diagnosis not present

## 2022-03-17 DIAGNOSIS — E1122 Type 2 diabetes mellitus with diabetic chronic kidney disease: Secondary | ICD-10-CM | POA: Diagnosis not present

## 2022-03-24 ENCOUNTER — Ambulatory Visit: Payer: Self-pay

## 2022-03-24 ENCOUNTER — Telehealth: Payer: Self-pay | Admitting: Emergency Medicine

## 2022-03-24 NOTE — Patient Instructions (Signed)
Visit Information  Thank you for taking time to visit with me today. Please don't hesitate to contact me if I can be of assistance to you.   Following are the goals we discussed today:   Goals Addressed   None     Please call the care guide team at [phone] if you need to schedule a future appointment with me.  If you are experiencing a Mental Health or [place] or need someone to talk to, please go to [facility] [address], [place] [phone])  Patient verbalizes understanding of instructions and care plan provided today and agrees to view in [place]. Active MyChart status and patient understanding of how to access instructions and care plan via MyChart confirmed with patient.     No further follow up required: The patient is encouraged to contact care coordination team as needed.  Daneen Schick, BSW, CDP Social Worker, Certified Dementia Practitioner Care Coordination [phone]

## 2022-03-24 NOTE — Telephone Encounter (Signed)
Copied from [place] [id number]. Topic: General - Other >> Mar 24, 2022 11:27 AM Cyndi Bender wrote: Reason for CRM: Glenard Haring with [employer] called for update on paperwork sent on 03/18/22 regarding equipment repairs. Cb# [phone]

## 2022-03-24 NOTE — Patient Outreach (Signed)
  Care Coordination   Initial Visit Note   03/24/2022 Name: JAMARII BANKS MRN: [id number] DOB: 1951/03/25  ANGELL HONSE is a 71 y.o. year old male who sees [name], MD for primary care. I spoke with  [name] by phone today  What matters to the patients health and wellness today?  No concerns   Goals Addressed   None     SDOH assessments and interventions completed:   Yes SDOH Interventions Today    Flowsheet Row Most Recent Value  SDOH Interventions   Food Insecurity Interventions Intervention Not Indicated  Housing Interventions Intervention Not Indicated  Transportation Interventions Intervention Not Indicated       Care Coordination Interventions Activated:  No Care Coordination Interventions:  No, not indicated  Follow up plan: No further intervention required.  Encounter Outcome:  Pt. Visit Completed  Daneen Schick, BSW, CDP Social Worker, Certified Dementia Practitioner Care Coordination [phone]

## 2022-03-25 NOTE — Telephone Encounter (Signed)
Order has been faxed over to [facility] today.

## 2022-03-27 ENCOUNTER — Other Ambulatory Visit: Payer: Self-pay

## 2022-03-27 ENCOUNTER — Emergency Department (HOSPITAL_COMMUNITY): Payer: Medicare Other

## 2022-03-27 ENCOUNTER — Emergency Department (HOSPITAL_COMMUNITY)
Admission: EM | Admit: 2022-03-27 | Discharge: 2022-03-27 | Disposition: A | Discharge status: Home / Self Care | Payer: Medicare Other | Attending: Emergency Medicine | Admitting: Emergency Medicine

## 2022-03-27 DIAGNOSIS — Z7401 Bed confinement status: Secondary | ICD-10-CM | POA: Diagnosis not present

## 2022-03-27 DIAGNOSIS — N2581 Secondary hyperparathyroidism of renal origin: Secondary | ICD-10-CM | POA: Diagnosis not present

## 2022-03-27 DIAGNOSIS — I120 Hypertensive chronic kidney disease with stage 5 chronic kidney disease or end stage renal disease: Secondary | ICD-10-CM | POA: Diagnosis not present

## 2022-03-27 DIAGNOSIS — E11649 Type 2 diabetes mellitus with hypoglycemia without coma: Secondary | ICD-10-CM | POA: Diagnosis not present

## 2022-03-27 DIAGNOSIS — Z794 Long term (current) use of insulin: Secondary | ICD-10-CM | POA: Insufficient documentation

## 2022-03-27 DIAGNOSIS — N186 End stage renal disease: Secondary | ICD-10-CM | POA: Diagnosis not present

## 2022-03-27 DIAGNOSIS — R404 Transient alteration of awareness: Secondary | ICD-10-CM | POA: Diagnosis not present

## 2022-03-27 DIAGNOSIS — D688 Other specified coagulation defects: Secondary | ICD-10-CM | POA: Diagnosis not present

## 2022-03-27 DIAGNOSIS — Z79899 Other long term (current) drug therapy: Secondary | ICD-10-CM | POA: Diagnosis not present

## 2022-03-27 DIAGNOSIS — I132 Hypertensive heart and chronic kidney disease with heart failure and with stage 5 chronic kidney disease, or end stage renal disease: Secondary | ICD-10-CM | POA: Diagnosis not present

## 2022-03-27 DIAGNOSIS — E162 Hypoglycemia, unspecified: Secondary | ICD-10-CM | POA: Diagnosis present

## 2022-03-27 DIAGNOSIS — D472 Monoclonal gammopathy: Secondary | ICD-10-CM | POA: Diagnosis not present

## 2022-03-27 DIAGNOSIS — Z992 Dependence on renal dialysis: Secondary | ICD-10-CM | POA: Diagnosis not present

## 2022-03-27 DIAGNOSIS — I509 Heart failure, unspecified: Secondary | ICD-10-CM | POA: Insufficient documentation

## 2022-03-27 DIAGNOSIS — J811 Chronic pulmonary edema: Secondary | ICD-10-CM | POA: Diagnosis not present

## 2022-03-27 DIAGNOSIS — R2681 Unsteadiness on feet: Secondary | ICD-10-CM | POA: Diagnosis not present

## 2022-03-27 DIAGNOSIS — D509 Iron deficiency anemia, unspecified: Secondary | ICD-10-CM | POA: Diagnosis not present

## 2022-03-27 DIAGNOSIS — Z743 Need for continuous supervision: Secondary | ICD-10-CM | POA: Diagnosis not present

## 2022-03-27 DIAGNOSIS — D631 Anemia in chronic kidney disease: Secondary | ICD-10-CM | POA: Diagnosis not present

## 2022-03-27 DIAGNOSIS — R410 Disorientation, unspecified: Secondary | ICD-10-CM | POA: Diagnosis not present

## 2022-03-27 DIAGNOSIS — R6889 Other general symptoms and signs: Secondary | ICD-10-CM | POA: Diagnosis not present

## 2022-03-27 DIAGNOSIS — E875 Hyperkalemia: Secondary | ICD-10-CM | POA: Diagnosis not present

## 2022-03-27 LAB — CBC
HCT: 29 % — ABNORMAL LOW (ref 39.0–52.0)
Hemoglobin: 10.1 g/dL — ABNORMAL LOW (ref 13.0–17.0)
MCH: 30.1 pg (ref 26.0–34.0)
MCHC: 34.8 g/dL (ref 30.0–36.0)
MCV: 86.6 fL (ref 80.0–100.0)
Platelets: 342 10*3/uL (ref 150–400)
RBC: 3.35 MIL/uL — ABNORMAL LOW (ref 4.22–5.81)
RDW: 15.9 % — ABNORMAL HIGH (ref 11.5–15.5)
WBC: 4.9 10*3/uL (ref 4.0–10.5)
nRBC: 0 % (ref 0.0–0.2)

## 2022-03-27 LAB — COMPREHENSIVE METABOLIC PANEL
ALT: 8 U/L (ref 0–44)
AST: 8 U/L — ABNORMAL LOW (ref 15–41)
Albumin: 3.2 g/dL — ABNORMAL LOW (ref 3.5–5.0)
Alkaline Phosphatase: 75 U/L (ref 38–126)
Anion gap: 17 — ABNORMAL HIGH (ref 5–15)
BUN: 63 mg/dL — ABNORMAL HIGH (ref 8–23)
CO2: 22 mmol/L (ref 22–32)
Calcium: 8.7 mg/dL — ABNORMAL LOW (ref 8.9–10.3)
Chloride: 99 mmol/L (ref 98–111)
Creatinine, Ser: 13.06 mg/dL — ABNORMAL HIGH (ref 0.61–1.24)
GFR, Estimated: 4 mL/min — ABNORMAL LOW (ref >60)
Glucose, Bld: 96 mg/dL (ref 70–99)
Potassium: 3.7 mmol/L (ref 3.5–5.1)
Sodium: 138 mmol/L (ref 135–145)
Total Bilirubin: 0.7 mg/dL (ref 0.3–1.2)
Total Protein: 7.2 g/dL (ref 6.5–8.1)

## 2022-03-27 LAB — CBG MONITORING, ED
Glucose-Capillary: 88 mg/dL (ref 70–99)
Glucose-Capillary: 102 mg/dL — ABNORMAL HIGH (ref 70–99)
Glucose-Capillary: 45 mg/dL — ABNORMAL LOW (ref 70–99)
Glucose-Capillary: 97 mg/dL (ref 70–99)

## 2022-03-27 LAB — MAGNESIUM: Magnesium: 2.1 mg/dL (ref 1.7–2.4)

## 2022-03-27 MED ORDER — SODIUM CHLORIDE 0.9% FLUSH: 3.0000 mL | INTRAVENOUS | Status: DC | PRN

## 2022-03-27 MED ORDER — SODIUM CHLORIDE 0.9 % IV SOLN: 250.0000 mL | INTRAVENOUS | Status: DC | PRN

## 2022-03-27 MED ORDER — SODIUM CHLORIDE 0.9% FLUSH
3.0000 mL | Freq: Two times a day (BID) | INTRAVENOUS | Status: DC
  Administered 2022-03-27: 3 mL via INTRAVENOUS

## 2022-03-27 NOTE — ED Notes (Addendum)
Pt provided with dinner tray, but did not eat anything at this time.

## 2022-03-27 NOTE — ED Provider Notes (Signed)
[facility] EMERGENCY DEPARTMENT Provider Note   CSN: [id number] Arrival date & time: 03/27/22  1312     History  Chief Complaint  Patient presents with   Altered Mental Status    Mark [name] is a 71 y.o. male.  Pt is a 71 yo male with a hx of ESRD on HD (Tue, Thu, and Sat), PAD s/p left BKA, CHF, DM, HTN, high cholesterol, seizures (while using drugs), cocaine abuse, GERD, and chronic anemia.  He was in [place] for several days for a funeral.  He went to the ED there on 7/19 and was told that he did not meet criteria for inpatient dialysis.  He did not try to go after that.  He went to his normal dialysis this am and received about half of the session.  He had an episode of AMS.  EMS was called and checked his BS.  It was 79 (but EMS said they've been having trouble with their monitors).  They gave him oral glucose twice and pt is now awake and alert.  He said he's hungry.  He did not eat breakfast this am.  He otherwise feels ok now.  He denies any sob.  No fevers.         Home Medications Prior to Admission medications   Medication Sig Start Date End Date Taking? Authorizing Provider  acetaminophen (TYLENOL) 650 MG CR tablet Take 1 tablet (650 mg total) by mouth 2 (two) times daily as needed for pain. 12/11/21   Johnson, Deborah B, MD  atorvastatin (LIPITOR) 20 MG tablet Take 1 tablet (20 mg total) by mouth daily. Patient not taking: Reported on 02/09/2022 02/05/21   Johnson, Deborah B, MD  Blood Glucose Monitoring Suppl (ONETOUCH VERIO) w/Device KIT Use as directed to test blood sugar three times daily. 03/23/19   Johnson, Deborah B, MD  carvedilol (COREG) 25 MG tablet Take 1 tablet (25 mg total) by mouth 2 (two) times daily. 02/02/22   Johnson, Deborah B, MD  furosemide (LASIX) 80 MG tablet Take 1 tablet (80 mg total) by mouth daily. 02/02/22   Johnson, Deborah B, MD  glucose blood (ONETOUCH VERIO) test strip USE AS DIRECTED THREE TIMES DAILY TO  TEST  BLOOD  SUGAR  03/15/21   Johnson, Deborah B, MD  insulin glargine (LANTUS SOLOSTAR) 100 UNIT/ML Solostar Pen Inject 20 Units into the skin at bedtime. 09/21/21   Johnson, Deborah B, MD  Insulin Pen Needle (TRUEPLUS 5-BEVEL PEN NEEDLES) 32G X 4 MM MISC Use to administer Lantus once daily. 02/19/21   Johnson, Deborah B, MD  Insulin Syringe-Needle U-100 (INSULIN SYRINGE .5CC/30GX5/16") 30G X 5/16" 0.5 ML MISC Check blood sugar TID & QHS 10/30/14   Advani, Deepak, MD  lanthanum (FOSRENOL) 1000 MG chewable tablet Chew 1,000 mg by mouth daily. 07/27/21   [provider]  midodrine (PROAMATINE) 5 MG tablet Take 5 mg by mouth in the morning and at bedtime. Do not takes in bp is lower 130/80 04/04/21   [provider]  Multiple Vitamin (MULTIVITAMIN WITH MINERALS) TABS tablet Take 1 tablet by mouth daily.    [provider]  OneTouch Delica Lancets 33G MISC Use as directed to test blood sugar three times daily. 06/17/21   Newlin, Enobong, MD  VITAMIN D PO Take 1 capsule by mouth daily.    [provider]      Allergies    Patient has no known allergies.    Review of Systems     Review of Systems  All other systems reviewed and are negative.   Physical Exam Updated Vital Signs BP (!) 161/80   Pulse 69   Temp 98 F (36.7 C) (Oral)   Resp 19   SpO2 95%  Physical Exam Vitals and nursing note reviewed.  Constitutional:      Appearance: Normal appearance.  HENT:     Head: Normocephalic and atraumatic.     Right Ear: External ear normal.     Left Ear: External ear normal.     Nose: Nose normal.     Mouth/Throat:     Mouth: Mucous membranes are moist.     Pharynx: Oropharynx is clear.  Eyes:     Extraocular Movements: Extraocular movements intact.     Conjunctiva/sclera: Conjunctivae normal.     Pupils: Pupils are equal, round, and reactive to light.  Cardiovascular:     Rate and Rhythm: Normal rate and regular rhythm.     Pulses: Normal pulses.     Heart sounds: Normal  heart sounds.  Pulmonary:     Effort: Pulmonary effort is normal.     Breath sounds: Normal breath sounds.  Abdominal:     General: Abdomen is flat. Bowel sounds are normal.     Palpations: Abdomen is soft.  Musculoskeletal:     Cervical back: Normal range of motion and neck supple.     Comments: Left upper arm + AVF with good thrill Left BKA  Skin:    General: Skin is warm.     Capillary Refill: Capillary refill takes less than 2 seconds.  Neurological:     General: No focal deficit present.     Mental Status: He is alert and oriented to person, place, and time.  Psychiatric:        Mood and Affect: Mood normal.        Behavior: Behavior normal.     ED Results / Procedures / Treatments   Labs (all labs ordered are listed, but only abnormal results are displayed) Labs Reviewed  COMPREHENSIVE METABOLIC PANEL - Abnormal; Notable for the following components:      Result Value   BUN 63 (*)    Creatinine, Ser 13.06 (*)    Calcium 8.7 (*)    Albumin 3.2 (*)    AST 8 (*)    GFR, Estimated 4 (*)    Anion gap 17 (*)    All other components within normal limits  CBC - Abnormal; Notable for the following components:   RBC 3.35 (*)    Hemoglobin 10.1 (*)    HCT 29.0 (*)    RDW 15.9 (*)    All other components within normal limits  CBG MONITORING, ED - Abnormal; Notable for the following components:   Glucose-Capillary 45 (*)    All other components within normal limits  CBG MONITORING, ED - Abnormal; Notable for the following components:   Glucose-Capillary 102 (*)    All other components within normal limits  MAGNESIUM  CBG MONITORING, ED  CBG MONITORING, ED    EKG EKG Interpretation  Date/Time:  Saturday March 27 2022 13:21:16 EDT Ventricular Rate:  81 PR Interval:  197 QRS Duration: 102 QT Interval:  431 QTC Calculation: 501 R Axis:   -49 Text Interpretation: Sinus rhythm LAD, consider left anterior fascicular block Borderline repolarization abnormality  Prolonged QT interval Artifact in lead(s) I III aVR aVL No significant change since last tracing Confirmed by Isla Pence [phone]) on 03/27/2022 1:40:27 PM  Radiology  DG Chest Port 1 View  Result Date: 03/27/2022 CLINICAL DATA:  ams EXAM: PORTABLE CHEST 1 VIEW COMPARISON:  February 12, 2022 FINDINGS: Stable cardiomegaly and aortic atherosclerosis. Low lung volumes. Mild pulmonary vascular congestion. Likely minor atelectasis at the left lung base. The visualized skeletal structures are unremarkable. IMPRESSION: Mild cardiomegaly and pulmonary vascular congestion without significant interval change. Electronically Signed   By: Amar  Amaresh M.D.   On: 03/27/2022 13:41    Procedures Procedures    Medications Ordered in ED Medications  sodium chloride flush (NS) 0.9 % injection 3 mL (3 mLs Intravenous Given 03/27/22 1400)  sodium chloride flush (NS) 0.9 % injection 3 mL (has no administration in time range)  0.9 %  sodium chloride infusion (has no administration in time range)    ED Course/ Medical Decision Making/ A&P                           Medical Decision Making Amount and/or Complexity of Data Reviewed Labs: ordered. Radiology: ordered.  Risk Prescription drug management.   This patient presents to the ED for concern of ams, this involves an extensive number of treatment options, and is a complaint that carries with it a high risk of complications and morbidity.  The differential diagnosis includes hypoglycemia, infection, tia   Co morbidities that complicate the patient evaluation  ESRD on HD (Tue, Thu, and Sat), PAD s/p left BKA, CHF, DM, HTN, high cholesterol, seizures (while using drugs), cocaine abuse, GERD, and chronic anemia   Additional history obtained:  Additional history obtained from epic chart review External records from outside source obtained and reviewed including EMS report   Lab Tests:  I Ordered, and personally interpreted labs.  The pertinent  results include:  cbc with chronic anemia (hgb 10), cmp with bun 63 and cr 13.06 (chronic); k nl   Imaging Studies ordered:  I ordered imaging studies including CXR  I independently visualized and interpreted imaging which showed  IMPRESSION:  Mild cardiomegaly and pulmonary vascular congestion without  significant interval change.       I agree with the radiologist interpretation   Cardiac Monitoring:  The patient was maintained on a cardiac monitor.  I personally viewed and interpreted the cardiac monitored which showed an underlying rhythm of: nsr   Medicines ordered and prescription drug management:  I ordered medication including food  for hypoglycemia  Reevaluation of the patient after these medicines showed that the patient improved I have reviewed the patients home medicines and have made adjustments as needed   Critical Interventions:  food  Problem List / ED Course:  AMS:  likely due to hypoglycemia as he got better after taking oral glucose.  He has eaten well here.  He has been normal mental status all day.  He is asking to go home.  However, his Hover[employer] is at the dialysis center.  The social worker went to the center, but did not have a wheelchair lift on her car and was unable to bring it back.  She even called EMS to see if they could all lift it.  No go.  Pt does have a regular wheelchair at home.  I offered admission as a boarder until Monday, but he refuses strongly.  His family is still out of town for the funeral.  He said he has friends that can help him.  We will have [facility] take him home.  He is to hold his insulin tonight.    He is to eat small, frequent meals.  He is to return if worse.  F/u with pcp.   Reevaluation:  After the interventions noted above, I reevaluated the patient and found that they have :improved   Social Determinants of Health:  Lives alone   Dispostion:  After consideration of the diagnostic results and the patients response  to treatment, I feel that the patent would benefit from discharge with outpatient f/u.          Final Clinical Impression(s) / ED Diagnoses Final diagnoses:  Hypoglycemia  ESRD on hemodialysis [facility])    Rx / DC Orders ED Discharge Orders     None         Isla Pence, MD 03/27/22 1719

## 2022-03-27 NOTE — ED Notes (Signed)
Pt provided discharge instructions and prescription information. Pt was given the opportunity to ask questions and questions were answered.   

## 2022-03-27 NOTE — ED Notes (Signed)
[facility] called 

## 2022-03-27 NOTE — ED Notes (Signed)
BGL reading 45 was intended to be rejected.

## 2022-03-27 NOTE — Discharge Instructions (Addendum)
Do not take your insulin tonight.  Check your blood sugar tomorrow.  If it is still low, you will need to hold the insulin again.  Eat small, frequent meals.

## 2022-03-27 NOTE — Care Management (Addendum)
Patient here for hypoglycemia and confusion at dialysis. He has skipped some sessions. His brother recently passed away and his family is in [place] at the funeral.   Damaris Schooner to the patient, he was drowsy but answering questions appropriately. Stated he has his Journalist, newspaper at dialysis.  [facility] is where he goes. Called Dialysis center and spoke to Bradford about his chair. They have it with a note to the social worker for Monday. Called patients wife. She states everyone is out of town. He takes wheelchair van to and from dialysis. No one is available to pick up chair this week. Patient has a manual wheelchair at home but he cannot really get around in this, as  he has upper extremity weakness.   Called [facility] to see if they could pick up chair, called EMS.  Both do not have it in their scope or resources to do this.  This RNCM went to the dialysis center to see if it could be picked up, it is too heavy to be lifted. Discussed with Dr. Gilford Raid. Patient is asking to go home, however he does not have any supervision or  his electric wheelchair, as the dialysis center was closing. TOC leadership aware of issue.  Patient may have to be boarded until , Monday, when he can go to the dialysis center and be transported home.  The dialysis center number is [phone] Patient is insistent on going home. Called wife, however mailbox is full.

## 2022-03-27 NOTE — ED Triage Notes (Signed)
Pt bib [facility] from dialysis for AMS. Pt CBG was 79 with EMS. Pt was given 2 things of oral glucose. Pt usually gets 4.2L of fluid off, pt only got 2.5L off. Pt has missed 6 dialysis appointments.

## 2022-03-29 DIAGNOSIS — N186 End stage renal disease: Secondary | ICD-10-CM | POA: Diagnosis not present

## 2022-03-29 DIAGNOSIS — Z992 Dependence on renal dialysis: Secondary | ICD-10-CM | POA: Diagnosis not present

## 2022-03-29 DIAGNOSIS — E1129 Type 2 diabetes mellitus with other diabetic kidney complication: Secondary | ICD-10-CM | POA: Diagnosis not present

## 2022-03-30 DIAGNOSIS — M726 Necrotizing fasciitis: Secondary | ICD-10-CM | POA: Diagnosis not present

## 2022-03-30 DIAGNOSIS — S81801A Unspecified open wound, right lower leg, initial encounter: Secondary | ICD-10-CM | POA: Diagnosis not present

## 2022-03-30 DIAGNOSIS — L0390 Cellulitis, unspecified: Secondary | ICD-10-CM | POA: Diagnosis not present

## 2022-03-30 DIAGNOSIS — A419 Sepsis, unspecified organism: Secondary | ICD-10-CM | POA: Diagnosis not present

## 2022-03-30 DIAGNOSIS — M79605 Pain in left leg: Secondary | ICD-10-CM | POA: Diagnosis not present

## 2022-04-01 DIAGNOSIS — D472 Monoclonal gammopathy: Secondary | ICD-10-CM | POA: Diagnosis not present

## 2022-04-01 DIAGNOSIS — N2581 Secondary hyperparathyroidism of renal origin: Secondary | ICD-10-CM | POA: Diagnosis not present

## 2022-04-01 DIAGNOSIS — D631 Anemia in chronic kidney disease: Secondary | ICD-10-CM | POA: Diagnosis not present

## 2022-04-01 DIAGNOSIS — R519 Headache, unspecified: Secondary | ICD-10-CM | POA: Diagnosis not present

## 2022-04-01 DIAGNOSIS — Z992 Dependence on renal dialysis: Secondary | ICD-10-CM | POA: Diagnosis not present

## 2022-04-01 DIAGNOSIS — N186 End stage renal disease: Secondary | ICD-10-CM | POA: Diagnosis not present

## 2022-04-01 DIAGNOSIS — R52 Pain, unspecified: Secondary | ICD-10-CM | POA: Diagnosis not present

## 2022-04-01 DIAGNOSIS — R509 Fever, unspecified: Secondary | ICD-10-CM | POA: Diagnosis not present

## 2022-04-01 DIAGNOSIS — D688 Other specified coagulation defects: Secondary | ICD-10-CM | POA: Diagnosis not present

## 2022-04-03 DIAGNOSIS — D631 Anemia in chronic kidney disease: Secondary | ICD-10-CM | POA: Diagnosis not present

## 2022-04-03 DIAGNOSIS — N186 End stage renal disease: Secondary | ICD-10-CM | POA: Diagnosis not present

## 2022-04-03 DIAGNOSIS — D688 Other specified coagulation defects: Secondary | ICD-10-CM | POA: Diagnosis not present

## 2022-04-03 DIAGNOSIS — Z992 Dependence on renal dialysis: Secondary | ICD-10-CM | POA: Diagnosis not present

## 2022-04-03 DIAGNOSIS — D472 Monoclonal gammopathy: Secondary | ICD-10-CM | POA: Diagnosis not present

## 2022-04-03 DIAGNOSIS — R519 Headache, unspecified: Secondary | ICD-10-CM | POA: Diagnosis not present

## 2022-04-03 DIAGNOSIS — R52 Pain, unspecified: Secondary | ICD-10-CM | POA: Diagnosis not present

## 2022-04-03 DIAGNOSIS — N2581 Secondary hyperparathyroidism of renal origin: Secondary | ICD-10-CM | POA: Diagnosis not present

## 2022-04-03 DIAGNOSIS — R509 Fever, unspecified: Secondary | ICD-10-CM | POA: Diagnosis not present

## 2022-04-08 ENCOUNTER — Inpatient Hospital Stay: Payer: Medicare Other | Admitting: Internal Medicine

## 2022-04-08 DIAGNOSIS — D688 Other specified coagulation defects: Secondary | ICD-10-CM | POA: Diagnosis not present

## 2022-04-08 DIAGNOSIS — R519 Headache, unspecified: Secondary | ICD-10-CM | POA: Diagnosis not present

## 2022-04-08 DIAGNOSIS — R52 Pain, unspecified: Secondary | ICD-10-CM | POA: Diagnosis not present

## 2022-04-08 DIAGNOSIS — D472 Monoclonal gammopathy: Secondary | ICD-10-CM | POA: Diagnosis not present

## 2022-04-08 DIAGNOSIS — R509 Fever, unspecified: Secondary | ICD-10-CM | POA: Diagnosis not present

## 2022-04-08 DIAGNOSIS — Z992 Dependence on renal dialysis: Secondary | ICD-10-CM | POA: Diagnosis not present

## 2022-04-08 DIAGNOSIS — D631 Anemia in chronic kidney disease: Secondary | ICD-10-CM | POA: Diagnosis not present

## 2022-04-08 DIAGNOSIS — N2581 Secondary hyperparathyroidism of renal origin: Secondary | ICD-10-CM | POA: Diagnosis not present

## 2022-04-08 DIAGNOSIS — N186 End stage renal disease: Secondary | ICD-10-CM | POA: Diagnosis not present

## 2022-04-10 DIAGNOSIS — R52 Pain, unspecified: Secondary | ICD-10-CM | POA: Diagnosis not present

## 2022-04-10 DIAGNOSIS — N2581 Secondary hyperparathyroidism of renal origin: Secondary | ICD-10-CM | POA: Diagnosis not present

## 2022-04-10 DIAGNOSIS — D688 Other specified coagulation defects: Secondary | ICD-10-CM | POA: Diagnosis not present

## 2022-04-10 DIAGNOSIS — R519 Headache, unspecified: Secondary | ICD-10-CM | POA: Diagnosis not present

## 2022-04-10 DIAGNOSIS — R509 Fever, unspecified: Secondary | ICD-10-CM | POA: Diagnosis not present

## 2022-04-10 DIAGNOSIS — D631 Anemia in chronic kidney disease: Secondary | ICD-10-CM | POA: Diagnosis not present

## 2022-04-10 DIAGNOSIS — Z992 Dependence on renal dialysis: Secondary | ICD-10-CM | POA: Diagnosis not present

## 2022-04-10 DIAGNOSIS — D472 Monoclonal gammopathy: Secondary | ICD-10-CM | POA: Diagnosis not present

## 2022-04-10 DIAGNOSIS — N186 End stage renal disease: Secondary | ICD-10-CM | POA: Diagnosis not present

## 2022-04-13 ENCOUNTER — Telehealth: Payer: Self-pay | Admitting: Emergency Medicine

## 2022-04-13 DIAGNOSIS — N186 End stage renal disease: Secondary | ICD-10-CM | POA: Diagnosis not present

## 2022-04-13 DIAGNOSIS — N2581 Secondary hyperparathyroidism of renal origin: Secondary | ICD-10-CM | POA: Diagnosis not present

## 2022-04-13 DIAGNOSIS — R519 Headache, unspecified: Secondary | ICD-10-CM | POA: Diagnosis not present

## 2022-04-13 DIAGNOSIS — R509 Fever, unspecified: Secondary | ICD-10-CM | POA: Diagnosis not present

## 2022-04-13 DIAGNOSIS — D688 Other specified coagulation defects: Secondary | ICD-10-CM | POA: Diagnosis not present

## 2022-04-13 DIAGNOSIS — M25561 Pain in right knee: Secondary | ICD-10-CM

## 2022-04-13 DIAGNOSIS — Z992 Dependence on renal dialysis: Secondary | ICD-10-CM | POA: Diagnosis not present

## 2022-04-13 DIAGNOSIS — R52 Pain, unspecified: Secondary | ICD-10-CM | POA: Diagnosis not present

## 2022-04-13 DIAGNOSIS — D631 Anemia in chronic kidney disease: Secondary | ICD-10-CM | POA: Diagnosis not present

## 2022-04-13 DIAGNOSIS — D472 Monoclonal gammopathy: Secondary | ICD-10-CM | POA: Diagnosis not present

## 2022-04-13 NOTE — Telephone Encounter (Signed)
Copied from [place] [phone]. Topic: General - Other >> Apr 13, 2022  8:50 AM Ludger Nutting wrote: Patient called in wanting to speak with Dr. Wynetta Emery. He wouldn't let me know what it was regarding. Please follow up with patient.

## 2022-04-13 NOTE — Telephone Encounter (Signed)
Attempted to reach pt. Unable to LVM due to no VM being set up .-----DD,RMA

## 2022-04-15 DIAGNOSIS — D688 Other specified coagulation defects: Secondary | ICD-10-CM | POA: Diagnosis not present

## 2022-04-15 DIAGNOSIS — D472 Monoclonal gammopathy: Secondary | ICD-10-CM | POA: Diagnosis not present

## 2022-04-15 DIAGNOSIS — R52 Pain, unspecified: Secondary | ICD-10-CM | POA: Diagnosis not present

## 2022-04-15 DIAGNOSIS — N186 End stage renal disease: Secondary | ICD-10-CM | POA: Diagnosis not present

## 2022-04-15 DIAGNOSIS — R519 Headache, unspecified: Secondary | ICD-10-CM | POA: Diagnosis not present

## 2022-04-15 DIAGNOSIS — R509 Fever, unspecified: Secondary | ICD-10-CM | POA: Diagnosis not present

## 2022-04-15 DIAGNOSIS — Z992 Dependence on renal dialysis: Secondary | ICD-10-CM | POA: Diagnosis not present

## 2022-04-15 DIAGNOSIS — D631 Anemia in chronic kidney disease: Secondary | ICD-10-CM | POA: Diagnosis not present

## 2022-04-15 DIAGNOSIS — N2581 Secondary hyperparathyroidism of renal origin: Secondary | ICD-10-CM | POA: Diagnosis not present

## 2022-04-16 NOTE — Telephone Encounter (Signed)
Called spoke w/ pt in regards to concern. Pt states that he has been experiencing constant Rt knee pain for 3wks. 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   Pt requesting x-ray stating pain is causing walking to become difficult. Pt denies any falls or injury to Rt knee. Informed pt message will be sent to provider. Pt expressed understanding. Please advise.---DD,RMA

## 2022-04-17 NOTE — Addendum Note (Signed)
Addended by: Karle Plumber B on: 04/17/2022 11:00 AM   Modules accepted: Orders

## 2022-04-19 DIAGNOSIS — M7542 Impingement syndrome of left shoulder: Secondary | ICD-10-CM | POA: Diagnosis not present

## 2022-04-19 DIAGNOSIS — M7541 Impingement syndrome of right shoulder: Secondary | ICD-10-CM | POA: Diagnosis not present

## 2022-04-19 DIAGNOSIS — I5032 Chronic diastolic (congestive) heart failure: Secondary | ICD-10-CM | POA: Diagnosis not present

## 2022-04-19 DIAGNOSIS — S88119A Complete traumatic amputation at level between knee and ankle, unspecified lower leg, initial encounter: Secondary | ICD-10-CM | POA: Diagnosis not present

## 2022-04-19 DIAGNOSIS — N184 Chronic kidney disease, stage 4 (severe): Secondary | ICD-10-CM | POA: Diagnosis not present

## 2022-04-20 DIAGNOSIS — R52 Pain, unspecified: Secondary | ICD-10-CM | POA: Diagnosis not present

## 2022-04-20 DIAGNOSIS — D472 Monoclonal gammopathy: Secondary | ICD-10-CM | POA: Diagnosis not present

## 2022-04-20 DIAGNOSIS — R509 Fever, unspecified: Secondary | ICD-10-CM | POA: Diagnosis not present

## 2022-04-20 DIAGNOSIS — D631 Anemia in chronic kidney disease: Secondary | ICD-10-CM | POA: Diagnosis not present

## 2022-04-20 DIAGNOSIS — D688 Other specified coagulation defects: Secondary | ICD-10-CM | POA: Diagnosis not present

## 2022-04-20 DIAGNOSIS — R519 Headache, unspecified: Secondary | ICD-10-CM | POA: Diagnosis not present

## 2022-04-20 DIAGNOSIS — N186 End stage renal disease: Secondary | ICD-10-CM | POA: Diagnosis not present

## 2022-04-20 DIAGNOSIS — N2581 Secondary hyperparathyroidism of renal origin: Secondary | ICD-10-CM | POA: Diagnosis not present

## 2022-04-20 DIAGNOSIS — Z992 Dependence on renal dialysis: Secondary | ICD-10-CM | POA: Diagnosis not present

## 2022-04-21 NOTE — Telephone Encounter (Signed)
Pt informed of not per pcp per Knee x-ray & ortho referral. Pt expressed understanding. ----DD,RMA

## 2022-04-22 DIAGNOSIS — R509 Fever, unspecified: Secondary | ICD-10-CM | POA: Diagnosis not present

## 2022-04-22 DIAGNOSIS — R519 Headache, unspecified: Secondary | ICD-10-CM | POA: Diagnosis not present

## 2022-04-22 DIAGNOSIS — R52 Pain, unspecified: Secondary | ICD-10-CM | POA: Diagnosis not present

## 2022-04-22 DIAGNOSIS — D472 Monoclonal gammopathy: Secondary | ICD-10-CM | POA: Diagnosis not present

## 2022-04-22 DIAGNOSIS — N186 End stage renal disease: Secondary | ICD-10-CM | POA: Diagnosis not present

## 2022-04-22 DIAGNOSIS — D631 Anemia in chronic kidney disease: Secondary | ICD-10-CM | POA: Diagnosis not present

## 2022-04-22 DIAGNOSIS — N2581 Secondary hyperparathyroidism of renal origin: Secondary | ICD-10-CM | POA: Diagnosis not present

## 2022-04-22 DIAGNOSIS — D688 Other specified coagulation defects: Secondary | ICD-10-CM | POA: Diagnosis not present

## 2022-04-22 DIAGNOSIS — Z992 Dependence on renal dialysis: Secondary | ICD-10-CM | POA: Diagnosis not present

## 2022-04-23 ENCOUNTER — Ambulatory Visit (HOSPITAL_COMMUNITY)
Admission: RE | Admit: 2022-04-23 | Discharge: 2022-04-23 | Disposition: A | Discharge status: Home / Self Care | Payer: Medicare Other | Source: Ambulatory Visit | Attending: Internal Medicine | Admitting: Internal Medicine

## 2022-04-23 DIAGNOSIS — M25561 Pain in right knee: Secondary | ICD-10-CM | POA: Insufficient documentation

## 2022-04-28 ENCOUNTER — Inpatient Hospital Stay: Payer: Medicare Other | Admitting: Physician Assistant

## 2022-04-28 DIAGNOSIS — D509 Iron deficiency anemia, unspecified: Secondary | ICD-10-CM | POA: Diagnosis not present

## 2022-04-28 DIAGNOSIS — N186 End stage renal disease: Secondary | ICD-10-CM | POA: Diagnosis not present

## 2022-04-28 DIAGNOSIS — D689 Coagulation defect, unspecified: Secondary | ICD-10-CM | POA: Diagnosis not present

## 2022-04-28 DIAGNOSIS — E1122 Type 2 diabetes mellitus with diabetic chronic kidney disease: Secondary | ICD-10-CM | POA: Diagnosis not present

## 2022-04-28 DIAGNOSIS — N2581 Secondary hyperparathyroidism of renal origin: Secondary | ICD-10-CM | POA: Diagnosis not present

## 2022-04-28 DIAGNOSIS — Z992 Dependence on renal dialysis: Secondary | ICD-10-CM | POA: Diagnosis not present

## 2022-04-28 NOTE — Progress Notes (Deleted)
Patient ID: Mark [name], male   DOB: [birth year], 71 y.o.   MRN: [id number]  ED visit 03/27/2022 Mark [name] is a 71 y.o. male.   Pt is a 71 yo male with a hx of ESRD on HD (Tue, Thu, and Sat), PAD s/p left BKA, CHF, DM, HTN, high cholesterol, seizures (while using drugs), cocaine abuse, GERD, and chronic anemia.  He was in [place] for several days for a funeral.  He went to the ED there on 7/19 and was told that he did not meet criteria for inpatient dialysis.  He did not try to go after that.  He went to his normal dialysis this am and received about half of the session.  He had an episode of AMS.  EMS was called and checked his BS.  It was 14 (but EMS said they've been having trouble with their monitors).  They gave him oral glucose twice and pt is now awake and alert.  He said he's hungry.  He did not eat breakfast this am.  He otherwise feels ok now.  He denies any sob.  No fevers.   This patient presents to the ED for concern of ams, this involves an extensive number of treatment options, and is a complaint that carries with it a high risk of complications and morbidity.  The differential diagnosis includes hypoglycemia, infection, tia     Co morbidities that complicate the patient evaluation   ESRD on HD (Tue, Thu, and Sat), PAD s/p left BKA, CHF, DM, HTN, high cholesterol, seizures (while using drugs), cocaine abuse, GERD, and chronic anemia     Additional history obtained:   Additional history obtained from epic chart review External records from outside source obtained and reviewed including EMS report     Lab Tests:   I Ordered, and personally interpreted labs.  The pertinent results include:  cbc with chronic anemia (hgb 10), cmp with bun 63 and cr 13.06 (chronic); k nl     Imaging Studies ordered:   I ordered imaging studies including CXR  I independently visualized and interpreted imaging which showed  IMPRESSION:  Mild cardiomegaly and pulmonary vascular congestion  without  significant interval change.          I agree with the radiologist interpretation     Cardiac Monitoring:   The patient was maintained on a cardiac monitor.  I personally viewed and interpreted the cardiac monitored which showed an underlying rhythm of: nsr     Medicines ordered and prescription drug management:   I ordered medication including food  for hypoglycemia  Reevaluation of the patient after these medicines showed that the patient improved I have reviewed the patients home medicines and have made adjustments as needed     Critical Interventions:   food   Problem List / ED Course:   AMS:  likely due to hypoglycemia as he got better after taking oral glucose.  He has eaten well here.  He has been normal mental status all day.  He is asking to go home.  However, his [employer] is at the dialysis center.  The social worker went to the center, but did not have a wheelchair lift on her car and was unable to bring it back.  She even called EMS to see if they could all lift it.  No go.  Pt does have a regular wheelchair at home.  I offered admission as a boarder until Monday, but he refuses strongly.  His family is  still out of town for the funeral.  He said he has friends that can help him.  We will have [facility] take him home.  He is to hold his insulin tonight.  He is to eat small, frequent meals.  He is to return if worse.  F/u with pcp.

## 2022-04-29 ENCOUNTER — Ambulatory Visit: Payer: Medicare Other | Admitting: Orthopedic Surgery

## 2022-04-29 DIAGNOSIS — E1129 Type 2 diabetes mellitus with other diabetic kidney complication: Secondary | ICD-10-CM | POA: Diagnosis not present

## 2022-04-29 DIAGNOSIS — Z992 Dependence on renal dialysis: Secondary | ICD-10-CM | POA: Diagnosis not present

## 2022-04-29 DIAGNOSIS — N186 End stage renal disease: Secondary | ICD-10-CM | POA: Diagnosis not present

## 2022-04-30 DIAGNOSIS — D509 Iron deficiency anemia, unspecified: Secondary | ICD-10-CM | POA: Diagnosis not present

## 2022-04-30 DIAGNOSIS — E1122 Type 2 diabetes mellitus with diabetic chronic kidney disease: Secondary | ICD-10-CM | POA: Diagnosis not present

## 2022-04-30 DIAGNOSIS — D689 Coagulation defect, unspecified: Secondary | ICD-10-CM | POA: Diagnosis not present

## 2022-04-30 DIAGNOSIS — D631 Anemia in chronic kidney disease: Secondary | ICD-10-CM | POA: Diagnosis not present

## 2022-04-30 DIAGNOSIS — N2581 Secondary hyperparathyroidism of renal origin: Secondary | ICD-10-CM | POA: Diagnosis not present

## 2022-04-30 DIAGNOSIS — N186 End stage renal disease: Secondary | ICD-10-CM | POA: Diagnosis not present

## 2022-04-30 DIAGNOSIS — Z992 Dependence on renal dialysis: Secondary | ICD-10-CM | POA: Diagnosis not present

## 2022-05-03 ENCOUNTER — Emergency Department (HOSPITAL_COMMUNITY): Payer: Medicare Other

## 2022-05-03 ENCOUNTER — Emergency Department (HOSPITAL_COMMUNITY)
Admission: EM | Admit: 2022-05-03 | Discharge: 2022-05-03 | Disposition: A | Discharge status: Home / Self Care | Payer: Medicare Other | Attending: Emergency Medicine | Admitting: Emergency Medicine

## 2022-05-03 ENCOUNTER — Encounter (HOSPITAL_COMMUNITY): Payer: Self-pay | Admitting: Emergency Medicine

## 2022-05-03 DIAGNOSIS — R739 Hyperglycemia, unspecified: Secondary | ICD-10-CM | POA: Diagnosis not present

## 2022-05-03 DIAGNOSIS — Z743 Need for continuous supervision: Secondary | ICD-10-CM | POA: Diagnosis not present

## 2022-05-03 DIAGNOSIS — M19012 Primary osteoarthritis, left shoulder: Secondary | ICD-10-CM | POA: Diagnosis not present

## 2022-05-03 DIAGNOSIS — E1122 Type 2 diabetes mellitus with diabetic chronic kidney disease: Secondary | ICD-10-CM | POA: Diagnosis not present

## 2022-05-03 DIAGNOSIS — I509 Heart failure, unspecified: Secondary | ICD-10-CM | POA: Diagnosis not present

## 2022-05-03 DIAGNOSIS — R6889 Other general symptoms and signs: Secondary | ICD-10-CM | POA: Diagnosis not present

## 2022-05-03 DIAGNOSIS — N186 End stage renal disease: Secondary | ICD-10-CM | POA: Diagnosis not present

## 2022-05-03 DIAGNOSIS — M542 Cervicalgia: Secondary | ICD-10-CM | POA: Diagnosis not present

## 2022-05-03 DIAGNOSIS — I132 Hypertensive heart and chronic kidney disease with heart failure and with stage 5 chronic kidney disease, or end stage renal disease: Secondary | ICD-10-CM | POA: Diagnosis not present

## 2022-05-03 DIAGNOSIS — M5010 Cervical disc disorder with radiculopathy, unspecified cervical region: Secondary | ICD-10-CM | POA: Insufficient documentation

## 2022-05-03 DIAGNOSIS — Z992 Dependence on renal dialysis: Secondary | ICD-10-CM | POA: Insufficient documentation

## 2022-05-03 LAB — COMPREHENSIVE METABOLIC PANEL
ALT: 8 U/L (ref 0–44)
AST: 10 U/L — ABNORMAL LOW (ref 15–41)
Albumin: 3.2 g/dL — ABNORMAL LOW (ref 3.5–5.0)
Alkaline Phosphatase: 80 U/L (ref 38–126)
Anion gap: 16 — ABNORMAL HIGH (ref 5–15)
BUN: 79 mg/dL — ABNORMAL HIGH (ref 8–23)
CO2: 23 mmol/L (ref 22–32)
Calcium: 9.5 mg/dL (ref 8.9–10.3)
Chloride: 100 mmol/L (ref 98–111)
Creatinine, Ser: 11.61 mg/dL — ABNORMAL HIGH (ref 0.61–1.24)
GFR, Estimated: 4 mL/min — ABNORMAL LOW (ref >60)
Glucose, Bld: 216 mg/dL — ABNORMAL HIGH (ref 70–99)
Potassium: 4.5 mmol/L (ref 3.5–5.1)
Sodium: 139 mmol/L (ref 135–145)
Total Bilirubin: 0.5 mg/dL (ref 0.3–1.2)
Total Protein: 7.6 g/dL (ref 6.5–8.1)

## 2022-05-03 LAB — CBC WITH DIFFERENTIAL/PLATELET
Abs Immature Granulocytes: 0.02 10*3/uL (ref 0.00–0.07)
Basophils Absolute: 0.1 10*3/uL (ref 0.0–0.1)
Basophils Relative: 1 %
Eosinophils Absolute: 0.4 10*3/uL (ref 0.0–0.5)
Eosinophils Relative: 4 %
HCT: 34.8 % — ABNORMAL LOW (ref 39.0–52.0)
Hemoglobin: 10.7 g/dL — ABNORMAL LOW (ref 13.0–17.0)
Immature Granulocytes: 0 %
Lymphocytes Relative: 41 %
Lymphs Abs: 4 10*3/uL (ref 0.7–4.0)
MCH: 29.6 pg (ref 26.0–34.0)
MCHC: 30.7 g/dL (ref 30.0–36.0)
MCV: 96.4 fL (ref 80.0–100.0)
Monocytes Absolute: 0.8 10*3/uL (ref 0.1–1.0)
Monocytes Relative: 9 %
Neutro Abs: 4.4 10*3/uL (ref 1.7–7.7)
Neutrophils Relative %: 45 %
Platelets: 331 10*3/uL (ref 150–400)
RBC: 3.61 MIL/uL — ABNORMAL LOW (ref 4.22–5.81)
RDW: 18.9 % — ABNORMAL HIGH (ref 11.5–15.5)
WBC: 9.6 10*3/uL (ref 4.0–10.5)
nRBC: 0 % (ref 0.0–0.2)

## 2022-05-03 MED ORDER — PREDNISONE 50 MG PO TABS: 50.0000 mg | ORAL_TABLET | Freq: Every day | ORAL | 0 refills | Status: DC

## 2022-05-03 MED ORDER — DEXAMETHASONE SODIUM PHOSPHATE 10 MG/ML IJ SOLN
10.0000 mg | Freq: Once | INTRAMUSCULAR | Status: AC
  Administered 2022-05-03: 10 mg via INTRAVENOUS
  Filled 2022-05-03: qty 1

## 2022-05-03 MED ORDER — HYDROCODONE-ACETAMINOPHEN 5-325 MG PO TABS: 1.0000 | ORAL_TABLET | ORAL | 0 refills | Status: DC | PRN

## 2022-05-03 MED ORDER — MORPHINE SULFATE (PF) 4 MG/ML IV SOLN
4.0000 mg | Freq: Once | INTRAVENOUS | Status: AC
  Administered 2022-05-03: 4 mg via INTRAVENOUS
  Filled 2022-05-03: qty 1

## 2022-05-03 MED ORDER — ONDANSETRON HCL 4 MG/2ML IJ SOLN
4.0000 mg | Freq: Once | INTRAMUSCULAR | Status: AC
Start: 2022-05-03 — End: 2022-05-03
  Administered 2022-05-03: 4 mg via INTRAVENOUS
  Filled 2022-05-03: qty 2

## 2022-05-03 NOTE — ED Triage Notes (Signed)
Pt BIB EMS from home, c/o neck and sharp left shoulder pain two weeks ago. Chronic knee pain. Wasn't able to get out of bed, refused to go to [facility]. Pt scheduled to have dialysis treatment today at 0500. Refused to consent d/t pain.   BP 186/96 P 85 RR 18 spO2 94%  CBG 276

## 2022-05-03 NOTE — Discharge Instructions (Addendum)
Call your dialysis center to see if they can get you in later today or tomorrow.  No emergent need for dialysis now.  The steroids may increase your blood sugar.  Please pay close attention to your blood sugar for the next few days.

## 2022-05-03 NOTE — ED Provider Notes (Signed)
[facility] DEPT Provider Note   CSN: [id number] Arrival date & time: 05/03/22  0730     History  Chief Complaint  Patient presents with   Shoulder Pain    Mark [name] is a 71 y.o. male.  Pt is a 71 yo male with pmhx significant for ESRD on HD (Tue, Thu, and Sat), PAD s/p left BKA, CHF, DM, HTN, high cholesterol, seizures (while using drugs), cocaine abuse, GERD, and chronic anemia.  Pt has had left sided neck and shoulder pain for the past month.  The pain was severe this am when he got up to go to dialysis.  He was not able to get up.  He did not want to go to dialysis and refused to go to [facility].  Pt was trying to wait until his appt tomorrow with Dr. Sharol Given.  He had an appt with him to discuss his bka and knee issues.  He was going to mention his neck and shoulder pain then.  Pt has numbness in both of his hands, but that is chronic.  It hurts to move his left shoulder.        Home Medications Prior to Admission medications   Medication Sig Start Date End Date Taking? Authorizing Provider  acetaminophen (TYLENOL) 650 MG CR tablet Take 1 tablet (650 mg total) by mouth 2 (two) times daily as needed for pain. Patient taking differently: Take 1,300 mg by mouth as needed for pain. 12/11/21  Yes [name], MD  atorvastatin (LIPITOR) 20 MG tablet Take 1 tablet (20 mg total) by mouth daily. 02/05/21  Yes [name], MD  carvedilol (COREG) 25 MG tablet Take 1 tablet (25 mg total) by mouth 2 (two) times daily. 02/02/22  Yes [name], MD  furosemide (LASIX) 80 MG tablet Take 1 tablet (80 mg total) by mouth daily. 02/02/22  Yes [name], MD  HYDROcodone-acetaminophen (NORCO/VICODIN) 5-325 MG tablet Take 1 tablet by mouth every 4 (four) hours as needed. 05/03/22  Yes Isla Pence, MD  insulin glargine (LANTUS SOLOSTAR) 100 UNIT/ML Solostar Pen Inject 20 Units into the skin at bedtime. 09/21/21  Yes [name], MD  lanthanum  (FOSRENOL) 1000 MG chewable tablet Chew 1,000 mg by mouth in the morning, at noon, in the evening, and at bedtime. 07/27/21  Yes [provider]  midodrine (PROAMATINE) 5 MG tablet Take 5 mg by mouth daily. Do not taking if BP is low. 04/04/21  Yes [provider]  Multiple Vitamin (MULTIVITAMIN WITH MINERALS) TABS tablet Take 1 tablet by mouth daily.   Yes [provider]  OVER THE COUNTER MEDICATION Take 1 tablet by mouth daily. Mega Man   Yes [provider]  predniSONE (DELTASONE) 50 MG tablet Take 1 tablet (50 mg total) by mouth daily with breakfast. 05/03/22  Yes Isla Pence, MD  VITAMIN D PO Take 1 capsule by mouth in the morning and at bedtime.   Yes [provider]  Blood Glucose Monitoring Suppl (ONETOUCH VERIO) w/Device KIT Use as directed to test blood sugar three times daily. 03/23/19   [name], MD  glucose blood (ONETOUCH VERIO) test strip USE AS DIRECTED THREE TIMES DAILY TO  TEST  BLOOD  SUGAR 03/15/21   [name], MD  Insulin Pen Needle (TRUEPLUS 5-BEVEL PEN NEEDLES) 32G X 4 MM MISC Use to administer Lantus once daily. 02/19/21   [name], MD  Insulin Syringe-Needle U-100 (INSULIN SYRINGE .5CC/30GX5/16")  30G X 5/16" 0.5 ML MISC Check blood sugar TID & QHS 10/30/14   Lorayne Marek, MD  OneTouch Delica Lancets 34L MISC Use as directed to test blood sugar three times daily. 06/17/21   Charlott Rakes, MD      Allergies    Oxycodone    Review of Systems   Review of Systems  Musculoskeletal:        Left shoulder, neck pain  All other systems reviewed and are negative.   Physical Exam Updated Vital Signs BP (!) 168/85   Pulse 83   Temp 98.1 F (36.7 C) (Oral)   SpO2 97%  Physical Exam Vitals and nursing note reviewed.  Constitutional:      Appearance: Normal appearance.  HENT:     Head: Normocephalic and atraumatic.     Right Ear: External ear normal.     Left Ear: External ear normal.     Nose:  Nose normal.     Mouth/Throat:     Mouth: Mucous membranes are moist.     Pharynx: Oropharynx is clear.  Eyes:     Extraocular Movements: Extraocular movements intact.     Conjunctiva/sclera: Conjunctivae normal.     Pupils: Pupils are equal, round, and reactive to light.  Neck:   Cardiovascular:     Rate and Rhythm: Normal rate and regular rhythm.     Pulses: Normal pulses.     Heart sounds: Normal heart sounds.  Pulmonary:     Effort: Pulmonary effort is normal.     Breath sounds: Normal breath sounds.  Abdominal:     General: Abdomen is flat. Bowel sounds are normal.     Palpations: Abdomen is soft.  Musculoskeletal:     Left shoulder: Tenderness present. Decreased range of motion.     Cervical back: Normal range of motion and neck supple.     Comments: LUE AVF + thrill L BKA  Skin:    General: Skin is warm.     Capillary Refill: Capillary refill takes less than 2 seconds.  Neurological:     General: No focal deficit present.     Mental Status: He is alert and oriented to person, place, and time.  Psychiatric:        Mood and Affect: Mood normal.        Behavior: Behavior normal.     ED Results / Procedures / Treatments   Labs (all labs ordered are listed, but only abnormal results are displayed) Labs Reviewed  COMPREHENSIVE METABOLIC PANEL - Abnormal; Notable for the following components:      Result Value   Glucose, Bld 216 (*)    BUN 79 (*)    Creatinine, Ser 11.61 (*)    Albumin 3.2 (*)    AST 10 (*)    GFR, Estimated 4 (*)    Anion gap 16 (*)    All other components within normal limits  CBC WITH DIFFERENTIAL/PLATELET - Abnormal; Notable for the following components:   RBC 3.61 (*)    Hemoglobin 10.7 (*)    HCT 34.8 (*)    RDW 18.9 (*)    All other components within normal limits    EKG None  Radiology DG Shoulder Left  Result Date: 05/03/2022 CLINICAL DATA:  Left shoulder pain for 2 weeks without known injury. EXAM: LEFT SHOULDER - 2+ VIEW  COMPARISON:  None Available. FINDINGS: There is no evidence of fracture or dislocation. Mild degenerative changes seen involving the left acromioclavicular joint. Soft tissues are  unremarkable. IMPRESSION: Mild degenerative joint disease of the left acromioclavicular joint. No acute abnormality seen. Electronically Signed   By: Marijo Conception M.D.   On: 05/03/2022 09:07   DG Cervical Spine Complete  Result Date: 05/03/2022 CLINICAL DATA:  Neck pain for 2 weeks without known injury. EXAM: CERVICAL SPINE - COMPLETE 4+ VIEW COMPARISON:  November 22, 2016. FINDINGS: There is no evidence of cervical spine fracture or prevertebral soft tissue swelling. Alignment is normal. Moderate degenerative disc disease is noted at C3-4, C4-5, C5-6, C6-7 C7-T1 with anterior osteophyte formation. IMPRESSION: Moderate multilevel degenerative disc disease. No acute abnormality seen. Electronically Signed   By: Marijo Conception M.D.   On: 05/03/2022 09:05   DG Chest 2 View  Result Date: 05/03/2022 CLINICAL DATA:  Dialysis. EXAM: CHEST - 2 VIEW COMPARISON:  March 27, 2022. FINDINGS: Stable cardiomediastinal silhouette. Both lungs are clear. The visualized skeletal structures are unremarkable. IMPRESSION: No active cardiopulmonary disease. Electronically Signed   By: Marijo Conception M.D.   On: 05/03/2022 09:03    Procedures Procedures    Medications Ordered in ED Medications  dexamethasone (DECADRON) injection 10 mg (has no administration in time range)  morphine (PF) 4 MG/ML injection 4 mg (4 mg Intravenous Given 05/03/22 0859)  ondansetron (ZOFRAN) injection 4 mg (4 mg Intravenous Given 05/03/22 0859)    ED Course/ Medical Decision Making/ A&P                           Medical Decision Making Amount and/or Complexity of Data Reviewed Labs: ordered. Radiology: ordered.  Risk Prescription drug management.   This patient presents to the ED for concern of neck/shoulder pain, this involves an extensive number of  treatment options, and is a complaint that carries with it a high risk of complications and morbidity.  The differential diagnosis includes msk, cervical radiculopathy   Co morbidities that complicate the patient evaluation    ESRD on HD (Tue, Thu, and Sat), PAD s/p left BKA, CHF, DM, HTN, high cholesterol, seizures (while using drugs), cocaine abuse, GERD, and chronic anemia.    Additional history obtained:  Additional history obtained from epic chart review External records from outside source obtained and reviewed including EMS report   Lab Tests:  I Ordered, and personally interpreted labs.  The pertinent results include:  cmp with nl k, glucose 216, bun 79, and cr 11.61 (all chronic elevations); cbc with hgb 10.7 (chronic anemia)   Imaging Studies ordered:  I ordered imaging studies including left shoulder, cxr, cervical spine  I independently visualized and interpreted imaging which showed  CXR: IMPRESSION:  No active cardiopulmonary disease.  Cervical spine: IMPRESSION:  Moderate multilevel degenerative disc disease. No acute abnormality  seen.  L shoulder: IMPRESSION:  Mild degenerative joint disease of the left acromioclavicular joint.  No acute abnormality seen.   I agree with the radiologist interpretation   Cardiac Monitoring:  The patient was maintained on a cardiac monitor.  I personally viewed and interpreted the cardiac monitored which showed an underlying rhythm of: nsr   Medicines ordered and prescription drug management:  I ordered medication including morphine  for pain  Reevaluation of the patient after these medicines showed that the patient improved I have reviewed the patients home medicines and have made adjustments as needed   Critical Interventions:  Pain control  Problem List / ED Course:  ESRD:  no emergent need for dialysis today.  Pt can  call his dialysis center to try to reschedule for today or tomorrow. Neck and shoulder pain:   likely due to cervical radiculopathy.  Pt will be given a short course of steroids.  He is warned that the steroids will increase his bs.  Pt is stable for d/c.  He is to keep his appt with Dr. Sharol Given for tomorrow.  Return if worse.    Reevaluation:  After the interventions noted above, I reevaluated the patient and found that they have :improved   Social Determinants of Health:  Lives at home   Dispostion:  After consideration of the diagnostic results and the patients response to treatment, I feel that the patent would benefit from discharge with outpatient f/u.          Final Clinical Impression(s) / ED Diagnoses Final diagnoses:  Cervical disc disorder with radiculopathy of cervical region  ESRD on hemodialysis [facility])    Rx / DC Orders ED Discharge Orders          Ordered    HYDROcodone-acetaminophen (NORCO/VICODIN) 5-325 MG tablet  Every 4 hours PRN        05/03/22 0927    predniSONE (DELTASONE) 50 MG tablet  Daily with breakfast        05/03/22 0931              Isla Pence, MD 05/03/22 [id number]

## 2022-05-04 ENCOUNTER — Ambulatory Visit (INDEPENDENT_AMBULATORY_CARE_PROVIDER_SITE_OTHER): Payer: Medicare Other | Admitting: Orthopedic Surgery

## 2022-05-04 ENCOUNTER — Encounter: Payer: Self-pay | Admitting: Orthopedic Surgery

## 2022-05-04 DIAGNOSIS — L84 Corns and callosities: Secondary | ICD-10-CM | POA: Diagnosis not present

## 2022-05-04 DIAGNOSIS — M1711 Unilateral primary osteoarthritis, right knee: Secondary | ICD-10-CM

## 2022-05-04 DIAGNOSIS — E1351 Other specified diabetes mellitus with diabetic peripheral angiopathy without gangrene: Secondary | ICD-10-CM | POA: Diagnosis not present

## 2022-05-04 DIAGNOSIS — B351 Tinea unguium: Secondary | ICD-10-CM | POA: Diagnosis not present

## 2022-05-04 MED ORDER — METHYLPREDNISOLONE ACETATE 40 MG/ML IJ SUSP
40.0000 mg | INTRAMUSCULAR | Status: AC | PRN
  Administered 2022-05-04: 40 mg via INTRA_ARTICULAR

## 2022-05-04 MED ORDER — LIDOCAINE HCL (PF) 1 % IJ SOLN
5.0000 mL | INTRAMUSCULAR | Status: AC | PRN
  Administered 2022-05-04: 5 mL
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
## 2022-05-05 DIAGNOSIS — D509 Iron deficiency anemia, unspecified: Secondary | ICD-10-CM | POA: Diagnosis not present

## 2022-05-05 DIAGNOSIS — N2581 Secondary hyperparathyroidism of renal origin: Secondary | ICD-10-CM | POA: Diagnosis not present

## 2022-05-05 DIAGNOSIS — Z992 Dependence on renal dialysis: Secondary | ICD-10-CM | POA: Diagnosis not present

## 2022-05-05 DIAGNOSIS — D631 Anemia in chronic kidney disease: Secondary | ICD-10-CM | POA: Diagnosis not present

## 2022-05-05 DIAGNOSIS — D689 Coagulation defect, unspecified: Secondary | ICD-10-CM | POA: Diagnosis not present

## 2022-05-05 DIAGNOSIS — E1122 Type 2 diabetes mellitus with diabetic chronic kidney disease: Secondary | ICD-10-CM | POA: Diagnosis not present

## 2022-05-05 DIAGNOSIS — N186 End stage renal disease: Secondary | ICD-10-CM | POA: Diagnosis not present

## 2022-05-07 DIAGNOSIS — D509 Iron deficiency anemia, unspecified: Secondary | ICD-10-CM | POA: Diagnosis not present

## 2022-05-07 DIAGNOSIS — N186 End stage renal disease: Secondary | ICD-10-CM | POA: Diagnosis not present

## 2022-05-07 DIAGNOSIS — E1122 Type 2 diabetes mellitus with diabetic chronic kidney disease: Secondary | ICD-10-CM | POA: Diagnosis not present

## 2022-05-07 DIAGNOSIS — N2581 Secondary hyperparathyroidism of renal origin: Secondary | ICD-10-CM | POA: Diagnosis not present

## 2022-05-07 DIAGNOSIS — D689 Coagulation defect, unspecified: Secondary | ICD-10-CM | POA: Diagnosis not present

## 2022-05-07 DIAGNOSIS — Z992 Dependence on renal dialysis: Secondary | ICD-10-CM | POA: Diagnosis not present

## 2022-05-07 DIAGNOSIS — D631 Anemia in chronic kidney disease: Secondary | ICD-10-CM | POA: Diagnosis not present

## 2022-05-10 DIAGNOSIS — N186 End stage renal disease: Secondary | ICD-10-CM | POA: Diagnosis not present

## 2022-05-10 DIAGNOSIS — Z992 Dependence on renal dialysis: Secondary | ICD-10-CM | POA: Diagnosis not present

## 2022-05-10 DIAGNOSIS — D631 Anemia in chronic kidney disease: Secondary | ICD-10-CM | POA: Diagnosis not present

## 2022-05-10 DIAGNOSIS — E1122 Type 2 diabetes mellitus with diabetic chronic kidney disease: Secondary | ICD-10-CM | POA: Diagnosis not present

## 2022-05-10 DIAGNOSIS — N2581 Secondary hyperparathyroidism of renal origin: Secondary | ICD-10-CM | POA: Diagnosis not present

## 2022-05-10 DIAGNOSIS — D509 Iron deficiency anemia, unspecified: Secondary | ICD-10-CM | POA: Diagnosis not present

## 2022-05-10 DIAGNOSIS — D689 Coagulation defect, unspecified: Secondary | ICD-10-CM | POA: Diagnosis not present

## 2022-05-12 ENCOUNTER — Other Ambulatory Visit: Payer: Self-pay

## 2022-05-12 ENCOUNTER — Emergency Department (HOSPITAL_COMMUNITY): Payer: Medicare Other

## 2022-05-12 ENCOUNTER — Encounter (HOSPITAL_COMMUNITY): Payer: Self-pay

## 2022-05-12 ENCOUNTER — Observation Stay (HOSPITAL_COMMUNITY)
Admission: EM | Admit: 2022-05-12 | Discharge: 2022-05-14 | Discharge status: Against Medical Advice | Payer: Medicare Other | Attending: Family Medicine | Admitting: Family Medicine

## 2022-05-12 DIAGNOSIS — Z87891 Personal history of nicotine dependence: Secondary | ICD-10-CM | POA: Diagnosis not present

## 2022-05-12 DIAGNOSIS — E1151 Type 2 diabetes mellitus with diabetic peripheral angiopathy without gangrene: Secondary | ICD-10-CM | POA: Diagnosis not present

## 2022-05-12 DIAGNOSIS — D638 Anemia in other chronic diseases classified elsewhere: Secondary | ICD-10-CM | POA: Diagnosis present

## 2022-05-12 DIAGNOSIS — K625 Hemorrhage of anus and rectum: Secondary | ICD-10-CM | POA: Diagnosis not present

## 2022-05-12 DIAGNOSIS — N186 End stage renal disease: Secondary | ICD-10-CM

## 2022-05-12 DIAGNOSIS — Z794 Long term (current) use of insulin: Secondary | ICD-10-CM | POA: Diagnosis not present

## 2022-05-12 DIAGNOSIS — Z992 Dependence on renal dialysis: Secondary | ICD-10-CM | POA: Diagnosis not present

## 2022-05-12 DIAGNOSIS — K922 Gastrointestinal hemorrhage, unspecified: Secondary | ICD-10-CM | POA: Diagnosis not present

## 2022-05-12 DIAGNOSIS — G40909 Epilepsy, unspecified, not intractable, without status epilepticus: Secondary | ICD-10-CM

## 2022-05-12 DIAGNOSIS — I132 Hypertensive heart and chronic kidney disease with heart failure and with stage 5 chronic kidney disease, or end stage renal disease: Secondary | ICD-10-CM | POA: Diagnosis not present

## 2022-05-12 DIAGNOSIS — I10 Essential (primary) hypertension: Secondary | ICD-10-CM | POA: Diagnosis not present

## 2022-05-12 DIAGNOSIS — E1122 Type 2 diabetes mellitus with diabetic chronic kidney disease: Secondary | ICD-10-CM | POA: Diagnosis present

## 2022-05-12 DIAGNOSIS — I739 Peripheral vascular disease, unspecified: Secondary | ICD-10-CM | POA: Diagnosis present

## 2022-05-12 DIAGNOSIS — D631 Anemia in chronic kidney disease: Secondary | ICD-10-CM | POA: Diagnosis not present

## 2022-05-12 DIAGNOSIS — Z79899 Other long term (current) drug therapy: Secondary | ICD-10-CM | POA: Diagnosis not present

## 2022-05-12 DIAGNOSIS — I5032 Chronic diastolic (congestive) heart failure: Secondary | ICD-10-CM | POA: Diagnosis not present

## 2022-05-12 DIAGNOSIS — K449 Diaphragmatic hernia without obstruction or gangrene: Secondary | ICD-10-CM | POA: Diagnosis not present

## 2022-05-12 LAB — CBC WITH DIFFERENTIAL/PLATELET
Abs Immature Granulocytes: 0.04 10*3/uL (ref 0.00–0.07)
Basophils Absolute: 0.1 10*3/uL (ref 0.0–0.1)
Basophils Relative: 1 %
Eosinophils Absolute: 0.2 10*3/uL (ref 0.0–0.5)
Eosinophils Relative: 2 %
HCT: 34.6 % — ABNORMAL LOW (ref 39.0–52.0)
Hemoglobin: 11.6 g/dL — ABNORMAL LOW (ref 13.0–17.0)
Immature Granulocytes: 1 %
Lymphocytes Relative: 29 %
Lymphs Abs: 2.4 10*3/uL (ref 0.7–4.0)
MCH: 29.7 pg (ref 26.0–34.0)
MCHC: 33.5 g/dL (ref 30.0–36.0)
MCV: 88.7 fL (ref 80.0–100.0)
Monocytes Absolute: 0.9 10*3/uL (ref 0.1–1.0)
Monocytes Relative: 11 %
Neutro Abs: 5 10*3/uL (ref 1.7–7.7)
Neutrophils Relative %: 56 %
Platelets: 386 10*3/uL (ref 150–400)
RBC: 3.9 MIL/uL — ABNORMAL LOW (ref 4.22–5.81)
RDW: 18.1 % — ABNORMAL HIGH (ref 11.5–15.5)
WBC: 8.5 10*3/uL (ref 4.0–10.5)
nRBC: 0 % (ref 0.0–0.2)

## 2022-05-12 LAB — HEMOGLOBIN A1C
Hgb A1c MFr Bld: 7.4 % — ABNORMAL HIGH (ref 4.8–5.6)
Mean Plasma Glucose: 165.68 mg/dL

## 2022-05-12 LAB — COMPREHENSIVE METABOLIC PANEL
ALT: 8 U/L (ref 0–44)
AST: 8 U/L — ABNORMAL LOW (ref 15–41)
Albumin: 3.1 g/dL — ABNORMAL LOW (ref 3.5–5.0)
Alkaline Phosphatase: 71 U/L (ref 38–126)
Anion gap: 14 (ref 5–15)
BUN: 65 mg/dL — ABNORMAL HIGH (ref 8–23)
CO2: 26 mmol/L (ref 22–32)
Calcium: 9.5 mg/dL (ref 8.9–10.3)
Chloride: 99 mmol/L (ref 98–111)
Creatinine, Ser: 11.42 mg/dL — ABNORMAL HIGH (ref 0.61–1.24)
GFR, Estimated: 4 mL/min — ABNORMAL LOW (ref >60)
Glucose, Bld: 213 mg/dL — ABNORMAL HIGH (ref 70–99)
Potassium: 3.5 mmol/L (ref 3.5–5.1)
Sodium: 139 mmol/L (ref 135–145)
Total Bilirubin: 0.5 mg/dL (ref 0.3–1.2)
Total Protein: 7.3 g/dL (ref 6.5–8.1)

## 2022-05-12 LAB — CBC
HCT: 35.6 % — ABNORMAL LOW (ref 39.0–52.0)
Hemoglobin: 11.7 g/dL — ABNORMAL LOW (ref 13.0–17.0)
MCH: 29.9 pg (ref 26.0–34.0)
MCHC: 32.9 g/dL (ref 30.0–36.0)
MCV: 91 fL (ref 80.0–100.0)
Platelets: 405 10*3/uL — ABNORMAL HIGH (ref 150–400)
RBC: 3.91 MIL/uL — ABNORMAL LOW (ref 4.22–5.81)
RDW: 18.9 % — ABNORMAL HIGH (ref 11.5–15.5)
WBC: 8 10*3/uL (ref 4.0–10.5)
nRBC: 0 % (ref 0.0–0.2)

## 2022-05-12 LAB — I-STAT CHEM 8, ED
BUN: 61 mg/dL — ABNORMAL HIGH (ref 8–23)
Calcium, Ion: 1.16 mmol/L (ref 1.15–1.40)
Chloride: 101 mmol/L (ref 98–111)
Creatinine, Ser: 11.5 mg/dL — ABNORMAL HIGH (ref 0.61–1.24)
Glucose, Bld: 212 mg/dL — ABNORMAL HIGH (ref 70–99)
HCT: 39 % (ref 39.0–52.0)
Hemoglobin: 13.3 g/dL (ref 13.0–17.0)
Potassium: 3.6 mmol/L (ref 3.5–5.1)
Sodium: 139 mmol/L (ref 135–145)
TCO2: 28 mmol/L (ref 22–32)

## 2022-05-12 LAB — CBG MONITORING, ED: Glucose-Capillary: 252 mg/dL — ABNORMAL HIGH (ref 70–99)

## 2022-05-12 LAB — PROTIME-INR
INR: 1 (ref 0.8–1.2)
Prothrombin Time: 13.4 seconds (ref 11.4–15.2)

## 2022-05-12 LAB — POC OCCULT BLOOD, ED: Fecal Occult Bld: POSITIVE — AB

## 2022-05-12 MED ORDER — INSULIN ASPART 100 UNIT/ML IJ SOLN
0.0000 [IU] | Freq: Three times a day (TID) | INTRAMUSCULAR | Status: DC
  Administered 2022-05-13: 1 [IU] via SUBCUTANEOUS

## 2022-05-12 MED ORDER — HYDROMORPHONE HCL 1 MG/ML IJ SOLN
0.5000 mg | INTRAMUSCULAR | Status: DC | PRN
  Administered 2022-05-12 – 2022-05-13 (×3): 0.5 mg via INTRAVENOUS
  Filled 2022-05-12: qty 0.5
  Filled 2022-05-12: qty 1
  Filled 2022-05-12: qty 0.5

## 2022-05-12 MED ORDER — FUROSEMIDE 40 MG PO TABS
80.0000 mg | ORAL_TABLET | Freq: Every day | ORAL | Status: DC
  Administered 2022-05-13: 80 mg via ORAL
  Filled 2022-05-12: qty 2

## 2022-05-12 MED ORDER — INSULIN GLARGINE-YFGN 100 UNIT/ML ~~LOC~~ SOLN
15.0000 [IU] | Freq: Every day | SUBCUTANEOUS | Status: DC
  Filled 2022-05-12: qty 0.15

## 2022-05-12 MED ORDER — LANTHANUM CARBONATE 500 MG PO CHEW
1000.0000 mg | CHEWABLE_TABLET | Freq: Three times a day (TID) | ORAL | Status: DC
  Administered 2022-05-13 (×3): 1000 mg via ORAL
  Filled 2022-05-12 (×4): qty 2

## 2022-05-12 MED ORDER — ACETAMINOPHEN 325 MG PO TABS: 650.0000 mg | ORAL_TABLET | Freq: Four times a day (QID) | ORAL | Status: DC | PRN

## 2022-05-12 MED ORDER — SODIUM CHLORIDE 0.9 % IV SOLN: 250.0000 mL | INTRAVENOUS | Status: DC | PRN

## 2022-05-12 MED ORDER — SODIUM CHLORIDE 0.9% FLUSH
3.0000 mL | Freq: Two times a day (BID) | INTRAVENOUS | Status: DC
  Administered 2022-05-12 – 2022-05-13 (×3): 3 mL via INTRAVENOUS

## 2022-05-12 MED ORDER — ACETAMINOPHEN 650 MG RE SUPP: 650.0000 mg | Freq: Four times a day (QID) | RECTAL | Status: DC | PRN

## 2022-05-12 MED ORDER — ONDANSETRON HCL 4 MG/2ML IJ SOLN
4.0000 mg | Freq: Once | INTRAMUSCULAR | Status: AC
  Administered 2022-05-12: 4 mg via INTRAVENOUS
  Filled 2022-05-12: qty 2

## 2022-05-12 MED ORDER — SODIUM CHLORIDE 0.9% FLUSH: 3.0000 mL | INTRAVENOUS | Status: DC | PRN

## 2022-05-12 MED ORDER — FENTANYL CITRATE PF 50 MCG/ML IJ SOSY
50.0000 ug | PREFILLED_SYRINGE | Freq: Once | INTRAMUSCULAR | Status: AC
  Administered 2022-05-12: 50 ug via INTRAVENOUS
  Filled 2022-05-12 (×2): qty 1

## 2022-05-12 MED ORDER — IOHEXOL 350 MG/ML SOLN
100.0000 mL | Freq: Once | INTRAVENOUS | Status: AC | PRN
Start: 2022-05-12 — End: 2022-05-12
  Administered 2022-05-12: 100 mL via INTRAVENOUS

## 2022-05-12 MED ORDER — ATORVASTATIN CALCIUM 10 MG PO TABS
20.0000 mg | ORAL_TABLET | Freq: Every day | ORAL | Status: DC
  Administered 2022-05-13: 20 mg via ORAL
  Filled 2022-05-12: qty 2

## 2022-05-12 MED ORDER — INSULIN GLARGINE-YFGN 100 UNIT/ML ~~LOC~~ SOLN
8.0000 [IU] | Freq: Every day | SUBCUTANEOUS | Status: DC
  Administered 2022-05-12 – 2022-05-13 (×2): 8 [IU] via SUBCUTANEOUS
  Filled 2022-05-12 (×4): qty 0.08

## 2022-05-12 MED ORDER — CARVEDILOL 25 MG PO TABS
25.0000 mg | ORAL_TABLET | Freq: Two times a day (BID) | ORAL | Status: DC
  Administered 2022-05-13 (×2): 25 mg via ORAL
  Filled 2022-05-12 (×2): qty 1
  Filled 2022-05-12: qty 2

## 2022-05-12 MED ORDER — MIDODRINE HCL 5 MG PO TABS: 5.0000 mg | ORAL_TABLET | Freq: Every day | ORAL | Status: DC | PRN

## 2022-05-12 NOTE — ED Provider Triage Note (Signed)
Emergency Medicine Provider Triage Evaluation Note  Mark [name] , a 71 y.o. male  was evaluated in triage.  Pt complains of rectal bleeding.  Patient has end-stage renal disease and goes to dialysis Monday Wednesday Friday.  He last dialyzed this past Monday 2 days ago.  He was supposed to go today.  He complains of frequent bowel movements consisting of mucus and blood beginning after dialysis this past Friday.  He has had multiple rounds of the same.  He denies any abdominal pain.  Review of Systems  Positive: Rectal bleeding Negative: Fever  Physical Exam  BP 129/66 (BP Location: Right Arm) Comment (BP Location): LA used for dialysis  Pulse 70   Temp 98.6 F (37 C) (Oral)   Resp 19   Ht '6\' 2"'$  (1.88 m)   Wt 110.2 kg   SpO2 99%   BMI 31.20 kg/m  Gen:   Awake, no distress    Resp:  Normal effort   MSK:   Moves extremities without difficulty   Other:   No abdominal tenderness  Medical Decision Making  Medically screening exam initiated at 11:38 AM.  Appropriate orders placed.  Mark [name] was informed that the remainder of the evaluation will be completed by another provider, this initial triage assessment does not replace that evaluation, and the importance of remaining in the ED until their evaluation is complete.  Work-up initiated   Margarita Mail, PA-C 05/12/22 1139

## 2022-05-12 NOTE — ED Triage Notes (Addendum)
Reports having blood in stool since last week and it was like gel on tissue when he wipes. Dialysis patient and chose to come here instead of going to his dialysis treatment today

## 2022-05-12 NOTE — Assessment & Plan Note (Addendum)
70 year old male presenting with 5 day history of abdominal pain with BRBPR+ diarrhea  -obs to progressive -having one bloody BM/day and none while in ED -fecal occult positive, but no gross blood with BM -CTA abdomen/pelvis: negative for evidence of GI bleeding. 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 -trend cbc q 6 hours -hold VTE prophylaxis -hgb stable, okay with liquid diet  -consult GI if hgb trends downward or continues to have bloody BM -last cscope in 2018 with diverticula and polyps. (unsure path results)

## 2022-05-12 NOTE — ED Notes (Signed)
Patient up at lib and has pulled off all monitoring equipment and at the bedside eating his meal. Agrees to sit back and bed and have continuous monitoring when done with meal

## 2022-05-12 NOTE — Assessment & Plan Note (Addendum)
Euvolemic Volume control per dialysis Echo: 11/22: EF of 60-65% with normal LVF. Grade 1 DD.  Continue medical management with lasix, coreg

## 2022-05-12 NOTE — Assessment & Plan Note (Signed)
S/p left BKA Continue statin

## 2022-05-12 NOTE — ED Notes (Signed)
Pt returned from CT °

## 2022-05-12 NOTE — ED Provider Notes (Signed)
[facility] EMERGENCY DEPARTMENT Provider Note   CSN: [id number] Arrival date & time: 05/12/22  1102     History  Chief Complaint  Patient presents with   Rectal Bleeding    Mark [name] is a 71 y.o. male with a past medical history of end-stage renal disease on hemodialysis Monday Wednesday Friday with last dialysis 2 days ago, due today, history of seizure disorder, CHF, type 2 diabetes, MGUS/multiple myeloma, metabolic bone disease, hypertension who presents emergency department today with complaint of bloody stools.  He had onset of what he describes as "blood and gel" stools beginning after dialysis on Friday.  He states that every time he eats he has a bowel movement that is the same.  He denies any abdominal pain or vomiting. He denies any history of the same.  Rectal Bleeding      Home Medications Prior to Admission medications   Medication Sig Start Date End Date Taking? Authorizing Provider  acetaminophen (TYLENOL) 650 MG CR tablet Take 1 tablet (650 mg total) by mouth 2 (two) times daily as needed for pain. Patient taking differently: Take 1,300 mg by mouth as needed for pain. 12/11/21  Yes [name], MD  atorvastatin (LIPITOR) 20 MG tablet Take 1 tablet (20 mg total) by mouth daily. 02/05/21  Yes [name], MD  carvedilol (COREG) 25 MG tablet Take 1 tablet (25 mg total) by mouth 2 (two) times daily. 02/02/22  Yes [name], MD  furosemide (LASIX) 80 MG tablet Take 1 tablet (80 mg total) by mouth daily. 02/02/22  Yes [name], MD  insulin glargine (LANTUS SOLOSTAR) 100 UNIT/ML Solostar Pen Inject 20 Units into the skin at bedtime. 09/21/21  Yes [name], MD  lanthanum (FOSRENOL) 1000 MG chewable tablet Chew 1,000 mg by mouth in the morning, at noon, in the evening, and at bedtime. 07/27/21  Yes [provider]  midodrine (PROAMATINE) 5 MG tablet Take 5 mg by mouth daily. Do not taking if BP is low.  04/04/21  Yes [provider]  Multiple Vitamin (MULTIVITAMIN WITH MINERALS) TABS tablet Take 1 tablet by mouth daily.   Yes [provider]  OVER THE COUNTER MEDICATION Take 1 tablet by mouth daily. Mega Man   Yes [provider]  VITAMIN D PO Take 1 capsule by mouth in the morning and at bedtime.   Yes [provider]  Blood Glucose Monitoring Suppl (ONETOUCH VERIO) w/Device KIT Use as directed to test blood sugar three times daily. 03/23/19   [name], MD  glucose blood (ONETOUCH VERIO) test strip USE AS DIRECTED THREE TIMES DAILY TO  TEST  BLOOD  SUGAR 03/15/21   [name], MD  HYDROcodone-acetaminophen (NORCO/VICODIN) 5-325 MG tablet Take 1 tablet by mouth every 4 (four) hours as needed. Patient not taking: Reported on 05/12/2022 05/03/22   Isla Pence, MD  Insulin Pen Needle (TRUEPLUS 5-BEVEL PEN NEEDLES) 32G X 4 MM MISC Use to administer Lantus once daily. 02/19/21   [name], MD  Insulin Syringe-Needle U-100 (INSULIN SYRINGE .5CC/30GX5/16") 30G X 5/16" 0.5 ML MISC Check blood sugar TID & QHS 10/30/14   Advani, Vernon Prey, MD  OneTouch Delica Lancets 41O MISC Use as directed to test blood sugar three times daily. 06/17/21   Charlott Rakes, MD      Allergies    Oxycodone    Review of Systems   Review of Systems  Gastrointestinal:  Positive for hematochezia.  Physical Exam Updated Vital Signs BP 130/74 (BP Location: Right Arm)   Pulse 67   Temp 97.6 F (36.4 C) (Oral)   Resp 14   Ht 6' 2"  (1.88 m)   Wt 97.3 kg Comment: bed weight - 2.9kg for prosthetic  SpO2 93%   BMI 27.54 kg/m  Physical Exam Vitals and nursing note reviewed.  Constitutional:      General: He is not in acute distress.    Appearance: He is well-developed. He is not diaphoretic.  HENT:     Head: Normocephalic and atraumatic.  Eyes:     General: No scleral icterus.    Conjunctiva/sclera: Conjunctivae normal.  Cardiovascular:     Rate and Rhythm:  Normal rate and regular rhythm.     Heart sounds: Normal heart sounds.  Pulmonary:     Effort: Pulmonary effort is normal. No respiratory distress.     Breath sounds: Normal breath sounds.  Abdominal:     Palpations: Abdomen is soft.     Tenderness: There is no abdominal tenderness.  Musculoskeletal:     Cervical back: Normal range of motion and neck supple.  Skin:    General: Skin is warm and dry.  Neurological:     Mental Status: He is alert.  Psychiatric:        Behavior: Behavior normal.     ED Results / Procedures / Treatments   Labs (all labs ordered are listed, but only abnormal results are displayed) Labs Reviewed  CBC WITH DIFFERENTIAL/PLATELET - Abnormal; Notable for the following components:      Result Value   RBC 3.90 (*)    Hemoglobin 11.6 (*)    HCT 34.6 (*)    RDW 18.1 (*)    All other components within normal limits  COMPREHENSIVE METABOLIC PANEL - Abnormal; Notable for the following components:   Glucose, Bld 213 (*)    BUN 65 (*)    Creatinine, Ser 11.42 (*)    Albumin 3.1 (*)    AST 8 (*)    GFR, Estimated 4 (*)    All other components within normal limits  CBC - Abnormal; Notable for the following components:   RBC 3.91 (*)    Hemoglobin 11.7 (*)    HCT 35.6 (*)    RDW 18.9 (*)    Platelets 405 (*)    All other components within normal limits  CBC - Abnormal; Notable for the following components:   RBC 4.08 (*)    Hemoglobin 12.0 (*)    HCT 36.2 (*)    RDW 17.8 (*)    All other components within normal limits  CBC - Abnormal; Notable for the following components:   Hemoglobin 12.1 (*)    HCT 37.4 (*)    RDW 18.1 (*)    All other components within normal limits  HEMOGLOBIN A1C - Abnormal; Notable for the following components:   Hgb A1c MFr Bld 7.4 (*)    All other components within normal limits  BASIC METABOLIC PANEL - Abnormal; Notable for the following components:   Glucose, Bld 136 (*)    BUN 73 (*)    Creatinine, Ser 12.86 (*)     GFR, Estimated 4 (*)    Anion gap 18 (*)    All other components within normal limits  GLUCOSE, CAPILLARY - Abnormal; Notable for the following components:   Glucose-Capillary 124 (*)    All other components within normal limits  HEPATITIS B SURFACE ANTIBODY,QUALITATIVE - Abnormal; Notable for  the following components:   Hep B S Ab Reactive (*)    All other components within normal limits  HEPATITIS B SURFACE ANTIBODY, QUANTITATIVE - Abnormal; Notable for the following components:   Hep B S AB Quant (Post) 4.8 (*)    All other components within normal limits  GLUCOSE, CAPILLARY - Abnormal; Notable for the following components:   Glucose-Capillary 165 (*)    All other components within normal limits  GLUCOSE, CAPILLARY - Abnormal; Notable for the following components:   Glucose-Capillary 107 (*)    All other components within normal limits  GLUCOSE, CAPILLARY - Abnormal; Notable for the following components:   Glucose-Capillary 190 (*)    All other components within normal limits  I-STAT CHEM 8, ED - Abnormal; Notable for the following components:   BUN 61 (*)    Creatinine, Ser 11.50 (*)    Glucose, Bld 212 (*)    All other components within normal limits  POC OCCULT BLOOD, ED - Abnormal; Notable for the following components:   Fecal Occult Bld POSITIVE (*)    All other components within normal limits  CBG MONITORING, ED - Abnormal; Notable for the following components:   Glucose-Capillary 252 (*)    All other components within normal limits  PROTIME-INR  HEPATITIS B SURFACE ANTIGEN  HEPATITIS B CORE ANTIBODY, TOTAL  HEPATITIS C ANTIBODY    EKG EKG Interpretation  Date/Time:  Wednesday May 12 2022 11:57:57 EDT Ventricular Rate:  74 PR Interval:  192 QRS Duration: 90 QT Interval:  398 QTC Calculation: 441 R Axis:   -28 Text Interpretation: Normal sinus rhythm Minimal voltage criteria for LVH, may be normal variant ( R in aVL ) Borderline ECG When compared with ECG  of 27-Mar-2022 13:21, PREVIOUS ECG IS PRESENT Confirmed by Addison Lank [phone]) on 05/13/2022 1:24:59 PM  Radiology CT Angio Abd/Pel w/ and/or w/o  Result Date: 05/12/2022 CLINICAL DATA:  71 year old male with lower GI bleeding EXAM: CTA ABDOMEN AND PELVIS WITHOUT AND WITH CONTRAST TECHNIQUE: Multidetector CT imaging of the abdomen and pelvis was performed using the standard protocol during bolus administration of intravenous contrast. Multiplanar reconstructed images and MIPs were obtained and reviewed to evaluate the vascular anatomy. RADIATION DOSE REDUCTION: This exam was performed according to the departmental dose-optimization program which includes automated exposure control, adjustment of the mA and/or kV according to patient size and/or use of iterative reconstruction technique. CONTRAST:  151m OMNIPAQUE IOHEXOL 350 MG/ML SOLN COMPARISON:  12/23/2021, 04/11/2021, 10/21/2017 FINDINGS: VASCULAR Aorta: Mild atherosclerotic changes of the abdominal aorta. No pedunculated plaque, ulcerated plaque, dissection, aneurysm, periaortic fluid or inflammatory change. Celiac: Atherosclerotic changes at the origin of the celiac artery with at least 50% narrowing secondary to a combination calcified plaque as well as the compression of overlying cruise. Branches are patent SMA: Patent, with no significant atherosclerotic changes. Renals: - Right: Single right renal artery. Mild atherosclerosis at the origin. - Left: Single left renal artery. Mild atherosclerosis at the origin. IMA: IMA appears occluded. Right lower extremity: Mild tortuosity of the right iliac system. Common iliac artery patent with mild atherosclerosis. Hypogastric artery patent though with at least 50% narrowing at the origin secondary to mixed calcified and soft plaque. Pelvic arteries patent. External iliac artery patent with minimal atherosclerosis. Common femoral artery is patent with mild atherosclerotic changes. Proximal profunda femoris and  SFA patent. Left lower extremity: Mild tortuosity of the left iliac system. Common iliac artery patent with mild atherosclerosis. Hypogastric artery patent with at least 50%  narrowing at the origin secondary to mixed calcified and soft plaque. External iliac artery patent without significant atherosclerosis. Common femoral artery patent with mild atherosclerosis. Proximal profunda femoris and SFA patent Veins: Unremarkable appearance of the venous system. Review of the MIP images confirms the above findings. NON-VASCULAR Lower chest: No acute finding. Cardiomegaly. Coronary atherosclerosis. Hepatobiliary: Unremarkable appearance of the liver. High density material layered in the gallbladder neck, compatible with microlithiasis/sludge. Pancreas: Relatively atrophic pancreas with fatty infiltration. No inflammatory changes. Spleen: Splenectomy Adrenals/Urinary Tract: - Right adrenal gland: Unremarkable - Left adrenal gland: Unremarkable. - Right kidney: No hydronephrosis. No nephrolithiasis. Atrophic right kidney. Edema within the soft tissue/fat surrounding the kidney. Small/subcentimeter lesions too small to characterize. - Left Kidney: No hydronephrosis. No nephrolithiasis. Atrophy of the left kidney. Stranding/edema within the adjacent fat. Fluid density cyst on the lateral cortex measures 2.5 cm, compatible with Bosniak 1 cyst and does not require further follow-up. 13 mm lesion on the medial cortex of the left kidney on image 22 of series 6 measures fluid density and is most compatible with benign cyst. Additional small lesions are too small to characterize. - Urinary Bladder: Relatively decompressed. Stomach/Bowel: - Stomach: Small hiatal hernia. Otherwise unremarkable stomach. No evidence of contrast accumulation within the stomach. - Small bowel: Small bowel decompressed. No transition point. No focal wall thickening. No accumulation of contrast within small bowel. - Appendix: Normal. 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 Wall enhancement is within normal limits. No focal inflammatory changes. No significant stool burden. No evidence of obstruction. No accumulation of contrast within the colon. Lymphatic: No adenopathy. Mesenteric: No free fluid or air. No mesenteric adenopathy. Reproductive: Unremarkable prostate Other: None Musculoskeletal: No displaced fracture. Multilevel degenerative changes including vacuum disc phenomenon in the lower lumbar spine. No bony canal narrowing. No aggressive lytic or sclerotic lesions. Surgical changes of the right hip. IMPRESSION: CT angiogram negative for evidence of GI bleeding. No acute CT finding. Mild aortic, iliac, and proximal femoral arterial disease. Aortic Atherosclerosis (ICD10-I70.0). 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 Additional ancillary findings as above. Signed, Dulcy Fanny. Nadene Rubins, RPVI [facility] Electronically Signed   By: Corrie Mckusick D.O.   On: 05/12/2022 15:40    Procedures Procedures    Medications Ordered in ED Medications  insulin aspart (novoLOG) injection 0-6 Units ( Subcutaneous Not Given 05/13/22 1750)  sodium chloride flush (NS) 0.9 % injection 3 mL (3 mLs Intravenous Given 05/13/22 2140)  sodium chloride flush (NS) 0.9 % injection 3 mL (has no administration in time range)  0.9 %  sodium chloride infusion (has no administration in time range)  acetaminophen (TYLENOL) tablet 650 mg (has no administration in time range)    Or  acetaminophen (TYLENOL) suppository 650 mg (has no administration in time range)  HYDROmorphone (DILAUDID) injection 0.5 mg (0.5 mg Intravenous Given 05/13/22 1758)  carvedilol (COREG) tablet 25 mg (25 mg Oral Given 05/13/22 2140)  furosemide (LASIX) tablet 80 mg (80 mg Oral Given 05/13/22 0928)  lanthanum (FOSRENOL)  chewable tablet 1,000 mg (1,000 mg Oral Given 05/13/22 1754)  atorvastatin (LIPITOR) tablet 20 mg (20 mg Oral Given 05/13/22 0928)  midodrine (PROAMATINE) tablet 5 mg (has no administration in time range)  insulin glargine-yfgn (SEMGLEE) injection 8 Units (8 Units Subcutaneous Given 05/13/22 2140)  fentaNYL (SUBLIMAZE) injection 50 mcg (50 mcg Intravenous Given 05/12/22  1301)  ondansetron (ZOFRAN) injection 4 mg (4 mg Intravenous Given 05/12/22 1301)  iohexol (OMNIPAQUE) 350 MG/ML injection 100 mL (100 mLs Intravenous Contrast Given 05/12/22 1331)    ED Course/ Medical Decision Making/ A&P                           Medical Decision Making This patient presents to the ED for concern of rectal  bleeding, this involves an extensive number of treatment options, and is a complaint that carries with it a high risk of complications and morbidity.  The differential diagnosis for lower GI bleed includes but is not limited to high flow upper GI bleed, diverticulosis or radiculitis, vascular ectasia/arteriovenous malformation, inflammatory bowel disease, infectious colitis, mesenteric ischemia or ischemic colitis, Meckel's diverticulum, colorectal cancer or polyps, internal hemorrhoids, aortoenteric fistula, rectal foreign body, rectal ulceration or anal fissure.   Co morbidities that complicate the patient evaluation       ESRD   Additional history obtained:  Additional history obtained from emr Previous colonoscopy reviewed shows hemorrhoids and a few polyps   Lab Tests:  I Ordered, and personally interpreted labs.  The pertinent results include:   + occult stool HGB is improved from previous Potassium is wnl  Imaging Studies ordered:  I ordered imaging studies including CT angio ab/pelvis I independently visualized and interpreted imaging which showed No evidence of acute ischemia or evidence of infection I agree with the radiologist interpretation   Cardiac Monitoring:       The  patient was maintained on a cardiac monitor.  I personally viewed and interpreted the cardiac monitored which showed an underlying rhythm of: NSR   Medicines ordered and prescription drug management:  I ordered medication including fentanyl  for chronic e neck pain Reevaluation of the patient after these medicines showed that the patient stayed the same I have reviewed the patients home medicines and have made adjustments as needed      Critical Interventions:          Consultations Obtained:  I requested consultation with the Davis,  and discussed lab and imaging findings as well as pertinent plan- patient lives alone, feels weak, does not feel comfortable being discharged. Patient will need over nignt observation  Problem List / ED Course:       Rectal bleeding   Reevaluation:  After the interventions noted above, I reevaluated the patient and found that they have :improved   Social Determinants of Health:       Social issues include elderly, lives alone, weak    Dispostion:  After consideration of the diagnostic results and the patients response to treatment, I feel that the patent would benefit from observation.    Amount and/or Complexity of Data Reviewed Labs: ordered. Radiology: ordered.  Risk Prescription drug management. Decision regarding hospitalization.     Final Clinical Impression(s) / ED Diagnoses Final diagnoses:  Rectal bleeding    Rx / DC Orders ED Discharge Orders     None         Margarita Mail, PA-C 05/14/22 3007    Ezequiel Essex, MD 05/14/22 1115

## 2022-05-12 NOTE — ED Notes (Signed)
Patient denies any needs at this time and currently no complains while this Rn in the room

## 2022-05-12 NOTE — ED Notes (Addendum)
patinet is complaining of wanting to leave AMA because "no one has updated me and yall aren't doing a dam*n thing for me* so on so forth. He has not asked for anything and hourly rounding is up to date. Patient was updated each time this Rn was in the room and denies this Rn was ever actually in his room. Dr Rogers Blocker notified. Patient asked to kindly wait for the provider. Patient getting up out of bed putting prosthetic on and getting dressed and pulling off all equipment. This Rn tried explaining and talking to the patient again and patient was using profanities and obscenities while talking over this nurse and was not able to be consoled. At this time patient is refusing lab draw.

## 2022-05-12 NOTE — ED Notes (Signed)
This RN enters patient room responding to call light. Patient using profanities and obscenities very loudly as soon as this Rn walks into the room. Patient demanding that this RN make his bed and asking if this RN is blind and cannot see what the patient needs. This RN kindly reminds the patient that this is a healing environment and we are absolutely in no way going to speak to each other that harshly. This RN reminds the patient of each time she came by to check on the patient and when doing so the patient used the same harsh language and reported no complaints. This RN questioned how the patient expects this RN to help him if he is not communicating his needs clearly and instead using harsh language which is highly inappropriate for the situation. Patient apologizes. This Rn makes the patient's bed and provides falls risk eduction. Patient provided full linen change and repositioned in the bed. Call bell at bedside. Patient verbalizes understanding of care instructions. Upon leaving the room patient had  no further complaints. Patient provided with drink, his room tidied up, and warm blankets post repositioning. Patient again updated on the plan of care and given call bell education

## 2022-05-12 NOTE — Assessment & Plan Note (Signed)
Baseline 8-10 Stable, with no acute drops Trending cbc

## 2022-05-12 NOTE — Assessment & Plan Note (Addendum)
Above goal, but needs dialysis Continue home medication: coreg '25mg'$  Bid, lasix '80mg'$ . Appears to have orthostatic hypotension with midodrine prn. Would not tightly control his blood pressure

## 2022-05-12 NOTE — Assessment & Plan Note (Signed)
Seizure precautions  Does not appear to be on any antiepileptic medication

## 2022-05-12 NOTE — Assessment & Plan Note (Addendum)
A1C in January 8.5 Repeat pending  Continue long acting insulin and SSI and accuchecks qac/hs  Decreased long acting from 15>8 units since liquid diet

## 2022-05-12 NOTE — ED Notes (Signed)
[place]man RN reports that she is ready for the patient and has no questions.

## 2022-05-12 NOTE — Assessment & Plan Note (Signed)
No emergent needs for dialysis, but missed session today Nephrology consulted Appreciate assistance
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
## 2022-05-12 NOTE — ED Notes (Signed)
Patient laying in bed on his phone, call bell in reach, patient covered in blankets and patient has changed back out of his clothes. Patient reports no complaints at this time and states that he doesn't need anything but to go home

## 2022-05-12 NOTE — ED Notes (Signed)
Pt called out stating, "give me my clothes" I asked the pt what was wrong and what happened for him to want to go home. Pt stated that he is tired of being here and that we did not feed him a damn thing. Pt then asked me again to give him his clothes, I told the patient that I would not give him his clothes because I would want to notify my nurse first, pt began cursing at me and began moving the bed over closer towards his clothes. I stepped out and notified the RN.

## 2022-05-13 DIAGNOSIS — M79605 Pain in left leg: Secondary | ICD-10-CM | POA: Diagnosis not present

## 2022-05-13 DIAGNOSIS — K625 Hemorrhage of anus and rectum: Secondary | ICD-10-CM | POA: Diagnosis not present

## 2022-05-13 DIAGNOSIS — N186 End stage renal disease: Secondary | ICD-10-CM | POA: Diagnosis not present

## 2022-05-13 LAB — GLUCOSE, CAPILLARY
Glucose-Capillary: 124 mg/dL — ABNORMAL HIGH (ref 70–99)
Glucose-Capillary: 165 mg/dL — ABNORMAL HIGH (ref 70–99)
Glucose-Capillary: 107 mg/dL — ABNORMAL HIGH (ref 70–99)
Glucose-Capillary: 190 mg/dL — ABNORMAL HIGH (ref 70–99)

## 2022-05-13 LAB — BASIC METABOLIC PANEL
Anion gap: 18 — ABNORMAL HIGH (ref 5–15)
BUN: 73 mg/dL — ABNORMAL HIGH (ref 8–23)
CO2: 25 mmol/L (ref 22–32)
Calcium: 9.5 mg/dL (ref 8.9–10.3)
Chloride: 98 mmol/L (ref 98–111)
Creatinine, Ser: 12.86 mg/dL — ABNORMAL HIGH (ref 0.61–1.24)
GFR, Estimated: 4 mL/min — ABNORMAL LOW (ref >60)
Glucose, Bld: 136 mg/dL — ABNORMAL HIGH (ref 70–99)
Potassium: 3.5 mmol/L (ref 3.5–5.1)
Sodium: 141 mmol/L (ref 135–145)

## 2022-05-13 LAB — CBC
HCT: 36.2 % — ABNORMAL LOW (ref 39.0–52.0)
HCT: 37.4 % — ABNORMAL LOW (ref 39.0–52.0)
Hemoglobin: 12 g/dL — ABNORMAL LOW (ref 13.0–17.0)
Hemoglobin: 12.1 g/dL — ABNORMAL LOW (ref 13.0–17.0)
MCH: 28.7 pg (ref 26.0–34.0)
MCH: 29.4 pg (ref 26.0–34.0)
MCHC: 32.4 g/dL (ref 30.0–36.0)
MCHC: 33.1 g/dL (ref 30.0–36.0)
MCV: 88.6 fL (ref 80.0–100.0)
MCV: 88.7 fL (ref 80.0–100.0)
Platelets: 361 10*3/uL (ref 150–400)
Platelets: 400 10*3/uL (ref 150–400)
RBC: 4.08 MIL/uL — ABNORMAL LOW (ref 4.22–5.81)
RBC: 4.22 MIL/uL (ref 4.22–5.81)
RDW: 17.8 % — ABNORMAL HIGH (ref 11.5–15.5)
RDW: 18.1 % — ABNORMAL HIGH (ref 11.5–15.5)
WBC: 10.2 10*3/uL (ref 4.0–10.5)
WBC: 9.4 10*3/uL (ref 4.0–10.5)
nRBC: 0 % (ref 0.0–0.2)
nRBC: 0 % (ref 0.0–0.2)

## 2022-05-13 LAB — HEPATITIS B SURFACE ANTIBODY,QUALITATIVE: Hep B S Ab: REACTIVE — AB

## 2022-05-13 LAB — HEPATITIS B CORE ANTIBODY, TOTAL: Hep B Core Total Ab: NONREACTIVE

## 2022-05-13 LAB — HEPATITIS B SURFACE ANTIGEN: Hepatitis B Surface Ag: NONREACTIVE

## 2022-05-13 LAB — HEPATITIS C ANTIBODY: HCV Ab: NONREACTIVE

## 2022-05-13 NOTE — TOC Initial Note (Signed)
Transition of Care [facility]) - Initial/Assessment Note    Patient Details  Name: Mark [name] MRN: [id number] Date of Birth: 1950/11/03  Transition of Care [facility]) CM/SW Contact:    Verdell Carmine, RN Phone Number: 05/13/2022, 10:21 AM  Clinical Narrative:                  Patient presented with rectal bleeding, missed dialysis yesterday due to this. Patient states he just needs a BSC at home, has all other DME. Patient states he is having a hard time contacting [place]. The number on his card for member services rings busy. Called customer service for patient at [phone] texted him that number as well  He will use it for transportation when DC Expected Discharge Plan: Home/Self Care Barriers to Discharge: Continued Medical Work up   Patient Goals and CMS Choice        Expected Discharge Plan and Services Expected Discharge Plan: Home/Self Care                         DME Arranged: Bedside commode DME Agency: [employer] Date DME Agency Contacted: 05/13/22 Time DME Agency Contacted: 29 Representative spoke with at DME Agency: lucretia            Prior Living Arrangements/Services   Lives with:: Spouse Patient language and need for interpreter reviewed:: Yes Do you feel safe going back to the place where you live?: Yes      Need for Family Participation in Patient Care: Yes (Comment) Care giver support system in place?: Yes (comment) Current home services: DME Criminal Activity/Legal Involvement Pertinent to Current Situation/Hospitalization: No - Comment as needed  Activities of Daily Living Home Assistive Devices/Equipment: Walker (specify type) ADL Screening (condition at time of admission) Patient's cognitive ability adequate to safely complete daily activities?: Yes Is the patient deaf or have difficulty hearing?: No Does the patient have difficulty seeing, even when wearing glasses/contacts?: No Does the patient have difficulty concentrating,  remembering, or making decisions?: No Patient able to express need for assistance with ADLs?: Yes Does the patient have difficulty dressing or bathing?: No Independently performs ADLs?: Yes (appropriate for developmental age) Does the patient have difficulty walking or climbing stairs?: Yes Weakness of Legs: Both Weakness of Arms/Hands: None  Permission Sought/Granted                  Emotional Assessment       Orientation: : Oriented to Self, Oriented to Place, Oriented to  Time, Oriented to Situation Alcohol / Substance Use: Not Applicable Psych Involvement: No (comment)  Admission diagnosis:  Rectal bleeding [K62.5] Patient Active Problem List   Diagnosis Date Noted   ESRD on dialysis ([place]) 05/12/2022   Rectal bleeding 05/12/2022   Left leg pain    Volume overload 02/09/2022   Leg wound, right 02/09/2022   C. difficile colitis 11/20/2021   COVID-19 virus infection 11/19/2021   Fluid overload 11/19/2021   Dyslipidemia 11/19/2021   Abrasion, right lower leg, initial encounter 11/03/2021   History of left below knee amputation ([place]) 34/19/6222   Metabolic bone disease 97/98/9211   Pneumonia of left lower lobe due to infectious organism 08/31/2021   Acute back pain 08/31/2021   Mixed diabetic hyperlipidemia associated with type 2 diabetes mellitus ([place]) 08/31/2021   PAD (peripheral artery disease) ([place]) 02/03/2021   ESRD on hemodialysis ([place]) 11/26/2020   Glaucoma suspect 04/30/2020   NPDR (nonproliferative diabetic retinopathy) ([employer]) 03/18/2020  Anemia due to stage 5 chronic kidney disease, not on chronic dialysis ([place]) 06/22/2019   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   Hip fracture ([place]) 01/01/2017   Impingement syndrome of right shoulder 10/25/2016   Incisional hernia 07/14/2016   IDA (iron deficiency anemia) 03/08/2016   Seizure disorder ([place]) 01/01/2016   History of Clostridium difficile colitis 01/01/2016   Positive for  microalbuminuria 08/18/2015   CKD (chronic kidney disease) stage 3, GFR 30-59 ml/min (HCC) 08/18/2015   GERD (gastroesophageal reflux disease) 04/30/2015   Tinea pedis 04/30/2015   Onychomycosis of toenail 04/30/2015   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-stage renal disease) ([place]) 04/16/2015   Chronic diastolic CHF (congestive heart failure) ([place]) 10/18/2014   Essential hypertension 04/08/2014   Anemia of chronic disease 10/01/2013   PCP:  [name], MD Pharmacy:   [facility] [place], [address] [place] [address] Phone: [phone] Fax: [phone]     Social Determinants of Health (SDOH) Interventions    Readmission Risk Interventions    02/12/2022    2:40 PM 09/02/2021   12:59 PM  Readmission Risk Prevention Plan  Transportation Screening Complete Complete  Medication Review (RN Care Manager) Referral to Pharmacy Complete  PCP or Specialist appointment within 3-5 days of discharge Complete Complete  HRI or Home Care Consult Complete Complete  SW Recovery Care/Counseling Consult Complete Patient refused  Palliative Care Screening Not Applicable Not [place] Not Applicable Not Applicable

## 2022-05-13 NOTE — Progress Notes (Signed)
PROGRESS NOTE    Mark [name]  UEA:[id number] DOB: Sep 13, 1950 DOA: 05/12/2022 PCP: [name], MD   Brief Narrative:  HPI: Mark [name] is a 71 y.o. male with medical history significant of  ESRD on HD MWF, PAD-s/p left BKA, chronic HFpEF, DM-2, HTN, seizure disorder IgM MGUS, normocytic anemia who presented with complaints of bloody diarrhea that started on Friday night. He is having one episode per day. He states it is bright red blood. He has stomach pain as well. He states it's trapped gas and it goes and comes. Pain level is a 9/10. He states pain is in lower abdomen and radiates under his testicles. Calls this "gas pain."  He has no vomiting or fevers. No recent antibiotics. Does have a history of c.diff. He did not do dialysis today. Last full session was on Monday.     Denies any fever/chills, vision changes/headaches, chest pain or palpitations, shortness of breath or cough, , N/V/, dysuria or leg swelling.    He does not smoke or drink. He denies any NSAID use.    Colonoscopy in 2018 with diverticula, polyps. No path report. No NSAId use.    ER Course:  vitals: afebrile, bp: 129/66, HR: 70, RR: 19, oxygen:99%RA Pertinent labs: fecal occult positive, hgb: 11.6, BUN: 65, creatinine: 11.42,  CTA abdomen/pelvis: negative for evidence of GI bleeding. 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 In ED: TRH asked to admit.   Assessment & Plan:   Principal Problem:   Rectal bleeding Active Problems:   ESRD on dialysis ([place])   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-stage renal disease) ([place])   Essential hypertension   Chronic diastolic CHF (congestive heart failure) (HCC)   Anemia of chronic disease   Seizure disorder (HCC)   PAD (peripheral artery disease) (HCC)  Rectal bleeding/anemia of chronic disease: 5-day history of abdominal pain with bright red blood per rectum  however patient's hemoglobin is at his baseline. -CTA abdomen/pelvis: negative for evidence of GI bleeding. 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  I will touch base with vascular surgery about this.  If this is diverticular bleed, more than likely it.  By itself, if not, we will consider consulting GI.  For now continue pain medication and monitoring of H&H.   ESRD on dialysis [facility]): Nephrology on board.   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-stage renal disease) ([place]) A1C in January 8.5, repeat 7.4.  Appears to be taking 15 units of Lantus at home, has been started on 8 units medically here along with SSI, blood sugar controlled.   Essential hypertension: Very well controlled. Continue home medication: coreg '25mg'$  Bid, lasix '80mg'$ . Appears to have orthostatic hypotension with midodrine prn. Would not tightly control his blood pressure    Chronic diastolic CHF (congestive heart failure) (HCC) Euvolemic Volume control per dialysis Echo: 11/22: EF of 60-65% with normal LVF. Grade 1 DD.  Continue medical management with lasix, coreg  Seizure disorder ([place]) Seizure precautions  Does not appear to be on any antiepileptic medication    PAD (peripheral artery disease) ([place]) S/p left BKA Continue statin   DVT prophylaxis: SCDs Start: 05/12/22 1758   Code Status: DNR  Family Communication:  None present at bedside.  Plan of care discussed with patient in length and he/she verbalized understanding  and agreed with it.  Status is: Observation The patient will require care spanning > 2 midnights and should be moved to inpatient because: Patient needs hemodialysis today and then tomorrow also needs overnight observation for lower GI bleed.   Estimated body mass index is 31.2 kg/m as calculated from the following:   Height as of this encounter: '6\' 2"'$  (1.88 m).   Weight as of this encounter:  110.2 kg.    Nutritional Assessment: Body mass index is 31.2 kg/m.Marland Kitchen Seen by dietician.  I agree with the assessment and plan as outlined below: Nutrition Status:        . Skin Assessment: I have examined the patient's skin and I agree with the wound assessment as performed by the wound care RN as outlined below:    Consultants:  Nephrology Vascular  Procedures:  None  Antimicrobials:  Anti-infectives (From admission, onward)    None         Subjective: Seen and examined.  He complains of pain all over the body, shoulders neck and head.  No abdominal pain today.  Has not had any rectal bleeding since Tuesday.  He tells me that he has a history of diverticulosis for which he had some part of his colon taken out almost 20 years ago.  He tells me that he had colonoscopy done in 2018 and 3 polyps were removed which were not malignant.  Objective: Vitals:   05/12/22 2200 05/12/22 2230 05/12/22 2305 05/13/22 0440  BP: (!) 164/95 107/70 (!) 157/90 (!) 155/79  Pulse: 89 89 87 80  Resp: '18  15 14  '$ Temp: 98.4 F (36.9 C)  97.7 F (36.5 C) 97.7 F (36.5 C)  TempSrc:   Oral Oral  SpO2: 96% 92% 92% 95%  Weight:      Height:       No intake or output data in the 24 hours ending 05/13/22 0743 Filed Weights   05/12/22 1123  Weight: 110.2 kg    Examination:  General exam: Appears calm and comfortable  Respiratory system: Clear to auscultation. Respiratory effort normal. Cardiovascular system: S1 & S2 heard, RRR. No JVD, murmurs, rubs, gallops or clicks. No pedal edema. Gastrointestinal system: Abdomen is nondistended, soft and nontender. No organomegaly or masses felt. Normal bowel sounds heard. Central nervous system: Alert and oriented. No focal neurological deficits. Extremities: Left BKA Skin: No rashes, lesions or ulcers Psychiatry: Judgement and insight appear normal. Mood & affect appropriate.    Data Reviewed: I have personally reviewed following labs and  imaging studies  CBC: Recent Labs  Lab 05/12/22 1147 05/12/22 1202 05/12/22 1759 05/12/22 2348 05/13/22 0442  WBC 8.5  --  8.0 10.2 9.4  NEUTROABS 5.0  --   --   --   --   HGB 11.6* 13.3 11.7* 12.0* 12.1*  HCT 34.6* 39.0 35.6* 36.2* 37.4*  MCV 88.7  --  91.0 88.7 88.6  PLT 386  --  405* 361 409   Basic Metabolic Panel: Recent Labs  Lab 05/12/22 1147 05/12/22 1202 05/13/22 0442  NA 139 139 141  K 3.5 3.6 3.5  CL 99 101 98  CO2 26  --  25  GLUCOSE 213* 212* 136*  BUN 65* 61* 73*  CREATININE 11.42* 11.50* 12.86*  CALCIUM 9.5  --  9.5   GFR: Estimated Creatinine Clearance: 7.1 mL/min (A) (by C-G formula based on SCr of 12.86 mg/dL (H)). Liver Function Tests: Recent Labs  Lab 05/12/22 1147  AST 8*  ALT  8  ALKPHOS 71  BILITOT 0.5  PROT 7.3  ALBUMIN 3.1*   No results for input(s): "LIPASE", "AMYLASE" in the last 168 hours. No results for input(s): "AMMONIA" in the last 168 hours. Coagulation Profile: Recent Labs  Lab 05/12/22 1147  INR 1.0   Cardiac Enzymes: No results for input(s): "CKTOTAL", "CKMB", "CKMBINDEX", "TROPONINI" in the last 168 hours. BNP (last 3 results) No results for input(s): "PROBNP" in the last 8760 hours. HbA1C: Recent Labs    05/12/22 1759  HGBA1C 7.4*   CBG: Recent Labs  Lab 05/12/22 2152  GLUCAP 252*   Lipid Profile: No results for input(s): "CHOL", "HDL", "LDLCALC", "TRIG", "CHOLHDL", "LDLDIRECT" in the last 72 hours. Thyroid Function Tests: No results for input(s): "TSH", "T4TOTAL", "FREET4", "T3FREE", "THYROIDAB" in the last 72 hours. Anemia Panel: No results for input(s): "VITAMINB12", "FOLATE", "FERRITIN", "TIBC", "IRON", "RETICCTPCT" in the last 72 hours. Sepsis Labs: No results for input(s): "PROCALCITON", "LATICACIDVEN" in the last 168 hours.  No results found for this or any previous visit (from the past 240 hour(s)).   Radiology Studies: CT Angio Abd/Pel w/ and/or w/o  Result Date: 05/12/2022 CLINICAL DATA:   71 year old male with lower GI bleeding EXAM: CTA ABDOMEN AND PELVIS WITHOUT AND WITH CONTRAST TECHNIQUE: Multidetector CT imaging of the abdomen and pelvis was performed using the standard protocol during bolus administration of intravenous contrast. Multiplanar reconstructed images and MIPs were obtained and reviewed to evaluate the vascular anatomy. RADIATION DOSE REDUCTION: This exam was performed according to the departmental dose-optimization program which includes automated exposure control, adjustment of the mA and/or kV according to patient size and/or use of iterative reconstruction technique. CONTRAST:  173m OMNIPAQUE IOHEXOL 350 MG/ML SOLN COMPARISON:  12/23/2021, 04/11/2021, 10/21/2017 FINDINGS: VASCULAR Aorta: Mild atherosclerotic changes of the abdominal aorta. No pedunculated plaque, ulcerated plaque, dissection, aneurysm, periaortic fluid or inflammatory change. Celiac: Atherosclerotic changes at the origin of the celiac artery with at least 50% narrowing secondary to a combination calcified plaque as well as the compression of overlying cruise. Branches are patent SMA: Patent, with no significant atherosclerotic changes. Renals: - Right: Single right renal artery. Mild atherosclerosis at the origin. - Left: Single left renal artery. Mild atherosclerosis at the origin. IMA: IMA appears occluded. Right lower extremity: Mild tortuosity of the right iliac system. Common iliac artery patent with mild atherosclerosis. Hypogastric artery patent though with at least 50% narrowing at the origin secondary to mixed calcified and soft plaque. Pelvic arteries patent. External iliac artery patent with minimal atherosclerosis. Common femoral artery is patent with mild atherosclerotic changes. Proximal profunda femoris and SFA patent. Left lower extremity: Mild tortuosity of the left iliac system. Common iliac artery patent with mild atherosclerosis. Hypogastric artery patent with at least 50% narrowing at the  origin secondary to mixed calcified and soft plaque. External iliac artery patent without significant atherosclerosis. Common femoral artery patent with mild atherosclerosis. Proximal profunda femoris and SFA patent Veins: Unremarkable appearance of the venous system. Review of the MIP images confirms the above findings. NON-VASCULAR Lower chest: No acute finding. Cardiomegaly. Coronary atherosclerosis. Hepatobiliary: Unremarkable appearance of the liver. High density material layered in the gallbladder neck, compatible with microlithiasis/sludge. Pancreas: Relatively atrophic pancreas with fatty infiltration. No inflammatory changes. Spleen: Splenectomy Adrenals/Urinary Tract: - Right adrenal gland: Unremarkable - Left adrenal gland: Unremarkable. - Right kidney: No hydronephrosis. No nephrolithiasis. Atrophic right kidney. Edema within the soft tissue/fat surrounding the kidney. Small/subcentimeter lesions too small to characterize. - Left Kidney: No hydronephrosis. No nephrolithiasis. Atrophy  of the left kidney. Stranding/edema within the adjacent fat. Fluid density cyst on the lateral cortex measures 2.5 cm, compatible with Bosniak 1 cyst and does not require further follow-up. 13 mm lesion on the medial cortex of the left kidney on image 22 of series 6 measures fluid density and is most compatible with benign cyst. Additional small lesions are too small to characterize. - Urinary Bladder: Relatively decompressed. Stomach/Bowel: - Stomach: Small hiatal hernia. Otherwise unremarkable stomach. No evidence of contrast accumulation within the stomach. - Small bowel: Small bowel decompressed. No transition point. No focal wall thickening. No accumulation of contrast within small bowel. - Appendix: Normal. 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 Wall enhancement is within normal limits. No focal inflammatory changes. No significant stool burden. No evidence of obstruction. No accumulation of  contrast within the colon. Lymphatic: No adenopathy. Mesenteric: No free fluid or air. No mesenteric adenopathy. Reproductive: Unremarkable prostate Other: None Musculoskeletal: No displaced fracture. Multilevel degenerative changes including vacuum disc phenomenon in the lower lumbar spine. No bony canal narrowing. No aggressive lytic or sclerotic lesions. Surgical changes of the right hip. IMPRESSION: CT angiogram negative for evidence of GI bleeding. No acute CT finding. Mild aortic, iliac, and proximal femoral arterial disease. Aortic Atherosclerosis (ICD10-I70.0). 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 Additional ancillary findings as above. Signed, Dulcy Fanny. Nadene Rubins, RPVI [facility] Electronically Signed   By: Corrie Mckusick D.O.   On: 05/12/2022 15:40    Scheduled Meds:  atorvastatin  20 mg Oral Daily   carvedilol  25 mg Oral BID   furosemide  80 mg Oral Daily   insulin aspart  0-6 Units Subcutaneous TID WC   insulin glargine-yfgn  8 Units Subcutaneous QHS   lanthanum  1,000 mg Oral TID WC   sodium chloride flush  3 mL Intravenous Q12H   Continuous Infusions:  sodium chloride       LOS: 0 days   Darliss Cheney, MD [facility]  05/13/2022, 7:43 AM   *Please note that this is a verbal dictation therefore any spelling or grammatical errors are due to the "[place] [employer]" system interpretation.  Please page via [place] and do not message via secure chat for urgent patient care matters. Secure chat can be used for non urgent patient care matters.  How to contact the [facility] Attending or Consulting provider [place] or covering provider during after hours [place], for this patient?  Check the care team in [facility] and look for a) attending/consulting [facility] provider listed and b) the [facility] team listed. Page or secure chat [phone]. Log  into [email or web address] and use [place]'s universal password to access. If you do not have the password, please contact the hospital operator. Locate the [facility] provider you are looking for under [facility] and page to a number that you can be directly reached. If you still have difficulty reaching the provider, please page the [facility] (Director on Call) for the Hospitalists listed on amion for assistance.

## 2022-05-13 NOTE — Care Management Obs Status (Signed)
[place] NOTIFICATION   Patient Details  Name: Mark [name] MRN: [id number] Date of Birth: Apr 23, 1951   Medicare Observation Status Notification Given:  Yes    Verdell Carmine, RN 05/13/2022, 10:08 AM

## 2022-05-13 NOTE — Progress Notes (Signed)
Received patient in bed to unit.  Alert and oriented.  Informed consent signed and in chart.    05/13/22 1407  Vitals  Temp (!) 97.5 F (36.4 C)  Pulse Rate (!) 58  Resp 15  BP 137/66  SpO2 96 %  O2 Device Room Air  Weight 98.9 kg (bed weight - 2.9 for prosthetic)  Type of Weight Pre-Dialysis  Pre Treatment  Is pt a NEW START this admission?  No  What is patient's outpatient schedule? MWF  Vascular access used during treatment Fistula  Patient is receiving dialysis in a chair No  Hemodialysis Consent Verified Yes  Hemodialysis Standing Orders Initiated Yes  ECG (Telemetry) Monitor On Yes  Prime Ordered Normal Saline  Length of  DialysisTreatment -hour(s) 3 Hour(s)  Dialysis mode HD  Dialyzer Revaclear 400  Dialysate 3K;2.5 Ca  Dialysis Anticoagulation Automated NS Flushes  Dialysate Flow Ordered 300  Blood Flow Rate Ordered 400 mL/min  Ultrafiltration Goal 2.5 Liters  Dialysis Blood Pressure Support Ordered Normal Saline    [facility]

## 2022-05-13 NOTE — Progress Notes (Signed)
  Treatment completed, but Pt requested to end tx early.  Patient tolerated well.  Pt awaiting transport back to the room  Alert, without acute distress.  Hand-off given to patient's nurse.   Access used: Left AVF Access issues: Decreased BFR d/t increased AVP  Total UF removed: 1.7L Medication(s) given: None Post HD VS:     05/13/22 1647  Vitals  Temp 97.6 F (36.4 C)  Pulse Rate 73  Resp 15  BP (!) 95/52  SpO2 97 %  O2 Device Room Air  Weight 97.3 kg (bed weight - 2.9kg for prosthetic)  Type of Weight Post-Dialysis  Oxygen Therapy  Patient Activity (if Appropriate) In bed  Pulse Oximetry Type Continuous  Post Treatment  Dialyzer Clearance Clear  Duration of HD Treatment -hour(s) 2 hour(s) (2h 62m  Liters Processed 45.8  Fluid Removed 1700 mL  Tolerated HD Treatment Yes    [facility]
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
## 2022-05-14 DIAGNOSIS — Z992 Dependence on renal dialysis: Secondary | ICD-10-CM | POA: Diagnosis not present

## 2022-05-14 DIAGNOSIS — E1122 Type 2 diabetes mellitus with diabetic chronic kidney disease: Secondary | ICD-10-CM | POA: Diagnosis not present

## 2022-05-14 DIAGNOSIS — N186 End stage renal disease: Secondary | ICD-10-CM | POA: Diagnosis not present

## 2022-05-14 DIAGNOSIS — D631 Anemia in chronic kidney disease: Secondary | ICD-10-CM | POA: Diagnosis not present

## 2022-05-14 DIAGNOSIS — D509 Iron deficiency anemia, unspecified: Secondary | ICD-10-CM | POA: Diagnosis not present

## 2022-05-14 DIAGNOSIS — N2581 Secondary hyperparathyroidism of renal origin: Secondary | ICD-10-CM | POA: Diagnosis not present

## 2022-05-14 DIAGNOSIS — K625 Hemorrhage of anus and rectum: Secondary | ICD-10-CM | POA: Diagnosis not present

## 2022-05-14 DIAGNOSIS — D689 Coagulation defect, unspecified: Secondary | ICD-10-CM | POA: Diagnosis not present

## 2022-05-14 LAB — HEPATITIS B SURFACE ANTIBODY, QUANTITATIVE: Hep B S AB Quant (Post): 4.8 m[IU]/mL — ABNORMAL LOW (ref >9.9)

## 2022-05-14 NOTE — Discharge Summary (Signed)
[facility] Discharge Summary  HARM JOU ZOX:[id number] DOB: [birth year] DOA: 05/12/2022  PCP: [name], MD  Admit date: 05/12/2022 Discharge date: 05/14/2022    Admitted From: Home Disposition: Left AGAINST MEDICAL ADVICE  Recommendations for Outpatient Follow-up:  Follow up with PCP in 1-2 weeks Please obtain BMP/CBC in one week Please follow up with your PCP on the following pending results: Unresulted Labs (From admission, onward)    None       Discharge Condition: Unknown CODE STATUS: DNR Diet recommendation: Renal/cardiac  Subjective: Patient was seen and examined on rounds by myself on 05/13/2022.  Patient had left AGAINST MEDICAL ADVICE just before midnight.  Night coverage was called.  I was not on duty at that time.  HPI: Mark [name] is a 71 y.o. male with medical history significant of  ESRD on HD MWF, PAD-s/p left BKA, chronic HFpEF, DM-2, HTN, seizure disorder IgM MGUS, normocytic anemia who presented with complaints of bloody diarrhea that started on Friday night. He is having one episode per day. He states it is bright red blood. He has stomach pain as well. He states it's trapped gas and it goes and comes. Pain level is a 9/10. He states pain is in lower abdomen and radiates under his testicles. Calls this "gas pain."  He has no vomiting or fevers. No recent antibiotics. Does have a history of c.diff. He did not do dialysis today. Last full session was on Monday.     Denies any fever/chills, vision changes/headaches, chest pain or palpitations, shortness of breath or cough, , N/V/, dysuria or leg swelling.    He does not smoke or drink. He denies any NSAID use.    Colonoscopy in 2018 with diverticula, polyps. No path report. No NSAId use.    ER Course:  vitals: afebrile, bp: 129/66, HR: 70, RR: 19, oxygen:99%RA Pertinent labs: fecal occult positive, hgb: 11.6, BUN: 65, creatinine: 11.42,  CTA abdomen/pelvis: negative for evidence of GI  bleeding. 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 In ED: [facility] asked to admit.   Brief/Interim Summary: Briefly, patient was basically admitted with rectal bleeding however his hemoglobin remained stable and he has anemia of chronic disease.  Patient did not have any further episodes of rectal bleeding since the Tuesday, 05/11/2022.  Since this was presumed to be diverticular bleed which tends to stop by itself without needing any colonoscopy, GI was not consulted however threshold for considering GI was low if he were to have continuous bleeding.  He has history of ESRD for which nephrology was consulted and he received his dialysis on 05/13/2022.  Next dialysis was on 05/14/2022 and he gets dialysis on Monday Wednesday Friday.  Rest of the medical issues were stable.  Apparently at late night between the hours of 11 PM and 12 AM on 05/13/2022, patient became very upset for some reasons and decided to leave [place].  Per nurses note, night coverage hospitalist service were notified.  Discharge plan was discussed with patient and/or family member and they verbalized understanding and agreed with it.  Discharge Diagnoses:  Principal Problem:   Rectal bleeding Active Problems:   ESRD on dialysis ([place])   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-stage renal disease) ([place])   Essential hypertension   Chronic diastolic CHF (congestive heart failure) ([facility])   Anemia of chronic disease   Seizure disorder ([facility])   PAD (peripheral  artery disease) ([place])    Discharge Instructions   Allergies as of 05/14/2022       Reactions   Oxycodone Shortness Of Breath, Other (See Comments)   "Felt like he was having a heart attack"        Medication List     TAKE these medications    acetaminophen 650 MG CR tablet Commonly known as: TYLENOL Take 1 tablet (650 mg total) by mouth 2 (two) times daily  as needed for pain. What changed:  how much to take when to take this   atorvastatin 20 MG tablet Commonly known as: LIPITOR Take 1 tablet (20 mg total) by mouth daily.   carvedilol 25 MG tablet Commonly known as: COREG Take 1 tablet (25 mg total) by mouth 2 (two) times daily.   furosemide 80 MG tablet Commonly known as: LASIX Take 1 tablet (80 mg total) by mouth daily.   HYDROcodone-acetaminophen 5-325 MG tablet Commonly known as: NORCO/VICODIN Take 1 tablet by mouth every 4 (four) hours as needed.   INSULIN SYRINGE .5CC/30GX5/16" 30G X 5/16" 0.5 ML Misc Check blood sugar TID & QHS   lanthanum 1000 MG chewable tablet Commonly known as: FOSRENOL Chew 1,000 mg by mouth in the morning, at noon, in the evening, and at bedtime.   Lantus SoloStar 100 UNIT/ML Solostar Pen Generic drug: insulin glargine Inject 20 Units into the skin at bedtime.   midodrine 5 MG tablet Commonly known as: PROAMATINE Take 5 mg by mouth daily. Do not taking if BP is low.   multivitamin with minerals Tabs tablet Take 1 tablet by mouth daily.   OneTouch Delica Lancets 06C Misc Use as directed to test blood sugar three times daily.   OneTouch Verio test strip Generic drug: glucose blood USE AS DIRECTED THREE TIMES DAILY TO  TEST  BLOOD  SUGAR   OneTouch Verio w/Device Kit Use as directed to test blood sugar three times daily.   OVER THE COUNTER MEDICATION Take 1 tablet by mouth daily. Mega Man   TRUEplus 5-Bevel Pen Needles 32G X 4 MM Misc Generic drug: Insulin Pen Needle Use to administer Lantus once daily.   VITAMIN D PO Take 1 capsule by mouth in the morning and at bedtime.               Durable Medical Equipment  (From admission, onward)           Start     Ordered   05/13/22 1019  For home use only DME Bedside commode  Once       Question:  Patient needs a bedside commode to treat with the following condition  Answer:  Rectal bleeding   05/13/22 1018             Allergies  Allergen Reactions   Oxycodone Shortness Of Breath and Other (See Comments)    "Felt like he was having a heart attack"    Consultations: Nephrology   Procedures/Studies: CT Angio Abd/Pel w/ and/or w/o  Result Date: 05/12/2022 CLINICAL DATA:  71 year old male with lower GI bleeding EXAM: CTA ABDOMEN AND PELVIS WITHOUT AND WITH CONTRAST TECHNIQUE: Multidetector CT imaging of the abdomen and pelvis was performed using the standard protocol during bolus administration of intravenous contrast. Multiplanar reconstructed images and MIPs were obtained and reviewed to evaluate the vascular anatomy. RADIATION DOSE REDUCTION: This exam was performed according to the departmental dose-optimization program which includes automated exposure control, adjustment of the mA and/or kV according to patient size  and/or use of iterative reconstruction technique. CONTRAST:  164m OMNIPAQUE IOHEXOL 350 MG/ML SOLN COMPARISON:  12/23/2021, 04/11/2021, 10/21/2017 FINDINGS: VASCULAR Aorta: Mild atherosclerotic changes of the abdominal aorta. No pedunculated plaque, ulcerated plaque, dissection, aneurysm, periaortic fluid or inflammatory change. Celiac: Atherosclerotic changes at the origin of the celiac artery with at least 50% narrowing secondary to a combination calcified plaque as well as the compression of overlying cruise. Branches are patent SMA: Patent, with no significant atherosclerotic changes. Renals: - Right: Single right renal artery. Mild atherosclerosis at the origin. - Left: Single left renal artery. Mild atherosclerosis at the origin. IMA: IMA appears occluded. Right lower extremity: Mild tortuosity of the right iliac system. Common iliac artery patent with mild atherosclerosis. Hypogastric artery patent though with at least 50% narrowing at the origin secondary to mixed calcified and soft plaque. Pelvic arteries patent. External iliac artery patent with minimal atherosclerosis. Common femoral  artery is patent with mild atherosclerotic changes. Proximal profunda femoris and SFA patent. Left lower extremity: Mild tortuosity of the left iliac system. Common iliac artery patent with mild atherosclerosis. Hypogastric artery patent with at least 50% narrowing at the origin secondary to mixed calcified and soft plaque. External iliac artery patent without significant atherosclerosis. Common femoral artery patent with mild atherosclerosis. Proximal profunda femoris and SFA patent Veins: Unremarkable appearance of the venous system. Review of the MIP images confirms the above findings. NON-VASCULAR Lower chest: No acute finding. Cardiomegaly. Coronary atherosclerosis. Hepatobiliary: Unremarkable appearance of the liver. High density material layered in the gallbladder neck, compatible with microlithiasis/sludge. Pancreas: Relatively atrophic pancreas with fatty infiltration. No inflammatory changes. Spleen: Splenectomy Adrenals/Urinary Tract: - Right adrenal gland: Unremarkable - Left adrenal gland: Unremarkable. - Right kidney: No hydronephrosis. No nephrolithiasis. Atrophic right kidney. Edema within the soft tissue/fat surrounding the kidney. Small/subcentimeter lesions too small to characterize. - Left Kidney: No hydronephrosis. No nephrolithiasis. Atrophy of the left kidney. Stranding/edema within the adjacent fat. Fluid density cyst on the lateral cortex measures 2.5 cm, compatible with Bosniak 1 cyst and does not require further follow-up. 13 mm lesion on the medial cortex of the left kidney on image 22 of series 6 measures fluid density and is most compatible with benign cyst. Additional small lesions are too small to characterize. - Urinary Bladder: Relatively decompressed. Stomach/Bowel: - Stomach: Small hiatal hernia. Otherwise unremarkable stomach. No evidence of contrast accumulation within the stomach. - Small bowel: Small bowel decompressed. No transition point. No focal wall thickening. No  accumulation of contrast within small bowel. - Appendix: Normal. 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 Wall enhancement is within normal limits. No focal inflammatory changes. No significant stool burden. No evidence of obstruction. No accumulation of contrast within the colon. Lymphatic: No adenopathy. Mesenteric: No free fluid or air. No mesenteric adenopathy. Reproductive: Unremarkable prostate Other: None Musculoskeletal: No displaced fracture. Multilevel degenerative changes including vacuum disc phenomenon in the lower lumbar spine. No bony canal narrowing. No aggressive lytic or sclerotic lesions. Surgical changes of the right hip. IMPRESSION: CT angiogram negative for evidence of GI bleeding. No acute CT finding. Mild aortic, iliac, and proximal femoral arterial disease. Aortic Atherosclerosis (ICD10-I70.0). 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 Additional ancillary findings as above. Signed, JDulcy Fanny WNadene Rubins RPVI [facility] Electronically Signed   By: JCorrie MckusickD.O.   On: 05/12/2022 15:40   DG Shoulder  Left  Result Date: 05/03/2022 CLINICAL DATA:  Left shoulder pain for 2 weeks without known injury. EXAM: LEFT SHOULDER - 2+ VIEW COMPARISON:  None Available. FINDINGS: There is no evidence of fracture or dislocation. Mild degenerative changes seen involving the left acromioclavicular joint. Soft tissues are unremarkable. IMPRESSION: Mild degenerative joint disease of the left acromioclavicular joint. No acute abnormality seen. Electronically Signed   By: Marijo Conception M.D.   On: 05/03/2022 09:07   DG Cervical Spine Complete  Result Date: 05/03/2022 CLINICAL DATA:  Neck pain for 2 weeks without known injury. EXAM: CERVICAL SPINE - COMPLETE 4+ VIEW COMPARISON:  November 22, 2016. FINDINGS: There is no  evidence of cervical spine fracture or prevertebral soft tissue swelling. Alignment is normal. Moderate degenerative disc disease is noted at C3-4, C4-5, C5-6, C6-7 C7-T1 with anterior osteophyte formation. IMPRESSION: Moderate multilevel degenerative disc disease. No acute abnormality seen. Electronically Signed   By: Marijo Conception M.D.   On: 05/03/2022 09:05   DG Chest 2 View  Result Date: 05/03/2022 CLINICAL DATA:  Dialysis. EXAM: CHEST - 2 VIEW COMPARISON:  March 27, 2022. FINDINGS: Stable cardiomediastinal silhouette. Both lungs are clear. The visualized skeletal structures are unremarkable. IMPRESSION: No active cardiopulmonary disease. Electronically Signed   By: Marijo Conception M.D.   On: 05/03/2022 09:03   DG Knee Complete 4 Views Right  Result Date: 04/25/2022 CLINICAL DATA:  Right knee pain for the past 3 weeks. Patient also reports chronic right knee pain. EXAM: RIGHT KNEE - COMPLETE 4+ VIEW COMPARISON:  None Available. FINDINGS: Moderate medial and lateral joint space narrowing and associated spur formation. Moderate marked patellofemoral spur formation and joint space narrowing. Moderate-sized effusion. No fracture or dislocation seen. Intramedullary rod and screw fixation of the right femur. Atheromatous arterial calcifications. IMPRESSION: 1. Tricompartmental degenerative changes. 2. Moderate-sized effusion. Electronically Signed   By: Claudie Revering M.D.   On: 04/25/2022 11:20     Discharge Exam: Vitals:   05/13/22 1647 05/13/22 1943  BP: (!) 95/52 130/74  Pulse: 73 67  Resp: 15 14  Temp: 97.6 F (36.4 C) 97.6 F (36.4 C)  SpO2: 97% 93%   Vitals:   05/13/22 1630 05/13/22 1641 05/13/22 1647 05/13/22 1943  BP: 123/77 122/61 (!) 95/52 130/74  Pulse: 72 75 73 67  Resp: _0 Temp:   97.6 F (36.4 C) 97.6 F (36.4 C)  TempSrc:    Oral  SpO2: 95% 98% 97% 93%  Weight:   97.3 kg   Height:       Following examination is based on my evaluation of the patient on the  morning rounds of 05/13/2022.  General: Pt is alert, awake, not in acute distress Cardiovascular: RRR, S1/S2 +, no rubs, no gallops Respiratory: CTA bilaterally, no wheezing, no rhonchi Abdominal: Soft, NT, ND, bowel sounds + Extremities: Left BKA.    The results of significant diagnostics from this hospitalization (including imaging, microbiology, ancillary and laboratory) are listed below for reference.     Microbiology: No results found for this or any previous visit (from the past 240 hour(s)).   Labs: BNP (last 3 results) Recent Labs    11/19/21 2022 02/09/22 1010  BNP 407.8* 7,096.2*   Basic Metabolic Panel: Recent Labs  Lab 05/12/22 1147 05/12/22 1202 05/13/22 0442  NA 139 139 141  K 3.5 3.6 3.5  CL 99 101 98  CO2 26  --  25  GLUCOSE 213* 212* 136*  BUN 65*  61* 73*  CREATININE 11.42* 11.50* 12.86*  CALCIUM 9.5  --  9.5   Liver Function Tests: Recent Labs  Lab 05/12/22 1147  AST 8*  ALT 8  ALKPHOS 71  BILITOT 0.5  PROT 7.3  ALBUMIN 3.1*   No results for input(s): "LIPASE", "AMYLASE" in the last 168 hours. No results for input(s): "AMMONIA" in the last 168 hours. CBC: Recent Labs  Lab 05/12/22 1147 05/12/22 1202 05/12/22 1759 05/12/22 2348 05/13/22 0442  WBC 8.5  --  8.0 10.2 9.4  NEUTROABS 5.0  --   --   --   --   HGB 11.6* 13.3 11.7* 12.0* 12.1*  HCT 34.6* 39.0 35.6* 36.2* 37.4*  MCV 88.7  --  91.0 88.7 88.6  PLT 386  --  405* 361 400   Cardiac Enzymes: No results for input(s): "CKTOTAL", "CKMB", "CKMBINDEX", "TROPONINI" in the last 168 hours. BNP: Invalid input(s): "POCBNP" CBG: Recent Labs  Lab 05/12/22 2152 05/13/22 0823 05/13/22 1212 05/13/22 1733 05/13/22 2121  GLUCAP 252* 124* 165* 107* 190*   D-Dimer No results for input(s): "DDIMER" in the last 72 hours. Hgb A1c Recent Labs    05/12/22 1759  HGBA1C 7.4*   Lipid Profile No results for input(s): "CHOL", "HDL", "LDLCALC", "TRIG", "CHOLHDL", "LDLDIRECT" in the last 72  hours. Thyroid function studies No results for input(s): "TSH", "T4TOTAL", "T3FREE", "THYROIDAB" in the last 72 hours.  Invalid input(s): "FREET3" Anemia work up No results for input(s): "VITAMINB12", "FOLATE", "FERRITIN", "TIBC", "IRON", "RETICCTPCT" in the last 72 hours. Urinalysis    Component Value Date/Time   COLORURINE STRAW (A) 08/31/2021 1007   APPEARANCEUR CLEAR 08/31/2021 1007   LABSPEC 1.009 08/31/2021 1007   PHURINE 8.0 08/31/2021 1007   GLUCOSEU >=500 (A) 08/31/2021 1007   HGBUR SMALL (A) 08/31/2021 1007   BILIRUBINUR NEGATIVE 08/31/2021 1007   KETONESUR 5 (A) 08/31/2021 1007   PROTEINUR 100 (A) 08/31/2021 1007   UROBILINOGEN 0.2 04/18/2015 1544   NITRITE NEGATIVE 08/31/2021 1007   LEUKOCYTESUR NEGATIVE 08/31/2021 1007   Sepsis Labs Recent Labs  Lab 05/12/22 1147 05/12/22 1759 05/12/22 2348 05/13/22 0442  WBC 8.5 8.0 10.2 9.4   Microbiology No results found for this or any previous visit (from the past 240 hour(s)).   Time coordinating discharge: Over 30 minutes  SIGNED:   Darliss Cheney, MD  [facility] 05/14/2022, 8:31 AM *Please note that this is a verbal dictation therefore any spelling or grammatical errors are due to the "[place] [employer]" system interpretation. If 7PM-7AM, please contact night-coverage [email or web address]

## 2022-05-14 NOTE — Progress Notes (Signed)
Late Entry Note:  Pt left AMA late last evening. Contacted [facility] to advise staff of pt leaving and that pt should resume today.   Melven Sartorius Renal Navigator [phone]

## 2022-05-14 NOTE — Progress Notes (Signed)
Patient states that he wants to leave AMA. He says, "I have been here 2 days and y'all haven't done a damn thing for me." Patient then begin to get irate and curse at nursing staff. MD on call notified that patient wants to leave AMA.  Patient provided with AMA paperwork and made aware of risks of leaving hospital prior to being discharged. Patient got dressed and gathered all of his belongings. Patient able to ambulate off of unit using his walker and prosthetic.

## 2022-05-17 ENCOUNTER — Telehealth: Payer: Self-pay

## 2022-05-17 ENCOUNTER — Inpatient Hospital Stay: Payer: Medicare Other | Attending: Internal Medicine

## 2022-05-17 DIAGNOSIS — N189 Chronic kidney disease, unspecified: Secondary | ICD-10-CM | POA: Insufficient documentation

## 2022-05-17 DIAGNOSIS — D631 Anemia in chronic kidney disease: Secondary | ICD-10-CM | POA: Diagnosis not present

## 2022-05-17 DIAGNOSIS — D689 Coagulation defect, unspecified: Secondary | ICD-10-CM | POA: Diagnosis not present

## 2022-05-17 DIAGNOSIS — N2581 Secondary hyperparathyroidism of renal origin: Secondary | ICD-10-CM | POA: Diagnosis not present

## 2022-05-17 DIAGNOSIS — D472 Monoclonal gammopathy: Secondary | ICD-10-CM | POA: Insufficient documentation

## 2022-05-17 DIAGNOSIS — Z992 Dependence on renal dialysis: Secondary | ICD-10-CM | POA: Diagnosis not present

## 2022-05-17 DIAGNOSIS — N186 End stage renal disease: Secondary | ICD-10-CM | POA: Diagnosis not present

## 2022-05-17 DIAGNOSIS — E1122 Type 2 diabetes mellitus with diabetic chronic kidney disease: Secondary | ICD-10-CM | POA: Diagnosis not present

## 2022-05-17 DIAGNOSIS — D509 Iron deficiency anemia, unspecified: Secondary | ICD-10-CM | POA: Diagnosis not present

## 2022-05-17 NOTE — Telephone Encounter (Signed)
Transition Care Management Follow-up Telephone Call  Patient returned my call.  Date of discharge and from where: 05/14/2022 , [facility]- left AMA How have you been since you were released from the hospital? He stated he is doing all right Any questions or concerns? Yes- he said he needs a new insurance plan. He is not pleased with [facility] and said he can't reach anyone but he will figure it out,  He said he calls to arrange a ride and they send a car or no one at at all.  When I told him that he also has Medicaid and can call DSS, he said that is who he uses.  When I asked him how he gets to dialysis, he said " thank you. I will figure it out." After multiple times of saying thank you, he hung up.   Items Reviewed: Did the pt receive and understand the discharge instructions provided?  No discharge instructions, left AMA Medications obtained and verified?  He said he doesn't have all of his medications and noted " that is a different story."  Other? No  Any new allergies since your discharge? No  Dietary orders reviewed? No Do you have support at home?  Not addressed   Home Care and Equipment/Supplies: Were home health services ordered? no If so, what is the name of the agency? N/a  Has the agency set up a time to come to the patient's home? not applicable Were any new equipment or medical supplies ordered?  No What is the name of the medical supply agency? N/a Were you able to get the supplies/equipment? not applicable Do you have any questions related to the use of the equipment or supplies? No  Functional Questionnaire: (I = Independent and D = Dependent) ADLs: not addressed, he hung up before this was discussed.     Follow up appointments reviewed:  PCP Hospital f/u appt confirmed?  He did not want to schedule an appointment at this time   [facility] f/u appt confirmed? No , nothing scheduled.  Are transportation arrangements needed?  He has concerns about using  his transportation options.  If their condition worsens, is the pt aware to call PCP or go to the Emergency Dept.? Yes Was the patient provided with contact information for the PCP's office or ED? Yes Was to pt encouraged to call back with questions or concerns? Yes

## 2022-05-17 NOTE — Telephone Encounter (Signed)
Transition Care Management Unsuccessful Follow-up Telephone Call  Date of discharge and from where:  05/14/2022, [facility] - left AMA  Attempts:  1st Attempt  Reason for unsuccessful TCM follow-up call:  Left voice message  [phone], call back requested

## 2022-05-18 DIAGNOSIS — A419 Sepsis, unspecified organism: Secondary | ICD-10-CM | POA: Diagnosis not present

## 2022-05-18 DIAGNOSIS — M79605 Pain in left leg: Secondary | ICD-10-CM | POA: Diagnosis not present

## 2022-05-18 DIAGNOSIS — M726 Necrotizing fasciitis: Secondary | ICD-10-CM | POA: Diagnosis not present

## 2022-05-18 DIAGNOSIS — L0390 Cellulitis, unspecified: Secondary | ICD-10-CM | POA: Diagnosis not present

## 2022-05-18 DIAGNOSIS — S81801A Unspecified open wound, right lower leg, initial encounter: Secondary | ICD-10-CM | POA: Diagnosis not present

## 2022-05-21 DIAGNOSIS — Z992 Dependence on renal dialysis: Secondary | ICD-10-CM | POA: Diagnosis not present

## 2022-05-21 DIAGNOSIS — D509 Iron deficiency anemia, unspecified: Secondary | ICD-10-CM | POA: Diagnosis not present

## 2022-05-21 DIAGNOSIS — E1122 Type 2 diabetes mellitus with diabetic chronic kidney disease: Secondary | ICD-10-CM | POA: Diagnosis not present

## 2022-05-21 DIAGNOSIS — D689 Coagulation defect, unspecified: Secondary | ICD-10-CM | POA: Diagnosis not present

## 2022-05-21 DIAGNOSIS — D631 Anemia in chronic kidney disease: Secondary | ICD-10-CM | POA: Diagnosis not present

## 2022-05-21 DIAGNOSIS — N186 End stage renal disease: Secondary | ICD-10-CM | POA: Diagnosis not present

## 2022-05-21 DIAGNOSIS — N2581 Secondary hyperparathyroidism of renal origin: Secondary | ICD-10-CM | POA: Diagnosis not present

## 2022-05-24 ENCOUNTER — Ambulatory Visit: Payer: Self-pay | Admitting: *Deleted

## 2022-05-24 ENCOUNTER — Inpatient Hospital Stay: Payer: Medicare Other

## 2022-05-24 ENCOUNTER — Telehealth: Payer: Self-pay

## 2022-05-24 ENCOUNTER — Other Ambulatory Visit: Payer: Self-pay

## 2022-05-24 DIAGNOSIS — N189 Chronic kidney disease, unspecified: Secondary | ICD-10-CM | POA: Diagnosis not present

## 2022-05-24 DIAGNOSIS — D631 Anemia in chronic kidney disease: Secondary | ICD-10-CM | POA: Diagnosis not present

## 2022-05-24 DIAGNOSIS — D472 Monoclonal gammopathy: Secondary | ICD-10-CM | POA: Diagnosis not present

## 2022-05-24 LAB — CMP (CANCER CENTER ONLY)
ALT: 8 U/L (ref 0–44)
AST: 6 U/L — ABNORMAL LOW (ref 15–41)
Albumin: 3.6 g/dL (ref 3.5–5.0)
Alkaline Phosphatase: 76 U/L (ref 38–126)
Anion gap: 15 (ref 5–15)
BUN: 88 mg/dL — ABNORMAL HIGH (ref 8–23)
CO2: 24 mmol/L (ref 22–32)
Calcium: 9.6 mg/dL (ref 8.9–10.3)
Chloride: 103 mmol/L (ref 98–111)
Creatinine: 11.79 mg/dL (ref 0.61–1.24)
GFR, Estimated: 4 mL/min — ABNORMAL LOW (ref >60)
Glucose, Bld: 159 mg/dL — ABNORMAL HIGH (ref 70–99)
Potassium: 4.8 mmol/L (ref 3.5–5.1)
Sodium: 142 mmol/L (ref 135–145)
Total Bilirubin: 0.4 mg/dL (ref 0.3–1.2)
Total Protein: 7.5 g/dL (ref 6.5–8.1)

## 2022-05-24 LAB — CBC WITH DIFFERENTIAL (CANCER CENTER ONLY)
Abs Immature Granulocytes: 0.01 10*3/uL (ref 0.00–0.07)
Basophils Absolute: 0.1 10*3/uL (ref 0.0–0.1)
Basophils Relative: 1 %
Eosinophils Absolute: 0.4 10*3/uL (ref 0.0–0.5)
Eosinophils Relative: 5 %
HCT: 31.3 % — ABNORMAL LOW (ref 39.0–52.0)
Hemoglobin: 10.4 g/dL — ABNORMAL LOW (ref 13.0–17.0)
Immature Granulocytes: 0 %
Lymphocytes Relative: 42 %
Lymphs Abs: 3.3 10*3/uL (ref 0.7–4.0)
MCH: 29.2 pg (ref 26.0–34.0)
MCHC: 33.2 g/dL (ref 30.0–36.0)
MCV: 87.9 fL (ref 80.0–100.0)
Monocytes Absolute: 0.6 10*3/uL (ref 0.1–1.0)
Monocytes Relative: 8 %
Neutro Abs: 3.5 10*3/uL (ref 1.7–7.7)
Neutrophils Relative %: 44 %
Platelet Count: 389 10*3/uL (ref 150–400)
RBC: 3.56 MIL/uL — ABNORMAL LOW (ref 4.22–5.81)
RDW: 19 % — ABNORMAL HIGH (ref 11.5–15.5)
WBC Count: 7.8 10*3/uL (ref 4.0–10.5)
nRBC: 0 % (ref 0.0–0.2)

## 2022-05-24 LAB — FERRITIN: Ferritin: 750 ng/mL — ABNORMAL HIGH (ref 24–336)

## 2022-05-24 NOTE — Telephone Encounter (Signed)
  Chief Complaint: sore to back of knee left leg amputated leg Symptoms: reports "sore" or abrasion to back of knee left leg amputated leg drainage clear, with odor. Denies fever. Hx diabetes. Reports he feels prosthetic limb caused area. Frequency: 6 days ago  Pertinent Negatives: Patient denies fever no chills.  Disposition: '[]'$ ED /'[x]'$ Urgent Care (no appt availability in office) / '[]'$ Appointment(In office/virtual)/ '[]'$  [place] [facility]/ '[]'$ Home Care/ '[]'$ Refused Recommended Disposition /'[]'$ [place] [facility]/ '[]'$  Follow-up with PCP Additional Notes:   Patient reports he wants to see PCP due to no transportation today to UC. Reports he needs to contact [facility] for transportation. No available appt until October. Please advise. Patient does not want to go to ED or UC.     Reason for Disposition  [1] Looks infected AND [2] large red area (> 2 inches or 5 cm) or streak  Answer Assessment - Initial Assessment Questions 1. APPEARANCE of INJURY: "What does the injury look like?"      Patient can not see area well. Back of knee left leg amputated leg 2. SIZE: "How large is the cut?"      Abrasion from prosthetic limb per patient 3. BLEEDING: "Is it bleeding now?" If Yes, ask: "Is it difficult to stop?"      No pus noted per patient with a smell 4. LOCATION: "Where is the injury located?"      Back of knee left leg  5. ONSET: "How long ago did the injury occur?"      6 days ago  6. MECHANISM: "Tell me how it happened."      Patient reports he feels prosthetic leg caused area  7. TETANUS: "When was the last tetanus booster?"     Yes due 8.16.2027 8. PREGNANCY: "Is there any chance you are pregnant?" "When was your last menstrual period?"     na  Protocols used: Skin Injury-A-AH

## 2022-05-24 NOTE — Telephone Encounter (Signed)
Mark [name] in lab called a critical lab value report of Scr+ 11.79 for this pt.  Pt is a ESRD pt.  Notified Dr. Burr Medico of the pt's lab value.

## 2022-05-24 NOTE — Telephone Encounter (Signed)
Summary: sore w/ smell on leg   Pt states he has a sore on his lt amputated leg w/ a smell   Pt requesting an antibiotic   Please fu w/ pt

## 2022-05-24 NOTE — Telephone Encounter (Signed)
Patient is calling back- needs appointment and has not heard anything from the office- has to let transportation know 24 hours ahead. Messages sent to office to see if I can get him scheduled.

## 2022-05-25 ENCOUNTER — Telehealth: Payer: Self-pay

## 2022-05-25 LAB — KAPPA/LAMBDA LIGHT CHAINS
Kappa free light chain: 315 mg/L — ABNORMAL HIGH (ref 3.3–19.4)
Kappa, lambda light chain ratio: 2.66 — ABNORMAL HIGH (ref 0.26–1.65)
Lambda free light chains: 118.6 mg/L — ABNORMAL HIGH (ref 5.7–26.3)

## 2022-05-25 NOTE — Telephone Encounter (Signed)
I spoke to the patient this morning regarding scheduling transportation to come to [facility] this afternoon to see Dr Wynetta Emery. He reported the following to [place].  sore to back of knee left leg amputated leg Symptoms: reports "sore" or abrasion to back of knee left leg amputated leg drainage clear, with odor. Denies fever. Hx diabetes. Reports he feels prosthetic limb caused area. He said hat the open area is about the size of a dime.   He said that he needs wheelchair transport because he uses his electric wheelchair for mobility.  I sent a message to [facility] Transportation services inquiring if a ride could be arranged for patient to [facility] at 1430 today.

## 2022-05-25 NOTE — Telephone Encounter (Signed)
I called the patient to inform him that we do not have any appointments available tomorrow afternoon at [facility], [facility] or [facility].  Message left with call back requested.   He may need to be referred to Urgent Care

## 2022-05-25 NOTE — Telephone Encounter (Signed)
Patient called to see if transportation had been set up so he could come in to see PCP this afternoon. Please follow up with patient.

## 2022-05-25 NOTE — Telephone Encounter (Signed)
I spoke to the patient and told him that I have not heard back from [place] for a ride today.  I will see if an  can be scheduled for tomorrow.  He said he finishes dialysis at 1100 and may be able to have [facility] drop him off at [facility]. He said he does not mind waiting for an appointment

## 2022-05-26 ENCOUNTER — Ambulatory Visit: Payer: Self-pay | Admitting: *Deleted

## 2022-05-26 DIAGNOSIS — N2581 Secondary hyperparathyroidism of renal origin: Secondary | ICD-10-CM | POA: Diagnosis not present

## 2022-05-26 DIAGNOSIS — D631 Anemia in chronic kidney disease: Secondary | ICD-10-CM | POA: Diagnosis not present

## 2022-05-26 DIAGNOSIS — Z992 Dependence on renal dialysis: Secondary | ICD-10-CM | POA: Diagnosis not present

## 2022-05-26 DIAGNOSIS — D509 Iron deficiency anemia, unspecified: Secondary | ICD-10-CM | POA: Diagnosis not present

## 2022-05-26 DIAGNOSIS — D689 Coagulation defect, unspecified: Secondary | ICD-10-CM | POA: Diagnosis not present

## 2022-05-26 DIAGNOSIS — E1122 Type 2 diabetes mellitus with diabetic chronic kidney disease: Secondary | ICD-10-CM | POA: Diagnosis not present

## 2022-05-26 DIAGNOSIS — N186 End stage renal disease: Secondary | ICD-10-CM | POA: Diagnosis not present

## 2022-05-26 NOTE — Telephone Encounter (Signed)
See addended note from 05/25/22. Instructed patient to go to UC now , to set up transportation.

## 2022-05-26 NOTE — Telephone Encounter (Signed)
Patient continues to request appt for "sore" on back of amputated leg with a smell. Reviewed note from Etter Sjogren, RN no available appt. Instructed patient to go to UC for evaluation today. Patient reports he will attempt to set up transportation.

## 2022-05-26 NOTE — Telephone Encounter (Signed)
I called the patient to inform him that there is a walk in appointment available tomorrow at [facility] and he would need to be there by 0830. We are still working on transportation.  He said that he has already scheduled a ride with [facility] for tomorrow at 1000 to take him to Urgent Care at [facility]. He stated that he put neosporin on the area and it is not " stinging " as bad as it was.

## 2022-05-27 ENCOUNTER — Ambulatory Visit
Admission: EM | Admit: 2022-05-27 | Discharge: 2022-05-27 | Disposition: A | Discharge status: Home / Self Care | Payer: Medicare Other | Attending: Internal Medicine | Admitting: Internal Medicine

## 2022-05-27 ENCOUNTER — Ambulatory Visit: Payer: Medicare Other

## 2022-05-27 DIAGNOSIS — L03116 Cellulitis of left lower limb: Secondary | ICD-10-CM | POA: Diagnosis not present

## 2022-05-27 DIAGNOSIS — Z89512 Acquired absence of left leg below knee: Secondary | ICD-10-CM | POA: Diagnosis not present

## 2022-05-27 MED ORDER — CEPHALEXIN 250 MG PO CAPS: 250.0000 mg | ORAL_CAPSULE | Freq: Two times a day (BID) | ORAL | 0 refills | Status: AC

## 2022-05-27 NOTE — ED Triage Notes (Signed)
Pt c/o a sore above BKA x2wks with drainage and odor. Hx of DM an Dialysis.

## 2022-05-27 NOTE — Discharge Instructions (Signed)
I have prescribed you an antibiotic.  Please monitor very closely for any worsening infection that include increased redness, swelling, pus.  Please make an appointment with primary care doctor for further evaluation and management.  I also recommend that you see your wound care center as well.  Please keep prosthetic off until healed over.  If you are not able to see PCP or wound care, recommend you come back to urgent care for wound check.
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
## 2022-05-27 NOTE — Telephone Encounter (Signed)
Per epic patient has been seen in ED

## 2022-05-28 ENCOUNTER — Ambulatory Visit: Payer: Self-pay

## 2022-05-28 DIAGNOSIS — N2581 Secondary hyperparathyroidism of renal origin: Secondary | ICD-10-CM | POA: Diagnosis not present

## 2022-05-28 DIAGNOSIS — E1122 Type 2 diabetes mellitus with diabetic chronic kidney disease: Secondary | ICD-10-CM | POA: Diagnosis not present

## 2022-05-28 DIAGNOSIS — D509 Iron deficiency anemia, unspecified: Secondary | ICD-10-CM | POA: Diagnosis not present

## 2022-05-28 DIAGNOSIS — N186 End stage renal disease: Secondary | ICD-10-CM | POA: Diagnosis not present

## 2022-05-28 DIAGNOSIS — B839 Helminthiasis, unspecified: Secondary | ICD-10-CM

## 2022-05-28 DIAGNOSIS — D689 Coagulation defect, unspecified: Secondary | ICD-10-CM | POA: Diagnosis not present

## 2022-05-28 DIAGNOSIS — Z992 Dependence on renal dialysis: Secondary | ICD-10-CM | POA: Diagnosis not present

## 2022-05-28 DIAGNOSIS — D631 Anemia in chronic kidney disease: Secondary | ICD-10-CM | POA: Diagnosis not present

## 2022-05-28 LAB — MULTIPLE MYELOMA PANEL, SERUM
Albumin SerPl Elph-Mcnc: 3.3 g/dL (ref 2.9–4.4)
Albumin/Glob SerPl: 1 (ref 0.7–1.7)
Alpha 1: 0.2 g/dL (ref 0.0–0.4)
Alpha2 Glob SerPl Elph-Mcnc: 0.8 g/dL (ref 0.4–1.0)
B-Globulin SerPl Elph-Mcnc: 0.8 g/dL (ref 0.7–1.3)
Gamma Glob SerPl Elph-Mcnc: 1.7 g/dL (ref 0.4–1.8)
Globulin, Total: 3.4 g/dL (ref 2.2–3.9)
IgA: 148 mg/dL (ref 61–437)
IgG (Immunoglobin G), Serum: 1524 mg/dL (ref 603–1613)
IgM (Immunoglobulin M), Srm: 409 mg/dL — ABNORMAL HIGH (ref 20–172)
M Protein SerPl Elph-Mcnc: 0.6 g/dL — ABNORMAL HIGH
Total Protein ELP: 6.7 g/dL (ref 6.0–8.5)

## 2022-05-28 NOTE — Telephone Encounter (Signed)
  Chief Complaint: Passed a worm last friday Symptoms: stomach pain Frequency: ongoing Pertinent Negatives: Patient denies  Disposition: '[]'$ ED /'[]'$ Urgent Care (no appt availability in office) / '[]'$ Appointment(In office/virtual)/ '[]'$  [facility]/ '[]'$ Home Care/ '[x]'$ Refused Recommended Disposition /'[]'$ [facility]/ '[]'$  Follow-up with PCP Additional Notes: Spoke with pt. Suggested uc. Pt stated that he has no way to get to an UC. PT stated, "that's ok. Don't worry about it", and hung up.  Please advise.  Summary: tapeworms   Pt called saying that he has passed a tape worm in his stool.  Please advise.   CB@  [phone]      Reason for Disposition  Passed a "worm" by rectum  Answer Assessment - Initial Assessment Questions 1. APPEARANCE of WORM: "What does it look like and does it move?"     Long and thin 2. SIZE: "How long is the worm?"     8 inches 3. LOCATION: "Where did it come from?" (e.g., from rectum, in stool, through mouth)     Stool 4. WHEN: "When did it happen?"     Last friday 5. OTHER SYMPTOMS: "Do you have any other symptoms?" (e.g., stomach pain, vomiting)     Stomach pain 6. PREGNANCY: "Is there any chance you are pregnant?" "When was your last menstrual period?"     na 7. TRAVEL: "Have you traveled internationally in the last 2 to 3 months?"     no  Protocols used: Worms - Other Than Pinworms-A-AH

## 2022-05-28 NOTE — Telephone Encounter (Signed)
Call placed to patient and VM was left informing patient to return phone call. 

## 2022-05-28 NOTE — Addendum Note (Signed)
Addended by: Karle Plumber B on: 05/28/2022 01:43 PM   Modules accepted: Orders

## 2022-05-28 NOTE — Telephone Encounter (Signed)
Routing to PCP for review.

## 2022-05-29 DIAGNOSIS — Z992 Dependence on renal dialysis: Secondary | ICD-10-CM | POA: Diagnosis not present

## 2022-05-29 DIAGNOSIS — N186 End stage renal disease: Secondary | ICD-10-CM | POA: Diagnosis not present

## 2022-05-29 DIAGNOSIS — E1129 Type 2 diabetes mellitus with other diabetic kidney complication: Secondary | ICD-10-CM | POA: Diagnosis not present

## 2022-05-31 DIAGNOSIS — D689 Coagulation defect, unspecified: Secondary | ICD-10-CM | POA: Diagnosis not present

## 2022-05-31 DIAGNOSIS — D631 Anemia in chronic kidney disease: Secondary | ICD-10-CM | POA: Diagnosis not present

## 2022-05-31 DIAGNOSIS — D509 Iron deficiency anemia, unspecified: Secondary | ICD-10-CM | POA: Diagnosis not present

## 2022-05-31 DIAGNOSIS — N2581 Secondary hyperparathyroidism of renal origin: Secondary | ICD-10-CM | POA: Diagnosis not present

## 2022-05-31 DIAGNOSIS — N186 End stage renal disease: Secondary | ICD-10-CM | POA: Diagnosis not present

## 2022-05-31 DIAGNOSIS — Z992 Dependence on renal dialysis: Secondary | ICD-10-CM | POA: Diagnosis not present

## 2022-05-31 DIAGNOSIS — E1122 Type 2 diabetes mellitus with diabetic chronic kidney disease: Secondary | ICD-10-CM | POA: Diagnosis not present

## 2022-06-02 DIAGNOSIS — E1122 Type 2 diabetes mellitus with diabetic chronic kidney disease: Secondary | ICD-10-CM | POA: Diagnosis not present

## 2022-06-02 DIAGNOSIS — N186 End stage renal disease: Secondary | ICD-10-CM | POA: Diagnosis not present

## 2022-06-02 DIAGNOSIS — D689 Coagulation defect, unspecified: Secondary | ICD-10-CM | POA: Diagnosis not present

## 2022-06-02 DIAGNOSIS — D509 Iron deficiency anemia, unspecified: Secondary | ICD-10-CM | POA: Diagnosis not present

## 2022-06-02 DIAGNOSIS — D631 Anemia in chronic kidney disease: Secondary | ICD-10-CM | POA: Diagnosis not present

## 2022-06-02 DIAGNOSIS — Z992 Dependence on renal dialysis: Secondary | ICD-10-CM | POA: Diagnosis not present

## 2022-06-02 DIAGNOSIS — N2581 Secondary hyperparathyroidism of renal origin: Secondary | ICD-10-CM | POA: Diagnosis not present

## 2022-06-04 DIAGNOSIS — E1122 Type 2 diabetes mellitus with diabetic chronic kidney disease: Secondary | ICD-10-CM | POA: Diagnosis not present

## 2022-06-04 DIAGNOSIS — D509 Iron deficiency anemia, unspecified: Secondary | ICD-10-CM | POA: Diagnosis not present

## 2022-06-04 DIAGNOSIS — D689 Coagulation defect, unspecified: Secondary | ICD-10-CM | POA: Diagnosis not present

## 2022-06-04 DIAGNOSIS — Z992 Dependence on renal dialysis: Secondary | ICD-10-CM | POA: Diagnosis not present

## 2022-06-04 DIAGNOSIS — N186 End stage renal disease: Secondary | ICD-10-CM | POA: Diagnosis not present

## 2022-06-04 DIAGNOSIS — N2581 Secondary hyperparathyroidism of renal origin: Secondary | ICD-10-CM | POA: Diagnosis not present

## 2022-06-04 DIAGNOSIS — D631 Anemia in chronic kidney disease: Secondary | ICD-10-CM | POA: Diagnosis not present

## 2022-06-07 DIAGNOSIS — D689 Coagulation defect, unspecified: Secondary | ICD-10-CM | POA: Diagnosis not present

## 2022-06-07 DIAGNOSIS — D631 Anemia in chronic kidney disease: Secondary | ICD-10-CM | POA: Diagnosis not present

## 2022-06-07 DIAGNOSIS — Z992 Dependence on renal dialysis: Secondary | ICD-10-CM | POA: Diagnosis not present

## 2022-06-07 DIAGNOSIS — D509 Iron deficiency anemia, unspecified: Secondary | ICD-10-CM | POA: Diagnosis not present

## 2022-06-07 DIAGNOSIS — E1122 Type 2 diabetes mellitus with diabetic chronic kidney disease: Secondary | ICD-10-CM | POA: Diagnosis not present

## 2022-06-07 DIAGNOSIS — N186 End stage renal disease: Secondary | ICD-10-CM | POA: Diagnosis not present

## 2022-06-07 DIAGNOSIS — N2581 Secondary hyperparathyroidism of renal origin: Secondary | ICD-10-CM | POA: Diagnosis not present

## 2022-06-09 DIAGNOSIS — D689 Coagulation defect, unspecified: Secondary | ICD-10-CM | POA: Diagnosis not present

## 2022-06-09 DIAGNOSIS — D631 Anemia in chronic kidney disease: Secondary | ICD-10-CM | POA: Diagnosis not present

## 2022-06-09 DIAGNOSIS — N186 End stage renal disease: Secondary | ICD-10-CM | POA: Diagnosis not present

## 2022-06-09 DIAGNOSIS — N2581 Secondary hyperparathyroidism of renal origin: Secondary | ICD-10-CM | POA: Diagnosis not present

## 2022-06-09 DIAGNOSIS — E1122 Type 2 diabetes mellitus with diabetic chronic kidney disease: Secondary | ICD-10-CM | POA: Diagnosis not present

## 2022-06-09 DIAGNOSIS — Z992 Dependence on renal dialysis: Secondary | ICD-10-CM | POA: Diagnosis not present

## 2022-06-09 DIAGNOSIS — D509 Iron deficiency anemia, unspecified: Secondary | ICD-10-CM | POA: Diagnosis not present

## 2022-06-11 DIAGNOSIS — D509 Iron deficiency anemia, unspecified: Secondary | ICD-10-CM | POA: Diagnosis not present

## 2022-06-11 DIAGNOSIS — N186 End stage renal disease: Secondary | ICD-10-CM | POA: Diagnosis not present

## 2022-06-11 DIAGNOSIS — Z992 Dependence on renal dialysis: Secondary | ICD-10-CM | POA: Diagnosis not present

## 2022-06-11 DIAGNOSIS — D689 Coagulation defect, unspecified: Secondary | ICD-10-CM | POA: Diagnosis not present

## 2022-06-11 DIAGNOSIS — N2581 Secondary hyperparathyroidism of renal origin: Secondary | ICD-10-CM | POA: Diagnosis not present

## 2022-06-11 DIAGNOSIS — E1122 Type 2 diabetes mellitus with diabetic chronic kidney disease: Secondary | ICD-10-CM | POA: Diagnosis not present

## 2022-06-11 DIAGNOSIS — D631 Anemia in chronic kidney disease: Secondary | ICD-10-CM | POA: Diagnosis not present

## 2022-06-14 DIAGNOSIS — D689 Coagulation defect, unspecified: Secondary | ICD-10-CM | POA: Diagnosis not present

## 2022-06-14 DIAGNOSIS — N2581 Secondary hyperparathyroidism of renal origin: Secondary | ICD-10-CM | POA: Diagnosis not present

## 2022-06-14 DIAGNOSIS — E1122 Type 2 diabetes mellitus with diabetic chronic kidney disease: Secondary | ICD-10-CM | POA: Diagnosis not present

## 2022-06-14 DIAGNOSIS — Z992 Dependence on renal dialysis: Secondary | ICD-10-CM | POA: Diagnosis not present

## 2022-06-14 DIAGNOSIS — D509 Iron deficiency anemia, unspecified: Secondary | ICD-10-CM | POA: Diagnosis not present

## 2022-06-14 DIAGNOSIS — D631 Anemia in chronic kidney disease: Secondary | ICD-10-CM | POA: Diagnosis not present

## 2022-06-14 DIAGNOSIS — N186 End stage renal disease: Secondary | ICD-10-CM | POA: Diagnosis not present

## 2022-06-16 DIAGNOSIS — Z992 Dependence on renal dialysis: Secondary | ICD-10-CM | POA: Diagnosis not present

## 2022-06-16 DIAGNOSIS — N186 End stage renal disease: Secondary | ICD-10-CM | POA: Diagnosis not present

## 2022-06-16 DIAGNOSIS — D631 Anemia in chronic kidney disease: Secondary | ICD-10-CM | POA: Diagnosis not present

## 2022-06-16 DIAGNOSIS — D689 Coagulation defect, unspecified: Secondary | ICD-10-CM | POA: Diagnosis not present

## 2022-06-16 DIAGNOSIS — E1122 Type 2 diabetes mellitus with diabetic chronic kidney disease: Secondary | ICD-10-CM | POA: Diagnosis not present

## 2022-06-16 DIAGNOSIS — D509 Iron deficiency anemia, unspecified: Secondary | ICD-10-CM | POA: Diagnosis not present

## 2022-06-16 DIAGNOSIS — N2581 Secondary hyperparathyroidism of renal origin: Secondary | ICD-10-CM | POA: Diagnosis not present

## 2022-06-17 DIAGNOSIS — L0390 Cellulitis, unspecified: Secondary | ICD-10-CM | POA: Diagnosis not present

## 2022-06-17 DIAGNOSIS — M79605 Pain in left leg: Secondary | ICD-10-CM | POA: Diagnosis not present

## 2022-06-17 DIAGNOSIS — A419 Sepsis, unspecified organism: Secondary | ICD-10-CM | POA: Diagnosis not present

## 2022-06-17 DIAGNOSIS — M726 Necrotizing fasciitis: Secondary | ICD-10-CM | POA: Diagnosis not present

## 2022-06-17 DIAGNOSIS — S81801A Unspecified open wound, right lower leg, initial encounter: Secondary | ICD-10-CM | POA: Diagnosis not present

## 2022-06-18 DIAGNOSIS — N186 End stage renal disease: Secondary | ICD-10-CM | POA: Diagnosis not present

## 2022-06-18 DIAGNOSIS — Z992 Dependence on renal dialysis: Secondary | ICD-10-CM | POA: Diagnosis not present

## 2022-06-18 DIAGNOSIS — E1122 Type 2 diabetes mellitus with diabetic chronic kidney disease: Secondary | ICD-10-CM | POA: Diagnosis not present

## 2022-06-18 DIAGNOSIS — N2581 Secondary hyperparathyroidism of renal origin: Secondary | ICD-10-CM | POA: Diagnosis not present

## 2022-06-18 DIAGNOSIS — D631 Anemia in chronic kidney disease: Secondary | ICD-10-CM | POA: Diagnosis not present

## 2022-06-18 DIAGNOSIS — D689 Coagulation defect, unspecified: Secondary | ICD-10-CM | POA: Diagnosis not present

## 2022-06-18 DIAGNOSIS — D509 Iron deficiency anemia, unspecified: Secondary | ICD-10-CM | POA: Diagnosis not present

## 2022-06-21 ENCOUNTER — Telehealth: Payer: Self-pay | Admitting: Physician Assistant

## 2022-06-21 ENCOUNTER — Telehealth: Payer: Medicare Other | Admitting: Physician Assistant

## 2022-06-21 ENCOUNTER — Encounter: Payer: Self-pay | Admitting: Physician Assistant

## 2022-06-21 ENCOUNTER — Ambulatory Visit: Payer: Self-pay

## 2022-06-21 DIAGNOSIS — D689 Coagulation defect, unspecified: Secondary | ICD-10-CM | POA: Diagnosis not present

## 2022-06-21 DIAGNOSIS — D509 Iron deficiency anemia, unspecified: Secondary | ICD-10-CM | POA: Diagnosis not present

## 2022-06-21 DIAGNOSIS — Z992 Dependence on renal dialysis: Secondary | ICD-10-CM | POA: Diagnosis not present

## 2022-06-21 DIAGNOSIS — L089 Local infection of the skin and subcutaneous tissue, unspecified: Secondary | ICD-10-CM

## 2022-06-21 DIAGNOSIS — N186 End stage renal disease: Secondary | ICD-10-CM | POA: Diagnosis not present

## 2022-06-21 DIAGNOSIS — D631 Anemia in chronic kidney disease: Secondary | ICD-10-CM | POA: Diagnosis not present

## 2022-06-21 DIAGNOSIS — T148XXA Other injury of unspecified body region, initial encounter: Secondary | ICD-10-CM

## 2022-06-21 DIAGNOSIS — E1122 Type 2 diabetes mellitus with diabetic chronic kidney disease: Secondary | ICD-10-CM | POA: Diagnosis not present

## 2022-06-21 DIAGNOSIS — N2581 Secondary hyperparathyroidism of renal origin: Secondary | ICD-10-CM | POA: Diagnosis not present

## 2022-06-21 MED ORDER — DOXYCYCLINE HYCLATE 100 MG PO TABS: 100.0000 mg | ORAL_TABLET | Freq: Two times a day (BID) | ORAL | 0 refills | Status: DC

## 2022-06-21 NOTE — Progress Notes (Signed)
Virtual Visit Consent   Mark [name], you are scheduled for a virtual visit with a [facility] provider today. Just as with appointments in the office, your consent must be obtained to participate. Your consent will be active for this visit and any virtual visit you may have with one of our providers in the next 365 days. If you have a MyChart account, a copy of this consent can be sent to you electronically.  As this is a virtual visit, video technology does not allow for your provider to perform a traditional examination. This may limit your provider's ability to fully assess your condition. If your provider identifies any concerns that need to be evaluated in person or the need to arrange testing (such as labs, EKG, etc.), we will make arrangements to do so. Although advances in technology are sophisticated, we cannot ensure that it will always work on either your end or our end. If the connection with a video visit is poor, the visit may have to be switched to a telephone visit. With either a video or telephone visit, we are not always able to ensure that we have a secure connection.  By engaging in this virtual visit, you consent to the provision of healthcare and authorize for your insurance to be billed (if applicable) for the services provided during this visit. Depending on your insurance coverage, you may receive a charge related to this service.  I need to obtain your verbal consent now. Are you willing to proceed with your visit today? Mark [name] has provided verbal consent on 06/21/2022 for a virtual visit (video or telephone). Mark [name], [place]  Date: 06/21/2022 5:31 PM  Virtual Visit via Video Note   I, Mark [name], connected with  Arish Redner  ([id number], 02-09-1951) on 06/21/22 at  5:30 PM EDT by a video-enabled telemedicine application and verified that I am speaking with the correct person using two identifiers.  Location: Patient: Virtual Visit Location  Patient: Home Provider: Virtual Visit Location Provider: Home Office   I discussed the limitations of evaluation and management by telemedicine and the availability of in person appointments. The patient expressed understanding and agreed to proceed.    History of Present Illness: Mark [name] is a 71 y.o. who identifies as a male who was assigned male at birth, and is being seen today for infection of left lower extremity at site of BKA where his prosthetic leg sits. Notes has some irritation that comes and goes but over past two weeks noting persistent irritation, now with redness, tenderness and drainage from the skin. Notes his prosthetic is poorly fitting. Is waiting to get it adjusted. Notes having some sweating last night so possible fever but has broken. No fever today. Denies malaise or substantial fatigue. Has ESRD on dialysis M,W,F. Did not let nurse look at area today while at dialysis. Called PCP office but was triaged for a virtual UC visit.   Observations/Objective: Patient is well-developed, well-nourished in no acute distress.  Resting comfortably at home.  Head is normocephalic, atraumatic.  No labored breathing. Speech is clear and coherent with logical content.  Patient is alert and oriented at baseline.  Unable to visualize wound due to patient being alone and unable to maneuver his phone. Tried to upload pictures to [place] but could not.   Assessment and Plan: 1. Wound infection On Dialysis, last this morning. Supportive measures discussed. Will start Doxycycline for him to take as directed for suspected infection but he needs in-person  evaluation due to risks and inability to properly visualize area. He is to call PCP office in the morning to schedule follow-up in office. ER precautions reviewed.   - doxycycline (VIBRA-TABS) 100 MG tablet; Take 1 tablet (100 mg total) by mouth 2 (two) times daily.  Dispense: 14 tablet; Refill: 0   Follow Up Instructions: I discussed  the assessment and treatment plan with the patient. The patient was provided an opportunity to ask questions and all were answered. The patient agreed with the plan and demonstrated an understanding of the instructions.  A copy of instructions were sent to the patient via MyChart unless otherwise noted below.   The patient was advised to call back or seek an in-person evaluation if the symptoms worsen or if the condition fails to improve as anticipated.  Time:  I spent 10 minutes with the patient via telehealth technology discussing the above problems/concerns.    Mark Rio, PA-C

## 2022-06-21 NOTE — Patient Instructions (Signed)
[name], thank you for joining [name], PA-C for today's virtual visit.  While this provider is not your primary care provider (PCP), if your PCP is located in our provider database this encounter information will be shared with them immediately following your visit.   [facility] account gives you access to today's visit and all your visits, tests, and labs performed at [facility] " click here if you don't have a [facility] [place] account or go to [email or web address]  Consent: (Patient) [name] provided verbal consent for this virtual visit at the beginning of the encounter.  Current Medications:  Current Outpatient Medications:    doxycycline (VIBRA-TABS) 100 MG tablet, Take 1 tablet (100 mg total) by mouth 2 (two) times daily., Disp: 14 tablet, Rfl: 0   acetaminophen (TYLENOL) 650 MG CR tablet, Take 1 tablet (650 mg total) by mouth 2 (two) times daily as needed for pain. (Patient taking differently: Take 1,300 mg by mouth as needed for pain.), Disp: 60 tablet, Rfl: 1   atorvastatin (LIPITOR) 20 MG tablet, Take 1 tablet (20 mg total) by mouth daily., Disp: 90 tablet, Rfl: 1   Blood Glucose Monitoring Suppl (ONETOUCH VERIO) w/Device KIT, Use as directed to test blood sugar three times daily., Disp: 1 kit, Rfl: 0   carvedilol (COREG) 25 MG tablet, Take 1 tablet (25 mg total) by mouth 2 (two) times daily., Disp: 180 tablet, Rfl: 3   furosemide (LASIX) 80 MG tablet, Take 1 tablet (80 mg total) by mouth daily., Disp: 90 tablet, Rfl: 3   glucose blood (ONETOUCH VERIO) test strip, USE AS DIRECTED THREE TIMES DAILY TO  TEST  BLOOD  SUGAR, Disp: 100 each, Rfl: 11   HYDROcodone-acetaminophen (NORCO/VICODIN) 5-325 MG tablet, Take 1 tablet by mouth every 4 (four) hours as needed. (Patient not taking: Reported on 05/12/2022), Disp: 10 tablet, Rfl: 0   insulin glargine (LANTUS SOLOSTAR) 100 UNIT/ML Solostar Pen, Inject 20 Units into the skin at  bedtime., Disp: 15 mL, Rfl: 3   Insulin Pen Needle (TRUEPLUS 5-BEVEL PEN NEEDLES) 32G X 4 MM MISC, Use to administer Lantus once daily., Disp: 100 each, Rfl: 2   Insulin Syringe-Needle U-100 (INSULIN SYRINGE .5CC/30GX5/16") 30G X 5/16" 0.5 ML MISC, Check blood sugar TID & QHS, Disp: 100 each, Rfl: 2   lanthanum (FOSRENOL) 1000 MG chewable tablet, Chew 1,000 mg by mouth in the morning, at noon, in the evening, and at bedtime., Disp: , Rfl:    midodrine (PROAMATINE) 5 MG tablet, Take 5 mg by mouth daily. Do not taking if BP is low., Disp: , Rfl:    Multiple Vitamin (MULTIVITAMIN WITH MINERALS) TABS tablet, Take 1 tablet by mouth daily., Disp: , Rfl:    OneTouch Delica Lancets 34H MISC, Use as directed to test blood sugar three times daily., Disp: 100 each, Rfl: 12   OVER THE COUNTER MEDICATION, Take 1 tablet by mouth daily. Mega Man, Disp: , Rfl:    VITAMIN D PO, Take 1 capsule by mouth in the morning and at bedtime., Disp: , Rfl:    Medications ordered in this encounter:  Meds ordered this encounter  Medications   doxycycline (VIBRA-TABS) 100 MG tablet    Sig: Take 1 tablet (100 mg total) by mouth 2 (two) times daily.    Dispense:  14 tablet    Refill:  0    Order Specific Question:   Supervising Provider    Answer:   Chase Picket [id number]     *  If you need refills on other medications prior to your next appointment, please contact your pharmacy*  Follow-Up: Call back or seek an in-person evaluation if the symptoms worsen or if the condition fails to improve as anticipated.  [place] [phone]  Other Instructions Please take medication as directed. Keep skin clean and dry. Contact your PCP office first thing in the morning for in-person evaluation. If unable to see you within 48 hours, you need to be seen at urgent care. If any recurrence of fever or worsening symptoms before evaluation with PCP, please be seen at nearest ER.     If you have been  instructed to have an in-person evaluation today at a local Urgent Care facility, please use the link below. It will take you to a list of all of our available [facility] [place] [facility], including address, phone number and hours of operation. Please do not delay care.  [facility]  If you or a family member do not have a primary care provider, use the link below to schedule a visit and establish care. When you choose a [facility] primary care physician or advanced practice provider, you gain a long-term partner in health. Find a Primary Care Provider  Learn more about 's in-office and virtual care options: [facility]

## 2022-06-21 NOTE — Progress Notes (Signed)
Appt scheduled for patient by triage nurse but was placed in a new chart for patient instead of his existing chart with correct spelling of his last name. Admin made aware so charts could be marked for merge/deletion. Will complete documentation for visit in patient's existing chart for continuity of care.

## 2022-06-21 NOTE — Telephone Encounter (Signed)
  Chief Complaint: Infection in left leg Symptoms: infection Frequency: ongoing Pertinent Negatives: Patient denies  Disposition: '[]'$ ED /'[]'$ Urgent Care (no appt availability in office) / '[]'$ Appointment(In office/virtual)/ '[x]'$  [facility]/ '[]'$ Home Care/ '[]'$ Refused Recommended Disposition /'[]'$ [place] [facility]/ '[]'$  Follow-up with PCP Additional Notes: PT called wanting antibiotics for leg wound where leg and prosthetic meet. Wound has been there awhile and did not fully heal. Pain is 10/10 pt has fever, and chills. Wound is red pus filled and hot. Pt refuses UC as he does not have a way to get there and does not want to sit at the ED for hours.     Reason for Disposition  [1] Looks infected (spreading redness, red streak, pus) AND [2] fever  Answer Assessment - Initial Assessment Questions 1. LOCATION: "Where is the wound located?"      Left leg - behind the knee where prosthetic stops 2. WOUND APPEARANCE: "What does the wound look like?"      White 3. SIZE: If redness is present, ask: "What is the size of the red area?" (Inches, centimeters, or compare to size of a coin)      yes 4. SPREAD: "What's changed in the last day?"  "Do you see any red streaks coming from the wound?"     no 5. ONSET: "When did it start to look infected?"      Yes - pus 6. MECHANISM: "How did the wound start, what was the cause?"     rubbing 7. PAIN: "Is there any pain?" If Yes, ask: "How bad is the pain?"   (Scale 1-10; or mild, moderate, severe)     10/10 8. FEVER: "Do you have a fever?" If Yes, ask: "What is your temperature, how was it measured, and when did it start?"     Yes - past 2 days 9. OTHER SYMPTOMS: "Do you have any other symptoms?" (e.g., shaking chills, weakness, rash elsewhere on body)     chills 10. PREGNANCY: "Is there any chance you are pregnant?" "When was your last menstrual period?"  Protocols used: Wound Infection-A-AH

## 2022-06-21 NOTE — Telephone Encounter (Signed)
Patient had a video visit today with [facility] provider.  He was started on ATB. Has an apt scheduled in November.

## 2022-06-22 ENCOUNTER — Telehealth: Payer: Self-pay | Admitting: Internal Medicine

## 2022-06-22 ENCOUNTER — Ambulatory Visit: Payer: Medicare Other | Admitting: Orthopedic Surgery

## 2022-06-22 MED ORDER — DOXYCYCLINE HYCLATE 100 MG PO TABS: 100.0000 mg | ORAL_TABLET | Freq: Two times a day (BID) | ORAL | 0 refills | Status: DC

## 2022-06-22 NOTE — Telephone Encounter (Signed)
Attempt to reach out to patient regarding appt.  Out of no where, patient states he is tired of speaking to [place]. He did not want to hear from [place] and repeated that he was tired of [place].  He attempted to hang up but had difficulty with disconnecting the call.   Called patient back twice to get understanding and to see how to help him. Advised him that there was a sooner apt on Friday with Dr. Wynetta Emery. He states he is not able to come on Fridays due to having dialysis. M, W, F are his dialysis days.   He states he is not able to get his ATB that prescribed on yesterday until the November. He stated he will figure it out and raise the money himself and call was disconnected.   Rio to gain insight on his ATB. I was informed since Rx was sent to [facility] initially, that pharmacy would have to "put back" so it can be filled.   Called [facility]

## 2022-06-22 NOTE — Telephone Encounter (Signed)
Patient called and verified the correct pharmacy to send the doxycycline to. Elyn Aquas, PA-C who evaluated him via virtual UC on 06/21/22 was sent a message and he says he will send in the medication to the patient's preferred pharmacy [facility]. Chart updated. Patient advised he will need to scheduled a f/u appointment with PCP to evaluate the effectiveness of the antibiotic for the wound infection on the leg. No available appointments until Nov 21, so advised patient I will send this to Dr. Wynetta Emery and someone will call with a sooner appointment. He says he will need to know in advance of at least 24 hours so he can arrange transportation.

## 2022-06-22 NOTE — Addendum Note (Signed)
Addended by: Carilyn Goodpasture on: 06/22/2022 04:23 PM   Modules accepted: Orders

## 2022-06-22 NOTE — Telephone Encounter (Signed)
As mentioned in previous note. Patient has apt on 06/30/2022 with another provider in the office. Can addres at that [place].

## 2022-06-22 NOTE — Addendum Note (Signed)
Addended by: Brunetta Jeans on: 06/22/2022 10:29 AM   Modules accepted: Orders

## 2022-06-22 NOTE — Telephone Encounter (Signed)
Medication Refill - Medication: doxycycline (VIBRA-TABS) 100 MG tablet.   Pt called in upset and stated he was at the pharmacy and his medication was not there. Pt is possibly referring to the medication doxycycline (VIBRA-TABS) 100 MG tablet. I asked pt to verify the pharmacy. Pt stated that was the wrong pharmacy. Pt began cursing and yelling, saying it should be [facility]. Pt mentioned telling provider multiple times to not send to [place], [place] [address] tried to explain it was the only pharmacy on file. Pt upset asked me to transfer medication and disconnected the call.  I was unable to get information for which [facility] he needed medication transferred too.  Has the patient contacted their pharmacy? Yes.    (Agent: If yes, when and what did the pharmacy advise?)  Preferred Pharmacy (with phone number or street name):  Has the patient been seen for an appointment in the last year OR does the patient have an upcoming appointment? Yes.    Agent: Please be advised that RX refills may take up to 3 business days. We ask that you follow-up with your pharmacy.

## 2022-06-23 DIAGNOSIS — D689 Coagulation defect, unspecified: Secondary | ICD-10-CM | POA: Diagnosis not present

## 2022-06-23 DIAGNOSIS — N186 End stage renal disease: Secondary | ICD-10-CM | POA: Diagnosis not present

## 2022-06-23 DIAGNOSIS — N2581 Secondary hyperparathyroidism of renal origin: Secondary | ICD-10-CM | POA: Diagnosis not present

## 2022-06-23 DIAGNOSIS — Z992 Dependence on renal dialysis: Secondary | ICD-10-CM | POA: Diagnosis not present

## 2022-06-23 DIAGNOSIS — D509 Iron deficiency anemia, unspecified: Secondary | ICD-10-CM | POA: Diagnosis not present

## 2022-06-23 DIAGNOSIS — D631 Anemia in chronic kidney disease: Secondary | ICD-10-CM | POA: Diagnosis not present

## 2022-06-23 DIAGNOSIS — E1122 Type 2 diabetes mellitus with diabetic chronic kidney disease: Secondary | ICD-10-CM | POA: Diagnosis not present

## 2022-06-23 NOTE — Telephone Encounter (Signed)
Pt called back requesting a more affordable abx, please advise

## 2022-06-24 NOTE — Addendum Note (Signed)
Addended by: Carilyn Goodpasture on: 06/24/2022 02:33 PM   Modules accepted: Orders

## 2022-06-25 ENCOUNTER — Ambulatory Visit: Payer: Self-pay | Admitting: Internal Medicine

## 2022-06-28 ENCOUNTER — Other Ambulatory Visit: Payer: Self-pay

## 2022-06-28 ENCOUNTER — Encounter (HOSPITAL_COMMUNITY): Payer: Self-pay | Admitting: Emergency Medicine

## 2022-06-28 ENCOUNTER — Emergency Department (HOSPITAL_COMMUNITY)
Admission: EM | Admit: 2022-06-28 | Discharge: 2022-06-29 | Disposition: A | Discharge status: Home / Self Care | Payer: Medicare Other | Attending: Emergency Medicine | Admitting: Emergency Medicine

## 2022-06-28 DIAGNOSIS — Z743 Need for continuous supervision: Secondary | ICD-10-CM | POA: Diagnosis not present

## 2022-06-28 DIAGNOSIS — N186 End stage renal disease: Secondary | ICD-10-CM | POA: Diagnosis not present

## 2022-06-28 DIAGNOSIS — Z794 Long term (current) use of insulin: Secondary | ICD-10-CM | POA: Diagnosis not present

## 2022-06-28 DIAGNOSIS — D689 Coagulation defect, unspecified: Secondary | ICD-10-CM | POA: Diagnosis not present

## 2022-06-28 DIAGNOSIS — N189 Chronic kidney disease, unspecified: Secondary | ICD-10-CM | POA: Insufficient documentation

## 2022-06-28 DIAGNOSIS — D631 Anemia in chronic kidney disease: Secondary | ICD-10-CM | POA: Diagnosis not present

## 2022-06-28 DIAGNOSIS — L03116 Cellulitis of left lower limb: Secondary | ICD-10-CM | POA: Diagnosis not present

## 2022-06-28 DIAGNOSIS — L0390 Cellulitis, unspecified: Secondary | ICD-10-CM

## 2022-06-28 DIAGNOSIS — N2581 Secondary hyperparathyroidism of renal origin: Secondary | ICD-10-CM | POA: Diagnosis not present

## 2022-06-28 DIAGNOSIS — Z992 Dependence on renal dialysis: Secondary | ICD-10-CM | POA: Diagnosis not present

## 2022-06-28 DIAGNOSIS — D509 Iron deficiency anemia, unspecified: Secondary | ICD-10-CM | POA: Diagnosis not present

## 2022-06-28 DIAGNOSIS — G8929 Other chronic pain: Secondary | ICD-10-CM | POA: Diagnosis not present

## 2022-06-28 DIAGNOSIS — E1122 Type 2 diabetes mellitus with diabetic chronic kidney disease: Secondary | ICD-10-CM | POA: Insufficient documentation

## 2022-06-28 MED ORDER — DOXYCYCLINE HYCLATE 100 MG PO CAPS: 100.0000 mg | ORAL_CAPSULE | Freq: Two times a day (BID) | ORAL | 0 refills | Status: AC

## 2022-06-28 MED ORDER — DOXYCYCLINE HYCLATE 100 MG PO TABS
100.0000 mg | ORAL_TABLET | Freq: Once | ORAL | Status: AC
  Administered 2022-06-28: 100 mg via ORAL
  Filled 2022-06-28: qty 1

## 2022-06-28 MED ORDER — BACITRACIN ZINC 500 UNIT/GM EX OINT: 1.0000 | TOPICAL_OINTMENT | Freq: Two times a day (BID) | CUTANEOUS | 0 refills | Status: AC

## 2022-06-28 MED ORDER — BACITRACIN ZINC 500 UNIT/GM EX OINT
TOPICAL_OINTMENT | Freq: Once | CUTANEOUS | Status: AC
  Administered 2022-06-28: 1 via TOPICAL
  Filled 2022-06-28: qty 0.9

## 2022-06-28 NOTE — ED Triage Notes (Signed)
Pt BIB EMS from home with c/o redness where prosthetic is, pt has left BKA. Was prescribed antibiotics but stated they were expensive and did not get them.

## 2022-06-28 NOTE — ED Provider Notes (Signed)
Mark [name] Provider Note   CSN: [id number] Arrival date & time: 06/28/22  2040     History  Chief Complaint  Patient presents with   Cellulitis    Mark [name] is a 71 y.o. male.  Patient here with wound to his left leg BKA that he suspect this is from his prosthetic.  He was prescribed an antibiotic last week with his primary care doctor but did not know where to pick this up.  He denies any fevers or chills.  He has a history of chronic kidney disease.  He denies any nausea, vomiting, diarrhea.  Nothing makes it worse or better.  States that he does have some irritation but does not use any padding.  Is a diabetic.  The history is provided by the patient.       Home Medications Prior to Admission medications   Medication Sig Start Date End Date Taking? Authorizing Provider  bacitracin ointment Apply 1 Application topically 2 (two) times daily. 06/28/22  Yes Seferino Oscar, DO  doxycycline (VIBRAMYCIN) 100 MG capsule Take 1 capsule (100 mg total) by mouth 2 (two) times daily. 06/28/22  Yes Roe Koffman, DO  acetaminophen (TYLENOL) 650 MG CR tablet Take 1 tablet (650 mg total) by mouth 2 (two) times daily as needed for pain. Patient taking differently: Take 1,300 mg by mouth as needed for pain. 12/11/21   [name], MD  atorvastatin (LIPITOR) 20 MG tablet Take 1 tablet (20 mg total) by mouth daily. 02/05/21   [name], MD  Blood Glucose Monitoring Suppl (ONETOUCH VERIO) w/Device KIT Use as directed to test blood sugar three times daily. 03/23/19   [name], MD  carvedilol (COREG) 25 MG tablet Take 1 tablet (25 mg total) by mouth 2 (two) times daily. 02/02/22   [name], MD  carvedilol (COREG) 25 MG tablet Take 25 mg by mouth 2 (two) times daily. 04/29/22   [provider]  furosemide (LASIX) 80 MG tablet Take 1 tablet (80 mg total) by mouth daily. 02/02/22   [name], MD  furosemide (LASIX)  80 MG tablet Take 80 mg by mouth daily. 04/29/22   [provider]  glucose blood (ONETOUCH VERIO) test strip USE AS DIRECTED THREE TIMES DAILY TO  TEST  BLOOD  SUGAR 03/15/21   [name], MD  HYDROcodone-acetaminophen (NORCO/VICODIN) 5-325 MG tablet Take 1 tablet by mouth every 4 (four) hours as needed. Patient not taking: Reported on 05/12/2022 05/03/22   Isla Pence, MD  insulin glargine (LANTUS SOLOSTAR) 100 UNIT/ML Solostar Pen Inject 20 Units into the skin at bedtime. 09/21/21   [name], MD  Insulin Pen Needle (TRUEPLUS 5-BEVEL PEN NEEDLES) 32G X 4 MM MISC Use to administer Lantus once daily. 02/19/21   [name], MD  Insulin Syringe-Needle U-100 (INSULIN SYRINGE .5CC/30GX5/16") 30G X 5/16" 0.5 ML MISC Check blood sugar TID & QHS 10/30/14   Advani, Vernon Prey, MD  lanthanum (FOSRENOL) 1000 MG chewable tablet Chew 1,000 mg by mouth in the morning, at noon, in the evening, and at bedtime. 07/27/21   [provider]  lanthanum (FOSRENOL) 1000 MG chewable tablet Chew 1,000 mg by mouth 4 (four) times daily. 05/26/22   [provider]  midodrine (PROAMATINE) 5 MG tablet Take 5 mg by mouth daily. Do not taking if BP is low. 04/04/21   [provider]  Multiple Vitamin (MULTIVITAMIN WITH MINERALS) TABS tablet Take 1 tablet by  mouth daily.    [provider]  OneTouch Delica Lancets 93X MISC Use as directed to test blood sugar three times daily. 06/17/21   Charlott Rakes, MD  OVER THE COUNTER MEDICATION Take 1 tablet by mouth daily. Mega Man    [provider]  VITAMIN D PO Take 1 capsule by mouth in the morning and at bedtime.    [provider]      Allergies    Oxycodone    Review of Systems   Review of Systems  Physical Exam Updated Vital Signs BP 124/75   Pulse 72   Temp 98.7 F (37.1 C)   Resp 17   Ht _0  (1.88 m)   Wt 97.3 kg   SpO2 95%   BMI 27.54 kg/m  Physical Exam Vitals and nursing note  reviewed.  Constitutional:      General: He is not in acute distress.    Appearance: He is well-developed.  HENT:     Head: Normocephalic and atraumatic.  Eyes:     Conjunctiva/sclera: Conjunctivae normal.  Cardiovascular:     Rate and Rhythm: Normal rate and regular rhythm.     Pulses: Normal pulses.     Heart sounds: No murmur heard. Pulmonary:     Effort: Pulmonary effort is normal. No respiratory distress.     Breath sounds: Normal breath sounds.  Abdominal:     Palpations: Abdomen is soft.     Tenderness: There is no abdominal tenderness.  Musculoskeletal:        General: No swelling.     Cervical back: Neck supple.  Skin:    General: Skin is warm and dry.     Capillary Refill: Capillary refill takes less than 2 seconds.     Comments: He has some skin breakdown behind the left knee with some mild redness around this area  Neurological:     Mental Status: He is alert.  Psychiatric:        Mood and Affect: Mood normal.     ED Results / Procedures / Treatments   Labs (all labs ordered are listed, but only abnormal results are displayed) Labs Reviewed - No data to display  EKG None  Radiology No results found.  Procedures Procedures    Medications Ordered in ED Medications  doxycycline (VIBRA-TABS) tablet 100 mg (100 mg Oral Given 06/28/22 2156)  bacitracin ointment (1 Application Topical Given 06/28/22 2157)    ED Course/ Medical Decision Making/ A&P                           Medical Decision Making Risk OTC drugs. Prescription drug management.   [name] is here with concern for infection.  Normal vitals.  No fever.  History of CKD and diabetes, has a left BKA wears a prosthesis.  He had an area skin breakdown behind his left knee which I suspect is from his prosthetic.  There are some mild erythema around this.  May be a mild cellulitis.  He was prescribed an antibiotic last week with his primary care doctor but did not know where to go pick  it up.  Overall we will use bacitracin ointment on this twice daily.  He needs to cover this and provide some padding to this area to go with his prosthesis.  Will provide doxycycline prescription and recommend close follow-up with primary care doctor.  Overall I think this is mostly irritation and skin breakdown  from rubbing up against his prosthesis.  However will cover with antibiotics.  Discharged in good condition.  Understands return precautions.  He has normal vitals.  No concern for systemic infection.  This chart was dictated using voice recognition software.  Despite best efforts to proofread,  errors can occur which can change the documentation meaning.         Final Clinical Impression(s) / ED Diagnoses Final diagnoses:  Cellulitis, unspecified cellulitis site    Rx / DC Orders ED Discharge Orders          Ordered    doxycycline (VIBRAMYCIN) 100 MG capsule  2 times daily        06/28/22 2220    bacitracin ointment  2 times daily        06/28/22 2220              Lennice Sites, DO 06/28/22 2227

## 2022-06-28 NOTE — Discharge Instructions (Signed)
Take next dose antibiotic tomorrow morning.  Use bacitracin ointment or Neosporin ointment twice daily to this area of skin breakdown on your left leg.  Please cover with gauze and a light wrap.  Try to provide some padding to this area as I suspect this is skin breakdown from her prosthetic.  Follow-up with your primary care doctor.

## 2022-06-29 DIAGNOSIS — E1129 Type 2 diabetes mellitus with other diabetic kidney complication: Secondary | ICD-10-CM | POA: Diagnosis not present

## 2022-06-29 DIAGNOSIS — Z992 Dependence on renal dialysis: Secondary | ICD-10-CM | POA: Diagnosis not present

## 2022-06-29 DIAGNOSIS — Z743 Need for continuous supervision: Secondary | ICD-10-CM | POA: Diagnosis not present

## 2022-06-29 DIAGNOSIS — N186 End stage renal disease: Secondary | ICD-10-CM | POA: Diagnosis not present

## 2022-06-29 DIAGNOSIS — Z7401 Bed confinement status: Secondary | ICD-10-CM | POA: Diagnosis not present

## 2022-06-29 DIAGNOSIS — R6889 Other general symptoms and signs: Secondary | ICD-10-CM | POA: Diagnosis not present

## 2022-06-29 NOTE — ED Notes (Signed)
[facility] called for pt. Transportation home

## 2022-06-30 ENCOUNTER — Telehealth: Payer: Medicare Other | Admitting: Physician Assistant

## 2022-06-30 ENCOUNTER — Telehealth: Payer: Self-pay | Admitting: Emergency Medicine

## 2022-06-30 DIAGNOSIS — D631 Anemia in chronic kidney disease: Secondary | ICD-10-CM | POA: Diagnosis not present

## 2022-06-30 DIAGNOSIS — D689 Coagulation defect, unspecified: Secondary | ICD-10-CM | POA: Diagnosis not present

## 2022-06-30 DIAGNOSIS — R52 Pain, unspecified: Secondary | ICD-10-CM | POA: Diagnosis not present

## 2022-06-30 DIAGNOSIS — Z992 Dependence on renal dialysis: Secondary | ICD-10-CM | POA: Diagnosis not present

## 2022-06-30 DIAGNOSIS — E1122 Type 2 diabetes mellitus with diabetic chronic kidney disease: Secondary | ICD-10-CM | POA: Diagnosis not present

## 2022-06-30 DIAGNOSIS — D509 Iron deficiency anemia, unspecified: Secondary | ICD-10-CM | POA: Diagnosis not present

## 2022-06-30 DIAGNOSIS — N186 End stage renal disease: Secondary | ICD-10-CM | POA: Diagnosis not present

## 2022-06-30 DIAGNOSIS — N2581 Secondary hyperparathyroidism of renal origin: Secondary | ICD-10-CM | POA: Diagnosis not present

## 2022-06-30 NOTE — Telephone Encounter (Signed)
Copied from [place] [id number]. Topic: General - Other >> Jun 30, 2022 11:23 AM Mark [name] wrote: Reason for CRM: The patient has returned a missed call from the practice  Please contact further when possible

## 2022-06-30 NOTE — Progress Notes (Deleted)
Patient ID: Mark [name], male   DOB: Jan 22, 1951, 71 y.o.   MRN: [id number]  ED 06/28/2022 [name] is here with concern for infection.  Normal vitals.  No fever.  History of CKD and diabetes, has a left BKA wears a prosthesis.  He had an area skin breakdown behind his left knee which I suspect is from his prosthetic.  There are some mild erythema around this.  May be a mild cellulitis.  He was prescribed an antibiotic last week with his primary care doctor but did not know where to go pick it up.  Overall we will use bacitracin ointment on this twice daily.  He needs to cover this and provide some padding to this area to go with his prosthesis.  Will provide doxycycline prescription and recommend close follow-up with primary care doctor.  Overall I think this is mostly irritation and skin breakdown from rubbing up against his prosthesis.  However will cover with antibiotics.  Discharged in good condition.  Understands return precautions.  He has normal vitals.  No concern for systemic infection.

## 2022-07-02 DIAGNOSIS — D509 Iron deficiency anemia, unspecified: Secondary | ICD-10-CM | POA: Diagnosis not present

## 2022-07-02 DIAGNOSIS — D631 Anemia in chronic kidney disease: Secondary | ICD-10-CM | POA: Diagnosis not present

## 2022-07-02 DIAGNOSIS — D689 Coagulation defect, unspecified: Secondary | ICD-10-CM | POA: Diagnosis not present

## 2022-07-02 DIAGNOSIS — N2581 Secondary hyperparathyroidism of renal origin: Secondary | ICD-10-CM | POA: Diagnosis not present

## 2022-07-02 DIAGNOSIS — N186 End stage renal disease: Secondary | ICD-10-CM | POA: Diagnosis not present

## 2022-07-02 DIAGNOSIS — Z992 Dependence on renal dialysis: Secondary | ICD-10-CM | POA: Diagnosis not present

## 2022-07-02 DIAGNOSIS — E1122 Type 2 diabetes mellitus with diabetic chronic kidney disease: Secondary | ICD-10-CM | POA: Diagnosis not present

## 2022-07-02 DIAGNOSIS — R52 Pain, unspecified: Secondary | ICD-10-CM | POA: Diagnosis not present

## 2022-07-02 NOTE — Telephone Encounter (Signed)
Please disregard last message. 

## 2022-07-02 NOTE — Telephone Encounter (Signed)
Called and made appt, patient is also aware of time and date  Interpreter id# [phone]

## 2022-07-05 DIAGNOSIS — R52 Pain, unspecified: Secondary | ICD-10-CM | POA: Diagnosis not present

## 2022-07-05 DIAGNOSIS — D631 Anemia in chronic kidney disease: Secondary | ICD-10-CM | POA: Diagnosis not present

## 2022-07-05 DIAGNOSIS — N2581 Secondary hyperparathyroidism of renal origin: Secondary | ICD-10-CM | POA: Diagnosis not present

## 2022-07-05 DIAGNOSIS — D689 Coagulation defect, unspecified: Secondary | ICD-10-CM | POA: Diagnosis not present

## 2022-07-05 DIAGNOSIS — D509 Iron deficiency anemia, unspecified: Secondary | ICD-10-CM | POA: Diagnosis not present

## 2022-07-05 DIAGNOSIS — Z992 Dependence on renal dialysis: Secondary | ICD-10-CM | POA: Diagnosis not present

## 2022-07-05 DIAGNOSIS — E1122 Type 2 diabetes mellitus with diabetic chronic kidney disease: Secondary | ICD-10-CM | POA: Diagnosis not present

## 2022-07-05 DIAGNOSIS — N186 End stage renal disease: Secondary | ICD-10-CM | POA: Diagnosis not present

## 2022-07-06 ENCOUNTER — Ambulatory Visit (INDEPENDENT_AMBULATORY_CARE_PROVIDER_SITE_OTHER): Payer: Medicare Other | Admitting: Orthopedic Surgery

## 2022-07-06 ENCOUNTER — Encounter: Payer: Self-pay | Admitting: Orthopedic Surgery

## 2022-07-06 DIAGNOSIS — S92414K Nondisplaced fracture of proximal phalanx of right great toe, subsequent encounter for fracture with nonunion: Secondary | ICD-10-CM | POA: Diagnosis not present

## 2022-07-06 DIAGNOSIS — Z89512 Acquired absence of left leg below knee: Secondary | ICD-10-CM | POA: Diagnosis not present

## 2022-07-06 DIAGNOSIS — L97522 Non-pressure chronic ulcer of other part of left foot with fat layer exposed: Secondary | ICD-10-CM | POA: Diagnosis not present

## 2022-07-06 DIAGNOSIS — E1351 Other specified diabetes mellitus with diabetic peripheral angiopathy without gangrene: Secondary | ICD-10-CM | POA: Diagnosis not present

## 2022-07-06 DIAGNOSIS — L97512 Non-pressure chronic ulcer of other part of right foot with fat layer exposed: Secondary | ICD-10-CM | POA: Diagnosis not present

## 2022-07-06 DIAGNOSIS — M1711 Unilateral primary osteoarthritis, right knee: Secondary | ICD-10-CM | POA: Diagnosis not present

## 2022-07-06 DIAGNOSIS — B351 Tinea unguium: Secondary | ICD-10-CM | POA: Diagnosis not present

## 2022-07-06 DIAGNOSIS — L84 Corns and callosities: Secondary | ICD-10-CM | POA: Diagnosis not present

## 2022-07-06 MED ORDER — METHYLPREDNISOLONE ACETATE 40 MG/ML IJ SUSP
40.0000 mg | INTRAMUSCULAR | Status: AC | PRN
  Administered 2022-07-06: 40 mg via INTRA_ARTICULAR

## 2022-07-06 MED ORDER — LIDOCAINE HCL (PF) 1 % IJ SOLN
5.0000 mL | INTRAMUSCULAR | Status: AC | PRN
  Administered 2022-07-06: 5 mL

## 2022-07-06 NOTE — Telephone Encounter (Signed)
Called patient, had no question or concerns and reminded him of appt
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
## 2022-07-07 DIAGNOSIS — D689 Coagulation defect, unspecified: Secondary | ICD-10-CM | POA: Diagnosis not present

## 2022-07-07 DIAGNOSIS — Z992 Dependence on renal dialysis: Secondary | ICD-10-CM | POA: Diagnosis not present

## 2022-07-07 DIAGNOSIS — N2581 Secondary hyperparathyroidism of renal origin: Secondary | ICD-10-CM | POA: Diagnosis not present

## 2022-07-07 DIAGNOSIS — D631 Anemia in chronic kidney disease: Secondary | ICD-10-CM | POA: Diagnosis not present

## 2022-07-07 DIAGNOSIS — N186 End stage renal disease: Secondary | ICD-10-CM | POA: Diagnosis not present

## 2022-07-07 DIAGNOSIS — R52 Pain, unspecified: Secondary | ICD-10-CM | POA: Diagnosis not present

## 2022-07-07 DIAGNOSIS — E1122 Type 2 diabetes mellitus with diabetic chronic kidney disease: Secondary | ICD-10-CM | POA: Diagnosis not present

## 2022-07-07 DIAGNOSIS — D509 Iron deficiency anemia, unspecified: Secondary | ICD-10-CM | POA: Diagnosis not present

## 2022-07-12 DIAGNOSIS — D631 Anemia in chronic kidney disease: Secondary | ICD-10-CM | POA: Diagnosis not present

## 2022-07-12 DIAGNOSIS — E1122 Type 2 diabetes mellitus with diabetic chronic kidney disease: Secondary | ICD-10-CM | POA: Diagnosis not present

## 2022-07-12 DIAGNOSIS — N186 End stage renal disease: Secondary | ICD-10-CM | POA: Diagnosis not present

## 2022-07-12 DIAGNOSIS — D509 Iron deficiency anemia, unspecified: Secondary | ICD-10-CM | POA: Diagnosis not present

## 2022-07-12 DIAGNOSIS — N2581 Secondary hyperparathyroidism of renal origin: Secondary | ICD-10-CM | POA: Diagnosis not present

## 2022-07-12 DIAGNOSIS — Z992 Dependence on renal dialysis: Secondary | ICD-10-CM | POA: Diagnosis not present

## 2022-07-12 DIAGNOSIS — R52 Pain, unspecified: Secondary | ICD-10-CM | POA: Diagnosis not present

## 2022-07-12 DIAGNOSIS — D689 Coagulation defect, unspecified: Secondary | ICD-10-CM | POA: Diagnosis not present

## 2022-07-14 DIAGNOSIS — D509 Iron deficiency anemia, unspecified: Secondary | ICD-10-CM | POA: Diagnosis not present

## 2022-07-14 DIAGNOSIS — N186 End stage renal disease: Secondary | ICD-10-CM | POA: Diagnosis not present

## 2022-07-14 DIAGNOSIS — D689 Coagulation defect, unspecified: Secondary | ICD-10-CM | POA: Diagnosis not present

## 2022-07-14 DIAGNOSIS — D631 Anemia in chronic kidney disease: Secondary | ICD-10-CM | POA: Diagnosis not present

## 2022-07-14 DIAGNOSIS — Z992 Dependence on renal dialysis: Secondary | ICD-10-CM | POA: Diagnosis not present

## 2022-07-14 DIAGNOSIS — E1122 Type 2 diabetes mellitus with diabetic chronic kidney disease: Secondary | ICD-10-CM | POA: Diagnosis not present

## 2022-07-14 DIAGNOSIS — R52 Pain, unspecified: Secondary | ICD-10-CM | POA: Diagnosis not present

## 2022-07-14 DIAGNOSIS — N2581 Secondary hyperparathyroidism of renal origin: Secondary | ICD-10-CM | POA: Diagnosis not present

## 2022-07-16 DIAGNOSIS — N2581 Secondary hyperparathyroidism of renal origin: Secondary | ICD-10-CM | POA: Diagnosis not present

## 2022-07-16 DIAGNOSIS — E1122 Type 2 diabetes mellitus with diabetic chronic kidney disease: Secondary | ICD-10-CM | POA: Diagnosis not present

## 2022-07-16 DIAGNOSIS — Z992 Dependence on renal dialysis: Secondary | ICD-10-CM | POA: Diagnosis not present

## 2022-07-16 DIAGNOSIS — D509 Iron deficiency anemia, unspecified: Secondary | ICD-10-CM | POA: Diagnosis not present

## 2022-07-16 DIAGNOSIS — D631 Anemia in chronic kidney disease: Secondary | ICD-10-CM | POA: Diagnosis not present

## 2022-07-16 DIAGNOSIS — N186 End stage renal disease: Secondary | ICD-10-CM | POA: Diagnosis not present

## 2022-07-16 DIAGNOSIS — D689 Coagulation defect, unspecified: Secondary | ICD-10-CM | POA: Diagnosis not present

## 2022-07-16 DIAGNOSIS — R52 Pain, unspecified: Secondary | ICD-10-CM | POA: Diagnosis not present

## 2022-07-18 DIAGNOSIS — N2581 Secondary hyperparathyroidism of renal origin: Secondary | ICD-10-CM | POA: Diagnosis not present

## 2022-07-18 DIAGNOSIS — E1122 Type 2 diabetes mellitus with diabetic chronic kidney disease: Secondary | ICD-10-CM | POA: Diagnosis not present

## 2022-07-18 DIAGNOSIS — M79605 Pain in left leg: Secondary | ICD-10-CM | POA: Diagnosis not present

## 2022-07-18 DIAGNOSIS — M726 Necrotizing fasciitis: Secondary | ICD-10-CM | POA: Diagnosis not present

## 2022-07-18 DIAGNOSIS — S81801A Unspecified open wound, right lower leg, initial encounter: Secondary | ICD-10-CM | POA: Diagnosis not present

## 2022-07-18 DIAGNOSIS — Z992 Dependence on renal dialysis: Secondary | ICD-10-CM | POA: Diagnosis not present

## 2022-07-18 DIAGNOSIS — R52 Pain, unspecified: Secondary | ICD-10-CM | POA: Diagnosis not present

## 2022-07-18 DIAGNOSIS — D509 Iron deficiency anemia, unspecified: Secondary | ICD-10-CM | POA: Diagnosis not present

## 2022-07-18 DIAGNOSIS — N186 End stage renal disease: Secondary | ICD-10-CM | POA: Diagnosis not present

## 2022-07-18 DIAGNOSIS — D689 Coagulation defect, unspecified: Secondary | ICD-10-CM | POA: Diagnosis not present

## 2022-07-18 DIAGNOSIS — A419 Sepsis, unspecified organism: Secondary | ICD-10-CM | POA: Diagnosis not present

## 2022-07-18 DIAGNOSIS — D631 Anemia in chronic kidney disease: Secondary | ICD-10-CM | POA: Diagnosis not present

## 2022-07-18 DIAGNOSIS — L0390 Cellulitis, unspecified: Secondary | ICD-10-CM | POA: Diagnosis not present

## 2022-07-21 ENCOUNTER — Emergency Department (HOSPITAL_COMMUNITY): Payer: Medicare Other

## 2022-07-21 ENCOUNTER — Emergency Department (HOSPITAL_COMMUNITY)
Admission: EM | Admit: 2022-07-21 | Discharge: 2022-07-21 | Disposition: A | Discharge status: Home / Self Care | Payer: Medicare Other | Attending: Emergency Medicine | Admitting: Emergency Medicine

## 2022-07-21 ENCOUNTER — Other Ambulatory Visit: Payer: Self-pay

## 2022-07-21 DIAGNOSIS — M19019 Primary osteoarthritis, unspecified shoulder: Secondary | ICD-10-CM

## 2022-07-21 DIAGNOSIS — I132 Hypertensive heart and chronic kidney disease with heart failure and with stage 5 chronic kidney disease, or end stage renal disease: Secondary | ICD-10-CM | POA: Insufficient documentation

## 2022-07-21 DIAGNOSIS — Z794 Long term (current) use of insulin: Secondary | ICD-10-CM | POA: Insufficient documentation

## 2022-07-21 DIAGNOSIS — Z79899 Other long term (current) drug therapy: Secondary | ICD-10-CM | POA: Insufficient documentation

## 2022-07-21 DIAGNOSIS — Z743 Need for continuous supervision: Secondary | ICD-10-CM | POA: Diagnosis not present

## 2022-07-21 DIAGNOSIS — Z7401 Bed confinement status: Secondary | ICD-10-CM | POA: Diagnosis not present

## 2022-07-21 DIAGNOSIS — N186 End stage renal disease: Secondary | ICD-10-CM | POA: Insufficient documentation

## 2022-07-21 DIAGNOSIS — I509 Heart failure, unspecified: Secondary | ICD-10-CM | POA: Diagnosis not present

## 2022-07-21 DIAGNOSIS — I10 Essential (primary) hypertension: Secondary | ICD-10-CM | POA: Diagnosis not present

## 2022-07-21 DIAGNOSIS — Z992 Dependence on renal dialysis: Secondary | ICD-10-CM | POA: Insufficient documentation

## 2022-07-21 DIAGNOSIS — I129 Hypertensive chronic kidney disease with stage 1 through stage 4 chronic kidney disease, or unspecified chronic kidney disease: Secondary | ICD-10-CM | POA: Diagnosis not present

## 2022-07-21 DIAGNOSIS — Z8579 Personal history of other malignant neoplasms of lymphoid, hematopoietic and related tissues: Secondary | ICD-10-CM | POA: Diagnosis not present

## 2022-07-21 DIAGNOSIS — M19011 Primary osteoarthritis, right shoulder: Secondary | ICD-10-CM | POA: Diagnosis not present

## 2022-07-21 DIAGNOSIS — N189 Chronic kidney disease, unspecified: Secondary | ICD-10-CM

## 2022-07-21 DIAGNOSIS — M19012 Primary osteoarthritis, left shoulder: Secondary | ICD-10-CM | POA: Insufficient documentation

## 2022-07-21 DIAGNOSIS — M25512 Pain in left shoulder: Secondary | ICD-10-CM | POA: Diagnosis not present

## 2022-07-21 DIAGNOSIS — M25519 Pain in unspecified shoulder: Secondary | ICD-10-CM | POA: Diagnosis not present

## 2022-07-21 LAB — CBC WITH DIFFERENTIAL/PLATELET
Abs Immature Granulocytes: 0.02 10*3/uL (ref 0.00–0.07)
Basophils Absolute: 0 10*3/uL (ref 0.0–0.1)
Basophils Relative: 1 %
Eosinophils Absolute: 0.3 10*3/uL (ref 0.0–0.5)
Eosinophils Relative: 4 %
HCT: 33.8 % — ABNORMAL LOW (ref 39.0–52.0)
Hemoglobin: 10.8 g/dL — ABNORMAL LOW (ref 13.0–17.0)
Immature Granulocytes: 0 %
Lymphocytes Relative: 26 %
Lymphs Abs: 2 10*3/uL (ref 0.7–4.0)
MCH: 30.4 pg (ref 26.0–34.0)
MCHC: 32 g/dL (ref 30.0–36.0)
MCV: 95.2 fL (ref 80.0–100.0)
Monocytes Absolute: 0.6 10*3/uL (ref 0.1–1.0)
Monocytes Relative: 7 %
Neutro Abs: 4.9 10*3/uL (ref 1.7–7.7)
Neutrophils Relative %: 62 %
Platelets: 479 10*3/uL — ABNORMAL HIGH (ref 150–400)
RBC: 3.55 MIL/uL — ABNORMAL LOW (ref 4.22–5.81)
RDW: 17.5 % — ABNORMAL HIGH (ref 11.5–15.5)
WBC: 7.8 10*3/uL (ref 4.0–10.5)
nRBC: 0.3 % — ABNORMAL HIGH (ref 0.0–0.2)

## 2022-07-21 LAB — BASIC METABOLIC PANEL
Anion gap: 21 — ABNORMAL HIGH (ref 5–15)
BUN: 79 mg/dL — ABNORMAL HIGH (ref 8–23)
CO2: 19 mmol/L — ABNORMAL LOW (ref 22–32)
Calcium: 9.7 mg/dL (ref 8.9–10.3)
Chloride: 96 mmol/L — ABNORMAL LOW (ref 98–111)
Creatinine, Ser: 10.48 mg/dL — ABNORMAL HIGH (ref 0.61–1.24)
GFR, Estimated: 5 mL/min — ABNORMAL LOW (ref >60)
Glucose, Bld: 160 mg/dL — ABNORMAL HIGH (ref 70–99)
Potassium: 4.2 mmol/L (ref 3.5–5.1)
Sodium: 136 mmol/L (ref 135–145)

## 2022-07-21 MED ORDER — HYDROCODONE-ACETAMINOPHEN 5-325 MG PO TABS: 1.0000 | ORAL_TABLET | Freq: Four times a day (QID) | ORAL | 0 refills | Status: AC | PRN

## 2022-07-21 MED ORDER — HYDROCODONE-ACETAMINOPHEN 5-325 MG PO TABS
2.0000 | ORAL_TABLET | Freq: Once | ORAL | Status: AC
  Administered 2022-07-21: 2 via ORAL
  Filled 2022-07-21: qty 2

## 2022-07-21 MED ORDER — HYDROCODONE-ACETAMINOPHEN 5-325 MG PO TABS
1.0000 | ORAL_TABLET | Freq: Once | ORAL | Status: AC
  Administered 2022-07-21: 1 via ORAL
  Filled 2022-07-21: qty 1

## 2022-07-21 MED ORDER — HYDRALAZINE HCL 10 MG PO TABS
10.0000 mg | ORAL_TABLET | Freq: Once | ORAL | Status: AC
  Administered 2022-07-21: 10 mg via ORAL
  Filled 2022-07-21: qty 1

## 2022-07-21 NOTE — ED Notes (Signed)
[facility] transport arranged for pt

## 2022-07-21 NOTE — ED Provider Notes (Signed)
[facility] DEPT Provider Note   CSN: [id number] Arrival date & time: 07/21/22  1438     History  Chief Complaint  Patient presents with   Leg Pain    Mark [name] is a 71 y.o. male.   Leg Pain    Patient has a history of diverticulitis, CHF, chronic kidney disease on dialysis, below the knee amputation on the left, multiple myeloma who presents to the ED with complaints of pain all over.  Patient states he has had trouble with joint pain ever since he turned 44 early in February.  Patient states he is primarily having pain in both of his shoulders right now.  He felt like his left shoulder was a little bit swollen.  He denies any recent fevers.  No vomiting or diarrhea.  No chest pain or shortness of breath.  Patient has been compliant with dialysis.  Patient's blood pressure is elevated today and he states he had been on blood pressure medications but they switched him to midodrine for low blood pressure  Home Medications Prior to Admission medications   Medication Sig Start Date End Date Taking? Authorizing Provider  HYDROcodone-acetaminophen (NORCO/VICODIN) 5-325 MG tablet Take 1 tablet by mouth every 6 (six) hours as needed. 07/21/22  Yes Dorie Rank, MD  acetaminophen (TYLENOL) 650 MG CR tablet Take 1 tablet (650 mg total) by mouth 2 (two) times daily as needed for pain. Patient taking differently: Take 1,300 mg by mouth as needed for pain. 12/11/21   [name], MD  atorvastatin (LIPITOR) 20 MG tablet Take 1 tablet (20 mg total) by mouth daily. 02/05/21   [name], MD  bacitracin ointment Apply 1 Application topically 2 (two) times daily. 06/28/22   Curatolo, Adam, DO  Blood Glucose Monitoring Suppl (ONETOUCH VERIO) w/Device KIT Use as directed to test blood sugar three times daily. 03/23/19   [name], MD  carvedilol (COREG) 25 MG tablet Take 1 tablet (25 mg total) by mouth 2 (two) times daily. 02/02/22   [name], MD  carvedilol (COREG) 25 MG tablet Take 25 mg by mouth 2 (two) times daily. 04/29/22   [provider]  doxycycline (VIBRAMYCIN) 100 MG capsule Take 1 capsule (100 mg total) by mouth 2 (two) times daily. 06/28/22   Curatolo, Adam, DO  furosemide (LASIX) 80 MG tablet Take 1 tablet (80 mg total) by mouth daily. 02/02/22   [name], MD  furosemide (LASIX) 80 MG tablet Take 80 mg by mouth daily. 04/29/22   [provider]  glucose blood (ONETOUCH VERIO) test strip USE AS DIRECTED THREE TIMES DAILY TO  TEST  BLOOD  SUGAR 03/15/21   [name], MD  insulin glargine (LANTUS SOLOSTAR) 100 UNIT/ML Solostar Pen Inject 20 Units into the skin at bedtime. 09/21/21   [name], MD  Insulin Pen Needle (TRUEPLUS 5-BEVEL PEN NEEDLES) 32G X 4 MM MISC Use to administer Lantus once daily. 02/19/21   [name], MD  Insulin Syringe-Needle U-100 (INSULIN SYRINGE .5CC/30GX5/16") 30G X 5/16" 0.5 ML MISC Check blood sugar TID & QHS 10/30/14   Advani, Vernon Prey, MD  lanthanum (FOSRENOL) 1000 MG chewable tablet Chew 1,000 mg by mouth in the morning, at noon, in the evening, and at bedtime. 07/27/21   [provider]  lanthanum (FOSRENOL) 1000 MG chewable tablet Chew 1,000 mg by mouth 4 (four) times daily. 05/26/22   [provider]  midodrine (PROAMATINE) 5 MG tablet  Take 5 mg by mouth daily. Do not taking if BP is low. 04/04/21   [provider]  Multiple Vitamin (MULTIVITAMIN WITH MINERALS) TABS tablet Take 1 tablet by mouth daily.    [provider]  OneTouch Delica Lancets 34K MISC Use as directed to test blood sugar three times daily. 06/17/21   Charlott Rakes, MD  OVER THE COUNTER MEDICATION Take 1 tablet by mouth daily. Mega Man    [provider]  VITAMIN D PO Take 1 capsule by mouth in the morning and at bedtime.    [provider]      Allergies    Oxycodone    Review of Systems   Review of Systems  Physical  Exam Updated Vital Signs BP (!) 147/106 (BP Location: Right Arm)   Pulse 90   Temp (!) 97.5 F (36.4 C) (Oral)   Resp 16   Ht 1.88 m (_0 )   Wt 104.3 kg   SpO2 92%   BMI 29.53 kg/m  Physical Exam Vitals and nursing note reviewed.  Constitutional:      Appearance: He is well-developed. He is not diaphoretic.  HENT:     Head: Normocephalic and atraumatic.     Right Ear: External ear normal.     Left Ear: External ear normal.  Eyes:     General: No scleral icterus.       Right eye: No discharge.        Left eye: No discharge.     Conjunctiva/sclera: Conjunctivae normal.  Neck:     Trachea: No tracheal deviation.  Cardiovascular:     Rate and Rhythm: Normal rate and regular rhythm.  Pulmonary:     Effort: Pulmonary effort is normal. No respiratory distress.     Breath sounds: Normal breath sounds. No stridor.  Abdominal:     General: Bowel sounds are normal. There is no distension.     Palpations: There is no mass.     Tenderness: There is no abdominal tenderness.  Musculoskeletal:        General: Tenderness present. No swelling or deformity.     Cervical back: Neck supple.     Comments: Status post below the knee amputation left lower extremity, no signs of erythema or edema right lower extremity, bilateral upper extremities without erythema or edema.  Patient does have tenderness palpation bilateral shoulders, pain with range of motion but no erythema or edema, no effusions appreciated  Skin:    General: Skin is warm and dry.     Findings: No rash.  Neurological:     Mental Status: He is alert.     Cranial Nerves: Cranial nerve deficit: no gross deficits.     ED Results / Procedures / Treatments   Labs (all labs ordered are listed, but only abnormal results are displayed) Labs Reviewed  CBC WITH DIFFERENTIAL/PLATELET - Abnormal; Notable for the following components:      Result Value   RBC 3.55 (*)    Hemoglobin 10.8 (*)    HCT 33.8 (*)    RDW 17.5 (*)     Platelets 479 (*)    nRBC 0.3 (*)    All other components within normal limits  BASIC METABOLIC PANEL - Abnormal; Notable for the following components:   Chloride 96 (*)    CO2 19 (*)    Glucose, Bld 160 (*)    BUN 79 (*)    Creatinine, Ser 10.48 (*)    GFR, Estimated 5 (*)  Anion gap 21 (*)    All other components within normal limits    EKG None   Procedures Procedures    Medications Ordered in ED Medications  HYDROcodone-acetaminophen (NORCO/VICODIN) 5-325 MG per tablet 1 tablet (1 tablet Oral Given 07/21/22 1830)  HYDROcodone-acetaminophen (NORCO/VICODIN) 5-325 MG per tablet 2 tablet (2 tablets Oral Given 07/21/22 1917)  hydrALAZINE (APRESOLINE) tablet 10 mg (10 mg Oral Given 07/21/22 1917)    ED Course/ Medical Decision Making/ A&P Clinical Course as of 07/24/22 2055  Wed Jul 21, 2022  1947 CBC shows stable hemoglobin.  Metabolic panel is consistent with his chronic kidney disease [JK]  1947 Patient notably hypertensive.  Oral blood pressure meds ordered [JK]  1947 No acute findings noted shoulder x-rays.  Patient does have evidence of arthritis degenerative changes [JK]  2117 Pressure is improved.  Down to 140/100 at the bedside [JK]    Clinical Course User Index [JK] Dorie Rank, MD                           Medical Decision Making Problems Addressed: Arthritis, shoulder region: acute illness or injury Chronic kidney disease, unspecified CKD stage: chronic illness or injury Hypertension, unspecified type: chronic illness or injury  Amount and/or Complexity of Data Reviewed Labs: ordered. Decision-making details documented in ED Course. Radiology: ordered and independent interpretation performed.  Risk Prescription drug management.   Pt with complaints of joint pain.  Primarily complaining of shoulder pain on my exam.  No signs of infection.  No other systemic sx.   Most likely related to degen changed noted on xray  Pt hypertensive in the ED.   Asymptomatic.  BP improved during ed stay  Evaluation and diagnostic testing in the emergency department does not suggest an emergent condition requiring admission or immediate intervention beyond what has been performed at this time.  The patient is safe for discharge and has been instructed to return immediately for worsening symptoms, change in symptoms or any other concerns.         Final Clinical Impression(s) / ED Diagnoses Final diagnoses:  Arthritis, shoulder region  Chronic kidney disease, unspecified CKD stage  Hypertension, unspecified type    Rx / DC Orders ED Discharge Orders          Ordered    HYDROcodone-acetaminophen (NORCO/VICODIN) 5-325 MG tablet  Every 6 hours PRN        07/21/22 2119              Dorie Rank, MD 07/24/22 2057

## 2022-07-21 NOTE — ED Provider Triage Note (Signed)
Emergency Medicine Provider Triage Evaluation Note  Mark [name] , a 71 y.o. male  was evaluated in triage.  Pt complains of ongoing pain.  Patient has a history of end-stage renal disease and is on hemodialysis.  Last treatment was yesterday, but states that it had to be cut short due to pain.  He complains of pain in his bilateral shoulders and neck as well as his right leg.  Symptoms have been ongoing for about 2 months.  He denies recent injuries or falls.  No fevers or chills.  States that the pain feels like cramping.  He needed assistance from his wife getting out of bed today prompting emergency department visit.    Review of Systems  Positive: Body cramps Negative: Shortness of breath  Physical Exam  BP (!) 190/111 (BP Location: Right Arm)   Pulse (!) 101   Temp 97.9 F (36.6 C) (Oral)   Resp 18   Ht '6\' 2"'$  (1.88 m)   Wt 104.3 kg   SpO2 95%   BMI 29.53 kg/m  Gen:   Awake, no distress   Resp:  Normal effort  MSK:   Moves extremities Other:    Medical Decision Making  Medically screening exam initiated at 2:59 PM.  Appropriate orders placed.  [name] was informed that the remainder of the evaluation will be completed by another provider, this initial triage assessment does not replace that evaluation, and the importance of remaining in the ED until their evaluation is complete.     Carlisle Cater, PA-C 07/21/22 1503

## 2022-07-21 NOTE — ED Triage Notes (Signed)
BIBA c/o bilateral leg pain and left shoulder described as cramping.  Left BKA noted.  Reports not taking morning meds.

## 2022-07-21 NOTE — Discharge Instructions (Signed)
Continue your blood pressure medications.  Do not take the midodrine when your blood pressure is high.  Take the medications as needed for pain and follow-up with your orthopedic doctor for further treatment of your shoulder pain

## 2022-07-26 DIAGNOSIS — D689 Coagulation defect, unspecified: Secondary | ICD-10-CM | POA: Diagnosis not present

## 2022-07-26 DIAGNOSIS — E1122 Type 2 diabetes mellitus with diabetic chronic kidney disease: Secondary | ICD-10-CM | POA: Diagnosis not present

## 2022-07-26 DIAGNOSIS — R52 Pain, unspecified: Secondary | ICD-10-CM | POA: Diagnosis not present

## 2022-07-26 DIAGNOSIS — N2581 Secondary hyperparathyroidism of renal origin: Secondary | ICD-10-CM | POA: Diagnosis not present

## 2022-07-26 DIAGNOSIS — Z992 Dependence on renal dialysis: Secondary | ICD-10-CM | POA: Diagnosis not present

## 2022-07-26 DIAGNOSIS — D509 Iron deficiency anemia, unspecified: Secondary | ICD-10-CM | POA: Diagnosis not present

## 2022-07-26 DIAGNOSIS — D631 Anemia in chronic kidney disease: Secondary | ICD-10-CM | POA: Diagnosis not present

## 2022-07-26 DIAGNOSIS — N186 End stage renal disease: Secondary | ICD-10-CM | POA: Diagnosis not present

## 2022-07-27 ENCOUNTER — Ambulatory Visit: Payer: Self-pay

## 2022-07-27 ENCOUNTER — Telehealth: Payer: Medicare Other | Admitting: Family Medicine

## 2022-07-27 NOTE — Telephone Encounter (Signed)
  Chief Complaint: Infected wound Symptoms: Infected wound at amputation site Frequency: Ongoing Pertinent Negatives: Patient denies FEver Disposition: '[]'$ ED /'[]'$ Urgent Care (no appt availability in office) / '[x]'$ Appointment(In office/virtual)/ '[]'$  [facility]/ '[]'$ Home Care/ '[]'$ Refused Recommended Disposition /'[]'$ [facility]/ '[]'$  Follow-up with PCP Additional Notes: Pt states that wound is still infected and has not resolved. PT states that prosthetic is ill fitting and cannot be seen for adjustment for awhile.    Reason for Disposition  [1] Red streak runs from the wound AND [2] longer than 1 inch (2.5 cm)  Answer Assessment - Initial Assessment Questions 1. LOCATION: "Where is the wound located?"      Left leg at amputation 2. WOUND APPEARANCE: "What does the wound look like?"      Can't see it 3. SIZE: If redness is present, ask: "What is the size of the red area?" (Inches, centimeters, or compare to size of a coin)      unknown 4. SPREAD: "What's changed in the last day?"  "Do you see any red streaks coming from the wound?"     same 5. ONSET: "When did it start to look infected?"      Never resolved from las infection 6. MECHANISM: "How did the wound start, what was the cause?"     Amputation 7. PAIN: "Is there any pain?" If Yes, ask: "How bad is the pain?"   (Scale 1-10; or mild, moderate, severe)     9/10 8. FEVER: "Do you have a fever?" If Yes, ask: "What is your temperature, how was it measured, and when did it start?"     no 9. OTHER SYMPTOMS: "Do you have any other symptoms?" (e.g., shaking chills, weakness, rash elsewhere on body)     Rash around that area 10. PREGNANCY: "Is there any chance you are pregnant?" "When was your last menstrual period?"       na  Protocols used: Wound Infection-A-AH

## 2022-07-27 NOTE — Patient Instructions (Signed)
Please be seen in person at the closest Urgent Care to you.  Follow up with PCP for need of wound care referral.

## 2022-07-27 NOTE — Progress Notes (Signed)
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 He has been treated for this, but continues to have trouble. He is reporting that today it is still infected and not improving. This prosthetic is ill fitting and needs adjustment- thought reports that can not happen for a while.  He needs to have in person assessment given the duration and wound, wound care would be a good referral as well.   Patient acknowledged agreement and understanding of the plan.

## 2022-07-28 ENCOUNTER — Ambulatory Visit (INDEPENDENT_AMBULATORY_CARE_PROVIDER_SITE_OTHER): Payer: Medicare Other | Admitting: Family

## 2022-07-28 DIAGNOSIS — N2581 Secondary hyperparathyroidism of renal origin: Secondary | ICD-10-CM | POA: Diagnosis not present

## 2022-07-28 DIAGNOSIS — Z992 Dependence on renal dialysis: Secondary | ICD-10-CM | POA: Diagnosis not present

## 2022-07-28 DIAGNOSIS — N186 End stage renal disease: Secondary | ICD-10-CM | POA: Diagnosis not present

## 2022-07-28 DIAGNOSIS — Z89512 Acquired absence of left leg below knee: Secondary | ICD-10-CM

## 2022-07-28 DIAGNOSIS — D689 Coagulation defect, unspecified: Secondary | ICD-10-CM | POA: Diagnosis not present

## 2022-07-28 DIAGNOSIS — R52 Pain, unspecified: Secondary | ICD-10-CM | POA: Diagnosis not present

## 2022-07-28 DIAGNOSIS — E1122 Type 2 diabetes mellitus with diabetic chronic kidney disease: Secondary | ICD-10-CM | POA: Diagnosis not present

## 2022-07-28 DIAGNOSIS — D631 Anemia in chronic kidney disease: Secondary | ICD-10-CM | POA: Diagnosis not present

## 2022-07-28 DIAGNOSIS — D509 Iron deficiency anemia, unspecified: Secondary | ICD-10-CM | POA: Diagnosis not present

## 2022-07-28 DIAGNOSIS — S88112S Complete traumatic amputation at level between knee and ankle, left lower leg, sequela: Secondary | ICD-10-CM

## 2022-07-29 DIAGNOSIS — E1129 Type 2 diabetes mellitus with other diabetic kidney complication: Secondary | ICD-10-CM | POA: Diagnosis not present

## 2022-07-29 DIAGNOSIS — Z992 Dependence on renal dialysis: Secondary | ICD-10-CM | POA: Diagnosis not present

## 2022-07-29 DIAGNOSIS — N186 End stage renal disease: Secondary | ICD-10-CM | POA: Diagnosis not present

## 2022-07-30 ENCOUNTER — Encounter: Payer: Self-pay | Admitting: Family

## 2022-07-30 DIAGNOSIS — N186 End stage renal disease: Secondary | ICD-10-CM | POA: Diagnosis not present

## 2022-07-30 DIAGNOSIS — D689 Coagulation defect, unspecified: Secondary | ICD-10-CM | POA: Diagnosis not present

## 2022-07-30 DIAGNOSIS — Z992 Dependence on renal dialysis: Secondary | ICD-10-CM | POA: Diagnosis not present

## 2022-07-30 DIAGNOSIS — N2581 Secondary hyperparathyroidism of renal origin: Secondary | ICD-10-CM | POA: Diagnosis not present
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
## 2022-08-04 DIAGNOSIS — I132 Hypertensive heart and chronic kidney disease with heart failure and with stage 5 chronic kidney disease, or end stage renal disease: Secondary | ICD-10-CM | POA: Diagnosis not present

## 2022-08-04 DIAGNOSIS — I509 Heart failure, unspecified: Secondary | ICD-10-CM | POA: Diagnosis not present

## 2022-08-04 DIAGNOSIS — M5030 Other cervical disc degeneration, unspecified cervical region: Secondary | ICD-10-CM | POA: Diagnosis not present

## 2022-08-04 DIAGNOSIS — N2889 Other specified disorders of kidney and ureter: Secondary | ICD-10-CM | POA: Diagnosis not present

## 2022-08-04 DIAGNOSIS — R918 Other nonspecific abnormal finding of lung field: Secondary | ICD-10-CM | POA: Diagnosis not present

## 2022-08-04 DIAGNOSIS — D472 Monoclonal gammopathy: Secondary | ICD-10-CM | POA: Diagnosis not present

## 2022-08-04 DIAGNOSIS — G8929 Other chronic pain: Secondary | ICD-10-CM | POA: Diagnosis not present

## 2022-08-04 DIAGNOSIS — W06XXXA Fall from bed, initial encounter: Secondary | ICD-10-CM | POA: Diagnosis not present

## 2022-08-04 DIAGNOSIS — Z79899 Other long term (current) drug therapy: Secondary | ICD-10-CM | POA: Diagnosis not present

## 2022-08-04 DIAGNOSIS — L0390 Cellulitis, unspecified: Secondary | ICD-10-CM | POA: Diagnosis not present

## 2022-08-04 DIAGNOSIS — M1990 Unspecified osteoarthritis, unspecified site: Secondary | ICD-10-CM | POA: Diagnosis not present

## 2022-08-04 DIAGNOSIS — M4622 Osteomyelitis of vertebra, cervical region: Secondary | ICD-10-CM | POA: Diagnosis not present

## 2022-08-04 DIAGNOSIS — Z992 Dependence on renal dialysis: Secondary | ICD-10-CM | POA: Diagnosis not present

## 2022-08-04 DIAGNOSIS — Y998 Other external cause status: Secondary | ICD-10-CM | POA: Diagnosis not present

## 2022-08-04 DIAGNOSIS — T8744 Infection of amputation stump, left lower extremity: Secondary | ICD-10-CM | POA: Diagnosis not present

## 2022-08-04 DIAGNOSIS — S14152A Other incomplete lesion at C2 level of cervical spinal cord, initial encounter: Secondary | ICD-10-CM | POA: Diagnosis not present

## 2022-08-04 DIAGNOSIS — E785 Hyperlipidemia, unspecified: Secondary | ICD-10-CM | POA: Diagnosis not present

## 2022-08-04 DIAGNOSIS — D638 Anemia in other chronic diseases classified elsewhere: Secondary | ICD-10-CM | POA: Diagnosis not present

## 2022-08-04 DIAGNOSIS — S32040A Wedge compression fracture of fourth lumbar vertebra, initial encounter for closed fracture: Secondary | ICD-10-CM | POA: Diagnosis not present

## 2022-08-04 DIAGNOSIS — T82868A Thrombosis of vascular prosthetic devices, implants and grafts, initial encounter: Secondary | ICD-10-CM | POA: Diagnosis not present

## 2022-08-04 DIAGNOSIS — R6889 Other general symptoms and signs: Secondary | ICD-10-CM | POA: Diagnosis not present

## 2022-08-04 DIAGNOSIS — M4802 Spinal stenosis, cervical region: Secondary | ICD-10-CM | POA: Diagnosis not present

## 2022-08-04 DIAGNOSIS — N186 End stage renal disease: Secondary | ICD-10-CM | POA: Diagnosis not present

## 2022-08-04 DIAGNOSIS — M4642 Discitis, unspecified, cervical region: Secondary | ICD-10-CM | POA: Diagnosis not present

## 2022-08-04 DIAGNOSIS — I5020 Unspecified systolic (congestive) heart failure: Secondary | ICD-10-CM | POA: Diagnosis not present

## 2022-08-04 DIAGNOSIS — E119 Type 2 diabetes mellitus without complications: Secondary | ICD-10-CM | POA: Diagnosis not present

## 2022-08-04 DIAGNOSIS — J811 Chronic pulmonary edema: Secondary | ICD-10-CM | POA: Diagnosis not present

## 2022-08-04 DIAGNOSIS — L89223 Pressure ulcer of left hip, stage 3: Secondary | ICD-10-CM | POA: Diagnosis not present

## 2022-08-04 DIAGNOSIS — R339 Retention of urine, unspecified: Secondary | ICD-10-CM | POA: Diagnosis not present

## 2022-08-04 DIAGNOSIS — Z794 Long term (current) use of insulin: Secondary | ICD-10-CM | POA: Diagnosis not present

## 2022-08-04 DIAGNOSIS — E875 Hyperkalemia: Secondary | ICD-10-CM | POA: Diagnosis not present

## 2022-08-04 DIAGNOSIS — E872 Acidosis, unspecified: Secondary | ICD-10-CM | POA: Diagnosis not present

## 2022-08-04 DIAGNOSIS — S32049A Unspecified fracture of fourth lumbar vertebra, initial encounter for closed fracture: Secondary | ICD-10-CM | POA: Diagnosis not present

## 2022-08-04 DIAGNOSIS — E1122 Type 2 diabetes mellitus with diabetic chronic kidney disease: Secondary | ICD-10-CM | POA: Diagnosis not present

## 2022-08-04 DIAGNOSIS — M8610 Other acute osteomyelitis, unspecified site: Secondary | ICD-10-CM | POA: Diagnosis not present

## 2022-08-04 DIAGNOSIS — T82858A Stenosis of vascular prosthetic devices, implants and grafts, initial encounter: Secondary | ICD-10-CM | POA: Diagnosis not present

## 2022-08-04 DIAGNOSIS — W19XXXA Unspecified fall, initial encounter: Secondary | ICD-10-CM | POA: Diagnosis not present

## 2022-08-04 DIAGNOSIS — I120 Hypertensive chronic kidney disease with stage 5 chronic kidney disease or end stage renal disease: Secondary | ICD-10-CM | POA: Diagnosis not present

## 2022-08-04 DIAGNOSIS — M19012 Primary osteoarthritis, left shoulder: Secondary | ICD-10-CM | POA: Diagnosis not present

## 2022-08-04 DIAGNOSIS — M8448XA Pathological fracture, other site, initial encounter for fracture: Secondary | ICD-10-CM | POA: Diagnosis not present

## 2022-08-04 DIAGNOSIS — M25512 Pain in left shoulder: Secondary | ICD-10-CM | POA: Diagnosis not present

## 2022-08-04 DIAGNOSIS — M4646 Discitis, unspecified, lumbar region: Secondary | ICD-10-CM | POA: Diagnosis not present

## 2022-08-04 DIAGNOSIS — L89894 Pressure ulcer of other site, stage 4: Secondary | ICD-10-CM | POA: Diagnosis not present

## 2022-08-04 DIAGNOSIS — I110 Hypertensive heart disease with heart failure: Secondary | ICD-10-CM | POA: Diagnosis not present

## 2022-08-04 DIAGNOSIS — M5031 Other cervical disc degeneration,  high cervical region: Secondary | ICD-10-CM | POA: Diagnosis not present

## 2022-08-04 DIAGNOSIS — S12290A Other displaced fracture of third cervical vertebra, initial encounter for closed fracture: Secondary | ICD-10-CM | POA: Diagnosis not present

## 2022-08-04 DIAGNOSIS — Z043 Encounter for examination and observation following other accident: Secondary | ICD-10-CM | POA: Diagnosis not present

## 2022-08-04 DIAGNOSIS — S81801A Unspecified open wound, right lower leg, initial encounter: Secondary | ICD-10-CM | POA: Diagnosis not present

## 2022-08-04 DIAGNOSIS — S12200A Unspecified displaced fracture of third cervical vertebra, initial encounter for closed fracture: Secondary | ICD-10-CM | POA: Diagnosis not present

## 2022-08-04 DIAGNOSIS — M438X9 Other specified deforming dorsopathies, site unspecified: Secondary | ICD-10-CM | POA: Diagnosis not present

## 2022-08-04 DIAGNOSIS — I130 Hypertensive heart and chronic kidney disease with heart failure and stage 1 through stage 4 chronic kidney disease, or unspecified chronic kidney disease: Secondary | ICD-10-CM | POA: Diagnosis not present

## 2022-08-04 DIAGNOSIS — Z89511 Acquired absence of right leg below knee: Secondary | ICD-10-CM | POA: Diagnosis not present

## 2022-08-04 DIAGNOSIS — C9000 Multiple myeloma not having achieved remission: Secondary | ICD-10-CM | POA: Diagnosis not present

## 2022-08-04 DIAGNOSIS — M726 Necrotizing fasciitis: Secondary | ICD-10-CM | POA: Diagnosis not present

## 2022-08-04 DIAGNOSIS — A419 Sepsis, unspecified organism: Secondary | ICD-10-CM | POA: Diagnosis not present

## 2022-08-04 DIAGNOSIS — R197 Diarrhea, unspecified: Secondary | ICD-10-CM | POA: Diagnosis not present

## 2022-08-04 DIAGNOSIS — D631 Anemia in chronic kidney disease: Secondary | ICD-10-CM | POA: Diagnosis not present

## 2022-08-04 DIAGNOSIS — Z743 Need for continuous supervision: Secondary | ICD-10-CM | POA: Diagnosis not present

## 2022-08-04 DIAGNOSIS — D649 Anemia, unspecified: Secondary | ICD-10-CM | POA: Diagnosis not present

## 2022-08-04 DIAGNOSIS — S12200D Unspecified displaced fracture of third cervical vertebra, subsequent encounter for fracture with routine healing: Secondary | ICD-10-CM | POA: Diagnosis not present

## 2022-08-04 DIAGNOSIS — R404 Transient alteration of awareness: Secondary | ICD-10-CM | POA: Diagnosis not present

## 2022-08-04 DIAGNOSIS — D509 Iron deficiency anemia, unspecified: Secondary | ICD-10-CM | POA: Diagnosis not present

## 2022-08-04 DIAGNOSIS — Z89512 Acquired absence of left leg below knee: Secondary | ICD-10-CM | POA: Diagnosis not present

## 2022-08-04 DIAGNOSIS — Z91158 Patient's noncompliance with renal dialysis for other reason: Secondary | ICD-10-CM | POA: Diagnosis not present

## 2022-08-04 DIAGNOSIS — M549 Dorsalgia, unspecified: Secondary | ICD-10-CM | POA: Diagnosis not present

## 2022-08-04 DIAGNOSIS — G9589 Other specified diseases of spinal cord: Secondary | ICD-10-CM | POA: Diagnosis not present

## 2022-08-04 DIAGNOSIS — I5030 Unspecified diastolic (congestive) heart failure: Secondary | ICD-10-CM | POA: Diagnosis not present

## 2022-08-04 DIAGNOSIS — M542 Cervicalgia: Secondary | ICD-10-CM | POA: Diagnosis not present

## 2022-08-04 DIAGNOSIS — L89893 Pressure ulcer of other site, stage 3: Secondary | ICD-10-CM | POA: Diagnosis not present

## 2022-08-04 DIAGNOSIS — R410 Disorientation, unspecified: Secondary | ICD-10-CM | POA: Diagnosis not present

## 2022-08-04 DIAGNOSIS — M79605 Pain in left leg: Secondary | ICD-10-CM | POA: Diagnosis not present

## 2022-08-04 DIAGNOSIS — I5032 Chronic diastolic (congestive) heart failure: Secondary | ICD-10-CM | POA: Diagnosis not present

## 2022-08-04 DIAGNOSIS — I440 Atrioventricular block, first degree: Secondary | ICD-10-CM | POA: Diagnosis not present

## 2022-08-04 DIAGNOSIS — M8008XA Age-related osteoporosis with current pathological fracture, vertebra(e), initial encounter for fracture: Secondary | ICD-10-CM | POA: Diagnosis not present

## 2022-08-05 ENCOUNTER — Ambulatory Visit: Payer: Medicare Other | Admitting: Orthopedic Surgery

## 2022-08-05 ENCOUNTER — Telehealth: Payer: Self-pay | Admitting: Internal Medicine

## 2022-08-05 ENCOUNTER — Ambulatory Visit: Payer: Medicare Other | Admitting: Internal Medicine

## 2022-08-05 DIAGNOSIS — Z89512 Acquired absence of left leg below knee: Secondary | ICD-10-CM | POA: Diagnosis not present

## 2022-08-05 DIAGNOSIS — E119 Type 2 diabetes mellitus without complications: Secondary | ICD-10-CM | POA: Diagnosis not present

## 2022-08-05 DIAGNOSIS — D472 Monoclonal gammopathy: Secondary | ICD-10-CM | POA: Diagnosis not present

## 2022-08-05 DIAGNOSIS — L89223 Pressure ulcer of left hip, stage 3: Secondary | ICD-10-CM | POA: Diagnosis not present

## 2022-08-05 DIAGNOSIS — I120 Hypertensive chronic kidney disease with stage 5 chronic kidney disease or end stage renal disease: Secondary | ICD-10-CM | POA: Diagnosis not present

## 2022-08-05 DIAGNOSIS — W19XXXA Unspecified fall, initial encounter: Secondary | ICD-10-CM | POA: Diagnosis not present

## 2022-08-05 DIAGNOSIS — N186 End stage renal disease: Secondary | ICD-10-CM | POA: Diagnosis not present

## 2022-08-05 DIAGNOSIS — S12200A Unspecified displaced fracture of third cervical vertebra, initial encounter for closed fracture: Secondary | ICD-10-CM | POA: Diagnosis not present

## 2022-08-05 DIAGNOSIS — T82868A Thrombosis of vascular prosthetic devices, implants and grafts, initial encounter: Secondary | ICD-10-CM | POA: Diagnosis not present

## 2022-08-05 DIAGNOSIS — Z992 Dependence on renal dialysis: Secondary | ICD-10-CM | POA: Diagnosis not present

## 2022-08-05 DIAGNOSIS — D631 Anemia in chronic kidney disease: Secondary | ICD-10-CM | POA: Diagnosis not present

## 2022-08-05 NOTE — Telephone Encounter (Signed)
Pt would like Dr Wynetta Emery know he is in [facility] for his neck issue.  He has broken bones in his neck. Pt states he was trying to get out of bed into his chair and everything slipped and he found himself on the floor. Pt stayed on the floor until morning when his wife found him. Pt has missed dialysis Mon and Wed.  Nothing furher needed.

## 2022-08-06 DIAGNOSIS — N186 End stage renal disease: Secondary | ICD-10-CM | POA: Diagnosis not present

## 2022-08-06 DIAGNOSIS — D631 Anemia in chronic kidney disease: Secondary | ICD-10-CM | POA: Diagnosis not present

## 2022-08-06 DIAGNOSIS — I120 Hypertensive chronic kidney disease with stage 5 chronic kidney disease or end stage renal disease: Secondary | ICD-10-CM | POA: Diagnosis not present

## 2022-08-06 DIAGNOSIS — Z992 Dependence on renal dialysis: Secondary | ICD-10-CM | POA: Diagnosis not present

## 2022-08-06 DIAGNOSIS — T82868A Thrombosis of vascular prosthetic devices, implants and grafts, initial encounter: Secondary | ICD-10-CM | POA: Diagnosis not present

## 2022-08-06 DIAGNOSIS — D472 Monoclonal gammopathy: Secondary | ICD-10-CM | POA: Diagnosis not present

## 2022-08-06 DIAGNOSIS — S12200A Unspecified displaced fracture of third cervical vertebra, initial encounter for closed fracture: Secondary | ICD-10-CM | POA: Diagnosis not present

## 2022-08-07 DIAGNOSIS — D472 Monoclonal gammopathy: Secondary | ICD-10-CM | POA: Diagnosis not present

## 2022-08-07 DIAGNOSIS — I5030 Unspecified diastolic (congestive) heart failure: Secondary | ICD-10-CM | POA: Diagnosis not present

## 2022-08-07 DIAGNOSIS — D509 Iron deficiency anemia, unspecified: Secondary | ICD-10-CM | POA: Diagnosis not present

## 2022-08-07 DIAGNOSIS — I130 Hypertensive heart and chronic kidney disease with heart failure and stage 1 through stage 4 chronic kidney disease, or unspecified chronic kidney disease: Secondary | ICD-10-CM | POA: Diagnosis not present

## 2022-08-07 DIAGNOSIS — M4642 Discitis, unspecified, cervical region: Secondary | ICD-10-CM | POA: Diagnosis not present

## 2022-08-07 DIAGNOSIS — N186 End stage renal disease: Secondary | ICD-10-CM | POA: Diagnosis not present

## 2022-08-07 DIAGNOSIS — M4622 Osteomyelitis of vertebra, cervical region: Secondary | ICD-10-CM | POA: Diagnosis not present

## 2022-08-07 DIAGNOSIS — T82858A Stenosis of vascular prosthetic devices, implants and grafts, initial encounter: Secondary | ICD-10-CM | POA: Diagnosis not present

## 2022-08-07 DIAGNOSIS — E785 Hyperlipidemia, unspecified: Secondary | ICD-10-CM | POA: Diagnosis not present

## 2022-08-07 DIAGNOSIS — M8448XA Pathological fracture, other site, initial encounter for fracture: Secondary | ICD-10-CM | POA: Diagnosis not present

## 2022-08-07 DIAGNOSIS — N2889 Other specified disorders of kidney and ureter: Secondary | ICD-10-CM | POA: Diagnosis not present

## 2022-08-07 DIAGNOSIS — D638 Anemia in other chronic diseases classified elsewhere: Secondary | ICD-10-CM | POA: Diagnosis not present

## 2022-08-07 DIAGNOSIS — Z992 Dependence on renal dialysis: Secondary | ICD-10-CM | POA: Diagnosis not present

## 2022-08-07 DIAGNOSIS — Z794 Long term (current) use of insulin: Secondary | ICD-10-CM | POA: Diagnosis not present

## 2022-08-07 DIAGNOSIS — E1122 Type 2 diabetes mellitus with diabetic chronic kidney disease: Secondary | ICD-10-CM | POA: Diagnosis not present

## 2022-08-07 DIAGNOSIS — L89894 Pressure ulcer of other site, stage 4: Secondary | ICD-10-CM | POA: Diagnosis not present

## 2022-08-07 DIAGNOSIS — D631 Anemia in chronic kidney disease: Secondary | ICD-10-CM | POA: Diagnosis not present

## 2022-08-07 DIAGNOSIS — E872 Acidosis, unspecified: Secondary | ICD-10-CM | POA: Diagnosis not present

## 2022-08-07 DIAGNOSIS — I120 Hypertensive chronic kidney disease with stage 5 chronic kidney disease or end stage renal disease: Secondary | ICD-10-CM | POA: Diagnosis not present

## 2022-08-08 DIAGNOSIS — D509 Iron deficiency anemia, unspecified: Secondary | ICD-10-CM | POA: Diagnosis not present

## 2022-08-08 DIAGNOSIS — I120 Hypertensive chronic kidney disease with stage 5 chronic kidney disease or end stage renal disease: Secondary | ICD-10-CM | POA: Diagnosis not present

## 2022-08-08 DIAGNOSIS — M5030 Other cervical disc degeneration, unspecified cervical region: Secondary | ICD-10-CM | POA: Diagnosis not present

## 2022-08-08 DIAGNOSIS — E785 Hyperlipidemia, unspecified: Secondary | ICD-10-CM | POA: Diagnosis not present

## 2022-08-08 DIAGNOSIS — D638 Anemia in other chronic diseases classified elsewhere: Secondary | ICD-10-CM | POA: Diagnosis not present

## 2022-08-08 DIAGNOSIS — M8448XA Pathological fracture, other site, initial encounter for fracture: Secondary | ICD-10-CM | POA: Diagnosis not present

## 2022-08-08 DIAGNOSIS — L89894 Pressure ulcer of other site, stage 4: Secondary | ICD-10-CM | POA: Diagnosis not present

## 2022-08-08 DIAGNOSIS — N2889 Other specified disorders of kidney and ureter: Secondary | ICD-10-CM | POA: Diagnosis not present

## 2022-08-08 DIAGNOSIS — E1122 Type 2 diabetes mellitus with diabetic chronic kidney disease: Secondary | ICD-10-CM | POA: Diagnosis not present

## 2022-08-08 DIAGNOSIS — D631 Anemia in chronic kidney disease: Secondary | ICD-10-CM | POA: Diagnosis not present

## 2022-08-08 DIAGNOSIS — N186 End stage renal disease: Secondary | ICD-10-CM | POA: Diagnosis not present

## 2022-08-08 DIAGNOSIS — E872 Acidosis, unspecified: Secondary | ICD-10-CM | POA: Diagnosis not present

## 2022-08-08 DIAGNOSIS — I5030 Unspecified diastolic (congestive) heart failure: Secondary | ICD-10-CM | POA: Diagnosis not present

## 2022-08-08 DIAGNOSIS — Z992 Dependence on renal dialysis: Secondary | ICD-10-CM | POA: Diagnosis not present

## 2022-08-08 DIAGNOSIS — R410 Disorientation, unspecified: Secondary | ICD-10-CM | POA: Diagnosis not present

## 2022-08-08 DIAGNOSIS — I132 Hypertensive heart and chronic kidney disease with heart failure and with stage 5 chronic kidney disease, or end stage renal disease: Secondary | ICD-10-CM | POA: Diagnosis not present

## 2022-08-08 DIAGNOSIS — D472 Monoclonal gammopathy: Secondary | ICD-10-CM | POA: Diagnosis not present

## 2022-08-08 DIAGNOSIS — Z794 Long term (current) use of insulin: Secondary | ICD-10-CM | POA: Diagnosis not present

## 2022-08-09 DIAGNOSIS — R918 Other nonspecific abnormal finding of lung field: Secondary | ICD-10-CM | POA: Diagnosis not present

## 2022-08-09 DIAGNOSIS — J811 Chronic pulmonary edema: Secondary | ICD-10-CM | POA: Diagnosis not present

## 2022-08-09 DIAGNOSIS — D509 Iron deficiency anemia, unspecified: Secondary | ICD-10-CM | POA: Diagnosis not present

## 2022-08-09 DIAGNOSIS — N186 End stage renal disease: Secondary | ICD-10-CM | POA: Diagnosis not present

## 2022-08-09 DIAGNOSIS — D472 Monoclonal gammopathy: Secondary | ICD-10-CM | POA: Diagnosis not present

## 2022-08-09 DIAGNOSIS — Z992 Dependence on renal dialysis: Secondary | ICD-10-CM | POA: Diagnosis not present

## 2022-08-09 DIAGNOSIS — I5030 Unspecified diastolic (congestive) heart failure: Secondary | ICD-10-CM | POA: Diagnosis not present

## 2022-08-09 DIAGNOSIS — R410 Disorientation, unspecified: Secondary | ICD-10-CM | POA: Diagnosis not present

## 2022-08-09 DIAGNOSIS — I132 Hypertensive heart and chronic kidney disease with heart failure and with stage 5 chronic kidney disease, or end stage renal disease: Secondary | ICD-10-CM | POA: Diagnosis not present

## 2022-08-09 DIAGNOSIS — E1122 Type 2 diabetes mellitus with diabetic chronic kidney disease: Secondary | ICD-10-CM | POA: Diagnosis not present

## 2022-08-09 DIAGNOSIS — L89894 Pressure ulcer of other site, stage 4: Secondary | ICD-10-CM | POA: Diagnosis not present

## 2022-08-09 DIAGNOSIS — Z79899 Other long term (current) drug therapy: Secondary | ICD-10-CM | POA: Diagnosis not present

## 2022-08-09 DIAGNOSIS — D638 Anemia in other chronic diseases classified elsewhere: Secondary | ICD-10-CM | POA: Diagnosis not present

## 2022-08-09 DIAGNOSIS — E785 Hyperlipidemia, unspecified: Secondary | ICD-10-CM | POA: Diagnosis not present

## 2022-08-10 DIAGNOSIS — Z992 Dependence on renal dialysis: Secondary | ICD-10-CM | POA: Diagnosis not present

## 2022-08-10 DIAGNOSIS — T8744 Infection of amputation stump, left lower extremity: Secondary | ICD-10-CM | POA: Diagnosis not present

## 2022-08-10 DIAGNOSIS — D631 Anemia in chronic kidney disease: Secondary | ICD-10-CM | POA: Diagnosis not present

## 2022-08-10 DIAGNOSIS — T82868A Thrombosis of vascular prosthetic devices, implants and grafts, initial encounter: Secondary | ICD-10-CM | POA: Diagnosis not present

## 2022-08-10 DIAGNOSIS — N186 End stage renal disease: Secondary | ICD-10-CM | POA: Diagnosis not present

## 2022-08-10 DIAGNOSIS — I5030 Unspecified diastolic (congestive) heart failure: Secondary | ICD-10-CM | POA: Diagnosis not present

## 2022-08-10 DIAGNOSIS — R410 Disorientation, unspecified: Secondary | ICD-10-CM | POA: Diagnosis not present

## 2022-08-10 DIAGNOSIS — E1122 Type 2 diabetes mellitus with diabetic chronic kidney disease: Secondary | ICD-10-CM | POA: Diagnosis not present

## 2022-08-10 DIAGNOSIS — I132 Hypertensive heart and chronic kidney disease with heart failure and with stage 5 chronic kidney disease, or end stage renal disease: Secondary | ICD-10-CM | POA: Diagnosis not present

## 2022-08-10 DIAGNOSIS — D472 Monoclonal gammopathy: Secondary | ICD-10-CM | POA: Diagnosis not present

## 2022-08-10 DIAGNOSIS — I120 Hypertensive chronic kidney disease with stage 5 chronic kidney disease or end stage renal disease: Secondary | ICD-10-CM | POA: Diagnosis not present

## 2022-08-10 DIAGNOSIS — S12200A Unspecified displaced fracture of third cervical vertebra, initial encounter for closed fracture: Secondary | ICD-10-CM | POA: Diagnosis not present

## 2022-08-10 DIAGNOSIS — D509 Iron deficiency anemia, unspecified: Secondary | ICD-10-CM | POA: Diagnosis not present

## 2022-08-10 DIAGNOSIS — E785 Hyperlipidemia, unspecified: Secondary | ICD-10-CM | POA: Diagnosis not present

## 2022-08-11 DIAGNOSIS — S12200A Unspecified displaced fracture of third cervical vertebra, initial encounter for closed fracture: Secondary | ICD-10-CM | POA: Diagnosis not present

## 2022-08-11 DIAGNOSIS — I132 Hypertensive heart and chronic kidney disease with heart failure and with stage 5 chronic kidney disease, or end stage renal disease: Secondary | ICD-10-CM | POA: Diagnosis not present

## 2022-08-11 DIAGNOSIS — D631 Anemia in chronic kidney disease: Secondary | ICD-10-CM | POA: Diagnosis not present

## 2022-08-11 DIAGNOSIS — I120 Hypertensive chronic kidney disease with stage 5 chronic kidney disease or end stage renal disease: Secondary | ICD-10-CM | POA: Diagnosis not present

## 2022-08-11 DIAGNOSIS — N186 End stage renal disease: Secondary | ICD-10-CM | POA: Diagnosis not present

## 2022-08-11 DIAGNOSIS — E1122 Type 2 diabetes mellitus with diabetic chronic kidney disease: Secondary | ICD-10-CM | POA: Diagnosis not present

## 2022-08-11 DIAGNOSIS — D509 Iron deficiency anemia, unspecified: Secondary | ICD-10-CM | POA: Diagnosis not present

## 2022-08-11 DIAGNOSIS — R410 Disorientation, unspecified: Secondary | ICD-10-CM | POA: Diagnosis not present

## 2022-08-11 DIAGNOSIS — D472 Monoclonal gammopathy: Secondary | ICD-10-CM | POA: Diagnosis not present

## 2022-08-11 DIAGNOSIS — E785 Hyperlipidemia, unspecified: Secondary | ICD-10-CM | POA: Diagnosis not present

## 2022-08-11 DIAGNOSIS — Z992 Dependence on renal dialysis: Secondary | ICD-10-CM | POA: Diagnosis not present

## 2022-08-11 DIAGNOSIS — I5030 Unspecified diastolic (congestive) heart failure: Secondary | ICD-10-CM | POA: Diagnosis not present

## 2022-08-12 DIAGNOSIS — D472 Monoclonal gammopathy: Secondary | ICD-10-CM | POA: Diagnosis not present

## 2022-08-12 DIAGNOSIS — S12200A Unspecified displaced fracture of third cervical vertebra, initial encounter for closed fracture: Secondary | ICD-10-CM | POA: Diagnosis not present

## 2022-08-12 DIAGNOSIS — I120 Hypertensive chronic kidney disease with stage 5 chronic kidney disease or end stage renal disease: Secondary | ICD-10-CM | POA: Diagnosis not present

## 2022-08-12 DIAGNOSIS — D509 Iron deficiency anemia, unspecified: Secondary | ICD-10-CM | POA: Diagnosis not present

## 2022-08-12 DIAGNOSIS — E785 Hyperlipidemia, unspecified: Secondary | ICD-10-CM | POA: Diagnosis not present

## 2022-08-12 DIAGNOSIS — D631 Anemia in chronic kidney disease: Secondary | ICD-10-CM | POA: Diagnosis not present

## 2022-08-12 DIAGNOSIS — R410 Disorientation, unspecified: Secondary | ICD-10-CM | POA: Diagnosis not present

## 2022-08-12 DIAGNOSIS — E1122 Type 2 diabetes mellitus with diabetic chronic kidney disease: Secondary | ICD-10-CM | POA: Diagnosis not present

## 2022-08-12 DIAGNOSIS — Z992 Dependence on renal dialysis: Secondary | ICD-10-CM | POA: Diagnosis not present

## 2022-08-12 DIAGNOSIS — I5030 Unspecified diastolic (congestive) heart failure: Secondary | ICD-10-CM | POA: Diagnosis not present

## 2022-08-12 DIAGNOSIS — I132 Hypertensive heart and chronic kidney disease with heart failure and with stage 5 chronic kidney disease, or end stage renal disease: Secondary | ICD-10-CM | POA: Diagnosis not present

## 2022-08-12 DIAGNOSIS — N186 End stage renal disease: Secondary | ICD-10-CM | POA: Diagnosis not present

## 2022-08-13 DIAGNOSIS — Z992 Dependence on renal dialysis: Secondary | ICD-10-CM | POA: Diagnosis not present

## 2022-08-13 DIAGNOSIS — D631 Anemia in chronic kidney disease: Secondary | ICD-10-CM | POA: Diagnosis not present

## 2022-08-13 DIAGNOSIS — I110 Hypertensive heart disease with heart failure: Secondary | ICD-10-CM | POA: Diagnosis not present

## 2022-08-13 DIAGNOSIS — I120 Hypertensive chronic kidney disease with stage 5 chronic kidney disease or end stage renal disease: Secondary | ICD-10-CM | POA: Diagnosis not present

## 2022-08-13 DIAGNOSIS — I5030 Unspecified diastolic (congestive) heart failure: Secondary | ICD-10-CM | POA: Diagnosis not present

## 2022-08-13 DIAGNOSIS — S12200A Unspecified displaced fracture of third cervical vertebra, initial encounter for closed fracture: Secondary | ICD-10-CM | POA: Diagnosis not present

## 2022-08-13 DIAGNOSIS — R410 Disorientation, unspecified: Secondary | ICD-10-CM | POA: Diagnosis not present

## 2022-08-13 DIAGNOSIS — E119 Type 2 diabetes mellitus without complications: Secondary | ICD-10-CM | POA: Diagnosis not present

## 2022-08-13 DIAGNOSIS — E785 Hyperlipidemia, unspecified: Secondary | ICD-10-CM | POA: Diagnosis not present

## 2022-08-13 DIAGNOSIS — D509 Iron deficiency anemia, unspecified: Secondary | ICD-10-CM | POA: Diagnosis not present

## 2022-08-13 DIAGNOSIS — D472 Monoclonal gammopathy: Secondary | ICD-10-CM | POA: Diagnosis not present

## 2022-08-13 DIAGNOSIS — L89893 Pressure ulcer of other site, stage 3: Secondary | ICD-10-CM | POA: Diagnosis not present

## 2022-08-13 DIAGNOSIS — N186 End stage renal disease: Secondary | ICD-10-CM | POA: Diagnosis not present

## 2022-08-14 DIAGNOSIS — Z992 Dependence on renal dialysis: Secondary | ICD-10-CM | POA: Diagnosis not present

## 2022-08-14 DIAGNOSIS — S12200A Unspecified displaced fracture of third cervical vertebra, initial encounter for closed fracture: Secondary | ICD-10-CM | POA: Diagnosis not present

## 2022-08-14 DIAGNOSIS — E785 Hyperlipidemia, unspecified: Secondary | ICD-10-CM | POA: Diagnosis not present

## 2022-08-14 DIAGNOSIS — E119 Type 2 diabetes mellitus without complications: Secondary | ICD-10-CM | POA: Diagnosis not present

## 2022-08-14 DIAGNOSIS — T8744 Infection of amputation stump, left lower extremity: Secondary | ICD-10-CM | POA: Diagnosis not present

## 2022-08-14 DIAGNOSIS — D631 Anemia in chronic kidney disease: Secondary | ICD-10-CM | POA: Diagnosis not present

## 2022-08-14 DIAGNOSIS — D509 Iron deficiency anemia, unspecified: Secondary | ICD-10-CM | POA: Diagnosis not present

## 2022-08-14 DIAGNOSIS — I110 Hypertensive heart disease with heart failure: Secondary | ICD-10-CM | POA: Diagnosis not present

## 2022-08-14 DIAGNOSIS — I5030 Unspecified diastolic (congestive) heart failure: Secondary | ICD-10-CM | POA: Diagnosis not present

## 2022-08-14 DIAGNOSIS — I509 Heart failure, unspecified: Secondary | ICD-10-CM | POA: Diagnosis not present

## 2022-08-14 DIAGNOSIS — E1122 Type 2 diabetes mellitus with diabetic chronic kidney disease: Secondary | ICD-10-CM | POA: Diagnosis not present

## 2022-08-14 DIAGNOSIS — R410 Disorientation, unspecified: Secondary | ICD-10-CM | POA: Diagnosis not present

## 2022-08-14 DIAGNOSIS — I132 Hypertensive heart and chronic kidney disease with heart failure and with stage 5 chronic kidney disease, or end stage renal disease: Secondary | ICD-10-CM | POA: Diagnosis not present

## 2022-08-14 DIAGNOSIS — D472 Monoclonal gammopathy: Secondary | ICD-10-CM | POA: Diagnosis not present

## 2022-08-14 DIAGNOSIS — N186 End stage renal disease: Secondary | ICD-10-CM | POA: Diagnosis not present

## 2022-08-15 DIAGNOSIS — I509 Heart failure, unspecified: Secondary | ICD-10-CM | POA: Diagnosis not present

## 2022-08-15 DIAGNOSIS — D631 Anemia in chronic kidney disease: Secondary | ICD-10-CM | POA: Diagnosis not present

## 2022-08-15 DIAGNOSIS — N186 End stage renal disease: Secondary | ICD-10-CM | POA: Diagnosis not present

## 2022-08-15 DIAGNOSIS — E1122 Type 2 diabetes mellitus with diabetic chronic kidney disease: Secondary | ICD-10-CM | POA: Diagnosis not present

## 2022-08-15 DIAGNOSIS — I110 Hypertensive heart disease with heart failure: Secondary | ICD-10-CM | POA: Diagnosis not present

## 2022-08-15 DIAGNOSIS — D472 Monoclonal gammopathy: Secondary | ICD-10-CM | POA: Diagnosis not present

## 2022-08-15 DIAGNOSIS — E785 Hyperlipidemia, unspecified: Secondary | ICD-10-CM | POA: Diagnosis not present

## 2022-08-15 DIAGNOSIS — E119 Type 2 diabetes mellitus without complications: Secondary | ICD-10-CM | POA: Diagnosis not present

## 2022-08-15 DIAGNOSIS — R410 Disorientation, unspecified: Secondary | ICD-10-CM | POA: Diagnosis not present

## 2022-08-15 DIAGNOSIS — I132 Hypertensive heart and chronic kidney disease with heart failure and with stage 5 chronic kidney disease, or end stage renal disease: Secondary | ICD-10-CM | POA: Diagnosis not present

## 2022-08-15 DIAGNOSIS — S12200A Unspecified displaced fracture of third cervical vertebra, initial encounter for closed fracture: Secondary | ICD-10-CM | POA: Diagnosis not present

## 2022-08-15 DIAGNOSIS — Z992 Dependence on renal dialysis: Secondary | ICD-10-CM | POA: Diagnosis not present

## 2022-08-15 DIAGNOSIS — I5030 Unspecified diastolic (congestive) heart failure: Secondary | ICD-10-CM | POA: Diagnosis not present

## 2022-08-15 DIAGNOSIS — L89893 Pressure ulcer of other site, stage 3: Secondary | ICD-10-CM | POA: Diagnosis not present

## 2022-08-15 DIAGNOSIS — D509 Iron deficiency anemia, unspecified: Secondary | ICD-10-CM | POA: Diagnosis not present

## 2022-08-16 DIAGNOSIS — D472 Monoclonal gammopathy: Secondary | ICD-10-CM | POA: Diagnosis not present

## 2022-08-16 DIAGNOSIS — I110 Hypertensive heart disease with heart failure: Secondary | ICD-10-CM | POA: Diagnosis not present

## 2022-08-16 DIAGNOSIS — D631 Anemia in chronic kidney disease: Secondary | ICD-10-CM | POA: Diagnosis not present

## 2022-08-16 DIAGNOSIS — S12200A Unspecified displaced fracture of third cervical vertebra, initial encounter for closed fracture: Secondary | ICD-10-CM | POA: Diagnosis not present

## 2022-08-16 DIAGNOSIS — D509 Iron deficiency anemia, unspecified: Secondary | ICD-10-CM | POA: Diagnosis not present

## 2022-08-16 DIAGNOSIS — E785 Hyperlipidemia, unspecified: Secondary | ICD-10-CM | POA: Diagnosis not present

## 2022-08-16 DIAGNOSIS — L89893 Pressure ulcer of other site, stage 3: Secondary | ICD-10-CM | POA: Diagnosis not present

## 2022-08-16 DIAGNOSIS — Z992 Dependence on renal dialysis: Secondary | ICD-10-CM | POA: Diagnosis not present

## 2022-08-16 DIAGNOSIS — E119 Type 2 diabetes mellitus without complications: Secondary | ICD-10-CM | POA: Diagnosis not present

## 2022-08-16 DIAGNOSIS — R410 Disorientation, unspecified: Secondary | ICD-10-CM | POA: Diagnosis not present

## 2022-08-16 DIAGNOSIS — N186 End stage renal disease: Secondary | ICD-10-CM | POA: Diagnosis not present

## 2022-08-16 DIAGNOSIS — I5030 Unspecified diastolic (congestive) heart failure: Secondary | ICD-10-CM | POA: Diagnosis not present

## 2022-08-17 DIAGNOSIS — I132 Hypertensive heart and chronic kidney disease with heart failure and with stage 5 chronic kidney disease, or end stage renal disease: Secondary | ICD-10-CM | POA: Diagnosis not present

## 2022-08-17 DIAGNOSIS — N186 End stage renal disease: Secondary | ICD-10-CM | POA: Diagnosis not present

## 2022-08-17 DIAGNOSIS — M726 Necrotizing fasciitis: Secondary | ICD-10-CM | POA: Diagnosis not present

## 2022-08-17 DIAGNOSIS — S32040A Wedge compression fracture of fourth lumbar vertebra, initial encounter for closed fracture: Secondary | ICD-10-CM | POA: Diagnosis not present

## 2022-08-17 DIAGNOSIS — Z992 Dependence on renal dialysis: Secondary | ICD-10-CM | POA: Diagnosis not present

## 2022-08-17 DIAGNOSIS — M79605 Pain in left leg: Secondary | ICD-10-CM | POA: Diagnosis not present

## 2022-08-17 DIAGNOSIS — R197 Diarrhea, unspecified: Secondary | ICD-10-CM | POA: Diagnosis not present

## 2022-08-17 DIAGNOSIS — T8744 Infection of amputation stump, left lower extremity: Secondary | ICD-10-CM | POA: Diagnosis not present

## 2022-08-17 DIAGNOSIS — I5030 Unspecified diastolic (congestive) heart failure: Secondary | ICD-10-CM | POA: Diagnosis not present

## 2022-08-17 DIAGNOSIS — D472 Monoclonal gammopathy: Secondary | ICD-10-CM | POA: Diagnosis not present

## 2022-08-17 DIAGNOSIS — I120 Hypertensive chronic kidney disease with stage 5 chronic kidney disease or end stage renal disease: Secondary | ICD-10-CM | POA: Diagnosis not present

## 2022-08-17 DIAGNOSIS — D631 Anemia in chronic kidney disease: Secondary | ICD-10-CM | POA: Diagnosis not present

## 2022-08-17 DIAGNOSIS — A419 Sepsis, unspecified organism: Secondary | ICD-10-CM | POA: Diagnosis not present

## 2022-08-17 DIAGNOSIS — S12200A Unspecified displaced fracture of third cervical vertebra, initial encounter for closed fracture: Secondary | ICD-10-CM | POA: Diagnosis not present

## 2022-08-17 DIAGNOSIS — T82868A Thrombosis of vascular prosthetic devices, implants and grafts, initial encounter: Secondary | ICD-10-CM | POA: Diagnosis not present

## 2022-08-17 DIAGNOSIS — L0390 Cellulitis, unspecified: Secondary | ICD-10-CM | POA: Diagnosis not present

## 2022-08-17 DIAGNOSIS — M1990 Unspecified osteoarthritis, unspecified site: Secondary | ICD-10-CM | POA: Diagnosis not present

## 2022-08-17 DIAGNOSIS — S81801A Unspecified open wound, right lower leg, initial encounter: Secondary | ICD-10-CM | POA: Diagnosis not present

## 2022-08-18 DIAGNOSIS — M438X9 Other specified deforming dorsopathies, site unspecified: Secondary | ICD-10-CM | POA: Diagnosis not present

## 2022-08-18 DIAGNOSIS — R410 Disorientation, unspecified: Secondary | ICD-10-CM | POA: Diagnosis not present

## 2022-08-18 DIAGNOSIS — M5031 Other cervical disc degeneration,  high cervical region: Secondary | ICD-10-CM | POA: Diagnosis not present

## 2022-08-18 DIAGNOSIS — E1122 Type 2 diabetes mellitus with diabetic chronic kidney disease: Secondary | ICD-10-CM | POA: Diagnosis not present

## 2022-08-18 DIAGNOSIS — D509 Iron deficiency anemia, unspecified: Secondary | ICD-10-CM | POA: Diagnosis not present

## 2022-08-18 DIAGNOSIS — I5030 Unspecified diastolic (congestive) heart failure: Secondary | ICD-10-CM | POA: Diagnosis not present

## 2022-08-18 DIAGNOSIS — G9589 Other specified diseases of spinal cord: Secondary | ICD-10-CM | POA: Diagnosis not present

## 2022-08-18 DIAGNOSIS — S12200A Unspecified displaced fracture of third cervical vertebra, initial encounter for closed fracture: Secondary | ICD-10-CM | POA: Diagnosis not present

## 2022-08-18 DIAGNOSIS — Z992 Dependence on renal dialysis: Secondary | ICD-10-CM | POA: Diagnosis not present

## 2022-08-18 DIAGNOSIS — N186 End stage renal disease: Secondary | ICD-10-CM | POA: Diagnosis not present

## 2022-08-18 DIAGNOSIS — R197 Diarrhea, unspecified: Secondary | ICD-10-CM | POA: Diagnosis not present

## 2022-08-18 DIAGNOSIS — I132 Hypertensive heart and chronic kidney disease with heart failure and with stage 5 chronic kidney disease, or end stage renal disease: Secondary | ICD-10-CM | POA: Diagnosis not present

## 2022-08-18 DIAGNOSIS — D638 Anemia in other chronic diseases classified elsewhere: Secondary | ICD-10-CM | POA: Diagnosis not present

## 2022-08-19 DIAGNOSIS — D509 Iron deficiency anemia, unspecified: Secondary | ICD-10-CM | POA: Diagnosis not present

## 2022-08-19 DIAGNOSIS — S12200A Unspecified displaced fracture of third cervical vertebra, initial encounter for closed fracture: Secondary | ICD-10-CM | POA: Diagnosis not present

## 2022-08-19 DIAGNOSIS — D472 Monoclonal gammopathy: Secondary | ICD-10-CM | POA: Diagnosis not present

## 2022-08-19 DIAGNOSIS — E1122 Type 2 diabetes mellitus with diabetic chronic kidney disease: Secondary | ICD-10-CM | POA: Diagnosis not present

## 2022-08-19 DIAGNOSIS — D638 Anemia in other chronic diseases classified elsewhere: Secondary | ICD-10-CM | POA: Diagnosis not present

## 2022-08-19 DIAGNOSIS — D631 Anemia in chronic kidney disease: Secondary | ICD-10-CM | POA: Diagnosis not present

## 2022-08-19 DIAGNOSIS — M5031 Other cervical disc degeneration,  high cervical region: Secondary | ICD-10-CM | POA: Diagnosis not present

## 2022-08-19 DIAGNOSIS — N186 End stage renal disease: Secondary | ICD-10-CM | POA: Diagnosis not present

## 2022-08-19 DIAGNOSIS — G9589 Other specified diseases of spinal cord: Secondary | ICD-10-CM | POA: Diagnosis not present

## 2022-08-19 DIAGNOSIS — I5030 Unspecified diastolic (congestive) heart failure: Secondary | ICD-10-CM | POA: Diagnosis not present

## 2022-08-19 DIAGNOSIS — Z79899 Other long term (current) drug therapy: Secondary | ICD-10-CM | POA: Diagnosis not present

## 2022-08-19 DIAGNOSIS — E785 Hyperlipidemia, unspecified: Secondary | ICD-10-CM | POA: Diagnosis not present

## 2022-08-19 DIAGNOSIS — I132 Hypertensive heart and chronic kidney disease with heart failure and with stage 5 chronic kidney disease, or end stage renal disease: Secondary | ICD-10-CM | POA: Diagnosis not present

## 2022-08-19 DIAGNOSIS — I509 Heart failure, unspecified: Secondary | ICD-10-CM | POA: Diagnosis not present

## 2022-08-19 DIAGNOSIS — Z992 Dependence on renal dialysis: Secondary | ICD-10-CM | POA: Diagnosis not present

## 2022-08-20 DIAGNOSIS — S12200A Unspecified displaced fracture of third cervical vertebra, initial encounter for closed fracture: Secondary | ICD-10-CM | POA: Diagnosis not present

## 2022-08-20 DIAGNOSIS — D509 Iron deficiency anemia, unspecified: Secondary | ICD-10-CM | POA: Diagnosis not present

## 2022-08-20 DIAGNOSIS — W19XXXA Unspecified fall, initial encounter: Secondary | ICD-10-CM | POA: Diagnosis not present

## 2022-08-20 DIAGNOSIS — M8610 Other acute osteomyelitis, unspecified site: Secondary | ICD-10-CM | POA: Diagnosis not present

## 2022-08-20 DIAGNOSIS — T82868A Thrombosis of vascular prosthetic devices, implants and grafts, initial encounter: Secondary | ICD-10-CM | POA: Diagnosis not present

## 2022-08-20 DIAGNOSIS — M5031 Other cervical disc degeneration,  high cervical region: Secondary | ICD-10-CM | POA: Diagnosis not present

## 2022-08-20 DIAGNOSIS — M4802 Spinal stenosis, cervical region: Secondary | ICD-10-CM | POA: Diagnosis not present

## 2022-08-20 DIAGNOSIS — Z89512 Acquired absence of left leg below knee: Secondary | ICD-10-CM | POA: Diagnosis not present

## 2022-08-20 DIAGNOSIS — N186 End stage renal disease: Secondary | ICD-10-CM | POA: Diagnosis not present

## 2022-08-20 DIAGNOSIS — Z992 Dependence on renal dialysis: Secondary | ICD-10-CM | POA: Diagnosis not present

## 2022-08-20 DIAGNOSIS — Z743 Need for continuous supervision: Secondary | ICD-10-CM | POA: Diagnosis not present

## 2022-08-20 DIAGNOSIS — S14152A Other incomplete lesion at C2 level of cervical spinal cord, initial encounter: Secondary | ICD-10-CM | POA: Diagnosis not present

## 2022-08-20 DIAGNOSIS — R404 Transient alteration of awareness: Secondary | ICD-10-CM | POA: Diagnosis not present

## 2022-08-20 DIAGNOSIS — S32049A Unspecified fracture of fourth lumbar vertebra, initial encounter for closed fracture: Secondary | ICD-10-CM | POA: Diagnosis not present

## 2022-08-20 DIAGNOSIS — D638 Anemia in other chronic diseases classified elsewhere: Secondary | ICD-10-CM | POA: Diagnosis not present

## 2022-08-20 DIAGNOSIS — M4646 Discitis, unspecified, lumbar region: Secondary | ICD-10-CM | POA: Diagnosis not present

## 2022-08-20 DIAGNOSIS — R6889 Other general symptoms and signs: Secondary | ICD-10-CM | POA: Diagnosis not present

## 2022-08-20 DIAGNOSIS — E785 Hyperlipidemia, unspecified: Secondary | ICD-10-CM | POA: Diagnosis not present

## 2022-08-20 DIAGNOSIS — I5020 Unspecified systolic (congestive) heart failure: Secondary | ICD-10-CM | POA: Diagnosis not present

## 2022-08-20 DIAGNOSIS — E119 Type 2 diabetes mellitus without complications: Secondary | ICD-10-CM | POA: Diagnosis not present

## 2022-08-20 DIAGNOSIS — D649 Anemia, unspecified: Secondary | ICD-10-CM | POA: Diagnosis not present

## 2022-08-20 DIAGNOSIS — S12200D Unspecified displaced fracture of third cervical vertebra, subsequent encounter for fracture with routine healing: Secondary | ICD-10-CM | POA: Diagnosis not present

## 2022-08-25 ENCOUNTER — Telehealth: Payer: Self-pay | Admitting: Internal Medicine
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
## 2022-08-25 NOTE — Telephone Encounter (Signed)
Crystal Hood from [facility] is calling to see if patients PCP can sign his death certificate.  Please advise [phone]

## 2022-08-26 NOTE — Telephone Encounter (Signed)
08/26/2022: I was able to speak with Crystal from the funeral home.  I inquired whether the case was referred to the medical examiner.  She told me that it was not.  She confirmed his time of death is being 4:39 PM.

## 2022-08-30 DEATH — deceased

## 2022-09-15 ENCOUNTER — Ambulatory Visit: Payer: Medicare Other | Admitting: Hematology

## 2022-09-15 ENCOUNTER — Other Ambulatory Visit: Payer: Medicare Other

## 2023-06-23 IMAGING — CT CT ABD-PELV W/O CM
2 of 4 series · 17 of 46 positions shown, 19 images · non-contrast
Comparison: None.

CLINICAL DATA: Abdominal pain.  Dialysis patient.  Hematuria

EXAM:
CT ABDOMEN AND PELVIS WITHOUT CONTRAST
TECHNIQUE: Multidetector CT imaging of the abdomen and pelvis was performed
following the standard protocol without IV contrast.

[Series 2: axial st · axial · 0.98mm/px · z∈[+924,+1349]mm · 14 of 95 slices shown, 16 images]
[im 5/95  soft-tissue]
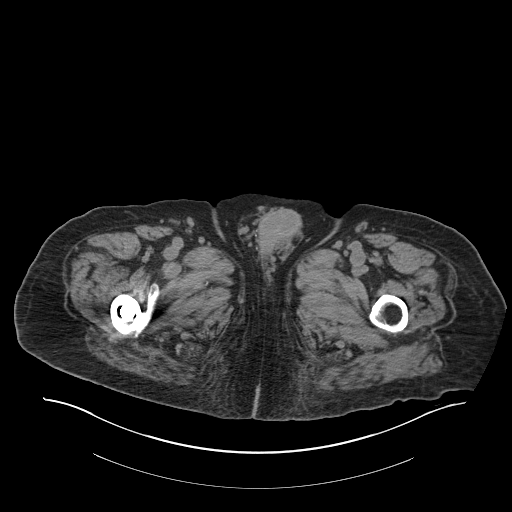
[im 5/95  bone]
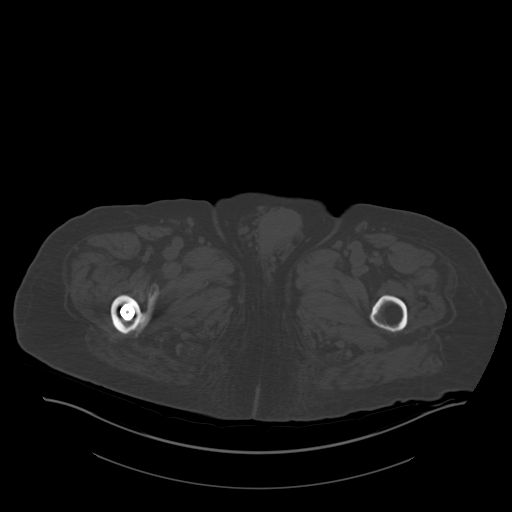
[im 14/95  soft-tissue]
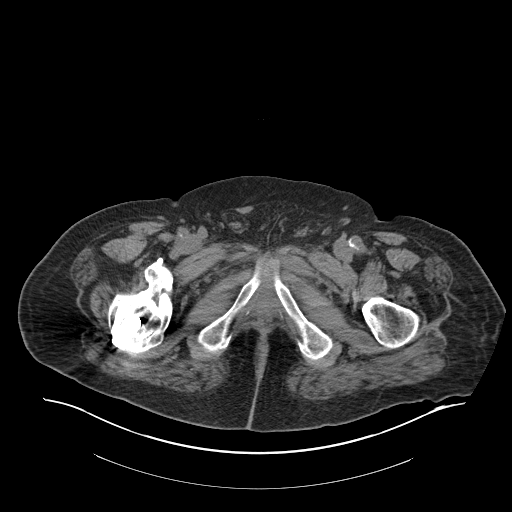
[im 18/95  soft-tissue]
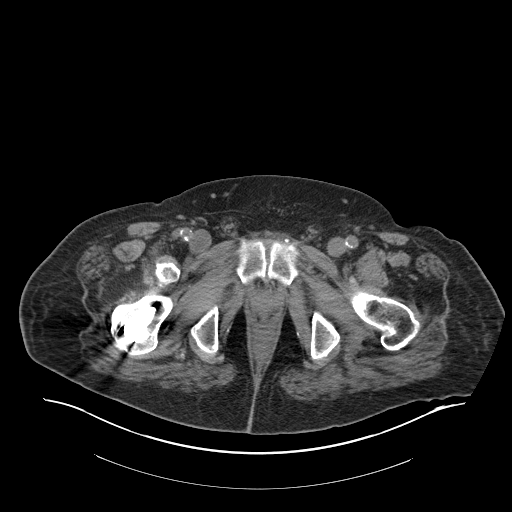
[im 27/95  soft-tissue]
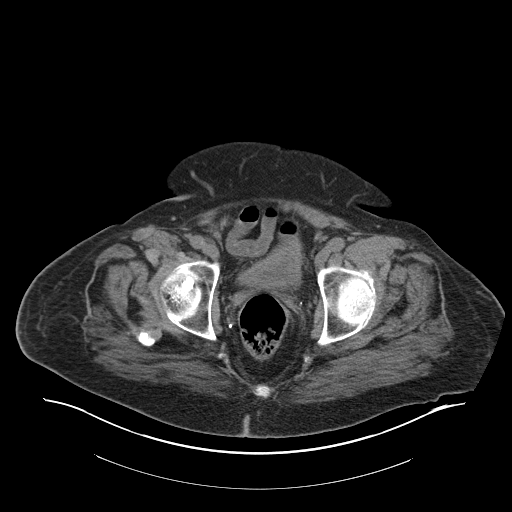
[im 32/95  soft-tissue]
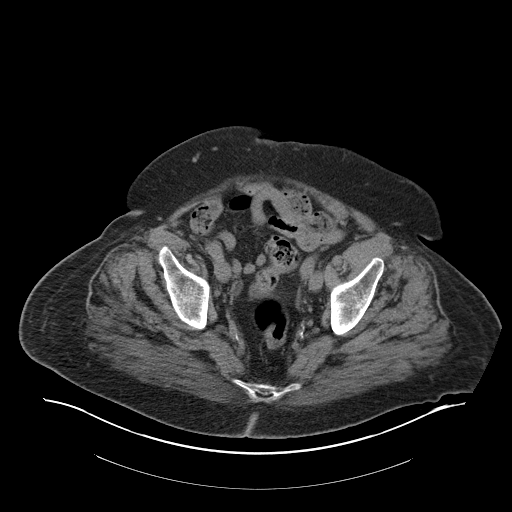
[im 36/95  soft-tissue]
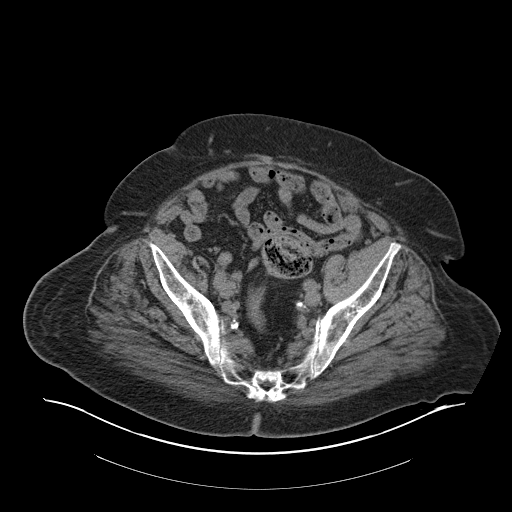
[im 45/95  soft-tissue]
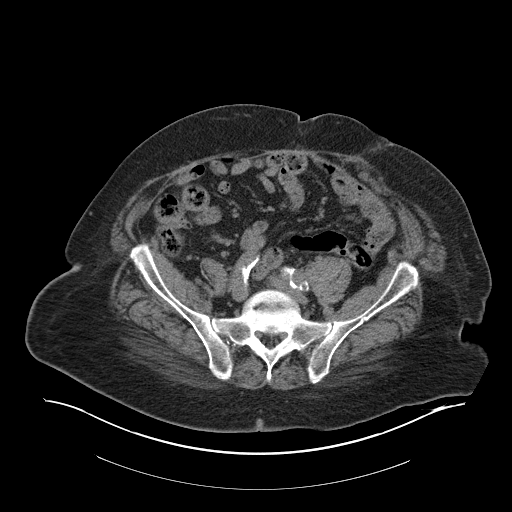
[im 50/95  soft-tissue]
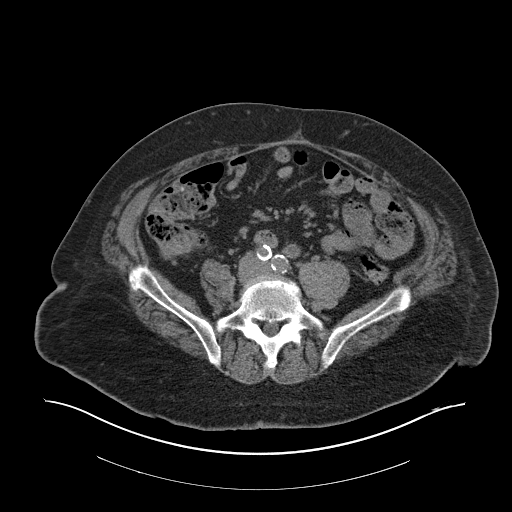
[im 59/95  soft-tissue]
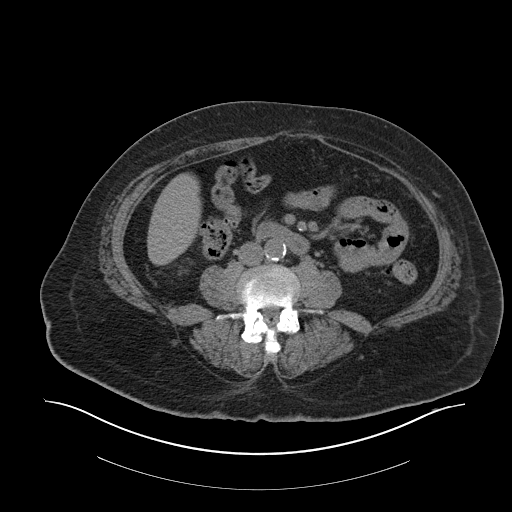
[im 59/95  bone]
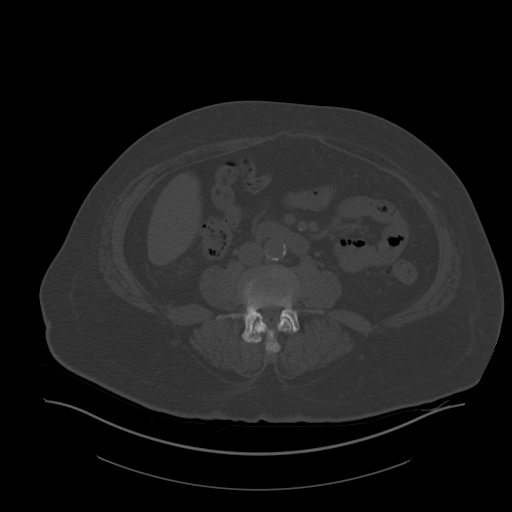
[im 63/95  soft-tissue]
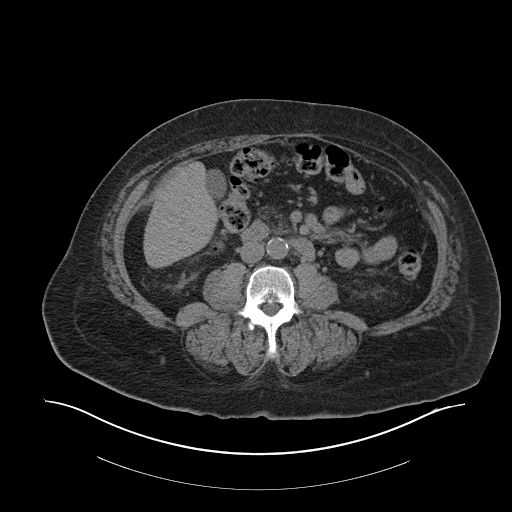
[im 72/95  soft-tissue]
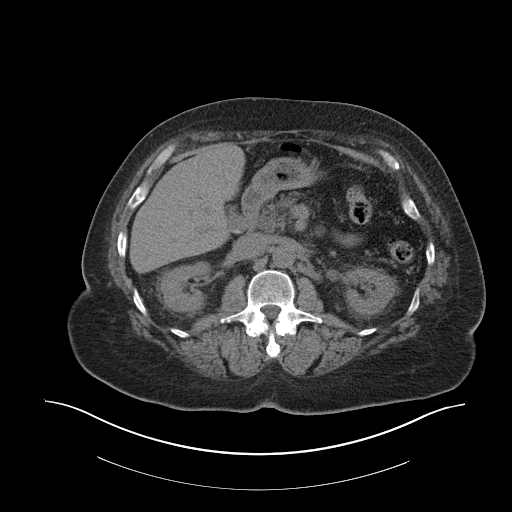
[im 77/95  soft-tissue]
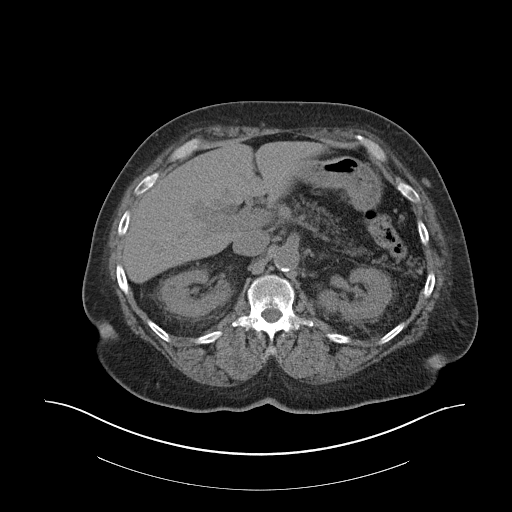
[im 81/95  soft-tissue]
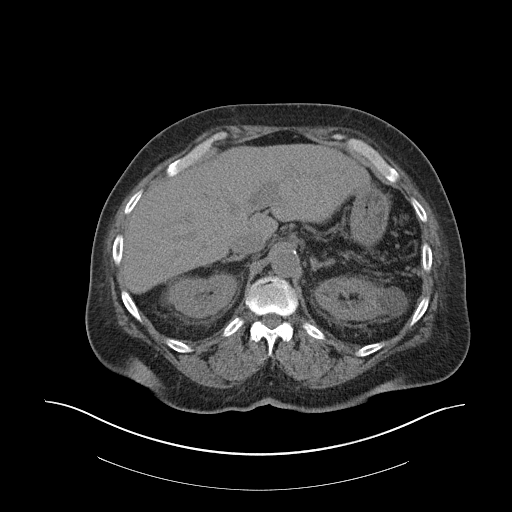
[im 90/95  soft-tissue]
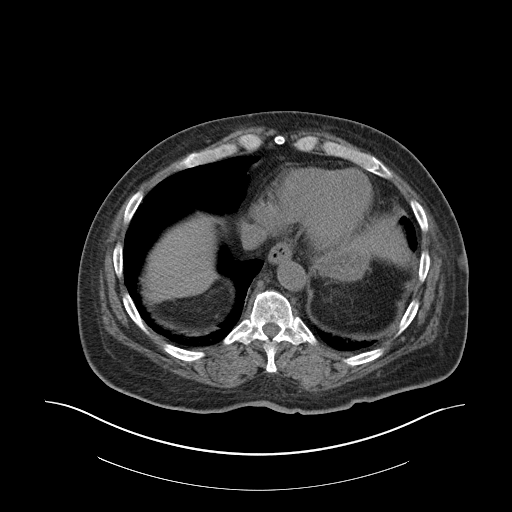

[Series 5: coronal st · coronal · 1.02mm/px · 3 of 185 slices shown]
[im 62/185  soft-tissue]
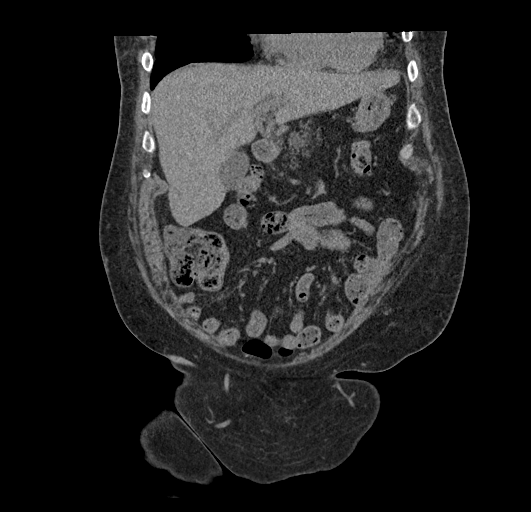
[im 82/185  soft-tissue]
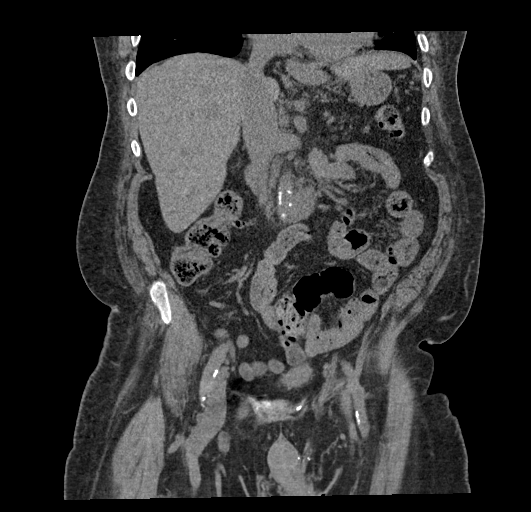
[im 103/185  soft-tissue]
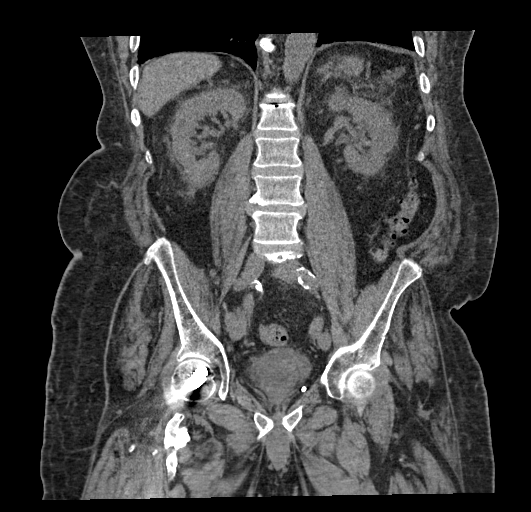

[17 of 46 positions shown; findings below may reference images not displayed]

FINDINGS: Lower chest: Lung bases are clear.

Hepatobiliary: No focal hepatic lesion. Sludge within the
gallbladder versus small stones.

Pancreas: Pancreas is normal. No ductal dilatation. No pancreatic
inflammation.

Spleen: Normal spleen

Adrenals/urinary tract: Adrenal glands normal. There is bilateral
perinephric stranding which appears chronic. No hydronephrosis.
Benign cyst of the LEFT kidney. No ureterolithiasis or obstructive
uropathy. No bladder calculi.

Stomach/Bowel: Stomach, small bowel, appendix, and cecum are normal.
The colon and rectosigmoid colon are normal.

Vascular/Lymphatic: Abdominal aorta is normal caliber with
atherosclerotic calcification. There is no retroperitoneal or
periportal lymphadenopathy. No pelvic lymphadenopathy.

Reproductive: Prostate unremarkable

Other: No free fluid

Musculoskeletal: No aggressive osseous lesion.
IMPRESSION: 1. No acute findings in the abdomen pelvis.
2. No explanation for hematuria.

## 2023-11-11 IMAGING — CR DG CHEST 2V
2 series · 2 of 2 positions shown · non-contrast
Comparison: December 31, 2016

CLINICAL DATA: Shortness of breath and cough.

EXAM:
CHEST - 2 VIEW

[w chest lat]
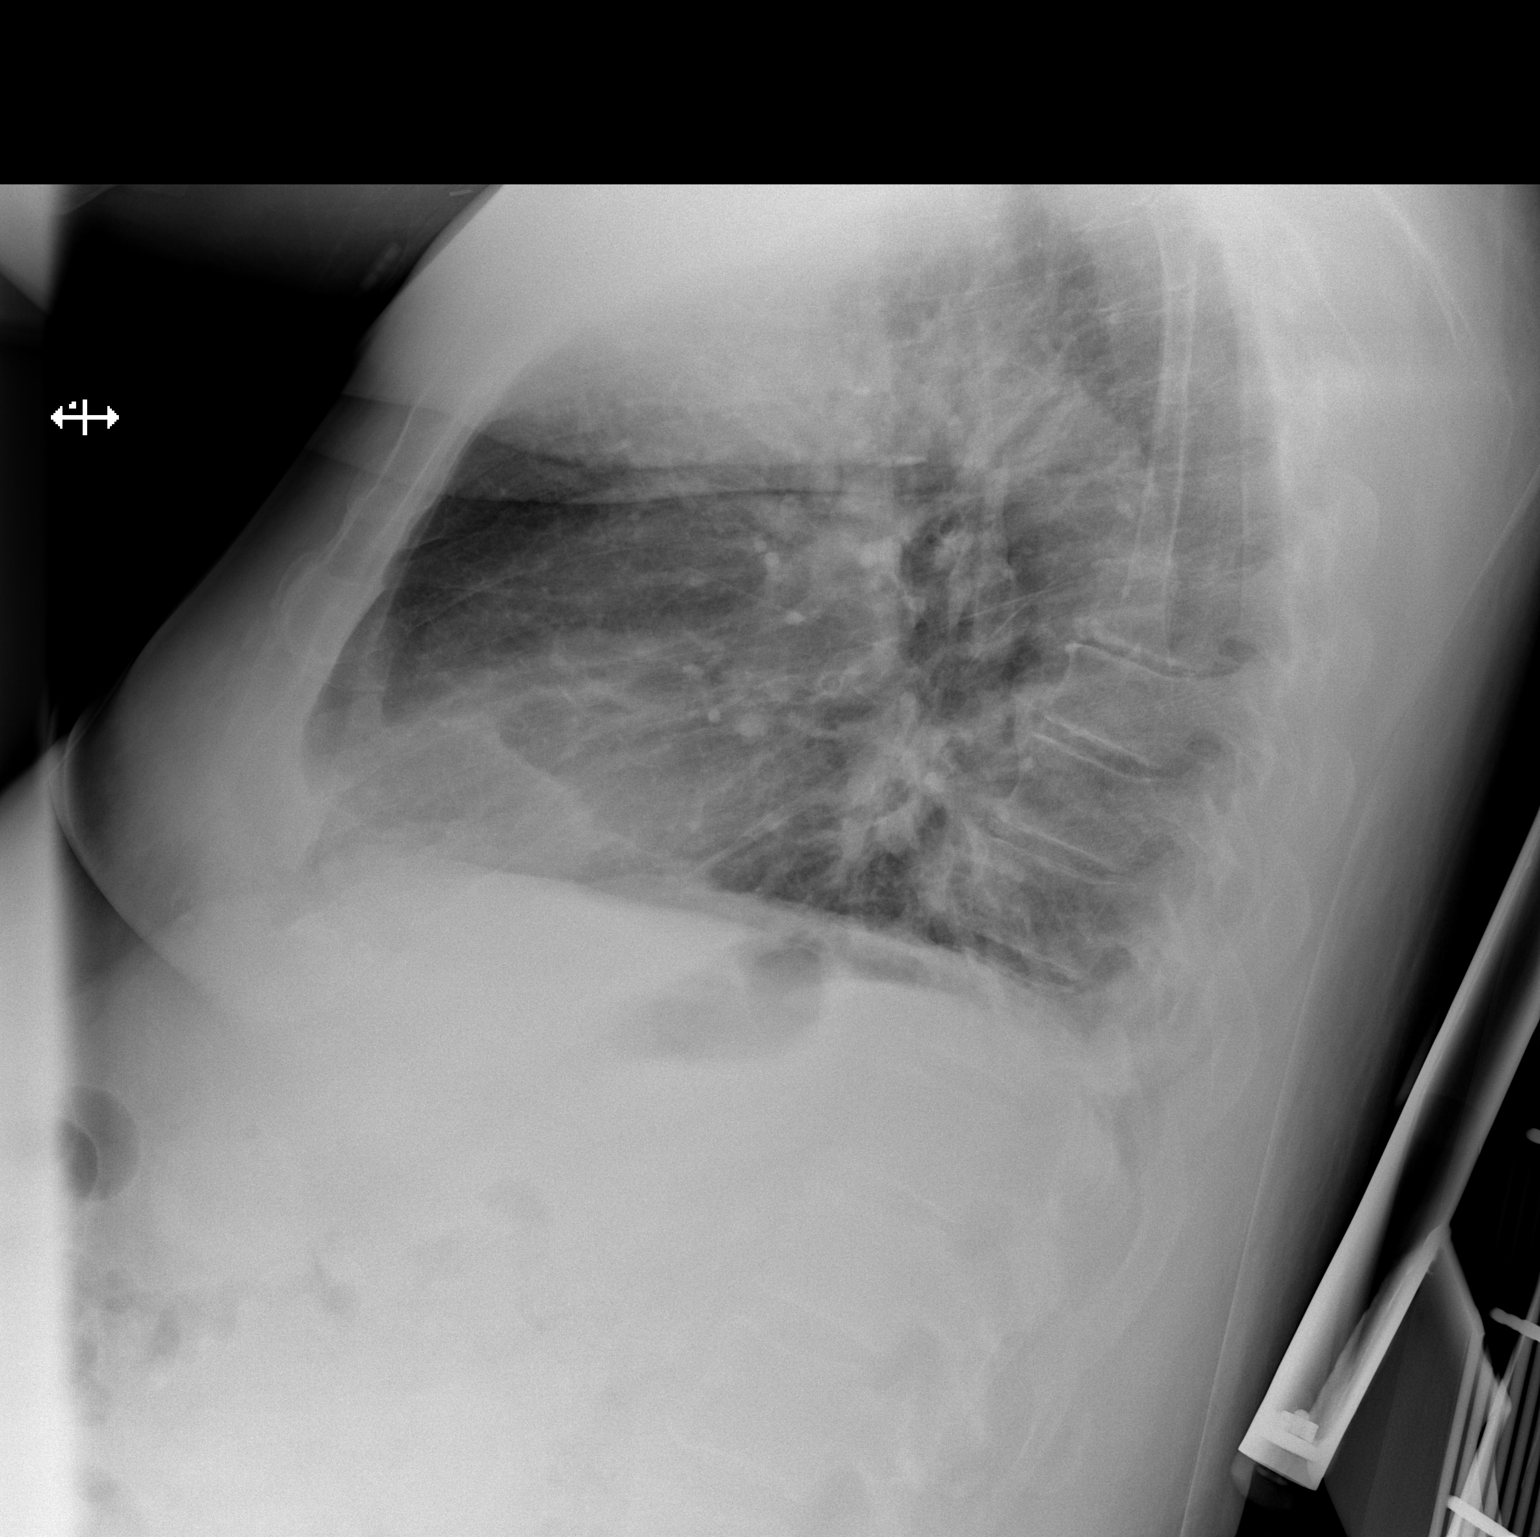

[x chest ap]
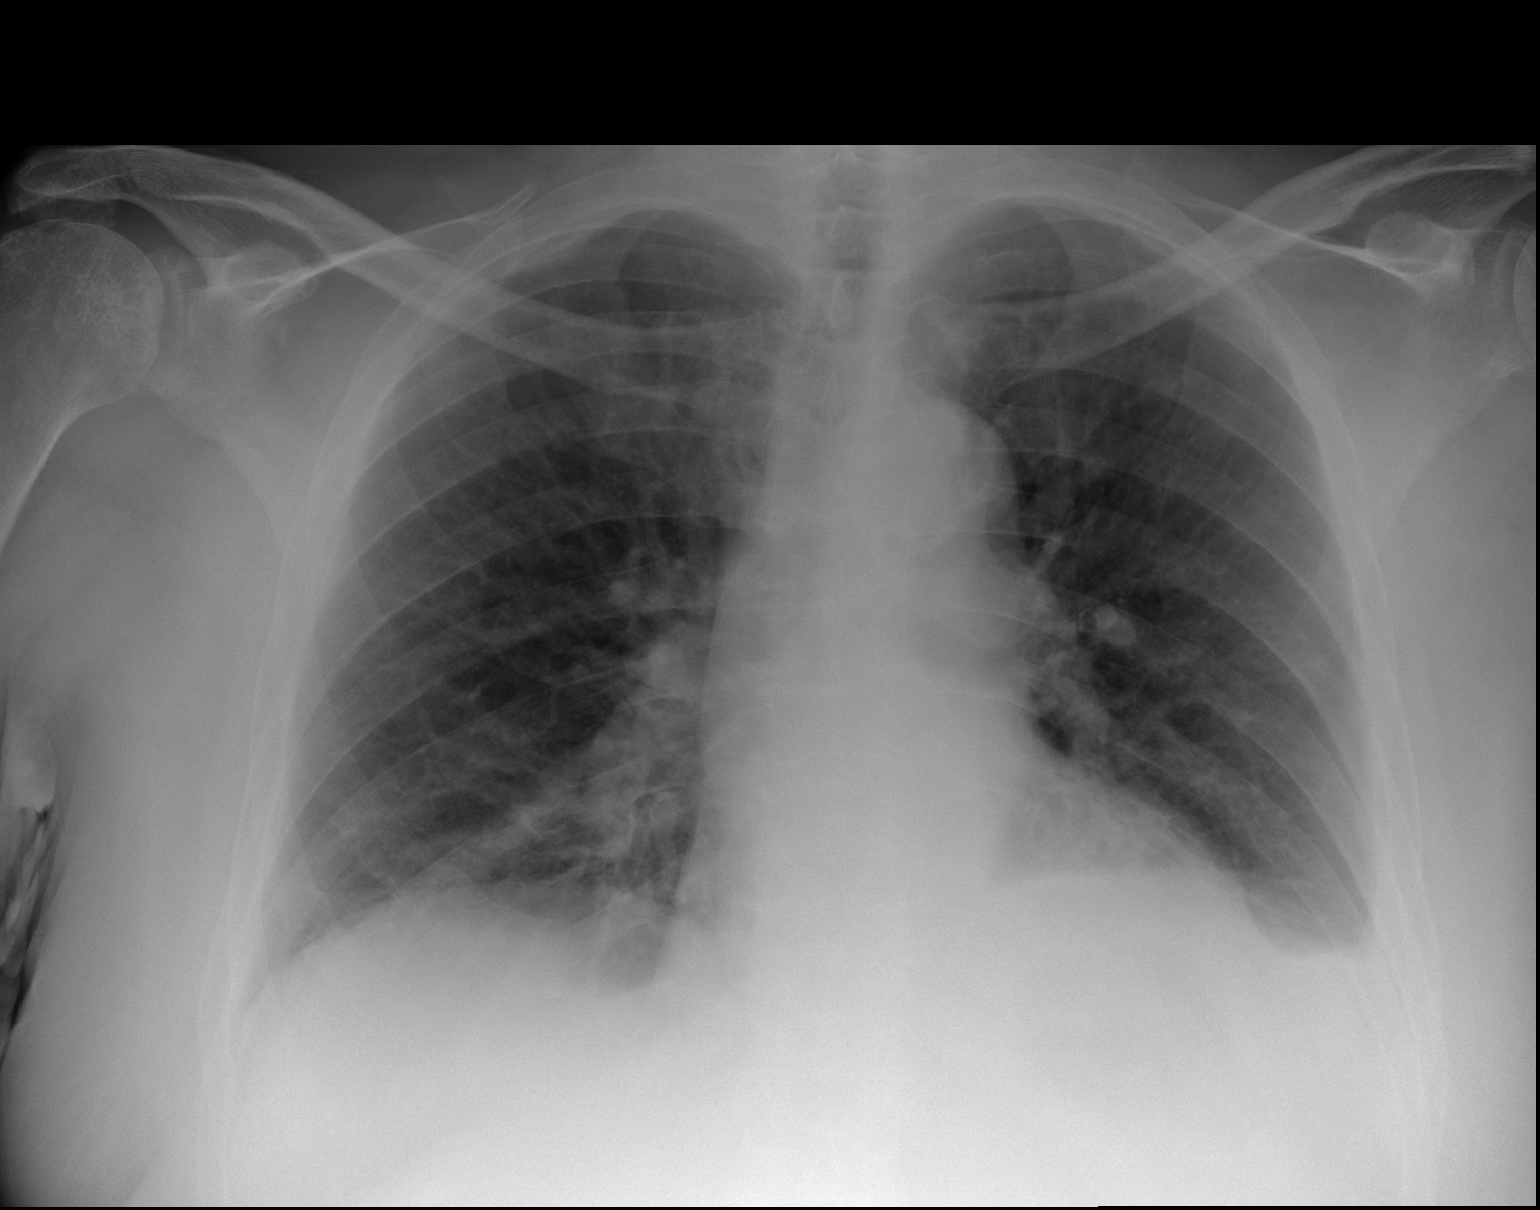

[2 of 2 positions shown; findings below may reference images not displayed]

FINDINGS: The heart size and mediastinal contours are within normal limits.
Mild patchy opacity of left lung base is noted. There is a small
left pleural effusion. The right lung is clear. The visualized
skeletal structures are unremarkable.
IMPRESSION: Mild patchy opacity of left lung base, suspicious for pneumonia with
small left pleural effusion.
